# Patient Record
Sex: Female | Born: 1950 | Race: White | Hispanic: No | Marital: Married | Smoking: Former smoker
Health system: Southern US, Community
[De-identification: ages and names within clinical notes are randomized; demographics above are authoritative.]

## PROBLEM LIST (undated history)

## (undated) DIAGNOSIS — R7989 Other specified abnormal findings of blood chemistry: Secondary | ICD-10-CM

## (undated) DIAGNOSIS — K439 Ventral hernia without obstruction or gangrene: Secondary | ICD-10-CM

## (undated) DIAGNOSIS — D649 Anemia, unspecified: Secondary | ICD-10-CM

## (undated) DIAGNOSIS — N1330 Unspecified hydronephrosis: Secondary | ICD-10-CM

## (undated) DIAGNOSIS — C55 Malignant neoplasm of uterus, part unspecified: Secondary | ICD-10-CM

## (undated) DIAGNOSIS — L409 Psoriasis, unspecified: Secondary | ICD-10-CM

## (undated) DIAGNOSIS — M109 Gout, unspecified: Secondary | ICD-10-CM

## (undated) DIAGNOSIS — I639 Cerebral infarction, unspecified: Secondary | ICD-10-CM

## (undated) DIAGNOSIS — Q2730 Arteriovenous malformation, site unspecified: Secondary | ICD-10-CM

## (undated) DIAGNOSIS — G459 Transient cerebral ischemic attack, unspecified: Secondary | ICD-10-CM

## (undated) DIAGNOSIS — I509 Heart failure, unspecified: Secondary | ICD-10-CM

## (undated) DIAGNOSIS — H269 Unspecified cataract: Secondary | ICD-10-CM

## (undated) DIAGNOSIS — R911 Solitary pulmonary nodule: Secondary | ICD-10-CM

## (undated) DIAGNOSIS — K458 Other specified abdominal hernia without obstruction or gangrene: Secondary | ICD-10-CM

## (undated) DIAGNOSIS — J189 Pneumonia, unspecified organism: Secondary | ICD-10-CM

## (undated) DIAGNOSIS — IMO0002 Reserved for concepts with insufficient information to code with codable children: Secondary | ICD-10-CM

## (undated) DIAGNOSIS — J449 Chronic obstructive pulmonary disease, unspecified: Secondary | ICD-10-CM

## (undated) DIAGNOSIS — K219 Gastro-esophageal reflux disease without esophagitis: Secondary | ICD-10-CM

## (undated) DIAGNOSIS — G629 Polyneuropathy, unspecified: Secondary | ICD-10-CM

## (undated) DIAGNOSIS — I2510 Atherosclerotic heart disease of native coronary artery without angina pectoris: Secondary | ICD-10-CM

## (undated) DIAGNOSIS — E041 Nontoxic single thyroid nodule: Secondary | ICD-10-CM

## (undated) DIAGNOSIS — G4700 Insomnia, unspecified: Secondary | ICD-10-CM

## (undated) DIAGNOSIS — N3281 Overactive bladder: Secondary | ICD-10-CM

## (undated) DIAGNOSIS — M5136 Other intervertebral disc degeneration, lumbar region: Secondary | ICD-10-CM

## (undated) DIAGNOSIS — M51369 Other intervertebral disc degeneration, lumbar region without mention of lumbar back pain or lower extremity pain: Secondary | ICD-10-CM

## (undated) DIAGNOSIS — E785 Hyperlipidemia, unspecified: Secondary | ICD-10-CM

## (undated) DIAGNOSIS — F329 Major depressive disorder, single episode, unspecified: Secondary | ICD-10-CM

## (undated) DIAGNOSIS — C541 Malignant neoplasm of endometrium: Secondary | ICD-10-CM

## (undated) DIAGNOSIS — I82409 Acute embolism and thrombosis of unspecified deep veins of unspecified lower extremity: Secondary | ICD-10-CM

## (undated) DIAGNOSIS — I739 Peripheral vascular disease, unspecified: Secondary | ICD-10-CM

## (undated) DIAGNOSIS — IMO0001 Reserved for inherently not codable concepts without codable children: Secondary | ICD-10-CM

## (undated) DIAGNOSIS — M21379 Foot drop, unspecified foot: Secondary | ICD-10-CM

## (undated) DIAGNOSIS — F419 Anxiety disorder, unspecified: Secondary | ICD-10-CM

## (undated) DIAGNOSIS — D126 Benign neoplasm of colon, unspecified: Secondary | ICD-10-CM

## (undated) DIAGNOSIS — I6529 Occlusion and stenosis of unspecified carotid artery: Secondary | ICD-10-CM

## (undated) DIAGNOSIS — M1990 Unspecified osteoarthritis, unspecified site: Secondary | ICD-10-CM

## (undated) DIAGNOSIS — R05 Cough: Secondary | ICD-10-CM

## (undated) DIAGNOSIS — R059 Cough, unspecified: Secondary | ICD-10-CM

## (undated) DIAGNOSIS — R945 Abnormal results of liver function studies: Secondary | ICD-10-CM

## (undated) DIAGNOSIS — I10 Essential (primary) hypertension: Secondary | ICD-10-CM

## (undated) DIAGNOSIS — F32A Depression, unspecified: Secondary | ICD-10-CM

## (undated) HISTORY — DX: Cerebral infarction, unspecified: I63.9

## (undated) HISTORY — DX: Hyperlipidemia, unspecified: E78.5

## (undated) HISTORY — DX: Atherosclerotic heart disease of native coronary artery without angina pectoris: I25.10

## (undated) HISTORY — DX: Anxiety disorder, unspecified: F41.9

## (undated) HISTORY — DX: Unspecified osteoarthritis, unspecified site: M19.90

## (undated) HISTORY — DX: Gastro-esophageal reflux disease without esophagitis: K21.9

## (undated) HISTORY — DX: Benign neoplasm of colon, unspecified: D12.6

## (undated) HISTORY — DX: Overactive bladder: N32.81

## (undated) HISTORY — DX: Unspecified hydronephrosis: N13.30

## (undated) HISTORY — PX: BREAST BIOPSY: SHX20

## (undated) HISTORY — DX: Nontoxic single thyroid nodule: E04.1

## (undated) HISTORY — DX: Depression, unspecified: F32.A

## (undated) HISTORY — DX: Transient cerebral ischemic attack, unspecified: G45.9

## (undated) HISTORY — DX: Chronic obstructive pulmonary disease, unspecified: J44.9

## (undated) HISTORY — DX: Psoriasis, unspecified: L40.9

## (undated) HISTORY — PX: SPINE SURGERY: SHX786

## (undated) HISTORY — DX: Ventral hernia without obstruction or gangrene: K43.9

## (undated) HISTORY — DX: Anemia, unspecified: D64.9

## (undated) HISTORY — DX: Other specified abdominal hernia without obstruction or gangrene: K45.8

## (undated) HISTORY — PX: CHOLECYSTECTOMY: SHX55

## (undated) HISTORY — DX: Occlusion and stenosis of unspecified carotid artery: I65.29

## (undated) HISTORY — DX: Abnormal results of liver function studies: R94.5

## (undated) HISTORY — DX: Reserved for concepts with insufficient information to code with codable children: IMO0002

## (undated) HISTORY — DX: Other intervertebral disc degeneration, lumbar region without mention of lumbar back pain or lower extremity pain: M51.369

## (undated) HISTORY — DX: Gout, unspecified: M10.9

## (undated) HISTORY — PX: BACK SURGERY: SHX140

## (undated) HISTORY — DX: Arteriovenous malformation, site unspecified: Q27.30

## (undated) HISTORY — DX: Other specified abnormal findings of blood chemistry: R79.89

## (undated) HISTORY — DX: Unspecified cataract: H26.9

## (undated) HISTORY — DX: Polyneuropathy, unspecified: G62.9

## (undated) HISTORY — DX: Insomnia, unspecified: G47.00

## (undated) HISTORY — DX: Other intervertebral disc degeneration, lumbar region: M51.36

## (undated) HISTORY — DX: Malignant neoplasm of uterus, part unspecified: C55

## (undated) HISTORY — DX: Solitary pulmonary nodule: R91.1

## (undated) HISTORY — DX: Reserved for inherently not codable concepts without codable children: IMO0001

## (undated) HISTORY — DX: Major depressive disorder, single episode, unspecified: F32.9

## (undated) HISTORY — PX: OTHER SURGICAL HISTORY: SHX169

---

## 1999-05-03 ENCOUNTER — Other Ambulatory Visit: Admission: RE | Admit: 1999-05-03 | Discharge: 1999-05-03 | Payer: Self-pay | Admitting: Family Medicine

## 2000-03-24 ENCOUNTER — Encounter: Admission: RE | Admit: 2000-03-24 | Discharge: 2000-03-24 | Payer: Self-pay | Admitting: Family Medicine

## 2000-03-24 ENCOUNTER — Encounter: Payer: Self-pay | Admitting: Family Medicine

## 2000-08-11 ENCOUNTER — Other Ambulatory Visit: Admission: RE | Admit: 2000-08-11 | Discharge: 2000-08-11 | Payer: Self-pay | Admitting: Family Medicine

## 2000-11-03 ENCOUNTER — Encounter: Payer: Self-pay | Admitting: Family Medicine

## 2000-11-03 ENCOUNTER — Encounter: Admission: RE | Admit: 2000-11-03 | Discharge: 2000-11-03 | Payer: Self-pay | Admitting: Family Medicine

## 2002-01-27 ENCOUNTER — Other Ambulatory Visit: Admission: RE | Admit: 2002-01-27 | Discharge: 2002-01-27 | Payer: Self-pay | Admitting: Family Medicine

## 2006-03-16 ENCOUNTER — Encounter: Admission: RE | Admit: 2006-03-16 | Discharge: 2006-03-16 | Payer: Self-pay | Admitting: Family Medicine

## 2007-12-30 HISTORY — PX: ABDOMINAL HYSTERECTOMY: SHX81

## 2008-03-31 ENCOUNTER — Other Ambulatory Visit: Admission: RE | Admit: 2008-03-31 | Discharge: 2008-03-31 | Payer: Self-pay | Admitting: Gynecology

## 2008-04-18 ENCOUNTER — Ambulatory Visit: Admission: RE | Admit: 2008-04-18 | Discharge: 2008-04-18 | Payer: Self-pay | Admitting: Gynecologic Oncology

## 2008-05-02 ENCOUNTER — Encounter: Payer: Self-pay | Admitting: Gynecologic Oncology

## 2008-05-02 ENCOUNTER — Inpatient Hospital Stay (HOSPITAL_COMMUNITY): Admission: AD | Admit: 2008-05-02 | Discharge: 2008-05-07 | Payer: Self-pay | Admitting: Obstetrics & Gynecology

## 2008-05-02 ENCOUNTER — Encounter: Payer: Self-pay | Admitting: Obstetrics & Gynecology

## 2008-05-09 ENCOUNTER — Ambulatory Visit: Admission: RE | Admit: 2008-05-09 | Discharge: 2008-05-09 | Payer: Self-pay | Admitting: Gynecology

## 2008-07-19 ENCOUNTER — Ambulatory Visit (HOSPITAL_COMMUNITY): Admission: RE | Admit: 2008-07-19 | Discharge: 2008-07-19 | Payer: Self-pay | Admitting: Gynecology

## 2008-08-01 ENCOUNTER — Ambulatory Visit: Admission: RE | Admit: 2008-08-01 | Discharge: 2008-08-01 | Payer: Self-pay | Admitting: Gynecologic Oncology

## 2008-08-01 ENCOUNTER — Ambulatory Visit: Payer: Self-pay | Admitting: Oncology

## 2008-08-18 LAB — WHOLE BLOOD GLUCOSE: HRS PC: 3 Hours

## 2008-08-25 LAB — CBC WITH DIFFERENTIAL/PLATELET
BASO%: 0.7 % (ref 0.0–2.0)
EOS%: 2.6 % (ref 0.0–7.0)
LYMPH%: 17.7 % (ref 14.0–48.0)
MCH: 29 pg (ref 26.0–34.0)
MCHC: 33.8 g/dL (ref 32.0–36.0)
MONO#: 0.2 10*3/uL (ref 0.1–0.9)
RBC: 4.62 10*6/uL (ref 3.70–5.32)
WBC: 5.7 10*3/uL (ref 3.9–10.0)
lymph#: 1 10*3/uL (ref 0.9–3.3)

## 2008-09-01 LAB — CBC WITH DIFFERENTIAL/PLATELET
Basophils Absolute: 0.1 10*3/uL (ref 0.0–0.1)
Eosinophils Absolute: 0.2 10*3/uL (ref 0.0–0.5)
HCT: 38.3 % (ref 34.8–46.6)
LYMPH%: 26.1 % (ref 14.0–48.0)
MONO#: 0.9 10*3/uL (ref 0.1–0.9)
NEUT#: 2.5 10*3/uL (ref 1.5–6.5)
NEUT%: 51.1 % (ref 39.6–76.8)
Platelets: 417 10*3/uL — ABNORMAL HIGH (ref 145–400)
WBC: 4.9 10*3/uL (ref 3.9–10.0)

## 2008-09-07 LAB — CBC WITH DIFFERENTIAL/PLATELET
BASO%: 0.4 % (ref 0.0–2.0)
EOS%: 1.1 % (ref 0.0–7.0)
HCT: 37.6 % (ref 34.8–46.6)
LYMPH%: 13.1 % — ABNORMAL LOW (ref 14.0–48.0)
MCH: 29.1 pg (ref 26.0–34.0)
MCHC: 33.9 g/dL (ref 32.0–36.0)
MCV: 86 fL (ref 81.0–101.0)
MONO#: 0.8 10*3/uL (ref 0.1–0.9)
NEUT%: 76.9 % — ABNORMAL HIGH (ref 39.6–76.8)
Platelets: 413 10*3/uL — ABNORMAL HIGH (ref 145–400)

## 2008-09-07 LAB — COMPREHENSIVE METABOLIC PANEL
ALT: 21 U/L (ref 0–35)
CO2: 26 mEq/L (ref 19–32)
Creatinine, Ser: 0.79 mg/dL (ref 0.40–1.20)
Total Bilirubin: 0.2 mg/dL — ABNORMAL LOW (ref 0.3–1.2)

## 2008-09-08 LAB — WHOLE BLOOD GLUCOSE: Glucose: 329 mg/dL — ABNORMAL HIGH (ref 70–100)

## 2008-09-15 ENCOUNTER — Ambulatory Visit: Payer: Self-pay | Admitting: Oncology

## 2008-09-19 LAB — CBC WITH DIFFERENTIAL/PLATELET
Basophils Absolute: 0 10*3/uL (ref 0.0–0.1)
Eosinophils Absolute: 0.1 10*3/uL (ref 0.0–0.5)
HGB: 13 g/dL (ref 11.6–15.9)
MCV: 85.9 fL (ref 81.0–101.0)
MONO#: 0.8 10*3/uL (ref 0.1–0.9)
MONO%: 12.1 % (ref 0.0–13.0)
NEUT#: 4.3 10*3/uL (ref 1.5–6.5)
RDW: 15.9 % — ABNORMAL HIGH (ref 11.3–14.5)
lymph#: 1.2 10*3/uL (ref 0.9–3.3)

## 2008-09-19 LAB — COMPREHENSIVE METABOLIC PANEL
Albumin: 4.2 g/dL (ref 3.5–5.2)
BUN: 22 mg/dL (ref 6–23)
CO2: 26 mEq/L (ref 19–32)
Calcium: 9.3 mg/dL (ref 8.4–10.5)
Chloride: 101 mEq/L (ref 96–112)
Glucose, Bld: 235 mg/dL — ABNORMAL HIGH (ref 70–99)
Potassium: 3.9 mEq/L (ref 3.5–5.3)

## 2008-10-02 LAB — CBC WITH DIFFERENTIAL/PLATELET
Basophils Absolute: 0 10*3/uL (ref 0.0–0.1)
EOS%: 0.1 % (ref 0.0–7.0)
Eosinophils Absolute: 0 10*3/uL (ref 0.0–0.5)
LYMPH%: 8.3 % — ABNORMAL LOW (ref 14.0–48.0)
MCH: 28.6 pg (ref 26.0–34.0)
MCV: 84.6 fL (ref 81.0–101.0)
MONO%: 0.5 % (ref 0.0–13.0)
NEUT#: 10.2 10*3/uL — ABNORMAL HIGH (ref 1.5–6.5)
Platelets: 487 10*3/uL — ABNORMAL HIGH (ref 145–400)
RBC: 4.55 10*6/uL (ref 3.70–5.32)

## 2008-10-20 ENCOUNTER — Ambulatory Visit (HOSPITAL_COMMUNITY): Admission: RE | Admit: 2008-10-20 | Discharge: 2008-10-20 | Payer: Self-pay | Admitting: Oncology

## 2008-10-24 ENCOUNTER — Ambulatory Visit: Admission: RE | Admit: 2008-10-24 | Discharge: 2008-10-24 | Payer: Self-pay | Admitting: Gynecologic Oncology

## 2008-10-27 ENCOUNTER — Ambulatory Visit: Admission: RE | Admit: 2008-10-27 | Discharge: 2009-01-10 | Payer: Self-pay | Admitting: Radiation Oncology

## 2008-11-22 LAB — URINALYSIS, MICROSCOPIC - CHCC
Glucose: NEGATIVE g/dL
Nitrite: POSITIVE
Protein: NEGATIVE mg/dL
Specific Gravity, Urine: 1.015 (ref 1.003–1.035)

## 2009-01-15 ENCOUNTER — Ambulatory Visit: Payer: Self-pay | Admitting: Oncology

## 2009-01-17 ENCOUNTER — Ambulatory Visit (HOSPITAL_COMMUNITY): Admission: RE | Admit: 2009-01-17 | Discharge: 2009-01-17 | Payer: Self-pay | Admitting: Oncology

## 2009-01-26 LAB — CBC WITH DIFFERENTIAL/PLATELET
BASO%: 0.2 % (ref 0.0–2.0)
Eosinophils Absolute: 0 10*3/uL (ref 0.0–0.5)
LYMPH%: 5 % — ABNORMAL LOW (ref 14.0–48.0)
MCHC: 34.9 g/dL (ref 32.0–36.0)
MONO#: 0 10*3/uL — ABNORMAL LOW (ref 0.1–0.9)
NEUT#: 7.7 10*3/uL — ABNORMAL HIGH (ref 1.5–6.5)
RBC: 3.6 10*6/uL — ABNORMAL LOW (ref 3.70–5.32)
RDW: 15.3 % — ABNORMAL HIGH (ref 11.3–14.5)
WBC: 8.2 10*3/uL (ref 3.9–10.0)
lymph#: 0.4 10*3/uL — ABNORMAL LOW (ref 0.9–3.3)

## 2009-01-26 LAB — WHOLE BLOOD GLUCOSE
Glucose: 347 mg/dL — ABNORMAL HIGH (ref 70–100)
Glucose: 279 mg/dL — ABNORMAL HIGH (ref 70–100)
HRS PC: 10 Hours
HRS PC: 2 Hours

## 2009-02-05 LAB — CBC WITH DIFFERENTIAL/PLATELET
Eosinophils Absolute: 0.1 10*3/uL (ref 0.0–0.5)
LYMPH%: 9.9 % — ABNORMAL LOW (ref 14.0–48.0)
MONO#: 0.5 10*3/uL (ref 0.1–0.9)
NEUT#: 2.3 10*3/uL (ref 1.5–6.5)
Platelets: 353 10*3/uL (ref 145–400)
RBC: 3.46 10*6/uL — ABNORMAL LOW (ref 3.70–5.32)
RDW: 15.3 % — ABNORMAL HIGH (ref 11.3–14.5)
WBC: 3.2 10*3/uL — ABNORMAL LOW (ref 3.9–10.0)
lymph#: 0.3 10*3/uL — ABNORMAL LOW (ref 0.9–3.3)

## 2009-02-05 LAB — COMPREHENSIVE METABOLIC PANEL
ALT: 14 U/L (ref 0–35)
Albumin: 4.1 g/dL (ref 3.5–5.2)
CO2: 28 mEq/L (ref 19–32)
Calcium: 8.7 mg/dL (ref 8.4–10.5)
Chloride: 101 mEq/L (ref 96–112)
Glucose, Bld: 135 mg/dL — ABNORMAL HIGH (ref 70–99)
Potassium: 3.5 mEq/L (ref 3.5–5.3)
Sodium: 140 mEq/L (ref 135–145)
Total Protein: 6.4 g/dL (ref 6.0–8.3)

## 2009-02-06 ENCOUNTER — Ambulatory Visit (HOSPITAL_COMMUNITY): Admission: RE | Admit: 2009-02-06 | Discharge: 2009-02-06 | Payer: Self-pay | Admitting: Family Medicine

## 2009-02-12 ENCOUNTER — Ambulatory Visit: Admission: RE | Admit: 2009-02-12 | Discharge: 2009-02-12 | Payer: Self-pay | Admitting: Radiation Oncology

## 2009-02-12 LAB — URINALYSIS, MICROSCOPIC - CHCC
Nitrite: POSITIVE
Protein: NEGATIVE mg/dL
Specific Gravity, Urine: 1.01 (ref 1.003–1.035)
pH: 5 (ref 4.6–8.0)

## 2009-02-16 LAB — CBC WITH DIFFERENTIAL/PLATELET
BASO%: 0.1 % (ref 0.0–2.0)
Basophils Absolute: 0 10*3/uL (ref 0.0–0.1)
EOS%: 0.1 % (ref 0.0–7.0)
HCT: 34.1 % — ABNORMAL LOW (ref 34.8–46.6)
HGB: 11.8 g/dL (ref 11.6–15.9)
MCH: 32.7 pg (ref 25.1–34.0)
MONO#: 0 10*3/uL — ABNORMAL LOW (ref 0.1–0.9)
NEUT%: 95.7 % — ABNORMAL HIGH (ref 38.4–76.8)
RDW: 14.9 % — ABNORMAL HIGH (ref 11.2–14.5)
WBC: 10.1 10*3/uL (ref 3.9–10.3)
lymph#: 0.4 10*3/uL — ABNORMAL LOW (ref 0.9–3.3)

## 2009-02-16 LAB — WHOLE BLOOD GLUCOSE
Glucose: 405 mg/dL — ABNORMAL HIGH (ref 70–100)
HRS PC: 1 Hours

## 2009-02-23 LAB — CBC WITH DIFFERENTIAL/PLATELET
BASO%: 0.1 % (ref 0.0–2.0)
Eosinophils Absolute: 0 10*3/uL (ref 0.0–0.5)
LYMPH%: 4.3 % — ABNORMAL LOW (ref 14.0–49.7)
MCHC: 34.2 g/dL (ref 31.5–36.0)
MONO#: 0 10*3/uL — ABNORMAL LOW (ref 0.1–0.9)
NEUT#: 8.8 10*3/uL — ABNORMAL HIGH (ref 1.5–6.5)
Platelets: 280 10*3/uL (ref 145–400)
RBC: 3.71 10*6/uL (ref 3.70–5.45)
WBC: 9.2 10*3/uL (ref 3.9–10.3)
lymph#: 0.4 10*3/uL — ABNORMAL LOW (ref 0.9–3.3)
nRBC: 0 % (ref 0–0)

## 2009-02-23 LAB — WHOLE BLOOD GLUCOSE
Glucose: 344 mg/dL — ABNORMAL HIGH (ref 70–100)
HRS PC: 1 Hours
HRS PC: 0.5 Hours

## 2009-03-07 ENCOUNTER — Ambulatory Visit: Payer: Self-pay | Admitting: Oncology

## 2009-03-09 LAB — CBC WITH DIFFERENTIAL/PLATELET
Eosinophils Absolute: 0.1 10*3/uL (ref 0.0–0.5)
HCT: 31.6 % — ABNORMAL LOW (ref 34.8–46.6)
LYMPH%: 12 % — ABNORMAL LOW (ref 14.0–49.7)
MCHC: 33.5 g/dL (ref 31.5–36.0)
MCV: 92.9 fL (ref 79.5–101.0)
MONO#: 0.7 10*3/uL (ref 0.1–0.9)
MONO%: 31.1 % — ABNORMAL HIGH (ref 0.0–14.0)
NEUT#: 1.1 10*3/uL — ABNORMAL LOW (ref 1.5–6.5)
NEUT%: 49.8 % (ref 38.4–76.8)
Platelets: 320 10*3/uL (ref 145–400)
RBC: 3.4 10*6/uL — ABNORMAL LOW (ref 3.70–5.45)
WBC: 2.3 10*3/uL — ABNORMAL LOW (ref 3.9–10.3)

## 2009-03-09 LAB — COMPREHENSIVE METABOLIC PANEL
CO2: 27 mEq/L (ref 19–32)
Creatinine, Ser: 0.93 mg/dL (ref 0.40–1.20)
Glucose, Bld: 138 mg/dL — ABNORMAL HIGH (ref 70–99)
Total Bilirubin: 0.2 mg/dL — ABNORMAL LOW (ref 0.3–1.2)

## 2009-03-16 LAB — CBC WITH DIFFERENTIAL/PLATELET
Basophils Absolute: 0 10*3/uL (ref 0.0–0.1)
EOS%: 0 % (ref 0.0–7.0)
Eosinophils Absolute: 0 10*3/uL (ref 0.0–0.5)
HGB: 11.5 g/dL — ABNORMAL LOW (ref 11.6–15.9)
MCH: 31.2 pg (ref 25.1–34.0)
NEUT#: 9.6 10*3/uL — ABNORMAL HIGH (ref 1.5–6.5)
RDW: 14.2 % (ref 11.2–14.5)
lymph#: 0.4 10*3/uL — ABNORMAL LOW (ref 0.9–3.3)
nRBC: 0 % (ref 0–0)

## 2009-03-16 LAB — WHOLE BLOOD GLUCOSE
Glucose: 415 mg/dL — ABNORMAL HIGH (ref 70–100)
HRS PC: 45 Hours

## 2009-04-04 LAB — CBC WITH DIFFERENTIAL/PLATELET
BASO%: 0.1 % (ref 0.0–2.0)
EOS%: 1.4 % (ref 0.0–7.0)
HCT: 28.6 % — ABNORMAL LOW (ref 34.8–46.6)
LYMPH%: 5.9 % — ABNORMAL LOW (ref 14.0–49.7)
MCH: 33.4 pg (ref 25.1–34.0)
MCHC: 35 g/dL (ref 31.5–36.0)
MCV: 95.3 fL (ref 79.5–101.0)
MONO%: 12.2 % (ref 0.0–14.0)
NEUT%: 80.4 % — ABNORMAL HIGH (ref 38.4–76.8)
Platelets: 421 10*3/uL — ABNORMAL HIGH (ref 145–400)
RBC: 3.01 10*6/uL — ABNORMAL LOW (ref 3.70–5.45)
WBC: 5.4 10*3/uL (ref 3.9–10.3)

## 2009-04-04 LAB — COMPREHENSIVE METABOLIC PANEL
ALT: 13 U/L (ref 0–35)
Alkaline Phosphatase: 31 U/L — ABNORMAL LOW (ref 39–117)
CO2: 26 mEq/L (ref 19–32)
Creatinine, Ser: 0.93 mg/dL (ref 0.40–1.20)
Sodium: 140 mEq/L (ref 135–145)
Total Bilirubin: 0.2 mg/dL — ABNORMAL LOW (ref 0.3–1.2)
Total Protein: 6.1 g/dL (ref 6.0–8.3)

## 2009-04-13 ENCOUNTER — Ambulatory Visit (HOSPITAL_COMMUNITY): Admission: RE | Admit: 2009-04-13 | Discharge: 2009-04-13 | Payer: Self-pay | Admitting: Oncology

## 2009-04-25 ENCOUNTER — Encounter: Payer: Self-pay | Admitting: Gynecologic Oncology

## 2009-04-25 ENCOUNTER — Ambulatory Visit: Admission: RE | Admit: 2009-04-25 | Discharge: 2009-04-25 | Payer: Self-pay | Admitting: Gynecologic Oncology

## 2009-04-25 ENCOUNTER — Other Ambulatory Visit: Admission: RE | Admit: 2009-04-25 | Discharge: 2009-04-25 | Payer: Self-pay | Admitting: Gynecologic Oncology

## 2009-05-07 ENCOUNTER — Encounter: Admission: RE | Admit: 2009-05-07 | Discharge: 2009-05-07 | Payer: Self-pay | Admitting: Family Medicine

## 2009-05-08 ENCOUNTER — Encounter: Admission: RE | Admit: 2009-05-08 | Discharge: 2009-05-08 | Payer: Self-pay | Admitting: Family Medicine

## 2009-05-10 ENCOUNTER — Encounter: Admission: RE | Admit: 2009-05-10 | Discharge: 2009-05-10 | Payer: Self-pay | Admitting: Family Medicine

## 2009-06-01 ENCOUNTER — Encounter: Admission: RE | Admit: 2009-06-01 | Discharge: 2009-06-01 | Payer: Self-pay | Admitting: Family Medicine

## 2009-07-19 ENCOUNTER — Ambulatory Visit: Payer: Self-pay | Admitting: Oncology

## 2009-07-24 LAB — COMPREHENSIVE METABOLIC PANEL
ALT: 13 U/L (ref 0–35)
AST: 14 U/L (ref 0–37)
Alkaline Phosphatase: 32 U/L — ABNORMAL LOW (ref 39–117)
CO2: 25 mEq/L (ref 19–32)
Creatinine, Ser: 0.87 mg/dL (ref 0.40–1.20)
Sodium: 142 mEq/L (ref 135–145)
Total Bilirubin: 0.3 mg/dL (ref 0.3–1.2)
Total Protein: 6.1 g/dL (ref 6.0–8.3)

## 2009-07-24 LAB — CBC WITH DIFFERENTIAL/PLATELET
BASO%: 0.1 % (ref 0.0–2.0)
EOS%: 1.3 % (ref 0.0–7.0)
LYMPH%: 7.7 % — ABNORMAL LOW (ref 14.0–49.7)
MCH: 32.4 pg (ref 25.1–34.0)
MCHC: 34.5 g/dL (ref 31.5–36.0)
MONO#: 0.4 10*3/uL (ref 0.1–0.9)
Platelets: 359 10*3/uL (ref 145–400)
RBC: 3.89 10*6/uL (ref 3.70–5.45)
WBC: 5 10*3/uL (ref 3.9–10.3)

## 2009-08-27 ENCOUNTER — Ambulatory Visit: Admission: RE | Admit: 2009-08-27 | Discharge: 2009-08-27 | Payer: Self-pay | Admitting: Radiation Oncology

## 2009-08-27 ENCOUNTER — Other Ambulatory Visit: Admission: RE | Admit: 2009-08-27 | Discharge: 2009-08-27 | Payer: Self-pay | Admitting: Radiation Oncology

## 2009-08-27 ENCOUNTER — Encounter: Payer: Self-pay | Admitting: Radiation Oncology

## 2009-12-26 ENCOUNTER — Other Ambulatory Visit: Admission: RE | Admit: 2009-12-26 | Discharge: 2009-12-26 | Payer: Self-pay | Admitting: Gynecologic Oncology

## 2009-12-26 ENCOUNTER — Ambulatory Visit: Admission: RE | Admit: 2009-12-26 | Discharge: 2009-12-26 | Payer: Self-pay | Admitting: Gynecologic Oncology

## 2010-02-20 ENCOUNTER — Encounter: Admission: RE | Admit: 2010-02-20 | Discharge: 2010-02-20 | Payer: Self-pay | Admitting: Family Medicine

## 2010-02-25 ENCOUNTER — Other Ambulatory Visit: Admission: RE | Admit: 2010-02-25 | Discharge: 2010-02-25 | Payer: Self-pay | Admitting: Radiation Oncology

## 2010-02-25 ENCOUNTER — Ambulatory Visit: Admission: RE | Admit: 2010-02-25 | Discharge: 2010-02-25 | Payer: Self-pay | Admitting: Radiation Oncology

## 2010-04-03 ENCOUNTER — Ambulatory Visit (HOSPITAL_COMMUNITY): Admission: RE | Admit: 2010-04-03 | Discharge: 2010-04-03 | Payer: Self-pay | Admitting: Family Medicine

## 2010-05-06 ENCOUNTER — Encounter: Admission: RE | Admit: 2010-05-06 | Discharge: 2010-05-15 | Payer: Self-pay | Admitting: Family Medicine

## 2010-06-06 ENCOUNTER — Encounter: Admission: RE | Admit: 2010-06-06 | Discharge: 2010-06-06 | Payer: Self-pay | Admitting: Family Medicine

## 2010-06-07 ENCOUNTER — Encounter: Admission: RE | Admit: 2010-06-07 | Discharge: 2010-06-07 | Payer: Self-pay | Admitting: Family Medicine

## 2010-06-19 ENCOUNTER — Encounter: Admission: RE | Admit: 2010-06-19 | Discharge: 2010-06-19 | Payer: Self-pay | Admitting: Family Medicine

## 2010-06-27 ENCOUNTER — Ambulatory Visit: Admission: RE | Admit: 2010-06-27 | Discharge: 2010-06-27 | Payer: Self-pay | Admitting: Gynecologic Oncology

## 2010-08-26 ENCOUNTER — Ambulatory Visit: Admission: RE | Admit: 2010-08-26 | Discharge: 2010-08-26 | Payer: Self-pay | Admitting: Radiation Oncology

## 2010-08-26 ENCOUNTER — Other Ambulatory Visit: Admission: RE | Admit: 2010-08-26 | Discharge: 2010-08-26 | Payer: Self-pay | Admitting: Radiation Oncology

## 2010-09-27 ENCOUNTER — Encounter: Admission: RE | Admit: 2010-09-27 | Discharge: 2010-09-27 | Payer: Self-pay | Admitting: Family Medicine

## 2010-11-13 ENCOUNTER — Ambulatory Visit
Admission: RE | Admit: 2010-11-13 | Discharge: 2010-11-13 | Payer: Self-pay | Source: Home / Self Care | Admitting: Gynecologic Oncology

## 2010-12-02 ENCOUNTER — Encounter: Admission: RE | Admit: 2010-12-02 | Discharge: 2010-12-02 | Payer: Self-pay | Admitting: Family Medicine

## 2011-02-10 ENCOUNTER — Other Ambulatory Visit: Payer: Self-pay | Admitting: Family Medicine

## 2011-02-10 DIAGNOSIS — M5416 Radiculopathy, lumbar region: Secondary | ICD-10-CM

## 2011-02-13 ENCOUNTER — Ambulatory Visit
Admission: RE | Admit: 2011-02-13 | Discharge: 2011-02-13 | Disposition: A | Discharge status: Home / Self Care | Payer: Medicare Other | Source: Ambulatory Visit | Attending: Family Medicine | Admitting: Family Medicine

## 2011-02-13 DIAGNOSIS — M5416 Radiculopathy, lumbar region: Secondary | ICD-10-CM

## 2011-02-28 ENCOUNTER — Other Ambulatory Visit: Payer: Self-pay | Admitting: Family Medicine

## 2011-02-28 DIAGNOSIS — M545 Low back pain, unspecified: Secondary | ICD-10-CM

## 2011-03-03 ENCOUNTER — Other Ambulatory Visit: Payer: Self-pay | Admitting: Family Medicine

## 2011-03-03 DIAGNOSIS — M5416 Radiculopathy, lumbar region: Secondary | ICD-10-CM

## 2011-03-04 ENCOUNTER — Ambulatory Visit
Admission: RE | Admit: 2011-03-04 | Discharge: 2011-03-04 | Disposition: A | Discharge status: Home / Self Care | Payer: Medicare Other | Source: Ambulatory Visit | Attending: Family Medicine | Admitting: Family Medicine

## 2011-03-04 DIAGNOSIS — M545 Low back pain: Secondary | ICD-10-CM

## 2011-03-06 ENCOUNTER — Encounter: Payer: Self-pay | Admitting: Cardiovascular Disease

## 2011-03-11 ENCOUNTER — Other Ambulatory Visit: Payer: Self-pay | Admitting: Gynecologic Oncology

## 2011-03-11 ENCOUNTER — Ambulatory Visit: Payer: Medicare Other | Attending: Gynecologic Oncology | Admitting: Gynecologic Oncology

## 2011-03-11 ENCOUNTER — Encounter (HOSPITAL_BASED_OUTPATIENT_CLINIC_OR_DEPARTMENT_OTHER): Payer: Medicare Other | Admitting: Oncology

## 2011-03-11 ENCOUNTER — Other Ambulatory Visit: Payer: Self-pay | Admitting: Oncology

## 2011-03-11 DIAGNOSIS — Z79899 Other long term (current) drug therapy: Secondary | ICD-10-CM | POA: Insufficient documentation

## 2011-03-11 DIAGNOSIS — I358 Other nonrheumatic aortic valve disorders: Secondary | ICD-10-CM

## 2011-03-11 DIAGNOSIS — C549 Malignant neoplasm of corpus uteri, unspecified: Secondary | ICD-10-CM | POA: Insufficient documentation

## 2011-03-11 DIAGNOSIS — K432 Incisional hernia without obstruction or gangrene: Secondary | ICD-10-CM | POA: Insufficient documentation

## 2011-03-11 DIAGNOSIS — R52 Pain, unspecified: Secondary | ICD-10-CM

## 2011-03-11 DIAGNOSIS — C55 Malignant neoplasm of uterus, part unspecified: Secondary | ICD-10-CM

## 2011-03-11 DIAGNOSIS — L408 Other psoriasis: Secondary | ICD-10-CM | POA: Insufficient documentation

## 2011-03-11 LAB — BASIC METABOLIC PANEL
CO2: 25 mEq/L (ref 19–32)
Calcium: 9.5 mg/dL (ref 8.4–10.5)
Sodium: 138 mEq/L (ref 135–145)

## 2011-03-12 ENCOUNTER — Ambulatory Visit
Admission: RE | Admit: 2011-03-12 | Discharge: 2011-03-12 | Disposition: A | Discharge status: Home / Self Care | Payer: Medicare Other | Source: Ambulatory Visit | Attending: Family Medicine | Admitting: Family Medicine

## 2011-03-12 ENCOUNTER — Other Ambulatory Visit: Payer: Self-pay | Admitting: Gynecologic Oncology

## 2011-03-12 DIAGNOSIS — C541 Malignant neoplasm of endometrium: Secondary | ICD-10-CM

## 2011-03-12 DIAGNOSIS — M5416 Radiculopathy, lumbar region: Secondary | ICD-10-CM

## 2011-03-13 ENCOUNTER — Ambulatory Visit (HOSPITAL_COMMUNITY)
Admission: RE | Admit: 2011-03-13 | Discharge: 2011-03-13 | Disposition: A | Discharge status: Home / Self Care | Payer: Medicare Other | Source: Ambulatory Visit | Attending: Gynecologic Oncology | Admitting: Gynecologic Oncology

## 2011-03-13 ENCOUNTER — Encounter (HOSPITAL_COMMUNITY): Payer: Self-pay

## 2011-03-13 DIAGNOSIS — K7689 Other specified diseases of liver: Secondary | ICD-10-CM | POA: Insufficient documentation

## 2011-03-13 DIAGNOSIS — R1909 Other intra-abdominal and pelvic swelling, mass and lump: Secondary | ICD-10-CM | POA: Insufficient documentation

## 2011-03-13 DIAGNOSIS — K439 Ventral hernia without obstruction or gangrene: Secondary | ICD-10-CM | POA: Insufficient documentation

## 2011-03-13 DIAGNOSIS — C549 Malignant neoplasm of corpus uteri, unspecified: Secondary | ICD-10-CM | POA: Insufficient documentation

## 2011-03-13 DIAGNOSIS — C541 Malignant neoplasm of endometrium: Secondary | ICD-10-CM

## 2011-03-13 DIAGNOSIS — M549 Dorsalgia, unspecified: Secondary | ICD-10-CM | POA: Insufficient documentation

## 2011-03-13 HISTORY — DX: Essential (primary) hypertension: I10

## 2011-03-13 HISTORY — DX: Malignant neoplasm of endometrium: C54.1

## 2011-03-13 MED ORDER — IOHEXOL 300 MG/ML  SOLN
100.0000 mL | Freq: Once | INTRAMUSCULAR | Status: AC | PRN
  Administered 2011-03-13: 100 mL via INTRAVENOUS

## 2011-03-19 LAB — BLOOD GAS, ARTERIAL
FIO2: 0.21 %
pCO2 arterial: 37.9 mmHg (ref 35.0–45.0)
pH, Arterial: 7.413 — ABNORMAL HIGH (ref 7.350–7.400)
pO2, Arterial: 75.8 mmHg — ABNORMAL LOW (ref 80.0–100.0)

## 2011-03-21 NOTE — Consult Note (Signed)
Lauren [name], Lauren [name]             ACCOUNT NO.:  [id number]  MEDICAL RECORD NO.:  [id number]           PATIENT TYPE:  LOCATION:                                 FACILITY:  PHYSICIAN:  Johne Buckle A. Duard Brady, MD    DATE OF BIRTH:  1951-11-27  DATE OF CONSULTATION:  03/11/2011 DATE OF DISCHARGE:                                CONSULTATION   HISTORY:  Lauren [name] is a 60 year old with stage IIIC endometrioid adenocarcinoma who underwent exploratory laparotomy, TAH-BSO, and appropriate staging in May 2009.  Final pathology revealed a grade 1 lesion involving the outer half of the myometrium with LVSI.  She had negative washings, negative adnexa; however, 2 out of 7 lymph nodes are positive for metastatic cancer with extranodal extension in 2 of the lymph nodes.  She underwent chemotherapy and radiation under the "sandwich protocol" and had negative posttreatment imaging in October 2010.  She was supposed to be getting repeat CT scans on an annual basis, but has declined.  When she saw Dr. Nelly Rout in June 2011 and me in November 2011, she declined imaging at each of those time points. She most recently was seen by her primary physician, Dr. Duaine Dredge for evaluation of increased back pain.  Findings of the MRI of the spine performed on March 6th revealed a new 3.5 x 3 x 3.19 cm lesion in the left periaortic region extending from L3 to L4 disk space to the inferior aspect of L4 vertebra with mass effect on the aorta consistent with metastatic disease.  At L2-3, she has moderate DJD.  L3-4 is negative.  L4-5, she has moderate bilateral facet joint degeneration, which is greater on the left.  She has ligamentum flap on hypertrophy. L5-S1, she has got DJD.  She comes in today for followup of these. There is no other evidence of metastatic disease.  She has quit smoking, has not smoked in 16 days, she is on Chantix.  She is trying it again even though she continues to have bad dreams like she did  the last time she used it, she does fall out of bed, her husband has had to put a guard rail up.  She is due for an epidural injection tomorrow for her back pain.  She otherwise denies any complaints.  She denies any vaginal bleeding; any change in bowel or bladder habits; any nausea, vomiting, fever, chills, headaches, visual changes, unintentional weight loss or weight gain.  MEDICATION LIST:  Reviewed and include, 1. Losartan 100 mg daily. 2. Terazosin 10 mg daily. 3. Atenolol 25 twice daily. 4. Metformin 1000 twice daily. 5. Lantus insulin. 6. Potassium. 7. Chlorthalidone 25 daily. 8. Nexium 40 daily. 9. Paroxetine 20 daily. 10.Albuterol inhaler. 11.Chantix.  PHYSICAL EXAMINATION:  VITAL SIGNS:  Weight 228 pounds, height 5 feet 6 inches for a BMI of 36.  Blood pressure 120/68, pulse 68. GENERAL:  A well-nourished, well-developed female, in no acute distress. NECK:  Supple.  There is no lymphadenopathy, no thyromegaly. ABDOMEN:  A very large incisional hernia.  Abdomen is obese, soft, and nontender.  There are no masses, but exam is limited by her habitus  as well as a size of the hernia.  Groins are negative for adenopathy. EXTREMITIES:  There is no edema.  She has psoriasis.  Bimanual examination reveals no masses or nodularity.  ASSESSMENT: 16. A 60 year old with probable recurrent stage IIIC endometrial     carcinoma in a large periaortic lymph node.  We do not know at this     time if she has any other extranodal disease.  She needs imaging.     She does agree to a CT scan and will check a CT scan of the chest,     abdomen, and pelvis.  We will check a creatinine today.  I also     discussed with her that she will need to hold her metformin post CT     she states she remembers that as she had to do that before.  She     would like for [place] to call her with the results of the CT scan.  If     the CT reveals disease only in this area, the patient states she     does not  want to lose her hair again and I think we could just see     if this is above the previous radiation field and speak to Dr.     Roselind Messier about re-radiating or irradiating this area.  We may be able     to do some platinum or weekly Taxol chemosensitization, which would     not cause any alopecia.  If there is evidence of other disease     other than this area, unfortunately we will need to proceed with     chemotherapy. 2. I do not feel that surgery at this time will be particularly easy     for this patient, this is a fairly high nodal area and due to her     morbid obesity may be difficult to resect, but that is also an     option.  I will contact the patient once the results of the CT     available.     Tyrez Berrios A. Duard Brady, MD     PAG/MEDQ  D:  03/11/2011  T:  03/12/2011  Job:  [id number]  cc:   Lennis P. Darrold Span, M.D. Fax: [phone]  Billie Lade, Ph.D., M.D. Fax: [phone]  Telford Nab, R.N. [address] [place], [place] [address]  Quita Skye. Artis Flock, M.D. Fax: [phone]  Mosetta Putt, M.D. Fax: [phone]  Electronically Signed by Cleda Mccreedy MD on 03/17/2011 08:58:03 AM

## 2011-03-26 ENCOUNTER — Ambulatory Visit: Payer: Medicare Other | Attending: Radiation Oncology | Admitting: Radiation Oncology

## 2011-03-26 DIAGNOSIS — M545 Low back pain, unspecified: Secondary | ICD-10-CM | POA: Insufficient documentation

## 2011-03-26 DIAGNOSIS — Z510 Encounter for antineoplastic radiation therapy: Secondary | ICD-10-CM | POA: Insufficient documentation

## 2011-03-26 DIAGNOSIS — R110 Nausea: Secondary | ICD-10-CM | POA: Insufficient documentation

## 2011-03-26 DIAGNOSIS — R197 Diarrhea, unspecified: Secondary | ICD-10-CM | POA: Insufficient documentation

## 2011-03-26 DIAGNOSIS — C549 Malignant neoplasm of corpus uteri, unspecified: Secondary | ICD-10-CM | POA: Insufficient documentation

## 2011-03-26 DIAGNOSIS — Y842 Radiological procedure and radiotherapy as the cause of abnormal reaction of the patient, or of later complication, without mention of misadventure at the time of the procedure: Secondary | ICD-10-CM | POA: Insufficient documentation

## 2011-03-31 ENCOUNTER — Other Ambulatory Visit: Payer: Self-pay | Admitting: Radiation Oncology

## 2011-03-31 DIAGNOSIS — C541 Malignant neoplasm of endometrium: Secondary | ICD-10-CM

## 2011-04-09 ENCOUNTER — Other Ambulatory Visit: Payer: Self-pay | Admitting: Radiation Oncology

## 2011-04-09 ENCOUNTER — Other Ambulatory Visit: Payer: Self-pay | Admitting: Interventional Radiology

## 2011-04-09 ENCOUNTER — Ambulatory Visit (HOSPITAL_COMMUNITY): Payer: Medicare Other

## 2011-04-09 ENCOUNTER — Ambulatory Visit (HOSPITAL_COMMUNITY)
Admission: RE | Admit: 2011-04-09 | Discharge: 2011-04-09 | Disposition: A | Discharge status: Home / Self Care | Payer: Medicare Other | Source: Ambulatory Visit | Attending: Radiation Oncology | Admitting: Radiation Oncology

## 2011-04-09 DIAGNOSIS — Z79899 Other long term (current) drug therapy: Secondary | ICD-10-CM | POA: Insufficient documentation

## 2011-04-09 DIAGNOSIS — Z794 Long term (current) use of insulin: Secondary | ICD-10-CM | POA: Insufficient documentation

## 2011-04-09 DIAGNOSIS — R222 Localized swelling, mass and lump, trunk: Secondary | ICD-10-CM | POA: Insufficient documentation

## 2011-04-09 DIAGNOSIS — M7989 Other specified soft tissue disorders: Secondary | ICD-10-CM | POA: Insufficient documentation

## 2011-04-09 DIAGNOSIS — Z8542 Personal history of malignant neoplasm of other parts of uterus: Secondary | ICD-10-CM | POA: Insufficient documentation

## 2011-04-09 DIAGNOSIS — M47817 Spondylosis without myelopathy or radiculopathy, lumbosacral region: Secondary | ICD-10-CM | POA: Insufficient documentation

## 2011-04-09 LAB — CBC
Platelets: 406 10*3/uL — ABNORMAL HIGH (ref 150–400)
RBC: 4 MIL/uL (ref 3.87–5.11)
WBC: 9.8 10*3/uL (ref 4.0–10.5)

## 2011-04-09 LAB — PROTIME-INR: Prothrombin Time: 12.6 seconds (ref 11.6–15.2)

## 2011-04-09 LAB — APTT: aPTT: 27 seconds (ref 24–37)

## 2011-04-10 LAB — GLUCOSE, CAPILLARY: Glucose-Capillary: 154 mg/dL — ABNORMAL HIGH (ref 70–99)

## 2011-04-14 ENCOUNTER — Other Ambulatory Visit (HOSPITAL_COMMUNITY): Payer: Medicare Other

## 2011-04-14 ENCOUNTER — Inpatient Hospital Stay (HOSPITAL_COMMUNITY): Admission: RE | Admit: 2011-04-14 | Payer: Medicare Other | Source: Ambulatory Visit

## 2011-04-16 ENCOUNTER — Encounter (HOSPITAL_COMMUNITY): Payer: Self-pay

## 2011-04-16 ENCOUNTER — Encounter (HOSPITAL_COMMUNITY)
Admission: RE | Admit: 2011-04-16 | Discharge: 2011-04-16 | Disposition: A | Discharge status: Home / Self Care | Payer: Medicare Other | Source: Ambulatory Visit | Attending: Radiation Oncology | Admitting: Radiation Oncology

## 2011-04-16 ENCOUNTER — Other Ambulatory Visit: Payer: Self-pay | Admitting: Radiation Oncology

## 2011-04-16 DIAGNOSIS — H538 Other visual disturbances: Secondary | ICD-10-CM | POA: Insufficient documentation

## 2011-04-16 DIAGNOSIS — C549 Malignant neoplasm of corpus uteri, unspecified: Secondary | ICD-10-CM | POA: Insufficient documentation

## 2011-04-16 DIAGNOSIS — Z923 Personal history of irradiation: Secondary | ICD-10-CM | POA: Insufficient documentation

## 2011-04-16 DIAGNOSIS — C775 Secondary and unspecified malignant neoplasm of intrapelvic lymph nodes: Secondary | ICD-10-CM | POA: Insufficient documentation

## 2011-04-16 DIAGNOSIS — C541 Malignant neoplasm of endometrium: Secondary | ICD-10-CM

## 2011-04-16 DIAGNOSIS — K439 Ventral hernia without obstruction or gangrene: Secondary | ICD-10-CM | POA: Insufficient documentation

## 2011-04-16 DIAGNOSIS — Z90710 Acquired absence of both cervix and uterus: Secondary | ICD-10-CM | POA: Insufficient documentation

## 2011-04-16 LAB — GLUCOSE, CAPILLARY: Glucose-Capillary: 115 mg/dL — ABNORMAL HIGH (ref 70–99)

## 2011-04-16 MED ORDER — IOHEXOL 300 MG/ML  SOLN
100.0000 mL | Freq: Once | INTRAMUSCULAR | Status: AC | PRN
  Administered 2011-04-16: 100 mL via INTRAVENOUS

## 2011-04-16 MED ORDER — FLUDEOXYGLUCOSE F - 18 (FDG) INJECTION
16.4000 | Freq: Once | INTRAVENOUS | Status: AC | PRN
  Administered 2011-04-16: 16.4 via INTRAVENOUS

## 2011-04-21 ENCOUNTER — Other Ambulatory Visit (HOSPITAL_COMMUNITY): Payer: Medicare Other

## 2011-05-12 ENCOUNTER — Encounter (HOSPITAL_BASED_OUTPATIENT_CLINIC_OR_DEPARTMENT_OTHER): Payer: Medicare Other | Admitting: Oncology

## 2011-05-12 ENCOUNTER — Other Ambulatory Visit: Payer: Self-pay | Admitting: Oncology

## 2011-05-12 DIAGNOSIS — C549 Malignant neoplasm of corpus uteri, unspecified: Secondary | ICD-10-CM

## 2011-05-12 DIAGNOSIS — C55 Malignant neoplasm of uterus, part unspecified: Secondary | ICD-10-CM

## 2011-05-12 DIAGNOSIS — C772 Secondary and unspecified malignant neoplasm of intra-abdominal lymph nodes: Secondary | ICD-10-CM

## 2011-05-12 DIAGNOSIS — G609 Hereditary and idiopathic neuropathy, unspecified: Secondary | ICD-10-CM

## 2011-05-12 DIAGNOSIS — N289 Disorder of kidney and ureter, unspecified: Secondary | ICD-10-CM

## 2011-05-12 LAB — CBC WITH DIFFERENTIAL/PLATELET
BASO%: 0.2 % (ref 0.0–2.0)
Basophils Absolute: 0 10*3/uL (ref 0.0–0.1)
EOS%: 2.4 % (ref 0.0–7.0)
HCT: 33.7 % — ABNORMAL LOW (ref 34.8–46.6)
HGB: 11.4 g/dL — ABNORMAL LOW (ref 11.6–15.9)
LYMPH%: 8.2 % — ABNORMAL LOW (ref 14.0–49.7)
MCH: 29.5 pg (ref 25.1–34.0)
MCHC: 33.7 g/dL (ref 31.5–36.0)
MONO#: 0.6 10*3/uL (ref 0.1–0.9)
NEUT%: 81.2 % — ABNORMAL HIGH (ref 38.4–76.8)
Platelets: 417 10*3/uL — ABNORMAL HIGH (ref 145–400)

## 2011-05-12 LAB — COMPREHENSIVE METABOLIC PANEL
ALT: 12 U/L (ref 0–35)
BUN: 23 mg/dL (ref 6–23)
CO2: 25 mEq/L (ref 19–32)
Creatinine, Ser: 1.38 mg/dL — ABNORMAL HIGH (ref 0.40–1.20)
Total Bilirubin: 0.2 mg/dL — ABNORMAL LOW (ref 0.3–1.2)

## 2011-05-13 NOTE — Consult Note (Signed)
NAME:  Lauren [name], Lauren [name]             ACCOUNT NO.:  [id number]   MEDICAL RECORD NO.:  [id number]          PATIENT TYPE:  OUT   LOCATION:  ENDO                         FACILITY:  [facility]   PHYSICIAN:  Paola A. Duard Brady, MD    DATE OF BIRTH:  1951/01/11   DATE OF CONSULTATION:  04/25/2009  DATE OF DISCHARGE:                                 CONSULTATION   Lauren [name] is a very pleasant 60 year old with stage IIIC endometrial  cancer.  She has a history of stage III disease after having undergone  exploratory laparotomy, TAH-BSO and appropriate staging in May of 2009.  Final pathology was a grade 1 endometrial carcinoma involving the outer  half of the myometrium with lymphovascular space involvement.  The  adnexa were negative.  However, she had 2/7 lymph nodes in the pelvic  region positive for metastatic disease with extranodal extension in two  of these left pelvic lymph nodes with negative washings.  She underwent  a total of six cycles of paclitaxel and carboplatin-based chemotherapy  with pelvic radiation and vaginal cuff brachytherapy.  She completed her  most recent cycle of chemotherapy in March of 2010.  She handled her  treatment fairly well, having undergone chemotherapy under the care of  Dr. Darrold Span and her radiation under the care of Dr. Roselind Messier.  She had a  nuclear medicine whole-bone body scan in February, secondary to back  pain, that revealed no evidence for bone metastasis.  She had lumbar  degenerative disk disease.   She had a post-treatment CT scan of the chest, abdomen and pelvis April  16 that revealed no enlarged axillary or supraclavicular lymph nodes, no  mediastinal or hilar nodes.  The osseous structures were normal.  Within  the abdomen, the spleen was normal, the adrenal glands, pancreas, left  kidney, right kidney were all unremarkable.  The small bowel loops were  normal.  There were no peritoneal nodules or evidence for metastatic  disease.  She had mild  lumbar spondylosis.  Within the pelvis, there is  that laxity of the ventral abdominal wall fascia.  There is a large  pannus with ventral protuberance of nonobstructive loops of small and  large bowel.   She comes in today for follow-up.  She is overall feeling fairly weak,  but starting to feel like she is getting over all her treatment.  Her  hematocrit was 28 on 04/07/09.  The biggest issue was the left leg and  left hip pain.  Both are uncomfortable. She is unable to sit or stand  for any length of time.  Left is greater than right.  She does have some  neuropathy in her feet.  It was in the entire lower part of her foot;  now it is decreased and primarily the ball of her foot.  The neuropathy  in her hands has diminished since she completed her therapy. She is  recovering from extensive treatment and should be able to return to full  unrestricted work activities in 3 - 4 weeks time.   She is quitting smoking with her and her husband both having  the goal of  quitting this year.  She states that she is down to one and a half packs  per day.  She denies any bleeding or any change in bowel or bladder  habits.  She notes that the abdominal wall laxity is an issue for her.  There is no significant pain associated with it, but she notices it from  a cosmetic standpoint.   PHYSICAL EXAMINATION:  Weight 236 pounds, which is stable.  Well-  nourished, well-developed female in no acute distress.  NECK:  Supple.  There is no lymphadenopathy, no thyromegaly.  LUNGS:  Clear to auscultation with distant breath sounds.  There are a  few expiratory wheezes.  CARDIOVASCULAR:  Regular rate and rhythm.  ABDOMEN:  Morbidly obese.  There is a vertical midline incision.  She  does have a large lower quadrant/incisional hernia, appearing to be  primarily on the right side.  It is easily reducible.  It is nontender.  Groins are negative for adenopathy but somewhat limited by habitus.  EXTREMITIES:   There is no edema.  PELVIC:  External genitalia is within normal limits, though atrophic.  The vagina is markedly atrophic.  The vaginal cuff is visualized.  There  are no visible lesions.  ThinPrep Pap was submitted without difficulty.  Bimanual examination reveals no masses or nodularity.  Rectal confirms.  Exam is somewhat limited by habitus.   ASSESSMENT:  A 60 year old with stage IIIC endometrial carcinoma, who  clinically has no evidence of recurrent disease.   PLAN:  1. I will follow up results of her Pap smear from today. She continues      to recover from her treatments.  2. She has a follow-up appointment with Dr. Darrold Span in July 2010.  3. She will follow up with Dr. Roselind Messier in August and we will see her in      December.  She knows to contact us if she has any bleeding or any      other symptoms.  She plans on following up with Dr. Artis Flock for her      back pain and leg pain.  4. We congratulated and continued to encourage her on her smoking      cessation.  5. We discussed the hernia.  At some point she may want to have this      repaired, though I would recommend that she proceed with smoking      cessation prior to that time.  It may be that it could be done      laparoscopically and we would refer her to the general surgery      group here.      Paola A. Duard Brady, MD  Electronically Signed     PAG/MEDQ  D:  04/25/2009  T:  04/25/2009  Job:  [id number]   cc:   Quita Skye. Artis Flock, M.D.  Fax: [phone]   Billie Lade, M.D.  Fax: [phone]   Telford Nab, R.N.  [address]  [place], [place] [address]   Lennis P. Darrold Span, M.D.  Fax: [phone]

## 2011-05-13 NOTE — Op Note (Signed)
NAME:  Lauren [name], Lauren [name]             ACCOUNT NO.:  [id number]   MEDICAL RECORD NO.:  [id number]          PATIENT TYPE:  AMB   LOCATION:  DAY                          FACILITY:  [facility]   PHYSICIAN:  Paola A. Duard Brady, MD    DATE OF BIRTH:  05/23/1951   DATE OF PROCEDURE:  05/02/2008  DATE OF DISCHARGE:                               OPERATIVE REPORT   PREOPERATIVE DIAGNOSIS:  Grade 1 endometrial carcinoma, morbid obesity.   POSTOPERATIVE DIAGNOSIS:  Stage IIIC endometrial carcinoma.   PROCEDURE:  Total abdominal hysterectomy, bilateral salpingo-  oophorectomy, bilateral pelvic lymph node sampling.   SURGEONS:  Paola A. Duard Brady, MD and Roseanna Rainbow, M.D.   ASSISTANT:  Telford Nab, R.N.   ANESTHESIA:  General.   ANESTHESIOLOGIST:  Jill Side, M.D.   BLOOD LOSS:  75 mL.   IV FLUIDS:  1400 mL.   URINE OUTPUT:  150 mL.   SPECIMENS:  Included washings, cervix, uterus, bilateral tubes and  ovaries, bilateral pelvic lymph nodes to pathology.   OPERATIVE FINDINGS:  Included a 12 weeks size globular uterus.  On  frozen section extensive endometrioid adenocarcinoma with endocervical  glandular involvement.  There was a 3 cm left external iliac node that  was enlarged and grossly suspicious, frozen section was positive for  metastatic disease.  There was no other palpably enlarged  lymphadenopathy.  The patient had an enlarged liver consistent with  fatty liver.   The patient was taken to the operating room after being identified as  self.  Informed consent was signed on the chart.  She is placed in  supine position where general anesthesia was induced.  She was then  placed in the lithotomy position with all appropriate precautions.  At  the time of anesthesia at this point her peak inspiratory pressures were  36.  The patient was wheezy.  Decision was made not to proceed with  laparoscopy due to the patient's central morbid obesity, her already  elevated peak  inspiratory pressures, her COPD and likely intolerance of  Trendelenburg.  The abdomen was prepped in the usual fashion.  The  perineum and vagina were prepped in usual fashion.  Foley catheter was  inserted into the bladder under sterile conditions.  A vertical incision  was made with the knife and carried down to the underlying fascia using  Bovie cautery.  The fascia was identified, scored the midline.  The  fascial incision was extended superiorly and inferiorly.  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  The posterior sheath was  identified, tented and entered.  The anterior and posterior leaves of  the peritoneum were then opened in the superior fashion.  The Bookwalter  self-retaining retractor was placed on the bed.  The patient was placed  in minimal Trendelenburg.  With packing her peak inspiratory pressures  increased.  She was placed on pressure central ventilation.  The small  bowel and colon were then packed out in the usual fashion with the  __________  as above were noted.   Our attention was first drawn to the right pelvic sidewall.  The round  ligament on the patient's right side was transected with monopolar  cautery.  The anterior and posterior leaves of the broad ligament were  opened.  The ureter was identified and window was made between the IP  and the ureter.  The IP was clamped x2, transected and suture ligated  with 0 Vicryl.  We then proceeded anteriorly to the bladder flap.  The  uterine vessels were skeletonized on her right side, clamped with curved  mattresses, transected, suture ligated with 0 Vicryl.  A similar  procedure was performed on the patient's left side.  We then continued  down the side of the cervix incorporating the cardinal ligament with  curved Masterson clamps to the level of the cervicovaginal junction.  Each pedicle was created with the mattress and transected, suture  ligated with 0 Vicryl.  We then came across the  cervicovaginal angle.  The cervix was amputated from the vagina.  It was sent for frozen  section.  The vaginal cuff angles were closed with a figure-of-eight  suture of 0 Vicryl and held with a clamp.  The remainder of the vaginal  cuff was closed using figure-of-eight sutures of 0 Vicryl.  The cuff was  noted to be hemostatic.   Our attention was then drawn to the left pelvic sidewall.  This required  repacking the colon.  The along the white line of Toldt to retract  colon.  The ureter was identified and the pararectal space was opened.  The paravesical space was then opened taking the superior vesicle artery  medially.  A curved Deaver was placed in the paravesical space.  The  enlarged lymph node as mentioned above was noted.  External iliac nodal  dissection first proceeded from the circumflex iliac on the patient's  left side to near the bifurcation of the external and internal iliac  vessels.  This lymph node was removed.  There was a small area of  bleeding near the circumflex iliac and two hemoclips were used to  control the bleeding.  The genitofemoral nerve was identified and was  spared.  This large node was sent for frozen section and was positive.  The remainder of the internal and external iliac lymph node sampling was  performed in the usual fashion.  The ureter was kept well medially.  We  did not proceed with obturator dissection secondary to limited  visibility, the patient's morbid obesity.   After assuring hemostasis, our attention was drawn to the patient's  right side.  Similarly the bowel needed to be repacked and manipulated  for adequate exposure.  The superior vesicle was identified.  The  paravesical space was opened.  Similarly the pararectal space was opened  between the ureter and the internal iliac artery.  The nodal bundle  extending over the external iliac artery from the bifurcation to the  circumflex iliac was removed using monopolar cautery.  The  genitofemoral  nerve was spared.  There was a small internal iliac lymph node that was  removed.  A complete dissection was not performed secondary to limited  visibility.  The pedicles and areas of dissection were noted to be  hemostatic.  The abdomen and pelvis were copiously irrigated with warm  saline and all pedicles were inspected and noted be hemostatic.  The  retractor blades were then removed.  All laparotomy sponges removed from  the patient's abdomen.  Count was performed.  The omentum was laid over  the small bowel.  The fascia  was grasped with Kocher clamps and closed  in a mass closure with #1 PDS.  The subcu tissues were irrigated and  made hemostatic with pinpoint cautery.  The skin was closed using skin  clips.   The patient tolerated procedure well and was taken to the recovery room  in stable condition.  All instrument, needle and laparotomy counts were  correct x2.      Paola A. Duard Brady, MD  Electronically Signed     PAG/MEDQ  D:  05/02/2008  T:  05/02/2008  Job:  [id number]   cc:   Gaetano Hawthorne. Lily Peer, M.D.  Fax: [phone]   Telford Nab, R.N.  [address]  [place], [place] [address]   Roseanna Rainbow, M.D.  Fax: [phone]

## 2011-05-13 NOTE — Consult Note (Signed)
Lauren [name], Lauren [name]             ACCOUNT NO.:  [id number]   MEDICAL RECORD NO.:  [id number]          PATIENT TYPE:  OUT   LOCATION:  GYN                          FACILITY:  [facility]   PHYSICIAN:  Paola A. Duard Brady, MD    DATE OF BIRTH:  06-16-1951   DATE OF CONSULTATION:  10/24/2008  DATE OF DISCHARGE:  10/24/2008                                 CONSULTATION   The patient is a very pleasant 60 year old who comes accompanied by her  son-in-law today.  She has a history of stage IIIC endometrioid  adenocarcinoma of the uterus.  She underwent exploratory laparotomy and  TAH-BSO and appropriate staging May 02, 2008.  Final pathology revealed  grade I endometrioid adenocarcinoma involving the outer half of the  myometrium with lymphovascular space involvement.  The adnexa were  negative.  However, she had 2/7 lymph nodes positive for metastatic  cancer with extranodal extension in 2 of these left pelvic lymph nodes  with negative washings.  She has undergone 3 cycles of paclitaxel and  carboplatin chemotherapy and underwent a CT scan of the chest, abdomen  and pelvis October 20, 2008.  She in the past in July of 2009 had small  3-4 mm upper lobe pulmonary nodules.  They were no longer evident.  They  were consistent with either resolution of infectious or inflammatory  process or potentially positive chemotherapy response though the  radiologist favored infectious/inflammatory etiology.  The remainder of  the chest, abdomen and pelvis revealed no evidence of metastatic  disease.  The abdomen did reveal laxity of the anterior abdominal wall  musculature with no discussion regarding the possibility of a hernia.  She states she has overall done very well with her chemotherapy and is  enjoying good quality of life.  She denies any chest pain, shortness of  breath, nausea, vomiting, fevers, chills, headaches, visual changes.  She feels like she has an abdominal wall hernia and has quite a  protuberance there.  She denies any vaginal bleeding, any change in her  bowel or bladder habits.   MEDICATION LIST:  Is reviewed and is unchanged.   PHYSICAL EXAMINATION:  VITAL SIGNS:  Weight 243 pounds, blood pressure  130/70, pulse 80.  GENERAL:  A well-nourished, well-developed female in no acute distress.  NECK:  Neck is supple.  There is no lymphadenopathy, no thyromegaly.  LUNGS:  Clear to auscultation bilaterally.  CARDIOVASCULAR:  Regular rate and rhythm.  ABDOMEN:  Abdomen is difficult to ascertain if she does have a diastasis  or very lax abdominal wall or if she may have an abdominal wall hernia.  If she has an abdominal wall hernia it feels that the defect may be  somewhat in the order of approximately 6-8 cm.  Exam is somewhat limited  by the patient's morbid obesity.  Groins are negative for adenopathy.  EXTREMITIES:  There is no edema.  PELVIC:  External genitalia is within normal limits though atrophic.  The vagina is atrophic.  The vaginal cuff is visualized.  There are no  visible lesions.  Bimanual examination reveals no masses or nodularity.  RECTAL:  Rectal confirms.   ASSESSMENT:  A 60 year old with stage IIIC endometrial carcinoma who is  doing very well with her sandwich based adjuvant therapy.   PLAN:  I believe at this point that we could proceed with her pelvic  radiation with consideration of vaginal cuff HDR.  She will then  complete her final 3 cycles of adjuvant paclitaxel and carboplatin based  chemotherapy with a followup CT scan after completion of all therapy and  then we would follow her expectantly.  Her questions regarding this were  elicited and answered to her satisfaction.  She is very pleased with the  result of her CT scan.      Paola A. Duard Brady, MD  Electronically Signed     PAG/MEDQ  D:  10/26/2008  T:  10/27/2008  Job:  [id number]   cc:   Quita Skye. Artis Flock, M.D.  Fax: [phone]   Billie Lade, M.D.  Fax: [phone]   Telford Nab, R.N.  [address]  [place], [place] [address]   Lennis P. Darrold Span, M.D.  Fax: [phone]

## 2011-05-13 NOTE — Consult Note (Signed)
NAME:  Lauren [name], Lauren [name]             ACCOUNT NO.:  [id number]   MEDICAL RECORD NO.:  [id number]          PATIENT TYPE:  OUT   LOCATION:  GYN                          FACILITY:  [facility]   PHYSICIAN:  Paola A. Duard Brady, MD    DATE OF BIRTH:  09-27-1951   DATE OF CONSULTATION:  08/01/2008  DATE OF DISCHARGE:                                 CONSULTATION   The patient is very pleasant 60 year old with stage IIIC endometrioid  adenocarcinoma.  She underwent exploratory laparotomy, TAH/BSO, and  appropriate staging May 02, 2008.  Final pathology was a grade 1  endometrioid adenocarcinoma invading to the outer half of the myometrium  with angiolymphatic invasion.  Adnexa were negative.  She had a 2/7  removed lymph nodes involved with metastatic cancer with extranodal  extension in 2 of the left pelvic lymph nodes.  Washings were negative.  She was seen by Dr. Stanford Breed on May 09, 2008, for superficial  wound drainage.  At that time, the wound was closed, and there was only  a serous drainage consistent with a separation of a seroma and drainage  of the seroma.  She subsequently had a complete superficial wound  disruption and has been undergoing wet-to-dry care at home.  During this  time, she was not able to come in for further discussion or initiation  of therapy.  When she was here and saw Dr. Loree Fee in May, 2009,  he did discuss her pathology report with her with consideration of  postoperative adjuvant radiation and with or without chemotherapy.  The  patient did have a CT scan of the chest, abdomen, and pelvis on July 19, 2008.  It revealed multiple, ill-defined, 3 to 4 mm nodules  predominantly within the upper lobes.  There are lesions in the left  apex in the right upper lobe.  There are some nodules along the superior  right oblique fissure and in the periphery of the left upper lobe, yet  there is no clear evidence by impression of thoracic metastases.  These  ill-defined  lesions could reflect infectious or inflammatory etiologies.  However, they cannot completely exclude metastases.  There is a 7 mm  hypodensity in the right hepatic lobe.  There is no evidence of any  abdominal or pelvic metastatic disease.  These results were discussed  with the patient as well as her husband.  She states she is doing very  well now that the wound is completely healed.  She is back at work and  is feeling quite well.  Her energy level is improving.   PHYSICAL EXAMINATION:  VITAL SIGNS:  Weight 252 pounds, blood pressure  124/65.  GENERAL:  Well-nourished, well-developed female in no acute distress.  NECK:  Supple.  There is no lymphadenopathy, no thyromegaly.  ABDOMEN:  Shows a well-healed vertical midline incision.  It is unclear  whether there may be a small incisional/ventral hernia or just a laxity  in the abdominal wall.  The incision is healed with a dry eschar.  Abdomen is soft and nontender.  Groins are negative for adenopathy.  PELVIC:  External genitalia  within normal limits though somewhat  atrophic.  The vagina is atrophic.  The vaginal cuff is visualized.  It  is completely healed.  There are no visible lesions.  Bimanual  examination reveals no masses or nodularity.   ASSESSMENT:  A 60 year old stage IIIC endometrioid adenocarcinoma.  Her  disease is based on 2 positive left pelvic lymph nodes with extranodal  extension.  I reviewed the data based on the GOG studies comparing whole  abdominal radiation to chemotherapy in patients with advanced stage  disease, and chemotherapy appeared to be superior to whole abdominal  radiation alone.  I also discussed with her retrospective study showing  a sequence in chemotherapy with radiation may be beneficial to other  sequences of radiation and chemotherapy for women with advanced stage  endometrial cancer.  After our discussion, particularly in the setting  of these questionable pulmonary nodules noted on chest  CT, she wishes to  proceed with adjuvant therapy.  I would recommend proceeding with 3  cycles of Taxol and carboplatin and then repeating the CT scan to  evaluate her pulmonary status in 3 months.  If all appears to be well,  we may then interrupt chemotherapy for consideration of pelvic radiation  followed by an additional 3 cycles of chemotherapy in a sandwich  fashion.  The patient had this discussed with her, and she is  willing to proceed.  Dr. Jama Flavors has been kind enough to work the  patient in for August 10, 2008, at 10:30 a.m.  We will forward a copy of  this note to Dr. Darrold Span and speak with her regarding the patient.  The  patient's questions were answered and elicited to her satisfaction.      Paola A. Duard Brady, MD  Electronically Signed     PAG/MEDQ  D:  08/01/2008  T:  08/01/2008  Job:  [id number]   cc:   Gaetano Hawthorne. Lily Peer, M.D.  Fax: [phone]   Telford Nab, R.N.  [address]  [place], [place] [address]   Lennis P. Darrold Span, M.D.  Fax: [phone]   Quita Skye. Artis Flock, M.D.  Fax: [phone]

## 2011-05-13 NOTE — Consult Note (Signed)
NAME:  Lauren [name], Lauren [name]             ACCOUNT NO.:  [id number]   MEDICAL RECORD NO.:  [id number]         PATIENT TYPE:  LINP   LOCATION:                               FACILITY:  [facility]   PHYSICIAN:  De Blanch, M.D.DATE OF BIRTH:  1951/06/06   DATE OF CONSULTATION:  05/09/2008  DATE OF DISCHARGE:                                 CONSULTATION   CHIEF COMPLAINT:  Draining from her abdominal wound.   The patient underwent a TAH-BSO pelvic lymphadenectomy May 02, 2008 for a  grade 1 endometrial carcinoma.  Yesterday, she developed a significant  amount of drainage from her abdominal wound.  She saw her primary care  physician and then Dr. Lily Peer, who has now referred the patient to  [place].  She reports that over the last 12 hours she has not had any  drainage.  She has been on Levaquin for erythema around her wound since  hospital discharge.  She denies any GI or GU symptoms.  Her appetite is  good, and she is having normal bowel function.  She denies any fevers.   PAST MEDICAL HISTORY/FAMILY HISTORY/SURGICAL HISTORY/SOCIAL HISTORY:  Are reviewed and unchanged from the preoperative workup.   REVIEW OF SYSTEMS:  Negative, except as noted above.   PHYSICAL EXAMINATION:  GENERAL:  Reveals an obese white female, in no  acute distress.  ABDOMEN:  Soft, obese, nontender.  Midline incision is healing, with  staples in place.  Palpation of this wound reveals no additional  drainage.  I had the patient sit up and was again unable to elicit any  drainage.  She does have approximately 1 cm erythema along the entire  length of the wound which I am told is better than it was previously.   IMPRESSION:  Drainage from the wound, which is resolved.  I am uncertain  as to whether this represents a seroma which is released and drained.  I  do not believe it represents dehiscence, given her good bowel function  and benign exam.  At this juncture, we will leave for staples in place  and observe  her.  If her drainage increases, she will return, and we  will remove her staples and likely need to open and packed the wound.   With regard to her pathology, we reviewed the pathology, including the  findings of deep myometrial invasion and 2 of 3 left pelvic lymph nodes  positive for metastatic disease.  I would recommend that the patient  undergo whole pelvis radiation therapy, but before initiating that  therapy she should have a CT scan of the chest, abdomen, and pelvis to  complete her staging, as she is at higher risk to have aortic lymph node  involvement which was not dissected at the time of her surgery because  of her obesity.  We will arrange her to see Dr. Roselind Messier.   She will return to see [place] for a 6-week postoperative visit.   Potential for chemotherapy was also discussed, but at this juncture we  will have her return to see Dr. Duard Brady for further details of that  discussion.  De Blanch, M.D.  Electronically Signed     DC/MEDQ  D:  05/09/2008  T:  05/09/2008  Job:  [id number]   cc:   Telford Nab, R.N.  [address]  [place], [place] [address]   Gaetano Hawthorne. Lily Peer, M.D.  Fax: [phone]

## 2011-05-13 NOTE — Consult Note (Signed)
[name], [name]             ACCOUNT NO.:  [id number]   MEDICAL RECORD NO.:  [id number]          PATIENT TYPE:  OUT   LOCATION:  GYN                          FACILITY:  [facility]   PHYSICIAN:  John T. Kyla Balzarine, M.D.    DATE OF BIRTH:  1951/08/30   DATE OF CONSULTATION:  DATE OF DISCHARGE:                                 CONSULTATION   CHIEF COMPLAINT:  This 60 year old woman is seen at the request of Dr.  Lily Peer for recommendations and management regarding grade 1  endometrioid adenocarcinoma.   HISTORY OF PRESENT ILLNESS:  The patient relates a late menopause  approximately 18 months ago.  She was on no hormonal therapy and since  November has had daily spotting requiring a pad.  She was seen by Dr.  Lily Peer, who did an endometrial biopsy after examination and  ultrasound was significant for a 6-mm endometrial stripe.  The uterus  had a posterior fibroid with normal adnexa and endometrial biopsy was  FIGO grade 1 endometrioid adenocarcinoma.   PAST MEDICAL HISTORY:  Significant for type 2 adult-onset diabetes,  asthma, hypertension, GERD, hypercholesterolemia and overactive  bladder.  She underwent open cholecystectomy several years ago.   MEDICATIONS:  Include Avandamet 03/999 mg twice daily, metoprolol 100 mg  2 times daily, albuterol inhaler 4 mg, furosemide 40 mg, Januvia 100 mg,  Actos 45 mg, Detrol 2 mg, Nexium 40 mg, paroxetine 20 mg, bupropion 300  mg and terazosin 10 mg.  She is on several vitamins including fish oil,  Centrum Silver, vitamin D, menopausal complex, fiber laxative and baby  aspirin.   ALLERGIES:  None known.   FAMILY MEDICAL HISTORY:  No immediate family member has known cancers.   PERSONAL AND SOCIAL HISTORY:  The patient is married.  Admits 1-1/2-pack-  per-day tobacco use and denies ethanol.   REVIEW OF SYSTEMS:  The patient has longstanding IBS symptoms of  alternating constipation and diarrhea.  She states that she uses her  inhaler  approximately 2-3 times weekly depending on pollen and uses a  home nebulizer once or twice monthly.  She has never been intubated or  in an emergency room with her asthma.  She has been treated for  psoriasis with topical medication over the past year by Dr. Doristine Section.  Other than above, negative encompassing 10-point review.   EXAM:  Weight 253 pounds, blood pressure 137/69, pulse 68, temperature  afebrile.  The patient is anxious, alert and oriented x3.  In no acute distress.  HEENT:  Benign.  NECK:  Supple neck and no thyromegaly.  No bruits.  LUNGS:  Upper airway sounds throughout but currently no lower airway  rales or rhonchi and no wheezing.  HEART:  Regular rate and rhythm without murmur or JVD.  ABDOMEN:  Obese, soft and benign with no hernia, ascites, mass or  organomegaly.  No abdominal tenderness.  EXTREMITIES:  Full strength and range of motion without edema, cords or  clubbing.  BACK:  No back or CVA tenderness.  SKIN:  Psoriatic plaques, multiple, but no suspicious lesions.  NEUROLOGIC SCREEN:  Intact.  PELVIC:  External genitalia and BUS normal to inspection and palpation.  Bladder and urethra are normal with no cystocele.  The cervix is mobile  without lesions.  On bimanual and rectovaginal examinations, exact  uterine size is difficult to tell.  It appears that there is no  enlargement laterally but there is upward fullness posteriorly,  compatible with a fibroid seen on ultrasound.   ASSESSMENT:  Grade 1 endometrial adenocarcinoma.   PLAN AND RECOMMENDATIONS:  I believe it would be very reasonable to  attempt a laparoscopic hysterectomy with BSO.  Given her low histologic  grade and habitus, I would likely perform a pelvic lymphadenectomy and  do this during frozen section evaluation of the endometrial primary  lesion, as she would be an extremely difficult minimally invasive aortic  lymphadenectomy.  There may be issues placing her in steep  Trendelenburg,  however, I believe that it would be very reasonable to  attempt minimally invasive surgery because of her habitus and diabetes  as comorbidities.      John T. Kyla Balzarine, M.D.  Electronically Signed     JTS/MEDQ  D:  04/18/2008  T:  04/18/2008  Job:  [id number]   cc:   Gaetano Hawthorne. Lily Peer, M.D.  Fax: [phone]   Telford Nab, R.N.  [address]  [place], [place] [address]

## 2011-05-16 NOTE — Discharge Summary (Signed)
NAMEJAIYLA, Lauren [name]             ACCOUNT NO.:  [id number]   MEDICAL RECORD NO.:  [id number]          PATIENT TYPE:  INP   LOCATION:  1533                         FACILITY:  [facility]   PHYSICIAN:  Roseanna Rainbow, M.D.DATE OF BIRTH:  August 31, 1951   DATE OF ADMISSION:  05/02/2008  DATE OF DISCHARGE:  05/07/2008                               DISCHARGE SUMMARY   CHIEF COMPLAINT:  The patient was a 60 year old with a newly diagnosed  grade I endometrioid adenocarcinoma who presents for operative  management.  Please see the dictated history and physical.   HOSPITAL COURSE:  The patient was admitted and underwent a TAH-BSO and  bilateral pelvic lymph node samplings.  Please see the dictated  operative summary.  On postoperative day #1, she was felt to have a mild  ileus and her status was continued as n.p.o.  She was complaining of  wheezing and respiratory therapy was ordered.  She was euglycemic.  On  postoperative day #3, her labs were normal, and as per the diabetes  treatment program, Lantus was added at bedtime for better glycemic  control.  On postoperative day #4, she was complaining of nausea and an  abdominal x-ray failed to demonstrate an obstructive pattern.  She  developed urinary retention that responded to replacing the Foley  catheter for 24-48 hours.  On postoperative day #5, she was felt to have  an early wound cellulitis, and a urine culture and sensitivity grew out  Klebsiella pneumoniae.  At this point, the patient was discharged to  home.  Please note that the patient received daily subcutaneous Lovenox  injections for DVT prophylaxis.   DISCHARGE DIAGNOSIS:  1. Endometrioid adenocarcinoma, grade I, stage IIA.  2. Urinary tract infection.   PROCEDURES:  Total abdominal hysterectomy, bilateral salpingo-  oophorectomy, bilateral pelvic lymph node sampling.   CONDITION:  Stable.   DISCHARGE INSTRUCTIONS:  1. Diet:  ADA diet.  2. She was also given a  prescription for Levaquin and Lovenox for DVT      prophylaxis.  Marland Kitchen   DISCHARGE MEDICATIONS:  Resume preoperative medications.  Prescription  was given for Percocet.  She was also given a prescription for Levaquin  and Lovenox for DVT prophylaxis.  She was also given a prescription for Levaquin and Lovenox for DVT  prophylaxis.   DISPOSITION:  The patient was to follow up with Dr. Hulan Saas on May  12 at 11:00 a.m. and she was to follow up in the GYN Oncology office on  May 11 and May 14 at 10:00 a.m.      Roseanna Rainbow, M.D.  Electronically Signed     LAJ/MEDQ  D:  05/30/2008  T:  05/30/2008  Job:  [id number]

## 2011-06-11 ENCOUNTER — Other Ambulatory Visit: Payer: Self-pay | Admitting: Oncology

## 2011-06-11 ENCOUNTER — Encounter (HOSPITAL_BASED_OUTPATIENT_CLINIC_OR_DEPARTMENT_OTHER): Payer: Medicare Other | Admitting: Oncology

## 2011-06-11 DIAGNOSIS — C772 Secondary and unspecified malignant neoplasm of intra-abdominal lymph nodes: Secondary | ICD-10-CM

## 2011-06-11 DIAGNOSIS — C549 Malignant neoplasm of corpus uteri, unspecified: Secondary | ICD-10-CM

## 2011-06-11 LAB — CBC WITH DIFFERENTIAL/PLATELET
Basophils Absolute: 0 10*3/uL (ref 0.0–0.1)
Eosinophils Absolute: 0.2 10*3/uL (ref 0.0–0.5)
HCT: 35.9 % (ref 34.8–46.6)
HGB: 12 g/dL (ref 11.6–15.9)
LYMPH%: 3.7 % — ABNORMAL LOW (ref 14.0–49.7)
MCHC: 33.4 g/dL (ref 31.5–36.0)
MONO#: 0.6 10*3/uL (ref 0.1–0.9)
NEUT%: 85 % — ABNORMAL HIGH (ref 38.4–76.8)
Platelets: 368 10*3/uL (ref 145–400)
WBC: 7 10*3/uL (ref 3.9–10.3)
lymph#: 0.3 10*3/uL — ABNORMAL LOW (ref 0.9–3.3)

## 2011-06-11 LAB — COMPREHENSIVE METABOLIC PANEL
AST: 17 U/L (ref 0–37)
Albumin: 4.2 g/dL (ref 3.5–5.2)
Alkaline Phosphatase: 41 U/L (ref 39–117)
BUN: 17 mg/dL (ref 6–23)
Glucose, Bld: 81 mg/dL (ref 70–99)
Potassium: 4.2 mEq/L (ref 3.5–5.3)
Total Bilirubin: 0.3 mg/dL (ref 0.3–1.2)

## 2011-07-28 ENCOUNTER — Ambulatory Visit
Admission: RE | Admit: 2011-07-28 | Discharge: 2011-07-28 | Disposition: A | Discharge status: Home / Self Care | Payer: Medicare Other | Source: Ambulatory Visit | Attending: Radiation Oncology | Admitting: Radiation Oncology

## 2011-07-31 ENCOUNTER — Encounter: Payer: Self-pay | Admitting: Cardiovascular Disease

## 2011-08-08 ENCOUNTER — Encounter: Payer: Medicare Other | Admitting: Vascular Surgery

## 2011-08-13 ENCOUNTER — Encounter: Payer: Self-pay | Admitting: Vascular Surgery

## 2011-08-29 ENCOUNTER — Encounter: Payer: Self-pay | Admitting: Vascular Surgery

## 2011-09-02 ENCOUNTER — Encounter: Payer: Self-pay | Admitting: Vascular Surgery

## 2011-09-02 ENCOUNTER — Ambulatory Visit (INDEPENDENT_AMBULATORY_CARE_PROVIDER_SITE_OTHER): Payer: Medicare Other | Admitting: Vascular Surgery

## 2011-09-02 ENCOUNTER — Encounter: Payer: Medicare Other | Admitting: Vascular Surgery

## 2011-09-02 VITALS — BP 132/64 | HR 52 | Resp 24 | Ht 67.0 in | Wt 223.1 lb

## 2011-09-02 DIAGNOSIS — I6529 Occlusion and stenosis of unspecified carotid artery: Secondary | ICD-10-CM

## 2011-09-02 NOTE — Progress Notes (Signed)
Addended by: Phillips Odor on: 09/02/2011 04:29 PM   Modules accepted: Orders

## 2011-09-02 NOTE — Progress Notes (Signed)
Subjective:     Patient ID: Lauren [name], female   DOB: 03-09-1951, 60 y.o.   MRN: [id number]  HPI this patient is a 60 year old female referred for carotid occlusive disease. She was found to have a right carotid bruit by Dr. Duaine Dredge and a carotid duplex exam was performed at [facility] heart and vascular on 07/31/2011. I have reviewed this report. Interpretation was 70-99% stenosis right internal carotid artery. Peak systolic velocity was 280 and diastolic velocity of 88 on the right. Patient denies any history of stroke, TIAs, amaurosis fugax, diplopia, blurred vision, or syncope.  Past Medical History  Diagnosis Date  . Diabetes mellitus   . Hypertension   . COPD (chronic obstructive pulmonary disease)   . Depression   . Osteoarthritis   . Ventral hernia     postoperative since 2009  . Peripheral neuropathy     following chemotherapy  . Carotid artery occlusion   . Endometrial ca     endometrial ca dx 4/11  . 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 Smoking status: Current Everyday Smoker -- 2.0 packs/day for 40 years    Types: Cigarettes  . Smokeless tobacco: Not on file   Comment: has decreased to 4 cigarettes/day  . Alcohol Use: No    Family History  Problem Relation Age of Onset  . Stroke Mother   . Heart attack Father     Allergies  Allergen Reactions  . Sulfa Antibiotics     Current outpatient prescriptions:albuterol (PROVENTIL,VENTOLIN) 90 MCG/ACT inhaler, Inhale 2 puffs into the lungs every 4 (four) hours as needed.  , Disp: , Rfl: ;  ALPRAZolam (XANAX) 0.5 MG tablet, Take 0.5 mg by mouth at bedtime as needed.  , Disp: , Rfl: ;  aspirin 81 MG tablet, Take 81 mg by mouth daily.  , Disp: , Rfl: ;  chlorthalidone (HYGROTON) 25 MG tablet, Take 25 mg by mouth every morning.  , Disp: , Rfl:  Cholecalciferol (VITAMIN D3) 1000 UNITS CAPS, Take 1 Units by mouth daily.  , Disp: , Rfl: ;  clobetasol  (TEMOVATE) 0.05 % cream, Apply topically 2 (two) times daily as needed.  , Disp: , Rfl: ;  fish oil-omega-3 fatty acids 1000 MG capsule, Take 1 g by mouth daily.  , Disp: , Rfl: ;  fluocinonide (LIDEX) 0.05 % cream, Apply topically 2 (two) times daily as needed.  , Disp: , Rfl:  gabapentin (NEURONTIN) 600 MG tablet, Take 600 mg by mouth 2 (two) times daily.  , Disp: , Rfl: ;  glucose blood (ACCU-CHEK AVIVA PLUS) test strip, 1 each by Other route as needed. Use as instructed , Disp: , Rfl: ;  ibuprofen (ADVIL,MOTRIN) 200 MG tablet, Take 200 mg by mouth 2 (two) times daily as needed.  , Disp: , Rfl: ;  insulin glargine (LANTUS) 100 UNIT/ML injection, Inject 90 Units into the skin daily at 12 noon. , Disp: , Rfl:  losartan (COZAAR) 100 MG tablet, Take 100 mg by mouth daily.  , Disp: , Rfl: ;  metFORMIN (GLUMETZA) 1000 MG (MOD) 24 hr tablet, Take 1,000 mg by mouth 2 (two) times daily with a meal.  , Disp: , Rfl: ;  metoprolol (TOPROL-XL) 100 MG 24 hr tablet, Take 100 mg by mouth daily after breakfast.  , Disp: , Rfl: ;  ondansetron (ZOFRAN) 4 MG tablet, Take 4 mg by mouth 2 (two) times daily as  needed.  , Disp: , Rfl:  oxyCODONE-acetaminophen (PERCOCET) 7.5-325 MG per tablet, Take 1 tablet by mouth 4 (four) times daily as needed.  , Disp: , Rfl: ;  PARoxetine (PAXIL) 20 MG tablet, Take 20 mg by mouth every morning.  , Disp: , Rfl: ;  potassium chloride (KLOR-CON) 10 MEQ CR tablet, Take 10 mEq by mouth daily.  , Disp: , Rfl: ;  vitamin B-12 (CYANOCOBALAMIN) 500 MCG tablet, Take 500 mcg by mouth 3 (three) times daily.  , Disp: , Rfl:  Varenicline Tartrate (CHANTIX PO), Take 1 mg by mouth.  , Disp: , Rfl:   BP 132/64  Pulse 52  Resp 24  Ht 5\' 7"  (1.702 m)  Wt 223 lb 1.6 oz (101.197 kg)  BMI 34.94 kg/m2  Body mass index is 34.94 kg/(m^2).        Review of Systems patient admits to pain in legs with walking and numbness of feet. Also positive for asthma, wheezing, dyspnea on exertion, orthopnea, reflux  esophagitis, all other systems are negative and a complete review of systems    Objective:   Physical Exam.BP 132/64  Pulse 52  Resp 24  Ht 5\' 7"  (1.702 m)  Wt 223 lb 1.6 oz (101.197 kg)  BMI 34.94 kg/m2  Generally she is a well-developed well-nourished female in no apparent distress HEENT exam normal for age Chest clear to auscultation no rhonchi or wheezing Cardiovascular regular rhythm no murmurs carotid pulses are 3+ with a high pitched bruit over the right carotid bifurcation Abdomen is obese no palpable masses there is a large ventral hernia in the right lower quadrant from previous surgery Lower extremity exam reveals 3+ femoral 1-2+ popliteal pulses bilaterally Neurologic exam normal with the exception of some decreased sensation in the feet Skin free of rashes    Assessment:     Moderately severe right internal carotid stenosis approximating 70-75%-asymptomatic    Plan:     Do not feel patient needs right carotid endarterectomy at the present time. If she develops progressive disease or symptoms she should have surgery. Will return in 6 months with a carotid duplex exam done in our office if symptoms acutely occur in the interim she will be in touch with [place]. Continue aspirin 81 mg per day.

## 2011-09-29 ENCOUNTER — Other Ambulatory Visit: Payer: Self-pay | Admitting: Radiation Oncology

## 2011-09-29 ENCOUNTER — Ambulatory Visit
Admission: RE | Admit: 2011-09-29 | Discharge: 2011-09-29 | Disposition: A | Discharge status: Home / Self Care | Payer: Medicare Other | Source: Ambulatory Visit | Attending: Radiation Oncology | Admitting: Radiation Oncology

## 2011-09-29 DIAGNOSIS — C549 Malignant neoplasm of corpus uteri, unspecified: Secondary | ICD-10-CM | POA: Insufficient documentation

## 2011-09-29 DIAGNOSIS — C541 Malignant neoplasm of endometrium: Secondary | ICD-10-CM

## 2011-09-29 LAB — COMPREHENSIVE METABOLIC PANEL
ALT: 20 U/L (ref 0–35)
Alkaline Phosphatase: 62 U/L (ref 39–117)
Creatinine, Ser: 1.99 mg/dL — ABNORMAL HIGH (ref 0.50–1.10)
Sodium: 142 mEq/L (ref 135–145)
Total Bilirubin: 0.2 mg/dL — ABNORMAL LOW (ref 0.3–1.2)
Total Protein: 7 g/dL (ref 6.0–8.3)

## 2011-09-29 LAB — CBC WITH DIFFERENTIAL/PLATELET
BASO%: 0.5 % (ref 0.0–2.0)
HCT: 35.3 % (ref 34.8–46.6)
HGB: 12 g/dL (ref 11.6–15.9)
MCHC: 33.9 g/dL (ref 31.5–36.0)
MONO#: 0.8 10*3/uL (ref 0.1–0.9)
NEUT%: 85.2 % — ABNORMAL HIGH (ref 38.4–76.8)
RDW: 14.8 % — ABNORMAL HIGH (ref 11.2–14.5)
WBC: 12.1 10*3/uL — ABNORMAL HIGH (ref 3.9–10.3)
lymph#: 0.6 10*3/uL — ABNORMAL LOW (ref 0.9–3.3)

## 2011-10-01 ENCOUNTER — Encounter: Payer: Self-pay | Admitting: Cardiovascular Disease

## 2011-10-01 LAB — PULMONARY FUNCTION TEST

## 2011-10-03 ENCOUNTER — Ambulatory Visit (HOSPITAL_COMMUNITY)
Admission: RE | Admit: 2011-10-03 | Discharge: 2011-10-03 | Disposition: A | Discharge status: Home / Self Care | Payer: Medicare Other | Source: Ambulatory Visit | Attending: Radiation Oncology | Admitting: Radiation Oncology

## 2011-10-03 ENCOUNTER — Other Ambulatory Visit: Payer: Self-pay | Admitting: Radiation Oncology

## 2011-10-03 DIAGNOSIS — R599 Enlarged lymph nodes, unspecified: Secondary | ICD-10-CM | POA: Insufficient documentation

## 2011-10-03 DIAGNOSIS — I2510 Atherosclerotic heart disease of native coronary artery without angina pectoris: Secondary | ICD-10-CM | POA: Insufficient documentation

## 2011-10-03 DIAGNOSIS — N200 Calculus of kidney: Secondary | ICD-10-CM | POA: Insufficient documentation

## 2011-10-03 DIAGNOSIS — N289 Disorder of kidney and ureter, unspecified: Secondary | ICD-10-CM | POA: Insufficient documentation

## 2011-10-03 DIAGNOSIS — C541 Malignant neoplasm of endometrium: Secondary | ICD-10-CM

## 2011-10-03 DIAGNOSIS — E119 Type 2 diabetes mellitus without complications: Secondary | ICD-10-CM | POA: Insufficient documentation

## 2011-10-03 DIAGNOSIS — C549 Malignant neoplasm of corpus uteri, unspecified: Secondary | ICD-10-CM | POA: Insufficient documentation

## 2011-10-03 DIAGNOSIS — E041 Nontoxic single thyroid nodule: Secondary | ICD-10-CM | POA: Insufficient documentation

## 2011-10-16 ENCOUNTER — Other Ambulatory Visit: Payer: Self-pay | Admitting: Family Medicine

## 2011-10-16 DIAGNOSIS — Z1231 Encounter for screening mammogram for malignant neoplasm of breast: Secondary | ICD-10-CM

## 2011-10-20 ENCOUNTER — Ambulatory Visit
Admission: RE | Admit: 2011-10-20 | Discharge: 2011-10-20 | Disposition: A | Discharge status: Home / Self Care | Payer: Medicare Other | Source: Ambulatory Visit | Attending: Family Medicine | Admitting: Family Medicine

## 2011-10-20 ENCOUNTER — Other Ambulatory Visit: Payer: Self-pay | Admitting: Family Medicine

## 2011-10-20 DIAGNOSIS — R0600 Dyspnea, unspecified: Secondary | ICD-10-CM

## 2011-10-20 DIAGNOSIS — J449 Chronic obstructive pulmonary disease, unspecified: Secondary | ICD-10-CM

## 2011-10-20 DIAGNOSIS — M25512 Pain in left shoulder: Secondary | ICD-10-CM

## 2011-11-10 ENCOUNTER — Ambulatory Visit
Admission: RE | Admit: 2011-11-10 | Discharge: 2011-11-10 | Disposition: A | Discharge status: Home / Self Care | Payer: Medicare Other | Source: Ambulatory Visit | Attending: Family Medicine | Admitting: Family Medicine

## 2011-11-10 DIAGNOSIS — Z1231 Encounter for screening mammogram for malignant neoplasm of breast: Secondary | ICD-10-CM

## 2011-11-17 ENCOUNTER — Encounter: Payer: Self-pay | Admitting: Cardiovascular Disease

## 2011-11-17 ENCOUNTER — Ambulatory Visit (INDEPENDENT_AMBULATORY_CARE_PROVIDER_SITE_OTHER): Payer: Medicare Other | Admitting: Cardiovascular Disease

## 2011-11-17 VITALS — BP 126/72 | HR 66 | Ht 64.0 in | Wt 220.0 lb

## 2011-11-17 DIAGNOSIS — G458 Other transient cerebral ischemic attacks and related syndromes: Secondary | ICD-10-CM

## 2011-11-17 DIAGNOSIS — I10 Essential (primary) hypertension: Secondary | ICD-10-CM

## 2011-11-17 DIAGNOSIS — R9431 Abnormal electrocardiogram [ECG] [EKG]: Secondary | ICD-10-CM

## 2011-11-17 DIAGNOSIS — E785 Hyperlipidemia, unspecified: Secondary | ICD-10-CM

## 2011-11-17 NOTE — Patient Instructions (Signed)
Your physician recommends that you continue on your current medications as directed. Please refer to the Current Medication list given to you today.  Your physician has requested that you have a lexiscan myoview. For further information please visit [email or web address]. Please follow instruction sheet, as given.  Your physician recommends that you schedule a follow-up appointment as needed  

## 2011-11-17 NOTE — Progress Notes (Signed)
HPI:  This is a 60 year-old woman with carotid disease, multiple cardiac risk factors, referred for initial evaluation of an abnormal EKG. I have personally reviewed her EKG from 10/01/2011 and it demonstrates normal sinus rhythm with ST segment depression concerning for inferior ischemia. The patient is a longstanding smoker who also has hypertension, hyperlipidemia, and insulin-requiring diabetes. She complains of chronic dyspnea with exertion. She specifically denies chest pain or pressure with activity. She denies orthopnea, PND, palpitations, lightheadedness, or syncope.   She was recently diagnosed with carotid stenosis and was evaluated by Dr Hart Rochester. His consultation note was reviewed and he plans of carotid duplex surveillance at 6 month intervals for moderate asymptomatic carotid disease.  Outpatient Encounter Prescriptions as of 11/17/2011  Medication Sig Dispense Refill  . ALPRAZolam (XANAX) 0.5 MG tablet Take 0.5 mg by mouth at bedtime as needed.        Marland Kitchen aspirin 81 MG tablet Take 81 mg by mouth daily.        . chlorthalidone (HYGROTON) 25 MG tablet Take 25 mg by mouth every morning.        . Cholecalciferol (VITAMIN D3) 1000 UNITS CAPS Take 1 Units by mouth daily.        . clobetasol (TEMOVATE) 0.05 % cream Apply topically 2 (two) times daily as needed.        . fish oil-omega-3 fatty acids 1000 MG capsule Take 1 g by mouth daily.        . Fluticasone-Salmeterol (ADVAIR) 250-50 MCG/DOSE AEPB Inhale 1 puff into the lungs every 12 (twelve) hours.        . gabapentin (NEURONTIN) 600 MG tablet Take 600 mg by mouth 2 (two) times daily.        Marland Kitchen glucose blood (ACCU-CHEK AVIVA PLUS) test strip 1 each by Other route as needed. Use as instructed       . ibuprofen (ADVIL,MOTRIN) 200 MG tablet Take 200 mg by mouth 2 (two) times daily as needed.        . insulin glargine (LANTUS) 100 UNIT/ML injection Inject 90 Units into the skin daily at 12 noon.       Marland Kitchen losartan (COZAAR) 100 MG tablet Take 100 mg  by mouth daily.        . metFORMIN (GLUMETZA) 1000 MG (MOD) 24 hr tablet Take 1,000 mg by mouth 2 (two) times daily with a meal.        . metoprolol (TOPROL-XL) 100 MG 24 hr tablet Take 100 mg by mouth daily after breakfast.        . Multiple Vitamin (MULTIVITAMIN) tablet Take 1 tablet by mouth daily.        Marland Kitchen NEXIUM 40 MG capsule Take 1 capsule by mouth daily.      . Nutritional Supplements (ESTROVEN MAXIMUM STRENGTH PO) Take by mouth daily.        Marland Kitchen oxyCODONE-acetaminophen (PERCOCET) 7.5-325 MG per tablet Take 1 tablet by mouth 4 (four) times daily as needed.        Marland Kitchen PARoxetine (PAXIL) 20 MG tablet Take 20 mg by mouth every morning.        . potassium chloride (KLOR-CON) 10 MEQ CR tablet Take 10 mEq by mouth daily.        . psyllium (REGULOID) 0.52 G capsule Take 0.52 g by mouth daily.        Marland Kitchen terazosin (HYTRIN) 10 MG capsule Take 1 tablet by mouth daily.      . VENTOLIN HFA 108 (90  BASE) MCG/ACT inhaler Inhale 2 puffs into the lungs 4 times daily.      . vitamin B-12 (CYANOCOBALAMIN) 500 MCG tablet Take 500 mcg by mouth 3 (three) times daily.        Marland Kitchen DISCONTD: albuterol (PROVENTIL,VENTOLIN) 90 MCG/ACT inhaler Inhale 2 puffs into the lungs every 4 (four) hours as needed.        Marland Kitchen DISCONTD: KLOR-CON M10 10 MEQ tablet Take 1 tablet by mouth daily.      Marland Kitchen DISCONTD: fluocinonide (LIDEX) 0.05 % cream Apply topically 2 (two) times daily as needed.        Marland Kitchen DISCONTD: ondansetron (ZOFRAN) 4 MG tablet Take 4 mg by mouth 2 (two) times daily as needed.        Marland Kitchen DISCONTD: Varenicline Tartrate (CHANTIX PO) Take 1 mg by mouth.          Sulfa antibiotics  Past Medical History  Diagnosis Date  . Diabetes mellitus   . Hypertension   . COPD (chronic obstructive pulmonary disease)   . Depression   . Osteoarthritis   . Ventral hernia     postoperative since 2009  . Peripheral neuropathy     following chemotherapy  . Carotid artery occlusion   . Endometrial ca     endometrial ca dx 4/11  . 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 Abdominal hysterectomy   . Cholecystectomy     biliteral    History   Social History  . Marital Status: Married    Spouse Name: N/A    Number of Children: N/A  . Years of Education: N/A   Occupational History  . Not on file.   Social History Main Topics  . Smoking status: Current Everyday Smoker -- 2.0 packs/day for 40 years    Types: Cigarettes  . Smokeless tobacco: Not on file   Comment: has decreased to 4 cigarettes/day  . Alcohol Use: No  . Drug Use: No  . Sexually Active: Not on file   Other Topics Concern  . Not on file   Social History Narrative  . No narrative on file    Family History  Problem Relation Age of Onset  . Stroke Mother   . Heart attack Father     ROS: General: no fevers/chills/night sweats. Positive for weight loss. Eyes: no blurry vision, diplopia, or amaurosis ENT: no sore throat or hearing loss Resp: no cough, wheezing, or hemoptysis. Positive for shortness of breath. CV: no edema or palpitations GI: no abdominal pain, nausea, vomiting, diarrhea, or constipation. Positive for GERD symptoms/indigestion. GU: no dysuria, frequency, or hematuria Skin: no rash Neuro: no headache, tingling, or weakness of extremities. Positive for paresthesias of the hands and feet. Musculoskeletal: no joint pain or swelling Heme: no bleeding, DVT, or easy bruising Endo: no polydipsia or polyuria  BP 126/72  Pulse 66  Ht 5\' 4"  (1.626 m)  Wt 99.791 kg (220 lb)  BMI 37.76 kg/m2  PHYSICAL EXAM: Pt is alert and oriented, overweight woman in no distress. HEENT: normal Neck: JVP normal. Carotid upstrokes normal with bilateral bruits. No thyromegaly. Lungs: equal expansion, clear bilaterally CV: Apex is discrete and nondisplaced, RRR without murmur or gallop Abd: soft, NT, +BS, no bruit, no hepatosplenomegaly. Ventral hernia noted. Back: no CVA  tenderness Ext: no C/C/E        Femoral pulses 2+= without bruits  DP/PT pulses intact and = Skin: warm and dry without rash Neuro: CNII-XII intact             Strength intact = bilaterally  EKG:  NSR 66 bpm, within normal limits.  ASSESSMENT AND PLAN:

## 2011-11-19 ENCOUNTER — Other Ambulatory Visit: Payer: Self-pay | Admitting: Family Medicine

## 2011-11-19 DIAGNOSIS — R928 Other abnormal and inconclusive findings on diagnostic imaging of breast: Secondary | ICD-10-CM

## 2011-11-25 ENCOUNTER — Ambulatory Visit (HOSPITAL_COMMUNITY): Payer: Medicare Other | Attending: Cardiology | Admitting: Radiology

## 2011-11-25 VITALS — Ht 64.0 in | Wt 214.0 lb

## 2011-11-25 DIAGNOSIS — R42 Dizziness and giddiness: Secondary | ICD-10-CM | POA: Insufficient documentation

## 2011-11-25 DIAGNOSIS — R002 Palpitations: Secondary | ICD-10-CM | POA: Insufficient documentation

## 2011-11-25 DIAGNOSIS — R062 Wheezing: Secondary | ICD-10-CM

## 2011-11-25 DIAGNOSIS — R9431 Abnormal electrocardiogram [ECG] [EKG]: Secondary | ICD-10-CM | POA: Insufficient documentation

## 2011-11-25 DIAGNOSIS — R55 Syncope and collapse: Secondary | ICD-10-CM | POA: Insufficient documentation

## 2011-11-25 DIAGNOSIS — E119 Type 2 diabetes mellitus without complications: Secondary | ICD-10-CM | POA: Insufficient documentation

## 2011-11-25 DIAGNOSIS — R0602 Shortness of breath: Secondary | ICD-10-CM | POA: Insufficient documentation

## 2011-11-25 DIAGNOSIS — I779 Disorder of arteries and arterioles, unspecified: Secondary | ICD-10-CM | POA: Insufficient documentation

## 2011-11-25 DIAGNOSIS — Z794 Long term (current) use of insulin: Secondary | ICD-10-CM | POA: Insufficient documentation

## 2011-11-25 DIAGNOSIS — J449 Chronic obstructive pulmonary disease, unspecified: Secondary | ICD-10-CM | POA: Insufficient documentation

## 2011-11-25 DIAGNOSIS — I4949 Other premature depolarization: Secondary | ICD-10-CM

## 2011-11-25 DIAGNOSIS — F17200 Nicotine dependence, unspecified, uncomplicated: Secondary | ICD-10-CM | POA: Insufficient documentation

## 2011-11-25 DIAGNOSIS — R0989 Other specified symptoms and signs involving the circulatory and respiratory systems: Secondary | ICD-10-CM | POA: Insufficient documentation

## 2011-11-25 DIAGNOSIS — I10 Essential (primary) hypertension: Secondary | ICD-10-CM | POA: Insufficient documentation

## 2011-11-25 DIAGNOSIS — J4489 Other specified chronic obstructive pulmonary disease: Secondary | ICD-10-CM | POA: Insufficient documentation

## 2011-11-25 DIAGNOSIS — R0609 Other forms of dyspnea: Secondary | ICD-10-CM | POA: Insufficient documentation

## 2011-11-25 MED ORDER — REGADENOSON 0.4 MG/5ML IV SOLN
0.4000 mg | Freq: Once | INTRAVENOUS | Status: AC
  Administered 2011-11-25: 0.4 mg via INTRAVENOUS

## 2011-11-25 MED ORDER — TECHNETIUM TC 99M TETROFOSMIN IV KIT
11.0000 | PACK | Freq: Once | INTRAVENOUS | Status: AC | PRN
  Administered 2011-11-25: 11 via INTRAVENOUS

## 2011-11-25 MED ORDER — TECHNETIUM TC 99M TETROFOSMIN IV KIT
33.0000 | PACK | Freq: Once | INTRAVENOUS | Status: AC | PRN
  Administered 2011-11-25: 33 via INTRAVENOUS

## 2011-11-25 MED ORDER — ALBUTEROL SULFATE HFA 108 (90 BASE) MCG/ACT IN AERS
2.0000 | INHALATION_SPRAY | Freq: Once | RESPIRATORY_TRACT | Status: AC
  Administered 2011-11-25: 2 via RESPIRATORY_TRACT

## 2011-11-25 NOTE — Progress Notes (Signed)
[facility] SITE 3 NUCLEAR MED [address] [place] [address] [phone]  Cardiology Nuclear Med Study  Lauren [name] is a 60 y.o. female [id number] Mar 09, 1951   Nuclear Med Background Indication for Stress Test:  Evaluation for Ischemia and Abnormal EKG on 11/17/11 History:  COPD and History of Chemo Cardiac Risk Factors: Carotid Disease, Hypertension, IDDM Type 2 and Smoker  Symptoms:  Dizziness, DOE, Near Syncope, Palpitations and SOB   Nuclear Pre-Procedure Caffeine/Decaff Intake:  None NPO After: 9:00pm   Lungs:  Inspiratory wheezing noted. Albuterol Inhaler x 2 puffs given. Decreased wheezing after treatment given. IV 0.9% NS with Angio Cath:  22g  IV Site: R Antecubital 2nd attempt  IV Started by:  Stanton Kidney, EMT-P  Chest Size (in):  44 Cup Size: C  Height: 5\' 4"  (1.626 m)  Weight:  214 lb (97.07 kg)  BMI:  Body mass index is 36.73 kg/(m^2). Tech Comments:  Metoprolol held, per patient.    Nuclear Med Study 1 or 2 day study: 1 day  Stress Test Type:  Eugenie Birks  Reading MD: Olga Millers, MD  Order Authorizing Provider:  Micheline Chapman  Resting Radionuclide: Technetium 65m Tetrofosmin  Resting Radionuclide Dose: 11.0 mCi   Stress Radionuclide:  Technetium 3m Tetrofosmin  Stress Radionuclide Dose: 33.0 mCi           Stress Protocol Rest HR: 51 Stress HR: 75  Rest BP: 103/53 Stress BP: 87/50  Exercise Time (min): n/a METS: n/a   Predicted Max HR: 159 bpm % Max HR: 54.09 bpm Rate Pressure Product: 9976   Dose of Adenosine (mg):  n/a Dose of Lexiscan: 0.4 mg  Dose of Atropine (mg): n/a Dose of Dobutamine: n/a mcg/kg/min (at max HR)  Stress Test Technologist: Cathlyn Parsons, RN  Nuclear Technologist:  Doyne Keel, CNMT     Rest Procedure:  Myocardial perfusion imaging was performed at rest 45 minutes following the intravenous administration of Technetium 62m Tetrofosmin. Rest ECG: Sinus Bradycardia  Stress Procedure:  The  patient received IV Lexiscan 0.4 mg over 15-seconds.  Technetium 81m Tetrofosmin injected at 30-seconds.  There were no significant changes with Lexiscan. Patient had occasional PAC's. Patient became nauseated in recovery with small amount clear vomitus. Quantitative spect images were obtained after a 45 minute delay. Stress ECG: No significant ST segment change suggestive of ischemia.  QPS Raw Data Images:  Acquisition technically good; mild LVE. Stress Images:  There is decreased uptake in the basal anterior wall. Rest Images:  There is decreased uptake in the basal anterior wall. Subtraction (SDS):  No evidence of ischemia. Transient Ischemic Dilatation (Normal <1.22):  1.08 Lung/Heart Ratio (Normal <0.45):  0.28  Quantitative Gated Spect Images QGS EDV:  132 ml QGS ESV:  59 ml QGS cine images:  NL LV Function; NL Wall Motion QGS EF: 55%  Impression Exercise Capacity:  Lexiscan with no exercise. BP Response:  Normal blood pressure response. Clinical Symptoms:  No chest pain. ECG Impression:  No significant ST segment change suggestive of ischemia. Comparison with Prior Nuclear Study: No images to compare  Overall Impression:  Probably normal stress nuclear study with a small fixed defect in the basal anterior wall (possibly related to soft tissue attenuation) but no significant ischemia.  Olga Millers

## 2011-11-27 ENCOUNTER — Ambulatory Visit
Admission: RE | Admit: 2011-11-27 | Discharge: 2011-11-27 | Disposition: A | Discharge status: Home / Self Care | Payer: Medicare Other | Source: Ambulatory Visit | Attending: Family Medicine | Admitting: Family Medicine

## 2011-11-27 DIAGNOSIS — R928 Other abnormal and inconclusive findings on diagnostic imaging of breast: Secondary | ICD-10-CM

## 2011-11-28 ENCOUNTER — Telehealth: Payer: Self-pay | Admitting: Cardiovascular Disease

## 2011-11-28 NOTE — Telephone Encounter (Signed)
Pt aware of myoview results 

## 2011-11-28 NOTE — Telephone Encounter (Signed)
Fu call °Pt returning your call  °

## 2011-12-07 DIAGNOSIS — I10 Essential (primary) hypertension: Secondary | ICD-10-CM | POA: Insufficient documentation

## 2011-12-07 DIAGNOSIS — E782 Mixed hyperlipidemia: Secondary | ICD-10-CM | POA: Insufficient documentation

## 2011-12-07 DIAGNOSIS — R9431 Abnormal electrocardiogram [ECG] [EKG]: Secondary | ICD-10-CM | POA: Insufficient documentation

## 2011-12-07 NOTE — Assessment & Plan Note (Signed)
The patient's EKG today is within normal limits, but she had clear ST depression on her previous tracing. In the setting of known vascular disease (carotid stenosis), I have recommended a Lexiscan Myoview pharmacologic stress imaging study to further risk-stratify this patient. She is on all appropriate medical therapy under the care of Dr Duaine Dredge with ASA, an ARB, and beta-blocker. Notes indicate she is going to be rechallenged with a statin drug as she has a history of statin-intolerance. Will follow-up with her pending the results of her stress test. If this is normal, she should continue with ongoing risk-reduction measures. Tobacco cessation was reviewed with the patient.

## 2011-12-07 NOTE — Assessment & Plan Note (Signed)
Blood pressure well-controlled on current meds. Pt on appropriate disease-specific antihypertensive drugs (ARB in setting diabetes and vascular disease).

## 2011-12-07 NOTE — Assessment & Plan Note (Signed)
Followed by Dr Hart Rochester. Plans noted for repeat carotid duplex in the Spring.

## 2012-01-05 ENCOUNTER — Ambulatory Visit: Payer: Medicare Other | Admitting: Radiation Oncology

## 2012-01-19 ENCOUNTER — Telehealth: Payer: Self-pay | Admitting: *Deleted

## 2012-01-19 ENCOUNTER — Ambulatory Visit
Admission: RE | Admit: 2012-01-19 | Discharge: 2012-01-19 | Disposition: A | Discharge status: Home / Self Care | Payer: Medicare Other | Source: Ambulatory Visit | Attending: Radiation Oncology | Admitting: Radiation Oncology

## 2012-01-19 ENCOUNTER — Ambulatory Visit: Payer: Medicare Other

## 2012-01-19 DIAGNOSIS — C549 Malignant neoplasm of corpus uteri, unspecified: Secondary | ICD-10-CM

## 2012-01-19 NOTE — Progress Notes (Signed)
Here for follow up post radiation  Pelvis in June 2012. Doing fine today. Just had some weakness  Post radiation. Mammogram benign except calcification in Nov. 2012. Not using dilator.

## 2012-01-19 NOTE — Progress Notes (Signed)
CC:   Paola A. Duard Brady, MD Reece Packer, M.D. Mosetta Putt, M.D. Juan H. Lily Peer, M.D.  DIAGNOSIS:  Recurrent endometrial cancer.  INTERVAL SINCE RADIATION THERAPY:  7 months.  NARRATIVE:  Ms. Safi comes in today for routine followup.  She clinically seems to be doing reasonably well.  The patient continues to have some back pain, but overall is much improved since her radiation therapy.  The patient takes approximately 4 oxycodone per day to control her pain.  The patient denies any numbness or weakness in her lower extremities.  The patient denies any vaginal bleeding or pelvic pain. The patient has any problems with nausea or diarrhea.  PHYSICAL EXAMINATION:  Vital Signs:  The patient's temperature is 98.0, pulse is 47, blood pressure is 106/61, weight is 216.9 pounds.  Lymph: Examination the neck and supraclavicular region reveals no evidence of adenopathy.  Lungs:  Clear to auscultation.  Heart:  Has a regular rhythm with a slightly decreased rate.  Abdomen:  Soft and nontender with normal bowel sounds.  Spine:  Palpation along the lumbar spine area reveals mild tenderness.  Neurological Examination:  Motor strength is 5/5 in the proximal and distal muscle groups of the lower extremities.  IMPRESSION AND PLAN:  Recurrent endometrial cancer, status post radiation therapy for isolated recurrence in the periaortic area.  The patient did undergo CT scans in October, which showed significant reduction in the retroperitoneal adenopathy.  There were no new problems noted.  The patient will proceed with repeat CT scan of the chest, abdomen, and pelvis later this week or early next week.  Given the patient's renal insufficiency, this will be without IV contrast.  The patient will return for routine followup in 3 months.  I have encouraged the patient to follow up with GYN Oncology in addition.    ______________________________ Billie Lade, Ph.D., M.D. JDK/MEDQ  D:   01/19/2012  T:  01/19/2012  Job:  [id number]

## 2012-02-19 ENCOUNTER — Encounter: Payer: Self-pay | Admitting: Gynecologic Oncology

## 2012-02-19 ENCOUNTER — Other Ambulatory Visit (HOSPITAL_COMMUNITY)
Admission: RE | Admit: 2012-02-19 | Discharge: 2012-02-19 | Disposition: A | Discharge status: Home / Self Care | Payer: Medicare Other | Source: Ambulatory Visit | Attending: Gynecologic Oncology | Admitting: Gynecologic Oncology

## 2012-02-19 ENCOUNTER — Ambulatory Visit: Payer: Medicare Other | Attending: Gynecologic Oncology | Admitting: Gynecologic Oncology

## 2012-02-19 VITALS — BP 132/58 | HR 68 | Temp 98.2°F | Resp 20 | Ht 65.0 in | Wt 212.5 lb

## 2012-02-19 DIAGNOSIS — C541 Malignant neoplasm of endometrium: Secondary | ICD-10-CM

## 2012-02-19 DIAGNOSIS — Z90710 Acquired absence of both cervix and uterus: Secondary | ICD-10-CM | POA: Insufficient documentation

## 2012-02-19 DIAGNOSIS — F17200 Nicotine dependence, unspecified, uncomplicated: Secondary | ICD-10-CM | POA: Insufficient documentation

## 2012-02-19 DIAGNOSIS — G609 Hereditary and idiopathic neuropathy, unspecified: Secondary | ICD-10-CM | POA: Insufficient documentation

## 2012-02-19 DIAGNOSIS — J4489 Other specified chronic obstructive pulmonary disease: Secondary | ICD-10-CM | POA: Insufficient documentation

## 2012-02-19 DIAGNOSIS — Z923 Personal history of irradiation: Secondary | ICD-10-CM | POA: Insufficient documentation

## 2012-02-19 DIAGNOSIS — M1990 Unspecified osteoarthritis, unspecified site: Secondary | ICD-10-CM | POA: Insufficient documentation

## 2012-02-19 DIAGNOSIS — E119 Type 2 diabetes mellitus without complications: Secondary | ICD-10-CM | POA: Insufficient documentation

## 2012-02-19 DIAGNOSIS — C50919 Malignant neoplasm of unspecified site of unspecified female breast: Secondary | ICD-10-CM | POA: Insufficient documentation

## 2012-02-19 DIAGNOSIS — C779 Secondary and unspecified malignant neoplasm of lymph node, unspecified: Secondary | ICD-10-CM | POA: Insufficient documentation

## 2012-02-19 DIAGNOSIS — Z882 Allergy status to sulfonamides status: Secondary | ICD-10-CM | POA: Insufficient documentation

## 2012-02-19 DIAGNOSIS — C549 Malignant neoplasm of corpus uteri, unspecified: Secondary | ICD-10-CM | POA: Insufficient documentation

## 2012-02-19 DIAGNOSIS — I10 Essential (primary) hypertension: Secondary | ICD-10-CM | POA: Insufficient documentation

## 2012-02-19 DIAGNOSIS — Z124 Encounter for screening for malignant neoplasm of cervix: Secondary | ICD-10-CM | POA: Insufficient documentation

## 2012-02-19 DIAGNOSIS — J449 Chronic obstructive pulmonary disease, unspecified: Secondary | ICD-10-CM | POA: Insufficient documentation

## 2012-02-19 DIAGNOSIS — F3289 Other specified depressive episodes: Secondary | ICD-10-CM | POA: Insufficient documentation

## 2012-02-19 DIAGNOSIS — F329 Major depressive disorder, single episode, unspecified: Secondary | ICD-10-CM | POA: Insufficient documentation

## 2012-02-19 NOTE — Progress Notes (Signed)
Consult Note: Gyn-Onc  Lauren [name] 61 y.o. female  CC:  Chief Complaint  Patient presents with  . Endometrial Cancer    Follow up    HPI: Patient is a 61 year old with a history of stage IIIc endometrioid carcinoma who underwent exploratory laparotomy TAH/BSO and appropriate staging in May of 2009. Final pathology revealed a grade 1 lesion involving the outer half of the myometrium with lymphovascular space involvement. 2/7 lymph nodes were positive for metastatic cancer with extranodal extension and these 2 lymph nodes. She underwent chemotherapy and radiation under a "sandwich" protocol had a negative post-treatment CT scan in October 2010. She was seen by Dr. Duaine Dredge for increased back pain in November of 2011. An MRI March 04, 2011 revealed a new 3.5 x 3 x 3.19 cm lesion in the left periorbital region extending from L3-L4 disc space. There was mass effect on aorta consistent with metastatic disease. She underwent radiation therapy under the care of Dr. Roselind Messier. We have not seen the patient since March of 2012. She was recently seen by Dr. Roselind Messier in January. Exam at that time was unremarkable. She was encouraged to followup with [place] at that time. She did have imaging October 2012 that showed a nice response per his note. She comes in today for followup  Interval History:  She denies any chest pain or shortness of breath. She continues to have regular back pain which is not any worse. She takes Percocet for this with good relief. She denies vaginal bleeding change in bowel or bladder habits. She does smoke a pack per day and has made no attempts to quit smoking. She states her sugars fluctuate they are typically in the 120s. Her last A1c was 6.1. She has no vasomotor symptoms and she is asking if she can stop her estrogen therapy. She has increasing psoriasis and has an appointment with dermatology in March of this year. In addition, her wheezing has gotten much worse and she knows she needs to  quit smoking. She had a mammogram a few months ago she had repeat imaging in the right breast secondary to some calcifications but ultimately they were felt to be benign. She's never had a colonoscopy and refuses. Review of Systems: 10 point review of systems is otherwise negative. She does believe her abdominal hernia is increasing in size.  Current Meds:  Outpatient Encounter Prescriptions as of 02/19/2012  Medication Sig Dispense Refill  . ALPRAZolam (XANAX) 0.5 MG tablet Take 0.5 mg by mouth at bedtime as needed.        Marland Kitchen aspirin 81 MG tablet Take 81 mg by mouth daily.        Marland Kitchen atorvastatin (LIPITOR) 10 MG tablet Take 10 mg by mouth daily.      . cephALEXin (KEFLEX) 500 MG capsule Take 500 mg by mouth 3 (three) times daily. For one week      . chlorthalidone (HYGROTON) 25 MG tablet Take 25 mg by mouth every morning.        . Cholecalciferol (VITAMIN D3) 1000 UNITS CAPS Take 1 Units by mouth daily.        . clobetasol (TEMOVATE) 0.05 % cream Apply topically 2 (two) times daily as needed.        . fish oil-omega-3 fatty acids 1000 MG capsule Take 1 g by mouth daily.        . Fluticasone-Salmeterol (ADVAIR) 250-50 MCG/DOSE AEPB Inhale 1 puff into the lungs every 12 (twelve) hours.        Marland Kitchen  gabapentin (NEURONTIN) 600 MG tablet Take 600 mg by mouth 2 (two) times daily.        Marland Kitchen glucose blood (ACCU-CHEK AVIVA PLUS) test strip 1 each by Other route as needed. Use as instructed       . ibuprofen (ADVIL,MOTRIN) 200 MG tablet Take 200 mg by mouth 2 (two) times daily as needed.        . Ibuprofen-Diphenhydramine Cit (ADVIL PM PO) Take 2 tablets by mouth daily.       . insulin glargine (LANTUS) 100 UNIT/ML injection Inject 80 Units into the skin daily at 12 noon.       Marland Kitchen losartan (COZAAR) 100 MG tablet Take 100 mg by mouth daily.        . metFORMIN (GLUMETZA) 1000 MG (MOD) 24 hr tablet Take 1,000 mg by mouth 2 (two) times daily with a meal.        . metoprolol (TOPROL-XL) 100 MG 24 hr tablet Take 100 mg  by mouth daily after breakfast.        . Multiple Vitamin (MULTIVITAMIN) tablet Take 1 tablet by mouth daily.        . Nutritional Supplements (ESTROVEN MAXIMUM STRENGTH PO) Take by mouth daily.        Marland Kitchen omeprazole (PRILOSEC) 20 MG capsule Take 20 mg by mouth daily.      Marland Kitchen oxyCODONE-acetaminophen (PERCOCET) 7.5-325 MG per tablet Take 1 tablet by mouth 4 (four) times daily as needed.        Marland Kitchen PARoxetine (PAXIL) 20 MG tablet Take 20 mg by mouth every morning.        . potassium chloride (KLOR-CON) 10 MEQ CR tablet Take 10 mEq by mouth daily.        . psyllium (REGULOID) 0.52 G capsule Take 0.52 g by mouth daily.        Marland Kitchen terazosin (HYTRIN) 10 MG capsule Take 1 tablet by mouth daily.      . VENTOLIN HFA 108 (90 BASE) MCG/ACT inhaler Inhale 2 puffs into the lungs 4 times daily.      . vitamin B-12 (CYANOCOBALAMIN) 500 MCG tablet Take 500 mcg by mouth 3 (three) times daily.        Marland Kitchen NEXIUM 40 MG capsule Take 20 mg by mouth daily.         Allergy:  Allergies  Allergen Reactions  . Sulfa Antibiotics     Unsure of reaction    Social Hx:   History   Social History  . Marital Status: Married    Spouse Name: N/A    Number of Children: N/A  . Years of Education: N/A   Occupational History  . Not on file.   Social History Main Topics  . Smoking status: Current Everyday Smoker -- 1.0 packs/day for 40 years    Types: Cigarettes  . Smokeless tobacco: Not on file   Comment: Pt refused brochures/material  . Alcohol Use: No  . Drug Use: No  . Sexually Active: Not on file   Other Topics Concern  . Not on file   Social History Narrative  . No narrative on file    Past Surgical Hx:  Past Surgical History  Procedure Date  . Abdominal hysterectomy   . Cholecystectomy     biliteral    Past Medical Hx:  Past Medical History  Diagnosis Date  . Diabetes mellitus   . Hypertension   . COPD (chronic obstructive pulmonary disease)   . Depression   . Osteoarthritis   .  Ventral hernia      postoperative since 2009  . Peripheral neuropathy     following chemotherapy  . Carotid artery occlusion   . Endometrial ca     endometrial ca dx 4/11  . 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 Stroke Mother   . Heart attack Father     Vitals:  Blood pressure 132/58, pulse 68, temperature 98.2 F (36.8 C), resp. rate 20, height 5\' 5"  (1.651 m), weight 212 lb 8 oz (96.389 kg).  Physical Exam: Well-nourished well-developed female with a productive sounding cough.  Neck: Supple no lymphadenopathy no thyromegaly.  Lungs: Clear to auscultation with scattered expiratory and inspiratory wheezes.  Cardiovascular: Regular rate and rhythm.  Abdomen: Large hernia measuring about a 15 x 15 cm to the right side of the incision. Abdomen is soft and nontender. There is no obvious masses but exam is limited by her habitus as well as the large hernia.  Groins: No lymphadenopathy.  Extremities: No edema scattered extensive psoriasis.  Pelvic: Normal external female genitalia. The vagina is markedly atrophic. The vaginal cuff is no visible lesions. The prep Pap smear was admitted without difficulty. Bimanual examination reveals no masses or nodularity. Rectal confirms.  Assessment/Plan:  61 year old with history of stage IIIc endometrial carcinoma diagnosed in 2009 the periaortic recurrence treated with radiation therapy.  Plan: 1) We will followup on the results of your Pap smear from today. We'll notify her of the results.  2) We will followup on the results of her CT scan that Dr. Roselind Messier is scheduling. She was asked to call the office when she has the CT scans so that we can lookout for the reports. 3) She will followup with her primary physician as scheduled as well as dermatology. 4) We encouraged smoking cessation.  Josha Weekley A., MD 02/19/2012, 10:47 AM

## 2012-02-19 NOTE — Patient Instructions (Signed)
Follow up with her other physicians as scheduled. Please consider quitting smoking.

## 2012-02-20 NOTE — Telephone Encounter (Signed)
xxx

## 2012-02-25 ENCOUNTER — Telehealth: Payer: Self-pay | Admitting: Gynecologic Oncology

## 2012-02-25 NOTE — Telephone Encounter (Signed)
Pt informed of pap smear results: negative.  No concerns or questions voiced.

## 2012-03-01 ENCOUNTER — Encounter: Payer: Self-pay | Admitting: Vascular Surgery

## 2012-03-02 ENCOUNTER — Encounter: Payer: Self-pay | Admitting: Vascular Surgery

## 2012-03-02 ENCOUNTER — Other Ambulatory Visit (INDEPENDENT_AMBULATORY_CARE_PROVIDER_SITE_OTHER): Payer: Medicare Other | Admitting: *Deleted

## 2012-03-02 ENCOUNTER — Ambulatory Visit (INDEPENDENT_AMBULATORY_CARE_PROVIDER_SITE_OTHER): Payer: Medicare Other | Admitting: Vascular Surgery

## 2012-03-02 VITALS — BP 133/59 | HR 51 | Resp 16 | Ht 65.0 in | Wt 214.0 lb

## 2012-03-02 DIAGNOSIS — I6529 Occlusion and stenosis of unspecified carotid artery: Secondary | ICD-10-CM

## 2012-03-02 NOTE — Progress Notes (Signed)
Subjective:     Patient ID: Lauren [name], female   DOB: [birth year], 61 y.o.   MRN: [id number]  HPI this 60 year old female returns for continued followup regarding her carotid occlusive disease. I evaluated her in September of 2012. Carotid duplex scan done at [facility] revealed proximate 70-80% stenosis in the right internal carotid artery. She denies any neurologic symptoms such as amaurosis fugax, diplopia, blurred vision, syncope, lateralizing weakness, or previous stroke.  Past Medical History  Diagnosis Date  . Diabetes mellitus   . Hypertension   . COPD (chronic obstructive pulmonary disease)   . Depression   . Osteoarthritis   . Ventral hernia     postoperative since 2009  . Peripheral neuropathy     following chemotherapy  . Carotid artery occlusion   . Endometrial ca     endometrial ca dx 4/11  . 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 Smoking status: Current Everyday Smoker -- 1.0 packs/day for 40 years    Types: Cigarettes  . Smokeless tobacco: Not on file   Comment: Pt refused brochures/material  . Alcohol Use: No    Family History  Problem Relation Age of Onset  . Stroke Mother   . Heart attack Father     Allergies  Allergen Reactions  . Sulfa Antibiotics     Unsure of reaction    Current outpatient prescriptions:ALPRAZolam (XANAX) 0.5 MG tablet, Take 0.5 mg by mouth at bedtime as needed.  , Disp: , Rfl: ;  aspirin 81 MG tablet, Take 81 mg by mouth daily.  , Disp: , Rfl: ;  atorvastatin (LIPITOR) 10 MG tablet, Take 10 mg by mouth daily., Disp: , Rfl: ;  chlorthalidone (HYGROTON) 25 MG tablet, Take 25 mg by mouth every morning.  , Disp: , Rfl:  Cholecalciferol (VITAMIN D3) 1000 UNITS CAPS, Take 1 Units by mouth daily.  , Disp: , Rfl: ;  clobetasol (TEMOVATE) 0.05 % cream, Apply topically 2 (two) times daily as needed.  , Disp: , Rfl: ;  fish oil-omega-3 fatty acids 1000 MG  capsule, Take 1 g by mouth daily.  , Disp: , Rfl: ;  Fluticasone-Salmeterol (ADVAIR) 250-50 MCG/DOSE AEPB, Inhale 1 puff into the lungs every 12 (twelve) hours.  , Disp: , Rfl:  gabapentin (NEURONTIN) 600 MG tablet, Take 600 mg by mouth 2 (two) times daily.  , Disp: , Rfl: ;  glucose blood (ACCU-CHEK AVIVA PLUS) test strip, 1 each by Other route as needed. Use as instructed , Disp: , Rfl: ;  ibuprofen (ADVIL,MOTRIN) 200 MG tablet, Take 200 mg by mouth 2 (two) times daily as needed.  , Disp: , Rfl: ;  Ibuprofen-Diphenhydramine Cit (ADVIL PM PO), Take 2 tablets by mouth daily. , Disp: , Rfl:  insulin glargine (LANTUS) 100 UNIT/ML injection, Inject 80 Units into the skin daily at 12 noon. , Disp: , Rfl: ;  losartan (COZAAR) 100 MG tablet, Take 100 mg by mouth daily.  , Disp: , Rfl: ;  metFORMIN (GLUMETZA) 1000 MG (MOD) 24 hr tablet, Take 1,000 mg by mouth 2 (two) times daily with a meal.  , Disp: , Rfl: ;  metoprolol (TOPROL-XL) 100 MG 24 hr tablet, Take 100 mg by mouth daily after breakfast.  , Disp: , Rfl:  Multiple Vitamin (MULTIVITAMIN) tablet, Take 1 tablet by mouth daily.  , Disp: , Rfl: ;  omeprazole (PRILOSEC) 20 MG capsule,  Take 20 mg by mouth daily., Disp: , Rfl: ;  oxyCODONE-acetaminophen (PERCOCET) 7.5-325 MG per tablet, Take 1 tablet by mouth 4 (four) times daily as needed.  , Disp: , Rfl: ;  PARoxetine (PAXIL) 20 MG tablet, Take 20 mg by mouth every morning.  , Disp: , Rfl:  potassium chloride (KLOR-CON) 10 MEQ CR tablet, Take 10 mEq by mouth daily.  , Disp: , Rfl: ;  psyllium (REGULOID) 0.52 G capsule, Take 0.52 g by mouth daily.  , Disp: , Rfl: ;  terazosin (HYTRIN) 10 MG capsule, Take 1 tablet by mouth daily., Disp: , Rfl: ;  VENTOLIN HFA 108 (90 BASE) MCG/ACT inhaler, Inhale 2 puffs into the lungs 4 times daily., Disp: , Rfl:  vitamin B-12 (CYANOCOBALAMIN) 500 MCG tablet, Take 500 mcg by mouth 3 (three) times daily.  , Disp: , Rfl: ;  cephALEXin (KEFLEX) 500 MG capsule, Take 500 mg by mouth 3  (three) times daily. For one week, Disp: , Rfl: ;  NEXIUM 40 MG capsule, Take 20 mg by mouth daily. , Disp: , Rfl: ;  Nutritional Supplements (ESTROVEN MAXIMUM STRENGTH PO), Take by mouth daily.  , Disp: , Rfl:   BP 133/59  Pulse 51  Resp 16  Ht 5\' 5"  (1.651 m)  Wt 214 lb (97.07 kg)  BMI 35.61 kg/m2  SpO2 98%  Body mass index is 35.61 kg/(m^2).            Review of Systems denies chest pain, dyspnea on exertion, PND, orthopnea. Does have significant back pain and peripheral neuropathy following chemotherapy. Has history of endometrial cancer and radiation for spinal tumor     Objective:   Physical Exam blood pressure 133/59 heart rate 81 respirations 16  carotid pulses 3+ no bruits audible Neurologic normal Lungs no rhonchi or wheezing Cardiovascular regular rhythm no murmurs Lower extremity 3+ femoral pulses bilaterally   Today I ordered a carotid duplex exam which I reviewed and interpreted. Right internal carotid stenosis appears to be about 60-70%. The left internal carotid is widely patent. Peak systolic velocities are study is to 25 cm/s compared to 80 cm/s and [facility].    Assessment:     Moderate right internal carotid stenosis-asymptomatic    Plan:     Return in one year for carotid duplex exam unless patient develops specific neurologic symptoms in the interim

## 2012-03-02 NOTE — Progress Notes (Signed)
Addended by: Sharee Pimple on: 03/02/2012 03:17 PM   Modules accepted: Orders

## 2012-03-04 ENCOUNTER — Telehealth: Payer: Self-pay | Admitting: Gynecologic Oncology

## 2012-03-04 NOTE — Telephone Encounter (Signed)
Pt notified about pap results: negative.  No questions or concerns voiced. 

## 2012-03-10 ENCOUNTER — Other Ambulatory Visit: Payer: Self-pay | Admitting: *Deleted

## 2012-03-10 DIAGNOSIS — I6529 Occlusion and stenosis of unspecified carotid artery: Secondary | ICD-10-CM

## 2012-03-10 NOTE — Procedures (Unsigned)
CAROTID DUPLEX EXAM  INDICATION:  Carotid stenosis.  HISTORY: Diabetes:  yes Cardiac:  no Hypertension:  yes Smoking:  yes Previous Surgery:  no CV History:  Currently asymptomatic Amaurosis Fugax No, Paresthesias No, Hemiparesis  No                                      RIGHT             LEFT Brachial systolic pressure:         165               160 Brachial Doppler waveforms:         Normal            Normal Vertebral direction of flow:        Antegrade         Antegrade DUPLEX VELOCITIES (cm/sec) CCA peak systolic                   57                68 ECA peak systolic                   300               85 ICA peak systolic                   225               68 ICA end diastolic                   79                21 PLAQUE MORPHOLOGY:                  Mixed             Mixed PLAQUE AMOUNT:                      Moderate          Minimal PLAQUE LOCATION:                    ICA               ECA, left bifurcation  IMPRESSION:  Right ICA velocity suggests 60% to 79% stenosis.  Right ECA stenosis.  Left ICA velocity suggests 1% to 39% stenosis.  Antegrade vertebral arteries bilaterally.  ___________________________________________ Quita Skye Hart [place], M.D.  EM/MEDQ  D:  03/03/2012  T:  03/03/2012  Job:  [id number]

## 2012-04-15 ENCOUNTER — Ambulatory Visit (HOSPITAL_COMMUNITY)
Admission: RE | Admit: 2012-04-15 | Discharge: 2012-04-15 | Disposition: A | Discharge status: Home / Self Care | Payer: Medicare Other | Source: Ambulatory Visit | Attending: Radiation Oncology | Admitting: Radiation Oncology

## 2012-04-15 ENCOUNTER — Telehealth: Payer: Self-pay | Admitting: Radiation Oncology

## 2012-04-15 DIAGNOSIS — N200 Calculus of kidney: Secondary | ICD-10-CM | POA: Insufficient documentation

## 2012-04-15 DIAGNOSIS — J449 Chronic obstructive pulmonary disease, unspecified: Secondary | ICD-10-CM | POA: Insufficient documentation

## 2012-04-15 DIAGNOSIS — J4489 Other specified chronic obstructive pulmonary disease: Secondary | ICD-10-CM | POA: Insufficient documentation

## 2012-04-15 DIAGNOSIS — C549 Malignant neoplasm of corpus uteri, unspecified: Secondary | ICD-10-CM | POA: Insufficient documentation

## 2012-04-15 DIAGNOSIS — I10 Essential (primary) hypertension: Secondary | ICD-10-CM | POA: Insufficient documentation

## 2012-04-15 DIAGNOSIS — K7689 Other specified diseases of liver: Secondary | ICD-10-CM | POA: Insufficient documentation

## 2012-04-15 DIAGNOSIS — R160 Hepatomegaly, not elsewhere classified: Secondary | ICD-10-CM | POA: Insufficient documentation

## 2012-04-15 DIAGNOSIS — R609 Edema, unspecified: Secondary | ICD-10-CM | POA: Insufficient documentation

## 2012-04-15 DIAGNOSIS — E041 Nontoxic single thyroid nodule: Secondary | ICD-10-CM | POA: Insufficient documentation

## 2012-04-15 DIAGNOSIS — I2510 Atherosclerotic heart disease of native coronary artery without angina pectoris: Secondary | ICD-10-CM | POA: Insufficient documentation

## 2012-04-15 DIAGNOSIS — Z9079 Acquired absence of other genital organ(s): Secondary | ICD-10-CM | POA: Insufficient documentation

## 2012-04-15 DIAGNOSIS — Z90710 Acquired absence of both cervix and uterus: Secondary | ICD-10-CM | POA: Insufficient documentation

## 2012-04-15 NOTE — Telephone Encounter (Signed)
Pt informed of CT results

## 2012-04-15 NOTE — Telephone Encounter (Deleted)
Pt informed of CT scan results.

## 2012-04-19 ENCOUNTER — Ambulatory Visit: Payer: Medicare Other | Admitting: Radiation Oncology

## 2012-05-31 ENCOUNTER — Encounter: Payer: Self-pay | Admitting: Radiation Oncology

## 2012-05-31 ENCOUNTER — Ambulatory Visit
Admission: RE | Admit: 2012-05-31 | Discharge: 2012-05-31 | Disposition: A | Discharge status: Home / Self Care | Payer: Medicare Other | Source: Ambulatory Visit | Attending: Radiation Oncology | Admitting: Radiation Oncology

## 2012-05-31 VITALS — BP 114/64 | HR 50 | Resp 18 | Wt 215.4 lb

## 2012-05-31 DIAGNOSIS — C549 Malignant neoplasm of corpus uteri, unspecified: Secondary | ICD-10-CM

## 2012-05-31 NOTE — Progress Notes (Signed)
Radiation Oncology         [phone]) [phone] ________________________________  Name: Lauren [name] MRN: [id number]  Date: 05/31/2012  DOB: 1950/12/31  Follow-Up Visit Note  CC: Carolyne Fiscal, MD, MD  Thompson Caul., MD  Diagnosis:   Recurrent endometrial cancer  Interval Since Last Radiation:  12 months,  patient received 5600 cGy directed at the periaortic area as recurrence.  Narrative:  The patient returns today for routine follow-up.  She seems to be doing reasonably well. She does have some chronic low back pain. She has some mild weakness in her right lower extremity which is a chronic problem. CT scan that April 18 of this year did show shrinkage of the soft tissue thickening posterior and to the left of the aorta. There was no new problems noted.                              ALLERGIES:  is allergic to sulfa antibiotics.  Meds: Current Outpatient Prescriptions  Medication Sig Dispense Refill  . ALPRAZolam (XANAX) 0.5 MG tablet Take 0.5 mg by mouth at bedtime as needed.        Marland Kitchen aspirin 81 MG tablet Take 81 mg by mouth daily.        Marland Kitchen atorvastatin (LIPITOR) 10 MG tablet Take 10 mg by mouth daily.      . cephALEXin (KEFLEX) 500 MG capsule Take 500 mg by mouth 3 (three) times daily. For one week      . chlorthalidone (HYGROTON) 25 MG tablet Take 25 mg by mouth every morning.        . Cholecalciferol (VITAMIN D3) 1000 UNITS CAPS Take 1 Units by mouth daily.        . clobetasol (TEMOVATE) 0.05 % cream Apply topically 2 (two) times daily as needed.        . fish oil-omega-3 fatty acids 1000 MG capsule Take 1 g by mouth daily.        . Fluticasone-Salmeterol (ADVAIR) 250-50 MCG/DOSE AEPB Inhale 1 puff into the lungs every 12 (twelve) hours.        . gabapentin (NEURONTIN) 600 MG tablet Take 600 mg by mouth 2 (two) times daily.        Marland Kitchen glucose blood (ACCU-CHEK AVIVA PLUS) test strip 1 each by Other route as needed. Use as instructed       . ibuprofen (ADVIL,MOTRIN) 200 MG tablet  Take 200 mg by mouth 2 (two) times daily as needed.        . Ibuprofen-Diphenhydramine Cit (ADVIL PM PO) Take 2 tablets by mouth daily.       . insulin glargine (LANTUS) 100 UNIT/ML injection Inject 80 Units into the skin daily at 12 noon.       Marland Kitchen losartan (COZAAR) 100 MG tablet Take 100 mg by mouth daily.        . metFORMIN (GLUMETZA) 1000 MG (MOD) 24 hr tablet Take 1,000 mg by mouth 2 (two) times daily with a meal.        . metoprolol (TOPROL-XL) 100 MG 24 hr tablet Take 100 mg by mouth daily after breakfast.        . Multiple Vitamin (MULTIVITAMIN) tablet Take 1 tablet by mouth daily.        Marland Kitchen NEXIUM 40 MG capsule Take 20 mg by mouth daily.       . Nutritional Supplements (ESTROVEN MAXIMUM STRENGTH PO) Take by mouth daily.        Marland Kitchen  omeprazole (PRILOSEC) 20 MG capsule Take 20 mg by mouth daily.      Marland Kitchen oxyCODONE-acetaminophen (PERCOCET) 7.5-325 MG per tablet Take 1 tablet by mouth 4 (four) times daily as needed.        Marland Kitchen PARoxetine (PAXIL) 20 MG tablet Take 20 mg by mouth every morning.        . potassium chloride (KLOR-CON) 10 MEQ CR tablet Take 10 mEq by mouth daily.        . psyllium (REGULOID) 0.52 G capsule Take 0.52 g by mouth daily.        Marland Kitchen terazosin (HYTRIN) 10 MG capsule Take 1 tablet by mouth daily.      . VENTOLIN HFA 108 (90 BASE) MCG/ACT inhaler Inhale 2 puffs into the lungs 4 times daily.      . vitamin B-12 (CYANOCOBALAMIN) 500 MCG tablet Take 500 mcg by mouth 3 (three) times daily.          Physical Findings: The patient is in no acute distress. Patient is alert and oriented.  weight is 215 lb 6.4 oz (97.705 kg). Her blood pressure is 114/64 and her pulse is 50. Her respiration is 18. Marland Kitchen  No palpable cervical or supraclavicular adenopathy. The lungs are clear to auscultation. The heart has regular rhythm and rate. The abdomen is soft and nontender with normal bowel sounds. Patient did not wish to have a pelvic exam and Pap smear in light of her recent exam with Dr.  Duard Brady.  Lab Findings: Lab Results  Component Value Date   WBC 12.1* 09/29/2011   HGB 12.0 09/29/2011   HCT 35.3 09/29/2011   MCV 88.0 09/29/2011   PLT 492* 09/29/2011    @LASTCHEM @  Radiographic Findings: No results found.  Impression:  Recurrent endometrial cancer with no active disease at this time.  Plan:  Followup in 6 months at which time I will likely repeat the patient's scans.  _____________________________________     Billie Lade, PhD, MD

## 2012-05-31 NOTE — Progress Notes (Signed)
HERE TODAY FOR FU OF ENDOMETRIAL CANCER.  HAD PAP WITH DR. Duard Brady .  HAVING LOWER BACK PAIN CONTROLLED BY PAIN MED, WITHOUT PAIN MED RATES 8/10.  NO BLEEDING OR VAGINAL PROBLEMS.  SHE C/O BEING VERY LETHARGIC.  APPETITE DECREASED.  HAS BEEN HAVING N/V LATELY.  DOES HAVE PAIN FROM HERNIA BUT THIS ISN'T NEW

## 2012-06-22 ENCOUNTER — Other Ambulatory Visit: Payer: Self-pay | Admitting: Family Medicine

## 2012-06-22 ENCOUNTER — Ambulatory Visit
Admission: RE | Admit: 2012-06-22 | Discharge: 2012-06-22 | Disposition: A | Discharge status: Home / Self Care | Payer: Medicare Other | Source: Ambulatory Visit | Attending: Family Medicine | Admitting: Family Medicine

## 2012-06-22 DIAGNOSIS — M25519 Pain in unspecified shoulder: Secondary | ICD-10-CM

## 2012-06-30 ENCOUNTER — Other Ambulatory Visit: Payer: Self-pay | Admitting: Family Medicine

## 2012-06-30 DIAGNOSIS — K2970 Gastritis, unspecified, without bleeding: Secondary | ICD-10-CM

## 2012-07-05 ENCOUNTER — Ambulatory Visit
Admission: RE | Admit: 2012-07-05 | Discharge: 2012-07-05 | Disposition: A | Discharge status: Home / Self Care | Payer: Medicare Other | Source: Ambulatory Visit | Attending: Family Medicine | Admitting: Family Medicine

## 2012-07-05 DIAGNOSIS — K2970 Gastritis, unspecified, without bleeding: Secondary | ICD-10-CM

## 2012-07-29 HISTORY — PX: EYE SURGERY: SHX253

## 2012-08-18 ENCOUNTER — Encounter: Payer: Self-pay | Admitting: Gynecologic Oncology

## 2012-08-18 ENCOUNTER — Ambulatory Visit: Payer: Medicare Other | Attending: Gynecologic Oncology | Admitting: Gynecologic Oncology

## 2012-08-18 ENCOUNTER — Other Ambulatory Visit (HOSPITAL_COMMUNITY)
Admission: RE | Admit: 2012-08-18 | Discharge: 2012-08-18 | Disposition: A | Discharge status: Home / Self Care | Payer: Medicare Other | Source: Ambulatory Visit | Attending: Gynecologic Oncology | Admitting: Gynecologic Oncology

## 2012-08-18 VITALS — BP 110/62 | HR 68 | Temp 98.4°F | Resp 20 | Ht 65.32 in | Wt 220.0 lb

## 2012-08-18 DIAGNOSIS — Z90710 Acquired absence of both cervix and uterus: Secondary | ICD-10-CM | POA: Insufficient documentation

## 2012-08-18 DIAGNOSIS — Z923 Personal history of irradiation: Secondary | ICD-10-CM | POA: Insufficient documentation

## 2012-08-18 DIAGNOSIS — E119 Type 2 diabetes mellitus without complications: Secondary | ICD-10-CM | POA: Insufficient documentation

## 2012-08-18 DIAGNOSIS — Z79899 Other long term (current) drug therapy: Secondary | ICD-10-CM | POA: Insufficient documentation

## 2012-08-18 DIAGNOSIS — Z9221 Personal history of antineoplastic chemotherapy: Secondary | ICD-10-CM | POA: Insufficient documentation

## 2012-08-18 DIAGNOSIS — J4489 Other specified chronic obstructive pulmonary disease: Secondary | ICD-10-CM | POA: Insufficient documentation

## 2012-08-18 DIAGNOSIS — C541 Malignant neoplasm of endometrium: Secondary | ICD-10-CM

## 2012-08-18 DIAGNOSIS — C549 Malignant neoplasm of corpus uteri, unspecified: Secondary | ICD-10-CM | POA: Insufficient documentation

## 2012-08-18 DIAGNOSIS — I10 Essential (primary) hypertension: Secondary | ICD-10-CM | POA: Insufficient documentation

## 2012-08-18 DIAGNOSIS — F17200 Nicotine dependence, unspecified, uncomplicated: Secondary | ICD-10-CM | POA: Insufficient documentation

## 2012-08-18 DIAGNOSIS — Z124 Encounter for screening for malignant neoplasm of cervix: Secondary | ICD-10-CM | POA: Insufficient documentation

## 2012-08-18 DIAGNOSIS — J449 Chronic obstructive pulmonary disease, unspecified: Secondary | ICD-10-CM | POA: Insufficient documentation

## 2012-08-18 DIAGNOSIS — Z9079 Acquired absence of other genital organ(s): Secondary | ICD-10-CM | POA: Insufficient documentation

## 2012-08-18 NOTE — Patient Instructions (Signed)
RTC 6 months

## 2012-08-18 NOTE — Progress Notes (Signed)
Consult Note: Gyn-Onc  Fredirick Maudlin 61 y.o. female  CC:  Chief Complaint  Patient presents with  . Endometrial Cancer    Follow up    HPI: Patient is a 61 year old with a history of stage IIIc endometrioid carcinoma who underwent exploratory laparotomy TAH/BSO and appropriate staging in May of 2009. Final pathology revealed a grade 1 lesion involving the outer half of the myometrium with lymphovascular space involvement. 2/7 lymph nodes were positive for metastatic cancer with extranodal extension and these 2 lymph nodes. She underwent chemotherapy and radiation under a "sandwich" protocol had a negative post-treatment CT scan in October 2010. She was seen by Dr. Duaine Dredge for increased back pain in November of 2011. An MRI March 04, 2011 revealed a new 3.5 x 3 x 3.19 cm lesion in the left periorbital region extending from L3-L4 disc space. There was mass effect on aorta consistent with metastatic disease. She underwent radiation therapy under the care of Dr. Roselind Messier. We saw the patient in 01/2012. She had a CT scan in April 2013. The lungs revealed some changes consistent with emphysema. There is a 1.1 cm thyroid nodule on the right side that was unchanged. She the best of coronary artery atherosclerosis. Within the abdomen and pelvis: She had hepatomegaly. The spleen, stomach, pancreas were normal. She had soft tissue thickening posterior and to the left of the aortic bifurcation measuring 9 mm versus 1.1 cm on her prior CT scan from October of 2012. There is no evidence of any new disease. She was recently seen by Dr. Roselind Messier in 6/13.   Interval History:  She denies any chest pain or shortness of breath. She continues to have regular back pain which is not any worse. She takes Percocet for this with good relief. She denies vaginal bleeding change in bowel or bladder habits. She has not smoked for about a month. She is looking forward to being able to get her hernia fixed with a Engineer, petroleum in  [place].  She states her sugars fluctuate they are typically in the 120s. She has no vasomotor symptoms and she is asking if she can stop her estrogen therapy. She has increasing psoriasis and has been followed by dermatology. She is UTD on her mammogram. She's never had a colonoscopy and refuses but she does do guaiac cards.  Review of Systems: 10 point review of systems is otherwise negative. She does believe her abdominal hernia is increasing in size. Her psoriasis is getting worse. About a month ago she felt a ""pop" in her right leg and has had some tingling since that time. She states that really like not feel like a pinched nerve. She will followup on this with one of her other physicians. She does sometimes feel depressed secondary to the psoriasis in her legs as she can't wear shorts and can't go to the beach. She denies any abdominal pain. She did have a few episodes of abdominal pain when she stopped her Nexium. She went back on her Nexium and her pain has resolved.  Current Meds:  Outpatient Encounter Prescriptions as of 02/19/2012  Medication Sig Dispense Refill  . ALPRAZolam (XANAX) 0.5 MG tablet Take 0.5 mg by mouth at bedtime as needed.        Marland Kitchen aspirin 81 MG tablet Take 81 mg by mouth daily.        Marland Kitchen atorvastatin (LIPITOR) 10 MG tablet Take 10 mg by mouth daily.      . cephALEXin (KEFLEX) 500 MG capsule Take  500 mg by mouth 3 (three) times daily. For one week      . chlorthalidone (HYGROTON) 25 MG tablet Take 25 mg by mouth every morning.        . Cholecalciferol (VITAMIN D3) 1000 UNITS CAPS Take 1 Units by mouth daily.        . clobetasol (TEMOVATE) 0.05 % cream Apply topically 2 (two) times daily as needed.        . fish oil-omega-3 fatty acids 1000 MG capsule Take 1 g by mouth daily.        . Fluticasone-Salmeterol (ADVAIR) 250-50 MCG/DOSE AEPB Inhale 1 puff into the lungs every 12 (twelve) hours.        . gabapentin (NEURONTIN) 600 MG tablet Take 600 mg by mouth 2 (two) times  daily.        Marland Kitchen glucose blood (ACCU-CHEK AVIVA PLUS) test strip 1 each by Other route as needed. Use as instructed       . ibuprofen (ADVIL,MOTRIN) 200 MG tablet Take 200 mg by mouth 2 (two) times daily as needed.        . Ibuprofen-Diphenhydramine Cit (ADVIL PM PO) Take 2 tablets by mouth daily.       . insulin glargine (LANTUS) 100 UNIT/ML injection Inject 80 Units into the skin daily at 12 noon.       Marland Kitchen losartan (COZAAR) 100 MG tablet Take 100 mg by mouth daily.        . metFORMIN (GLUMETZA) 1000 MG (MOD) 24 hr tablet Take 1,000 mg by mouth 2 (two) times daily with a meal.        . metoprolol (TOPROL-XL) 100 MG 24 hr tablet Take 100 mg by mouth daily after breakfast.        . Multiple Vitamin (MULTIVITAMIN) tablet Take 1 tablet by mouth daily.        . Nutritional Supplements (ESTROVEN MAXIMUM STRENGTH PO) Take by mouth daily.        Marland Kitchen omeprazole (PRILOSEC) 20 MG capsule Take 20 mg by mouth daily.      Marland Kitchen oxyCODONE-acetaminophen (PERCOCET) 7.5-325 MG per tablet Take 1 tablet by mouth 4 (four) times daily as needed.        Marland Kitchen PARoxetine (PAXIL) 20 MG tablet Take 20 mg by mouth every morning.        . potassium chloride (KLOR-CON) 10 MEQ CR tablet Take 10 mEq by mouth daily.        . psyllium (REGULOID) 0.52 G capsule Take 0.52 g by mouth daily.        Marland Kitchen terazosin (HYTRIN) 10 MG capsule Take 1 tablet by mouth daily.      . VENTOLIN HFA 108 (90 BASE) MCG/ACT inhaler Inhale 2 puffs into the lungs 4 times daily.      . vitamin B-12 (CYANOCOBALAMIN) 500 MCG tablet Take 500 mcg by mouth 3 (three) times daily.        Marland Kitchen NEXIUM 40 MG capsule Take 20 mg by mouth daily.         Allergy:  Allergies  Allergen Reactions  . Sulfa Antibiotics     Unsure of reaction    Social Hx:   History   Social History  . Marital Status: Married    Spouse Name: N/A    Number of Children: N/A  . Years of Education: N/A   Occupational History  . Not on file.   Social History Main Topics  . Smoking status:  Current Everyday Smoker -- 1.0  packs/day for 40 years    Types: Cigarettes  . Smokeless tobacco: Not on file   Comment: Pt refused brochures/material  . Alcohol Use: No  . Drug Use: No  . Sexually Active: Not on file   Other Topics Concern  . Not on file   Social History Narrative  . No narrative on file    Past Surgical Hx:  Past Surgical History  Procedure Date  . Abdominal hysterectomy   . Cholecystectomy     biliteral    Past Medical Hx:  Past Medical History  Diagnosis Date  . Diabetes mellitus   . Hypertension   . COPD (chronic obstructive pulmonary disease)   . Depression   . Osteoarthritis   . Ventral hernia     postoperative since 2009  . Peripheral neuropathy     following chemotherapy  . Carotid artery occlusion   . Endometrial ca     endometrial ca dx 4/11  . 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 Stroke Mother   . Heart attack Father     Vitals:  220 # up 8 #  Physical Exam: Well-nourished well-developed female with a productive sounding cough.  Neck: Supple no lymphadenopathy no thyromegaly.  Lungs: Clear to auscultation with scattered expiratory and inspiratory wheezes.  Cardiovascular: Regular rate and rhythm.  Abdomen: Large hernia measuring about a 25 x 25 cm to the right side of the incision. Abdomen is soft and nontender. There is no obvious masses but exam is limited by her habitus as well as the large hernia. Few scattered areas of psoriasis.  Groins: No lymphadenopathy.  Extremities: No edema scattered extensive psoriasis.  Pelvic: Normal external female genitalia. The vagina is markedly atrophic. The vaginal cuff is no visible lesions. Thin prep Pap smear was admitted without difficulty. Bimanual examination reveals no masses or nodularity. Rectal confirms.  Assessment/Plan:  61 year old with history of stage IIIc endometrial carcinoma  diagnosed in 2009 the periaortic recurrence in 2011 treated with radiation therapy.  Plan: 1) We will followup on the results of your Pap smear from today. We'll notify her of the results.  2) She will followup with Dr. Roselind Messier in 3 months. She will return to see [place] in 6 months. We have scheduled a CAT scan in October. 3) She will followup with her primary physician as scheduled as well as dermatology. 4) We congratulated her on her smoking cessation.  Valora Norell A., MD 02/19/2012, 10:47 AM

## 2012-08-24 ENCOUNTER — Telehealth: Payer: Self-pay | Admitting: Gynecologic Oncology

## 2012-08-24 NOTE — Telephone Encounter (Signed)
Pt asleep.  Pt's husband notified about pap results: negative.  No questions or concerns voiced.

## 2012-08-29 HISTORY — PX: EYE SURGERY: SHX253

## 2012-09-07 ENCOUNTER — Other Ambulatory Visit: Payer: Self-pay | Admitting: Family Medicine

## 2012-09-07 DIAGNOSIS — M545 Low back pain, unspecified: Secondary | ICD-10-CM

## 2012-09-07 DIAGNOSIS — R29898 Other symptoms and signs involving the musculoskeletal system: Secondary | ICD-10-CM

## 2012-09-07 DIAGNOSIS — M5410 Radiculopathy, site unspecified: Secondary | ICD-10-CM

## 2012-09-10 ENCOUNTER — Ambulatory Visit
Admission: RE | Admit: 2012-09-10 | Discharge: 2012-09-10 | Disposition: A | Discharge status: Home / Self Care | Payer: Medicare Other | Source: Ambulatory Visit | Attending: Family Medicine | Admitting: Family Medicine

## 2012-09-10 DIAGNOSIS — R29898 Other symptoms and signs involving the musculoskeletal system: Secondary | ICD-10-CM

## 2012-09-10 DIAGNOSIS — M545 Low back pain: Secondary | ICD-10-CM

## 2012-09-10 DIAGNOSIS — M5410 Radiculopathy, site unspecified: Secondary | ICD-10-CM

## 2012-09-16 ENCOUNTER — Other Ambulatory Visit: Payer: Self-pay | Admitting: Family Medicine

## 2012-09-16 DIAGNOSIS — M5416 Radiculopathy, lumbar region: Secondary | ICD-10-CM

## 2012-09-22 ENCOUNTER — Ambulatory Visit
Admission: RE | Admit: 2012-09-22 | Discharge: 2012-09-22 | Disposition: A | Discharge status: Home / Self Care | Payer: Medicare Other | Source: Ambulatory Visit | Attending: Family Medicine | Admitting: Family Medicine

## 2012-09-22 DIAGNOSIS — M5416 Radiculopathy, lumbar region: Secondary | ICD-10-CM

## 2012-09-22 MED ORDER — METHYLPREDNISOLONE ACETATE 40 MG/ML INJ SUSP (RADIOLOG
120.0000 mg | Freq: Once | INTRAMUSCULAR | Status: AC
  Administered 2012-09-22: 120 mg via EPIDURAL

## 2012-09-22 MED ORDER — IOHEXOL 180 MG/ML  SOLN
1.0000 mL | Freq: Once | INTRAMUSCULAR | Status: AC | PRN
  Administered 2012-09-22: 1 mL via EPIDURAL

## 2012-09-24 ENCOUNTER — Other Ambulatory Visit: Payer: Medicare Other | Admitting: Lab

## 2012-09-24 DIAGNOSIS — C541 Malignant neoplasm of endometrium: Secondary | ICD-10-CM

## 2012-09-24 LAB — BUN AND CREATININE (CC13): Creatinine: 1.2 mg/dL — ABNORMAL HIGH (ref 0.6–1.1)

## 2012-10-07 ENCOUNTER — Telehealth: Payer: Self-pay | Admitting: Gynecologic Oncology

## 2012-10-07 NOTE — Telephone Encounter (Signed)
Called to speak with the patient about scheduling an upcoming CT scan.  Pt sleeping at this time so scan arranged with husband.  CT scheduled for October 13, 2012 at 1:00pm at [facility].  Instructed to have patient NPO 4 hours prior to exam and to NOT take her metformin the morning of the exam and for 48 hours after the exam.  Husband to come by and pick up oral contrast along with instructions sheet at GYN Onc clinic before the exam.  No questions or concerns voiced.  Instructed to call for any issues or concerns.

## 2012-10-13 ENCOUNTER — Ambulatory Visit (HOSPITAL_COMMUNITY)
Admission: RE | Admit: 2012-10-13 | Discharge: 2012-10-13 | Disposition: A | Discharge status: Home / Self Care | Payer: Medicare Other | Source: Ambulatory Visit | Attending: Gynecologic Oncology | Admitting: Gynecologic Oncology

## 2012-10-13 DIAGNOSIS — Z9221 Personal history of antineoplastic chemotherapy: Secondary | ICD-10-CM | POA: Insufficient documentation

## 2012-10-13 DIAGNOSIS — R109 Unspecified abdominal pain: Secondary | ICD-10-CM | POA: Insufficient documentation

## 2012-10-13 DIAGNOSIS — N200 Calculus of kidney: Secondary | ICD-10-CM | POA: Insufficient documentation

## 2012-10-13 DIAGNOSIS — C549 Malignant neoplasm of corpus uteri, unspecified: Secondary | ICD-10-CM | POA: Insufficient documentation

## 2012-10-13 DIAGNOSIS — Z923 Personal history of irradiation: Secondary | ICD-10-CM | POA: Insufficient documentation

## 2012-10-13 DIAGNOSIS — C541 Malignant neoplasm of endometrium: Secondary | ICD-10-CM

## 2012-10-13 MED ORDER — IOHEXOL 300 MG/ML  SOLN
100.0000 mL | Freq: Once | INTRAMUSCULAR | Status: AC | PRN
  Administered 2012-10-13: 100 mL via INTRAVENOUS

## 2012-10-14 ENCOUNTER — Telehealth: Payer: Self-pay | Admitting: Gynecologic Oncology

## 2012-10-14 NOTE — Telephone Encounter (Signed)
Pt resting so husband informed of CT scan results:  No evidence of metastatic disease in the chest, abdomen, or pelvis.  No concerns or questions voiced.  Instructed to call for any needs.

## 2012-11-24 ENCOUNTER — Encounter: Payer: Self-pay | Admitting: Radiation Oncology

## 2012-11-29 ENCOUNTER — Encounter: Payer: Self-pay | Admitting: Radiation Oncology

## 2012-11-29 ENCOUNTER — Ambulatory Visit: Admission: RE | Admit: 2012-11-29 | Payer: Medicare Other | Source: Ambulatory Visit | Admitting: Radiation Oncology

## 2012-11-29 ENCOUNTER — Ambulatory Visit
Admission: RE | Admit: 2012-11-29 | Discharge: 2012-11-29 | Disposition: A | Discharge status: Home / Self Care | Payer: Medicare Other | Source: Ambulatory Visit | Attending: Radiation Oncology | Admitting: Radiation Oncology

## 2012-11-29 VITALS — BP 147/67 | HR 55 | Temp 98.2°F | Wt 239.7 lb

## 2012-11-29 DIAGNOSIS — C549 Malignant neoplasm of corpus uteri, unspecified: Secondary | ICD-10-CM

## 2012-11-29 NOTE — Progress Notes (Signed)
Patient here for follow up completion of radiation recurrent endometrial cancer June 2012.Main concern today is on-going back pain consistent with worsening disc disease seen on mri L/S 09/10/2012.Patient also has significant flare up of psoriasis.

## 2012-11-29 NOTE — Progress Notes (Signed)
Radiation Oncology         [id number]) [phone] ________________________________  Name: Lauren [name] MRN: [id number]  Date: 11/29/2012  DOB: Nov 06, 1951  Follow-Up Visit Note  CC: Carolyne Fiscal, MD  Thompson Caul., MD  Diagnosis:   Recurrent endometrial cancer  Interval Since Last Radiation:  18 months, the patient received 5600 cGy directed at the peri aortic area for recurrence  Narrative:  The patient returns today for routine follow-up.  She is having a lot of low back pain but on recent MRI was noted to have progressive facet arthropathy and new facet effusions, diffuse disc bulge.  There was no signs of cancer on the patient's MRI of the lumbar spine.  in addition she underwent a CT scan of abdomen and pelvis recently which showed no evidence of recurrence.  She denies any vaginal bleeding. She continues to have pelvic exams and Pap smears performed through gynecologic oncology.    She is also bothered by psoriasis. This seems to be more progressive and she may need to be on systemic therapy for this issue.                          ALLERGIES:  is allergic to sulfa antibiotics.  Meds: Current Outpatient Prescriptions  Medication Sig Dispense Refill  . ALPRAZolam (XANAX) 0.5 MG tablet Take 0.5 mg by mouth at bedtime as needed.        Marland Kitchen aspirin 81 MG tablet Take 81 mg by mouth daily.        Marland Kitchen atorvastatin (LIPITOR) 10 MG tablet Take 10 mg by mouth daily.      . chlorthalidone (HYGROTON) 25 MG tablet Take 25 mg by mouth every morning.        . Cholecalciferol (VITAMIN D3) 1000 UNITS CAPS Take 1 Units by mouth daily.        . clobetasol (TEMOVATE) 0.05 % cream Apply topically 2 (two) times daily as needed.        . fish oil-omega-3 fatty acids 1000 MG capsule Take 1 g by mouth daily.        . Fluticasone-Salmeterol (ADVAIR) 250-50 MCG/DOSE AEPB Inhale 1 puff into the lungs every 12 (twelve) hours.        . gabapentin (NEURONTIN) 600 MG tablet Take 600 mg by mouth 2 (two) times daily.         Marland Kitchen glucose blood (ACCU-CHEK AVIVA PLUS) test strip 1 each by Other route as needed. Use as instructed       . ibuprofen (ADVIL,MOTRIN) 200 MG tablet Take 200 mg by mouth 2 (two) times daily as needed.        . Ibuprofen-Diphenhydramine Cit (ADVIL PM PO) Take 2 tablets by mouth daily.       . insulin glargine (LANTUS) 100 UNIT/ML injection Inject 85 Units into the skin daily at 12 noon.       Marland Kitchen losartan (COZAAR) 100 MG tablet Take 100 mg by mouth daily.        . metFORMIN (GLUMETZA) 1000 MG (MOD) 24 hr tablet Take 1,000 mg by mouth 2 (two) times daily with a meal.        . metoprolol (TOPROL-XL) 100 MG 24 hr tablet Take 100 mg by mouth daily after breakfast.        . Multiple Vitamin (MULTIVITAMIN) tablet Take 1 tablet by mouth daily.        Marland Kitchen NEXIUM 40 MG capsule Take 20 mg by  mouth daily.       . nortriptyline (PAMELOR) 10 MG capsule Take 10 mg by mouth at bedtime.      Marland Kitchen omeprazole (PRILOSEC) 20 MG capsule Take 20 mg by mouth daily.      Marland Kitchen oxyCODONE-acetaminophen (PERCOCET) 7.5-325 MG per tablet Take 1 tablet by mouth 4 (four) times daily as needed.        Marland Kitchen PARoxetine (PAXIL) 20 MG tablet Take 20 mg by mouth every morning.        . potassium chloride (KLOR-CON) 10 MEQ CR tablet Take 10 mEq by mouth daily.        . psyllium (REGULOID) 0.52 G capsule Take 0.52 g by mouth daily.        Marland Kitchen terazosin (HYTRIN) 10 MG capsule Take 1 tablet by mouth daily.      . Varenicline Tartrate (CHANTIX PO) Take 1 tablet by mouth 2 (two) times daily.      . VENTOLIN HFA 108 (90 BASE) MCG/ACT inhaler Inhale 2 puffs into the lungs 4 times daily.        Physical Findings: The patient is in no acute distress. Patient is alert and oriented.  weight is 239 lb 11.2 oz (108.727 kg). Her temperature is 98.2 F (36.8 C). Her blood pressure is 147/67 and her pulse is 55. .  No no palpable cervical supraclavicular or axillary adenopathy the lungs are clear to auscultation. The heart has regular rhythm and rate  abdomen is soft and nontender with normal bowel sounds.  Significant psoriasis involving the hands and other parts of the body.  Lab Findings: Lab Results  Component Value Date   WBC 12.1* 09/29/2011   HGB 12.0 09/29/2011   HCT 35.3 09/29/2011   MCV 88.0 09/29/2011   PLT 492* 09/29/2011    @LASTCHEM @  Radiographic Findings: No results found.  Impression:  The patient is recovering from the effects of radiation.  No evidence of recurrence on recent imaging.  Plan:  Routine followup in one year at the patient's request.  _____________________________________  -----------------------------------  Billie Lade, PhD, MD

## 2012-12-03 ENCOUNTER — Telehealth: Payer: Self-pay | Admitting: *Deleted

## 2012-12-03 NOTE — Telephone Encounter (Signed)
Spoke with RN at [facility], Dr Leonie Man. Dr Darrold Span "has not seen patient since 2012, but OK from standpoint of that remote chemo for MTX. There would be no contraindications from  gyn oncology care for MTX either. But I expect derm expect derm needs to clear MTX with her PCP." Faxed Dr Darrold Span progress notes to Dr Leonie Man.

## 2012-12-07 ENCOUNTER — Other Ambulatory Visit: Payer: Self-pay | Admitting: Family Medicine

## 2012-12-07 DIAGNOSIS — M5416 Radiculopathy, lumbar region: Secondary | ICD-10-CM

## 2012-12-09 ENCOUNTER — Ambulatory Visit
Admission: RE | Admit: 2012-12-09 | Discharge: 2012-12-09 | Disposition: A | Discharge status: Home / Self Care | Payer: Medicare Other | Source: Ambulatory Visit | Attending: Family Medicine | Admitting: Family Medicine

## 2012-12-09 DIAGNOSIS — M5416 Radiculopathy, lumbar region: Secondary | ICD-10-CM

## 2012-12-09 MED ORDER — METHYLPREDNISOLONE ACETATE 40 MG/ML INJ SUSP (RADIOLOG
120.0000 mg | Freq: Once | INTRAMUSCULAR | Status: AC
  Administered 2012-12-09: 120 mg via EPIDURAL

## 2012-12-09 MED ORDER — IOHEXOL 180 MG/ML  SOLN
1.0000 mL | Freq: Once | INTRAMUSCULAR | Status: AC | PRN
  Administered 2012-12-09: 1 mL via EPIDURAL

## 2013-01-17 ENCOUNTER — Other Ambulatory Visit: Payer: Self-pay | Admitting: Family Medicine

## 2013-01-17 DIAGNOSIS — M5416 Radiculopathy, lumbar region: Secondary | ICD-10-CM

## 2013-01-20 ENCOUNTER — Ambulatory Visit
Admission: RE | Admit: 2013-01-20 | Discharge: 2013-01-20 | Disposition: A | Discharge status: Home / Self Care | Payer: Medicare Other | Source: Ambulatory Visit | Attending: Family Medicine | Admitting: Family Medicine

## 2013-01-20 DIAGNOSIS — M5416 Radiculopathy, lumbar region: Secondary | ICD-10-CM

## 2013-01-20 MED ORDER — METHYLPREDNISOLONE ACETATE 40 MG/ML INJ SUSP (RADIOLOG
120.0000 mg | Freq: Once | INTRAMUSCULAR | Status: AC
  Administered 2013-01-20: 120 mg via EPIDURAL

## 2013-01-20 MED ORDER — IOHEXOL 180 MG/ML  SOLN
1.0000 mL | Freq: Once | INTRAMUSCULAR | Status: AC | PRN
  Administered 2013-01-20: 1 mL via EPIDURAL

## 2013-01-25 ENCOUNTER — Other Ambulatory Visit: Payer: Self-pay | Admitting: Family Medicine

## 2013-01-25 DIAGNOSIS — R921 Mammographic calcification found on diagnostic imaging of breast: Secondary | ICD-10-CM

## 2013-02-07 ENCOUNTER — Ambulatory Visit
Admission: RE | Admit: 2013-02-07 | Discharge: 2013-02-07 | Disposition: A | Discharge status: Home / Self Care | Payer: Medicare Other | Source: Ambulatory Visit | Attending: Family Medicine | Admitting: Family Medicine

## 2013-02-07 DIAGNOSIS — R921 Mammographic calcification found on diagnostic imaging of breast: Secondary | ICD-10-CM

## 2013-02-16 ENCOUNTER — Ambulatory Visit: Payer: Medicare Other | Admitting: Gynecologic Oncology

## 2013-02-22 ENCOUNTER — Other Ambulatory Visit (HOSPITAL_COMMUNITY)
Admission: RE | Admit: 2013-02-22 | Discharge: 2013-02-22 | Disposition: A | Discharge status: Home / Self Care | Payer: Medicare Other | Source: Ambulatory Visit | Attending: Gynecologic Oncology | Admitting: Gynecologic Oncology

## 2013-02-22 ENCOUNTER — Encounter: Payer: Self-pay | Admitting: Gynecologic Oncology

## 2013-02-22 ENCOUNTER — Ambulatory Visit: Payer: Medicare Other | Attending: Gynecologic Oncology | Admitting: Gynecologic Oncology

## 2013-02-22 VITALS — Temp 98.2°F | Ht 65.32 in | Wt 248.8 lb

## 2013-02-22 DIAGNOSIS — L408 Other psoriasis: Secondary | ICD-10-CM | POA: Insufficient documentation

## 2013-02-22 DIAGNOSIS — C775 Secondary and unspecified malignant neoplasm of intrapelvic lymph nodes: Secondary | ICD-10-CM | POA: Insufficient documentation

## 2013-02-22 DIAGNOSIS — Z9079 Acquired absence of other genital organ(s): Secondary | ICD-10-CM | POA: Insufficient documentation

## 2013-02-22 DIAGNOSIS — Z09 Encounter for follow-up examination after completed treatment for conditions other than malignant neoplasm: Secondary | ICD-10-CM | POA: Insufficient documentation

## 2013-02-22 DIAGNOSIS — Z7982 Long term (current) use of aspirin: Secondary | ICD-10-CM | POA: Insufficient documentation

## 2013-02-22 DIAGNOSIS — Z79899 Other long term (current) drug therapy: Secondary | ICD-10-CM | POA: Insufficient documentation

## 2013-02-22 DIAGNOSIS — J4489 Other specified chronic obstructive pulmonary disease: Secondary | ICD-10-CM | POA: Insufficient documentation

## 2013-02-22 DIAGNOSIS — Z9221 Personal history of antineoplastic chemotherapy: Secondary | ICD-10-CM | POA: Insufficient documentation

## 2013-02-22 DIAGNOSIS — Z90710 Acquired absence of both cervix and uterus: Secondary | ICD-10-CM | POA: Insufficient documentation

## 2013-02-22 DIAGNOSIS — J449 Chronic obstructive pulmonary disease, unspecified: Secondary | ICD-10-CM | POA: Insufficient documentation

## 2013-02-22 DIAGNOSIS — C549 Malignant neoplasm of corpus uteri, unspecified: Secondary | ICD-10-CM | POA: Insufficient documentation

## 2013-02-22 DIAGNOSIS — C779 Secondary and unspecified malignant neoplasm of lymph node, unspecified: Secondary | ICD-10-CM | POA: Insufficient documentation

## 2013-02-22 DIAGNOSIS — C541 Malignant neoplasm of endometrium: Secondary | ICD-10-CM

## 2013-02-22 DIAGNOSIS — E119 Type 2 diabetes mellitus without complications: Secondary | ICD-10-CM | POA: Insufficient documentation

## 2013-02-22 DIAGNOSIS — K458 Other specified abdominal hernia without obstruction or gangrene: Secondary | ICD-10-CM | POA: Insufficient documentation

## 2013-02-22 DIAGNOSIS — Z124 Encounter for screening for malignant neoplasm of cervix: Secondary | ICD-10-CM | POA: Insufficient documentation

## 2013-02-22 DIAGNOSIS — Z87891 Personal history of nicotine dependence: Secondary | ICD-10-CM | POA: Insufficient documentation

## 2013-02-22 DIAGNOSIS — I10 Essential (primary) hypertension: Secondary | ICD-10-CM | POA: Insufficient documentation

## 2013-02-22 DIAGNOSIS — Z923 Personal history of irradiation: Secondary | ICD-10-CM | POA: Insufficient documentation

## 2013-02-22 DIAGNOSIS — C50919 Malignant neoplasm of unspecified site of unspecified female breast: Secondary | ICD-10-CM | POA: Insufficient documentation

## 2013-02-22 NOTE — Progress Notes (Signed)
Consult Note: Gyn-Onc  Lauren [name] 62 y.o. female  CC:  Chief Complaint  Patient presents with  . Endometrial cancer    Follow up    HPI: Patient is a 62 year old with a history of stage IIIc endometrioid carcinoma who underwent exploratory laparotomy TAH/BSO and appropriate staging in May of 2009. Final pathology revealed a grade 1 lesion involving the outer half of the myometrium with lymphovascular space involvement. 2/7 lymph nodes were positive for metastatic cancer with extranodal extension and these 2 lymph nodes. She underwent chemotherapy and radiation under a "sandwich" protocol had a negative post-treatment CT scan in October 2010. She was seen by Dr. Duaine Dredge for increased back pain in November of 2011. An MRI March 04, 2011 revealed a new 3.5 x 3 x 3.19 cm lesion in the left periorbital region extending from L3-L4 disc space. There was mass effect on aorta consistent with metastatic disease. She underwent radiation therapy under the care of Dr. Roselind Messier. We saw the patient in 01/2012. She had a CT scan in April 2013. The lungs revealed some changes consistent with emphysema. There is a 1.1 cm thyroid nodule on the right side that was unchanged. She the best of coronary artery atherosclerosis. Within the abdomen and pelvis: She had hepatomegaly. The spleen, stomach, pancreas were normal. She had soft tissue thickening posterior and to the left of the aortic bifurcation measuring 9 mm versus 1.1 cm on her prior CT scan from October of 2012. There is no evidence of any new disease. She was recently seen by Dr. Roselind Messier in 12/13 and I last saw her in August of 2013 at which time her exam was negative. She had a CT scan in October 2013 that revealed no evidence of recurrent or metastatic carcinoma or other acute findings. She had stable right-sided nephrolithiasis with no evidence of hydronephrosis.  Interval History:  She denies any chest pain or shortness of breath. She continues to have  regular back pain which is not any worse. She takes Percocet for this with good relief. She denies vaginal bleeding change in bowel or bladder habits. She has not smoked for >6 months. She is looking forward to being able to get her hernia fixed with a Engineer, petroleum in [place]. She states her sugars fluctuate they are typically in the 120s.  She has increasing psoriasis and has been followed by dermatology. She is UTD on her mammogram they saw some calcifications on the right breast which are stable and she has follow up in one year. She's never had a colonoscopy and refuses but she does do guaiac cards.   Review of Systems: 10 point review of systems is otherwise negative. She does believe her abdominal hernia is increasing in size. Her psoriasis is getting worse.  She denies any abdominal pain. She did have a few episodes of abdominal pain when she stopped her Nexium. She went back on her Nexium and her pain has resolved. She has gained about 20 pounds since we last saw her. She states that she only had a taste for sweet and has not been eating very well. Sherrill able to exercise due to her other medical issues. She states that she really needs to make a concerted effort to lose weight. She denies any nausea, vomiting, fever, chills. She has a chest pain or shortness of breath. She denies any early satiety she knows that we'll do one more CT scan in April of this year which we 2 years from the time of  diagnosis of recurrence and that is fine with this and knows that we will then get them on a when necessary basis. I discussed with her that the CT scan can be used by her plastic surgeon for planning of a hernia repair. She was pleased to hear this.    Current Meds:  Outpatient Encounter Prescriptions as of 02/22/2013  Medication Sig Dispense Refill  . ALPRAZolam (XANAX) 0.5 MG tablet Take 0.5 mg by mouth at bedtime as needed.        Marland Kitchen aspirin 81 MG tablet Take 81 mg by mouth daily.        Marland Kitchen  atorvastatin (LIPITOR) 10 MG tablet Take 10 mg by mouth daily.      . betamethasone dipropionate (DIPROLENE) 0.05 % ointment Apply topically 2 (two) times daily.      . chlorthalidone (HYGROTON) 25 MG tablet Take 25 mg by mouth every morning.        . Cholecalciferol (VITAMIN D3) 1000 UNITS CAPS Take 1 Units by mouth daily.        . clobetasol (TEMOVATE) 0.05 % cream Apply topically 2 (two) times daily as needed.        . Cyanocobalamin (VITAMIN B-12 CR) 1500 MCG TBCR Take by mouth daily.      Marland Kitchen esomeprazole (NEXIUM) 40 MG capsule Take 40 mg by mouth daily before breakfast.      . fish oil-omega-3 fatty acids 1000 MG capsule Take 1 g by mouth daily.        . fluocinonide cream (LIDEX) 0.05 % Apply topically 2 (two) times daily.      . Fluticasone-Salmeterol (ADVAIR) 250-50 MCG/DOSE AEPB Inhale 1 puff into the lungs every 12 (twelve) hours.        . gabapentin (NEURONTIN) 600 MG tablet Take 600 mg by mouth 2 (two) times daily.        Marland Kitchen glucose blood (ACCU-CHEK AVIVA PLUS) test strip 1 each by Other route as needed. Use as instructed       . ibuprofen (ADVIL,MOTRIN) 200 MG tablet Take 200 mg by mouth 2 (two) times daily as needed.        . insulin glargine (LANTUS) 100 UNIT/ML injection Inject 85 Units into the skin daily at 12 noon.       Marland Kitchen losartan (COZAAR) 100 MG tablet Take 100 mg by mouth daily.        . metFORMIN (GLUMETZA) 1000 MG (MOD) 24 hr tablet Take 1,000 mg by mouth 2 (two) times daily with a meal.        . methotrexate (RHEUMATREX) 2.5 MG tablet Take 10 mg by mouth once a week. Caution:Chemotherapy. Protect from light.      . metoprolol (TOPROL-XL) 100 MG 24 hr tablet Take 100 mg by mouth daily after breakfast.        . Multiple Vitamin (MULTIVITAMIN) tablet Take 1 tablet by mouth daily.        Marland Kitchen NEXIUM 40 MG capsule Take 20 mg by mouth daily.       . nortriptyline (PAMELOR) 10 MG capsule Take 10 mg by mouth at bedtime.      Marland Kitchen oxyCODONE-acetaminophen (PERCOCET) 7.5-325 MG per tablet  Take 1 tablet by mouth 4 (four) times daily as needed.        Marland Kitchen PARoxetine (PAXIL) 20 MG tablet Take 20 mg by mouth every morning.        . potassium chloride (KLOR-CON) 10 MEQ CR tablet Take 10 mEq by mouth daily.        Marland Kitchen  psyllium (REGULOID) 0.52 G capsule Take 0.52 g by mouth daily.        . Varenicline Tartrate (CHANTIX PO) Take 1 tablet by mouth 2 (two) times daily.      . VENTOLIN HFA 108 (90 BASE) MCG/ACT inhaler Inhale 2 puffs into the lungs 4 times daily.      . Ibuprofen-Diphenhydramine Cit (ADVIL PM PO) Take 2 tablets by mouth daily.       Marland Kitchen terazosin (HYTRIN) 10 MG capsule Take 1 tablet by mouth daily.      . [DISCONTINUED] omeprazole (PRILOSEC) 20 MG capsule Take 20 mg by mouth daily.       No facility-administered encounter medications on file as of 02/22/2013.    Allergy:  Allergies  Allergen Reactions  . Sulfa Antibiotics     Unsure of reaction    Social Hx:   History   Social History  . Marital Status: Married    Spouse Name: N/A    Number of Children: N/A  . Years of Education: N/A   Occupational History  . Not on file.   Social History Main Topics  . Smoking status: Former Smoker -- 1.00 packs/day for 40 years    Types: Cigarettes    Quit date: 07/26/2012  . Smokeless tobacco: Never Used  . Alcohol Use: No  . Drug Use: No  . Sexually Active: No   Other Topics Concern  . Not on file   Social History Narrative  . No narrative on file    Past Surgical Hx:  Past Surgical History  Procedure Laterality Date  . Abdominal hysterectomy    . Cholecystectomy      biliteral  . Cataract extraction      2013    Past Medical Hx:  Past Medical History  Diagnosis Date  . Diabetes mellitus   . Hypertension   . COPD (chronic obstructive pulmonary disease)   . Depression   . Osteoarthritis   . Ventral hernia     postoperative since 2009  . Peripheral neuropathy     following chemotherapy  . Carotid artery occlusion   . Endometrial ca      endometrial ca dx 4/11  . 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 Radiation 05/01/2011-06/11/11    5600 cGy 28 fxs/periaortic    Family Hx:  Family History  Problem Relation Age of Onset  . Stroke Mother   . Heart attack Father     Vitals:  Temperature 98.2 F (36.8 C), height 5' 5.32" (1.659 m), weight 248 lb 12.8 oz (112.855 kg).  Physical Exam:  Well-nourished well-developed female  Neck: Supple no lymphadenopathy no thyromegaly.   Lungs: Clear to auscultation.  Cardiovascular: Regular rate and rhythm.   Abdomen: Large hernia measuring about a 25 x 25 cm to the right side of the incision. Abdomen is soft and nontender. There is no obvious masses but exam is limited by her habitus as well as the large hernia. Few scattered areas of psoriasis.   Groins: No lymphadenopathy.   Extremities: No edema scattered extensive psoriasis.   Pelvic: Normal external female genitalia. The vagina is markedly atrophic. The vaginal cuff is no visible lesions. Thin prep Pap smear was admitted without difficulty. Bimanual examination reveals no masses or nodularity. Rectal confirms.   Assessment/Plan:  62 year old with history of stage IIIc endometrial carcinoma diagnosed in 2009 the periaortic recurrence in 2011 treated with radiation therapy.  Plan:  1) We will  followup on the results of your Pap smear from today. We'll notify her of the results.  2) She will followup with Dr. Roselind Messier in 3 months. She will return to see [place] in 6 months. We have scheduled a CT scan in 4/14. 3) She will followup with her primary physician as scheduled as well as dermatology.  4) We congratulated her on her smoking cessation and encouraged her to focus on weight loss.   Assessment/Plan:   Thompson Caul., MD 02/22/2013, 3:25 PM

## 2013-02-22 NOTE — Patient Instructions (Signed)
Return to clinic in 6 months. Follow up with other MDs as scheduled. CT scan in 4/14.

## 2013-03-02 ENCOUNTER — Encounter: Payer: Self-pay | Admitting: Neurosurgery

## 2013-03-02 ENCOUNTER — Telehealth: Payer: Self-pay | Admitting: *Deleted

## 2013-03-02 NOTE — Telephone Encounter (Signed)
Patient informed of PAP results  

## 2013-03-03 ENCOUNTER — Other Ambulatory Visit (INDEPENDENT_AMBULATORY_CARE_PROVIDER_SITE_OTHER): Payer: Medicare Other | Admitting: *Deleted

## 2013-03-03 ENCOUNTER — Encounter: Payer: Self-pay | Admitting: Neurosurgery

## 2013-03-03 ENCOUNTER — Ambulatory Visit (INDEPENDENT_AMBULATORY_CARE_PROVIDER_SITE_OTHER): Payer: Medicare Other | Admitting: Neurosurgery

## 2013-03-03 DIAGNOSIS — I6529 Occlusion and stenosis of unspecified carotid artery: Secondary | ICD-10-CM

## 2013-03-03 NOTE — Progress Notes (Signed)
[facility] Carotid Office Note  CC: Carotid surveillance Referring Physician: Hart Rochester  History of Present Illness: 62 year old female patient of Dr. Hart Rochester followed for known carotid stenosis. Patient denies any signs or symptoms of CVA, TIA, amaurosis fugax. The patient denies any new medical diagnoses or recent surgery.  Past Medical History  Diagnosis Date  . Diabetes mellitus   . Hypertension   . COPD (chronic obstructive pulmonary disease)   . Depression   . Osteoarthritis   . Ventral hernia     postoperative since 2009  . Peripheral neuropathy     following chemotherapy  . Carotid artery occlusion   . Endometrial ca     endometrial ca dx 4/11  . 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 Radiation 05/01/2011-06/11/11    5600 cGy 28 fxs/periaortic    ROS: [x]  Positive   [ ]  Denies    General: [ ]  Weight loss, [ ]  Fever, [ ]  chills Neurologic: [x ] Dizziness, [ ]  Blackouts, [ ]  Seizure [ ]  Stroke, [ ]  "Mini stroke", [ ]  Slurred speech, [ ]  Temporary blindness; [ ]  weakness in arms or legs, [ ]  Hoarseness Cardiac: [ ]  Chest pain/pressure, [ ]  Shortness of breath at rest [ ]  Shortness of breath with exertion, [ ]  Atrial fibrillation or irregular heartbeat Vascular: [x ] Pain in legs with walking, [ x] Pain in legs at rest, [ ]  Pain in legs at night,  [ ]  Non-healing ulcer, [ ]  Blood clot in vein/DVT,   Pulmonary: [ ]  Home oxygen, [ ]  Productive cough, [ ]  Coughing up blood, [x ] Asthma,  [ ]  Wheezing Musculoskeletal:  [ ]  Arthritis, [ ]  Low back pain, [ ]  Joint pain Hematologic: [ ]  Easy Bruising, [ ]  Anemia; [ ]  Hepatitis Gastrointestinal: [ ]  Blood in stool, [ ]  Gastroesophageal Reflux/heartburn, [ ]  Trouble swallowing Urinary: [ ]  chronic Kidney disease, [ ]  on HD - [ ]  MWF or [ ]  TTHS, [ ]  Burning with urination, [ ]  Difficulty urinating Skin: [ ]  Rashes, [ ]  Wounds Psychological: [ ]  Anxiety, [ ]   Depression   Social History History  Substance Use Topics  . Smoking status: Former Smoker -- 1.00 packs/day for 40 years    Types: Cigarettes    Quit date: 07/26/2012  . Smokeless tobacco: Never Used  . Alcohol Use: No    Family History Family History  Problem Relation Age of Onset  . Stroke Mother   . Heart attack Father   . Heart disease Father     Heart Disease before age 40  . Hyperlipidemia Father   . Hypertension Father     Allergies  Allergen Reactions  . Sulfa Antibiotics     Unsure of reaction    Current Outpatient Prescriptions  Medication Sig Dispense Refill  . ALPRAZolam (XANAX) 0.5 MG tablet Take 0.5 mg by mouth at bedtime as needed.        Marland Kitchen aspirin 81 MG tablet Take 81 mg by mouth daily.        Marland Kitchen atorvastatin (LIPITOR) 10 MG tablet Take 10 mg by mouth daily.      . betamethasone dipropionate (DIPROLENE) 0.05 % ointment Apply topically 2 (two) times daily.      . chlorthalidone (HYGROTON) 25 MG tablet Take 25 mg by mouth every morning.        . Cholecalciferol (VITAMIN D3) 1000 UNITS CAPS Take 1 Units by  mouth daily.        . clobetasol (TEMOVATE) 0.05 % cream Apply topically 2 (two) times daily as needed.        Marland Kitchen esomeprazole (NEXIUM) 40 MG capsule Take 40 mg by mouth 2 (two) times daily.       . fish oil-omega-3 fatty acids 1000 MG capsule Take 1 g by mouth daily.        . fluocinonide cream (LIDEX) 0.05 % Apply topically 2 (two) times daily.      . Fluticasone-Salmeterol (ADVAIR) 250-50 MCG/DOSE AEPB Inhale 1 puff into the lungs every 12 (twelve) hours.        . gabapentin (NEURONTIN) 600 MG tablet Take 600 mg by mouth 2 (two) times daily.        Marland Kitchen glucose blood (ACCU-CHEK AVIVA PLUS) test strip 1 each by Other route as needed. Use as instructed       . ibuprofen (ADVIL,MOTRIN) 200 MG tablet Take 200 mg by mouth 2 (two) times daily as needed.        . Ibuprofen-Diphenhydramine Cit (ADVIL PM PO) Take 2 tablets by mouth daily.       . insulin glargine  (LANTUS) 100 UNIT/ML injection Inject 85 Units into the skin daily at 12 noon.       Marland Kitchen losartan (COZAAR) 100 MG tablet Take 100 mg by mouth daily.        . metFORMIN (GLUMETZA) 1000 MG (MOD) 24 hr tablet Take 1,000 mg by mouth 2 (two) times daily with a meal.        . methotrexate (RHEUMATREX) 2.5 MG tablet Take 10 mg by mouth once a week. Caution:Chemotherapy. Protect from light.      . metoprolol (TOPROL-XL) 100 MG 24 hr tablet Take 100 mg by mouth daily after breakfast.        . Multiple Vitamin (MULTIVITAMIN) tablet Take 1 tablet by mouth daily.        Marland Kitchen NEXIUM 40 MG capsule Take 20 mg by mouth daily.       . nortriptyline (PAMELOR) 10 MG capsule Take 10 mg by mouth at bedtime.      Marland Kitchen oxyCODONE-acetaminophen (PERCOCET) 7.5-325 MG per tablet Take 1 tablet by mouth 4 (four) times daily as needed.        Marland Kitchen PARoxetine (PAXIL) 20 MG tablet Take 20 mg by mouth every morning.        . potassium chloride (KLOR-CON) 10 MEQ CR tablet Take 10 mEq by mouth daily.        . psyllium (REGULOID) 0.52 G capsule Take 0.52 g by mouth daily.        . Varenicline Tartrate (CHANTIX PO) Take 1 tablet by mouth 2 (two) times daily.      . VENTOLIN HFA 108 (90 BASE) MCG/ACT inhaler Inhale 2 puffs into the lungs 4 times daily.      . Cyanocobalamin (VITAMIN B-12 CR) 1500 MCG TBCR Take by mouth daily.      Marland Kitchen terazosin (HYTRIN) 10 MG capsule Take 1 tablet by mouth daily.       No current facility-administered medications for this visit.    Physical Examination  Filed Vitals:   03/03/13 1149  BP: 140/73  Pulse: 72  Resp:     Body mass index is 38.43 kg/(m^2).  General:  WDWN in NAD Gait: Normal HEENT: WNL Eyes: Pupils equal Pulmonary: normal non-labored breathing , without Rales, rhonchi,  wheezing Cardiac: RRR, without  Murmurs, rubs or gallops; Abdomen:  soft, NT, no masses Skin: no rashes, ulcers noted  Vascular Exam Pulses: 3+ radial pulses bilaterally Carotid bruits: Carotid pulse heard on the  left with a mild bruit on the right Extremities without ischemic changes, no Gangrene , no cellulitis; no open wounds;  Musculoskeletal: no muscle wasting or atrophy   Neurologic: A&O X 3; Appropriate Affect ; SENSATION: normal; MOTOR FUNCTION:  moving all extremities equally. Speech is fluent/normal  Non-Invasive Vascular Imaging CAROTID DUPLEX 03/03/2013  Right ICA 60 - 79 % stenosis Left ICA 20 - 39 % stenosis   ASSESSMENT/PLAN: Asymptomatic patient with unchanged carotid duplex from 6 months ago. The patient will followup in 6 months with repeat carotid duplex. The patient's questions were encouraged and answered, she is in agreement with this plan. The patient knows the signs and symptoms of CVA and knows to call 911 should this occur.  Lauree Chandler ANP   Clinic MD: Darrick Penna

## 2013-03-03 NOTE — Addendum Note (Signed)
Addended by: Dannielle Karvonen on: 03/03/2013 03:33 PM   Modules accepted: Orders

## 2013-03-17 ENCOUNTER — Other Ambulatory Visit: Payer: Self-pay | Admitting: Family Medicine

## 2013-03-17 DIAGNOSIS — M545 Low back pain: Secondary | ICD-10-CM

## 2013-03-17 DIAGNOSIS — R29898 Other symptoms and signs involving the musculoskeletal system: Secondary | ICD-10-CM

## 2013-03-20 ENCOUNTER — Ambulatory Visit
Admission: RE | Admit: 2013-03-20 | Discharge: 2013-03-20 | Disposition: A | Discharge status: Home / Self Care | Payer: Medicare Other | Source: Ambulatory Visit | Attending: Family Medicine | Admitting: Family Medicine

## 2013-03-20 DIAGNOSIS — R29898 Other symptoms and signs involving the musculoskeletal system: Secondary | ICD-10-CM

## 2013-03-20 DIAGNOSIS — M545 Low back pain: Secondary | ICD-10-CM

## 2013-04-25 ENCOUNTER — Encounter (HOSPITAL_COMMUNITY): Payer: Self-pay

## 2013-04-25 ENCOUNTER — Ambulatory Visit (HOSPITAL_COMMUNITY)
Admission: RE | Admit: 2013-04-25 | Discharge: 2013-04-25 | Disposition: A | Discharge status: Home / Self Care | Payer: Medicare Other | Source: Ambulatory Visit | Attending: Gynecologic Oncology | Admitting: Gynecologic Oncology

## 2013-04-25 DIAGNOSIS — C549 Malignant neoplasm of corpus uteri, unspecified: Secondary | ICD-10-CM | POA: Insufficient documentation

## 2013-04-25 DIAGNOSIS — C541 Malignant neoplasm of endometrium: Secondary | ICD-10-CM

## 2013-04-25 DIAGNOSIS — N200 Calculus of kidney: Secondary | ICD-10-CM | POA: Insufficient documentation

## 2013-04-25 DIAGNOSIS — Z9221 Personal history of antineoplastic chemotherapy: Secondary | ICD-10-CM | POA: Insufficient documentation

## 2013-04-25 DIAGNOSIS — K439 Ventral hernia without obstruction or gangrene: Secondary | ICD-10-CM | POA: Insufficient documentation

## 2013-04-25 MED ORDER — IOHEXOL 300 MG/ML  SOLN
100.0000 mL | Freq: Once | INTRAMUSCULAR | Status: AC | PRN
  Administered 2013-04-25: 100 mL via INTRAVENOUS

## 2013-04-28 ENCOUNTER — Telehealth: Payer: Self-pay | Admitting: Gynecologic Oncology

## 2013-04-28 NOTE — Telephone Encounter (Signed)
Patient notified of CT scan results.  No concerns voiced.  CT results routed to Dr. Duaine Dredge per patient request via EPIC.  Pt instructed to call for any questions or concerns.

## 2013-06-14 ENCOUNTER — Encounter: Payer: Self-pay | Admitting: Cardiovascular Disease

## 2013-06-15 ENCOUNTER — Encounter: Payer: Self-pay | Admitting: Cardiovascular Disease

## 2013-06-15 ENCOUNTER — Ambulatory Visit (INDEPENDENT_AMBULATORY_CARE_PROVIDER_SITE_OTHER): Payer: Medicare Other | Admitting: Cardiovascular Disease

## 2013-06-15 VITALS — BP 134/76 | HR 68 | Ht 66.0 in | Wt 231.4 lb

## 2013-06-15 DIAGNOSIS — M47817 Spondylosis without myelopathy or radiculopathy, lumbosacral region: Secondary | ICD-10-CM

## 2013-06-15 DIAGNOSIS — F329 Major depressive disorder, single episode, unspecified: Secondary | ICD-10-CM

## 2013-06-15 DIAGNOSIS — E785 Hyperlipidemia, unspecified: Secondary | ICD-10-CM

## 2013-06-15 DIAGNOSIS — E119 Type 2 diabetes mellitus without complications: Secondary | ICD-10-CM

## 2013-06-15 DIAGNOSIS — I209 Angina pectoris, unspecified: Secondary | ICD-10-CM

## 2013-06-15 DIAGNOSIS — Z01818 Encounter for other preprocedural examination: Secondary | ICD-10-CM

## 2013-06-15 DIAGNOSIS — R9431 Abnormal electrocardiogram [ECG] [EKG]: Secondary | ICD-10-CM

## 2013-06-15 DIAGNOSIS — I2089 Other forms of angina pectoris: Secondary | ICD-10-CM | POA: Insufficient documentation

## 2013-06-15 DIAGNOSIS — M47816 Spondylosis without myelopathy or radiculopathy, lumbar region: Secondary | ICD-10-CM | POA: Insufficient documentation

## 2013-06-15 DIAGNOSIS — J449 Chronic obstructive pulmonary disease, unspecified: Secondary | ICD-10-CM

## 2013-06-15 DIAGNOSIS — I10 Essential (primary) hypertension: Secondary | ICD-10-CM

## 2013-06-15 DIAGNOSIS — I208 Other forms of angina pectoris: Secondary | ICD-10-CM

## 2013-06-15 DIAGNOSIS — I779 Disorder of arteries and arterioles, unspecified: Secondary | ICD-10-CM

## 2013-06-15 DIAGNOSIS — K439 Ventral hernia without obstruction or gangrene: Secondary | ICD-10-CM | POA: Insufficient documentation

## 2013-06-15 MED ORDER — NITROGLYCERIN 0.4 MG SL SUBL: 0.4000 mg | SUBLINGUAL_TABLET | SUBLINGUAL | Status: DC | PRN

## 2013-06-15 NOTE — Assessment & Plan Note (Signed)
Whilst the EKG abnormalities in January were fairly mild, but there is clear improvement on today's EKG. This supports the suspicion for coronary disease.

## 2013-06-15 NOTE — Assessment & Plan Note (Signed)
My impression is that Lauren [name] is trying to minimize her symptoms. It actually sounds like she has exertional angina fairly limited activity. She has poor functional status related to her lumbar spine disease and COPD and could easily have extensive coronary insufficiency with out prominent symptoms. She clearly has established atherosclerosis with significant carotid stenoses. She has almost every coronary risk factor there is until recently many of these such as her smoking and diabetes has not been well controlled. The likelihood of underlying coronary disease is high. She has an abnormal electrocardiogram in January, but it has now normalized.  On the other hand the worse complaints that she currently has are related to her back. Lumbar spine disease is not a particularly high-risk surgical procedure for people with coronary disease. She has not had symptoms to suggest an unstable coronary syndrome or congestive heart failure. Consequently, I do not think that we have to rush to coronary angiography and revascularization. Indeed it is likely that we will find CAD at the time of angiography. If we commit her to a percutaneous revascularization procedure this would probably involve a drug-eluting stent. In turn this would delay her lumbar spine surgery for up to 12 months.  I suggested that we start with a Lexiscan Myoview study. If she has extensive reversible defects, especially if these involve the anterior wall, she should undergo coronary angiography and lumbar spine surgery should be delayed. If on the other hand there are no major reversible perfusion defects I would recommend that she proceed with her lumbar spine surgery while we attempt to achieve optimal medical management of coronary disease. Either way it is very important that she continue taking beta blocker therapy. It is critical that this not be interrupted at the time of lumbar spine surgery.  I have spent some time educating the  patient and her husband about the difference between exertional angina and acute coronary events. Have advised that they should seek emergency medical for any chest discomfort her last more than 20 minutes especially if it begins at rest.

## 2013-06-15 NOTE — Patient Instructions (Addendum)
Your physician has requested that you have a lexiscan myoview. For further information please visit [email or web address] Please follow instruction sheet, as given.  Appointment within 2 weeks for test results with a physicians assistant.  Prescription given for NTG.  This is to be used for chest discomfort.  Place one under your tongue and  Allow to dissolve.  If after 5 minutes pain is not relieved placed another under your tongue.  If after another 5 minutes  Place another NTG under your tongue.  If the 3rd NTG does not relieve chest discomfort call 911.

## 2013-06-16 ENCOUNTER — Telehealth: Payer: Self-pay | Admitting: *Deleted

## 2013-06-16 ENCOUNTER — Ambulatory Visit (HOSPITAL_COMMUNITY)
Admission: RE | Admit: 2013-06-16 | Discharge: 2013-06-16 | Disposition: A | Discharge status: Home / Self Care | Payer: Medicare Other | Source: Ambulatory Visit | Attending: Cardiovascular Disease | Admitting: Cardiovascular Disease

## 2013-06-16 DIAGNOSIS — I208 Other forms of angina pectoris: Secondary | ICD-10-CM

## 2013-06-16 DIAGNOSIS — R42 Dizziness and giddiness: Secondary | ICD-10-CM | POA: Insufficient documentation

## 2013-06-16 DIAGNOSIS — R079 Chest pain, unspecified: Secondary | ICD-10-CM | POA: Insufficient documentation

## 2013-06-16 DIAGNOSIS — R002 Palpitations: Secondary | ICD-10-CM | POA: Insufficient documentation

## 2013-06-16 DIAGNOSIS — R9431 Abnormal electrocardiogram [ECG] [EKG]: Secondary | ICD-10-CM

## 2013-06-16 DIAGNOSIS — I209 Angina pectoris, unspecified: Secondary | ICD-10-CM

## 2013-06-16 MED ORDER — TECHNETIUM TC 99M SESTAMIBI GENERIC - CARDIOLITE
29.1000 | Freq: Once | INTRAVENOUS | Status: AC | PRN
  Administered 2013-06-16: 29.1 via INTRAVENOUS

## 2013-06-16 MED ORDER — REGADENOSON 0.4 MG/5ML IV SOLN
0.4000 mg | Freq: Once | INTRAVENOUS | Status: AC
  Administered 2013-06-16: 0.4 mg via INTRAVENOUS

## 2013-06-16 MED ORDER — TECHNETIUM TC 99M SESTAMIBI GENERIC - CARDIOLITE
9.9000 | Freq: Once | INTRAVENOUS | Status: AC | PRN
  Administered 2013-06-16: 10 via INTRAVENOUS

## 2013-06-16 NOTE — Assessment & Plan Note (Signed)
Since her lumbar spine disease limits functional status, perioperative risk is indeterminate. Reevaluate once cardiac nuclear perfusion data is available

## 2013-06-16 NOTE — Telephone Encounter (Signed)
Results of nuc study called to patient and notified she has been cleared cardiac-wise for the back surgery.  Clearance faxed to Dr. Noel Gerold and at the patients request a copy to DR. Blomgren.

## 2013-06-16 NOTE — Assessment & Plan Note (Signed)
The most recent lipid profile from January 2014 shows triglycerides 209, LDL cholesterol 77, HDL cholesterol 41, total cholesterol 147 with an unfavorable LDL density pattern B; low levels of large HDL 2 and low levels of ApoA1 as well. She is on appropriate lipid lowering therapy. Further improvement in diabetes control may help

## 2013-06-16 NOTE — Procedures (Addendum)
[facility] [address] [place] [address] [phone]  Cardiology Nuclear Med Study  Lauren [name] is a 62 y.o. female     MRN : [id number]     DOB: 19-Mar-1951  Procedure Date: 06/16/2013  Nuclear Med Background Indication for Stress Test:  Evaluation for Ischemia and Surgical Clearance History:  COPD Cardiac Risk Factors: Carotid Disease, Family History - CAD, History of Smoking, Hypertension, IDDM Type 2, Lipids, Obesity and PVD  Symptoms:  Chest Pain, Dizziness, DOE, Fatigue, Palpitations and SOB   Nuclear Pre-Procedure Caffeine/Decaff Intake:  8:00pm NPO After: 6:00am   IV Site: R Antecubital  IV 0.9% NS with Angio Cath:  22g  Chest Size (in):  n/a IV Started by: Emmit Pomfret, RN  Height: 5\' 6"  (1.676 m)  Cup Size: c  BMI:  Body mass index is 37.3 kg/(m^2). Weight:  231 lb (104.781 kg)   Tech Comments:  n/a    Nuclear Med Study 1 or 2 day study: 1 day  Stress Test Type:  Lexiscan  Order Authorizing Provider:  Thurmon Fair, MD   Resting Radionuclide: Technetium 52m Sestamibi  Resting Radionuclide Dose: 9.9 mCi   Stress Radionuclide:  Technetium 76m Sestamibi  Stress Radionuclide Dose: 29.1 mCi           Stress Protocol Rest HR: 60 Stress HR: 70  Rest BP: 141/65 Stress BP: 127/50  Exercise Time (min): n/a METS: n/a   Predicted Max HR: 160 bpm % Max HR: 43.75 bpm Rate Pressure Product: 9870  Dose of Adenosine (mg):  n/a Dose of Lexiscan: 0.4 mg  Dose of Atropine (mg): n/a Dose of Dobutamine: n/a mcg/kg/min (at max HR)  Stress Test Technologist: Esperanza Sheets, CCT Nuclear Technologist: Gonzella Lex, CNMT   Rest Procedure:  Myocardial perfusion imaging was performed at rest 45 minutes following the intravenous administration of Technetium 30m Sestamibi. Stress Procedure:  The patient received IV Lexiscan 0.4 mg over 15-seconds.  Technetium 39m Sestamibi injected at 30-seconds.  There were no  significant changes with Lexiscan.  Quantitative spect images were obtained after a 45 minute delay.  Transient Ischemic Dilatation (Normal <1.22):  1.10 Lung/Heart Ratio (Normal <0.45):  0.23 QGS EDV:  120 ml QGS ESV:  53 ml LV Ejection Fraction: 56%     Rest ECG: NSR - Normal EKG  Stress ECG: No significant change from baseline ECG  QPS Raw Data Images:  There is a breast shadow that accounts for the anterior attenuation. Stress Images:  mildly reduced anteroapical uptake Rest Images:  Comparison with the stress images reveals no significant change. Subtraction (SDS):  No evidence of ischemia.  Impression Exercise Capacity:  Lexiscan with no exercise. BP Response:  Normal blood pressure response. Clinical Symptoms:  No significant symptoms noted. ECG Impression:  No significant ECG changes with Lexiscan. Comparison with Prior Nuclear Study: No previous nuclear study performed  Overall Impression:  Normal stress nuclear study. Low risk for perioperative complications with non-cardiac surgery.  LV Wall Motion:  NL LV Function; NL Wall Motion   Abdulah Iqbal, MD  06/16/2013 4:33 PM

## 2013-06-16 NOTE — Assessment & Plan Note (Addendum)
Control is fair. She requires multiple agents for blood pressure control. Beta blockers and RAAS inhibitors are important part of her regimen. Target 130/72 the presence of diabetes mellitus possible mild renal insufficiency

## 2013-06-16 NOTE — Assessment & Plan Note (Signed)
She has severe L4-L5 spondylosis with narrowing of both the central canal and right foraminal narrowing with marked deterioration in functional status and quality of life. She clearly needs lumbar spine surgical correction. This should not be delayed unless her coronary findings are truly impressive.

## 2013-06-16 NOTE — Progress Notes (Signed)
Patient ID: Lauren [name], female   DOB: August 01, 1951, 62 y.o.   MRN: [id number]     Reason for office visit Consultation from Dr.Blomgrem Exertional angina, abnormal electrocardiogram, preoperative risk evaluation.   Lauren [name] is a 62 year old obese woman with severely symptomatic lumbar spinal stenosis planning to undergo surgical correction by Dr. Noel Gerold in the near future.  She has recently developed some chest symptoms. It takes a little bit of cajoling to obtain the full picture from Lauren [name]. She appears to try to minimize Lauren [name] complaints since she feels she really needs Lauren [name] back surgery and has not wanted to delay. Lauren [name] husband has for some time prompt Lauren [name] to tell us the whole story.  It appears that over the last 6 months she has been developing a throbbing discomfort in Lauren [name] chest. This is usually brought on by walking but it doesn't take a lot of activity. Simply walking to the bathroom and back to Lauren [name] bed at night often bring the symptoms on. She has to sit down and relax and take several deep breaths to allow the discomforts to dissipate. It usually lasts for several minutes. She describes as a throbbing tightness in the upper retrosternal area. Associated with dyspnea. It has never occurred at rest..  Lauren [name] activity level is extremely sedentary duty to lumbar spine related leg pain. She has diabetes mellitus, hypertension, hyperlipidemia (mixed) and only recently quit smoking. Lauren [name] father had premature death from heart disease.  She has known atherosclerosis with moderate stenosis of the right internal carotid artery, followed by Dr. Darrick Penna. She has never had a stroke. There is no evidence of lower extremity arterial stenoses by brachial index measurement. She has never had a stroke, myocardial infarction or amputation.  She has a very large ventral hernia that is not incarcerated or strangulated. She tells me that surgery for the hernia has been not been performed standard lung  problems    Allergies  Allergen Reactions  . Sulfa Antibiotics     Unsure of reaction    Current Outpatient Prescriptions  Medication Sig Dispense Refill  . ALPRAZolam (XANAX) 0.5 MG tablet Take 0.5 mg by mouth at bedtime as needed.        Marland Kitchen aspirin 81 MG tablet Take 81 mg by mouth daily.        Marland Kitchen atorvastatin (LIPITOR) 10 MG tablet Take 10 mg by mouth daily.      . betamethasone dipropionate (DIPROLENE) 0.05 % ointment Apply topically 2 (two) times daily.      . chlorthalidone (HYGROTON) 25 MG tablet Take 25 mg by mouth every morning.        . Cholecalciferol (VITAMIN D3) 1000 UNITS CAPS Take 1 Units by mouth daily.        . clobetasol (TEMOVATE) 0.05 % cream Apply topically 2 (two) times daily as needed.        . Cyanocobalamin (VITAMIN B-12 CR) 1500 MCG TBCR Take by mouth daily.      Marland Kitchen escitalopram (LEXAPRO) 20 MG tablet Take 20 mg by mouth daily.      Marland Kitchen esomeprazole (NEXIUM) 40 MG capsule Take 40 mg by mouth 2 (two) times daily.       . fish oil-omega-3 fatty acids 1000 MG capsule Take 1 g by mouth daily.        . fluocinonide cream (LIDEX) 0.05 % Apply topically 2 (two) times daily.      . Fluticasone-Salmeterol (ADVAIR) 250-50 MCG/DOSE AEPB Inhale 1 puff into the lungs every 12 (twelve)  hours.        . gabapentin (NEURONTIN) 600 MG tablet Take 600 mg by mouth 2 (two) times daily.        Marland Kitchen glucose blood (ACCU-CHEK AVIVA PLUS) test strip 1 each by Other route as needed. Use as instructed       . ibuprofen (ADVIL,MOTRIN) 200 MG tablet Take 200 mg by mouth 2 (two) times daily as needed.        . Ibuprofen-Diphenhydramine Cit (ADVIL PM PO) Take 2 tablets by mouth as needed.       . insulin glargine (LANTUS) 100 UNIT/ML injection Inject 100 Units into the skin daily.       Marland Kitchen losartan (COZAAR) 100 MG tablet Take 100 mg by mouth daily.        . metFORMIN (GLUMETZA) 1000 MG (MOD) 24 hr tablet Take 1,000 mg by mouth 2 (two) times daily with a meal.        . methotrexate (RHEUMATREX) 2.5  MG tablet Take 15 mg by mouth once a week. Caution:Chemotherapy. Protect from light.      . metoprolol (TOPROL-XL) 100 MG 24 hr tablet Take 100 mg by mouth daily after breakfast.        . Multiple Vitamin (MULTIVITAMIN) tablet Take 1 tablet by mouth daily.        . nortriptyline (PAMELOR) 10 MG capsule Take 10 mg by mouth at bedtime.      Marland Kitchen oxyCODONE-acetaminophen (PERCOCET) 7.5-325 MG per tablet Take 1 tablet by mouth 4 (four) times daily as needed.        . potassium chloride (KLOR-CON) 10 MEQ CR tablet Take 10 mEq by mouth daily.        . psyllium (REGULOID) 0.52 G capsule Take 0.52 g by mouth daily.        . sitaGLIPtin (JANUVIA) 100 MG tablet Take 100 mg by mouth daily.      . Varenicline Tartrate (CHANTIX PO) Take 1 tablet by mouth 2 (two) times daily.      . VENTOLIN HFA 108 (90 BASE) MCG/ACT inhaler Inhale 2 puffs into the lungs 4 times daily.      . nitroGLYCERIN (NITROSTAT) 0.4 MG SL tablet Place 1 tablet (0.4 mg total) under the tongue every 5 (five) minutes as needed for chest pain.  25 tablet  3   No current facility-administered medications for this visit.    Past Medical History  Diagnosis Date  . Diabetes mellitus   . Hypertension   . COPD (chronic obstructive pulmonary disease)   . Depression   . Osteoarthritis   . Ventral hernia     postoperative since 2009  . Peripheral neuropathy     following chemotherapy  . Carotid artery occlusion   . Endometrial ca     endometrial ca dx 4/11  . 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 Radiation 05/01/2011-06/11/11    5600 cGy 28 fxs/periaortic    Past Surgical History  Procedure Laterality Date  . Abdominal hysterectomy    . Cholecystectomy      biliteral  . Cataract extraction      2013  . Eye surgery Right Aug. 2013    Cataract  . Eye surgery Left Sept. 2013    Cataract    Family History  Problem Relation Age of Onset  . Stroke Mother   . Heart attack Father   . Heart disease Father      Heart Disease before  age 85  . Hyperlipidemia Father   . Hypertension Father     History   Social History  . Marital Status: Married    Spouse Name: N/A    Number of Children: N/A  . Years of Education: N/A   Occupational History  . Not on file.   Social History Main Topics  . Smoking status: Former Smoker -- 1.00 packs/day for 40 years    Types: Cigarettes    Quit date: 03/15/2013  . Smokeless tobacco: Never Used  . Alcohol Use: No  . Drug Use: No  . Sexually Active: No   Other Topics Concern  . Not on file   Social History Narrative  . No narrative on file    Review of systems: Review of Systems  Constitutional: Positive for malaise/fatigue. Negative for fever, chills and weight loss.  HENT: Positive for hearing loss and neck pain. Negative for nosebleeds and sore throat.   Eyes: Negative for blurred vision and double vision.  Respiratory: Positive for cough, shortness of breath and wheezing. Negative for hemoptysis and sputum production.   Cardiovascular: Positive for chest pain. Negative for palpitations, orthopnea, leg swelling and PND.  Gastrointestinal: Negative for heartburn, nausea, vomiting, abdominal pain, diarrhea, constipation and blood in stool.  Genitourinary: Negative for urgency, frequency and hematuria.  Musculoskeletal: Positive for back pain and joint pain.  Skin: Negative for rash.  Neurological: Positive for weakness. Negative for speech change, focal weakness, seizures and loss of consciousness.  Endo/Heme/Allergies: Negative for polydipsia. Does not bruise/bleed easily.  Psychiatric/Behavioral: Positive for depression and memory loss. Negative for suicidal ideas. The patient is nervous/anxious and has insomnia.      PHYSICAL EXAM BP 134/76  Pulse 68  Ht 5\' 6"  (1.676 m)  Wt 104.962 kg (231 lb 6.4 oz)  BMI 37.37 kg/m2 General appearance: alert, cooperative, no distress and moderately obese Neck: no adenopathy, no JVD, supple,  symmetrical, trachea midline, thyroid not enlarged, symmetric, no tenderness/mass/nodules and I. lateral carotid bruit Lungs: clear to auscultation bilaterally Heart: regular rate and rhythm, S1, S2 normal, no murmur, click, rub or gallop Abdomen: normal findings: bowel sounds normal, no bruits heard, no masses palpable and Very large right lower quadrant hernia and Liver and spleen nonpalpable Extremities: extremities normal, atraumatic, no cyanosis or edema and no ulcers, gangrene or trophic changes Pulses: 2+ and symmetric Skin: Skin color, texture, turgor normal. No rashes or lesions Neurologic: Grossly normal   EKG: Electric cardiogram from January shows sinus rhythm with sagging ST segments in leads 66F V5 and V6 complete the normalized on electrocardiogram seen today. The QT interval is normal.    BMET    Component Value Date/Time   NA 142 09/29/2011 1016   K 4.0 09/29/2011 1016   CL 103 09/29/2011 1016   CO2 25 09/29/2011 1016   GLUCOSE 92 09/29/2011 1016   BUN 30.0* 09/24/2012 1025   BUN 29* 09/29/2011 1016   CREATININE 1.2* 09/24/2012 1025   CREATININE 1.99* 09/29/2011 1016   CALCIUM 9.6 09/29/2011 1016     ASSESSMENT AND PLAN Exertional angina My impression is that Lauren [name] is trying to minimize Lauren [name] symptoms. It actually sounds like she has exertional angina fairly limited activity. She has poor functional status related to Lauren [name] lumbar spine disease and COPD and could easily have extensive coronary insufficiency with out prominent symptoms. She clearly has established atherosclerosis with significant carotid stenoses. She has almost every coronary risk factor there is until recently many of these such as  Lauren [name] smoking and diabetes has not been well controlled. The likelihood of underlying coronary disease is high. She has an abnormal electrocardiogram in January, but it has now normalized.  On the other hand the worse complaints that she currently has are related to Lauren [name] back.  Lumbar spine disease is not a particularly high-risk surgical procedure for people with coronary disease. She has not had symptoms to suggest an unstable coronary syndrome or congestive heart failure. Consequently, I do not think that we have to rush to coronary angiography and revascularization. Indeed it is likely that we will find CAD at the time of angiography. If we commit Lauren [name] to a percutaneous revascularization procedure this would probably involve a drug-eluting stent. In turn this would delay Lauren [name] lumbar spine surgery for up to 12 months.  I suggested that we start with a Lexiscan Myoview study. If she has extensive reversible defects, especially if these involve the anterior wall, she should undergo coronary angiography and lumbar spine surgery should be delayed. If on the other hand there are no major reversible perfusion defects I would recommend that she proceed with Lauren [name] lumbar spine surgery while we attempt to achieve optimal medical management of coronary disease. Either way it is very important that she continue taking beta blocker therapy. It is critical that this not be interrupted at the time of lumbar spine surgery.  I have spent some time educating the patient and Lauren [name] husband about the difference between exertional angina and acute coronary events. Have advised that they should seek emergency medical for any chest discomfort Lauren [name] last more than 20 minutes especially if it begins at rest.  Abnormal EKG Whilst the EKG abnormalities in January were fairly mild, but there is clear improvement on today's EKG. This supports the suspicion for coronary disease.  Essential hypertension, benign Control is fair. She requires multiple agents for blood pressure control. Beta blockers and RAAS inhibitors are important part of Lauren [name] regimen. Target 130/72 the presence of diabetes mellitus possible mild renal insufficiency  Hyperlipidemia The most recent lipid profile from January 2014 shows  triglycerides 209, LDL cholesterol 77, HDL cholesterol 41, total cholesterol 147 with an unfavorable LDL density pattern B; low levels of large HDL 2 and low levels of ApoA1 as well. She is on appropriate lipid lowering therapy. Further improvement in diabetes control may help  Carotid artery disease without cerebral infarction 60-79% stenosis of the right proximal internal carotid artery and less than 40% stenosis of the left proximal internal carotid artery (study from March 2014 described as stable when compared with March 2013  DM2 (diabetes mellitus, type 2) Recently control has been improving your hemoglobin A1c was 7.8% early this year but more recently has come down to 7.2%  DJD (degenerative joint disease) of lumbar spine She has severe L4-L5 spondylosis with narrowing of both the central canal and right foraminal narrowing with marked deterioration in functional status and quality of life. She clearly needs lumbar spine surgical correction. This should not be delayed unless Lauren [name] coronary findings are truly impressive.  Preoperative evaluation Since Lauren [name] lumbar spine disease limits functional status, perioperative risk is indeterminate. Reevaluate once cardiac nuclear perfusion data is available  Orders Placed This Encounter  Procedures  . Myocardial Perfusion Imaging  . EKG 12-Lead   Meds ordered this encounter  Medications  . escitalopram (LEXAPRO) 20 MG tablet    Sig: Take 20 mg by mouth daily.  . sitaGLIPtin (JANUVIA) 100 MG tablet    Sig: Take 100 mg by  mouth daily.  . nitroGLYCERIN (NITROSTAT) 0.4 MG SL tablet    Sig: Place 1 tablet (0.4 mg total) under the tongue every 5 (five) minutes as needed for chest pain.    Dispense:  25 tablet    Refill:  3    Rollen Selders  Thurmon Fair, MD, [facility] [phone] office [phone] pager

## 2013-06-16 NOTE — Assessment & Plan Note (Signed)
60-79% stenosis of the right proximal internal carotid artery and less than 40% stenosis of the left proximal internal carotid artery (study from March 2014 described as stable when compared with March 2013

## 2013-06-16 NOTE — Assessment & Plan Note (Signed)
Recently control has been improving your hemoglobin A1c was 7.8% early this year but more recently has come down to 7.2%

## 2013-08-05 ENCOUNTER — Ambulatory Visit
Admission: RE | Admit: 2013-08-05 | Discharge: 2013-08-05 | Disposition: A | Discharge status: Home / Self Care | Payer: Medicare Other | Source: Ambulatory Visit | Attending: Orthopaedic Surgery | Admitting: Orthopaedic Surgery

## 2013-08-05 ENCOUNTER — Other Ambulatory Visit: Payer: Self-pay | Admitting: Orthopaedic Surgery

## 2013-08-05 DIAGNOSIS — M549 Dorsalgia, unspecified: Secondary | ICD-10-CM

## 2013-08-05 MED ORDER — GADOBENATE DIMEGLUMINE 529 MG/ML IV SOLN
20.0000 mL | Freq: Once | INTRAVENOUS | Status: AC | PRN
  Administered 2013-08-05: 20 mL via INTRAVENOUS

## 2013-08-10 ENCOUNTER — Telehealth: Payer: Self-pay | Admitting: *Deleted

## 2013-08-10 NOTE — Telephone Encounter (Signed)
Returned a call to Mr. Potier. A voice mail was left on office VM by Mr. Allyne Gee stating that Mrs. Kolle was a patient at the [facility] due to a back surgery and need to cancel her future appt on 8/14 with Dr. Duard Brady. The appointment was rescheduled until 9/25  @ 12:00 with Dr. Duard Brady. Mr Losasso agreed with this appointment.

## 2013-08-11 ENCOUNTER — Ambulatory Visit: Payer: Medicare Other | Admitting: Gynecologic Oncology

## 2013-09-06 ENCOUNTER — Other Ambulatory Visit: Payer: Medicare Other

## 2013-09-06 ENCOUNTER — Ambulatory Visit: Payer: Medicare Other | Admitting: Family

## 2013-09-22 ENCOUNTER — Ambulatory Visit: Payer: Medicare Other | Admitting: Gynecologic Oncology

## 2013-10-14 ENCOUNTER — Other Ambulatory Visit (HOSPITAL_COMMUNITY): Payer: Self-pay | Admitting: Vascular Surgery

## 2013-10-14 ENCOUNTER — Other Ambulatory Visit: Payer: Self-pay | Admitting: Vascular Surgery

## 2013-11-01 ENCOUNTER — Other Ambulatory Visit (HOSPITAL_COMMUNITY): Payer: Medicare Other

## 2013-11-01 ENCOUNTER — Ambulatory Visit: Payer: Medicare Other | Admitting: Family

## 2013-11-03 ENCOUNTER — Encounter (INDEPENDENT_AMBULATORY_CARE_PROVIDER_SITE_OTHER): Payer: Self-pay

## 2013-11-03 ENCOUNTER — Other Ambulatory Visit (HOSPITAL_COMMUNITY)
Admission: RE | Admit: 2013-11-03 | Discharge: 2013-11-03 | Disposition: A | Discharge status: Home / Self Care | Payer: Medicare Other | Source: Ambulatory Visit | Attending: Gynecologic Oncology | Admitting: Gynecologic Oncology

## 2013-11-03 ENCOUNTER — Ambulatory Visit: Payer: Medicare Other | Attending: Gynecologic Oncology | Admitting: Gynecologic Oncology

## 2013-11-03 ENCOUNTER — Encounter: Payer: Self-pay | Admitting: Gynecologic Oncology

## 2013-11-03 VITALS — BP 112/64 | HR 80 | Temp 98.3°F | Resp 16 | Ht 65.32 in | Wt 208.8 lb

## 2013-11-03 DIAGNOSIS — J4489 Other specified chronic obstructive pulmonary disease: Secondary | ICD-10-CM | POA: Insufficient documentation

## 2013-11-03 DIAGNOSIS — K439 Ventral hernia without obstruction or gangrene: Secondary | ICD-10-CM | POA: Insufficient documentation

## 2013-11-03 DIAGNOSIS — Z124 Encounter for screening for malignant neoplasm of cervix: Secondary | ICD-10-CM | POA: Insufficient documentation

## 2013-11-03 DIAGNOSIS — Z923 Personal history of irradiation: Secondary | ICD-10-CM | POA: Insufficient documentation

## 2013-11-03 DIAGNOSIS — Z90710 Acquired absence of both cervix and uterus: Secondary | ICD-10-CM | POA: Insufficient documentation

## 2013-11-03 DIAGNOSIS — C549 Malignant neoplasm of corpus uteri, unspecified: Secondary | ICD-10-CM | POA: Insufficient documentation

## 2013-11-03 DIAGNOSIS — I7090 Unspecified atherosclerosis: Secondary | ICD-10-CM | POA: Insufficient documentation

## 2013-11-03 DIAGNOSIS — L408 Other psoriasis: Secondary | ICD-10-CM | POA: Insufficient documentation

## 2013-11-03 DIAGNOSIS — J449 Chronic obstructive pulmonary disease, unspecified: Secondary | ICD-10-CM | POA: Insufficient documentation

## 2013-11-03 DIAGNOSIS — Z79899 Other long term (current) drug therapy: Secondary | ICD-10-CM | POA: Insufficient documentation

## 2013-11-03 DIAGNOSIS — Z9079 Acquired absence of other genital organ(s): Secondary | ICD-10-CM | POA: Insufficient documentation

## 2013-11-03 DIAGNOSIS — E041 Nontoxic single thyroid nodule: Secondary | ICD-10-CM | POA: Insufficient documentation

## 2013-11-03 DIAGNOSIS — I10 Essential (primary) hypertension: Secondary | ICD-10-CM | POA: Insufficient documentation

## 2013-11-03 DIAGNOSIS — E119 Type 2 diabetes mellitus without complications: Secondary | ICD-10-CM | POA: Insufficient documentation

## 2013-11-03 DIAGNOSIS — C541 Malignant neoplasm of endometrium: Secondary | ICD-10-CM

## 2013-11-03 DIAGNOSIS — M549 Dorsalgia, unspecified: Secondary | ICD-10-CM | POA: Insufficient documentation

## 2013-11-03 DIAGNOSIS — Z9221 Personal history of antineoplastic chemotherapy: Secondary | ICD-10-CM | POA: Insufficient documentation

## 2013-11-03 NOTE — Patient Instructions (Signed)
Followup with Dr. Roselind Messier in April 2015 after her CT scan for restaging. I will see her in August of 2015.

## 2013-11-03 NOTE — Progress Notes (Signed)
Consult Note: Gyn-Onc  Lauren [name] 62 y.o. female  CC:  Chief Complaint  Patient presents with  . Endometrial Cancer    Follow up    HPI: Patient is a 62 year old with a history of stage IIIc endometrioid carcinoma who underwent exploratory laparotomy TAH/BSO and appropriate staging in May of 2009. Final pathology revealed a grade 1 lesion involving the outer half of the myometrium with lymphovascular space involvement. 2/7 lymph nodes were positive for metastatic cancer with extranodal extension and these 2 lymph nodes. She underwent chemotherapy and radiation under a "sandwich" protocol had a negative post-treatment CT scan in October 2010. She was seen by Dr. Duaine Dredge for increased back pain in November of 2011. An MRI March 04, 2011 revealed a new 3.5 x 3 x 3.19 cm lesion in the left periorbital region extending from L3-L4 disc space. There was mass effect on aorta consistent with metastatic disease. She underwent radiation therapy under the care of Dr. Roselind Messier. We saw the patient in 01/2012. She had a CT scan in April 2013. The lungs revealed some changes consistent with emphysema. There is a 1.1 cm thyroid nodule on the right side that was unchanged. She the best of coronary artery atherosclerosis. Within the abdomen and pelvis: She had hepatomegaly. The spleen, stomach, pancreas were normal. She had soft tissue thickening posterior and to the left of the aortic bifurcation measuring 9 mm versus 1.1 cm on her prior CT scan from October of 2012. There is no evidence of any new disease. She was recently seen by Dr. Roselind Messier in 12/13 and I last saw her in August of 2013 at which time her exam was negative. She had a CT scan in October 2013 that revealed no evidence of recurrent or metastatic carcinoma or other acute findings. She had stable right-sided nephrolithiasis with no evidence of hydronephrosis.   Interval History:  She denies any chest pain or shortness of breath. She continues to have  regular back pain which is not any worse. She takes Percocet for this with good relief. She denies vaginal bleeding change in bowel or bladder habits. She has not smoked for >6 months. She is looking forward to being able to get her hernia fixed with a Engineer, petroleum in [place]. She states her sugars fluctuate they are typically in the 120s. She has increasing psoriasis and has been followed by dermatology. She is UTD on her mammogram they saw some calcifications on the right breast which are stable and she has follow up in one year. She's never had a colonoscopy and refuses but she does do guaiac cards.   Interval History:  CT 4/14 Comparison: 10/13/2012.  Findings: The lung bases are clear. No pleural effusion or pulmonary nodule. The heart is normal in size. No pericardial effusion. The liver is unremarkable. No focal hepatic lesions or intrahepatic biliary dilatation. The gallbladder is surgically absent. Mild associated common bile duct dilatation. The pancreas is normal except for atrophy. The spleen is normal in size. No focal lesions. The adrenal glands and kidneys are unremarkable. A lower pole right renal calculus is noted and there are renal artery calcifications.   The stomach is not well distended with contrast but no gross abnormalities are seen. The duodenum, small bowel and colon are unremarkable. No inflammatory changes or mass lesions. There is a moderate amount of stool throughout the colon. Advanced atherosclerotic calcifications involving the aorta and iliac vessels but no focal aneurysm or dissection. The major branch vessels are patent with scattered  atherosclerotic calcifications.  There is a large anterior abdominal wall hernia containing small bowel and colon but no obstructive findings. Surgical changes from the previous hysterectomy and bilateral salpingo-oophorectomy. Minimal left adnexal scarring changes are noted. No pelvic mass, adenopathy or free pelvic fluid collections.  No mesenteric or retroperitoneal mass or adenopathy. There is a large amount of air in the bladder. This may be from a recent procedure/instrumentation. Recommend clinical correlation. No obvious colovesicle fistula. The bony structures are intact. No destructive bone lesions or spinal canal compromise.  IMPRESSION:  1. No CT findings to suggest recurrent or metastatic abdominal/pelvic disease.  2. Large amount of air in the bladder likely from recent instrumentation/catheterization. Recommend clinical correlation.  3. Large anterior abdominal wall hernia is stable. No obstructive  findings.  4. Advanced atherosclerotic calcifications for age.  Interval History: Since we last saw her she had 2 back surgeries in September 2014 in October 2014. She's taking a fairly significant amount of pain medications. The patient's report it sounds like the placed hardware in her back in September it became infected. Should a PICC line with antibiotics and was 3 months is currently on antibiotics for the next 6 weeks. She has followup with the surgeon in 2 weeks to see if she will require any additional surgery over she can begin physical therapy. She is using a walker and a cane. She has significant numbness left greater than right. She's not experienced any falls. Her psoriasis is better. She denies any vaginal bleeding. She has started smoking again about a month ago and is smoking about a pack per day. She has lost approximately 40 pounds since we saw her in February. She reports that this is secondary to not feeling well and the multiple surgeries rather than any active attempts to lose weight.  Review of Systems  Constitutional: Feels tired with no energy during the day. Denies fever. Skin: No rash, sores, jaundice, itching, or dryness.  Cardiovascular: No chest pain, shortness of breath, or edema  Pulmonary: No cough or wheeze.  Gastro Intestinal:Hernia not changed. No nausea, vomiting, constipation, or  diarrhea reported. No bright red blood per rectum or change in bowel movement.  Genitourinary: No frequency, urgency, or dysuria.  Denies vaginal bleeding and discharge.  Musculoskeletal: As above Neurologic: No weakness, numbness, or change in gait.  Psychology: Frustrated by her issues with her back.  Current Meds:  Outpatient Encounter Prescriptions as of 11/03/2013  Medication Sig  . ALPRAZolam (XANAX) 0.5 MG tablet Take 0.5 mg by mouth at bedtime as needed.    Marland Kitchen aspirin 81 MG tablet Take 81 mg by mouth daily.    Marland Kitchen atorvastatin (LIPITOR) 10 MG tablet Take 10 mg by mouth daily.  . betamethasone dipropionate (DIPROLENE) 0.05 % ointment Apply topically 2 (two) times daily.  . chlorthalidone (HYGROTON) 25 MG tablet Take 25 mg by mouth every morning.    . Cholecalciferol (VITAMIN D3) 1000 UNITS CAPS Take 1 Units by mouth daily.    . Cyanocobalamin (VITAMIN B-12 CR) 1500 MCG TBCR Take by mouth daily.  Marland Kitchen escitalopram (LEXAPRO) 20 MG tablet Take 20 mg by mouth daily.  Marland Kitchen esomeprazole (NEXIUM) 40 MG capsule Take 40 mg by mouth 2 (two) times daily.   . fish oil-omega-3 fatty acids 1000 MG capsule Take 1 g by mouth daily.    . Fluticasone-Salmeterol (ADVAIR) 250-50 MCG/DOSE AEPB Inhale 1 puff into the lungs every 12 (twelve) hours.    . gabapentin (NEURONTIN) 600 MG tablet Take 600 mg  by mouth 2 (two) times daily.    Marland Kitchen glucose blood (ACCU-CHEK AVIVA PLUS) test strip 1 each by Other route as needed. Use as instructed   . Ibuprofen-Diphenhydramine Cit (ADVIL PM PO) Take 2 tablets by mouth as needed.   . insulin glargine (LANTUS) 100 UNIT/ML injection Inject 100 Units into the skin daily.   Marland Kitchen losartan (COZAAR) 100 MG tablet Take 100 mg by mouth daily.    . metFORMIN (GLUMETZA) 1000 MG (MOD) 24 hr tablet Take 1,000 mg by mouth 2 (two) times daily with a meal.    . methotrexate (RHEUMATREX) 2.5 MG tablet Take 15 mg by mouth once a week. Caution:Chemotherapy. Protect from light.  . metoprolol (TOPROL-XL)  100 MG 24 hr tablet Take 100 mg by mouth daily after breakfast.    . Multiple Vitamin (MULTIVITAMIN) tablet Take 1 tablet by mouth daily.    . nitroGLYCERIN (NITROSTAT) 0.4 MG SL tablet Place 1 tablet (0.4 mg total) under the tongue every 5 (five) minutes as needed for chest pain.  . nortriptyline (PAMELOR) 10 MG capsule Take 10 mg by mouth at bedtime.  . potassium chloride (KLOR-CON) 10 MEQ CR tablet Take 10 mEq by mouth daily.    . psyllium (REGULOID) 0.52 G capsule Take 0.52 g by mouth daily.    . sitaGLIPtin (JANUVIA) 100 MG tablet Take 100 mg by mouth daily.  . VENTOLIN HFA 108 (90 BASE) MCG/ACT inhaler Inhale 2 puffs into the lungs 4 times daily.  . clobetasol (TEMOVATE) 0.05 % cream Apply topically 2 (two) times daily as needed.    . fluocinonide cream (LIDEX) 0.05 % Apply topically 2 (two) times daily.  Marland Kitchen ibuprofen (ADVIL,MOTRIN) 200 MG tablet Take 200 mg by mouth 2 (two) times daily as needed.    Marland Kitchen oxyCODONE-acetaminophen (PERCOCET) 7.5-325 MG per tablet Take 1 tablet by mouth 4 (four) times daily as needed.    . Varenicline Tartrate (CHANTIX PO) Take 1 tablet by mouth 2 (two) times daily.    Allergy:  Allergies  Allergen Reactions  . Sulfa Antibiotics     Unsure of reaction    Social Hx:   History   Social History  . Marital Status: Married    Spouse Name: N/A    Number of Children: N/A  . Years of Education: N/A   Occupational History  . Not on file.   Social History Main Topics  . Smoking status: Former Smoker -- 1.00 packs/day for 40 years    Types: Cigarettes    Quit date: 03/15/2013  . Smokeless tobacco: Never Used  . Alcohol Use: No  . Drug Use: No  . Sexual Activity: No   Other Topics Concern  . Not on file   Social History Narrative  . No narrative on file    Past Surgical Hx:  Past Surgical History  Procedure Laterality Date  . Abdominal hysterectomy    . Cholecystectomy      biliteral  . Cataract extraction      2013  . Eye surgery Right  Aug. 2013    Cataract  . Eye surgery Left Sept. 2013    Cataract  . Back surgery  Aug/sept 2014    2 back surgeries  (Aug 2014 & Sept 2014)    Past Medical Hx:  Past Medical History  Diagnosis Date  . Diabetes mellitus   . Hypertension   . COPD (chronic obstructive pulmonary disease)   . Depression   . Osteoarthritis   . Ventral hernia  postoperative since 2009  . Peripheral neuropathy     following chemotherapy  . Carotid artery occlusion   . Endometrial ca     endometrial ca dx 4/11  . 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 Radiation 05/01/2011-06/11/11    5600 cGy 28 fxs/periaortic    Oncology Hx:    Malignant neoplasm of corpus uteri, except isthmus   05/18/2008 Initial Diagnosis Malignant neoplasm of corpus uteri, except isthmus, TAH/BSO IIIC2    - 10/03/2009 Chemotherapy Completed sandwich chemotherapy and radiation   03/04/2011 Relapse/Recurrence + PA node recurrence    Radiation Therapy radiation to nodal recurrence    Family Hx:  Family History  Problem Relation Age of Onset  . Stroke Mother   . Heart attack Father   . Heart disease Father     Heart Disease before age 24  . Hyperlipidemia Father   . Hypertension Father     Vitals:  Blood pressure 112/64, pulse 80, temperature 98.3 F (36.8 C), temperature source Oral, resp. rate 16, height 5' 5.32" (1.659 m), weight 208 lb 12.8 oz (94.711 kg).  Physical Exam: Well-nourished well-developed female   Neck: Supple no lymphadenopathy no thyromegaly.   Lungs: Clear to auscultation.   Cardiovascular: Regular rate and rhythm.   Back: Well-healed surgical incision in the lower back. No surrounding erythema or fluctuance.  Abdomen: Large hernia measuring about a 25 x 25 cm to the right side of the incision. Abdomen is soft and nontender. There is no obvious masses but exam is limited by her habitus as well as the large hernia.   Groins: No lymphadenopathy.   Extremities: No  edema scattered psoriasis which is markedly improved.  Pelvic: Normal external female genitalia. The vagina is markedly atrophic. The vaginal cuff is no visible lesions. Thin prep Pap smear was admitted without difficulty. Bimanual examination reveals no masses or nodularity. Rectal confirms. Exam is limited by habitus and the large hernia.  Assessment/Plan:  62 year old with history of stage IIIc endometrial carcinoma diagnosed in 2009 the periaortic recurrence in 2011 treated with radiation therapy.  Plan:  1) We will followup on the results of your Pap smear from today. We'll notify her of the results.  2) She will followup with Dr. Roselind Messier in 4 months. She will return to see [place] in 8 months. We have scheduled a CT scan in 4/15.        Raylee Strehl A., MD 11/03/2013, 2:57 PM

## 2013-11-07 ENCOUNTER — Telehealth: Payer: Self-pay | Admitting: Gynecologic Oncology

## 2013-11-07 NOTE — Telephone Encounter (Signed)
Message left for patient with pap smear results: negative.  Instructed to call for any questions or concerns.  

## 2013-11-17 ENCOUNTER — Other Ambulatory Visit: Payer: Self-pay | Admitting: Orthopaedic Surgery

## 2013-11-17 DIAGNOSIS — T8140XA Infection following a procedure, unspecified, initial encounter: Secondary | ICD-10-CM

## 2013-11-28 ENCOUNTER — Ambulatory Visit: Payer: Medicare Other | Admitting: Radiation Oncology

## 2013-12-29 DIAGNOSIS — I639 Cerebral infarction, unspecified: Secondary | ICD-10-CM

## 2013-12-29 DIAGNOSIS — M109 Gout, unspecified: Secondary | ICD-10-CM

## 2013-12-29 DIAGNOSIS — G459 Transient cerebral ischemic attack, unspecified: Secondary | ICD-10-CM

## 2013-12-29 HISTORY — DX: Transient cerebral ischemic attack, unspecified: G45.9

## 2013-12-29 HISTORY — DX: Cerebral infarction, unspecified: I63.9

## 2013-12-29 HISTORY — DX: Gout, unspecified: M10.9

## 2014-01-05 HISTORY — PX: OTHER SURGICAL HISTORY: SHX169

## 2014-03-14 ENCOUNTER — Other Ambulatory Visit: Payer: Self-pay | Admitting: Family Medicine

## 2014-03-14 ENCOUNTER — Ambulatory Visit
Admission: RE | Admit: 2014-03-14 | Discharge: 2014-03-14 | Disposition: A | Discharge status: Home / Self Care | Payer: Medicare Other | Source: Ambulatory Visit | Attending: Family Medicine | Admitting: Family Medicine

## 2014-03-14 DIAGNOSIS — M25562 Pain in left knee: Secondary | ICD-10-CM

## 2014-03-27 LAB — IFOBT (OCCULT BLOOD): IMMUNOLOGICAL FECAL OCCULT BLOOD TEST: NEGATIVE

## 2014-03-28 ENCOUNTER — Telehealth: Payer: Self-pay | Admitting: *Deleted

## 2014-03-28 NOTE — Telephone Encounter (Signed)
Fax received from [facility] in [place]- pt needs BUN and Cr. Prior to CT on 4/6. Called pt lmovm to call our offie to schedule lab appt.

## 2014-03-29 ENCOUNTER — Telehealth: Payer: Self-pay | Admitting: *Deleted

## 2014-03-29 ENCOUNTER — Other Ambulatory Visit: Payer: Self-pay | Admitting: *Deleted

## 2014-03-29 DIAGNOSIS — C549 Malignant neoplasm of corpus uteri, unspecified: Secondary | ICD-10-CM

## 2014-03-29 NOTE — Telephone Encounter (Signed)
Called pt regarding her CT scan and lab appt. Pt states "I'm not going to be able to have the CT scan, my back is in too much pain, I wont be able to last. I dont want to take anything that will knock me out just to get the exam either. Pt requested CT Scan be cancelled. Pt then transferred to Dr. Lois Huxley Nurse per pt request. Pt to f/u with Dr. Alycia Rossetti in July, instructed pt to call back to make appt & r/s her CT scanin may. Pt feels this will be enough time to get back pain under control. No further concerns.

## 2014-03-30 ENCOUNTER — Telehealth: Payer: Self-pay | Admitting: *Deleted

## 2014-03-30 ENCOUNTER — Ambulatory Visit: Payer: Medicare Other | Admitting: Radiation Oncology

## 2014-03-31 ENCOUNTER — Other Ambulatory Visit: Payer: Medicare Other

## 2014-04-03 ENCOUNTER — Ambulatory Visit (HOSPITAL_COMMUNITY)
Admission: RE | Admit: 2014-04-03 | Discharge: 2014-04-03 | Disposition: A | Discharge status: Home / Self Care | Payer: Medicare Other | Source: Ambulatory Visit | Attending: Gynecologic Oncology | Admitting: Gynecologic Oncology

## 2014-04-03 NOTE — Telephone Encounter (Signed)
ERROR

## 2014-04-06 ENCOUNTER — Ambulatory Visit: Payer: Medicare Other | Admitting: Radiation Oncology

## 2014-06-12 ENCOUNTER — Other Ambulatory Visit: Payer: Self-pay

## 2014-06-12 DIAGNOSIS — Z1231 Encounter for screening mammogram for malignant neoplasm of breast: Secondary | ICD-10-CM

## 2014-06-20 ENCOUNTER — Telehealth: Payer: Self-pay | Admitting: *Deleted

## 2014-06-20 DIAGNOSIS — C541 Malignant neoplasm of endometrium: Secondary | ICD-10-CM

## 2014-06-20 NOTE — Telephone Encounter (Signed)
Pt called regarding f/u appt and scan. CT r/s for7/28 at 915.  Called, no answer. Lmovm instructed pt to come by office to pick up contrast

## 2014-06-27 ENCOUNTER — Ambulatory Visit: Payer: Medicare Other

## 2014-07-14 ENCOUNTER — Ambulatory Visit
Admission: RE | Admit: 2014-07-14 | Discharge: 2014-07-14 | Disposition: A | Discharge status: Home / Self Care | Payer: Medicare Other | Source: Ambulatory Visit | Attending: Family Medicine | Admitting: Family Medicine

## 2014-07-14 ENCOUNTER — Other Ambulatory Visit: Payer: Self-pay | Admitting: Family Medicine

## 2014-07-14 DIAGNOSIS — R609 Edema, unspecified: Secondary | ICD-10-CM

## 2014-07-14 DIAGNOSIS — R52 Pain, unspecified: Secondary | ICD-10-CM

## 2014-07-18 ENCOUNTER — Other Ambulatory Visit: Payer: Medicare Other

## 2014-07-18 DIAGNOSIS — C549 Malignant neoplasm of corpus uteri, unspecified: Secondary | ICD-10-CM

## 2014-07-18 LAB — BUN AND CREATININE (CC13)
BUN: 14.7 mg/dL (ref 7.0–26.0)
Creatinine: 1 mg/dL (ref 0.6–1.1)

## 2014-07-25 ENCOUNTER — Encounter (HOSPITAL_COMMUNITY): Payer: Self-pay

## 2014-07-25 ENCOUNTER — Ambulatory Visit (HOSPITAL_COMMUNITY)
Admission: RE | Admit: 2014-07-25 | Discharge: 2014-07-25 | Disposition: A | Discharge status: Home / Self Care | Payer: Medicare Other | Source: Ambulatory Visit | Attending: Gynecologic Oncology | Admitting: Gynecologic Oncology

## 2014-07-25 DIAGNOSIS — R911 Solitary pulmonary nodule: Secondary | ICD-10-CM | POA: Diagnosis not present

## 2014-07-25 DIAGNOSIS — C549 Malignant neoplasm of corpus uteri, unspecified: Secondary | ICD-10-CM | POA: Insufficient documentation

## 2014-07-25 DIAGNOSIS — M47814 Spondylosis without myelopathy or radiculopathy, thoracic region: Secondary | ICD-10-CM | POA: Diagnosis not present

## 2014-07-25 DIAGNOSIS — I2510 Atherosclerotic heart disease of native coronary artery without angina pectoris: Secondary | ICD-10-CM | POA: Diagnosis not present

## 2014-07-25 DIAGNOSIS — J984 Other disorders of lung: Secondary | ICD-10-CM | POA: Diagnosis not present

## 2014-07-25 DIAGNOSIS — I700 Atherosclerosis of aorta: Secondary | ICD-10-CM | POA: Diagnosis not present

## 2014-07-25 DIAGNOSIS — Z9089 Acquired absence of other organs: Secondary | ICD-10-CM | POA: Diagnosis not present

## 2014-07-25 DIAGNOSIS — J438 Other emphysema: Secondary | ICD-10-CM | POA: Insufficient documentation

## 2014-07-25 DIAGNOSIS — M47817 Spondylosis without myelopathy or radiculopathy, lumbosacral region: Secondary | ICD-10-CM | POA: Insufficient documentation

## 2014-07-25 DIAGNOSIS — Z90710 Acquired absence of both cervix and uterus: Secondary | ICD-10-CM | POA: Diagnosis not present

## 2014-07-25 DIAGNOSIS — C541 Malignant neoplasm of endometrium: Secondary | ICD-10-CM

## 2014-07-25 MED ORDER — IOHEXOL 300 MG/ML  SOLN
100.0000 mL | Freq: Once | INTRAMUSCULAR | Status: AC | PRN
  Administered 2014-07-25: 100 mL via INTRAVENOUS

## 2014-07-27 ENCOUNTER — Encounter: Payer: Self-pay | Admitting: Family

## 2014-07-28 ENCOUNTER — Ambulatory Visit (HOSPITAL_COMMUNITY)
Admission: RE | Admit: 2014-07-28 | Discharge: 2014-07-28 | Disposition: A | Discharge status: Home / Self Care | Payer: Medicare Other | Source: Ambulatory Visit | Attending: Family | Admitting: Family

## 2014-07-28 ENCOUNTER — Encounter: Payer: Self-pay | Admitting: Family

## 2014-07-28 ENCOUNTER — Ambulatory Visit (INDEPENDENT_AMBULATORY_CARE_PROVIDER_SITE_OTHER): Payer: Medicare Other | Admitting: Family

## 2014-07-28 VITALS — BP 127/69 | HR 59 | Resp 16 | Ht 66.0 in | Wt 181.0 lb

## 2014-07-28 DIAGNOSIS — I6529 Occlusion and stenosis of unspecified carotid artery: Secondary | ICD-10-CM

## 2014-07-28 NOTE — Progress Notes (Signed)
Established Carotid Patient   History of Present Illness  Lauren [name] is a 63 y.o. female patient of Dr. Kellie Simmering who returns today for follow up of known carotid stenosis.  Patient has not had previous carotid artery intervention.  Patient has Negative history of TIA or stroke symptom.  The patient denies amaurosis fugax or monocular blindness.  The patient  denies facial drooping.  Pt. denies hemiplegia.  The patient denies receptive or expressive aphasia.    She denies non healing wounds in feet/legs.   reports New Medical or Surgical History: needs abdominal hernia repair and spine surgery but states neither will be done unless she stops smoking She has had 3 lumbar spine surgeries. She has lost about 70 pounds with effort in 1-2 years. She knows she is wheezing now.  Pt Diabetic: Yes, states her last A1C was 6.? Pt smoker: smoker  (1.5 ppd, started smoking at age 23 yrs)  Pt meds include: Statin : Yes ASA: Yes Other anticoagulants/antiplatelets: no   Past Medical History  Diagnosis Date  . Diabetes mellitus   . Hypertension   . COPD (chronic obstructive pulmonary disease)   . Depression   . Osteoarthritis   . Ventral hernia     postoperative since 2009  . Peripheral neuropathy     following chemotherapy  . Carotid artery occlusion   . Endometrial ca     endometrial ca dx 4/11  . 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 Radiation 05/01/2011-06/11/11    5600 cGy 28 fxs/periaortic  . Anemia     Social History History  Substance Use Topics  . Smoking status: Former Smoker -- 1.00 packs/day for 40 years    Types: Cigarettes    Quit date: 03/15/2013  . Smokeless tobacco: Never Used  . Alcohol Use: No    Family History Family History  Problem Relation Age of Onset  . Stroke Mother   . Heart attack Father   . Heart disease Father     Heart Disease before age 48  . Hyperlipidemia Father   . Hypertension Father   . Diabetes  Daughter     Surgical History Past Surgical History  Procedure Laterality Date  . Cholecystectomy      biliteral  . Cataract extraction      2013  . Eye surgery Right Aug. 2013    Cataract  . Eye surgery Left Sept. 2013    Cataract  . Back surgery  Aug/sept 2014    2 back surgeries  (Aug 2014 & Sept 2014)  . Spine surgery  Jan. 8, 2015    Back  . Abdominal hysterectomy  2009    Allergies  Allergen Reactions  . Sulfa Antibiotics Other (See Comments)    Unsure of reaction    Current Outpatient Prescriptions  Medication Sig Dispense Refill  . ALPRAZolam (XANAX) 0.5 MG tablet Take 0.5 mg by mouth at bedtime as needed.        Marland Kitchen aspirin 81 MG tablet Take 81 mg by mouth daily.        Marland Kitchen atorvastatin (LIPITOR) 10 MG tablet Take 10 mg by mouth daily.      . betamethasone dipropionate (DIPROLENE) 0.05 % ointment Apply topically 2 (two) times daily.      . chlorthalidone (HYGROTON) 25 MG tablet Take 25 mg by mouth every morning.        . Cholecalciferol (VITAMIN D3) 1000 UNITS CAPS Take 1 Units by  mouth daily.        . Cyanocobalamin (VITAMIN B-12 CR) 1500 MCG TBCR Take by mouth daily.      Marland Kitchen escitalopram (LEXAPRO) 20 MG tablet Take 20 mg by mouth daily.      Marland Kitchen esomeprazole (NEXIUM) 40 MG capsule Take 40 mg by mouth 2 (two) times daily.       . fish oil-omega-3 fatty acids 1000 MG capsule Take 1 g by mouth daily.        . Fluticasone-Salmeterol (ADVAIR) 250-50 MCG/DOSE AEPB Inhale 1 puff into the lungs every 12 (twelve) hours.        . folic acid (FOLVITE) 1 MG tablet Take 1 mg by mouth daily.      Marland Kitchen gabapentin (NEURONTIN) 600 MG tablet Take 600 mg by mouth 2 (two) times daily. Pt may take up to 6 Tabs if needed per day.      Marland Kitchen glucose blood (ACCU-CHEK AVIVA PLUS) test strip 1 each by Other route as needed. Use as instructed       . insulin glargine (LANTUS) 100 UNIT/ML injection Inject 35 Units into the skin daily.       Marland Kitchen losartan (COZAAR) 100 MG tablet Take 100 mg by mouth daily.         . metFORMIN (GLUMETZA) 1000 MG (MOD) 24 hr tablet Take 1,000 mg by mouth 2 (two) times daily with a meal.        . methocarbamol (ROBAXIN) 750 MG tablet Take 750 mg by mouth daily.      . methotrexate (RHEUMATREX) 2.5 MG tablet Take 15 mg by mouth once a week. Caution:Chemotherapy. Protect from light.      . metoprolol (TOPROL-XL) 100 MG 24 hr tablet Take 100 mg by mouth daily after breakfast.        . Multiple Vitamin (MULTIVITAMIN) tablet Take 1 tablet by mouth daily.        . nitroGLYCERIN (NITROSTAT) 0.4 MG SL tablet Place 1 tablet (0.4 mg total) under the tongue every 5 (five) minutes as needed for chest pain.  25 tablet  3  . nortriptyline (PAMELOR) 10 MG capsule Take 10 mg by mouth at bedtime.      . OxyCODONE (OXYCONTIN) 40 mg T12A 12 hr tablet Take 40 mg by mouth every 12 (twelve) hours.      Marland Kitchen oxyCODONE-acetaminophen (PERCOCET) 7.5-325 MG per tablet Take 1 tablet by mouth 4 (four) times daily as needed. 10-325 mg      . potassium chloride (KLOR-CON) 10 MEQ CR tablet Take 10 mEq by mouth daily.        . psyllium (REGULOID) 0.52 G capsule Take 0.52 g by mouth daily.        Marland Kitchen saccharomyces boulardii (FLORASTOR) 250 MG capsule Take 250 mg by mouth daily. PROBIOTIC      . VENTOLIN HFA 108 (90 BASE) MCG/ACT inhaler Inhale 2 puffs into the lungs 4 times daily.      . ciprofloxacin (CIPRO) 500 MG tablet Take 500 mg by mouth 2 (two) times daily.      . clobetasol (TEMOVATE) 0.05 % cream Apply topically 2 (two) times daily as needed.        . doxycycline (VIBRAMYCIN) 100 MG capsule Take 100 mg by mouth 2 (two) times daily.      . fluocinonide cream (LIDEX) 0.05 % Apply topically 2 (two) times daily.      Marland Kitchen ibuprofen (ADVIL,MOTRIN) 200 MG tablet Take 200 mg by mouth 2 (two) times  daily as needed.        . Ibuprofen-Diphenhydramine Cit (ADVIL PM PO) Take 2 tablets by mouth as needed.       . sitaGLIPtin (JANUVIA) 100 MG tablet Take 100 mg by mouth daily.      . Varenicline Tartrate (CHANTIX  PO) Take 1 tablet by mouth 2 (two) times daily.       No current facility-administered medications for this visit.    Review of Systems : See HPI for pertinent positives and negatives.  Physical Examination   Filed Vitals:   07/28/14 1212 07/28/14 1215  BP: 129/71 127/69  Pulse: 66 59  Resp:  16  Height:  5\' 6"  (1.676 m)  Weight:  181 lb (82.101 kg)  SpO2:  92%   Body mass index is 29.23 kg/(m^2).   General: WDWN obese female in NAD GAIT: normal Eyes: PERRLA Pulmonary:  Non-labored, CTAB, Negative  Rales, Negative rhonchi, & Positive wheezing.  Cardiac: regular Rhythm ,  Negative detected murmur.  VASCULAR EXAM Carotid Bruits Right Left   Negative Negative    Radial pulses are 2+ palpable and equal.                                                                                                                       Gastrointestinal: soft, nontender, BS WNL, no r/g, lower abdomen hernia.  Musculoskeletal: Negative muscle atrophy/wasting. M/S 5/5 in upper extremities, 4/5 in lower extremities, Extremities without ischemic changes.  Neurologic: A&O X 3; Appropriate Affect ; SENSATION ;normal;  Speech is normal CN 2-12 intact, Pain and light touch intact in extremities, Motor exam as listed above.   Non-Invasive Vascular Imaging CAROTID DUPLEX 07/28/2014   CEREBROVASCULAR DUPLEX EVALUATION    INDICATION: Carotid stenosis    PREVIOUS INTERVENTION(S): N/A    DUPLEX EXAM:     RIGHT  LEFT  Peak Systolic Velocities (cm/s) End Diastolic Velocities (cm/s) Plaque LOCATION Peak Systolic Velocities (cm/s) End Diastolic Velocities (cm/s) Plaque  77 12  CCA PROXIMAL 100 26   61 15  CCA MID 92 27   66 18 HT CCA DISTAL 87 26 HT  310 30 CP ECA 108 17 HT  299 108 CP ICA PROXIMAL 107 29 HT  237 57  ICA MID 104 40   95 39  ICA DISTAL 130 39     4.90 ICA / CCA Ratio (PSV) 1.16  Antegrade Vertebral Flow Antegrade  233 Brachial Systolic Pressure (mmHg) 007  Triphasic  Brachial Artery Waveforms Triphasic    Plaque Morphology:  HM = Homogeneous, HT = Heterogeneous, CP = Calcific Plaque, SP = Smooth Plaque, IP = Irregular Plaque     ADDITIONAL FINDINGS:     IMPRESSION: Right internal carotid artery stenosis present in the 60%-79%, which may be underestimated due to calcific plaque making Doppler interrogation difficult. End diastolic flow and ratio is significantly more elevated. Left internal carotid artery stenosis present of less than 40%.    Compared to the previous exam:  Increase in end diastole and ratio of right internal carotid artery and stable on the left since previous study on 03/03/2013.      Assessment: Lauren [name] is a 63 y.o. female who presents with asymptomatic right internal carotid artery stenosis present in the 60%-79%, which may be underestimated due to calcific plaque making Doppler interrogation difficult. End diastolic flow and ratio is significantly more elevated. Left internal carotid artery stenosis present of less than 40%.  She just had radiation exposure from a CT of her spine this week. Follow up with Dr. Kellie Simmering this Tuesday, August 4, to discuss her options re addressing the right ICA stenosis.   Plan:  Based on today's exam and Duplex results, and after discussing with Dr. Bridgett Larsson, since she just had radiation exposure from a CT of her spine this week, will not schedule CTA of neck at this time, follow up with Dr. Kellie Simmering next week, August 4.  She was counseled re smoking cessation. I discussed in depth with the patient the nature of atherosclerosis, and emphasized the importance of maximal medical management including strict control of blood pressure, blood glucose, and lipid levels, obtaining regular exercise, and cessation of smoking.  The patient is aware that without maximal medical management the underlying atherosclerotic disease process will progress, limiting the benefit of any interventions. The patient was  given information about stroke prevention and what symptoms should prompt the patient to seek immediate medical care. Thank you for allowing [place] to participate in this patient's care.  Clemon Chambers, RN, MSN, FNP-C [facility] Office: [phone]  Clinic Physician: Bridgett Larsson  07/28/2014 12:15 PM

## 2014-07-28 NOTE — Patient Instructions (Addendum)
Stroke Prevention Some medical conditions and behaviors are associated with an increased chance of having a stroke. You may prevent a stroke by making healthy choices and managing medical conditions. HOW CAN I REDUCE MY RISK OF HAVING A STROKE?   Stay physically active. Get at least 30 minutes of activity on most or all days.  Do not smoke. It may also be helpful to avoid exposure to secondhand smoke.  Limit alcohol use. Moderate alcohol use is considered to be:  No more than 2 drinks per day for men.  No more than 1 drink per day for nonpregnant women.  Eat healthy foods. This involves:  Eating 5 or more servings of fruits and vegetables a day.  Making dietary changes that address high blood pressure (hypertension), high cholesterol, diabetes, or obesity.  Manage your cholesterol levels.  Making food choices that are high in fiber and low in saturated fat, trans fat, and cholesterol may control cholesterol levels.  Take any prescribed medicines to control cholesterol as directed by your health care provider.  Manage your diabetes.  Controlling your carbohydrate and sugar intake is recommended to manage diabetes.  Take any prescribed medicines to control diabetes as directed by your health care provider.  Control your hypertension.  Making food choices that are low in salt (sodium), saturated fat, trans fat, and cholesterol is recommended to manage hypertension.  Take any prescribed medicines to control hypertension as directed by your health care provider.  Maintain a healthy weight.  Reducing calorie intake and making food choices that are low in sodium, saturated fat, trans fat, and cholesterol are recommended to manage weight.  Stop drug abuse.  Avoid taking birth control pills.  Talk to your health care provider about the risks of taking birth control pills if you are over 35 years old, smoke, get migraines, or have ever had a blood clot.  Get evaluated for sleep  disorders (sleep apnea).  Talk to your health care provider about getting a sleep evaluation if you snore a lot or have excessive sleepiness.  Take medicines only as directed by your health care provider.  For some people, aspirin or blood thinners (anticoagulants) are helpful in reducing the risk of forming abnormal blood clots that can lead to stroke. If you have the irregular heart rhythm of atrial fibrillation, you should be on a blood thinner unless there is a good reason you cannot take them.  Understand all your medicine instructions.  Make sure that other conditions (such as anemia or atherosclerosis) are addressed. SEEK IMMEDIATE MEDICAL CARE IF:   You have sudden weakness or numbness of the face, arm, or leg, especially on one side of the body.  Your face or eyelid droops to one side.  You have sudden confusion.  You have trouble speaking (aphasia) or understanding.  You have sudden trouble seeing in one or both eyes.  You have sudden trouble walking.  You have dizziness.  You have a loss of balance or coordination.  You have a sudden, severe headache with no known cause.  You have new chest pain or an irregular heartbeat. Any of these symptoms may represent a serious problem that is an emergency. Do not wait to see if the symptoms will go away. Get medical help at once. Call your local emergency services (911 in [place] Do not drive yourself to the hospital. Document Released: 01/22/2005 Document Revised: 05/01/2014 Document Reviewed: 06/17/2013 Exit[employer] Patient Information 2015 [employer]. This information is not intended to replace advice given   to you by your health care provider. Make sure you discuss any questions you have with your health care provider.   Smoking Cessation Quitting smoking is important to your health and has many advantages. However, it is not always easy to quit since nicotine is a very addictive drug. Oftentimes, people try 3 times or more  before being able to quit. This document explains the best ways for you to prepare to quit smoking. Quitting takes hard work and a lot of effort, but you can do it. ADVANTAGES OF QUITTING SMOKING  You will live longer, feel better, and live better.  Your body will feel the impact of quitting smoking almost immediately.  Within 20 minutes, blood pressure decreases. Your pulse returns to its normal level.  After 8 hours, carbon monoxide levels in the blood return to normal. Your oxygen level increases.  After 24 hours, the chance of having a heart attack starts to decrease. Your breath, hair, and body stop smelling like smoke.  After 48 hours, damaged nerve endings begin to recover. Your sense of taste and smell improve.  After 72 hours, the body is virtually free of nicotine. Your bronchial tubes relax and breathing becomes easier.  After 2 to 12 weeks, lungs can hold more air. Exercise becomes easier and circulation improves.  The risk of having a heart attack, stroke, cancer, or lung disease is greatly reduced.  After 1 year, the risk of coronary heart disease is cut in half.  After 5 years, the risk of stroke falls to the same as a nonsmoker.  After 10 years, the risk of lung cancer is cut in half and the risk of other cancers decreases significantly.  After 15 years, the risk of coronary heart disease drops, usually to the level of a nonsmoker.  If you are pregnant, quitting smoking will improve your chances of having a healthy baby.  The people you live with, especially any children, will be healthier.  You will have extra money to spend on things other than cigarettes. QUESTIONS TO THINK ABOUT BEFORE ATTEMPTING TO QUIT You may want to talk about your answers with your health care provider.  Why do you want to quit?  If you tried to quit in the past, what helped and what did not?  What will be the most difficult situations for you after you quit? How will you plan to  handle them?  Who can help you through the tough times? Your family? Friends? A health care provider?  What pleasures do you get from smoking? What ways can you still get pleasure if you quit? Here are some questions to ask your health care provider:  How can you help me to be successful at quitting?  What medicine do you think would be best for me and how should I take it?  What should I do if I need more help?  What is smoking withdrawal like? How can I get information on withdrawal? GET READY  Set a quit date.  Change your environment by getting rid of all cigarettes, ashtrays, matches, and lighters in your home, car, or work. Do not let people smoke in your home.  Review your past attempts to quit. Think about what worked and what did not. GET SUPPORT AND ENCOURAGEMENT You have a better chance of being successful if you have help. You can get support in many ways.  Tell your family, friends, and coworkers that you are going to quit and need their support. Ask them not   to smoke around you.  Get individual, group, or telephone counseling and support. Programs are available at local hospitals and health centers. Call your local health department for information about programs in your area.  Spiritual beliefs and practices may help some smokers quit.  Download a "quit meter" on your computer to keep track of quit statistics, such as how long you have gone without smoking, cigarettes not smoked, and money saved.  Get a self-help book about quitting smoking and staying off tobacco. LEARN NEW SKILLS AND BEHAVIORS  Distract yourself from urges to smoke. Talk to someone, go for a walk, or occupy your time with a task.  Change your normal routine. Take a different route to work. Drink tea instead of coffee. Eat breakfast in a different place.  Reduce your stress. Take a hot bath, exercise, or read a book.  Plan something enjoyable to do every day. Reward yourself for not  smoking.  Explore interactive web-based programs that specialize in helping you quit. GET MEDICINE AND USE IT CORRECTLY Medicines can help you stop smoking and decrease the urge to smoke. Combining medicine with the above behavioral methods and support can greatly increase your chances of successfully quitting smoking.  Nicotine replacement therapy helps deliver nicotine to your body without the negative effects and risks of smoking. Nicotine replacement therapy includes nicotine gum, lozenges, inhalers, nasal sprays, and skin patches. Some may be available over-the-counter and others require a prescription.  Antidepressant medicine helps people abstain from smoking, but how this works is unknown. This medicine is available by prescription.  Nicotinic receptor partial agonist medicine simulates the effect of nicotine in your brain. This medicine is available by prescription. Ask your health care provider for advice about which medicines to use and how to use them based on your health history. Your health care provider will tell you what side effects to look out for if you choose to be on a medicine or therapy. Carefully read the information on the package. Do not use any other product containing nicotine while using a nicotine replacement product.  RELAPSE OR DIFFICULT SITUATIONS Most relapses occur within the first 3 months after quitting. Do not be discouraged if you start smoking again. Remember, most people try several times before finally quitting. You may have symptoms of withdrawal because your body is used to nicotine. You may crave cigarettes, be irritable, feel very hungry, cough often, get headaches, or have difficulty concentrating. The withdrawal symptoms are only temporary. They are strongest when you first quit, but they will go away within 10-14 days. To reduce the chances of relapse, try to:  Avoid drinking alcohol. Drinking lowers your chances of successfully quitting.  Reduce the  amount of caffeine you consume. Once you quit smoking, the amount of caffeine in your body increases and can give you symptoms, such as a rapid heartbeat, sweating, and anxiety.  Avoid smokers because they can make you want to smoke.  Do not let weight gain distract you. Many smokers will gain weight when they quit, usually less than 10 pounds. Eat a healthy diet and stay active. You can always lose the weight gained after you quit.  Find ways to improve your mood other than smoking. FOR MORE INFORMATION  www.smokefree.gov  Document Released: 12/09/2001 Document Revised: 05/01/2014 Document Reviewed: 03/25/2012 [employer] Patient Information 2015 [employer]. This information is not intended to replace advice given to you by your health care provider. Make sure you discuss any questions you have with your health care   provider.  

## 2014-07-31 ENCOUNTER — Encounter: Payer: Self-pay | Admitting: Vascular Surgery

## 2014-08-01 ENCOUNTER — Ambulatory Visit (INDEPENDENT_AMBULATORY_CARE_PROVIDER_SITE_OTHER): Payer: Medicare Other | Admitting: Vascular Surgery

## 2014-08-01 ENCOUNTER — Encounter: Payer: Self-pay | Admitting: Vascular Surgery

## 2014-08-01 VITALS — BP 158/72 | HR 79 | Ht 66.0 in | Wt 182.4 lb

## 2014-08-01 DIAGNOSIS — I6529 Occlusion and stenosis of unspecified carotid artery: Secondary | ICD-10-CM

## 2014-08-01 NOTE — Progress Notes (Signed)
Subjective:     Patient ID: Lauren [name], female   DOB: Jun 12, 1951, 63 y.o.   MRN: [id number]  HPI this 63 year old female returns today to further discuss her carotid occlusive disease. She was seen in our office by Jetty Duhamel last week. Patient has an approximate 75-80% right ICA stenosis which is asymptomatic. She has a history of uterine cancer and has had chemotherapy therapy and radiation in the past as well as more recent radiation to her spine for recurrent disease. She had a CT scan last week and she does not know the results of that at the present time. She also has potential back surgery pending for Dr. MEQ:ASTMH. Patient denies any neurologic symptoms including lateralizing weakness, aphasia, amaurosis fugax, diplopia, blurred vision, or syncope.   Review of Systems     Objective:   Physical Exam BP 158/72  Pulse 79  Ht 5\' 6"  (1.676 m)  Wt 182 lb 6.4 oz (82.736 kg)  BMI 29.45 kg/m2  SpO2 97%  Gen. chronically ill-appearing female in no apparent distress alert and oriented x3 ambulates with a walker Right neck has harsh carotid bruit Neurologic exam unremarkable      Assessment:     I reviewed the previous carotid duplex exam done last week in our office. Velocities are approximately 300/108. This is a progression of disease from the previous study in March of 2014.    Plan:     I discussed the situation with the patient and her husband. I have recommended waiting 6 months to repeat a carotid duplex exam. If she requires a back surgery prior to that time we should probably proceed with right carotid endarterectomy. Her lesion is in the 75-80% range and is asymptomatic. I think she will need right carotid endarterectomy in the near future. If she should decide she would like to proceed with surgery now she will be back in touch with [place].

## 2014-08-01 NOTE — Addendum Note (Signed)
Addended by: Mena Goes on: 08/01/2014 01:50 PM   Modules accepted: Orders

## 2014-08-03 ENCOUNTER — Telehealth: Payer: Self-pay | Admitting: Gynecologic Oncology

## 2014-08-03 NOTE — Telephone Encounter (Signed)
LM for patient to call me at [facility] for CT scan results. PG

## 2014-08-04 ENCOUNTER — Telehealth: Payer: Self-pay | Admitting: *Deleted

## 2014-08-04 ENCOUNTER — Telehealth: Payer: Self-pay | Admitting: Gynecologic Oncology

## 2014-08-04 NOTE — Telephone Encounter (Signed)
Per MD's note called pt notified pt's husband Micheal no findings for recurrent or metastatic disease in the chest abdomen or pelvis. Husband verbalized understanding. No further concerns.

## 2014-08-04 NOTE — Telephone Encounter (Signed)
LM X2 on home and cell phone. Patient may call clinic for CT results. PG

## 2014-08-23 ENCOUNTER — Encounter: Payer: Self-pay | Admitting: Gynecologic Oncology

## 2014-08-23 ENCOUNTER — Ambulatory Visit: Payer: Medicare Other | Attending: Gynecologic Oncology | Admitting: Gynecologic Oncology

## 2014-08-23 ENCOUNTER — Other Ambulatory Visit (HOSPITAL_COMMUNITY)
Admission: RE | Admit: 2014-08-23 | Discharge: 2014-08-23 | Disposition: A | Discharge status: Home / Self Care | Payer: Medicare Other | Source: Ambulatory Visit | Attending: Gynecologic Oncology | Admitting: Gynecologic Oncology

## 2014-08-23 VITALS — BP 166/70 | HR 84 | Temp 98.7°F | Resp 21 | Ht 66.0 in | Wt 182.1 lb

## 2014-08-23 DIAGNOSIS — F3289 Other specified depressive episodes: Secondary | ICD-10-CM | POA: Insufficient documentation

## 2014-08-23 DIAGNOSIS — F329 Major depressive disorder, single episode, unspecified: Secondary | ICD-10-CM | POA: Insufficient documentation

## 2014-08-23 DIAGNOSIS — Z7982 Long term (current) use of aspirin: Secondary | ICD-10-CM | POA: Insufficient documentation

## 2014-08-23 DIAGNOSIS — C541 Malignant neoplasm of endometrium: Secondary | ICD-10-CM

## 2014-08-23 DIAGNOSIS — I700 Atherosclerosis of aorta: Secondary | ICD-10-CM | POA: Diagnosis not present

## 2014-08-23 DIAGNOSIS — J4489 Other specified chronic obstructive pulmonary disease: Secondary | ICD-10-CM | POA: Insufficient documentation

## 2014-08-23 DIAGNOSIS — Z882 Allergy status to sulfonamides status: Secondary | ICD-10-CM | POA: Diagnosis not present

## 2014-08-23 DIAGNOSIS — F17200 Nicotine dependence, unspecified, uncomplicated: Secondary | ICD-10-CM | POA: Diagnosis not present

## 2014-08-23 DIAGNOSIS — Z8542 Personal history of malignant neoplasm of other parts of uterus: Secondary | ICD-10-CM

## 2014-08-23 DIAGNOSIS — I10 Essential (primary) hypertension: Secondary | ICD-10-CM | POA: Diagnosis not present

## 2014-08-23 DIAGNOSIS — Z9221 Personal history of antineoplastic chemotherapy: Secondary | ICD-10-CM | POA: Diagnosis not present

## 2014-08-23 DIAGNOSIS — J449 Chronic obstructive pulmonary disease, unspecified: Secondary | ICD-10-CM | POA: Insufficient documentation

## 2014-08-23 DIAGNOSIS — Z79899 Other long term (current) drug therapy: Secondary | ICD-10-CM | POA: Insufficient documentation

## 2014-08-23 DIAGNOSIS — Z124 Encounter for screening for malignant neoplasm of cervix: Secondary | ICD-10-CM | POA: Diagnosis present

## 2014-08-23 DIAGNOSIS — Z923 Personal history of irradiation: Secondary | ICD-10-CM | POA: Insufficient documentation

## 2014-08-23 DIAGNOSIS — E119 Type 2 diabetes mellitus without complications: Secondary | ICD-10-CM | POA: Diagnosis not present

## 2014-08-23 DIAGNOSIS — D649 Anemia, unspecified: Secondary | ICD-10-CM | POA: Diagnosis not present

## 2014-08-23 DIAGNOSIS — M1990 Unspecified osteoarthritis, unspecified site: Secondary | ICD-10-CM | POA: Insufficient documentation

## 2014-08-23 NOTE — Progress Notes (Signed)
Consult Note: Gyn-Onc  Lauren [name] 63 y.o. female  CC:  Chief Complaint  Patient presents with  . Endometrial Cancer    HPI: Patient is a 63 year old with a history of stage IIIc endometrioid carcinoma who underwent exploratory laparotomy TAH/BSO and appropriate staging in May of 2009. Final pathology revealed a grade 1 lesion involving the outer half of the myometrium with lymphovascular space involvement. 2/7 lymph nodes were positive for metastatic cancer with extranodal extension and these 2 lymph nodes. She underwent chemotherapy and radiation under a "sandwich" protocol had a negative post-treatment CT scan in October 2010. She was seen by Dr. Sandi Mariscal for increased back pain in November of 2011. An MRI March 04, 2011 revealed a new 3.5 x 3 x 3.19 cm lesion in the left periorbital region extending from L3-L4 disc space. There was mass effect on aorta consistent with metastatic disease. She underwent radiation therapy under the care of Dr. Sondra Come. We saw the patient in 01/2012. She had a CT scan in April 2013. The lungs revealed some changes consistent with emphysema. There is a 1.1 cm thyroid nodule on the right side that was unchanged. She the best of coronary artery atherosclerosis. Within the abdomen and pelvis: She had hepatomegaly. The spleen, stomach, pancreas were normal. She had soft tissue thickening posterior and to the left of the aortic bifurcation measuring 9 mm versus 1.1 cm on her prior CT scan from October of 2012. There is no evidence of any new disease. She was recently seen by Dr. Sondra Come in 12/13 and I last saw her in August of 2013 at which time her exam was negative. She had a CT scan in October 2013 that revealed no evidence of recurrent or metastatic carcinoma or other acute findings. She had stable right-sided nephrolithiasis with no evidence of hydronephrosis.   I last saw her in November 2014. CT scan 7/15:  CT CHEST FINDINGS  4 mm subpleural nodule in the posterior  left lower lobe (series 4/image 26), new since 2013. This is considered unlikely to reflect a solitary pulmonary metastasis but is technically indeterminate. Scattered faint ground-glass opacities in the right upper lobe (series 4/image 18), left upper lobe (series 4/ image 32), and left lower lobe (series 4/ image 44), similar to 6789, of uncertain etiology but not suspicious for metastatic disease. Mild biapical pleural parenchymal scarring. Mild emphysematous changes. No pleural effusion or pneumothorax. Visualized thyroid is unremarkable. The heart is normal in size. No pericardial effusion. Coronary atherosclerosis. Atherosclerotic calcifications of the aortic arch. Small mediastinal lymph nodes measuring up to 7 mm (series 2/ image 22), within normal limits. No suspicious hilar or axillary lymphadenopathy.  Degenerative changes of the thoracic spine.  CT ABDOMEN AND PELVIS FINDINGS Liver is notable for a right else lobe configuration. No suspicious hepatic lesions. Spleen, pancreas, and adrenal glands are within normal limits. Status post cholecystectomy. Mild central intrahepatic prominence. Common duct measures 10 mm, likely postsurgical, and smoothly tapers at the ampulla. Kidneys are within normal limits. No hydronephrosis. No evidence of bowel obstruction. Large anterior abdominal wall defect containing multiple loops of small and large bowel. Normal appendix (series 2/image 89). Eccentric wall thickening involving in the cecum/ascending colon near the terminal ileum (series 2/images  81-84), indeterminate. Atherosclerotic calcifications of the abdominal aorta and branch vessels.  No abdominopelvic ascites.  No suspicious abdominopelvic lymphadenopathy.  Status post hysterectomy. No adnexal masses  Bladder is within normal limits.  Degenerative changes of the lumbar spine with postsurgical changes  at  L4-5 and L5-S1.  IMPRESSION:  Status post hysterectomy. No findings specific for recurrent or  metastatic disease in the chest, abdomen, or pelvis. 4 mm left lower lobe nodule, new from 2013 and technically  indeterminate, but considered unlikely to reflect a solitary pulmonary metastasis. Attention on follow-up is suggested. Additional scattered ground-glass opacities in the lungs bilaterally, unchanged from 4098, of uncertain etiology but considered unlikely to reflect metastatic disease. Follow-up CT chest is suggested in 1 year. Eccentric wall thickening involving the cecum/ascending colon, worrisome for possible underlying mass. Colonoscopy is suggested. Please note that this procedure may be technically complicated by a large abdominal wall hernia containing loops of small and large  bowel.   Interval History: Since we last saw T. states that there discussing the possibility of repeat back surgery in January. She also has an 85% occluded chin in her right carotid in to need surgery for that. She quit smoking in August of last year but then started again in is smoking again 1.5 packs per day. She has lost about 26 pounds since we last saw her. She stay she's not really trying to lose weight. She stay she primarily eats peach cobbler. The only thing that she has done is eat less meat. I discussed her CT findings with particular attention drawn to the changes around the cecum. I discussed with her that she needs to proceed with a colonoscopy. She states her primary physicians also been discussing this with her. I believe in the setting of the CT findings as well as her anemia when she was hospitalized it will be important. She now agrees to doing this.  Her biggest complaint continues to be her back pain. She's taking her pain medication with relief. She states it makes her not to very sharp mentally. She has any vaginal bleeding. She has a change about bladder habits. She denies any change in her stool caliber, melanotic appearance of bright red blood per rectum. She does have shortness of breath  with activity but occasionally at rest. She denies any fevers or chills.  Review of Systems  Constitutional: Feels tired with no energy during the day. Denies fever. Skin: No rash, sores, jaundice, itching, or dryness.  Cardiovascular: No chest pain, + shortness of breath, or edema  Pulmonary: No cough, +wheeze.  Gastro Intestinal:Hernia not changed. No nausea, vomiting, constipation, or diarrhea reported. No bright red blood per rectum or change in bowel movement.  Genitourinary: No frequency, urgency, or dysuria.  Denies vaginal bleeding and discharge.  Musculoskeletal: As above with + back pain Neurologic: No weakness, numbness, or change in gait. Using a walker. Psychology: Frustrated by her issues with her back.  Current Meds:  Outpatient Encounter Prescriptions as of 08/23/2014  Medication Sig  . ALPRAZolam (XANAX) 0.5 MG tablet Take 0.5 mg by mouth at bedtime as needed.    Marland Kitchen aspirin 81 MG tablet Take 81 mg by mouth daily.    Marland Kitchen atorvastatin (LIPITOR) 10 MG tablet Take 10 mg by mouth daily.  . betamethasone dipropionate (DIPROLENE) 0.05 % ointment Apply topically 2 (two) times daily.  . chlorthalidone (HYGROTON) 25 MG tablet Take 25 mg by mouth every morning.    . Cholecalciferol (VITAMIN D3) 1000 UNITS CAPS Take 1 Units by mouth daily.    . ciprofloxacin (CIPRO) 500 MG tablet Take 500 mg by mouth 2 (two) times daily.  . clobetasol (TEMOVATE) 0.05 % cream Apply topically 2 (two) times daily as needed.    . Cyanocobalamin (VITAMIN B-12 CR)  1500 MCG TBCR Take by mouth daily.  Marland Kitchen doxycycline (VIBRAMYCIN) 100 MG capsule Take 100 mg by mouth 2 (two) times daily.  Marland Kitchen escitalopram (LEXAPRO) 20 MG tablet Take 20 mg by mouth daily.  Marland Kitchen esomeprazole (NEXIUM) 40 MG capsule Take 40 mg by mouth 2 (two) times daily.   . fish oil-omega-3 fatty acids 1000 MG capsule Take 1 g by mouth daily.    . fluocinonide cream (LIDEX) 0.05 % Apply topically 2 (two) times daily.  . Fluticasone-Salmeterol (ADVAIR)  250-50 MCG/DOSE AEPB Inhale 1 puff into the lungs every 12 (twelve) hours.    . folic acid (FOLVITE) 1 MG tablet Take 1 mg by mouth daily.  Marland Kitchen gabapentin (NEURONTIN) 600 MG tablet Take 600 mg by mouth 2 (two) times daily. Pt may take up to 6 Tabs if needed per day.  Marland Kitchen glucose blood (ACCU-CHEK AVIVA PLUS) test strip 1 each by Other route as needed. Use as instructed   . ibuprofen (ADVIL,MOTRIN) 200 MG tablet Take 200 mg by mouth 2 (two) times daily as needed.    . Ibuprofen-Diphenhydramine Cit (ADVIL PM PO) Take 2 tablets by mouth as needed.   . insulin glargine (LANTUS) 100 UNIT/ML injection Inject 35 Units into the skin daily.   Marland Kitchen losartan (COZAAR) 100 MG tablet Take 100 mg by mouth daily.    . metFORMIN (GLUMETZA) 1000 MG (MOD) 24 hr tablet Take 1,000 mg by mouth 2 (two) times daily with a meal.    . methocarbamol (ROBAXIN) 750 MG tablet Take 750 mg by mouth daily.  . methotrexate (RHEUMATREX) 2.5 MG tablet Take 15 mg by mouth once a week. Caution:Chemotherapy. Protect from light.  . metoprolol (TOPROL-XL) 100 MG 24 hr tablet Take 100 mg by mouth daily after breakfast.    . Multiple Vitamin (MULTIVITAMIN) tablet Take 1 tablet by mouth daily.    . nitroGLYCERIN (NITROSTAT) 0.4 MG SL tablet Place 1 tablet (0.4 mg total) under the tongue every 5 (five) minutes as needed for chest pain.  . nortriptyline (PAMELOR) 10 MG capsule Take 10 mg by mouth at bedtime.  . OxyCODONE (OXYCONTIN) 40 mg T12A 12 hr tablet Take 40 mg by mouth every 12 (twelve) hours.  Marland Kitchen oxyCODONE-acetaminophen (PERCOCET) 7.5-325 MG per tablet Take 1 tablet by mouth 4 (four) times daily as needed. 10-325 mg  . potassium chloride (KLOR-CON) 10 MEQ CR tablet Take 10 mEq by mouth daily.    . psyllium (REGULOID) 0.52 G capsule Take 0.52 g by mouth daily.    Marland Kitchen saccharomyces boulardii (FLORASTOR) 250 MG capsule Take 250 mg by mouth daily. PROBIOTIC  . sitaGLIPtin (JANUVIA) 100 MG tablet Take 100 mg by mouth daily.  . Varenicline Tartrate  (CHANTIX PO) Take 1 tablet by mouth 2 (two) times daily.  . VENTOLIN HFA 108 (90 BASE) MCG/ACT inhaler Inhale 2 puffs into the lungs 4 times daily.    Allergy:  Allergies  Allergen Reactions  . Sulfa Antibiotics Other (See Comments)    Unsure of reaction    Social Hx:   History   Social History  . Marital Status: Married    Spouse Name: N/A    Number of Children: N/A  . Years of Education: N/A   Occupational History  . Not on file.   Social History Main Topics  . Smoking status: Former Smoker -- 1.00 packs/day for 40 years    Types: Cigarettes    Quit date: 03/15/2013  . Smokeless tobacco: Never Used  . Alcohol Use: No  .  Drug Use: No  . Sexual Activity: No   Other Topics Concern  . Not on file   Social History Narrative  . No narrative on file    Past Surgical Hx:  Past Surgical History  Procedure Laterality Date  . Cholecystectomy      biliteral  . Cataract extraction      2013  . Eye surgery Right Aug. 2013    Cataract  . Eye surgery Left Sept. 2013    Cataract  . Back surgery  Aug/sept 2014    2 back surgeries  (Aug 2014 & Sept 2014)  . Spine surgery  Jan. 8, 2015    Back  . Abdominal hysterectomy  2009    Past Medical Hx:  Past Medical History  Diagnosis Date  . Diabetes mellitus   . Hypertension   . COPD (chronic obstructive pulmonary disease)   . Depression   . Osteoarthritis   . Ventral hernia     postoperative since 2009  . Peripheral neuropathy     following chemotherapy  . Carotid artery occlusion   . Endometrial ca     endometrial ca dx 4/11  . 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 Radiation 05/01/2011-06/11/11    5600 cGy 28 fxs/periaortic  . Anemia     Oncology Hx:    Malignant neoplasm of corpus uteri, except isthmus   05/18/2008 Initial Diagnosis Malignant neoplasm of corpus uteri, except isthmus, TAH/BSO IIIC2    - 10/03/2009 Chemotherapy Completed sandwich chemotherapy and radiation    03/04/2011 Relapse/Recurrence + PA node recurrence    Radiation Therapy radiation to nodal recurrence    Family Hx:  Family History  Problem Relation Age of Onset  . Stroke Mother   . Heart attack Father   . Heart disease Father     Heart Disease before age 19  . Hyperlipidemia Father   . Hypertension Father   . Diabetes Daughter     Vitals:  Blood pressure 166/70, pulse 84, temperature 98.7 F (37.1 C), temperature source Oral, resp. rate 21, height 5\' 6"  (1.676 m), weight 182 lb 1.6 oz (82.6 kg).  Physical Exam: Well-nourished well-developed female   Neck: Supple no lymphadenopathy no thyromegaly.   Lungs: Clear to auscultation both inspiratory and expiratory wheezes throughout bilateral lung fields.  Cardiovascular: Regular rate and rhythm.    Abdomen: Large hernia measuring about a 25 x 25 cm to the right side of the incision. Abdomen is soft and nontender. There is no obvious masses but exam is limited by her habitus as well as the large hernia.   Groins: No lymphadenopathy. Positive candida in the intertriginous zones.  Extremities: No edema scattered psoriasis which is markedly improved.  Pelvic: Normal external female genitalia. The vagina is markedly atrophic. The vaginal cuff is no visible lesions. Thin prep Pap smear was admitted without difficulty. Bimanual examination reveals no masses or nodularity. Rectal confirms. Exam is limited by habitus and the large hernia.  Assessment/Plan:  63 year old with history of stage IIIc endometrial carcinoma diagnosed in 2009 the periaortic recurrence in 2011 treated with radiation therapy.  Plan:  1) We will followup on the results of your Pap smear from today. We'll notify her of the results.  2) She is going 6 years out from her diagnosis. If her Pap smear was unremarkable and she can followup with [place] when necessary. She will like to return to her gynecologist for routine GYN care. We again  discussed colonoscopy. In the  setting of a thickening noted on CT scan I strongly encouraged her to do this. She stay she take the responsibility of getting up scheduled. She will go to the gastroenterologist perform her husband colonoscopy as he had a good experience 3) She knows that we will be happy to see her in the future should the need arise. We again discussed smoking cessation particular in the setting of her needing to have both back surgery as well as surgery for carotid stenosis.       Vannessa Godown A., MD 08/23/2014, 1:36 PM

## 2014-08-23 NOTE — Patient Instructions (Signed)
We will notify you of the results of your Pap smear. As it has been greater than 6 years since her diagnosis she can followup with your gynecologist for followup. As we discussed I would strongly encourage you to consider getting a colonoscopy. Please let us know we can assist with been arranged for you.

## 2014-08-28 ENCOUNTER — Telehealth: Payer: Self-pay | Admitting: *Deleted

## 2014-08-28 DIAGNOSIS — R339 Retention of urine, unspecified: Secondary | ICD-10-CM | POA: Insufficient documentation

## 2014-08-28 DIAGNOSIS — Z9889 Other specified postprocedural states: Secondary | ICD-10-CM | POA: Insufficient documentation

## 2014-08-28 DIAGNOSIS — Z792 Long term (current) use of antibiotics: Secondary | ICD-10-CM | POA: Insufficient documentation

## 2014-08-28 DIAGNOSIS — R300 Dysuria: Secondary | ICD-10-CM | POA: Insufficient documentation

## 2014-08-28 DIAGNOSIS — D72829 Elevated white blood cell count, unspecified: Secondary | ICD-10-CM | POA: Insufficient documentation

## 2014-08-28 DIAGNOSIS — R112 Nausea with vomiting, unspecified: Secondary | ICD-10-CM | POA: Insufficient documentation

## 2014-08-28 DIAGNOSIS — N390 Urinary tract infection, site not specified: Secondary | ICD-10-CM | POA: Insufficient documentation

## 2014-08-28 DIAGNOSIS — M4620 Osteomyelitis of vertebra, site unspecified: Secondary | ICD-10-CM | POA: Insufficient documentation

## 2014-08-28 LAB — CYTOLOGY - PAP

## 2014-08-28 NOTE — Telephone Encounter (Signed)
Called pt unable to reach, lmovm for pt pap smear was normal.

## 2014-08-28 NOTE — Telephone Encounter (Signed)
Message copied by Lucile Crater on Mon Aug 28, 2014  4:44 PM ------      Message from: CROSS, MELISSA D      Created: Mon Aug 28, 2014  2:04 PM       Please let her know that her pap smear is normal.             ----- Message -----         From: [facility]         Sent: 08/28/2014  11:29 AM           To: Dorothyann Gibbs, NP                   ------

## 2014-09-05 ENCOUNTER — Encounter: Payer: Self-pay | Admitting: Internal Medicine

## 2014-10-24 ENCOUNTER — Other Ambulatory Visit: Payer: Self-pay | Admitting: Orthopaedic Surgery

## 2014-10-24 DIAGNOSIS — T8140XA Infection following a procedure, unspecified, initial encounter: Secondary | ICD-10-CM

## 2014-11-06 ENCOUNTER — Encounter: Payer: Self-pay | Admitting: Internal Medicine

## 2014-11-06 ENCOUNTER — Ambulatory Visit (INDEPENDENT_AMBULATORY_CARE_PROVIDER_SITE_OTHER): Payer: Medicare Other | Admitting: Internal Medicine

## 2014-11-06 VITALS — BP 130/60 | HR 64 | Ht 65.0 in | Wt 175.2 lb

## 2014-11-06 DIAGNOSIS — K5903 Drug induced constipation: Secondary | ICD-10-CM

## 2014-11-06 DIAGNOSIS — I6529 Occlusion and stenosis of unspecified carotid artery: Secondary | ICD-10-CM

## 2014-11-06 DIAGNOSIS — K5909 Other constipation: Secondary | ICD-10-CM

## 2014-11-06 DIAGNOSIS — R933 Abnormal findings on diagnostic imaging of other parts of digestive tract: Secondary | ICD-10-CM

## 2014-11-06 DIAGNOSIS — R1310 Dysphagia, unspecified: Secondary | ICD-10-CM

## 2014-11-06 DIAGNOSIS — T402X5A Adverse effect of other opioids, initial encounter: Secondary | ICD-10-CM

## 2014-11-06 DIAGNOSIS — D62 Acute posthemorrhagic anemia: Secondary | ICD-10-CM

## 2014-11-06 MED ORDER — MOVIPREP 100 G PO SOLR: ORAL | Status: DC

## 2014-11-06 NOTE — Patient Instructions (Signed)
You have been scheduled for a colonoscopy. Please follow written instructions given to you at your visit today.  Please pick up your prep kit at the pharmacy within the next 1-3 days. If you use inhalers (even only as needed), please bring them with you on the day of your procedure. Your physician has requested that you go to www.[email or web address].com and enter the access code given to you at your visit today. This web site gives a general overview about your procedure. However, you should still follow specific instructions given to you by our office regarding your preparation for the procedure.   I appreciate the opportunity to care for you.

## 2014-11-06 NOTE — Progress Notes (Signed)
Referred by Dr. Rogelio Seen and Dr. Sandi Mariscal Subjective:    Patient ID: Lauren [name], female    DOB: 1951-12-22, 63 y.o.   MRN: [id number]  HPI This lady is here in setting of a history of endometrial cancer resected in 2009,  With metastatic disease to spine and back found 2012 and treated with XRT. Recent  CT scanning has shown thickening of cecum and terminal ileum and colonoscopy has been suggested to further evaluate. She also has a large abdominal wall hernia containing loops of bowel.  She has chronic constipation on narcotics. Just started dulcolax. Some fecal seepage She also has a chronic mild dysphagia x yers - soilds "don't go down right". No heartburn on Nexium.  Has a large ventral hernia with bulge since hysterectomy. This causes some pain at times.  Hemoccults negative 03/27/14.  Has carotid stenosis and will have surgery next month most likely - asymptomatic  Allergies  Allergen Reactions  . Sulfa Antibiotics Other (See Comments)    Unsure of reaction  . Ambien [Zolpidem Tartrate]   . Amitriptyline Hcl     Gastritis  . Fluoxetine     Anxiety, irritability   Outpatient Prescriptions Prior to Visit  Medication Sig Dispense Refill  . ALPRAZolam (XANAX) 0.5 MG tablet Take 0.5 mg by mouth at bedtime as needed.      Marland Kitchen aspirin 81 MG tablet Take 81 mg by mouth daily.      Marland Kitchen atorvastatin (LIPITOR) 10 MG tablet Take 10 mg by mouth daily.    . betamethasone dipropionate (DIPROLENE) 0.05 % ointment Apply topically 2 (two) times daily.    . chlorthalidone (HYGROTON) 25 MG tablet Take 25 mg by mouth every morning.      . Cholecalciferol (VITAMIN D3) 1000 UNITS CAPS Take 1 Units by mouth daily.      . Cyanocobalamin (VITAMIN B-12 CR) 1500 MCG TBCR Take by mouth daily.    Marland Kitchen escitalopram (LEXAPRO) 20 MG tablet Take 20 mg by mouth daily.    Marland Kitchen esomeprazole (NEXIUM) 40 MG capsule Take 40 mg by mouth daily.     . fish oil-omega-3 fatty acids 1000 MG capsule Take 1 g by mouth daily.       . Fluticasone-Salmeterol (ADVAIR) 250-50 MCG/DOSE AEPB Inhale 1 puff into the lungs every 12 (twelve) hours.      . folic acid (FOLVITE) 1 MG tablet Take 1 mg by mouth daily.    Marland Kitchen gabapentin (NEURONTIN) 600 MG tablet Take 600 mg by mouth 2 (two) times daily. Pt may take up to 6 Tabs if needed per day.    Marland Kitchen glucose blood (ACCU-CHEK AVIVA PLUS) test strip 1 each by Other route as needed. Use as instructed     . insulin glargine (LANTUS) 100 UNIT/ML injection Inject 35 Units into the skin daily.     Marland Kitchen losartan (COZAAR) 100 MG tablet Take 100 mg by mouth daily.      . metFORMIN (GLUMETZA) 1000 MG (MOD) 24 hr tablet Take 1,000 mg by mouth 2 (two) times daily with a meal.      . methocarbamol (ROBAXIN) 750 MG tablet Take 750 mg by mouth daily.    . methotrexate (RHEUMATREX) 2.5 MG tablet Take 15 mg by mouth once a week. Caution:Chemotherapy. Protect from light.    . metoprolol (TOPROL-XL) 100 MG 24 hr tablet Take 100 mg by mouth daily after breakfast.      . Multiple Vitamin (MULTIVITAMIN) tablet Take 1 tablet by mouth daily.      Marland Kitchen  nortriptyline (PAMELOR) 10 MG capsule Take 10 mg by mouth at bedtime.    . OxyCODONE (OXYCONTIN) 40 mg T12A 12 hr tablet Take 40 mg by mouth every 12 (twelve) hours.    Marland Kitchen oxyCODONE-acetaminophen (PERCOCET) 7.5-325 MG per tablet Take 1 tablet by mouth 4 (four) times daily as needed. 10-325 mg    . potassium chloride (KLOR-CON) 10 MEQ CR tablet Take 10 mEq by mouth daily.      . psyllium (REGULOID) 0.52 G capsule Take 0.52 g by mouth daily.      Marland Kitchen saccharomyces boulardii (FLORASTOR) 250 MG capsule Take 250 mg by mouth daily. PROBIOTIC    . VENTOLIN HFA 108 (90 BASE) MCG/ACT inhaler Inhale 2 puffs into the lungs 4 times daily.    . nitroGLYCERIN (NITROSTAT) 0.4 MG SL tablet Place 1 tablet (0.4 mg total) under the tongue every 5 (five) minutes as needed for chest pain. 25 tablet 3  . ciprofloxacin (CIPRO) 500 MG tablet Take 500 mg by mouth 2 (two) times daily.    .  clobetasol (TEMOVATE) 0.05 % cream Apply topically 2 (two) times daily as needed.      . doxycycline (VIBRAMYCIN) 100 MG capsule Take 100 mg by mouth 2 (two) times daily.    . fluocinonide cream (LIDEX) 0.05 % Apply topically 2 (two) times daily.    Marland Kitchen ibuprofen (ADVIL,MOTRIN) 200 MG tablet Take 200 mg by mouth 2 (two) times daily as needed.      . Ibuprofen-Diphenhydramine Cit (ADVIL PM PO) Take 2 tablets by mouth as needed.     . sitaGLIPtin (JANUVIA) 100 MG tablet Take 100 mg by mouth daily.    . Varenicline Tartrate (CHANTIX PO) Take 1 tablet by mouth 2 (two) times daily.     No facility-administered medications prior to visit.   Past Medical History  Diagnosis Date  . Diabetes mellitus   . Hypertension   . COPD (chronic obstructive pulmonary disease)   . Depression   . Osteoarthritis   . Ventral hernia     postoperative since 2009  . Peripheral neuropathy     following chemotherapy  . Carotid artery occlusion   . Endometrial ca     endometrial ca dx 4/11  . 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 Radiation 05/01/2011-06/11/11    5600 cGy 28 fxs/periaortic  . Anemia   . GERD (gastroesophageal reflux disease)   . Insomnia   . Hyperlipidemia   . OAB (overactive bladder)   . Psoriasis   . DDD (degenerative disc disease), lumbar   . Gout 2015  . TIA (transient ischemic attack) 2015  . Uterine cancer   . Hernia of flank    Past Surgical History  Procedure Laterality Date  . Cholecystectomy      biliteral  . Eye surgery Right Aug. 2013    Cataract, lens implant  . Eye surgery Left Sept. 2013    Cataract, lens implant  . Back surgery  Aug/sept 2014    3 back surgeries  (Aug 2014 & Sept 2014)  . I & d of lumbar fusion /removal of hardware for chronic infection  Jan. 8, 2015  . Abdominal hysterectomy  2009    for ca   History   Social History  . Marital Status: Married    Spouse Name: N/A    Number of Children: 1  . Years of Education: N/A    Occupational History  . disabled  Social History Main Topics  . Smoking status: Current Every Day Smoker -- 1.00 packs/day for 40 years    Types: Cigarettes  . Smokeless tobacco: Never Used  . Alcohol Use: No  . Drug Use: No  . Sexual Activity: No          Social History Narrative  Married, 1 daughter disabled Family History  Problem Relation Age of Onset  . Stroke Mother   . Heart attack Father 79    died  . Heart disease Father     Heart Disease before age 65  . Hyperlipidemia Father   . Hypertension Father   . Diabetes Daughter   . Hypertension Daughter   . Irritable bowel syndrome Mother   . Irritable bowel syndrome Daughter     Review of Systems Chronic back pain, fatigue, ambulates with a walker All other ROS negative or as per HPI    Objective:   Physical Exam General:   chronically ill ww using a walker Eyes:  anicteric. ENT:   Mouth and posterior pharynx free of lesions.  Neck:   supple w/o thyromegaly or mass.  Lungs: Fair air movement, scattered wheezes to auscultation bilaterally. Heart:  S1S2, no rubs, murmurs, gallops. Abdomen:  soft, non-tender, no hepatosplenomegaly,  large ventral hernia, or mass and BS+.  Rectal:  deferred Lymph:  no cervical or supraclavicular adenopathy. Extremities:   no edema Skin   no rash. Neuro:  A&O x 3.  Psych:  appropriate mood and  Affect.   Data Reviewed: Gyn onc notes, Abd/pelvicCT scan and viewed images from 2015 Vascular surgery notes Labs in EMR PCP notes/labs - scanned      Assessment & Plan:  Abnormal CT scan, colon in setting of metastatic endometrial cancer - Plan: Ambulatory referral to Gastroenterology  Postoperative anemia due to acute blood loss  Constipation due to opioid therapy  Dysphagia  1) colonoscopy 2) The risks and benefits as well as alternatives of endoscopic procedure(s) have been discussed and reviewed. All questions answered. The patient agrees to proceed. 3)  abdominal binder for colonoscopy given the hernia 4) monitor chronic mild dysphagia 5) adjust laxatives pending colonoscopy, ? Amitiza for narcotic-induced constipation 6) Await f/u spine CT  I appreciate the opportunity to care for this patient. EH:[id number],GEZMO F, MD and Norberta Keens., MD

## 2014-11-08 ENCOUNTER — Ambulatory Visit
Admission: RE | Admit: 2014-11-08 | Discharge: 2014-11-08 | Disposition: A | Discharge status: Home / Self Care | Payer: Medicare Other | Source: Ambulatory Visit | Attending: Orthopaedic Surgery | Admitting: Orthopaedic Surgery

## 2014-11-08 DIAGNOSIS — T8140XA Infection following a procedure, unspecified, initial encounter: Secondary | ICD-10-CM

## 2014-11-08 MED ORDER — DIAZEPAM 5 MG PO TABS
5.0000 mg | ORAL_TABLET | Freq: Once | ORAL | Status: AC
  Administered 2014-11-08: 5 mg via ORAL

## 2014-11-08 MED ORDER — IOHEXOL 180 MG/ML  SOLN
15.0000 mL | Freq: Once | INTRAMUSCULAR | Status: AC | PRN
  Administered 2014-11-08: 15 mL via INTRATHECAL

## 2014-11-08 NOTE — Discharge Instructions (Signed)
Myelogram Discharge Instructions  1. Go home and rest quietly for the next 24 hours.  It is important to lie flat for the next 24 hours.  Get up only to go to the restroom.  You may lie in the bed or on a couch on your back, your stomach, your left side or your right side.  You may have one pillow under your head.  You may have pillows between your knees while you are on your side or under your knees while you are on your back.  2. DO NOT drive today.  Recline the seat as far back as it will go, while still wearing your seat belt, on the way home.  3. You may get up to go to the bathroom as needed.  You may sit up for 10 minutes to eat.  You may resume your normal diet and medications unless otherwise indicated.  Drink lots of extra fluids today and tomorrow.  4. The incidence of headache, nausea, or vomiting is about 5% (one in 20 patients).  If you develop a headache, lie flat and drink plenty of fluids until the headache goes away.  Caffeinated beverages may be helpful.  If you develop severe nausea and vomiting or a headache that does not go away with flat bed rest, call [phone].  5. You may resume normal activities after your 24 hours of bed rest is over; however, do not exert yourself strongly or do any heavy lifting tomorrow. If when you get up you have a headache when standing, go back to bed and force fluids for another 24 hours.  6. Call your physician for a follow-up appointment.  The results of your myelogram will be sent directly to your physician by the following day.  7. If you have any questions or if complications develop after you arrive home, please call [phone].  Discharge instructions have been explained to the patient.  The patient, or the person responsible for the patient, fully understands these instructions.      May resume  Escitalopram and Nortriptyline on Nov. 11, 2015, after 1:00 pm.

## 2014-11-08 NOTE — Progress Notes (Signed)
Patient's husband states she has been off Lexapro/Escitalopram and Pamelor/Nortriptyline for the past two days.

## 2014-11-09 ENCOUNTER — Other Ambulatory Visit: Payer: Medicare Other

## 2014-11-13 ENCOUNTER — Ambulatory Visit (AMBULATORY_SURGERY_CENTER): Payer: Medicare Other | Admitting: Internal Medicine

## 2014-11-13 ENCOUNTER — Encounter: Payer: Self-pay | Admitting: Internal Medicine

## 2014-11-13 VITALS — BP 146/73 | HR 58 | Temp 97.9°F | Resp 30 | Ht 65.0 in | Wt 175.0 lb

## 2014-11-13 DIAGNOSIS — D122 Benign neoplasm of ascending colon: Secondary | ICD-10-CM

## 2014-11-13 DIAGNOSIS — R933 Abnormal findings on diagnostic imaging of other parts of digestive tract: Secondary | ICD-10-CM

## 2014-11-13 DIAGNOSIS — K5520 Angiodysplasia of colon without hemorrhage: Secondary | ICD-10-CM | POA: Insufficient documentation

## 2014-11-13 DIAGNOSIS — R198 Other specified symptoms and signs involving the digestive system and abdomen: Secondary | ICD-10-CM

## 2014-11-13 HISTORY — PX: COLONOSCOPY: SHX174

## 2014-11-13 LAB — GLUCOSE, CAPILLARY
GLUCOSE-CAPILLARY: 102 mg/dL — AB (ref 70–99)
Glucose-Capillary: 86 mg/dL (ref 70–99)

## 2014-11-13 MED ORDER — SODIUM CHLORIDE 0.9 % IV SOLN: 500.0000 mL | INTRAVENOUS | Status: DC

## 2014-11-13 NOTE — Progress Notes (Signed)
Procedure ends, to recovery, report given and VSS. 

## 2014-11-13 NOTE — Op Note (Signed)
[facility]  [address] [place], [address]   COLONOSCOPY PROCEDURE REPORT  PATIENT: Lauren, [name]  MR#: [id number] BIRTHDATE: January 22, 1951 , 63  yrs. old GENDER: female ENDOSCOPIST: Gatha Mayer, MD, [facility] PROCEDURE DATE:  11/13/2014 PROCEDURE:   Colonoscopy with snare polypectomy First Screening Colonoscopy - Avg.  risk and is 50 yrs.  old or older - No.  Prior Negative Screening - Now for repeat screening. N/A  History of Adenoma - Now for follow-up colonoscopy & has been > or = to 3 yrs.  N/A  Polyps Removed Today? Yes. ASA CLASS:   Class III INDICATIONS:abnormal right colon/ileum on CT in setting of metastatic endometrial cancer. MEDICATIONS: Propofol 180 mg IV and Monitored anesthesia care  DESCRIPTION OF PROCEDURE:   After the risks benefits and alternatives of the procedure were thoroughly explained, informed consent was obtained.  The digital rectal exam revealed decreased sphincter tone and revealed no rectal mass.   The LB [id number]-[id number]  endoscope was introduced through the anus and advanced to the terminal ileum which was intubated for a short distance. No adverse events experienced.   The quality of the prep was good, using MoviPrep  The instrument was then slowly withdrawn as the colon was fully examined.      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  Retroflexed views revealed no abnormalities. The time to cecum=3 minutes 45 seconds. Withdrawal time=9 minutes 29 seconds.  The scope was withdrawn and the procedure completed. COMPLICATIONS: There were no immediate complications.  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

## 2014-11-13 NOTE — Patient Instructions (Addendum)
I found and removed one tiny polyp. You also have a small AVM - blood vessels on surface of colon - not usually a problem. No signs of cancer.  I will let you know polyp results.  I appreciate the opportunity to care for you. Gatha Mayer, MD, FACG  YOU HAD AN ENDOSCOPIC PROCEDURE TODAY AT [facility]: Refer to the procedure report that was given to you for any specific questions about what was found during the examination.  If the procedure report does not answer your questions, please call your gastroenterologist to clarify.  If you requested that your care partner not be given the details of your procedure findings, then the procedure report has been included in a sealed envelope for you to review at your convenience later.  YOU SHOULD EXPECT: Some feelings of bloating in the abdomen. Passage of more gas than usual.  Walking can help get rid of the air that was put into your GI tract during the procedure and reduce the bloating. If you had a lower endoscopy (such as a colonoscopy or flexible sigmoidoscopy) you may notice spotting of blood in your stool or on the toilet paper. If you underwent a bowel prep for your procedure, then you may not have a normal bowel movement for a few days.  DIET: Your first meal following the procedure should be a light meal and then it is ok to progress to your normal diet.  A half-sandwich or bowl of soup is an example of a good first meal.  Heavy or fried foods are harder to digest and may make you feel nauseous or bloated.  Likewise meals heavy in dairy and vegetables can cause extra gas to form and this can also increase the bloating.  Drink plenty of fluids but you should avoid alcoholic beverages for 24 hours.  ACTIVITY: Your care partner should take you home directly after the procedure.  You should plan to take it easy, moving slowly for the rest of the day.  You can resume normal activity the day after the procedure however you  should NOT DRIVE or use heavy machinery for 24 hours (because of the sedation medicines used during the test).    SYMPTOMS TO REPORT IMMEDIATELY: A gastroenterologist can be reached at any hour.  During normal business hours, 8:30 AM to 5:00 PM Monday through Friday, call [phone].  After hours and on weekends, please call the GI answering service at [phone] who will take a message and have the physician on call contact you.   Following lower endoscopy (colonoscopy or flexible sigmoidoscopy):  Excessive amounts of blood in the stool  Significant tenderness or worsening of abdominal pains  Swelling of the abdomen that is new, acute  Fever of 100F or higher   FOLLOW UP: If any biopsies were taken you will be contacted by phone or by letter within the next 1-3 weeks.  Call your gastroenterologist if you have not heard about the biopsies in 3 weeks.  Our staff will call the home number listed on your records the next business day following your procedure to check on you and address any questions or concerns that you may have at that time regarding the information given to you following your procedure. This is a courtesy call and so if there is no answer at the home number and we have not heard from you through the emergency physician on call, we will assume that you have returned to your regular  daily activities without incident.  SIGNATURES/CONFIDENTIALITY: You and/or your care partner have signed paperwork which will be entered into your electronic medical record.  These signatures attest to the fact that that the information above on your After Visit Summary has been reviewed and is understood.  Full responsibility of the confidentiality of this discharge information lies with you and/or your care-partner.  Polyp information given.

## 2014-11-13 NOTE — Progress Notes (Signed)
Called to room to assist during endoscopic procedure.  Patient ID and intended procedure confirmed with present staff. Received instructions for my participation in the procedure from the performing physician.  

## 2014-11-13 NOTE — Progress Notes (Signed)
Pt's blood sugar was 86 reported by Randye Lobo, CMA to me.  Pt said "this is low for me, it usually runs 130's".  Did not report any sx of low blood sugar though.  Per Dr. Carlean Purl no orders given.  Also pt reported her 2nd bottle of movi prep did not move her bowel at all.  The first one did.  Pt reports last results was a beige, cloudy color liquid.  Per Dr. Carlean Purl no enema needed. maw

## 2014-11-14 ENCOUNTER — Telehealth: Payer: Self-pay | Admitting: *Deleted

## 2014-11-14 NOTE — Telephone Encounter (Signed)
No answer, left message to call if questions or concerns. 

## 2014-11-16 ENCOUNTER — Encounter: Payer: Self-pay | Admitting: Internal Medicine

## 2014-11-16 NOTE — Progress Notes (Signed)
Quick Note:  Diminutive adenoma Do not recommend a repeat routine colonoscopy given co-morbidities ______

## 2015-01-29 ENCOUNTER — Encounter: Payer: Self-pay | Admitting: Family

## 2015-01-30 ENCOUNTER — Encounter: Payer: Self-pay | Admitting: Family

## 2015-01-30 ENCOUNTER — Ambulatory Visit (HOSPITAL_COMMUNITY)
Admission: RE | Admit: 2015-01-30 | Discharge: 2015-01-30 | Disposition: A | Discharge status: Home / Self Care | Payer: Medicare Other | Source: Ambulatory Visit | Attending: Family | Admitting: Family

## 2015-01-30 ENCOUNTER — Ambulatory Visit (INDEPENDENT_AMBULATORY_CARE_PROVIDER_SITE_OTHER): Payer: Medicare Other | Admitting: Family

## 2015-01-30 VITALS — BP 113/65 | HR 47 | Resp 16 | Ht 66.5 in | Wt 173.0 lb

## 2015-01-30 DIAGNOSIS — Z720 Tobacco use: Secondary | ICD-10-CM | POA: Insufficient documentation

## 2015-01-30 DIAGNOSIS — F17200 Nicotine dependence, unspecified, uncomplicated: Secondary | ICD-10-CM

## 2015-01-30 DIAGNOSIS — I6523 Occlusion and stenosis of bilateral carotid arteries: Secondary | ICD-10-CM

## 2015-01-30 DIAGNOSIS — I6529 Occlusion and stenosis of unspecified carotid artery: Secondary | ICD-10-CM

## 2015-01-30 NOTE — Patient Instructions (Signed)
Stroke Prevention Some medical conditions and behaviors are associated with an increased chance of having a stroke. You may prevent a stroke by making healthy choices and managing medical conditions. HOW CAN I REDUCE MY RISK OF HAVING A STROKE?   Stay physically active. Get at least 30 minutes of activity on most or all days.  Do not smoke. It may also be helpful to avoid exposure to secondhand smoke.  Limit alcohol use. Moderate alcohol use is considered to be:  No more than 2 drinks per day for men.  No more than 1 drink per day for nonpregnant women.  Eat healthy foods. This involves:  Eating 5 or more servings of fruits and vegetables a day.  Making dietary changes that address high blood pressure (hypertension), high cholesterol, diabetes, or obesity.  Manage your cholesterol levels.  Making food choices that are high in fiber and low in saturated fat, trans fat, and cholesterol may control cholesterol levels.  Take any prescribed medicines to control cholesterol as directed by your health care provider.  Manage your diabetes.  Controlling your carbohydrate and sugar intake is recommended to manage diabetes.  Take any prescribed medicines to control diabetes as directed by your health care provider.  Control your hypertension.  Making food choices that are low in salt (sodium), saturated fat, trans fat, and cholesterol is recommended to manage hypertension.  Take any prescribed medicines to control hypertension as directed by your health care provider.  Maintain a healthy weight.  Reducing calorie intake and making food choices that are low in sodium, saturated fat, trans fat, and cholesterol are recommended to manage weight.  Stop drug abuse.  Avoid taking birth control pills.  Talk to your health care provider about the risks of taking birth control pills if you are over 35 years old, smoke, get migraines, or have ever had a blood clot.  Get evaluated for sleep  disorders (sleep apnea).  Talk to your health care provider about getting a sleep evaluation if you snore a lot or have excessive sleepiness.  Take medicines only as directed by your health care provider.  For some people, aspirin or blood thinners (anticoagulants) are helpful in reducing the risk of forming abnormal blood clots that can lead to stroke. If you have the irregular heart rhythm of atrial fibrillation, you should be on a blood thinner unless there is a good reason you cannot take them.  Understand all your medicine instructions.  Make sure that other conditions (such as anemia or atherosclerosis) are addressed. SEEK IMMEDIATE MEDICAL CARE IF:   You have sudden weakness or numbness of the face, arm, or leg, especially on one side of the body.  Your face or eyelid droops to one side.  You have sudden confusion.  You have trouble speaking (aphasia) or understanding.  You have sudden trouble seeing in one or both eyes.  You have sudden trouble walking.  You have dizziness.  You have a loss of balance or coordination.  You have a sudden, severe headache with no known cause.  You have new chest pain or an irregular heartbeat. Any of these symptoms may represent a serious problem that is an emergency. Do not wait to see if the symptoms will go away. Get medical help at once. Call your local emergency services (911 in [place] Do not drive yourself to the hospital. Document Released: 01/22/2005 Document Revised: 05/01/2014 Document Reviewed: 06/17/2013 [employer] Patient Information 2015 [employer]. This information is not intended to replace advice given   to you by your health care provider. Make sure you discuss any questions you have with your health care provider.    Smoking Cessation Quitting smoking is important to your health and has many advantages. However, it is not always easy to quit since nicotine is a very addictive drug. Oftentimes, people try 3 times or  more before being able to quit. This document explains the best ways for you to prepare to quit smoking. Quitting takes hard work and a lot of effort, but you can do it. ADVANTAGES OF QUITTING SMOKING  You will live longer, feel better, and live better.  Your body will feel the impact of quitting smoking almost immediately.  Within 20 minutes, blood pressure decreases. Your pulse returns to its normal level.  After 8 hours, carbon monoxide levels in the blood return to normal. Your oxygen level increases.  After 24 hours, the chance of having a heart attack starts to decrease. Your breath, hair, and body stop smelling like smoke.  After 48 hours, damaged nerve endings begin to recover. Your sense of taste and smell improve.  After 72 hours, the body is virtually free of nicotine. Your bronchial tubes relax and breathing becomes easier.  After 2 to 12 weeks, lungs can hold more air. Exercise becomes easier and circulation improves.  The risk of having a heart attack, stroke, cancer, or lung disease is greatly reduced.  After 1 year, the risk of coronary heart disease is cut in half.  After 5 years, the risk of stroke falls to the same as a nonsmoker.  After 10 years, the risk of lung cancer is cut in half and the risk of other cancers decreases significantly.  After 15 years, the risk of coronary heart disease drops, usually to the level of a nonsmoker.  If you are pregnant, quitting smoking will improve your chances of having a healthy baby.  The people you live with, especially any children, will be healthier.  You will have extra money to spend on things other than cigarettes. QUESTIONS TO THINK ABOUT BEFORE ATTEMPTING TO QUIT You may want to talk about your answers with your health care provider.  Why do you want to quit?  If you tried to quit in the past, what helped and what did not?  What will be the most difficult situations for you after you quit? How will you plan to  handle them?  Who can help you through the tough times? Your family? Friends? A health care provider?  What pleasures do you get from smoking? What ways can you still get pleasure if you quit? Here are some questions to ask your health care provider:  How can you help me to be successful at quitting?  What medicine do you think would be best for me and how should I take it?  What should I do if I need more help?  What is smoking withdrawal like? How can I get information on withdrawal? GET READY  Set a quit date.  Change your environment by getting rid of all cigarettes, ashtrays, matches, and lighters in your home, car, or work. Do not let people smoke in your home.  Review your past attempts to quit. Think about what worked and what did not. GET SUPPORT AND ENCOURAGEMENT You have a better chance of being successful if you have help. You can get support in many ways.  Tell your family, friends, and coworkers that you are going to quit and need their support. Ask them   not to smoke around you.  Get individual, group, or telephone counseling and support. Programs are available at local hospitals and health centers. Call your local health department for information about programs in your area.  Spiritual beliefs and practices may help some smokers quit.  Download a "quit meter" on your computer to keep track of quit statistics, such as how long you have gone without smoking, cigarettes not smoked, and money saved.  Get a self-help book about quitting smoking and staying off tobacco. LEARN NEW SKILLS AND BEHAVIORS  Distract yourself from urges to smoke. Talk to someone, go for a walk, or occupy your time with a task.  Change your normal routine. Take a different route to work. Drink tea instead of coffee. Eat breakfast in a different place.  Reduce your stress. Take a hot bath, exercise, or read a book.  Plan something enjoyable to do every day. Reward yourself for not  smoking.  Explore interactive web-based programs that specialize in helping you quit. GET MEDICINE AND USE IT CORRECTLY Medicines can help you stop smoking and decrease the urge to smoke. Combining medicine with the above behavioral methods and support can greatly increase your chances of successfully quitting smoking.  Nicotine replacement therapy helps deliver nicotine to your body without the negative effects and risks of smoking. Nicotine replacement therapy includes nicotine gum, lozenges, inhalers, nasal sprays, and skin patches. Some may be available over-the-counter and others require a prescription.  Antidepressant medicine helps people abstain from smoking, but how this works is unknown. This medicine is available by prescription.  Nicotinic receptor partial agonist medicine simulates the effect of nicotine in your brain. This medicine is available by prescription. Ask your health care provider for advice about which medicines to use and how to use them based on your health history. Your health care provider will tell you what side effects to look out for if you choose to be on a medicine or therapy. Carefully read the information on the package. Do not use any other product containing nicotine while using a nicotine replacement product.  RELAPSE OR DIFFICULT SITUATIONS Most relapses occur within the first 3 months after quitting. Do not be discouraged if you start smoking again. Remember, most people try several times before finally quitting. You may have symptoms of withdrawal because your body is used to nicotine. You may crave cigarettes, be irritable, feel very hungry, cough often, get headaches, or have difficulty concentrating. The withdrawal symptoms are only temporary. They are strongest when you first quit, but they will go away within 10-14 days. To reduce the chances of relapse, try to:  Avoid drinking alcohol. Drinking lowers your chances of successfully quitting.  Reduce the  amount of caffeine you consume. Once you quit smoking, the amount of caffeine in your body increases and can give you symptoms, such as a rapid heartbeat, sweating, and anxiety.  Avoid smokers because they can make you want to smoke.  Do not let weight gain distract you. Many smokers will gain weight when they quit, usually less than 10 pounds. Eat a healthy diet and stay active. You can always lose the weight gained after you quit.  Find ways to improve your mood other than smoking. FOR MORE INFORMATION  www.smokefree.gov  Document Released: 12/09/2001 Document Revised: 05/01/2014 Document Reviewed: 03/25/2012 [employer] Patient Information 2015 [employer]. This information is not intended to replace advice given to you by your health care provider. Make sure you discuss any questions you have with your health   care provider.    Smoking Cessation, Tips for Success If you are ready to quit smoking, congratulations! You have chosen to help yourself be healthier. Cigarettes bring nicotine, tar, carbon monoxide, and other irritants into your body. Your lungs, heart, and blood vessels will be able to work better without these poisons. There are many different ways to quit smoking. Nicotine gum, nicotine patches, a nicotine inhaler, or nicotine nasal spray can help with physical craving. Hypnosis, support groups, and medicines help break the habit of smoking. WHAT THINGS CAN I DO TO MAKE QUITTING EASIER?  Here are some tips to help you quit for good:  Pick a date when you will quit smoking completely. Tell all of your friends and family about your plan to quit on that date.  Do not try to slowly cut down on the number of cigarettes you are smoking. Pick a quit date and quit smoking completely starting on that day.  Throw away all cigarettes.   Clean and remove all ashtrays from your home, work, and car.  On a card, write down your reasons for quitting. Carry the card with you and read it  when you get the urge to smoke.  Cleanse your body of nicotine. Drink enough water and fluids to keep your urine clear or pale yellow. Do this after quitting to flush the nicotine from your body.  Learn to predict your moods. Do not let a bad situation be your excuse to have a cigarette. Some situations in your life might tempt you into wanting a cigarette.  Never have "just one" cigarette. It leads to wanting another and another. Remind yourself of your decision to quit.  Change habits associated with smoking. If you smoked while driving or when feeling stressed, try other activities to replace smoking. Stand up when drinking your coffee. Brush your teeth after eating. Sit in a different chair when you read the paper. Avoid alcohol while trying to quit, and try to drink fewer caffeinated beverages. Alcohol and caffeine may urge you to smoke.  Avoid foods and drinks that can trigger a desire to smoke, such as sugary or spicy foods and alcohol.  Ask people who smoke not to smoke around you.  Have something planned to do right after eating or having a cup of coffee. For example, plan to take a walk or exercise.  Try a relaxation exercise to calm you down and decrease your stress. Remember, you may be tense and nervous for the first 2 weeks after you quit, but this will pass.  Find new activities to keep your hands busy. Play with a pen, coin, or rubber band. Doodle or draw things on paper.  Brush your teeth right after eating. This will help cut down on the craving for the taste of tobacco after meals. You can also try mouthwash.   Use oral substitutes in place of cigarettes. Try using lemon drops, carrots, cinnamon sticks, or chewing gum. Keep them handy so they are available when you have the urge to smoke.  When you have the urge to smoke, try deep breathing.  Designate your home as a nonsmoking area.  If you are a heavy smoker, ask your health care provider about a prescription for  nicotine chewing gum. It can ease your withdrawal from nicotine.  Reward yourself. Set aside the cigarette money you save and buy yourself something nice.  Look for support from others. Join a support group or smoking cessation program. Ask someone at home or at work   to help you with your plan to quit smoking.  Always ask yourself, "Do I need this cigarette or is this just a reflex?" Tell yourself, "Today, I choose not to smoke," or "I do not want to smoke." You are reminding yourself of your decision to quit.  Do not replace cigarette smoking with electronic cigarettes (commonly called e-cigarettes). The safety of e-cigarettes is unknown, and some may contain harmful chemicals.  If you relapse, do not give up! Plan ahead and think about what you will do the next time you get the urge to smoke. HOW WILL I FEEL WHEN I QUIT SMOKING? You may have symptoms of withdrawal because your body is used to nicotine (the addictive substance in cigarettes). You may crave cigarettes, be irritable, feel very hungry, cough often, get headaches, or have difficulty concentrating. The withdrawal symptoms are only temporary. They are strongest when you first quit but will go away within 10-14 days. When withdrawal symptoms occur, stay in control. Think about your reasons for quitting. Remind yourself that these are signs that your body is healing and getting used to being without cigarettes. Remember that withdrawal symptoms are easier to treat than the major diseases that smoking can cause.  Even after the withdrawal is over, expect periodic urges to smoke. However, these cravings are generally short lived and will go away whether you smoke or not. Do not smoke! WHAT RESOURCES ARE AVAILABLE TO HELP ME QUIT SMOKING? Your health care provider can direct you to community resources or hospitals for support, which may include:  Group support.  Education.  Hypnosis.  Therapy. Document Released: 09/12/2004 Document  Revised: 05/01/2014 Document Reviewed: 06/02/2013 [employer] Patient Information 2015 [employer]. This information is not intended to replace advice given to you by your health care provider. Make sure you discuss any questions you have with your health care provider.  

## 2015-01-30 NOTE — Progress Notes (Signed)
Established Carotid Patient   History of Present Illness  Lauren [name] is a 64 y.o. female patient that Dr. Kellie Simmering saw in August 2015. At that time Dr. Kellie Simmering recommended waiting 6 months to repeat a carotid duplex exam. If she requires a back surgery prior to that time we should probably proceed with right carotid endarterectomy. Her lesion was in the 75-80% range and is asymptomatic. Dr. Kellie Simmering indicated she will need right carotid endarterectomy in the near future. If she should decide she would like to proceed with surgery now she will be back in touch with [place].  Patient has not had previous carotid artery intervention.  Patient has Negative history of TIA or stroke symptom. The patient denies amaurosis fugax or monocular blindness. The patient denies facial drooping. Pt. denies hemiplegia. The patient denies receptive or expressive aphasia.   She denies non healing wounds in feet/legs.  Pt reports New Medical or Surgical History: needs abdominal hernia repair and spine surgery but states neither will be done unless she stops smoking She has had 3 lumbar spine surgeries. She has lost about 70 pounds with effort in 1-2 years.  Pt Diabetic: Yes, states her last A1C was 6.? Pt smoker: smoker (1.5 ppd down to 1/2 ppd, started smoking at age 42 yrs), is taking Chantix now  Pt meds include: Statin : Yes ASA: Yes Other anticoagulants/antiplatelets: no  Past Medical History  Diagnosis Date  . Diabetes mellitus   . Hypertension   . COPD (chronic obstructive pulmonary disease)   . Depression   . Osteoarthritis   . Ventral hernia     postoperative since 2009  . Peripheral neuropathy     following chemotherapy  . Carotid artery occlusion   . Radiation 05/01/2011-06/11/11    5600 cGy 28 fxs/periaortic  . Anemia   . GERD (gastroesophageal reflux disease)   . Insomnia   . Hyperlipidemia   . OAB (overactive bladder)   . Psoriasis   . DDD (degenerative disc disease), lumbar    . Gout 2015  . TIA (transient ischemic attack) 2015  . Hernia of flank   . Endometrial ca     endometrial ca dx 4/11  . 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 Uterine cancer   . Anxiety   . Cataract     bil cateracts removed  . Stroke     tia    Social History History  Substance Use Topics  . Smoking status: Current Every Day Smoker -- 1.00 packs/day for 40 years    Types: Cigarettes  . Smokeless tobacco: Never Used  . Alcohol Use: No    Family History Family History  Problem Relation Age of Onset  . Stroke Mother   . Irritable bowel syndrome Mother   . Heart attack Father 74    died  . Heart disease Father     Heart Disease before age 103  . Hyperlipidemia Father   . Hypertension Father   . Diabetes Daughter   . Hypertension Daughter   . Irritable bowel syndrome Daughter   . Colon cancer Neg Hx   . Esophageal cancer Neg Hx   . Pancreatic cancer Neg Hx   . Rectal cancer Neg Hx   . Stomach cancer Neg Hx     Surgical History Past Surgical History  Procedure Laterality Date  . Cholecystectomy      biliteral  . Eye surgery Right Aug. 2013    Cataract, lens  implant  . Eye surgery Left Sept. 2013    Cataract, lens implant  . Back surgery  Aug/sept 2014    3 back surgeries  (Aug 2014 & Sept 2014)  . I & d of lumbar fusion /removal of hardware for chronic infection  Jan. 8, 2015  . Abdominal hysterectomy  2009    for ca  . Spine surgery      Allergies  Allergen Reactions  . Sulfa Antibiotics Other (See Comments)    Unsure of reaction  . Ambien [Zolpidem Tartrate]     Per the pt, "it makes me go crazy"  . Amitriptyline Hcl     Gastritis  . Prozac [Fluoxetine Hcl] Anxiety and Other (See Comments)    irritability     Current Outpatient Prescriptions  Medication Sig Dispense Refill  . ALPRAZolam (XANAX) 0.5 MG tablet Take 0.5 mg by mouth at bedtime as needed.      Marland Kitchen aspirin 81 MG tablet Take 81 mg by mouth daily.      Marland Kitchen  atorvastatin (LIPITOR) 10 MG tablet Take 10 mg by mouth daily.    . betamethasone dipropionate (DIPROLENE) 0.05 % ointment Apply topically 2 (two) times daily.    . chlorthalidone (HYGROTON) 25 MG tablet Take 25 mg by mouth every morning.      . Cholecalciferol (VITAMIN D3) 1000 UNITS CAPS Take 1 Units by mouth daily.      . Cyanocobalamin (VITAMIN B-12 CR) 1500 MCG TBCR Take by mouth daily.    Marland Kitchen escitalopram (LEXAPRO) 20 MG tablet Take 20 mg by mouth daily.    Marland Kitchen esomeprazole (NEXIUM) 40 MG capsule Take 40 mg by mouth daily.     . fish oil-omega-3 fatty acids 1000 MG capsule Take 1 g by mouth daily.      . Fluticasone-Salmeterol (ADVAIR) 250-50 MCG/DOSE AEPB Inhale 1 puff into the lungs every 12 (twelve) hours.      . folic acid (FOLVITE) 1 MG tablet Take 1 mg by mouth daily.    Marland Kitchen gabapentin (NEURONTIN) 600 MG tablet Take 600 mg by mouth 2 (two) times daily. Pt may take up to 6 Tabs if needed per day.    Marland Kitchen glucose blood (ACCU-CHEK AVIVA PLUS) test strip 1 each by Other route as needed. Use as instructed     . insulin glargine (LANTUS) 100 UNIT/ML injection Inject 35 Units into the skin daily.     Marland Kitchen losartan (COZAAR) 100 MG tablet Take 100 mg by mouth daily.      . magnesium oxide (MAG-OX) 400 MG tablet Take 400 mg by mouth daily.    . metFORMIN (GLUMETZA) 1000 MG (MOD) 24 hr tablet Take 1,000 mg by mouth 2 (two) times daily with a meal.      . methocarbamol (ROBAXIN) 750 MG tablet Take 750 mg by mouth daily.    . methotrexate (RHEUMATREX) 2.5 MG tablet Take 15 mg by mouth once a week. Caution:Chemotherapy. Protect from light.    . metoprolol (TOPROL-XL) 100 MG 24 hr tablet Take 100 mg by mouth daily after breakfast.      . Multiple Vitamin (MULTIVITAMIN) tablet Take 1 tablet by mouth daily.      . nitroGLYCERIN (NITROSTAT) 0.4 MG SL tablet Place 1 tablet (0.4 mg total) under the tongue every 5 (five) minutes as needed for chest pain. 25 tablet 3  . nortriptyline (PAMELOR) 10 MG capsule Take 10  mg by mouth at bedtime.    . OxyCODONE (OXYCONTIN) 40 mg T12A  12 hr tablet Take 40 mg by mouth every 12 (twelve) hours.    Marland Kitchen oxyCODONE-acetaminophen (PERCOCET) 7.5-325 MG per tablet Take 1 tablet by mouth 4 (four) times daily as needed. 10-325 mg    . potassium chloride (KLOR-CON) 10 MEQ CR tablet Take 10 mEq by mouth daily.      . psyllium (REGULOID) 0.52 G capsule Take 0.52 g by mouth daily.      Marland Kitchen saccharomyces boulardii (FLORASTOR) 250 MG capsule Take 250 mg by mouth daily. PROBIOTIC    . varenicline (CHANTIX) 0.5 MG tablet Take 0.5 mg by mouth 2 (two) times daily.    . VENTOLIN HFA 108 (90 BASE) MCG/ACT inhaler Inhale 2 puffs into the lungs 4 times daily.    . fluconazole (DIFLUCAN) 100 MG tablet Take 100 mg by mouth.    . nitrofurantoin, macrocrystal-monohydrate, (MACROBID) 100 MG capsule Take as directed.    . ondansetron (ZOFRAN) 4 MG tablet Take 4 mg by mouth.     No current facility-administered medications for this visit.    Review of Systems : See HPI for pertinent positives and negatives.  Physical Examination  Filed Vitals:   01/30/15 1429 01/30/15 1434  BP: 125/64 113/65  Pulse: 46 47  Resp:  16  Height:  5' 6.5" (1.689 m)  Weight:  173 lb (78.472 kg)  SpO2:  94%   Body mass index is 27.51 kg/(m^2).  General: WDWN obese female in NAD GAIT: normal Eyes: PERRLA Pulmonary: Non-labored, CTAB, Negative Rales, Negative rhonchi, & Positive wheezing.  Cardiac: regular Rhythm , Negative detected murmur. Pedal pulses are palpable.  VASCULAR EXAM Carotid Bruits Right Left   Negative Negative   Radial pulses are 2+ palpable and equal.     Gastrointestinal: soft, nontender, BS WNL, no r/g, lower abdomen hernia.  Musculoskeletal: Negative muscle atrophy/wasting. M/S 4/5 in upper extremities, 3/5 in right LE 2/5 in left LE,  Extremities without ischemic changes.  Neurologic: A&O X 3; Appropriate Affect ; SENSATION ;normal;  Speech is normal, CN 2-12 intact, Pain and light touch intact in extremities, Motor exam as listed above.   Non-Invasive Vascular Imaging CAROTID DUPLEX 01/30/2015   CEREBROVASCULAR DUPLEX EVALUATION    INDICATION: Carotid disease    PREVIOUS INTERVENTION(S):     DUPLEX EXAM:     RIGHT  LEFT  Peak Systolic Velocities (cm/s) End Diastolic Velocities (cm/s) Plaque LOCATION Peak Systolic Velocities (cm/s) End Diastolic Velocities (cm/s) Plaque  70 17 HT CCA PROXIMAL     63 16  CCA MID     52 13 HT CCA DISTAL     301 49 CP ECA     300 79 CP ICA PROXIMAL     113 22  ICA MID     52 14  ICA DISTAL       5.7 ICA / CCA Ratio (PSV)   Antegrade Vertebral Flow    Brachial Systolic Pressure (mmHg)   Multiphasic (subclavian artery) Brachial Artery Waveforms     Plaque Morphology:  HM = Homogeneous, HT = Heterogeneous, CP = Calcific Plaque, SP = Smooth Plaque, IP = Irregular Plaque     ADDITIONAL FINDINGS: . No significant stenosis of the right common carotid arteries. . Right external carotid artery stenosis noted. . Velocities of the right proximal internal carotid artery may be underestimated due to calcific shadowing.    IMPRESSION: Doppler velocities suggest a high-end 60-79% stenosis of the right proximal internal carotid artery, based on decreased visualization, as described above.  Compared to the previous exam:  No significant change in the right internal carotid artery noted when compared to the previous exam on 07/28/14.       Assessment: Lauren [name] is a 64 y.o. female who presents with asymptomatic high-end 60-79% stenosis of the right proximal internal carotid artery, based on decreased visualization, as described above. No significant change in the right internal carotid artery noted when compared to the previous exam on 07/28/14.  Plan: Follow-up in 6 months  with Carotid Duplex scan. The patient was counseled re smoking cessation and given several free resources re smoking cessation.    I discussed in depth with the patient the nature of atherosclerosis, and emphasized the importance of maximal medical management including strict control of blood pressure, blood glucose, and lipid levels, obtaining regular exercise, and cessation of smoking.  The patient is aware that without maximal medical management the underlying atherosclerotic disease process will progress, limiting the benefit of any interventions. The patient was given information about stroke prevention and what symptoms should prompt the patient to seek immediate medical care. Thank you for allowing [place] to participate in this patient's care.  Clemon Chambers, RN, MSN, FNP-C [facility] Office: [phone]  Clinic Physician: Kellie Simmering  01/30/2015  2:41 PM

## 2015-01-31 ENCOUNTER — Other Ambulatory Visit: Payer: Self-pay | Admitting: *Deleted

## 2015-01-31 DIAGNOSIS — I6523 Occlusion and stenosis of bilateral carotid arteries: Secondary | ICD-10-CM

## 2015-02-15 ENCOUNTER — Other Ambulatory Visit: Payer: Self-pay | Admitting: Family Medicine

## 2015-02-15 DIAGNOSIS — N63 Unspecified lump in unspecified breast: Secondary | ICD-10-CM

## 2015-02-16 ENCOUNTER — Other Ambulatory Visit: Payer: Self-pay | Admitting: Family Medicine

## 2015-02-16 DIAGNOSIS — R911 Solitary pulmonary nodule: Secondary | ICD-10-CM

## 2015-02-23 ENCOUNTER — Other Ambulatory Visit: Payer: Self-pay | Admitting: Family Medicine

## 2015-02-23 ENCOUNTER — Ambulatory Visit
Admission: RE | Admit: 2015-02-23 | Discharge: 2015-02-23 | Disposition: A | Discharge status: Home / Self Care | Payer: Medicare Other | Source: Ambulatory Visit | Attending: Family Medicine | Admitting: Family Medicine

## 2015-02-23 DIAGNOSIS — N6320 Unspecified lump in the left breast, unspecified quadrant: Secondary | ICD-10-CM

## 2015-02-23 DIAGNOSIS — R911 Solitary pulmonary nodule: Secondary | ICD-10-CM

## 2015-02-23 DIAGNOSIS — N63 Unspecified lump in unspecified breast: Secondary | ICD-10-CM

## 2015-02-28 ENCOUNTER — Ambulatory Visit
Admission: RE | Admit: 2015-02-28 | Discharge: 2015-02-28 | Disposition: A | Discharge status: Home / Self Care | Payer: Medicare Other | Source: Ambulatory Visit | Attending: Family Medicine | Admitting: Family Medicine

## 2015-02-28 DIAGNOSIS — N6320 Unspecified lump in the left breast, unspecified quadrant: Secondary | ICD-10-CM

## 2015-04-04 ENCOUNTER — Other Ambulatory Visit (HOSPITAL_COMMUNITY): Payer: Self-pay | Admitting: Family Medicine

## 2015-04-04 ENCOUNTER — Ambulatory Visit (HOSPITAL_COMMUNITY)
Admission: RE | Admit: 2015-04-04 | Discharge: 2015-04-04 | Disposition: A | Discharge status: Home / Self Care | Payer: Medicare Other | Source: Ambulatory Visit | Attending: Vascular Surgery | Admitting: Vascular Surgery

## 2015-04-04 DIAGNOSIS — R609 Edema, unspecified: Secondary | ICD-10-CM

## 2015-04-11 ENCOUNTER — Other Ambulatory Visit: Payer: Self-pay | Admitting: Family Medicine

## 2015-04-11 DIAGNOSIS — M5416 Radiculopathy, lumbar region: Secondary | ICD-10-CM

## 2015-04-25 ENCOUNTER — Ambulatory Visit
Admission: RE | Admit: 2015-04-25 | Discharge: 2015-04-25 | Disposition: A | Discharge status: Home / Self Care | Payer: Medicare Other | Source: Ambulatory Visit | Attending: Family Medicine | Admitting: Family Medicine

## 2015-04-25 DIAGNOSIS — M5416 Radiculopathy, lumbar region: Secondary | ICD-10-CM

## 2015-04-25 MED ORDER — IOHEXOL 180 MG/ML  SOLN
1.0000 mL | Freq: Once | INTRAMUSCULAR | Status: AC | PRN
  Administered 2015-04-25: 1 mL via EPIDURAL

## 2015-04-25 MED ORDER — METHYLPREDNISOLONE ACETATE 40 MG/ML INJ SUSP (RADIOLOG
120.0000 mg | Freq: Once | INTRAMUSCULAR | Status: AC
  Administered 2015-04-25: 120 mg via EPIDURAL

## 2015-04-25 NOTE — Discharge Instructions (Signed)

## 2015-07-26 ENCOUNTER — Other Ambulatory Visit: Payer: Self-pay | Admitting: Orthopaedic Surgery

## 2015-07-26 DIAGNOSIS — M5136 Other intervertebral disc degeneration, lumbar region: Secondary | ICD-10-CM

## 2015-08-06 ENCOUNTER — Other Ambulatory Visit: Payer: Self-pay | Admitting: Orthopaedic Surgery

## 2015-08-06 ENCOUNTER — Encounter: Payer: Self-pay | Admitting: Family

## 2015-08-06 ENCOUNTER — Ambulatory Visit
Admission: RE | Admit: 2015-08-06 | Discharge: 2015-08-06 | Disposition: A | Discharge status: Home / Self Care | Payer: Medicare Other | Source: Ambulatory Visit | Attending: Orthopaedic Surgery | Admitting: Orthopaedic Surgery

## 2015-08-06 DIAGNOSIS — M5136 Other intervertebral disc degeneration, lumbar region: Secondary | ICD-10-CM

## 2015-08-07 ENCOUNTER — Ambulatory Visit (INDEPENDENT_AMBULATORY_CARE_PROVIDER_SITE_OTHER): Payer: Medicare Other | Admitting: Family

## 2015-08-07 ENCOUNTER — Encounter: Payer: Self-pay | Admitting: Family

## 2015-08-07 ENCOUNTER — Other Ambulatory Visit: Payer: Self-pay

## 2015-08-07 ENCOUNTER — Ambulatory Visit (HOSPITAL_COMMUNITY)
Admission: RE | Admit: 2015-08-07 | Discharge: 2015-08-07 | Disposition: A | Discharge status: Home / Self Care | Payer: Medicare Other | Source: Ambulatory Visit | Attending: Family | Admitting: Family

## 2015-08-07 ENCOUNTER — Telehealth: Payer: Self-pay

## 2015-08-07 VITALS — BP 123/53 | HR 68 | Temp 99.5°F | Resp 16 | Ht 66.0 in | Wt 175.0 lb

## 2015-08-07 DIAGNOSIS — I6523 Occlusion and stenosis of bilateral carotid arteries: Secondary | ICD-10-CM | POA: Diagnosis not present

## 2015-08-07 DIAGNOSIS — I6529 Occlusion and stenosis of unspecified carotid artery: Secondary | ICD-10-CM | POA: Diagnosis not present

## 2015-08-07 DIAGNOSIS — G453 Amaurosis fugax: Secondary | ICD-10-CM

## 2015-08-07 DIAGNOSIS — F17200 Nicotine dependence, unspecified, uncomplicated: Secondary | ICD-10-CM

## 2015-08-07 DIAGNOSIS — Z720 Tobacco use: Secondary | ICD-10-CM | POA: Diagnosis not present

## 2015-08-07 NOTE — Progress Notes (Signed)
Established Carotid Lauren [name]   History of Present Illness  Lauren [name] is a 64 y.o. female Lauren [name] that Dr. Kellie Simmering saw in August 2015. At that time Dr. Kellie Simmering recommended waiting 6 months to repeat a carotid duplex exam. If Lauren [name] requires a back surgery prior to that time we should probably proceed with right carotid endarterectomy. Lauren [name] lesion was in the 75-80% range and is asymptomatic. Dr. Kellie Simmering indicated Lauren [name] will need right carotid endarterectomy in the near future. If Lauren [name] should decide Lauren [name] would like to proceed with surgery now Lauren [name] will be back in touch with [place].  Lauren [name] has not had previous carotid artery intervention.  Lauren [name] reports transient intermittent loss of vision in Lauren [name] right eye for the last couple of months, is not worsening. The Lauren [name] denies facial drooping. Lauren [name]. denies hemiplegia. The Lauren [name] denies receptive or expressive aphasia.   Lauren [name] denies non healing wounds in feet/legs.  Lauren [name] reports New Medical or Surgical History: needs abdominal hernia repair and spine surgery but states neither will be done unless Lauren [name] stops smoking Lauren [name] has had 3 lumbar spine surgeries. Lauren [name] has lost about 70 pounds with effort in 1-2 years.  Venous Duplex performed in our non invasive vascular lab on 04/04/15 as requested by Dr. Sandi Mariscal, right leg edema was indication, no DVT found at that time.  Lauren [name] Diabetic: Yes, states Lauren [name] last A1C was 6.? Lauren [name] smoker: smoker (4 cigarettes/day, decreased from 1.5 ppd, started smoking at age 89 yrs), is taking Chantix now  Lauren [name] meds include: Statin : Yes ASA: Yes Other anticoagulants/antiplatelets: no    Past Medical History  Diagnosis Date  . Diabetes mellitus   . Hypertension   . COPD (chronic obstructive pulmonary disease)   . Depression   . Osteoarthritis   . Ventral hernia     postoperative since 2009  . Peripheral neuropathy     following chemotherapy  . Carotid artery occlusion   . Radiation 05/01/2011-06/11/11    5600 cGy 28 fxs/periaortic   . Anemia   . GERD (gastroesophageal reflux disease)   . Insomnia   . Hyperlipidemia   . OAB (overactive bladder)   . Psoriasis   . DDD (degenerative disc disease), lumbar   . Gout 2015  . TIA (transient ischemic attack) 2015  . Hernia of flank   . Endometrial ca     endometrial ca dx 4/11  . 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 Uterine cancer   . Anxiety   . Cataract     bil cateracts removed  . Stroke     tia    Social History History  Substance Use Topics  . Smoking status: Light Tobacco Smoker -- 1.00 packs/day for 40 years    Types: Cigarettes  . Smokeless tobacco: Never Used  . Alcohol Use: No    Family History Family History  Problem Relation Age of Onset  . Stroke Mother   . Irritable bowel syndrome Mother   . Heart attack Father 78    died  . Heart disease Father     Heart Disease before age 8  . Hyperlipidemia Father   . Hypertension Father   . Diabetes Daughter   . Hypertension Daughter   . Irritable bowel syndrome Daughter   . Colon cancer Neg Hx   . Esophageal cancer Neg Hx   . Pancreatic cancer Neg Hx   . Rectal cancer Neg Hx   . Stomach cancer Neg Hx  Surgical History Past Surgical History  Procedure Laterality Date  . Cholecystectomy      biliteral  . Eye surgery Right Aug. 2013    Cataract, lens implant  . Eye surgery Left Sept. 2013    Cataract, lens implant  . Back surgery  Aug/sept 2014    3 back surgeries  (Aug 2014 & Sept 2014)  . I & d of lumbar fusion /removal of hardware for chronic infection  Jan. 8, 2015  . Abdominal hysterectomy  2009    for ca  . Spine surgery      Allergies  Allergen Reactions  . Sulfa Antibiotics Other (See Comments)    Unsure of reaction  . Ambien [Zolpidem Tartrate]     Per the Lauren [name], "it makes me go crazy"  . Amitriptyline Hcl     Gastritis  . Prozac [Fluoxetine Hcl] Anxiety and Other (See Comments)    irritability     Current Outpatient Prescriptions   Medication Sig Dispense Refill  . atorvastatin (LIPITOR) 10 MG tablet Take 10 mg by mouth daily.    Marland Kitchen losartan (COZAAR) 100 MG tablet Take 100 mg by mouth daily.      . metFORMIN (GLUCOPHAGE) 1000 MG tablet 2 (two) times daily.    . metoprolol (TOPROL-XL) 100 MG 24 hr tablet Take 100 mg by mouth daily after breakfast.      . ALPRAZolam (XANAX) 0.5 MG tablet Take 0.5 mg by mouth at bedtime as needed.      Marland Kitchen aspirin 81 MG tablet Take 81 mg by mouth daily.      . betamethasone dipropionate (DIPROLENE) 0.05 % ointment Apply topically 2 (two) times daily.    . chlorthalidone (HYGROTON) 25 MG tablet Take 25 mg by mouth every morning.      . Cholecalciferol (VITAMIN D3) 1000 UNITS CAPS Take 1 Units by mouth daily.      . Cyanocobalamin (VITAMIN B-12 CR) 1500 MCG TBCR Take by mouth daily.    Marland Kitchen escitalopram (LEXAPRO) 20 MG tablet Take 20 mg by mouth daily.    Marland Kitchen esomeprazole (NEXIUM) 40 MG capsule Take 40 mg by mouth daily.     . fish oil-omega-3 fatty acids 1000 MG capsule Take 1 g by mouth daily.      . fluconazole (DIFLUCAN) 100 MG tablet Take 100 mg by mouth.    . Fluticasone-Salmeterol (ADVAIR) 250-50 MCG/DOSE AEPB Inhale 1 puff into the lungs every 12 (twelve) hours.      . folic acid (FOLVITE) 1 MG tablet Take 1 mg by mouth daily.    Marland Kitchen gabapentin (NEURONTIN) 600 MG tablet Take 600 mg by mouth 2 (two) times daily. Lauren [name] may take up to 6 Tabs if needed per day.    Marland Kitchen glucose blood (ACCU-CHEK AVIVA PLUS) test strip 1 each by Other route as needed. Use as instructed     . insulin glargine (LANTUS) 100 UNIT/ML injection Inject 35 Units into the skin daily.     . magnesium oxide (MAG-OX) 400 MG tablet Take 400 mg by mouth daily.    . metFORMIN (GLUMETZA) 1000 MG (MOD) 24 hr tablet Take 1,000 mg by mouth 2 (two) times daily with a meal.      . methocarbamol (ROBAXIN) 750 MG tablet Take 750 mg by mouth daily.    . methotrexate (RHEUMATREX) 2.5 MG tablet Take 15 mg by mouth once a week.  Caution:Chemotherapy. Protect from light.    . Multiple Vitamin (MULTIVITAMIN) tablet Take 1 tablet by mouth  daily.      . nitrofurantoin, macrocrystal-monohydrate, (MACROBID) 100 MG capsule Take as directed.    . nitroGLYCERIN (NITROSTAT) 0.4 MG SL tablet Place 1 tablet (0.4 mg total) under the tongue every 5 (five) minutes as needed for chest pain. 25 tablet 3  . nortriptyline (PAMELOR) 10 MG capsule Take 10 mg by mouth at bedtime.    . ondansetron (ZOFRAN) 4 MG tablet Take 4 mg by mouth.    . OxyCODONE (OXYCONTIN) 40 mg T12A 12 hr tablet Take 40 mg by mouth every 12 (twelve) hours.    Marland Kitchen oxyCODONE-acetaminophen (PERCOCET) 7.5-325 MG per tablet Take 1 tablet by mouth 4 (four) times daily as needed. 10-325 mg    . potassium chloride (KLOR-CON) 10 MEQ CR tablet Take 10 mEq by mouth daily.      . psyllium (REGULOID) 0.52 G capsule Take 0.52 g by mouth daily.      Marland Kitchen saccharomyces boulardii (FLORASTOR) 250 MG capsule Take 250 mg by mouth daily. PROBIOTIC    . varenicline (CHANTIX) 0.5 MG tablet Take 0.5 mg by mouth 2 (two) times daily.    . VENTOLIN HFA 108 (90 BASE) MCG/ACT inhaler Inhale 2 puffs into the lungs 4 times daily.     No current facility-administered medications for this visit.    Review of Systems : See HPI for pertinent positives and negatives.  Physical Examination  Filed Vitals:   08/07/15 1506 08/07/15 1509  BP: 131/58 123/53  Pulse: 69 68  Temp:  99.5 F (37.5 C)  TempSrc:  Oral  Resp:  16  Height:  5\' 6"  (1.676 m)  Weight:  175 lb (79.379 kg)  SpO2:  92%   Body mass index is 28.26 kg/(m^2).  General: WDWN obese female in NAD GAIT: normal Eyes: PERRLA Pulmonary: Non-labored, limited air movement in all fields, scattered rhonchi, no wheezing, occasional moist cough Cardiac: regular Rhythm, no detected murmur. Pedal pulses are faintly palpable.  VASCULAR EXAM Carotid Bruits Right Left   Negative Negative   Radial pulses are 2+ palpable and  equal.     Gastrointestinal: soft, moderate diffuse tenderness to palpation, BS WNL, no r/g, large right upper and lower quadrants abdominal hernia.  Musculoskeletal: Negative muscle atrophy/wasting. M/S 4/5 in upper extremities, 3/5 in right LE 2/5 in left LE, Extremities without ischemic changes.  Neurologic: A&O X 3; Appropriate Affect, Speech is normal, CN 2-12 intact, Pain and light touch intact in extremities, Motor exam as listed above.         Non-Invasive Vascular Imaging CAROTID DUPLEX 08/07/2015   CEREBROVASCULAR DUPLEX EVALUATION    INDICATION: Carotid artery disease    PREVIOUS INTERVENTION(S): None    DUPLEX EXAM: Carotid duplex    RIGHT  LEFT  Peak Systolic Velocities (cm/s) End Diastolic Velocities (cm/s) Plaque LOCATION Peak Systolic Velocities (cm/s) End Diastolic Velocities (cm/s) Plaque  79 22 - CCA PROXIMAL 119 30 -  68 18 - CCA MID 86 25 -  63 15 HT CCA DISTAL 79 19 HT  331 33 HT ECA 134 14 -  391 119 CP ICA PROXIMAL 86 26 HT  143 39 - ICA MID 77 20 -  66 15 - ICA DISTAL 87 27 -    5.7 ICA / CCA Ratio (PSV) 1.0  Antegrade Vertebral Flow Antegrade  413 Brachial Systolic Pressure (mmHg) 244  Triphasic Brachial Artery Waveforms Triphasic    Plaque Morphology:  HM = Homogeneous, HT = Heterogeneous, CP = Calcific Plaque, SP = Smooth Plaque, IP =  Irregular Plaque  ADDITIONAL FINDINGS:     IMPRESSION: 1. 80 - 99% right internal carotid artery stenosis. 2. Less than 40% left carotid artery stenosis. 3. Right external carotid artery stenosis. 4. Technically difficult exam due patients inability to cooperate due to pain    Compared to the previous exam:  Increase in velocity on the right since prior exam      Assessment: KARLETTA MILLAY is a 64 y.o. female who presents with symptomatic 80-99% stenosis of the right internal  carotid artery, ess than 40% left internal carotid artery stenosis. Transient intermittent right eye amaurosis fugax for the last couple of months, is not worsening; no other hemispheric neurological changes. CN 2-12 intact.   Increase in velocity on the right internal, carotid artery since prior exam on 01/30/15.  Dr. Kellie Simmering spoke with Lauren [name] and husband and examined Lauren [name]. Lauren [name] has seen Dr. Orene Desanctis in 2014 for cardiac risk stratification prior to contemplated back surgery; will try to get Lauren [name] in to see him within this week, then plan urgent right CEA next week by Dr. Kellie Simmering, possibly 08/16/15.  Lauren [name] DM remains in reported good control. However, Lauren [name] continues to smok, but has decreased usage and is attempting to quit.  Face to face time with Lauren [name] was 25 minutes. Over 50% of this time was spent on counseling and coordination of care.   Plan:  The Lauren [name] was counseled re smoking cessation and given several free resources re smoking cessation.   I discussed in depth with the Lauren [name] the nature of atherosclerosis, and emphasized the importance of maximal medical management including strict control of blood pressure, blood glucose, and lipid levels, obtaining regular exercise, and cessation of smoking.  The Lauren [name] is aware that without maximal medical management the underlying atherosclerotic disease process will progress, limiting the benefit of any interventions. The Lauren [name] was given information about stroke prevention and what symptoms should prompt the Lauren [name] to seek immediate medical care. Thank you for allowing [place] to participate in this Lauren [name]'s care.  Clemon Chambers, RN, MSN, FNP-C [facility] Office: [phone]  Clinic Physician: Kellie Simmering  08/07/2015 3:19 PM

## 2015-08-07 NOTE — Telephone Encounter (Signed)
Error in opening

## 2015-08-07 NOTE — Patient Instructions (Addendum)
Stroke Prevention Some medical conditions and behaviors are associated with an increased chance of having a stroke. You may prevent a stroke by making healthy choices and managing medical conditions. HOW CAN I REDUCE MY RISK OF HAVING A STROKE?   Stay physically active. Get at least 30 minutes of activity on most or all days.  Do not smoke. It may also be helpful to avoid exposure to secondhand smoke.  Limit alcohol use. Moderate alcohol use is considered to be:  No more than 2 drinks per day for men.  No more than 1 drink per day for nonpregnant women.  Eat healthy foods. This involves:  Eating 5 or more servings of fruits and vegetables a day.  Making dietary changes that address high blood pressure (hypertension), high cholesterol, diabetes, or obesity.  Manage your cholesterol levels.  Making food choices that are high in fiber and low in saturated fat, trans fat, and cholesterol may control cholesterol levels.  Take any prescribed medicines to control cholesterol as directed by your health care provider.  Manage your diabetes.  Controlling your carbohydrate and sugar intake is recommended to manage diabetes.  Take any prescribed medicines to control diabetes as directed by your health care provider.  Control your hypertension.  Making food choices that are low in salt (sodium), saturated fat, trans fat, and cholesterol is recommended to manage hypertension.  Take any prescribed medicines to control hypertension as directed by your health care provider.  Maintain a healthy weight.  Reducing calorie intake and making food choices that are low in sodium, saturated fat, trans fat, and cholesterol are recommended to manage weight.  Stop drug abuse.  Avoid taking birth control pills.  Talk to your health care provider about the risks of taking birth control pills if you are over 35 years old, smoke, get migraines, or have ever had a blood clot.  Get evaluated for sleep  disorders (sleep apnea).  Talk to your health care provider about getting a sleep evaluation if you snore a lot or have excessive sleepiness.  Take medicines only as directed by your health care provider.  For some people, aspirin or blood thinners (anticoagulants) are helpful in reducing the risk of forming abnormal blood clots that can lead to stroke. If you have the irregular heart rhythm of atrial fibrillation, you should be on a blood thinner unless there is a good reason you cannot take them.  Understand all your medicine instructions.  Make sure that other conditions (such as anemia or atherosclerosis) are addressed. SEEK IMMEDIATE MEDICAL CARE IF:   You have sudden weakness or numbness of the face, arm, or leg, especially on one side of the body.  Your face or eyelid droops to one side.  You have sudden confusion.  You have trouble speaking (aphasia) or understanding.  You have sudden trouble seeing in one or both eyes.  You have sudden trouble walking.  You have dizziness.  You have a loss of balance or coordination.  You have a sudden, severe headache with no known cause.  You have new chest pain or an irregular heartbeat. Any of these symptoms may represent a serious problem that is an emergency. Do not wait to see if the symptoms will go away. Get medical help at once. Call your local emergency services (911 in [place] Do not drive yourself to the hospital. Document Released: 01/22/2005 Document Revised: 05/01/2014 Document Reviewed: 06/17/2013 [employer] Patient Information 2015 [employer]. This information is not intended to replace advice given   to you by your health care provider. Make sure you discuss any questions you have with your health care provider.    Smoking Cessation Quitting smoking is important to your health and has many advantages. However, it is not always easy to quit since nicotine is a very addictive drug. Oftentimes, people try 3 times or  more before being able to quit. This document explains the best ways for you to prepare to quit smoking. Quitting takes hard work and a lot of effort, but you can do it. ADVANTAGES OF QUITTING SMOKING  You will live longer, feel better, and live better.  Your body will feel the impact of quitting smoking almost immediately.  Within 20 minutes, blood pressure decreases. Your pulse returns to its normal level.  After 8 hours, carbon monoxide levels in the blood return to normal. Your oxygen level increases.  After 24 hours, the chance of having a heart attack starts to decrease. Your breath, hair, and body stop smelling like smoke.  After 48 hours, damaged nerve endings begin to recover. Your sense of taste and smell improve.  After 72 hours, the body is virtually free of nicotine. Your bronchial tubes relax and breathing becomes easier.  After 2 to 12 weeks, lungs can hold more air. Exercise becomes easier and circulation improves.  The risk of having a heart attack, stroke, cancer, or lung disease is greatly reduced.  After 1 year, the risk of coronary heart disease is cut in half.  After 5 years, the risk of stroke falls to the same as a nonsmoker.  After 10 years, the risk of lung cancer is cut in half and the risk of other cancers decreases significantly.  After 15 years, the risk of coronary heart disease drops, usually to the level of a nonsmoker.  If you are pregnant, quitting smoking will improve your chances of having a healthy baby.  The people you live with, especially any children, will be healthier.  You will have extra money to spend on things other than cigarettes. QUESTIONS TO THINK ABOUT BEFORE ATTEMPTING TO QUIT You may want to talk about your answers with your health care provider.  Why do you want to quit?  If you tried to quit in the past, what helped and what did not?  What will be the most difficult situations for you after you quit? How will you plan to  handle them?  Who can help you through the tough times? Your family? Friends? A health care provider?  What pleasures do you get from smoking? What ways can you still get pleasure if you quit? Here are some questions to ask your health care provider:  How can you help me to be successful at quitting?  What medicine do you think would be best for me and how should I take it?  What should I do if I need more help?  What is smoking withdrawal like? How can I get information on withdrawal? GET READY  Set a quit date.  Change your environment by getting rid of all cigarettes, ashtrays, matches, and lighters in your home, car, or work. Do not let people smoke in your home.  Review your past attempts to quit. Think about what worked and what did not. GET SUPPORT AND ENCOURAGEMENT You have a better chance of being successful if you have help. You can get support in many ways.  Tell your family, friends, and coworkers that you are going to quit and need their support. Ask them   not to smoke around you.  Get individual, group, or telephone counseling and support. Programs are available at local hospitals and health centers. Call your local health department for information about programs in your area.  Spiritual beliefs and practices may help some smokers quit.  Download a "quit meter" on your computer to keep track of quit statistics, such as how long you have gone without smoking, cigarettes not smoked, and money saved.  Get a self-help book about quitting smoking and staying off tobacco. LEARN NEW SKILLS AND BEHAVIORS  Distract yourself from urges to smoke. Talk to someone, go for a walk, or occupy your time with a task.  Change your normal routine. Take a different route to work. Drink tea instead of coffee. Eat breakfast in a different place.  Reduce your stress. Take a hot bath, exercise, or read a book.  Plan something enjoyable to do every day. Reward yourself for not  smoking.  Explore interactive web-based programs that specialize in helping you quit. GET MEDICINE AND USE IT CORRECTLY Medicines can help you stop smoking and decrease the urge to smoke. Combining medicine with the above behavioral methods and support can greatly increase your chances of successfully quitting smoking.  Nicotine replacement therapy helps deliver nicotine to your body without the negative effects and risks of smoking. Nicotine replacement therapy includes nicotine gum, lozenges, inhalers, nasal sprays, and skin patches. Some may be available over-the-counter and others require a prescription.  Antidepressant medicine helps people abstain from smoking, but how this works is unknown. This medicine is available by prescription.  Nicotinic receptor partial agonist medicine simulates the effect of nicotine in your brain. This medicine is available by prescription. Ask your health care provider for advice about which medicines to use and how to use them based on your health history. Your health care provider will tell you what side effects to look out for if you choose to be on a medicine or therapy. Carefully read the information on the package. Do not use any other product containing nicotine while using a nicotine replacement product.  RELAPSE OR DIFFICULT SITUATIONS Most relapses occur within the first 3 months after quitting. Do not be discouraged if you start smoking again. Remember, most people try several times before finally quitting. You may have symptoms of withdrawal because your body is used to nicotine. You may crave cigarettes, be irritable, feel very hungry, cough often, get headaches, or have difficulty concentrating. The withdrawal symptoms are only temporary. They are strongest when you first quit, but they will go away within 10-14 days. To reduce the chances of relapse, try to:  Avoid drinking alcohol. Drinking lowers your chances of successfully quitting.  Reduce the  amount of caffeine you consume. Once you quit smoking, the amount of caffeine in your body increases and can give you symptoms, such as a rapid heartbeat, sweating, and anxiety.  Avoid smokers because they can make you want to smoke.  Do not let weight gain distract you. Many smokers will gain weight when they quit, usually less than 10 pounds. Eat a healthy diet and stay active. You can always lose the weight gained after you quit.  Find ways to improve your mood other than smoking. FOR MORE INFORMATION  www.smokefree.gov  Document Released: 12/09/2001 Document Revised: 05/01/2014 Document Reviewed: 03/25/2012 [employer] Patient Information 2015 [employer]. This information is not intended to replace advice given to you by your health care provider. Make sure you discuss any questions you have with your health   care provider.    Smoking Cessation, Tips for Success If you are ready to quit smoking, congratulations! You have chosen to help yourself be healthier. Cigarettes bring nicotine, tar, carbon monoxide, and other irritants into your body. Your lungs, heart, and blood vessels will be able to work better without these poisons. There are many different ways to quit smoking. Nicotine gum, nicotine patches, a nicotine inhaler, or nicotine nasal spray can help with physical craving. Hypnosis, support groups, and medicines help break the habit of smoking. WHAT THINGS CAN I DO TO MAKE QUITTING EASIER?  Here are some tips to help you quit for good:  Pick a date when you will quit smoking completely. Tell all of your friends and family about your plan to quit on that date.  Do not try to slowly cut down on the number of cigarettes you are smoking. Pick a quit date and quit smoking completely starting on that day.  Throw away all cigarettes.   Clean and remove all ashtrays from your home, work, and car.  On a card, write down your reasons for quitting. Carry the card with you and read it  when you get the urge to smoke.  Cleanse your body of nicotine. Drink enough water and fluids to keep your urine clear or pale yellow. Do this after quitting to flush the nicotine from your body.  Learn to predict your moods. Do not let a bad situation be your excuse to have a cigarette. Some situations in your life might tempt you into wanting a cigarette.  Never have "just one" cigarette. It leads to wanting another and another. Remind yourself of your decision to quit.  Change habits associated with smoking. If you smoked while driving or when feeling stressed, try other activities to replace smoking. Stand up when drinking your coffee. Brush your teeth after eating. Sit in a different chair when you read the paper. Avoid alcohol while trying to quit, and try to drink fewer caffeinated beverages. Alcohol and caffeine may urge you to smoke.  Avoid foods and drinks that can trigger a desire to smoke, such as sugary or spicy foods and alcohol.  Ask people who smoke not to smoke around you.  Have something planned to do right after eating or having a cup of coffee. For example, plan to take a walk or exercise.  Try a relaxation exercise to calm you down and decrease your stress. Remember, you may be tense and nervous for the first 2 weeks after you quit, but this will pass.  Find new activities to keep your hands busy. Play with a pen, coin, or rubber band. Doodle or draw things on paper.  Brush your teeth right after eating. This will help cut down on the craving for the taste of tobacco after meals. You can also try mouthwash.   Use oral substitutes in place of cigarettes. Try using lemon drops, carrots, cinnamon sticks, or chewing gum. Keep them handy so they are available when you have the urge to smoke.  When you have the urge to smoke, try deep breathing.  Designate your home as a nonsmoking area.  If you are a heavy smoker, ask your health care provider about a prescription for  nicotine chewing gum. It can ease your withdrawal from nicotine.  Reward yourself. Set aside the cigarette money you save and buy yourself something nice.  Look for support from others. Join a support group or smoking cessation program. Ask someone at home or at work   to help you with your plan to quit smoking.  Always ask yourself, "Do I need this cigarette or is this just a reflex?" Tell yourself, "Today, I choose not to smoke," or "I do not want to smoke." You are reminding yourself of your decision to quit.  Do not replace cigarette smoking with electronic cigarettes (commonly called e-cigarettes). The safety of e-cigarettes is unknown, and some may contain harmful chemicals.  If you relapse, do not give up! Plan ahead and think about what you will do the next time you get the urge to smoke. HOW WILL I FEEL WHEN I QUIT SMOKING? You may have symptoms of withdrawal because your body is used to nicotine (the addictive substance in cigarettes). You may crave cigarettes, be irritable, feel very hungry, cough often, get headaches, or have difficulty concentrating. The withdrawal symptoms are only temporary. They are strongest when you first quit but will go away within 10-14 days. When withdrawal symptoms occur, stay in control. Think about your reasons for quitting. Remind yourself that these are signs that your body is healing and getting used to being without cigarettes. Remember that withdrawal symptoms are easier to treat than the major diseases that smoking can cause.  Even after the withdrawal is over, expect periodic urges to smoke. However, these cravings are generally short lived and will go away whether you smoke or not. Do not smoke! WHAT RESOURCES ARE AVAILABLE TO HELP ME QUIT SMOKING? Your health care provider can direct you to community resources or hospitals for support, which may include:  Group support.  Education.  Hypnosis.  Therapy. Document Released: 09/12/2004 Document  Revised: 05/01/2014 Document Reviewed: 06/02/2013 [employer] Patient Information 2015 [employer]. This information is not intended to replace advice given to you by your health care provider. Make sure you discuss any questions you have with your health care provider.  

## 2015-08-08 ENCOUNTER — Telehealth: Payer: Self-pay | Admitting: Family

## 2015-08-08 NOTE — Telephone Encounter (Signed)
Gave patient appt info Lexiscan 08/10/15 7:30 am, then see Dr. Orene Desanctis  At 11am on the same day. Pt verbalized understanding. Instructions for lexiscan - NPO 4 hrs, no coffee/decaf x 12 hrs, no cologne/perfume day of scan - kf

## 2015-08-09 ENCOUNTER — Other Ambulatory Visit: Payer: Self-pay | Admitting: Vascular Surgery

## 2015-08-09 DIAGNOSIS — I6523 Occlusion and stenosis of bilateral carotid arteries: Secondary | ICD-10-CM

## 2015-08-09 DIAGNOSIS — G453 Amaurosis fugax: Secondary | ICD-10-CM

## 2015-08-10 ENCOUNTER — Encounter: Payer: Self-pay | Admitting: Cardiovascular Disease

## 2015-08-10 ENCOUNTER — Ambulatory Visit (HOSPITAL_COMMUNITY)
Admission: RE | Admit: 2015-08-10 | Discharge: 2015-08-10 | Disposition: A | Discharge status: Home / Self Care | Payer: Medicare Other | Source: Ambulatory Visit | Attending: Cardiovascular Disease | Admitting: Cardiovascular Disease

## 2015-08-10 ENCOUNTER — Ambulatory Visit (INDEPENDENT_AMBULATORY_CARE_PROVIDER_SITE_OTHER): Payer: Medicare Other | Admitting: Cardiovascular Disease

## 2015-08-10 VITALS — BP 138/68 | HR 68 | Resp 16 | Ht 66.0 in | Wt 171.0 lb

## 2015-08-10 DIAGNOSIS — I779 Disorder of arteries and arterioles, unspecified: Secondary | ICD-10-CM

## 2015-08-10 DIAGNOSIS — E119 Type 2 diabetes mellitus without complications: Secondary | ICD-10-CM | POA: Diagnosis not present

## 2015-08-10 DIAGNOSIS — Z01810 Encounter for preprocedural cardiovascular examination: Secondary | ICD-10-CM

## 2015-08-10 DIAGNOSIS — J449 Chronic obstructive pulmonary disease, unspecified: Secondary | ICD-10-CM | POA: Diagnosis not present

## 2015-08-10 DIAGNOSIS — I10 Essential (primary) hypertension: Secondary | ICD-10-CM

## 2015-08-10 DIAGNOSIS — I739 Peripheral vascular disease, unspecified: Secondary | ICD-10-CM | POA: Diagnosis not present

## 2015-08-10 DIAGNOSIS — Z8249 Family history of ischemic heart disease and other diseases of the circulatory system: Secondary | ICD-10-CM | POA: Diagnosis not present

## 2015-08-10 DIAGNOSIS — Z720 Tobacco use: Secondary | ICD-10-CM

## 2015-08-10 DIAGNOSIS — I6529 Occlusion and stenosis of unspecified carotid artery: Secondary | ICD-10-CM

## 2015-08-10 DIAGNOSIS — I6523 Occlusion and stenosis of bilateral carotid arteries: Secondary | ICD-10-CM | POA: Diagnosis present

## 2015-08-10 DIAGNOSIS — G453 Amaurosis fugax: Secondary | ICD-10-CM | POA: Insufficient documentation

## 2015-08-10 LAB — MYOCARDIAL PERFUSION IMAGING
CHL CUP NUCLEAR SRS: 3
CSEPPHR: 101 {beats}/min
LV dias vol: 109 mL
LV sys vol: 56 mL
Rest HR: 66 {beats}/min
SDS: 3
SSS: 6
TID: 1.16

## 2015-08-10 MED ORDER — TECHNETIUM TC 99M SESTAMIBI GENERIC - CARDIOLITE
29.8000 | Freq: Once | INTRAVENOUS | Status: AC | PRN
  Administered 2015-08-10: 29.8 via INTRAVENOUS

## 2015-08-10 MED ORDER — REGADENOSON 0.4 MG/5ML IV SOLN
0.4000 mg | Freq: Once | INTRAVENOUS | Status: AC
  Administered 2015-08-10: 0.4 mg via INTRAVENOUS

## 2015-08-10 MED ORDER — TECHNETIUM TC 99M SESTAMIBI GENERIC - CARDIOLITE
10.2000 | Freq: Once | INTRAVENOUS | Status: AC | PRN
  Administered 2015-08-10: 10 via INTRAVENOUS

## 2015-08-10 MED ORDER — AMINOPHYLLINE 25 MG/ML IV SOLN
75.0000 mg | Freq: Once | INTRAVENOUS | Status: AC
  Administered 2015-08-10: 75 mg via INTRAVENOUS

## 2015-08-10 NOTE — Patient Instructions (Signed)
A letter of surgical clearance will be sent to your surgeon.  Dr. Sallyanne Kuster recommends that you schedule a follow-up appointment in: One Year.

## 2015-08-10 NOTE — Progress Notes (Signed)
Patient ID: Lauren [name], female   DOB: 10-28-1951, 64 y.o.   MRN: [id number]     Cardiology Office Note   Date:  08/10/2015   ID:  Lauren [name], DOB 1951-09-08, MRN [id number]  PCP:  Marylene Land, MD  Cardiologist:   Sanda Klein, MD   Chief Complaint  Patient presents with  . SURGICAL CLEARANCE    Patient has light headed, dizzy, SOB, and swelling.      History of Present Illness: Lauren [name] is a 64 y.o. female who presents for  Preoperative cardiovascular evaluation prior to right carotid endarterectomy by Dr. Kellie Simmering.  She has 80-99% stenosis of the right internal carotid artery and the plan is to perform this surgery before she would undergo lumbar spine surgery.  She has numerous risk factors for atherosclerotic vascular disease but does not have any known coronary issues. She has a long-standing history of smoking and just quit smoking 2 days ago. Chest previously successfully quit for a year at a time. She has diabetes mellitus and requires insulin therapy. She has treated hypertension and treated hyperlipidemia.  She does not have angina pectoris. She has chronic dyspnea , daily cough infrequently wheezing, consistent with COPD , although I'm not sure that this diagnosis has been formally established.  Lexiscan Myoview performed in 2014 showed breast attenuation artifact normal left ventricular systolic function, no reversible ischemia.  She reports intermittent loss of vision in the right eye over the last couple of months , unclear if this represents amaurosis fugax , no other associated neurological complaints.  She is very sedentary due to chronic back pain , but denies any exertional angina. She has chronic dyspnea with light activity. She denies hemoptysis, abdominal pain, change in bowel pattern, gastrointestinal bleeding, current urinary complaints, lower extremity edema , recent new focal neurological deficits. 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   today she also underwent a repeat [place]. This again shows a very mild and restricted area of perfusion defect in the basal anterior and basal anterolateral segments with normal regional wall motion, most likely breast attenuation artifact. The only abnormalities attack that left ventricular ejection fraction has decreased from 56% to 49%, without a regional pattern.  She has not had echocardiography.  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 In 2012 she underwent radiation therapy for paraspinal tumor recurrence.   Past Medical History  Diagnosis Date  . Diabetes mellitus   . Hypertension   . COPD (chronic obstructive pulmonary disease)   . Depression   . Osteoarthritis   . Ventral hernia     postoperative since 2009  . Peripheral neuropathy     following chemotherapy  . Carotid artery occlusion   . Radiation 05/01/2011-06/11/11    5600 cGy 28 fxs/periaortic  . Anemia   . GERD (gastroesophageal reflux disease)   . Insomnia   . Hyperlipidemia   . OAB (overactive bladder)   . Psoriasis   . DDD (degenerative disc disease), lumbar   . Gout 2015  . TIA (transient ischemic attack) 2015  . Hernia of flank   . Endometrial ca     endometrial ca dx 4/11  . 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 Uterine cancer   . Anxiety   . Cataract     bil cateracts removed  . Stroke     tia    Past Surgical  History  Procedure Laterality Date  . Cholecystectomy      biliteral  . Eye surgery Right Aug. 2013    Cataract, lens implant  . Eye surgery Left Sept. 2013    Cataract, lens implant  . Back surgery  Aug/sept 2014    3 back surgeries  (Aug 2014 & Sept 2014)  . I & d of lumbar fusion /removal of hardware for chronic infection  Jan. 8, 2015  . Abdominal hysterectomy  2009    for ca  . Spine surgery        Current Outpatient Prescriptions  Medication Sig Dispense Refill  . albuterol (PROVENTIL HFA;VENTOLIN HFA) 108 (90 BASE) MCG/ACT inhaler Inhale 2 puffs into the lungs every 6 (six) hours as needed for wheezing or shortness of breath.    . ALPRAZolam (XANAX) 0.5 MG tablet Take 0.5 mg by mouth at bedtime as needed.      Marland Kitchen aspirin 81 MG tablet Take 81 mg by mouth daily.      Marland Kitchen atorvastatin (LIPITOR) 10 MG tablet Take 10 mg by mouth daily.    . betamethasone dipropionate (DIPROLENE) 0.05 % ointment Apply topically 2 (two) times daily.    . chlorthalidone (HYGROTON) 25 MG tablet Take 25 mg by mouth every morning.      . Cholecalciferol (VITAMIN D3) 1000 UNITS CAPS Take 1 Units by mouth daily.      . Cyanocobalamin (VITAMIN B-12 CR) 1500 MCG TBCR Take by mouth daily.    Marland Kitchen escitalopram (LEXAPRO) 20 MG tablet Take 20 mg by mouth daily.    Marland Kitchen esomeprazole (NEXIUM) 40 MG capsule Take 40 mg by mouth daily.     . Fluticasone-Salmeterol (ADVAIR) 250-50 MCG/DOSE AEPB Inhale 1 puff into the lungs every 12 (twelve) hours.      . folic acid (FOLVITE) 1 MG tablet Take 1 mg by mouth daily.    Marland Kitchen gabapentin (NEURONTIN) 600 MG tablet Take 600 mg by mouth 2 (two) times daily. Pt may take up to 6 Tabs if needed per day.    Marland Kitchen glucose blood (ACCU-CHEK AVIVA PLUS) test strip 1 each by Other route as needed. Use as instructed     . insulin glargine (LANTUS) 100 UNIT/ML injection Inject 35 Units into the skin daily.     Marland Kitchen losartan (COZAAR) 100 MG tablet Take 100 mg by mouth daily.      . magnesium oxide (MAG-OX) 400 MG tablet Take 400 mg by mouth daily.    . metFORMIN (GLUMETZA) 1000 MG (MOD) 24 hr tablet Take 1,000 mg by mouth 2 (two) times daily with a meal.      . methocarbamol (ROBAXIN) 750 MG tablet Take 750 mg by mouth daily.    . methotrexate (RHEUMATREX) 2.5 MG tablet Take 15 mg by mouth once a week. Caution:Chemotherapy. Protect from light.    . metoprolol (TOPROL-XL) 100 MG 24 hr tablet Take 50 mg by mouth  daily after breakfast.     . Multiple Vitamin (MULTIVITAMIN) tablet Take 1 tablet by mouth daily.      . nitroGLYCERIN (NITROSTAT) 0.4 MG SL tablet Place 1 tablet (0.4 mg total) under the tongue every 5 (five) minutes as needed for chest pain. 25 tablet 3  . nortriptyline (PAMELOR) 10 MG capsule Take 20-30 mg by mouth at bedtime.     . ondansetron (ZOFRAN) 4 MG tablet Take 4 mg by mouth every 8 (eight) hours as needed for nausea or vomiting.     . OxyCODONE (OXYCONTIN)  40 mg T12A 12 hr tablet Take 60 mg by mouth every 12 (twelve) hours.     Marland Kitchen oxyCODONE-acetaminophen (PERCOCET) 10-325 MG per tablet Take 1 tablet by mouth every 4 (four) hours as needed for pain.    . potassium chloride (KLOR-CON) 10 MEQ CR tablet Take 10 mEq by mouth daily.      Marland Kitchen saccharomyces boulardii (FLORASTOR) 250 MG capsule Take 250 mg by mouth daily. PROBIOTIC    . tamsulosin (FLOMAX) 0.4 MG CAPS capsule Take 0.4 mg by mouth every evening.     No current facility-administered medications for this visit.    Allergies:   Sulfa antibiotics; Ambien; Amitriptyline hcl; and Prozac    Social History:  The patient  reports that she has been smoking Cigarettes.  She has a 40 pack-year smoking history. She has never used smokeless tobacco. She reports that she does not drink alcohol or use illicit drugs.   Family History:  The patient's family history includes Diabetes in her daughter; Heart attack (age of onset: 27) in her father; Heart disease in her father; Hyperlipidemia in her father; Hypertension in her daughter and father; Irritable bowel syndrome in her daughter and mother; Stroke in her mother. There is no history of Colon cancer, Esophageal cancer, Pancreatic cancer, Rectal cancer, or Stomach cancer.    ROS:  Please see the history of present illness.    Otherwise, review of systems positive for none.   All other systems are reviewed and negative.    PHYSICAL EXAM: VS:  BP 138/68 mmHg  Pulse 68  Resp 16  Ht 5\' 6"   (1.676 m)  Wt 171 lb (77.565 kg)  BMI 27.61 kg/m2 , BMI Body mass index is 27.61 kg/(m^2).  General: Alert, oriented x3, no distress Head: no evidence of trauma, PERRL, EOMI, no exophtalmos or lid lag, no myxedema, no xanthelasma; normal ears, nose and oropharynx Neck: normal jugular venous pulsations and no hepatojugular reflux; brisk carotid pulses without delay and  Soft right carotid bruit Chest: clear to auscultation, no signs of consolidation by percussion or palpation, normal fremitus, symmetrical and full respiratory excursions Cardiovascular: normal position and quality of the apical impulse, regular rhythm, normal first and second heart sounds, no  murmurs, rubs or gallops Abdomen: no tenderness or distention, no masses by palpation, no abnormal pulsatility or arterial bruits, normal bowel sounds, no hepatosplenomegaly Extremities: no clubbing, cyanosis or edema; 2+ radial, ulnar and brachial pulses bilaterally; 2+ right femoral, posterior tibial and dorsalis pedis pulses; 2+ left femoral, posterior tibial and dorsalis pedis pulses; no subclavian or femoral bruits Neurological: grossly nonfocal Psych: euthymic mood, full affect   EKG:  EKG is not ordered today. The ekg  During her stress test demonstrates  Sinus rhythm with slightly delayed R-wave progression in the precordial leads and nonspecific T-wave changes   Recent Labs: 2015 HEMOGLOBIN 11, CREATININE 1.0, GLUCOSE 138,  MAGNESIUM 1.2,OTHER ROUTINE CHEMISTRIES NORMAL Hgb A1c and lipid profile not available Lipid Panel No results found for: CHOL, TRIG, HDL, CHOLHDL, VLDL, LDLCALC, LDLDIRECT    Wt Readings from Last 3 Encounters:  08/10/15 171 lb (77.565 kg)  08/10/15 175 lb (79.379 kg)  08/07/15 175 lb (79.379 kg)   .   ASSESSMENT AND PLAN:  Abnormal EKG  she has a history of abnormal repolarization changes, currently not obvious  Essential hypertension, benign Control is fair. She requires multiple agents for  blood pressure control. Beta blockers and RAAS inhibitors are important part of her regimen. Target 130/70s the  presence of diabetes mellitus possible mild renal insufficiency  Hyperlipidemia The most recent lipid profile from January 2014 shows triglycerides 209, LDL cholesterol 77, HDL cholesterol 41, total cholesterol 147 with an unfavorable LDL density pattern B; low levels of large HDL 2 and low levels of ApoA1 as well. She is on appropriate lipid lowering therapy. Do not have a recent repeat of the lipid profile.  Carotid artery disease without cerebral infarction SEVERE RIGHT INTERNAL CAROTID ARTERY STENOSIS, POSSIBLE RECENT AMAUROSIS FUGAX, PLAN FOR SURGERY NEAR FUTURE  DM2 (diabetes mellitus, type 2) Recently, control has been better by her report  DJD (degenerative joint disease) of lumbar spine She has severe L4-L5 spondylosis with narrowing of both the central canal and right foraminal narrowing with marked deterioration in functional status and quality of life. She needs lumbar spine surgical correction.   Preoperative evaluation Since her lumbar spine disease limits functional status,  Clinical assessment of perioperative risk is indeterminate. Her nuclear perfusion data  Is reassuring , low risk, although not completely normal. Since she does not have angina pectoris or coronary reversible ischemia I don't think further invasive evaluation is justified prior to her carotid or back surgery. If we do identify lesions that require revascularization, she will then need antiplatelets therapy that would further delay her surgical procedures.  Mild left ventricular dysfunction  LVEF 49% by current study, 56% on the nuclear study from 2014. No clinical heart failure noted. She is already on treatment with beta blockers and an angiotensin receptor blocker. Would like to confirm the presence of left ventricular dysfunction by echocardiography but this is not urgent. There will be no plan for  immediate change in treatment.  LOW RISK FOR MAJOR CARDIOVASCULAR COMPLICATIONS WITH PLANNED CAROTID SURGERY. METOPROLOL SHOULD BE  CONTINUED WITHOUT INTERRUPTION THROUGHOUT THE PERIOPERATIVE PERIOD. IDEALLY, SHOULD ALSO REMAIN ON ASPIRIN.   Current medicines are reviewed at length with the patient today.  The patient does not have concerns regarding medicines.  The following changes have been made:  no change  Labs/ tests ordered today include:  Orders Placed This Encounter  Procedures  . EKG 12-Lead    Patient Instructions  A letter of surgical clearance will be sent to your surgeon.  Dr. Sallyanne Kuster recommends that you schedule a follow-up appointment in: One Year.       Mikael Spray, MD  08/10/2015 1:04 PM    Sanda Klein, MD, [facility] [phone] office [phone] pager

## 2015-08-14 ENCOUNTER — Other Ambulatory Visit: Payer: Self-pay

## 2015-08-14 ENCOUNTER — Encounter (HOSPITAL_COMMUNITY)
Admission: RE | Admit: 2015-08-14 | Discharge: 2015-08-14 | Disposition: A | Discharge status: Home / Self Care | Payer: Medicare Other | Source: Ambulatory Visit | Attending: Vascular Surgery | Admitting: Vascular Surgery

## 2015-08-14 ENCOUNTER — Encounter (HOSPITAL_COMMUNITY): Payer: Self-pay

## 2015-08-14 HISTORY — DX: Cough: R05

## 2015-08-14 HISTORY — DX: Cough, unspecified: R05.9

## 2015-08-14 LAB — URINALYSIS, ROUTINE W REFLEX MICROSCOPIC
BILIRUBIN URINE: NEGATIVE
GLUCOSE, UA: NEGATIVE mg/dL
HGB URINE DIPSTICK: NEGATIVE
Ketones, ur: NEGATIVE mg/dL
Nitrite: NEGATIVE
PH: 5.5 (ref 5.0–8.0)
Protein, ur: NEGATIVE mg/dL
SPECIFIC GRAVITY, URINE: 1.019 (ref 1.005–1.030)
Urobilinogen, UA: 0.2 mg/dL (ref 0.0–1.0)

## 2015-08-14 LAB — CBC
HCT: 32.6 % — ABNORMAL LOW (ref 36.0–46.0)
Hemoglobin: 10 g/dL — ABNORMAL LOW (ref 12.0–15.0)
MCH: 25.6 pg — ABNORMAL LOW (ref 26.0–34.0)
MCHC: 30.7 g/dL (ref 30.0–36.0)
MCV: 83.6 fL (ref 78.0–100.0)
PLATELETS: 548 10*3/uL — AB (ref 150–400)
RBC: 3.9 MIL/uL (ref 3.87–5.11)
RDW: 19.2 % — AB (ref 11.5–15.5)
WBC: 15.7 10*3/uL — AB (ref 4.0–10.5)

## 2015-08-14 LAB — PROTIME-INR
INR: 1.15 (ref 0.00–1.49)
PROTHROMBIN TIME: 14.9 s (ref 11.6–15.2)

## 2015-08-14 LAB — COMPREHENSIVE METABOLIC PANEL
ALT: 19 U/L (ref 14–54)
AST: 23 U/L (ref 15–41)
Albumin: 2.9 g/dL — ABNORMAL LOW (ref 3.5–5.0)
Alkaline Phosphatase: 58 U/L (ref 38–126)
Anion gap: 11 (ref 5–15)
BUN: 21 mg/dL — AB (ref 6–20)
CHLORIDE: 99 mmol/L — AB (ref 101–111)
CO2: 29 mmol/L (ref 22–32)
Calcium: 9.3 mg/dL (ref 8.9–10.3)
Creatinine, Ser: 1.04 mg/dL — ABNORMAL HIGH (ref 0.44–1.00)
GFR calc Af Amer: >60 mL/min (ref >60)
GFR, EST NON AFRICAN AMERICAN: 56 mL/min — AB (ref >60)
Glucose, Bld: 101 mg/dL — ABNORMAL HIGH (ref 65–99)
POTASSIUM: 4.3 mmol/L (ref 3.5–5.1)
Sodium: 139 mmol/L (ref 135–145)
Total Bilirubin: 0.1 mg/dL — ABNORMAL LOW (ref 0.3–1.2)
Total Protein: 6.3 g/dL — ABNORMAL LOW (ref 6.5–8.1)

## 2015-08-14 LAB — GLUCOSE, CAPILLARY: GLUCOSE-CAPILLARY: 107 mg/dL — AB (ref 65–99)

## 2015-08-14 LAB — APTT: APTT: 31 s (ref 24–37)

## 2015-08-14 LAB — ABO/RH: ABO/RH(D): B POS

## 2015-08-14 LAB — SURGICAL PCR SCREEN
MRSA, PCR: POSITIVE — AB
Staphylococcus aureus: POSITIVE — AB

## 2015-08-14 LAB — PREPARE RBC (CROSSMATCH)

## 2015-08-14 LAB — URINE MICROSCOPIC-ADD ON

## 2015-08-14 MED ORDER — CHLORHEXIDINE GLUCONATE 4 % EX LIQD: 60.0000 mL | Freq: Once | CUTANEOUS | Status: DC

## 2015-08-14 NOTE — Pre-Procedure Instructions (Signed)
Lauren [name]  08/14/2015      [id number] [address], [place] [place] [address] Phone: [phone] Fax: [phone]    Your procedure is scheduled on 08/17/15.  Report to [facility] Admitting at 630 A.M.  Call this number if you have problems the morning of surgery:  [phone]   Remember:  Do not eat food or drink liquids after midnight.  Take these medicines the morning of surgery with A SIP OF WATER --all inhalers,hygroton,lexapro,nexium,,toprol,oxycontin   Do not wear jewelry, make-up or nail polish.  Do not wear lotions, powders, or perfumes.  You may wear deodorant.  Do not shave 48 hours prior to surgery.  Men may shave face and neck.  Do not bring valuables to the hospital.  [facility] is not responsible for any belongings or valuables.  Contacts, dentures or bridgework may not be worn into surgery.  Leave your suitcase in the car.  After surgery it may be brought to your room.  For patients admitted to the hospital, discharge time will be determined by your treatment team.  Patients discharged the day of surgery will not be allowed to drive home.   Name and phone number of your driver:   How to Manage Your Diabetes Before Surgery   Why is it important to control my blood sugar before and after surgery?   Improving blood sugar levels before and after surgery helps healing and can limit problems.  A way of improving blood sugar control is eating a healthy diet by:  - Eating less sugar and carbohydrates  - Increasing activity/exercise  - Talk with your doctor about reaching your blood sugar goals  High blood sugars (greater than 180 mg/dL) can raise your risk of infections and slow down your recovery so you will need to focus on controlling your diabetes during the weeks before surgery.  Make sure that the doctor who takes care of your diabetes knows about your planned surgery including the date and  location.  How do I manage my blood sugars before surgery?   Check your blood sugar at least 4 times a day, 2 days before surgery to make sure that they are not too high or low.   Check your blood sugar the morning of your surgery when you wake up and every 2               hours until you get to the Short-Stay unit.  If your blood sugar is less than 70 mg/dL, you will need to treat for low blood sugar by:  Treat a low blood sugar (less than 70 mg/dL) with 1/2 cup of clear juice (cranberry or apple), 4 glucose tablets, OR glucose gel.  Recheck blood sugar in 15 minutes after treatment (to make sure it is greater than 70 mg/dL).  If blood sugar is not greater than 70 mg/dL on re-check, call [phone] for further instructions.   Report your blood sugar to the Short-Stay nurse when you get to Short-Stay.  References:  [facility], 2007 "How to Manage your Diabetes Before and After Surgery".  What do I do about my diabetes medications?   Do not take oral diabetes medicines (pills) the morning of surgery.  THE NIGHT BEFORE SURGERY, take 28 units of lantus Insulin.    THE MORNING OF SURGERY, take 17 units of lantus  Insulin.    Do not take other diabetes injectables the day  of surgery including Byetta, Victoza, Bydureon, and Trulicity.    If your CBG is greater than 220 mg/dL, you may take 1/2 of your sliding scale (correction) dose of insulin.   For patients with "Insulin Pumps":  Contact your diabetes doctor for specific instructions before surgery.   Decrease basal insulin rates by 20% at midnight the night before surgery.  Note that if your surgery is planned to be longer than 2 hours, your insulin pump will be removed and intravenous (IV) insulin will be started and managed by the nurses and anesthesiologist.  You will be able to restart your insulin pump once you are awake and able to manage it.  Make sure to bring insulin pump supplies to  the hospital with you in case your site needs to be changed.       Special instructions:  No oral diabetic meds  Morning of surgery  Please read over the following fact sheets that you were given.

## 2015-08-14 NOTE — Progress Notes (Signed)
[facility] [place] [place], [place].

## 2015-08-15 LAB — HEMOGLOBIN A1C
Hgb A1c MFr Bld: 7 % — ABNORMAL HIGH (ref 4.8–5.6)
Mean Plasma Glucose: 154 mg/dL

## 2015-08-15 NOTE — Progress Notes (Addendum)
Anesthesia Chart Review: Patient is a 64 year old female scheduled for right CEA on 08/22/15 (moved from 08/17/15) by Dr. Kellie Simmering.  History includes DM2, smoking, COPD, HTN, depression, GERD, HLD, psoriasis, TIA '15, endometrial cancer s/p TAH/BSO '09 s/p chemoradiation with recurrent 2011 s/p radiation to periaortic region and L3-4 disc space, chemo-induced neuropathy, anxiety, OAB, back surgery. PCP is listed as Dr. [name].   She was seen by cardiologist Dr.Croitoru for preoperative clearance. Following a recent stress test, he writes:  "Since her lumbar spine disease limits functional status, Clinical assessment of perioperative risk is indeterminate. Her nuclear perfusion data Is reassuring , low risk, although not completely normal. Since she does not have angina pectoris or coronary reversible ischemia I don't think further invasive evaluation is justified prior to her carotid or back surgery. If we do identify lesions that require revascularization, she will then need antiplatelets therapy that would further delay her surgical procedures....LOW RISK FOR MAJOR CARDIOVASCULAR COMPLICATIONS WITH PLANNED CAROTID SURGERY. METOPROLOL SHOULD BE CONTINUED WITHOUT INTERRUPTION THROUGHOUT THE PERIOPERATIVE PERIOD. IDEALLY, SHOULD ALSO REMAIN ON ASPIRIN." He would also like to confirm presence of LV dysfunction by echo, but felt it was not urgent and would not change immediate treatment recommendations.   08/14/15 EKG: NSR.   08/10/15 Nuclear Stress test:  The left ventricular ejection fraction is mildly decreased (45-54%).  Nuclear stress EF: 49%.  There was no ST segment deviation noted during stress.  Defect 1: There is a small fixed defect of mild severity present in the basal anterior and basal anteroseptal location.  This is a low risk study. Low risk stress nuclear study with mild breast attenuation artifact and mildly depressed global systolic function. A prior study was conducted on  06/16/2013.Compared to the prior study, there are changes.  08/07/15 Carotid duplex: 95-18% RICAS, < 84% LICAS, RECAS.   Preoperative labs noted. WBC 15.7, H/H 10.0/32.6, PLT 548K. UA hazy with moderate leukocytes, negative nitrites. Cr 1.04. A1C 7.0. CBC and UA results routed to [facility] RN Arbie Cookey and Colletta Maryland to review with Dr. Kellie Simmering. Defer additional recommendations, if any, to surgeon.  Further evaluation on the day of surgery. She was recently cleared for surgery by cardiology, so unless acute changes or obvious s/s of infection (has mild leukocytosis) then I would anticipate that she could proceed from an anesthesia standpoint.  George Hugh [facility] Short Stay Center/Anesthesiology Phone [phone] 08/15/2015 9:37 AM

## 2015-08-16 MED ORDER — DEXTROSE 5 % IV SOLN
1.5000 g | INTRAVENOUS | Status: AC
  Administered 2015-08-17: 1.5 g via INTRAVENOUS
  Filled 2015-08-16: qty 1.5

## 2015-08-16 MED ORDER — SODIUM CHLORIDE 0.9 % IV SOLN
INTRAVENOUS | Status: DC
  Administered 2015-08-17 (×2): via INTRAVENOUS

## 2015-08-16 NOTE — Progress Notes (Signed)
Spoke with husband @ 8:13 AM and informed him of new arrival time of 24.  He understands and verifies new arrival time.  DA

## 2015-08-17 ENCOUNTER — Inpatient Hospital Stay (HOSPITAL_COMMUNITY): Payer: Medicare Other | Admitting: Certified Registered"

## 2015-08-17 ENCOUNTER — Inpatient Hospital Stay (HOSPITAL_COMMUNITY): Payer: Medicare Other | Admitting: Vascular Surgery

## 2015-08-17 ENCOUNTER — Encounter (HOSPITAL_COMMUNITY)
Admission: RE | Disposition: A | Discharge status: Home / Self Care | Payer: Self-pay | Source: Ambulatory Visit | Attending: Vascular Surgery

## 2015-08-17 ENCOUNTER — Encounter (HOSPITAL_COMMUNITY): Payer: Self-pay | Admitting: General Practice

## 2015-08-17 ENCOUNTER — Inpatient Hospital Stay (HOSPITAL_COMMUNITY)
Admission: RE | Admit: 2015-08-17 | Discharge: 2015-08-18 | DRG: 038 | Disposition: A | Discharge status: Home / Self Care | Payer: Medicare Other | Source: Ambulatory Visit | Attending: Vascular Surgery | Admitting: Vascular Surgery

## 2015-08-17 DIAGNOSIS — Z8673 Personal history of transient ischemic attack (TIA), and cerebral infarction without residual deficits: Secondary | ICD-10-CM | POA: Diagnosis not present

## 2015-08-17 DIAGNOSIS — G453 Amaurosis fugax: Secondary | ICD-10-CM | POA: Diagnosis present

## 2015-08-17 DIAGNOSIS — D649 Anemia, unspecified: Secondary | ICD-10-CM | POA: Diagnosis present

## 2015-08-17 DIAGNOSIS — F17210 Nicotine dependence, cigarettes, uncomplicated: Secondary | ICD-10-CM | POA: Diagnosis present

## 2015-08-17 DIAGNOSIS — T451X5S Adverse effect of antineoplastic and immunosuppressive drugs, sequela: Secondary | ICD-10-CM | POA: Diagnosis not present

## 2015-08-17 DIAGNOSIS — J449 Chronic obstructive pulmonary disease, unspecified: Secondary | ICD-10-CM | POA: Diagnosis present

## 2015-08-17 DIAGNOSIS — Z01812 Encounter for preprocedural laboratory examination: Secondary | ICD-10-CM | POA: Diagnosis not present

## 2015-08-17 DIAGNOSIS — I10 Essential (primary) hypertension: Secondary | ICD-10-CM | POA: Diagnosis present

## 2015-08-17 DIAGNOSIS — Z8542 Personal history of malignant neoplasm of other parts of uterus: Secondary | ICD-10-CM

## 2015-08-17 DIAGNOSIS — Z7951 Long term (current) use of inhaled steroids: Secondary | ICD-10-CM

## 2015-08-17 DIAGNOSIS — Z7982 Long term (current) use of aspirin: Secondary | ICD-10-CM | POA: Diagnosis not present

## 2015-08-17 DIAGNOSIS — Z79899 Other long term (current) drug therapy: Secondary | ICD-10-CM

## 2015-08-17 DIAGNOSIS — Z794 Long term (current) use of insulin: Secondary | ICD-10-CM

## 2015-08-17 DIAGNOSIS — E119 Type 2 diabetes mellitus without complications: Secondary | ICD-10-CM | POA: Diagnosis present

## 2015-08-17 DIAGNOSIS — E785 Hyperlipidemia, unspecified: Secondary | ICD-10-CM | POA: Diagnosis present

## 2015-08-17 DIAGNOSIS — Z923 Personal history of irradiation: Secondary | ICD-10-CM

## 2015-08-17 DIAGNOSIS — F419 Anxiety disorder, unspecified: Secondary | ICD-10-CM | POA: Diagnosis present

## 2015-08-17 DIAGNOSIS — G620 Drug-induced polyneuropathy: Secondary | ICD-10-CM | POA: Diagnosis present

## 2015-08-17 DIAGNOSIS — K219 Gastro-esophageal reflux disease without esophagitis: Secondary | ICD-10-CM | POA: Diagnosis present

## 2015-08-17 DIAGNOSIS — F329 Major depressive disorder, single episode, unspecified: Secondary | ICD-10-CM | POA: Diagnosis present

## 2015-08-17 DIAGNOSIS — Z01810 Encounter for preprocedural cardiovascular examination: Secondary | ICD-10-CM | POA: Diagnosis not present

## 2015-08-17 DIAGNOSIS — I6529 Occlusion and stenosis of unspecified carotid artery: Secondary | ICD-10-CM | POA: Diagnosis present

## 2015-08-17 DIAGNOSIS — I6521 Occlusion and stenosis of right carotid artery: Principal | ICD-10-CM | POA: Diagnosis present

## 2015-08-17 HISTORY — PX: ENDARTERECTOMY: SHX5162

## 2015-08-17 LAB — CREATININE, SERUM
Creatinine, Ser: 0.83 mg/dL (ref 0.44–1.00)
GFR calc Af Amer: >60 mL/min (ref >60)
GFR calc non Af Amer: >60 mL/min (ref >60)

## 2015-08-17 LAB — GLUCOSE, CAPILLARY
GLUCOSE-CAPILLARY: 94 mg/dL (ref 65–99)
GLUCOSE-CAPILLARY: 84 mg/dL (ref 65–99)
Glucose-Capillary: 77 mg/dL (ref 65–99)
Glucose-Capillary: 76 mg/dL (ref 65–99)
Glucose-Capillary: 93 mg/dL (ref 65–99)
Glucose-Capillary: 93 mg/dL (ref 65–99)

## 2015-08-17 LAB — CBC
HCT: 27.9 % — ABNORMAL LOW (ref 36.0–46.0)
Hemoglobin: 8.6 g/dL — ABNORMAL LOW (ref 12.0–15.0)
MCH: 25.3 pg — ABNORMAL LOW (ref 26.0–34.0)
MCHC: 30.8 g/dL (ref 30.0–36.0)
MCV: 82.1 fL (ref 78.0–100.0)
Platelets: 501 K/uL — ABNORMAL HIGH (ref 150–400)
RBC: 3.4 MIL/uL — ABNORMAL LOW (ref 3.87–5.11)
RDW: 18.8 % — ABNORMAL HIGH (ref 11.5–15.5)
WBC: 9.2 K/uL (ref 4.0–10.5)

## 2015-08-17 SURGERY — ENDARTERECTOMY, CAROTID
Anesthesia: General | Site: Neck | Laterality: Right

## 2015-08-17 MED ORDER — METFORMIN HCL ER 500 MG PO TB24: 1000.0000 mg | ORAL_TABLET | Freq: Two times a day (BID) | ORAL | Status: DC

## 2015-08-17 MED ORDER — PHENYLEPHRINE 40 MCG/ML (10ML) SYRINGE FOR IV PUSH (FOR BLOOD PRESSURE SUPPORT)
PREFILLED_SYRINGE | INTRAVENOUS | Status: AC
  Filled 2015-08-17: qty 10

## 2015-08-17 MED ORDER — OXYCODONE HCL ER 20 MG PO T12A: 60.0000 mg | EXTENDED_RELEASE_TABLET | Freq: Two times a day (BID) | ORAL | Status: DC

## 2015-08-17 MED ORDER — MAGNESIUM SULFATE 2 GM/50ML IV SOLN: 2.0000 g | Freq: Every day | INTRAVENOUS | Status: DC | PRN

## 2015-08-17 MED ORDER — MIDAZOLAM HCL 2 MG/2ML IJ SOLN
1.0000 mg | Freq: Once | INTRAMUSCULAR | Status: AC
  Administered 2015-08-17: 1 mg via INTRAVENOUS

## 2015-08-17 MED ORDER — EPHEDRINE SULFATE 50 MG/ML IJ SOLN
INTRAMUSCULAR | Status: DC | PRN
  Administered 2015-08-17 (×3): 5 mg via INTRAVENOUS

## 2015-08-17 MED ORDER — DEXMEDETOMIDINE HCL IN NACL 200 MCG/50ML IV SOLN
INTRAVENOUS | Status: AC
  Filled 2015-08-17: qty 50

## 2015-08-17 MED ORDER — MORPHINE SULFATE (PF) 2 MG/ML IV SOLN
2.0000 mg | INTRAVENOUS | Status: DC | PRN
  Administered 2015-08-18: 2 mg via INTRAVENOUS
  Filled 2015-08-17: qty 1

## 2015-08-17 MED ORDER — OXYCODONE HCL 5 MG/5ML PO SOLN: 5.0000 mg | Freq: Once | ORAL | Status: DC | PRN

## 2015-08-17 MED ORDER — DEXMEDETOMIDINE HCL IN NACL 200 MCG/50ML IV SOLN
INTRAVENOUS | Status: DC | PRN
  Administered 2015-08-17: .5 ug/kg/h via INTRAVENOUS

## 2015-08-17 MED ORDER — ESCITALOPRAM OXALATE 20 MG PO TABS
20.0000 mg | ORAL_TABLET | Freq: Every day | ORAL | Status: DC
  Filled 2015-08-17: qty 1

## 2015-08-17 MED ORDER — SODIUM CHLORIDE 0.9 % IV SOLN: 500.0000 mL | Freq: Once | INTRAVENOUS | Status: DC | PRN

## 2015-08-17 MED ORDER — OXYCODONE HCL 5 MG PO TABS: 5.0000 mg | ORAL_TABLET | Freq: Once | ORAL | Status: DC | PRN

## 2015-08-17 MED ORDER — HEPARIN SODIUM (PORCINE) 1000 UNIT/ML IJ SOLN
INTRAMUSCULAR | Status: DC | PRN
  Administered 2015-08-17: 6000 [IU] via INTRAVENOUS

## 2015-08-17 MED ORDER — LABETALOL HCL 5 MG/ML IV SOLN: 10.0000 mg | INTRAVENOUS | Status: DC | PRN

## 2015-08-17 MED ORDER — METOPROLOL TARTRATE 1 MG/ML IV SOLN
2.0000 mg | INTRAVENOUS | Status: DC | PRN
Start: 2015-08-17 — End: 2015-08-18

## 2015-08-17 MED ORDER — PROPOFOL 10 MG/ML IV BOLUS
INTRAVENOUS | Status: AC
  Filled 2015-08-17: qty 20

## 2015-08-17 MED ORDER — BOOST / RESOURCE BREEZE PO LIQD
1.0000 | Freq: Three times a day (TID) | ORAL | Status: DC
  Administered 2015-08-17: 1 via ORAL

## 2015-08-17 MED ORDER — 0.9 % SODIUM CHLORIDE (POUR BTL) OPTIME
TOPICAL | Status: DC | PRN
  Administered 2015-08-17: 1000 mL

## 2015-08-17 MED ORDER — GABAPENTIN 600 MG PO TABS
600.0000 mg | ORAL_TABLET | Freq: Two times a day (BID) | ORAL | Status: DC
  Administered 2015-08-17 (×2): 600 mg via ORAL
  Filled 2015-08-17 (×4): qty 1

## 2015-08-17 MED ORDER — ACETAMINOPHEN 325 MG PO TABS: 325.0000 mg | ORAL_TABLET | ORAL | Status: DC | PRN

## 2015-08-17 MED ORDER — DOCUSATE SODIUM 100 MG PO CAPS: 100.0000 mg | ORAL_CAPSULE | Freq: Every day | ORAL | Status: DC

## 2015-08-17 MED ORDER — FENTANYL CITRATE (PF) 100 MCG/2ML IJ SOLN
INTRAMUSCULAR | Status: DC | PRN
  Administered 2015-08-17 (×2): 50 ug via INTRAVENOUS
  Administered 2015-08-17: 150 ug via INTRAVENOUS

## 2015-08-17 MED ORDER — LOSARTAN POTASSIUM 50 MG PO TABS: 100.0000 mg | ORAL_TABLET | Freq: Every day | ORAL | Status: DC

## 2015-08-17 MED ORDER — FENTANYL CITRATE (PF) 100 MCG/2ML IJ SOLN
25.0000 ug | INTRAMUSCULAR | Status: DC | PRN
  Administered 2015-08-17 (×2): 50 ug via INTRAVENOUS

## 2015-08-17 MED ORDER — GUAIFENESIN-DM 100-10 MG/5ML PO SYRP: 15.0000 mL | ORAL_SOLUTION | ORAL | Status: DC | PRN

## 2015-08-17 MED ORDER — POTASSIUM CHLORIDE CRYS ER 20 MEQ PO TBCR: 20.0000 meq | EXTENDED_RELEASE_TABLET | Freq: Every day | ORAL | Status: DC | PRN

## 2015-08-17 MED ORDER — SENNOSIDES-DOCUSATE SODIUM 8.6-50 MG PO TABS
1.0000 | ORAL_TABLET | Freq: Every evening | ORAL | Status: DC | PRN
  Filled 2015-08-17: qty 1

## 2015-08-17 MED ORDER — ONDANSETRON HCL 4 MG/2ML IJ SOLN: 4.0000 mg | Freq: Four times a day (QID) | INTRAMUSCULAR | Status: DC | PRN

## 2015-08-17 MED ORDER — SODIUM CHLORIDE 0.9 % IJ SOLN
INTRAMUSCULAR | Status: AC
  Filled 2015-08-17: qty 20

## 2015-08-17 MED ORDER — MIDAZOLAM HCL 2 MG/2ML IJ SOLN
INTRAMUSCULAR | Status: AC
  Administered 2015-08-17: 1 mg via INTRAVENOUS
  Filled 2015-08-17: qty 2

## 2015-08-17 MED ORDER — LOSARTAN POTASSIUM 50 MG PO TABS
100.0000 mg | ORAL_TABLET | Freq: Every day | ORAL | Status: DC
  Filled 2015-08-17 (×2): qty 2

## 2015-08-17 MED ORDER — PHENYLEPHRINE HCL 10 MG/ML IJ SOLN
INTRAMUSCULAR | Status: DC | PRN
  Administered 2015-08-17 (×3): 80 ug via INTRAVENOUS

## 2015-08-17 MED ORDER — NEOSTIGMINE METHYLSULFATE 10 MG/10ML IV SOLN
INTRAVENOUS | Status: DC | PRN
  Administered 2015-08-17: 4 mg via INTRAVENOUS

## 2015-08-17 MED ORDER — METHOTREXATE 2.5 MG PO TABS: 15.0000 mg | ORAL_TABLET | ORAL | Status: DC

## 2015-08-17 MED ORDER — FENTANYL CITRATE (PF) 100 MCG/2ML IJ SOLN
INTRAMUSCULAR | Status: AC
Start: 2015-08-17 — End: 2015-08-17
  Administered 2015-08-17: 50 ug via INTRAVENOUS
  Filled 2015-08-17: qty 4

## 2015-08-17 MED ORDER — ARTIFICIAL TEARS OP OINT
TOPICAL_OINTMENT | OPHTHALMIC | Status: AC
  Filled 2015-08-17: qty 3.5

## 2015-08-17 MED ORDER — INSULIN ASPART 100 UNIT/ML ~~LOC~~ SOLN
0.0000 [IU] | Freq: Three times a day (TID) | SUBCUTANEOUS | Status: DC
Start: 2015-08-17 — End: 2015-08-18

## 2015-08-17 MED ORDER — GLYCOPYRROLATE 0.2 MG/ML IJ SOLN
INTRAMUSCULAR | Status: AC
  Filled 2015-08-17: qty 1

## 2015-08-17 MED ORDER — LIDOCAINE HCL (CARDIAC) 20 MG/ML IV SOLN
INTRAVENOUS | Status: AC
  Filled 2015-08-17: qty 10

## 2015-08-17 MED ORDER — MIDAZOLAM HCL 2 MG/2ML IJ SOLN
INTRAMUSCULAR | Status: AC
  Filled 2015-08-17: qty 4

## 2015-08-17 MED ORDER — LIDOCAINE HCL (CARDIAC) 20 MG/ML IV SOLN
INTRAVENOUS | Status: DC | PRN
  Administered 2015-08-17: 30 mg via INTRAVENOUS

## 2015-08-17 MED ORDER — ROCURONIUM BROMIDE 50 MG/5ML IV SOLN
INTRAVENOUS | Status: AC
  Filled 2015-08-17: qty 1

## 2015-08-17 MED ORDER — HEPARIN SODIUM (PORCINE) 1000 UNIT/ML IJ SOLN
INTRAMUSCULAR | Status: AC
  Filled 2015-08-17: qty 1

## 2015-08-17 MED ORDER — OXYCODONE-ACETAMINOPHEN 5-325 MG PO TABS
ORAL_TABLET | ORAL | Status: AC
  Filled 2015-08-17: qty 2

## 2015-08-17 MED ORDER — ALBUTEROL SULFATE (2.5 MG/3ML) 0.083% IN NEBU: 2.5000 mg | INHALATION_SOLUTION | Freq: Four times a day (QID) | RESPIRATORY_TRACT | Status: DC | PRN

## 2015-08-17 MED ORDER — ARTIFICIAL TEARS OP OINT
TOPICAL_OINTMENT | OPHTHALMIC | Status: DC | PRN
  Administered 2015-08-17: 1 via OPHTHALMIC

## 2015-08-17 MED ORDER — SACCHAROMYCES BOULARDII 250 MG PO CAPS
250.0000 mg | ORAL_CAPSULE | Freq: Every day | ORAL | Status: DC
  Administered 2015-08-17: 250 mg via ORAL
  Filled 2015-08-17 (×2): qty 1

## 2015-08-17 MED ORDER — INSULIN GLARGINE 100 UNIT/ML ~~LOC~~ SOLN
35.0000 [IU] | SUBCUTANEOUS | Status: DC
  Administered 2015-08-17: 35 [IU] via SUBCUTANEOUS
  Filled 2015-08-17 (×2): qty 0.35

## 2015-08-17 MED ORDER — GLYCOPYRROLATE 0.2 MG/ML IJ SOLN
INTRAMUSCULAR | Status: DC | PRN
  Administered 2015-08-17: 0.2 mg via INTRAVENOUS
  Administered 2015-08-17: 0.6 mg via INTRAVENOUS

## 2015-08-17 MED ORDER — ACETAMINOPHEN 650 MG RE SUPP: 325.0000 mg | RECTAL | Status: DC | PRN

## 2015-08-17 MED ORDER — POTASSIUM CHLORIDE CRYS ER 10 MEQ PO TBCR
10.0000 meq | EXTENDED_RELEASE_TABLET | Freq: Every day | ORAL | Status: DC
  Administered 2015-08-17: 10 meq via ORAL
  Filled 2015-08-17 (×2): qty 1

## 2015-08-17 MED ORDER — PHENYLEPHRINE HCL 10 MG/ML IJ SOLN
10.0000 mg | INTRAVENOUS | Status: DC | PRN
  Administered 2015-08-17: 15 ug/min via INTRAVENOUS

## 2015-08-17 MED ORDER — PHENOL 1.4 % MT LIQD: 1.0000 | OROMUCOSAL | Status: DC | PRN

## 2015-08-17 MED ORDER — STERILE WATER FOR INJECTION IJ SOLN
INTRAMUSCULAR | Status: AC
  Filled 2015-08-17: qty 10

## 2015-08-17 MED ORDER — DEXTROSE 5 % IV SOLN
1.5000 g | Freq: Two times a day (BID) | INTRAVENOUS | Status: DC
  Administered 2015-08-17: 1.5 g via INTRAVENOUS
  Filled 2015-08-17 (×2): qty 1.5

## 2015-08-17 MED ORDER — ASPIRIN EC 81 MG PO TBEC
81.0000 mg | DELAYED_RELEASE_TABLET | Freq: Every day | ORAL | Status: DC
  Administered 2015-08-17: 81 mg via ORAL
  Filled 2015-08-17 (×2): qty 1

## 2015-08-17 MED ORDER — ALBUTEROL SULFATE HFA 108 (90 BASE) MCG/ACT IN AERS: 2.0000 | INHALATION_SPRAY | Freq: Four times a day (QID) | RESPIRATORY_TRACT | Status: DC | PRN

## 2015-08-17 MED ORDER — METHOCARBAMOL 750 MG PO TABS
750.0000 mg | ORAL_TABLET | Freq: Every day | ORAL | Status: DC
  Administered 2015-08-17: 750 mg via ORAL
  Filled 2015-08-17 (×2): qty 1

## 2015-08-17 MED ORDER — MIDAZOLAM HCL 5 MG/5ML IJ SOLN
INTRAMUSCULAR | Status: DC | PRN
  Administered 2015-08-17: 2 mg via INTRAVENOUS

## 2015-08-17 MED ORDER — OXYCODONE-ACETAMINOPHEN 5-325 MG PO TABS
1.0000 | ORAL_TABLET | ORAL | Status: DC | PRN
  Administered 2015-08-17 – 2015-08-18 (×4): 2 via ORAL
  Administered 2015-08-18: 1 via ORAL
  Administered 2015-08-18: 2 via ORAL
  Filled 2015-08-17 (×5): qty 2

## 2015-08-17 MED ORDER — OXYCODONE-ACETAMINOPHEN 10-325 MG PO TABS: 1.0000 | ORAL_TABLET | ORAL | Status: DC | PRN

## 2015-08-17 MED ORDER — LIDOCAINE HCL 4 % MT SOLN
OROMUCOSAL | Status: DC | PRN
  Administered 2015-08-17: 4 mL via TOPICAL

## 2015-08-17 MED ORDER — TAMSULOSIN HCL 0.4 MG PO CAPS
0.4000 mg | ORAL_CAPSULE | Freq: Every evening | ORAL | Status: DC
  Administered 2015-08-17: 0.4 mg via ORAL
  Filled 2015-08-17 (×2): qty 1

## 2015-08-17 MED ORDER — FOLIC ACID 1 MG PO TABS
1.0000 mg | ORAL_TABLET | Freq: Every day | ORAL | Status: DC
  Administered 2015-08-17: 1 mg via ORAL
  Filled 2015-08-17 (×2): qty 1

## 2015-08-17 MED ORDER — DEXMEDETOMIDINE BOLUS VIA INFUSION
INTRAVENOUS | Status: DC | PRN
  Administered 2015-08-17: 24 ug via INTRAVENOUS

## 2015-08-17 MED ORDER — ALPRAZOLAM 0.5 MG PO TABS
0.5000 mg | ORAL_TABLET | Freq: Every evening | ORAL | Status: DC | PRN
  Administered 2015-08-17: 0.5 mg via ORAL
  Filled 2015-08-17: qty 1

## 2015-08-17 MED ORDER — ONDANSETRON HCL 4 MG/2ML IJ SOLN
INTRAMUSCULAR | Status: AC
  Filled 2015-08-17: qty 2

## 2015-08-17 MED ORDER — METFORMIN HCL ER 500 MG PO TB24
1000.0000 mg | ORAL_TABLET | Freq: Two times a day (BID) | ORAL | Status: DC
  Administered 2015-08-17: 1000 mg via ORAL
  Filled 2015-08-17 (×4): qty 2

## 2015-08-17 MED ORDER — NITROGLYCERIN 0.2 MG/ML ON CALL CATH LAB
INTRAVENOUS | Status: DC | PRN
  Administered 2015-08-17 (×3): 80 ug via INTRAVENOUS
  Administered 2015-08-17: 40 ug via INTRAVENOUS

## 2015-08-17 MED ORDER — ONDANSETRON HCL 4 MG PO TABS: 4.0000 mg | ORAL_TABLET | Freq: Three times a day (TID) | ORAL | Status: DC | PRN

## 2015-08-17 MED ORDER — FENTANYL CITRATE (PF) 250 MCG/5ML IJ SOLN
INTRAMUSCULAR | Status: AC
  Filled 2015-08-17: qty 5

## 2015-08-17 MED ORDER — EPHEDRINE SULFATE 50 MG/ML IJ SOLN
INTRAMUSCULAR | Status: AC
  Filled 2015-08-17: qty 1

## 2015-08-17 MED ORDER — PANTOPRAZOLE SODIUM 40 MG PO TBEC
40.0000 mg | DELAYED_RELEASE_TABLET | Freq: Every day | ORAL | Status: DC
  Administered 2015-08-17: 40 mg via ORAL
  Filled 2015-08-17: qty 1

## 2015-08-17 MED ORDER — PROPOFOL 10 MG/ML IV BOLUS
INTRAVENOUS | Status: DC | PRN
  Administered 2015-08-17: 140 mg via INTRAVENOUS

## 2015-08-17 MED ORDER — LIDOCAINE HCL (PF) 1 % IJ SOLN
INTRAMUSCULAR | Status: AC
  Filled 2015-08-17: qty 30

## 2015-08-17 MED ORDER — SODIUM CHLORIDE 0.9 % IR SOLN
Status: DC | PRN
  Administered 2015-08-17: 500 mL

## 2015-08-17 MED ORDER — ALUM & MAG HYDROXIDE-SIMETH 200-200-20 MG/5ML PO SUSP: 15.0000 mL | ORAL | Status: DC | PRN

## 2015-08-17 MED ORDER — MAGNESIUM OXIDE 400 (241.3 MG) MG PO TABS
400.0000 mg | ORAL_TABLET | Freq: Every day | ORAL | Status: DC
  Administered 2015-08-17: 400 mg via ORAL
  Filled 2015-08-17 (×2): qty 1

## 2015-08-17 MED ORDER — ASPIRIN 81 MG PO TABS: 81.0000 mg | ORAL_TABLET | Freq: Every day | ORAL | Status: DC

## 2015-08-17 MED ORDER — NITROGLYCERIN 0.4 MG SL SUBL: 0.4000 mg | SUBLINGUAL_TABLET | SUBLINGUAL | Status: DC | PRN

## 2015-08-17 MED ORDER — ENOXAPARIN SODIUM 40 MG/0.4ML ~~LOC~~ SOLN
40.0000 mg | SUBCUTANEOUS | Status: DC
  Filled 2015-08-17 (×2): qty 0.4

## 2015-08-17 MED ORDER — ROCURONIUM BROMIDE 100 MG/10ML IV SOLN
INTRAVENOUS | Status: DC | PRN
  Administered 2015-08-17: 40 mg via INTRAVENOUS

## 2015-08-17 MED ORDER — LABETALOL HCL 5 MG/ML IV SOLN
INTRAVENOUS | Status: DC | PRN
  Administered 2015-08-17 (×3): 5 mg via INTRAVENOUS

## 2015-08-17 MED ORDER — NORTRIPTYLINE HCL 10 MG PO CAPS
20.0000 mg | ORAL_CAPSULE | Freq: Every day | ORAL | Status: DC
  Administered 2015-08-17: 20 mg via ORAL
  Filled 2015-08-17 (×2): qty 2

## 2015-08-17 MED ORDER — CHLORTHALIDONE 25 MG PO TABS
25.0000 mg | ORAL_TABLET | ORAL | Status: DC
  Filled 2015-08-17 (×2): qty 1

## 2015-08-17 MED ORDER — METOPROLOL SUCCINATE ER 50 MG PO TB24
50.0000 mg | ORAL_TABLET | Freq: Every day | ORAL | Status: DC
  Filled 2015-08-17 (×2): qty 1

## 2015-08-17 MED ORDER — PROTAMINE SULFATE 10 MG/ML IV SOLN
INTRAVENOUS | Status: DC | PRN
  Administered 2015-08-17: 50 mg via INTRAVENOUS

## 2015-08-17 MED ORDER — SODIUM CHLORIDE 0.9 % IV SOLN
INTRAVENOUS | Status: DC
  Administered 2015-08-17 (×2): via INTRAVENOUS

## 2015-08-17 MED ORDER — OXYCODONE HCL ER 40 MG PO T12A
60.0000 mg | EXTENDED_RELEASE_TABLET | Freq: Two times a day (BID) | ORAL | Status: DC
  Administered 2015-08-17 (×2): 60 mg via ORAL
  Filled 2015-08-17 (×4): qty 1

## 2015-08-17 MED ORDER — MAGNESIUM OXIDE 400 MG PO TABS: 400.0000 mg | ORAL_TABLET | Freq: Every day | ORAL | Status: DC

## 2015-08-17 MED ORDER — ATORVASTATIN CALCIUM 10 MG PO TABS
10.0000 mg | ORAL_TABLET | Freq: Every day | ORAL | Status: DC
  Administered 2015-08-17: 10 mg via ORAL
  Filled 2015-08-17 (×2): qty 1

## 2015-08-17 MED ORDER — HYDRALAZINE HCL 20 MG/ML IJ SOLN: 5.0000 mg | INTRAMUSCULAR | Status: DC | PRN

## 2015-08-17 MED ORDER — BISACODYL 10 MG RE SUPP: 10.0000 mg | Freq: Every day | RECTAL | Status: DC | PRN

## 2015-08-17 MED ORDER — ONDANSETRON HCL 4 MG/2ML IJ SOLN: 4.0000 mg | Freq: Once | INTRAMUSCULAR | Status: DC | PRN

## 2015-08-17 SURGICAL SUPPLY — 45 items
CANISTER SUCTION 2500CC (MISCELLANEOUS) ×3 IMPLANT
CATH ROBINSON RED A/P 18FR (CATHETERS) ×3 IMPLANT
CATH SUCT 10FR WHISTLE TIP (CATHETERS) ×3 IMPLANT
CLIP TI MEDIUM 24 (CLIP) ×3 IMPLANT
CLIP TI WIDE RED SMALL 24 (CLIP) ×3 IMPLANT
CRADLE DONUT ADULT HEAD (MISCELLANEOUS) ×3 IMPLANT
DECANTER SPIKE VIAL GLASS SM (MISCELLANEOUS) IMPLANT
DRAIN HEMOVAC 1/8 X 5 (WOUND CARE) IMPLANT
DRSG COVADERM 4X6 (GAUZE/BANDAGES/DRESSINGS) ×2 IMPLANT
DRSG COVADERM 4X8 (GAUZE/BANDAGES/DRESSINGS) IMPLANT
ELECT REM PT RETURN 9FT ADLT (ELECTROSURGICAL) ×3
ELECTRODE REM PT RTRN 9FT ADLT (ELECTROSURGICAL) ×1 IMPLANT
EVACUATOR SILICONE 100CC (DRAIN) IMPLANT
GAUZE SPONGE 4X4 12PLY STRL (GAUZE/BANDAGES/DRESSINGS) ×3 IMPLANT
GLOVE BIO SURGEON STRL SZ 6.5 (GLOVE) ×2 IMPLANT
GLOVE BIO SURGEONS STRL SZ 6.5 (GLOVE) ×2
GLOVE BIOGEL PI IND STRL 7.5 (GLOVE) IMPLANT
GLOVE BIOGEL PI INDICATOR 7.5 (GLOVE) ×2
GLOVE ECLIPSE 7.0 STRL STRAW (GLOVE) ×2 IMPLANT
GLOVE SKINSENSE NS SZ7.0 (GLOVE) ×2
GLOVE SKINSENSE STRL SZ7.0 (GLOVE) IMPLANT
GLOVE SS BIOGEL STRL SZ 7 (GLOVE) ×1 IMPLANT
GLOVE SUPERSENSE BIOGEL SZ 7 (GLOVE) ×2
GOWN STRL REUS W/ TWL LRG LVL3 (GOWN DISPOSABLE) ×3 IMPLANT
GOWN STRL REUS W/TWL LRG LVL3 (GOWN DISPOSABLE) ×9
INSERT FOGARTY SM (MISCELLANEOUS) ×3 IMPLANT
KIT BASIN OR (CUSTOM PROCEDURE TRAY) ×3 IMPLANT
KIT ROOM TURNOVER OR (KITS) ×3 IMPLANT
LIQUID BAND (GAUZE/BANDAGES/DRESSINGS) ×2 IMPLANT
NEEDLE 22X1 1/2 (OR ONLY) (NEEDLE) IMPLANT
NS IRRIG 1000ML POUR BTL (IV SOLUTION) ×6 IMPLANT
PACK CAROTID (CUSTOM PROCEDURE TRAY) ×3 IMPLANT
PAD ARMBOARD 7.5X6 YLW CONV (MISCELLANEOUS) ×6 IMPLANT
PATCH HEMASHIELD 8X75 (Vascular Products) ×2 IMPLANT
SHUNT CAROTID BYPASS 12FRX15.5 (VASCULAR PRODUCTS) IMPLANT
SPONGE INTESTINAL PEANUT (DISPOSABLE) ×3 IMPLANT
SUT PROLENE 6 0 CC (SUTURE) ×3 IMPLANT
SUT SILK 2 0 FS (SUTURE) ×3 IMPLANT
SUT SILK 3 0 TIES 17X18 (SUTURE)
SUT SILK 3-0 18XBRD TIE BLK (SUTURE) IMPLANT
SUT VIC AB 2-0 CT1 27 (SUTURE) ×3
SUT VIC AB 2-0 CT1 TAPERPNT 27 (SUTURE) ×1 IMPLANT
SUT VIC AB 3-0 X1 27 (SUTURE) ×3 IMPLANT
SYR CONTROL 10ML LL (SYRINGE) IMPLANT
WATER STERILE IRR 1000ML POUR (IV SOLUTION) ×3 IMPLANT

## 2015-08-17 NOTE — Anesthesia Procedure Notes (Signed)
Procedure Name: Intubation Date/Time: 08/17/2015 7:44 AM Performed by: Gaylene Brooks Pre-anesthesia Checklist: Patient identified, Timeout performed, Emergency Drugs available, Patient being monitored and Suction available Patient Re-evaluated:Patient Re-evaluated prior to inductionOxygen Delivery Method: Circle system utilized Preoxygenation: Pre-oxygenation with 100% oxygen Intubation Type: IV induction Ventilation: Mask ventilation without difficulty and Oral airway inserted - appropriate to patient size Laryngoscope Size: Sabra Heck and 2 Grade View: Grade I Tube type: Oral Tube size: 7.5 mm Number of attempts: 1 Airway Equipment and Method: Stylet Placement Confirmation: ETT inserted through vocal cords under direct vision,  breath sounds checked- equal and bilateral,  positive ETCO2 and CO2 detector Secured at: 21 cm Tube secured with: Tape Dental Injury: Teeth and Oropharynx as per pre-operative assessment

## 2015-08-17 NOTE — Interval H&P Note (Signed)
History and Physical Interval Note:  08/17/2015 7:25 AM  Lauren [name]  has presented today for surgery, with the diagnosis of Severe Right Internal Carotid Artery Stenosis I65.21    The various methods of treatment have been discussed with the patient and family. After consideration of risks, benefits and other options for treatment, the patient has consented to  Procedure(s): Right Carotid Endarterectomy (Right) as a surgical intervention .  The patient's history has been reviewed, patient examined, no change in status, stable for surgery.  I have reviewed the patient's chart and labs.  Questions were answered to the patient's satisfaction.     Tinnie Gens

## 2015-08-17 NOTE — Progress Notes (Signed)
UR COMPLETED  

## 2015-08-17 NOTE — Anesthesia Preprocedure Evaluation (Addendum)
Anesthesia Evaluation  Patient identified by MRN, date of birth, ID band Patient awake    Reviewed: Allergy & Precautions, NPO status , Patient's Chart, lab work & pertinent test results  Airway Mallampati: II  TM Distance: >3 FB Neck ROM: Full    Dental  (+) Teeth Intact, Dental Advisory Given   Pulmonary Current Smoker,  breath sounds clear to auscultation        Cardiovascular hypertension, Rhythm:Regular Rate:Normal     Neuro/Psych    GI/Hepatic   Endo/Other  diabetes  Renal/GU      Musculoskeletal   Abdominal   Peds  Hematology   Anesthesia Other Findings   Reproductive/Obstetrics                          Anesthesia Physical Anesthesia Plan  ASA: III  Anesthesia Plan: General   Post-op Pain Management:    Induction: Intravenous  Airway Management Planned: Oral ETT  Additional Equipment: Arterial line  Intra-op Plan:   Post-operative Plan: Extubation in OR  Informed Consent: I have reviewed the patients History and Physical, chart, labs and discussed the procedure including the risks, benefits and alternatives for the proposed anesthesia with the patient or authorized representative who has indicated his/her understanding and acceptance.   Dental advisory given  Plan Discussed with: CRNA and Anesthesiologist  Anesthesia Plan Comments: (R. Carotid stenosis Low risk nuclear stress test Smoker/COPD Type 2 DM glucose 77 took lantus 28 U last night Chronic low back pain with narcotic habituation Htn GERD  Plan GA with art line  Roberts Gaudy)       Anesthesia Quick Evaluation

## 2015-08-17 NOTE — Op Note (Signed)
OPERATIVE REPORT  Date of Surgery: 08/17/2015  Surgeon: Tinnie Gens, MD  Assistant: Re: and Theda Sers PA  Pre-op Diagnosis: Severe Right Internal Carotid Artery Stenosis I65.21 with amaurosis fugax right eye   Post-op Diagnosis: severe right carotid internal carotid artery stenosis with amaurosis fugax right eye  Procedure: Procedure(s): Right Carotid Endarterectomy with Dacron patch angioplasty  Anesthesia: General  EBL: 450 cc  Complications: None  Procedure Details: The patient was taken to the operating room and placed in the supine position. Following induction of satisfactory general endotracheal anesthesia the right neck was prepped and draped in a routine sterile manner. Incision was made on the anterior border of the sternocleidomastoid muscle and carried down through the subcutaneous tissue and platysma using the Bovie. Care was taken not to injure the hypoglossal nerve.. The common internal and external carotid arteries were dissected free. There was a calcified atherosclerotic plaque at the carotid bifurcation extending up the internal carotid artery. A #10 shunt was then prepared and the patient was heparinized. The carotid vessels were occluded with vascular clamps. A longitudinal opening was made in the common carotid with a 15 blade extended up the internal carotid with the Potts scissors to a point distal to the disease. The plaque was approximately 90 % stenotic in severity. The distal vessel appeared normal. Shunt was inserted without difficulty reestablishing flow in about 2 minutes. A standard endarterectomy was performed with an eversion endarterectomy of the external carotid. The plaque feathered off  the distal internal carotid artery nicely not requiring any tacking sutures. The lumen was thoroughly irrigated with heparinized saline and loose debris all carefully removed. The arterotomy was then closed with a patch using continuous 6-0 Prolene. Prior to completion of the   Closure the  shunt was removed after approximately 30 minutes of shunt time. Flow was then reestablished up the external branch initially followed by the internal branch. Protamine was given to her reverse the heparin.Following adequate hemostasis the wound was irrigated with saline and closed in layers with Vicryl ain a subcuticular fashion. Sterile dressing was applied and the patient taken to the recovery room in stable condition.  Tinnie Gens, MD 08/17/2015 9:02 AM

## 2015-08-17 NOTE — Progress Notes (Signed)
Notified by CCM that patient appeared to be running a junctional rhythm.  Reviewed EKG and patient appears to be running a junctional rhythm.  Paged Physician.  Updated Dr. Scot Dock on patients status received order for stat EKG.  Will continue to monitor.

## 2015-08-17 NOTE — Progress Notes (Signed)
     Sitting up on side of bed due to back problems No tongue deviation, smile symmetric Incision soft without hematoma  S/P right CEA Stable disposition Plan D/C in am

## 2015-08-17 NOTE — Transfer of Care (Signed)
Immediate Anesthesia Transfer of Care Note  Patient: Lauren [name]  Procedure(s) Performed: Procedure(s): Right Carotid Endartarectomy with patch Angioplasty  (Right)  Patient Location: PACU  Anesthesia Type:General  Level of Consciousness: awake, alert  and oriented  Airway & Oxygen Therapy: Patient Spontanous Breathing and Patient connected to face mask oxygen  Post-op Assessment: Report given to RN, Post -op Vital signs reviewed and stable and Patient moving all extremities X 4  Post vital signs: Reviewed and stable  Last Vitals:  Filed Vitals:   08/17/15 0915  BP:   Pulse:   Temp: 37.3 C  Resp:     Complications: No apparent anesthesia complications

## 2015-08-17 NOTE — Progress Notes (Signed)
Initial Nutrition Assessment  DOCUMENTATION CODES:   Not applicable  INTERVENTION:    Boost Breeze po TID, each supplement provides 250 kcal and 9 grams of protein  NUTRITION DIAGNOSIS:   Inadequate oral intake related to poor appetite as evidenced by per patient/family report.  GOAL:   Patient will meet greater than or equal to 90% of their needs  MONITOR:   PO intake, Supplement acceptance, Diet advancement, Labs, Weight trends  REASON FOR ASSESSMENT:   Malnutrition Screening Tool    ASSESSMENT:   Patient admitted with severe right internal carotid artery stenosis. S/P CEA.  Patient and her daughter report that patient was 220-230 lbs one year ago, this is not consistent with information documented in EMR. One year ago in August 2015, she weighed 182 lbs. 4% weight loss in one year is not significant. Two years ago she weighed ~231 lbs. She reports no appetite at home. Her husband usually prepares breakfast for her and she feeds it to the dog. She could go all day and not eat. She does not like Ensure, Glucerna, or Boost supplements, but has not tried the clear liquid supplements yet. Willing to try Boost Breeze between meals. Nutrition-Focused physical exam completed. Findings are no fat depletion, mild-moderate muscle depletion, and no edema. She reports that she has a 30 lb hernia, that needs to be removed, but she has to have another back surgery first.   Diet Order:  Diet clear liquid Room service appropriate?: Yes; Fluid consistency:: Thin  Skin:  Reviewed, no issues  Last BM:  PTA  Height:   Ht Readings from Last 1 Encounters:  08/17/15 5\' 6"  (1.676 m)    Weight:   Wt Readings from Last 1 Encounters:  08/17/15 174 lb 13.2 oz (79.3 kg)    Ideal Body Weight:  59.1 kg  BMI:  Body mass index is 28.23 kg/(m^2).  Estimated Nutritional Needs:   Kcal:  1700-1900  Protein:  85-100 gm  Fluid:  1.7-1.9 L  EDUCATION NEEDS:   Education needs  addressed  Molli Barrows, [place], [place], [place] Pager [phone] After Hours Pager [phone]

## 2015-08-17 NOTE — Anesthesia Postprocedure Evaluation (Signed)
  Anesthesia Post-op Note  Patient: Lauren [name]  Procedure(s) Performed: Procedure(s): Right Carotid Endartarectomy with patch Angioplasty  (Right)  Patient Location: PACU  Anesthesia Type:General  Level of Consciousness: awake, alert  and oriented  Airway and Oxygen Therapy: Patient Spontanous Breathing and Patient connected to nasal cannula oxygen  Post-op Pain: mild  Post-op Assessment: Post-op Vital signs reviewed, Patient's Cardiovascular Status Stable, Respiratory Function Stable, Patent Airway and Pain level controlled LLE Motor Response: Purposeful movement, Responds to commands LLE Sensation: No numbness RLE Motor Response: Purposeful movement, Responds to commands RLE Sensation: No numbness      Post-op Vital Signs: stable  Last Vitals:  Filed Vitals:   08/17/15 1209  BP:   Pulse: 53  Temp:   Resp: 12    Complications: No apparent anesthesia complications

## 2015-08-17 NOTE — H&P (View-Only) (Signed)
Established Carotid Patient   History of Present Illness  Lauren [name] is a 64 y.o. female patient that Dr. Kellie Simmering saw in August 2015. At that time Dr. Kellie Simmering recommended waiting 6 months to repeat a carotid duplex exam. If she requires a back surgery prior to that time we should probably proceed with right carotid endarterectomy. Her lesion was in the 75-80% range and is asymptomatic. Dr. Kellie Simmering indicated she will need right carotid endarterectomy in the near future. If she should decide she would like to proceed with surgery now she will be back in touch with [place].  Patient has not had previous carotid artery intervention.  Pt reports transient intermittent loss of vision in her right eye for the last couple of months, is not worsening. The patient denies facial drooping. Pt. denies hemiplegia. The patient denies receptive or expressive aphasia.   She denies non healing wounds in feet/legs.  Pt reports New Medical or Surgical History: needs abdominal hernia repair and spine surgery but states neither will be done unless she stops smoking She has had 3 lumbar spine surgeries. She has lost about 70 pounds with effort in 1-2 years.  Venous Duplex performed in our non invasive vascular lab on 04/04/15 as requested by Dr. Sandi Mariscal, right leg edema was indication, no DVT found at that time.  Pt Diabetic: Yes, states her last A1C was 6.? Pt smoker: smoker (4 cigarettes/day, decreased from 1.5 ppd, started smoking at age 61 yrs), is taking Chantix now  Pt meds include: Statin : Yes ASA: Yes Other anticoagulants/antiplatelets: no    Past Medical History  Diagnosis Date  . Diabetes mellitus   . Hypertension   . COPD (chronic obstructive pulmonary disease)   . Depression   . Osteoarthritis   . Ventral hernia     postoperative since 2009  . Peripheral neuropathy     following chemotherapy  . Carotid artery occlusion   . Radiation 05/01/2011-06/11/11    5600 cGy 28 fxs/periaortic   . Anemia   . GERD (gastroesophageal reflux disease)   . Insomnia   . Hyperlipidemia   . OAB (overactive bladder)   . Psoriasis   . DDD (degenerative disc disease), lumbar   . Gout 2015  . TIA (transient ischemic attack) 2015  . Hernia of flank   . Endometrial ca     endometrial ca dx 4/11  . 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 Uterine cancer   . Anxiety   . Cataract     bil cateracts removed  . Stroke     tia    Social History History  Substance Use Topics  . Smoking status: Light Tobacco Smoker -- 1.00 packs/day for 40 years    Types: Cigarettes  . Smokeless tobacco: Never Used  . Alcohol Use: No    Family History Family History  Problem Relation Age of Onset  . Stroke Mother   . Irritable bowel syndrome Mother   . Heart attack Father 24    died  . Heart disease Father     Heart Disease before age 18  . Hyperlipidemia Father   . Hypertension Father   . Diabetes Daughter   . Hypertension Daughter   . Irritable bowel syndrome Daughter   . Colon cancer Neg Hx   . Esophageal cancer Neg Hx   . Pancreatic cancer Neg Hx   . Rectal cancer Neg Hx   . Stomach cancer Neg Hx  Surgical History Past Surgical History  Procedure Laterality Date  . Cholecystectomy      biliteral  . Eye surgery Right Aug. 2013    Cataract, lens implant  . Eye surgery Left Sept. 2013    Cataract, lens implant  . Back surgery  Aug/sept 2014    3 back surgeries  (Aug 2014 & Sept 2014)  . I & d of lumbar fusion /removal of hardware for chronic infection  Jan. 8, 2015  . Abdominal hysterectomy  2009    for ca  . Spine surgery      Allergies  Allergen Reactions  . Sulfa Antibiotics Other (See Comments)    Unsure of reaction  . Ambien [Zolpidem Tartrate]     Per the pt, "it makes me go crazy"  . Amitriptyline Hcl     Gastritis  . Prozac [Fluoxetine Hcl] Anxiety and Other (See Comments)    irritability     Current Outpatient Prescriptions   Medication Sig Dispense Refill  . atorvastatin (LIPITOR) 10 MG tablet Take 10 mg by mouth daily.    Marland Kitchen losartan (COZAAR) 100 MG tablet Take 100 mg by mouth daily.      . metFORMIN (GLUCOPHAGE) 1000 MG tablet 2 (two) times daily.    . metoprolol (TOPROL-XL) 100 MG 24 hr tablet Take 100 mg by mouth daily after breakfast.      . ALPRAZolam (XANAX) 0.5 MG tablet Take 0.5 mg by mouth at bedtime as needed.      Marland Kitchen aspirin 81 MG tablet Take 81 mg by mouth daily.      . betamethasone dipropionate (DIPROLENE) 0.05 % ointment Apply topically 2 (two) times daily.    . chlorthalidone (HYGROTON) 25 MG tablet Take 25 mg by mouth every morning.      . Cholecalciferol (VITAMIN D3) 1000 UNITS CAPS Take 1 Units by mouth daily.      . Cyanocobalamin (VITAMIN B-12 CR) 1500 MCG TBCR Take by mouth daily.    Marland Kitchen escitalopram (LEXAPRO) 20 MG tablet Take 20 mg by mouth daily.    Marland Kitchen esomeprazole (NEXIUM) 40 MG capsule Take 40 mg by mouth daily.     . fish oil-omega-3 fatty acids 1000 MG capsule Take 1 g by mouth daily.      . fluconazole (DIFLUCAN) 100 MG tablet Take 100 mg by mouth.    . Fluticasone-Salmeterol (ADVAIR) 250-50 MCG/DOSE AEPB Inhale 1 puff into the lungs every 12 (twelve) hours.      . folic acid (FOLVITE) 1 MG tablet Take 1 mg by mouth daily.    Marland Kitchen gabapentin (NEURONTIN) 600 MG tablet Take 600 mg by mouth 2 (two) times daily. Pt may take up to 6 Tabs if needed per day.    Marland Kitchen glucose blood (ACCU-CHEK AVIVA PLUS) test strip 1 each by Other route as needed. Use as instructed     . insulin glargine (LANTUS) 100 UNIT/ML injection Inject 35 Units into the skin daily.     . magnesium oxide (MAG-OX) 400 MG tablet Take 400 mg by mouth daily.    . metFORMIN (GLUMETZA) 1000 MG (MOD) 24 hr tablet Take 1,000 mg by mouth 2 (two) times daily with a meal.      . methocarbamol (ROBAXIN) 750 MG tablet Take 750 mg by mouth daily.    . methotrexate (RHEUMATREX) 2.5 MG tablet Take 15 mg by mouth once a week.  Caution:Chemotherapy. Protect from light.    . Multiple Vitamin (MULTIVITAMIN) tablet Take 1 tablet by mouth  daily.      . nitrofurantoin, macrocrystal-monohydrate, (MACROBID) 100 MG capsule Take as directed.    . nitroGLYCERIN (NITROSTAT) 0.4 MG SL tablet Place 1 tablet (0.4 mg total) under the tongue every 5 (five) minutes as needed for chest pain. 25 tablet 3  . nortriptyline (PAMELOR) 10 MG capsule Take 10 mg by mouth at bedtime.    . ondansetron (ZOFRAN) 4 MG tablet Take 4 mg by mouth.    . OxyCODONE (OXYCONTIN) 40 mg T12A 12 hr tablet Take 40 mg by mouth every 12 (twelve) hours.    Marland Kitchen oxyCODONE-acetaminophen (PERCOCET) 7.5-325 MG per tablet Take 1 tablet by mouth 4 (four) times daily as needed. 10-325 mg    . potassium chloride (KLOR-CON) 10 MEQ CR tablet Take 10 mEq by mouth daily.      . psyllium (REGULOID) 0.52 G capsule Take 0.52 g by mouth daily.      Marland Kitchen saccharomyces boulardii (FLORASTOR) 250 MG capsule Take 250 mg by mouth daily. PROBIOTIC    . varenicline (CHANTIX) 0.5 MG tablet Take 0.5 mg by mouth 2 (two) times daily.    . VENTOLIN HFA 108 (90 BASE) MCG/ACT inhaler Inhale 2 puffs into the lungs 4 times daily.     No current facility-administered medications for this visit.    Review of Systems : See HPI for pertinent positives and negatives.  Physical Examination  Filed Vitals:   08/07/15 1506 08/07/15 1509  BP: 131/58 123/53  Pulse: 69 68  Temp:  99.5 F (37.5 C)  TempSrc:  Oral  Resp:  16  Height:  5\' 6"  (1.676 m)  Weight:  175 lb (79.379 kg)  SpO2:  92%   Body mass index is 28.26 kg/(m^2).  General: WDWN obese female in NAD GAIT: normal Eyes: PERRLA Pulmonary: Non-labored, limited air movement in all fields, scattered rhonchi, no wheezing, occasional moist cough Cardiac: regular Rhythm, no detected murmur. Pedal pulses are faintly palpable.  VASCULAR EXAM Carotid Bruits Right Left   Negative Negative   Radial pulses are 2+ palpable and  equal.     Gastrointestinal: soft, moderate diffuse tenderness to palpation, BS WNL, no r/g, large right upper and lower quadrants abdominal hernia.  Musculoskeletal: Negative muscle atrophy/wasting. M/S 4/5 in upper extremities, 3/5 in right LE 2/5 in left LE, Extremities without ischemic changes.  Neurologic: A&O X 3; Appropriate Affect, Speech is normal, CN 2-12 intact, Pain and light touch intact in extremities, Motor exam as listed above.         Non-Invasive Vascular Imaging CAROTID DUPLEX 08/07/2015   CEREBROVASCULAR DUPLEX EVALUATION    INDICATION: Carotid artery disease    PREVIOUS INTERVENTION(S): None    DUPLEX EXAM: Carotid duplex    RIGHT  LEFT  Peak Systolic Velocities (cm/s) End Diastolic Velocities (cm/s) Plaque LOCATION Peak Systolic Velocities (cm/s) End Diastolic Velocities (cm/s) Plaque  79 22 - CCA PROXIMAL 119 30 -  68 18 - CCA MID 86 25 -  63 15 HT CCA DISTAL 79 19 HT  331 33 HT ECA 134 14 -  391 119 CP ICA PROXIMAL 86 26 HT  143 39 - ICA MID 77 20 -  66 15 - ICA DISTAL 87 27 -    5.7 ICA / CCA Ratio (PSV) 1.0  Antegrade Vertebral Flow Antegrade  767 Brachial Systolic Pressure (mmHg) 341  Triphasic Brachial Artery Waveforms Triphasic    Plaque Morphology:  HM = Homogeneous, HT = Heterogeneous, CP = Calcific Plaque, SP = Smooth Plaque, IP =  Irregular Plaque  ADDITIONAL FINDINGS:     IMPRESSION: 1. 80 - 99% right internal carotid artery stenosis. 2. Less than 40% left carotid artery stenosis. 3. Right external carotid artery stenosis. 4. Technically difficult exam due patients inability to cooperate due to pain    Compared to the previous exam:  Increase in velocity on the right since prior exam      Assessment: Lauren [name] is a 64 y.o. female who presents with symptomatic 80-99% stenosis of the right internal  carotid artery, ess than 40% left internal carotid artery stenosis. Transient intermittent right eye amaurosis fugax for the last couple of months, is not worsening; no other hemispheric neurological changes. CN 2-12 intact.   Increase in velocity on the right internal, carotid artery since prior exam on 01/30/15.  Dr. Kellie Simmering spoke with pt and husband and examined pt. She has seen Dr. Orene Desanctis in 2014 for cardiac risk stratification prior to contemplated back surgery; will try to get pt in to see him within this week, then plan urgent right CEA next week by Dr. Kellie Simmering, possibly 08/16/15.  Her DM remains in reported good control. However, she continues to smok, but has decreased usage and is attempting to quit.  Face to face time with patient was 25 minutes. Over 50% of this time was spent on counseling and coordination of care.   Plan:  The patient was counseled re smoking cessation and given several free resources re smoking cessation.   I discussed in depth with the patient the nature of atherosclerosis, and emphasized the importance of maximal medical management including strict control of blood pressure, blood glucose, and lipid levels, obtaining regular exercise, and cessation of smoking.  The patient is aware that without maximal medical management the underlying atherosclerotic disease process will progress, limiting the benefit of any interventions. The patient was given information about stroke prevention and what symptoms should prompt the patient to seek immediate medical care. Thank you for allowing [place] to participate in this patient's care.  Clemon Chambers, RN, MSN, FNP-C [facility] Office: [phone]  Clinic Physician: Kellie Simmering  08/07/2015 3:19 PM

## 2015-08-18 LAB — GLUCOSE, CAPILLARY: GLUCOSE-CAPILLARY: 73 mg/dL (ref 65–99)

## 2015-08-18 LAB — CBC
HEMATOCRIT: 30.1 % — AB (ref 36.0–46.0)
HEMOGLOBIN: 9.2 g/dL — AB (ref 12.0–15.0)
MCH: 25.3 pg — ABNORMAL LOW (ref 26.0–34.0)
MCHC: 30.6 g/dL (ref 30.0–36.0)
MCV: 82.9 fL (ref 78.0–100.0)
Platelets: 577 10*3/uL — ABNORMAL HIGH (ref 150–400)
RBC: 3.63 MIL/uL — ABNORMAL LOW (ref 3.87–5.11)
RDW: 19.1 % — AB (ref 11.5–15.5)
WBC: 13.4 10*3/uL — AB (ref 4.0–10.5)

## 2015-08-18 LAB — BASIC METABOLIC PANEL
ANION GAP: 9 (ref 5–15)
BUN: 14 mg/dL (ref 6–20)
CALCIUM: 8.2 mg/dL — AB (ref 8.9–10.3)
CHLORIDE: 101 mmol/L (ref 101–111)
CO2: 27 mmol/L (ref 22–32)
Creatinine, Ser: 0.93 mg/dL (ref 0.44–1.00)
GFR calc non Af Amer: >60 mL/min (ref >60)
GLUCOSE: 69 mg/dL (ref 65–99)
POTASSIUM: 4 mmol/L (ref 3.5–5.1)
Sodium: 137 mmol/L (ref 135–145)

## 2015-08-18 LAB — HEMOGLOBIN A1C
Hgb A1c MFr Bld: 6.9 % — ABNORMAL HIGH (ref 4.8–5.6)
MEAN PLASMA GLUCOSE: 151 mg/dL

## 2015-08-18 NOTE — Progress Notes (Signed)
Discharge instructions given to patient and patients husband all questions answered at this time.  Pt. VSS with no s/s of distress noted.  Patient stable at discharge.

## 2015-08-18 NOTE — Progress Notes (Addendum)
[facility]  Subjective  - Doing well, she has ambulated, voided, and taking PO's well.   Objective 139/63 85 98.5 F (36.9 C) (Oral) 14 98%  Intake/Output Summary (Last 24 hours) at 08/18/15 0653 Last data filed at 08/17/15 2200  Gross per 24 hour  Intake   1835 ml  Output      0 ml  Net   1835 ml    No tonuge deviation Smile is symmetric Incision soft without hematoma Heart RRR, BP stable] Lungs non labored breathing  Assessment/Planning: POD # 1 Right CEA  Dsischarge home in stable condition No pain medication given at discharge she is on chronic narcotics for lumbar pain.  Laurence Slate [facility] 08/18/2015 6:53 AM --  Laboratory Lab Results:  Recent Labs  08/17/15 1001 08/18/15 0530  WBC 9.2 13.4*  HGB 8.6* 9.2*  HCT 27.9* 30.1*  PLT 501* 577*   BMET  Recent Labs  08/17/15 1001 08/18/15 0530  NA  --  137  K  --  4.0  CL  --  101  CO2  --  27  GLUCOSE  --  69  BUN  --  14  CREATININE 0.83 0.93  CALCIUM  --  8.2*    COAG Lab Results  Component Value Date   INR 1.15 08/14/2015   INR 0.92 04/09/2011   No results found for: PTT

## 2015-08-19 ENCOUNTER — Encounter (HOSPITAL_COMMUNITY): Payer: Self-pay | Admitting: Vascular Surgery

## 2015-08-20 ENCOUNTER — Telehealth: Payer: Self-pay

## 2015-08-20 LAB — TYPE AND SCREEN
ABO/RH(D): B POS
Antibody Screen: NEGATIVE
UNIT DIVISION: 0
Unit division: 0

## 2015-08-20 NOTE — Telephone Encounter (Signed)
Phone call from pt. Reported increased swelling of right side of neck from earlobe to chin.  Stated she noticed the swelling change yesterday.  Reported some difficulty swallowing; stated she is drinking "to stay hydrated", but not eating much.  Stated "I just don't feel like eating."  Denied any difficulty breathing.  Denied fever/ chills.  Stated there is pain in the right neck.  Related hx. of taking Oxycodone for her arthritis in her back, and doesn't feel that it is helping with the pain in her neck incision.  Husband described that the swelling on the medial side of the right neck incision is "approx. the width of 2 fingers put together."  Advised to continue to monitor for any increase in current swelling of right neck incisional area.  Advised if there is an increase in size of neck tonight, to go to the [facility] ER.    Pt. denied any any loss of vision, unilateral weakness, difficulty with speech, or any other neurological change.  Stated "my husband is here, and watches me closely."  Advised nurse will contact pt. in AM to check on symptoms, and discuss with Dr. Kellie Simmering at that time.  Agrees with plan.

## 2015-08-21 ENCOUNTER — Ambulatory Visit (INDEPENDENT_AMBULATORY_CARE_PROVIDER_SITE_OTHER): Payer: Self-pay | Admitting: Vascular Surgery

## 2015-08-21 ENCOUNTER — Encounter: Payer: Self-pay | Admitting: Vascular Surgery

## 2015-08-21 VITALS — BP 164/78 | HR 71 | Temp 98.4°F | Resp 16 | Ht 66.5 in | Wt 167.0 lb

## 2015-08-21 DIAGNOSIS — I6521 Occlusion and stenosis of right carotid artery: Secondary | ICD-10-CM

## 2015-08-21 NOTE — Progress Notes (Signed)
Filed Vitals:   08/21/15 1133 08/21/15 1134 08/21/15 1135  BP: 162/77 165/80 164/78  Pulse: 76 74 71  Temp: 98.4 F (36.9 C)    Resp: 16    Height: 5' 6.5" (1.689 m)    Weight: 167 lb (75.751 kg)    SpO2: 98%

## 2015-08-21 NOTE — Telephone Encounter (Signed)
Called pt. to check on her symptoms this AM.  Spoke with husband.  Reported that the swelling in the right neck is "a little worse than yesterday."  Requested that pt. be evaluated today by Dr. Kellie Simmering.  Discussed with Dr. Kellie Simmering.  Advised to schedule pt. for appt. this AM, to eval. neck.  Notified husband.  Agreed with plan, and stated will bring pt. to office, at this time.

## 2015-08-21 NOTE — Progress Notes (Signed)
Subjective:     Patient ID: Lauren [name], female   DOB: Feb 28, 1951, 64 y.o.   MRN: [id number]  HPI This 64 year old female returns for follow-up regarding a right carotid endarterectomy performed on 08/17/2015 for a severe stenosis which was asymptomatic. Her husband was concerned about some swelling in the anterior aspect of the right neck. She denies any neurologic symptoms such as lateralizing weakness, aphasia, amaurosis fugax, diplopia, blurred vision, or single B. She is taking one aspirin per day.  She states that the pain is not severe.   Review of Systems     Objective:   Physical Exam BP 164/78 mmHg  Pulse 71  Temp(Src) 98.4 F (36.9 C)  Resp 16  Ht 5' 6.5" (1.689 m)  Wt 167 lb (75.751 kg)  BMI 26.55 kg/m2  SpO2 98%   Gen. Well-developed well-nourished female in no apparent distress alert and oriented 3 Right neck has some mild swelling anterior to the incision. Tenderness is appropriate for surgery performed 4 days ago.  Neurologic exam normal     Assessment:      mild swelling right neck post right carotid endarterectomy 4-5 days ago with neurologic exam normal     Plan:      #1 we'll see patient back in follow-up in 3 weeks with carotid duplex exam If this enlarges family will be in touch with [place] Continue daily aspirin

## 2015-08-21 NOTE — Addendum Note (Signed)
Addended by: Dorthula Rue L on: 08/21/2015 01:47 PM   Modules accepted: Orders

## 2015-08-24 NOTE — Discharge Summary (Signed)
[facility] Discharge Summary   Patient ID:  Lauren [name] MRN: [id number] DOB/AGE: [birth year] 64 y.o.  Admit date: 08/17/2015 Discharge date: 08/18/2015 Date of Surgery: 08/17/2015 Surgeon: Surgeon(s): Mal Misty, MD  Admission Diagnosis: Severe Right Internal Carotid Artery Stenosis I65.21    Discharge Diagnoses:  Severe Right Internal Carotid Artery Stenosis I65.21    Secondary Diagnoses: Past Medical History  Diagnosis Date  . Diabetes mellitus   . Hypertension   . COPD (chronic obstructive pulmonary disease)   . Depression   . Osteoarthritis   . Ventral hernia     postoperative since 2009  . Peripheral neuropathy     following chemotherapy  . Carotid artery occlusion   . Radiation 05/01/2011-06/11/11    5600 cGy 28 fxs/periaortic  . Anemia   . GERD (gastroesophageal reflux disease)   . Insomnia   . Hyperlipidemia   . OAB (overactive bladder)   . Psoriasis   . DDD (degenerative disc disease), lumbar   . Gout 2015  . TIA (transient ischemic attack) 2015  . Hernia of flank   . Endometrial ca     endometrial ca dx 4/11  . 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 Uterine cancer   . Anxiety   . Cataract     bil cateracts removed  . Stroke     tia  . Cough       HPI: Lauren [name] is a 64 y.o. female patient that Dr. Kellie Simmering saw in August 2015. At that time Dr. Kellie Simmering recommended waiting 6 months to repeat a carotid duplex exam. If she requires a back surgery prior to that time we should probably proceed with right carotid endarterectomy. Her lesion was in the 75-80% range and is asymptomatic. Dr. Kellie Simmering indicated she will need right carotid endarterectomy in the near future. If she should decide she would like to proceed with surgery now she will be back in touch with US  IMPRESSION: 1. 80 - 99% right internal carotid artery stenosis. 2. Less than 40% left carotid artery stenosis. 3. Right external carotid  artery stenosis. 4. Technically difficult exam due patients inability to cooperate due to pain    Compared to the previous exam:  Increase in velocity on the right since prior exam      Dr. Kellie Simmering spoke with pt and husband and examined pt. She has seen Dr. Orene Desanctis in 2014 for cardiac risk stratification prior to contemplated back surgery; will try to get pt in to see him within this week, then plan urgent right CEA next week by Dr. Daiva Eves Course:  Lauren [name] is a 64 y.o. female is S/P  Procedure(s): Right Carotid Endartarectomy with patch Angioplasty  POD # 1 Right CEA  Dsischarge home in stable condition No pain medication given at discharge she is on chronic narcotics for lumbar pain.    Significant Diagnostic Studies: CBC Lab Results  Component Value Date   WBC 13.4* 08/18/2015   HGB 9.2* 08/18/2015   HCT 30.1* 08/18/2015   MCV 82.9 08/18/2015   PLT 577* 08/18/2015    BMET    Component Value Date/Time   NA 137 08/18/2015 0530   K 4.0 08/18/2015 0530   CL 101 08/18/2015 0530   CO2 27 08/18/2015 0530   GLUCOSE 69 08/18/2015 0530   BUN 14 08/18/2015 0530   BUN 14.7 07/18/2014 1142   CREATININE 0.93 08/18/2015 0530  CREATININE 1.0 07/18/2014 1142   CALCIUM 8.2* 08/18/2015 0530   GFRNONAA >60 08/18/2015 0530   GFRAA >60 08/18/2015 0530   COAG Lab Results  Component Value Date   INR 1.15 08/14/2015   INR 0.92 04/09/2011     Disposition:  Discharge to :Home Discharge Instructions    Call MD for:  redness, tenderness, or signs of infection (pain, swelling, bleeding, redness, odor or green/yellow discharge around incision site)    Complete by:  As directed      Call MD for:  severe or increased pain, loss or decreased feeling  in affected limb(s)    Complete by:  As directed      Call MD for:  temperature >100.5    Complete by:  As directed      Discharge instructions    Complete by:  As directed   You may shower in 24 hours      Discharge patient    Complete by:  As directed   Discharge pt to home     Driving Restrictions    Complete by:  As directed   No driving for 2 weeks     Lifting restrictions    Complete by:  As directed   No lifting for 6 weeks     Resume previous diet    Complete by:  As directed             Medication List    TAKE these medications        ACCU-CHEK AVIVA PLUS test strip  Generic drug:  glucose blood  1 each by Other route as needed. Use as instructed     albuterol 108 (90 BASE) MCG/ACT inhaler  Commonly known as:  PROVENTIL HFA;VENTOLIN HFA  Inhale 2 puffs into the lungs every 6 (six) hours as needed for wheezing or shortness of breath.     ALPRAZolam 0.5 MG tablet  Commonly known as:  XANAX  Take 0.5 mg by mouth at bedtime as needed.     aspirin 81 MG tablet  Take 81 mg by mouth daily.     atorvastatin 10 MG tablet  Commonly known as:  LIPITOR  Take 10 mg by mouth daily.     betamethasone dipropionate 0.05 % ointment  Commonly known as:  DIPROLENE  Apply topically 2 (two) times daily.     chlorthalidone 25 MG tablet  Commonly known as:  HYGROTON  Take 25 mg by mouth every morning.     escitalopram 20 MG tablet  Commonly known as:  LEXAPRO  Take 20 mg by mouth daily.     esomeprazole 40 MG capsule  Commonly known as:  NEXIUM  Take 40 mg by mouth daily.     Fluticasone-Salmeterol 250-50 MCG/DOSE Aepb  Commonly known as:  ADVAIR  Inhale 1 puff into the lungs every 12 (twelve) hours.     folic acid 1 MG tablet  Commonly known as:  FOLVITE  Take 1 mg by mouth daily.     gabapentin 600 MG tablet  Commonly known as:  NEURONTIN  Take 600 mg by mouth 2 (two) times daily. Pt may take up to 6 Tabs if needed per day.     insulin glargine 100 UNIT/ML injection  Commonly known as:  LANTUS  Inject 35 Units into the skin daily.     losartan 100 MG tablet  Commonly known as:  COZAAR  Take 100 mg by mouth daily.     magnesium oxide 400 MG tablet  Commonly  known as:  MAG-OX  Take 400 mg by mouth daily.     metFORMIN 1000 MG (MOD) 24 hr tablet  Commonly known as:  GLUMETZA  Take 1,000 mg by mouth 2 (two) times daily with a meal.     methocarbamol 750 MG tablet  Commonly known as:  ROBAXIN  Take 750 mg by mouth daily.     methotrexate 2.5 MG tablet  Commonly known as:  RHEUMATREX  Take 15 mg by mouth once a week. Caution:Chemotherapy. Protect from light.     metoprolol succinate 100 MG 24 hr tablet  Commonly known as:  TOPROL-XL  Take 50 mg by mouth daily after breakfast.     multivitamin tablet  Take 1 tablet by mouth daily.     nitroGLYCERIN 0.4 MG SL tablet  Commonly known as:  NITROSTAT  Place 1 tablet (0.4 mg total) under the tongue every 5 (five) minutes as needed for chest pain.     nortriptyline 10 MG capsule  Commonly known as:  PAMELOR  Take 20-30 mg by mouth at bedtime.     ondansetron 4 MG tablet  Commonly known as:  ZOFRAN  Take 4 mg by mouth every 8 (eight) hours as needed for nausea or vomiting.     oxyCODONE-acetaminophen 10-325 MG per tablet  Commonly known as:  PERCOCET  Take 1 tablet by mouth every 4 (four) hours as needed for pain.     OXYCONTIN 40 mg T12a 12 hr tablet  Generic drug:  OxyCODONE  Take 60 mg by mouth every 12 (twelve) hours.     potassium chloride 10 MEQ CR tablet  Commonly known as:  KLOR-CON  Take 10 mEq by mouth daily.     saccharomyces boulardii 250 MG capsule  Commonly known as:  FLORASTOR  Take 250 mg by mouth daily. PROBIOTIC     tamsulosin 0.4 MG Caps capsule  Commonly known as:  FLOMAX  Take 0.4 mg by mouth every evening.     Vitamin B-12 CR 1500 MCG Tbcr  Take by mouth daily.     Vitamin D3 1000 UNITS Caps  Take 1 Units by mouth daily.       Verbal and written Discharge instructions given to the patient. Wound care per Discharge AVS     Follow-up Information    Follow up with Tinnie Gens, MD In 2 weeks.   Specialties:  Vascular Surgery, Interventional  Cardiology, Cardiology   Why:  sent message to office   Contact information:   [address] [place] [address] [phone]       Signed: Roxy Horseman 08/24/2015, 2:00 PM  --- For [facility] use --- Instructions: Press F2 to tab through selections.  Delete question if not applicable.   Modified Rankin score at D/C (0-6): Rankin Score=0  IV medication needed for:  1. Hypertension: No 2. Hypotension: No  Post-op Complications: No  1. Post-op CVA or TIA: No  If yes: Event classification (right eye, left eye, right cortical, left cortical, verterobasilar, other):   If yes: Timing of event (intra-op, <6 hrs post-op, >=6 hrs post-op, unknown):   2. CN injury: No  If yes: CN  injuried   3. Myocardial infarction: No  If yes: Dx by (EKG or clinical, Troponin):   4.  CHF: No  5.  Dysrhythmia (new): No  6. Wound infection: No  7. Reperfusion symptoms: No  8. Return to OR: No  If yes: return to OR for (bleeding, neurologic,  other CEA incision, other):   Discharge medications: Statin use:  Yes ASA use:  Yes Beta blocker use:  Yes ACE-Inhibitor use:  No  for medical reason   P2Y12 Antagonist use: [x ] None, [ ]  Plavix, [ ]  Plasugrel, [ ]  Ticlopinine, [ ]  Ticagrelor, [ ]  Other, [ ]  No for medical reason, [ ]  Non-compliant, [ ]  Not-indicated Anti-coagulant use:  [ x] None, [ ]  Warfarin, [ ]  Rivaroxaban, [ ]  Dabigatran, [ ]  Other, [ ]  No for medical reason, [ ]  Non-compliant, [ ]  Not-indicated

## 2015-09-17 ENCOUNTER — Encounter: Payer: Self-pay | Admitting: Vascular Surgery

## 2015-09-17 ENCOUNTER — Ambulatory Visit (HOSPITAL_COMMUNITY)
Admission: RE | Admit: 2015-09-17 | Discharge: 2015-09-17 | Disposition: A | Discharge status: Home / Self Care | Payer: Medicare Other | Source: Ambulatory Visit | Attending: Vascular Surgery | Admitting: Vascular Surgery

## 2015-09-17 DIAGNOSIS — I6522 Occlusion and stenosis of left carotid artery: Secondary | ICD-10-CM | POA: Diagnosis not present

## 2015-09-17 DIAGNOSIS — E119 Type 2 diabetes mellitus without complications: Secondary | ICD-10-CM | POA: Diagnosis not present

## 2015-09-17 DIAGNOSIS — I6521 Occlusion and stenosis of right carotid artery: Secondary | ICD-10-CM | POA: Diagnosis present

## 2015-09-17 DIAGNOSIS — I10 Essential (primary) hypertension: Secondary | ICD-10-CM | POA: Diagnosis not present

## 2015-09-17 DIAGNOSIS — E785 Hyperlipidemia, unspecified: Secondary | ICD-10-CM | POA: Diagnosis not present

## 2015-09-18 ENCOUNTER — Encounter: Payer: Self-pay | Admitting: Vascular Surgery

## 2015-09-18 ENCOUNTER — Ambulatory Visit (INDEPENDENT_AMBULATORY_CARE_PROVIDER_SITE_OTHER): Payer: Medicare Other | Admitting: Vascular Surgery

## 2015-09-18 VITALS — BP 152/67 | HR 63 | Temp 98.9°F | Resp 16 | Ht 66.5 in | Wt 166.0 lb

## 2015-09-18 DIAGNOSIS — I6521 Occlusion and stenosis of right carotid artery: Secondary | ICD-10-CM

## 2015-09-18 NOTE — Progress Notes (Signed)
Filed Vitals:   09/18/15 1517 09/18/15 1520 09/18/15 1521  BP: 150/69 157/69 152/67  Pulse: 69 65 63  Temp: 98.9 F (37.2 C)    Resp: 16    Height: 5' 6.5" (1.689 m)    Weight: 166 lb (75.297 kg)    SpO2: 95%

## 2015-09-18 NOTE — Progress Notes (Signed)
Subjective:     Patient ID: Lauren [name], female   DOB: December 29, 1951, 64 y.o.   MRN: [id number]  HPI  This 64 year old female returns for expose right carotid endarterectomy.Marland Kitchen She was concerned about some swelling in the anterior neck and I saw her a few days following discharge. This has now resolved and the patient has no complaints. She denies lateralizing weakness, aphasia, amaurosis fugax, diplopia, blurred vision, or syncope. She is swallowing well and has no hoarseness. She taking 1 aspirin 81 mg per day.  Review of Systems     Objective:   Physical Exam BP 152/67 mmHg  Pulse 63  Temp(Src) 98.9 F (37.2 C)  Resp 16  Ht 5' 6.5" (1.689 m)  Wt 166 lb (75.297 kg)  BMI 26.39 kg/m2  SpO2 95%   Gen. Well-developed well-nourished female in no apparent distress alert and oriented 3 Right neck incision has healed nicely. 3+ carotid pulse with no audible bruit.  Neurologic exam normal     Assessment:      4 weeks post right carotid endarterectomy for asymptomatic severe right ICA stenosis-doing well     Plan:      return in 6 months with follow-up carotid duplex exam and see me  Continue daily aspirin 81 mg We'll be in touch with [place] if she develops any new neurologic symptoms

## 2015-09-19 NOTE — Addendum Note (Signed)
Addended by: Dorthula Rue L on: 09/19/2015 11:23 AM   Modules accepted: Orders

## 2015-10-10 DIAGNOSIS — Z22322 Carrier or suspected carrier of Methicillin resistant Staphylococcus aureus: Secondary | ICD-10-CM | POA: Insufficient documentation

## 2015-10-11 ENCOUNTER — Other Ambulatory Visit: Payer: Self-pay | Admitting: Infectious Diseases

## 2015-10-11 DIAGNOSIS — N390 Urinary tract infection, site not specified: Secondary | ICD-10-CM

## 2015-10-15 ENCOUNTER — Ambulatory Visit
Admission: RE | Admit: 2015-10-15 | Discharge: 2015-10-15 | Disposition: A | Discharge status: Home / Self Care | Payer: Medicare Other | Source: Ambulatory Visit | Attending: Infectious Diseases | Admitting: Infectious Diseases

## 2015-10-15 DIAGNOSIS — N390 Urinary tract infection, site not specified: Secondary | ICD-10-CM

## 2016-01-25 ENCOUNTER — Other Ambulatory Visit: Payer: Self-pay | Admitting: Family Medicine

## 2016-01-25 ENCOUNTER — Ambulatory Visit
Admission: RE | Admit: 2016-01-25 | Discharge: 2016-01-25 | Disposition: A | Discharge status: Home / Self Care | Payer: Medicare Other | Source: Ambulatory Visit | Attending: Family Medicine | Admitting: Family Medicine

## 2016-01-25 DIAGNOSIS — T1490XA Injury, unspecified, initial encounter: Secondary | ICD-10-CM

## 2016-01-25 DIAGNOSIS — W19XXXA Unspecified fall, initial encounter: Secondary | ICD-10-CM

## 2016-02-14 ENCOUNTER — Other Ambulatory Visit: Payer: Self-pay

## 2016-02-14 DIAGNOSIS — Z1231 Encounter for screening mammogram for malignant neoplasm of breast: Secondary | ICD-10-CM

## 2016-02-29 ENCOUNTER — Ambulatory Visit
Admission: RE | Admit: 2016-02-29 | Discharge: 2016-02-29 | Disposition: A | Discharge status: Home / Self Care | Payer: Medicare Other | Source: Ambulatory Visit

## 2016-02-29 DIAGNOSIS — Z1231 Encounter for screening mammogram for malignant neoplasm of breast: Secondary | ICD-10-CM

## 2016-03-06 ENCOUNTER — Ambulatory Visit: Payer: Self-pay | Admitting: Gynecology

## 2016-03-12 ENCOUNTER — Encounter: Payer: Self-pay | Admitting: Vascular Surgery

## 2016-03-18 ENCOUNTER — Ambulatory Visit: Payer: Medicare Other | Admitting: Vascular Surgery

## 2016-03-18 ENCOUNTER — Encounter (HOSPITAL_COMMUNITY): Payer: Medicare Other

## 2016-03-25 ENCOUNTER — Ambulatory Visit: Payer: Self-pay | Admitting: Gynecology

## 2016-04-04 ENCOUNTER — Other Ambulatory Visit: Payer: Self-pay | Admitting: Orthopaedic Surgery

## 2016-04-04 DIAGNOSIS — M960 Pseudarthrosis after fusion or arthrodesis: Secondary | ICD-10-CM

## 2016-04-04 DIAGNOSIS — G894 Chronic pain syndrome: Secondary | ICD-10-CM

## 2016-04-04 DIAGNOSIS — Q762 Congenital spondylolisthesis: Secondary | ICD-10-CM

## 2016-04-15 ENCOUNTER — Ambulatory Visit
Admission: RE | Admit: 2016-04-15 | Discharge: 2016-04-15 | Disposition: A | Discharge status: Home / Self Care | Payer: Medicare Other | Source: Ambulatory Visit | Attending: Orthopaedic Surgery | Admitting: Orthopaedic Surgery

## 2016-04-15 DIAGNOSIS — G894 Chronic pain syndrome: Secondary | ICD-10-CM

## 2016-04-15 DIAGNOSIS — M960 Pseudarthrosis after fusion or arthrodesis: Secondary | ICD-10-CM

## 2016-04-15 DIAGNOSIS — Q762 Congenital spondylolisthesis: Secondary | ICD-10-CM

## 2016-04-15 MED ORDER — DIAZEPAM 5 MG PO TABS
5.0000 mg | ORAL_TABLET | Freq: Once | ORAL | Status: AC
  Administered 2016-04-15: 5 mg via ORAL

## 2016-04-15 MED ORDER — MEPERIDINE HCL 100 MG/ML IJ SOLN
75.0000 mg | Freq: Once | INTRAMUSCULAR | Status: AC
  Administered 2016-04-15: 75 mg via INTRAMUSCULAR

## 2016-04-15 MED ORDER — IOPAMIDOL (ISOVUE-M 200) INJECTION 41%
20.0000 mL | Freq: Once | INTRAMUSCULAR | Status: AC
  Administered 2016-04-15: 20 mL via INTRATHECAL

## 2016-04-15 MED ORDER — ONDANSETRON HCL 4 MG/2ML IJ SOLN
4.0000 mg | Freq: Once | INTRAMUSCULAR | Status: AC
  Administered 2016-04-15: 4 mg via INTRAMUSCULAR

## 2016-04-15 NOTE — Progress Notes (Signed)
Pt states she has been off Lexapro and Nortriptyline for the past 2 days.

## 2016-04-15 NOTE — Discharge Instructions (Signed)
Myelogram Discharge Instructions  1. Go home and rest quietly for the next 24 hours.  It is important to lie flat for the next 24 hours.  Get up only to go to the restroom.  You may lie in the bed or on a couch on your back, your stomach, your left side or your right side.  You may have one pillow under your head.  You may have pillows between your knees while you are on your side or under your knees while you are on your back.  2. DO NOT drive today.  Recline the seat as far back as it will go, while still wearing your seat belt, on the way home.  3. You may get up to go to the bathroom as needed.  You may sit up for 10 minutes to eat.  You may resume your normal diet and medications unless otherwise indicated.  Drink lots of extra fluids today and tomorrow.  4. The incidence of headache, nausea, or vomiting is about 5% (one in 20 patients).  If you develop a headache, lie flat and drink plenty of fluids until the headache goes away.  Caffeinated beverages may be helpful.  If you develop severe nausea and vomiting or a headache that does not go away with flat bed rest, call [phone].  5. You may resume normal activities after your 24 hours of bed rest is over; however, do not exert yourself strongly or do any heavy lifting tomorrow. If when you get up you have a headache when standing, go back to bed and force fluids for another 24 hours.  6. Call your physician for a follow-up appointment.  The results of your myelogram will be sent directly to your physician by the following day.  7. If you have any questions or if complications develop after you arrive home, please call [phone].  Discharge instructions have been explained to the patient.  The patient, or the person responsible for the patient, fully understands these instructions.      May resume Nortriptyline and Lexapro on April 16, 2016, after 1:00 pm.

## 2016-05-27 ENCOUNTER — Encounter: Payer: Self-pay | Admitting: Vascular Surgery

## 2016-06-02 ENCOUNTER — Ambulatory Visit (HOSPITAL_COMMUNITY)
Admission: RE | Admit: 2016-06-02 | Discharge: 2016-06-02 | Disposition: A | Discharge status: Home / Self Care | Payer: Medicare Other | Source: Ambulatory Visit | Attending: Vascular Surgery | Admitting: Vascular Surgery

## 2016-06-02 ENCOUNTER — Ambulatory Visit (INDEPENDENT_AMBULATORY_CARE_PROVIDER_SITE_OTHER): Payer: Medicare Other | Admitting: Vascular Surgery

## 2016-06-02 ENCOUNTER — Encounter: Payer: Self-pay | Admitting: Vascular Surgery

## 2016-06-02 VITALS — BP 176/79 | HR 62 | Ht 66.5 in | Wt 163.0 lb

## 2016-06-02 DIAGNOSIS — E785 Hyperlipidemia, unspecified: Secondary | ICD-10-CM | POA: Insufficient documentation

## 2016-06-02 DIAGNOSIS — I6521 Occlusion and stenosis of right carotid artery: Secondary | ICD-10-CM | POA: Diagnosis not present

## 2016-06-02 DIAGNOSIS — I6522 Occlusion and stenosis of left carotid artery: Secondary | ICD-10-CM | POA: Diagnosis not present

## 2016-06-02 DIAGNOSIS — I10 Essential (primary) hypertension: Secondary | ICD-10-CM | POA: Diagnosis not present

## 2016-06-02 DIAGNOSIS — F329 Major depressive disorder, single episode, unspecified: Secondary | ICD-10-CM | POA: Diagnosis not present

## 2016-06-02 DIAGNOSIS — D419 Neoplasm of uncertain behavior of unspecified urinary organ: Secondary | ICD-10-CM | POA: Diagnosis not present

## 2016-06-02 DIAGNOSIS — E1142 Type 2 diabetes mellitus with diabetic polyneuropathy: Secondary | ICD-10-CM | POA: Diagnosis not present

## 2016-06-02 DIAGNOSIS — K219 Gastro-esophageal reflux disease without esophagitis: Secondary | ICD-10-CM | POA: Diagnosis not present

## 2016-06-02 LAB — VAS US CAROTID
LCCADDIAS: 17 cm/s
LCCADSYS: 66 cm/s
LCCAPDIAS: 22 cm/s
LCCAPSYS: 86 cm/s
LEFT ECA DIAS: -20 cm/s
LEFT VERTEBRAL DIAS: 28 cm/s
LICADDIAS: -28 cm/s
LICADSYS: -93 cm/s
Left ICA prox dias: 24 cm/s
Left ICA prox sys: 97 cm/s
RCCADSYS: -96 cm/s
RCCAPSYS: 72 cm/s
RIGHT CCA MID DIAS: 20 cm/s
RIGHT ECA DIAS: -9 cm/s
RIGHT VERTEBRAL DIAS: 24 cm/s
Right CCA prox dias: 16 cm/s

## 2016-06-02 NOTE — Progress Notes (Signed)
Subjective:     Patient ID: Lauren [name], female   DOB: [birth year], 65 y.o.   MRN: [id number]  HPI this 65 year old female returns for continued follow-up regarding her right carotid endarterectomy performed 08/17/2015 for severe right ICA stenosis with episode of amaurosis fugax. She is taking one aspirin 81 mg per day. She has had no recurrent neurologic symptoms including lateralizing weakness, aphasia, amaurosis fugax, diplopia, blurred vision, or syncope. She does continue to have ongoing tobacco abuse smoking about one pack per day. She needs to have lumbar spine surgery by Dr.Cohen but will only have this if she is able to discontinue smoking.  Past Medical History  Diagnosis Date  . Diabetes mellitus   . Hypertension   . COPD (chronic obstructive pulmonary disease) ([place])   . Depression   . Osteoarthritis   . Ventral hernia     postoperative since 2009  . Peripheral neuropathy ([place])     following chemotherapy  . Carotid artery occlusion   . Radiation 05/01/2011-06/11/11    5600 cGy 28 fxs/periaortic  . Anemia   . GERD (gastroesophageal reflux disease)   . Insomnia   . Hyperlipidemia   . OAB (overactive bladder)   . Psoriasis   . DDD (degenerative disc disease), lumbar   . Gout 2015  . TIA (transient ischemic attack) 2015  . Hernia of flank   . Endometrial ca [facility])     endometrial ca dx 4/11  . 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 Uterine cancer ([place])   . Anxiety   . Cataract     bil cateracts removed  . Stroke ([place])     tia  . Cough     Social History  Substance Use Topics  . Smoking status: Light Tobacco Smoker -- 1.00 packs/day for 40 years    Types: Cigarettes  . Smokeless tobacco: Never Used  . Alcohol Use: No    Family History  Problem Relation Age of Onset  . Stroke Mother   . Irritable bowel syndrome Mother   . Heart attack Father 57    died  . Heart disease Father     Heart Disease before age 93  . Hyperlipidemia  Father   . Hypertension Father   . Diabetes Daughter   . Hypertension Daughter   . Irritable bowel syndrome Daughter   . Colon cancer Neg Hx   . Esophageal cancer Neg Hx   . Pancreatic cancer Neg Hx   . Rectal cancer Neg Hx   . Stomach cancer Neg Hx     Allergies  Allergen Reactions  . Ambien [Zolpidem Tartrate] Other (See Comments)    Per the pt, "it makes me go crazy"  . Sulfa Antibiotics Rash  . Elavil [Amitriptyline Hcl] Other (See Comments)    Gastritis  . Prozac [Fluoxetine Hcl] Anxiety and Other (See Comments)    irritability      Current outpatient prescriptions:  .  albuterol (PROVENTIL HFA;VENTOLIN HFA) 108 (90 BASE) MCG/ACT inhaler, Inhale 2 puffs into the lungs every 6 (six) hours as needed for wheezing or shortness of breath., Disp: , Rfl:  .  ALPRAZolam (XANAX) 0.5 MG tablet, Take 0.5 mg by mouth at bedtime as needed.  , Disp: , Rfl:  .  aspirin 81 MG tablet, Take 81 mg by mouth daily.  , Disp: , Rfl:  .  atorvastatin (LIPITOR) 10 MG tablet, Take 10 mg by mouth daily., Disp: , Rfl:  .  betamethasone dipropionate (DIPROLENE) 0.05 % ointment, Apply topically 2 (two) times daily., Disp: , Rfl:  .  chlorthalidone (HYGROTON) 25 MG tablet, Take 25 mg by mouth every morning.  , Disp: , Rfl:  .  Cholecalciferol (VITAMIN D3) 1000 UNITS CAPS, Take 1 Units by mouth daily.  , Disp: , Rfl:  .  Cyanocobalamin (VITAMIN B-12 CR) 1500 MCG TBCR, Take by mouth daily., Disp: , Rfl:  .  escitalopram (LEXAPRO) 20 MG tablet, Take 20 mg by mouth daily., Disp: , Rfl:  .  esomeprazole (NEXIUM) 40 MG capsule, Take 40 mg by mouth daily. , Disp: , Rfl:  .  Fluticasone-Salmeterol (ADVAIR) 250-50 MCG/DOSE AEPB, Inhale 1 puff into the lungs every 12 (twelve) hours.  , Disp: , Rfl:  .  folic acid (FOLVITE) 1 MG tablet, Take 1 mg by mouth daily., Disp: , Rfl:  .  gabapentin (NEURONTIN) 600 MG tablet, Take 600 mg by mouth 2 (two) times daily. Pt may take up to 6 Tabs if needed per day., Disp: , Rfl:   .  glucose blood (ACCU-CHEK AVIVA PLUS) test strip, 1 each by Other route as needed. Use as instructed , Disp: , Rfl:  .  insulin glargine (LANTUS) 100 UNIT/ML injection, Inject 35 Units into the skin daily. , Disp: , Rfl:  .  losartan (COZAAR) 100 MG tablet, Take 100 mg by mouth daily.  , Disp: , Rfl:  .  magnesium oxide (MAG-OX) 400 MG tablet, Take 400 mg by mouth daily., Disp: , Rfl:  .  metFORMIN (GLUMETZA) 1000 MG (MOD) 24 hr tablet, Take 1,000 mg by mouth 2 (two) times daily with a meal.  , Disp: , Rfl:  .  methocarbamol (ROBAXIN) 750 MG tablet, Take 750 mg by mouth daily., Disp: , Rfl:  .  methotrexate (RHEUMATREX) 2.5 MG tablet, Take 15 mg by mouth once a week. Caution:Chemotherapy. Protect from light., Disp: , Rfl:  .  metoprolol (TOPROL-XL) 100 MG 24 hr tablet, Take 50 mg by mouth daily after breakfast. , Disp: , Rfl:  .  Multiple Vitamin (MULTIVITAMIN) tablet, Take 1 tablet by mouth daily.  , Disp: , Rfl:  .  nitroGLYCERIN (NITROSTAT) 0.4 MG SL tablet, Place 1 tablet (0.4 mg total) under the tongue every 5 (five) minutes as needed for chest pain., Disp: 25 tablet, Rfl: 3 .  nortriptyline (PAMELOR) 10 MG capsule, Take 20-30 mg by mouth at bedtime. , Disp: , Rfl:  .  ondansetron (ZOFRAN) 4 MG tablet, Take 4 mg by mouth every 8 (eight) hours as needed for nausea or vomiting. , Disp: , Rfl:  .  OxyCODONE (OXYCONTIN) 40 mg T12A 12 hr tablet, Take 60 mg by mouth every 12 (twelve) hours. , Disp: , Rfl:  .  oxyCODONE-acetaminophen (PERCOCET) 10-325 MG per tablet, Take 1 tablet by mouth every 4 (four) hours as needed for pain., Disp: 30 tablet, Rfl: 0 .  potassium chloride (KLOR-CON) 10 MEQ CR tablet, Take 10 mEq by mouth daily.  , Disp: , Rfl:  .  saccharomyces boulardii (FLORASTOR) 250 MG capsule, Take 250 mg by mouth daily. PROBIOTIC, Disp: , Rfl:  .  trimethoprim (TRIMPEX) 100 MG tablet, , Disp: , Rfl:  .  tamsulosin (FLOMAX) 0.4 MG CAPS capsule, Take 0.4 mg by mouth every evening. Reported  on 06/02/2016, Disp: , Rfl:   Filed Vitals:   06/02/16 1451 06/02/16 1453  BP: 176/76 176/79  Pulse: 62   Height: 5' 6.5" (1.689 m)   Weight: 163 lb (  73.936 kg)   SpO2: 96%     Body mass index is 25.92 kg/(m^2).           Review of Systems denies chest pain but does have dyspnea on exertion and occasional orthopnea with wheezing. Denies chronic bronchitis. Does have ongoing tobacco abuse as noted above. Has history of diabetes mellitus, hypertension, hyperlipidemia, COPD, GERD, endometrial cancer. Other systems negative and complete review of systems     Objective:   Physical Exam BP 176/79 mmHg  Pulse 62  Ht 5' 6.5" (1.689 m)  Wt 163 lb (73.936 kg)  BMI 25.92 kg/m2  SpO2 96%    Gen.-alert and oriented x3 in no apparent distress HEENT normal for age Lungs no rhonchi-expiratory wheezing noted bilaterally Cardiovascular regular rhythm no murmurs carotid pulses 3+ palpable no bruits audible Abdomen soft nontender no palpable masses-obese Musculoskeletal free of  major deformities Skin clear -no rashes Neurologic normal Lower extremities 3+ femoral and dorsalis pedis pulses palpable bilaterally with no edema  Today I ordered a carotid duplex exam which I reviewed and interpreted. The right internal carotid artery at the site of the endarterectomy and patch is widely patent with no restenosis Left internal carotid has mild stenosis less than 40%.       Assessment:     Status post right carotid endarterectomy for severe stenosis with amaurosis fugax-resolved Ongoing tobacco abuse Severe degenerative disc disease lumbar spine needs surgery COPD Hypertension Hyperlipidemia GERD History of endometrial cancer    Plan:     Patient strongly encouraged to discontinue smoking completely which she states she is going today She will return in 6 months to see Dr. Servando Snare follow-up carotid duplex exam unless she develops any neurologic symptoms in the  interim Continue daily aspirin 81 mg

## 2016-08-07 ENCOUNTER — Other Ambulatory Visit: Payer: Self-pay | Admitting: Orthopaedic Surgery

## 2016-08-07 DIAGNOSIS — M8580 Other specified disorders of bone density and structure, unspecified site: Secondary | ICD-10-CM

## 2016-08-14 ENCOUNTER — Ambulatory Visit: Payer: Medicare Other | Admitting: Gynecology

## 2016-08-20 ENCOUNTER — Ambulatory Visit: Payer: Medicare Other | Admitting: Gynecology

## 2016-08-22 ENCOUNTER — Other Ambulatory Visit: Payer: Self-pay | Admitting: Family Medicine

## 2016-08-22 DIAGNOSIS — R911 Solitary pulmonary nodule: Secondary | ICD-10-CM

## 2016-08-25 ENCOUNTER — Telehealth: Payer: Self-pay | Admitting: Cardiovascular Disease

## 2016-08-25 NOTE — Telephone Encounter (Signed)
Spoke to [facility] regarding surgical clearance-Reports procedure is by MD Patrice Paradise, unsure specific procedure.  OV already scheduled 10/2 with MD Croitoru per [facility] request regarding clearance.   Requesting to be seen sooner if possible or put on wait list d/t pt increase in pain.  Will send to scheduler.

## 2016-08-25 NOTE — Telephone Encounter (Signed)
New message     Request for surgical clearance:  What type of surgery is being performed? Spine surgery  When is this surgery scheduled? No  Are there any medications that need to be held prior to surgery and how long? Not sure  Name of physician performing surgery? Unsure  1. What is your office phone and fax number? [phone],

## 2016-08-27 ENCOUNTER — Ambulatory Visit
Admission: RE | Admit: 2016-08-27 | Discharge: 2016-08-27 | Disposition: A | Discharge status: Home / Self Care | Payer: Medicare Other | Source: Ambulatory Visit | Attending: Orthopaedic Surgery | Admitting: Orthopaedic Surgery

## 2016-08-27 DIAGNOSIS — M8580 Other specified disorders of bone density and structure, unspecified site: Secondary | ICD-10-CM

## 2016-09-02 ENCOUNTER — Other Ambulatory Visit: Payer: Medicare Other

## 2016-09-03 ENCOUNTER — Encounter: Payer: Medicare Other | Admitting: Neurology

## 2016-09-04 ENCOUNTER — Ambulatory Visit
Admission: RE | Admit: 2016-09-04 | Discharge: 2016-09-04 | Disposition: A | Discharge status: Home / Self Care | Payer: Medicare Other | Source: Ambulatory Visit | Attending: Family Medicine | Admitting: Family Medicine

## 2016-09-04 DIAGNOSIS — R911 Solitary pulmonary nodule: Secondary | ICD-10-CM

## 2016-09-16 ENCOUNTER — Other Ambulatory Visit: Payer: Self-pay | Admitting: Family Medicine

## 2016-09-16 DIAGNOSIS — E041 Nontoxic single thyroid nodule: Secondary | ICD-10-CM

## 2016-09-17 ENCOUNTER — Telehealth: Payer: Self-pay | Admitting: Internal Medicine

## 2016-09-17 NOTE — Telephone Encounter (Signed)
I spoke with Judeen Hammans at Dr. Deboraha Sprang office they want patient seen soon, but declined an appt with Dr. Carlean Purl on 09/26/16 or an APP.  Judeen Hammans reports that Dr. Sandi Mariscal wants to dictate a letter before patient is seen and doesn't have time to get it together before 09/26/16.  She also refused appt with APP for 1st week in October.  Judeen Hammans doesn't want the patient to have to wait until December but declines all appt offers. She will call back after she speaks to Dr. Sandi Mariscal

## 2016-09-24 ENCOUNTER — Ambulatory Visit
Admission: RE | Admit: 2016-09-24 | Discharge: 2016-09-24 | Disposition: A | Discharge status: Home / Self Care | Payer: Medicare Other | Source: Ambulatory Visit | Attending: Family Medicine | Admitting: Family Medicine

## 2016-09-24 DIAGNOSIS — E041 Nontoxic single thyroid nodule: Secondary | ICD-10-CM

## 2016-09-26 ENCOUNTER — Ambulatory Visit (INDEPENDENT_AMBULATORY_CARE_PROVIDER_SITE_OTHER): Payer: Medicare Other | Admitting: Internal Medicine

## 2016-09-26 ENCOUNTER — Other Ambulatory Visit: Payer: Self-pay | Admitting: Family Medicine

## 2016-09-26 ENCOUNTER — Encounter: Payer: Self-pay | Admitting: Internal Medicine

## 2016-09-26 VITALS — BP 154/64 | HR 64 | Ht 65.0 in | Wt 180.0 lb

## 2016-09-26 DIAGNOSIS — I6522 Occlusion and stenosis of left carotid artery: Secondary | ICD-10-CM

## 2016-09-26 DIAGNOSIS — D509 Iron deficiency anemia, unspecified: Secondary | ICD-10-CM | POA: Diagnosis not present

## 2016-09-26 DIAGNOSIS — K5520 Angiodysplasia of colon without hemorrhage: Secondary | ICD-10-CM

## 2016-09-26 DIAGNOSIS — E041 Nontoxic single thyroid nodule: Secondary | ICD-10-CM

## 2016-09-26 NOTE — Patient Instructions (Signed)
  You have been scheduled for an endoscopy. Please follow written instructions given to you at your visit today. If you use inhalers (even only as needed), please bring them with you on the day of your procedure.   I appreciate the opportunity to care for you. Carl Gessner, MD, FACG 

## 2016-09-26 NOTE — Progress Notes (Signed)
Lauren [name] 65 y.o. July 14, 1951 [id number]  Assessment & Plan:   1. Anemia, iron deficiency   2. Angiodysplasia of colon    Workup with an EGD, colonoscopy 2015 demonstrated angiodysplasia, she could very well have multiple areas of this causing leakage of blood and iron deficiency anemia though I suspect her anemia is probably multifactorial in part chronic disease in part iron deficiency. If her EGD is unremarkable, we would need to consider seeing how she does on iron therapy alone versus attempting ablation of the AVM.  The risks and benefits as well as alternatives of endoscopic procedure(s) have been discussed and reviewed. All questions answered. The patient agrees to proceed.  I appreciate the opportunity to care for this patient. CC: Marylene Land, MD     Subjective:   Chief Complaint: Here for iron deficiency anemia.  HPI  Patient is here with her husband, she was seeing Dr. Sandi Mariscal recently and he was following up on anemia and discovered she was iron deficient. Hemoglobin 9.9 on September 14 with a ferritin 18 and iron saturation 15% TIBC 383. Anemia began after several back surgeries, she's also had endometrial carcinoma. I had performed a colonoscopy in 2015 that demonstrated a polyp and also an AVM in the right colon. That was not treated. She denies any obvious melena or bleeding and thus husband reports that her Hemoccult testing at Dr. Deboraha Sprang office was negative. She is waiting back surgery with Dr. Patrice Paradise to try to help her with her chronic back pain, she has had multiple back surgeries already. She tells me she would never want have a colonoscopy again if she can avoid it. She will, but she does not like the prep.  Allergies  Allergen Reactions  . Ambien [Zolpidem Tartrate] Other (See Comments)    Per the pt, "it makes me go crazy"  . Sulfa Antibiotics Rash  . Elavil [Amitriptyline Hcl] Other (See Comments)    Gastritis  . Prozac [Fluoxetine  Hcl] Anxiety and Other (See Comments)    irritability    Outpatient Medications Prior to Visit  Medication Sig Dispense Refill  . albuterol (PROVENTIL HFA;VENTOLIN HFA) 108 (90 BASE) MCG/ACT inhaler Inhale 2 puffs into the lungs every 6 (six) hours as needed for wheezing or shortness of breath.    . ALPRAZolam (XANAX) 0.5 MG tablet Take 0.5 mg by mouth at bedtime as needed.      Marland Kitchen aspirin 81 MG tablet Take 81 mg by mouth daily.      . betamethasone dipropionate (DIPROLENE) 0.05 % ointment Apply topically 2 (two) times daily.    . chlorthalidone (HYGROTON) 25 MG tablet Take 25 mg by mouth every morning.      . Cholecalciferol (VITAMIN D3) 1000 UNITS CAPS Take 1 Units by mouth daily.      . Cyanocobalamin (VITAMIN B-12 CR) 1500 MCG TBCR Take by mouth daily.    Marland Kitchen escitalopram (LEXAPRO) 20 MG tablet Take 20 mg by mouth daily.    Marland Kitchen esomeprazole (NEXIUM) 40 MG capsule Take 40 mg by mouth daily.     . Fluticasone-Salmeterol (ADVAIR) 250-50 MCG/DOSE AEPB Inhale 1 puff into the lungs every 12 (twelve) hours.      . folic acid (FOLVITE) 1 MG tablet Take 1 mg by mouth daily.    Marland Kitchen gabapentin (NEURONTIN) 600 MG tablet Take 600 mg by mouth 2 (two) times daily. Pt may take up to 6 Tabs if needed per day.    Marland Kitchen glucose blood (ACCU-CHEK AVIVA PLUS)  test strip 1 each by Other route as needed. Use as instructed     . insulin glargine (LANTUS) 100 UNIT/ML injection Inject 25 Units into the skin daily.     Marland Kitchen losartan (COZAAR) 100 MG tablet Take 100 mg by mouth daily.      . magnesium oxide (MAG-OX) 400 MG tablet Take 400 mg by mouth daily.    . metFORMIN (GLUMETZA) 1000 MG (MOD) 24 hr tablet Take 1,000 mg by mouth 2 (two) times daily with a meal.      . methocarbamol (ROBAXIN) 750 MG tablet Take 750 mg by mouth daily.    . methotrexate (RHEUMATREX) 2.5 MG tablet Take 15 mg by mouth once a week. Caution:Chemotherapy. Protect from light.    . metoprolol (TOPROL-XL) 100 MG 24 hr tablet Take 50 mg by mouth daily  after breakfast.     . Multiple Vitamin (MULTIVITAMIN) tablet Take 1 tablet by mouth daily.      . nortriptyline (PAMELOR) 10 MG capsule Take 20-30 mg by mouth at bedtime.     . ondansetron (ZOFRAN) 4 MG tablet Take 4 mg by mouth every 8 (eight) hours as needed for nausea or vomiting.     . OxyCODONE (OXYCONTIN) 40 mg T12A 12 hr tablet Take 60 mg by mouth every 12 (twelve) hours.     Marland Kitchen oxyCODONE-acetaminophen (PERCOCET) 10-325 MG per tablet Take 1 tablet by mouth every 4 (four) hours as needed for pain. 30 tablet 0  . potassium chloride (KLOR-CON) 10 MEQ CR tablet Take 10 mEq by mouth daily.      Marland Kitchen saccharomyces boulardii (FLORASTOR) 250 MG capsule Take 250 mg by mouth daily. PROBIOTIC    . atorvastatin (LIPITOR) 10 MG tablet Take 10 mg by mouth daily.    . nitroGLYCERIN (NITROSTAT) 0.4 MG SL tablet Place 1 tablet (0.4 mg total) under the tongue every 5 (five) minutes as needed for chest pain. (Patient not taking: Reported on 09/26/2016) 25 tablet 3  . tamsulosin (FLOMAX) 0.4 MG CAPS capsule Take 0.4 mg by mouth every evening. Reported on 06/02/2016    . trimethoprim (TRIMPEX) 100 MG tablet      No facility-administered medications prior to visit.    Past Medical History:  Diagnosis Date  . Anemia   . Anxiety   . CAD (coronary artery disease)   . Carotid artery occlusion   . Cataract    bil cateracts removed  . COPD (chronic obstructive pulmonary disease) ([place])   . Cough   . DDD (degenerative disc disease), lumbar   . Depression   . Diabetes mellitus   . Endometrial ca [facility])    endometrial ca dx 4/11  . GERD (gastroesophageal reflux disease)   . Gout 2015  . Hernia of flank   . Hyperlipidemia   . Hypertension   . Insomnia   . OAB (overactive bladder)   . Osteoarthritis   . Peripheral neuropathy ([place])    following chemotherapy  . Psoriasis   . Pulmonary nodule    since 2016  . Radiation 05/01/2011-06/11/11   5600 cGy 28 fxs/periaortic  . Stroke ([place])    tia  . TIA (transient  ischemic attack) 2015  . 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 Uterine cancer ([place])   . Ventral hernia    postoperative since 2009   Past Surgical History:  Procedure Laterality Date  . ABDOMINAL HYSTERECTOMY  2009   for ca  . BACK SURGERY  Aug/sept 2014   3 back surgeries  (Aug 2014 & Sept 2014)  . CHOLECYSTECTOMY     biliteral  . COLONOSCOPY  11/13/2014   negative except for a small cecal AVM and 1 polyp  . ENDARTERECTOMY Right 08/17/2015   Procedure: Right Carotid Endartarectomy with patch Angioplasty ;  Surgeon: Mal Misty, MD;  Location: [facility];  Service: Vascular;  Laterality: Right;  . EYE SURGERY Right Aug. 2013   Cataract, lens implant  . EYE SURGERY Left Sept. 2013   Cataract, lens implant  . I & D of lumbar fusion /removal of hardware for chronic infection  Jan. 8, 2015  . right carotid endarterectomy  2016  . SPINE SURGERY     Social History   Social History  . Marital status: Married    Spouse name: N/A  . Number of children: 1  . Years of education: N/A   Occupational History  . disabled    Social History Main Topics  . Smoking status: Light Tobacco Smoker    Packs/day: 1.00    Years: 40.00    Types: Cigarettes  . Smokeless tobacco: Never Used  . Alcohol use No  . Drug use: No  . Sexual activity: No   Other Topics Concern  . None   Social History Narrative   Married, 1 daughter   disabled   Family History  Problem Relation Age of Onset  . Stroke Mother   . Irritable bowel syndrome Mother   . Heart attack Father 96    died  . Heart disease Father     Heart Disease before age 70, rheumatic heart disease  . Hyperlipidemia Father   . Hypertension Father   . Diabetes Daughter   . Hypertension Daughter   . Irritable bowel syndrome Daughter   . Colon cancer Neg Hx   . Esophageal cancer Neg Hx   . Pancreatic cancer Neg Hx   . Rectal cancer Neg Hx   . Stomach cancer Neg Hx        Review of Systems Some  fatigue and weakness, chronic back pain as above.  Objective:   Physical Exam @BP  (!) 154/64 (BP Location: Left Arm, Patient Position: Sitting, Cuff Size: Normal)   Pulse 64   Ht 5\' 5"  (1.651 m)   Wt 180 lb (81.6 kg)   BMI 29.95 kg/m @  General:  NAD Eyes:   anicteric Lungs:  Sl wheezy Heart::  S1S2 no rubs, murmurs or gallops Abdomen:  soft and nontender, BS+ large RLQ hernia Ext:   no edema, cyanosis or clubbing    Data Reviewed:  As per history of present illness

## 2016-09-29 ENCOUNTER — Ambulatory Visit: Payer: Medicare Other | Admitting: Cardiovascular Disease

## 2016-10-03 DIAGNOSIS — M519 Unspecified thoracic, thoracolumbar and lumbosacral intervertebral disc disorder: Secondary | ICD-10-CM | POA: Insufficient documentation

## 2016-10-03 DIAGNOSIS — R531 Weakness: Secondary | ICD-10-CM | POA: Insufficient documentation

## 2016-10-03 DIAGNOSIS — M4310 Spondylolisthesis, site unspecified: Secondary | ICD-10-CM | POA: Insufficient documentation

## 2016-10-03 DIAGNOSIS — M545 Low back pain, unspecified: Secondary | ICD-10-CM | POA: Insufficient documentation

## 2016-10-03 DIAGNOSIS — G959 Disease of spinal cord, unspecified: Secondary | ICD-10-CM | POA: Insufficient documentation

## 2016-10-03 DIAGNOSIS — M5127 Other intervertebral disc displacement, lumbosacral region: Secondary | ICD-10-CM | POA: Insufficient documentation

## 2016-10-03 DIAGNOSIS — R5383 Other fatigue: Secondary | ICD-10-CM | POA: Insufficient documentation

## 2016-10-03 DIAGNOSIS — M5136 Other intervertebral disc degeneration, lumbar region: Secondary | ICD-10-CM | POA: Insufficient documentation

## 2016-10-06 ENCOUNTER — Ambulatory Visit (INDEPENDENT_AMBULATORY_CARE_PROVIDER_SITE_OTHER): Payer: Medicare Other | Admitting: Cardiovascular Disease

## 2016-10-06 ENCOUNTER — Encounter: Payer: Self-pay | Admitting: Cardiovascular Disease

## 2016-10-06 VITALS — BP 159/71 | HR 59 | Ht 65.0 in | Wt 178.0 lb

## 2016-10-06 DIAGNOSIS — I2510 Atherosclerotic heart disease of native coronary artery without angina pectoris: Secondary | ICD-10-CM | POA: Diagnosis not present

## 2016-10-06 DIAGNOSIS — E1159 Type 2 diabetes mellitus with other circulatory complications: Secondary | ICD-10-CM | POA: Diagnosis not present

## 2016-10-06 DIAGNOSIS — Z794 Long term (current) use of insulin: Secondary | ICD-10-CM

## 2016-10-06 DIAGNOSIS — Z720 Tobacco use: Secondary | ICD-10-CM

## 2016-10-06 DIAGNOSIS — I10 Essential (primary) hypertension: Secondary | ICD-10-CM | POA: Diagnosis not present

## 2016-10-06 DIAGNOSIS — Z01810 Encounter for preprocedural cardiovascular examination: Secondary | ICD-10-CM

## 2016-10-06 DIAGNOSIS — I739 Peripheral vascular disease, unspecified: Secondary | ICD-10-CM

## 2016-10-06 DIAGNOSIS — E786 Lipoprotein deficiency: Secondary | ICD-10-CM

## 2016-10-06 DIAGNOSIS — I779 Disorder of arteries and arterioles, unspecified: Secondary | ICD-10-CM

## 2016-10-06 DIAGNOSIS — E785 Hyperlipidemia, unspecified: Secondary | ICD-10-CM

## 2016-10-06 DIAGNOSIS — D500 Iron deficiency anemia secondary to blood loss (chronic): Secondary | ICD-10-CM

## 2016-10-06 DIAGNOSIS — I2584 Coronary atherosclerosis due to calcified coronary lesion: Secondary | ICD-10-CM

## 2016-10-06 NOTE — Progress Notes (Signed)
Cardiology Consultation Note    Date:  10/07/2016   ID:  Lauren [name] [name], DOB Jan 04, 1951, MRN [id number]  PCP:  Marylene Land, MD  Cardiologist:   Sanda Klein, MD   Chief Complaint  Patient presents with  . Follow-up    pt states she is having back surgery in 1-2 months , and having a biopsy on her thoid  next week needs clearance     History of Present Illness:  Lauren [name] [name] is a 65 y.o. female referred by Dr. Sandi Mariscal prior to her planned upcoming lumbar spine surgery.  Lauren [name] [name] has coronary artery calcification on CT scan and has numerous coronary risk factors (hypertension, hyperlipidemia, diabetes, positive family history, ongoing cigarette smoking) and already has well-established peripheral arterial disease (R carotid endarterectomy 2016, L carotid 40%). However, she does not have angina pectoris and had a low risk nuclear stress test in 2014 and again in 2016. The latter study showed slight reduction in LVEF but this may have been a problem with the gated study. She has undergone back surgery in the recent past without cardiac complications.  Unfortunately she continues to have severe lumbar radiculopathy even after losing a lot of weight. She has especially bad left sciatica pain. She is scheduled to have back surgery again with Dr. Bennie Pierini:. She is trying to quit smoking completely. Her husband is also trying to quit smoking.   Activity level is primarily limited by her back symptoms, but she seems to have overall good functional status. She does not have angina or dyspnea with activities of daily living.  Her blood pressure is fine the clinic today and was also high when she saw Dr. Elder Cyphers on September 29, but she had normal blood pressure when she saw Vinnie Level Nickel in the vascular clinic in August. Pain clearly is causing increases in her blood pressure.  Another recent problem has been anemia and she does have intermittent epigastric discomfort and possibly  melena. In 2015 she had EGD and colonoscopy and was found to have angiodysplasia. She is scheduled for upper endoscopy with Dr. Carlean Purl. She has significant arthritis and also has severe psoriasis, recently treated with methotrexate and topical steroids, without the use of nonsteroidal anti-inflammatory drugs.   Past Medical History:  Diagnosis Date  . Anemia   . Anxiety   . CAD (coronary artery disease)   . Carotid artery occlusion   . Cataract    bil cateracts removed  . COPD (chronic obstructive pulmonary disease) ([place])   . Cough   . DDD (degenerative disc disease), lumbar   . Depression   . Diabetes mellitus   . Endometrial ca [facility])    endometrial ca dx 4/11  . GERD (gastroesophageal reflux disease)   . Gout 2015  . Hernia of flank   . Hyperlipidemia   . Hypertension   . Insomnia   . OAB (overactive bladder)   . Osteoarthritis   . Peripheral neuropathy ([place])    following chemotherapy  . Psoriasis   . Pulmonary nodule    since 2016  . Radiation 05/01/2011-06/11/11   5600 cGy 28 fxs/periaortic  . Stroke ([place])    tia  . TIA (transient ischemic attack) 2015  . 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 Uterine cancer ([place])   . Ventral hernia    postoperative since 2009    Past Surgical History:  Procedure Laterality Date  . ABDOMINAL HYSTERECTOMY  2009   for ca  .  BACK SURGERY  Aug/sept 2014   3 back surgeries  (Aug 2014 & Sept 2014)  . CHOLECYSTECTOMY     biliteral  . COLONOSCOPY  11/13/2014   negative except for a small cecal AVM and 1 polyp  . ENDARTERECTOMY Right 08/17/2015   Procedure: Right Carotid Endartarectomy with patch Angioplasty ;  Surgeon: Mal Misty, MD;  Location: [facility];  Service: Vascular;  Laterality: Right;  . EYE SURGERY Right Aug. 2013   Cataract, lens implant  . EYE SURGERY Left Sept. 2013   Cataract, lens implant  . I & D of lumbar fusion /removal of hardware for chronic infection  Jan. 8, 2015  . right carotid  endarterectomy  2016  . SPINE SURGERY      Current Medications: Outpatient Medications Prior to Visit  Medication Sig Dispense Refill  . albuterol (PROVENTIL HFA;VENTOLIN HFA) 108 (90 BASE) MCG/ACT inhaler Inhale 2 puffs into the lungs every 6 (six) hours as needed for wheezing or shortness of breath.    . ALPRAZolam (XANAX) 0.5 MG tablet Take 0.5 mg by mouth at bedtime as needed.      Marland Kitchen aspirin 81 MG tablet Take 81 mg by mouth daily.      Marland Kitchen atorvastatin (LIPITOR) 10 MG tablet Take 10 mg by mouth daily.    . betamethasone dipropionate (DIPROLENE) 0.05 % ointment Apply topically 2 (two) times daily.    . chlorthalidone (HYGROTON) 25 MG tablet Take 25 mg by mouth every morning.      . Cholecalciferol (VITAMIN D3) 1000 UNITS CAPS Take 1 Units by mouth daily.      . Cyanocobalamin (VITAMIN B-12 CR) 1500 MCG TBCR Take by mouth daily.    Marland Kitchen escitalopram (LEXAPRO) 20 MG tablet Take 20 mg by mouth daily.    Marland Kitchen esomeprazole (NEXIUM) 40 MG capsule Take 40 mg by mouth daily.     . Fluticasone-Salmeterol (ADVAIR) 250-50 MCG/DOSE AEPB Inhale 1 puff into the lungs every 12 (twelve) hours.      . folic acid (FOLVITE) 1 MG tablet Take 1 mg by mouth daily.    Marland Kitchen gabapentin (NEURONTIN) 600 MG tablet Take 600 mg by mouth 2 (two) times daily. Pt may take up to 6 Tabs if needed per day.    Marland Kitchen glucose blood (ACCU-CHEK AVIVA PLUS) test strip 1 each by Other route as needed. Use as instructed     . insulin glargine (LANTUS) 100 UNIT/ML injection Inject 35 Units into the skin daily.     Marland Kitchen losartan (COZAAR) 100 MG tablet Take 100 mg by mouth daily.      . magnesium oxide (MAG-OX) 400 MG tablet Take 400 mg by mouth daily.    . metFORMIN (GLUMETZA) 1000 MG (MOD) 24 hr tablet Take 1,000 mg by mouth 2 (two) times daily with a meal.      . methocarbamol (ROBAXIN) 750 MG tablet Take 750 mg by mouth daily.    . methotrexate (RHEUMATREX) 2.5 MG tablet Take 15 mg by mouth once a week. Caution:Chemotherapy. Protect from light.      . metoprolol (TOPROL-XL) 100 MG 24 hr tablet Take 50 mg by mouth daily after breakfast.     . Multiple Vitamin (MULTIVITAMIN) tablet Take 1 tablet by mouth daily.      . nitrofurantoin (MACRODANTIN) 100 MG capsule Take 100 mg by mouth daily.    . nitroGLYCERIN (NITROSTAT) 0.4 MG SL tablet Place 1 tablet (0.4 mg total) under the tongue every 5 (five) minutes as needed  for chest pain. 25 tablet 3  . nortriptyline (PAMELOR) 10 MG capsule Take 20-30 mg by mouth at bedtime.     . ondansetron (ZOFRAN) 4 MG tablet Take 4 mg by mouth every 8 (eight) hours as needed for nausea or vomiting.     . ondansetron (ZOFRAN-ODT) 4 MG disintegrating tablet     . OxyCODONE (OXYCONTIN) 40 mg T12A 12 hr tablet Take 60 mg by mouth every 12 (twelve) hours.     Marland Kitchen oxyCODONE-acetaminophen (PERCOCET) 10-325 MG per tablet Take 1 tablet by mouth every 4 (four) hours as needed for pain. 30 tablet 0  . potassium chloride (KLOR-CON) 10 MEQ CR tablet Take 10 mEq by mouth daily.      Marland Kitchen saccharomyces boulardii (FLORASTOR) 250 MG capsule Take 250 mg by mouth daily. PROBIOTIC    . tamsulosin (FLOMAX) 0.4 MG CAPS capsule Take 0.4 mg by mouth.    . varenicline (CHANTIX) 1 MG tablet Take 1 mg by mouth 2 (two) times daily.     No facility-administered medications prior to visit.      Allergies:   Ambien [zolpidem tartrate]; Sulfa antibiotics; Elavil [amitriptyline hcl]; and Prozac [fluoxetine hcl]   Social History   Social History  . Marital status: Married    Spouse name: N/A  . Number of children: 1  . Years of education: N/A   Occupational History  . disabled    Social History Main Topics  . Smoking status: Light Tobacco Smoker    Packs/day: 1.00    Years: 40.00    Types: Cigarettes  . Smokeless tobacco: Never Used  . Alcohol use No  . Drug use: No  . Sexual activity: No   Other Topics Concern  . None   Social History Narrative   Married, 1 daughter   disabled     Family History:  The patient's family  history includes Diabetes in her daughter; Heart attack (age of onset: 71) in her father; Heart disease in her father; Hyperlipidemia in her father; Hypertension in her daughter and father; Irritable bowel syndrome in her daughter and mother; Stroke in her mother.   ROS:   Please see the history of present illness.    ROS All other systems reviewed and are negative.   PHYSICAL EXAM:   VS:  BP (!) 159/71 (BP Location: Left Arm, Patient Position: Sitting, Cuff Size: Normal)   Pulse (!) 59   Ht 5\' 5"  (1.651 m)   Wt 178 lb (80.7 kg)   SpO2 96%   BMI 29.62 kg/m    GEN: Well nourished, well developed, in no acute distress  HEENT: normal  Neck: no JVD, Right carotid endarterectomy scar, left carotid bruit, no goiter or masses Cardiac: RRR; no murmurs, rubs, or gallops,no edema, prominent varicose veins  Respiratory:  clear to auscultation bilaterally, normal work of breathing GI: soft, nontender, nondistended, + BS; large right lower quadrant hernia without evidence of incarceration/strangulation MS: no deformity or atrophy  Skin: warm and dry, psoriatic rash on the legs, varicose veins without edema Neuro:  Alert and Oriented x 3, Strength and sensation are intact Psych: euthymic mood, full affect  Wt Readings from Last 3 Encounters:  10/06/16 178 lb (80.7 kg)  09/26/16 180 lb (81.6 kg)  06/02/16 163 lb (73.9 kg)      Studies/Labs Reviewed:   EKG:  EKG is ordered today.  The ekg ordered today demonstrates Normal sinus rhythm, normal tracing. Mild counterclockwise rotation, no repolarization abnormalities  Recent Labs: 09/02/2016  Total cholesterol 150, LDL 86, LDL particle #1065, HDL 38, triglyceride 129 Hemoglobin A1c 5.8% Creatinine 1.15, hemoglobin 9.9 with ascitic hypochromic indices, normal TSH September 14 ferritin 18, iron saturation 15%  Additional studies/ records that were reviewed today include:  Detailed notes provided by Dr. Sandi Mariscal  ASSESSMENT:    1. Coronary  artery calcification of native artery   2. Preoperative cardiovascular examination   3. Essential hypertension   4. Type 2 diabetes mellitus with other circulatory complication, with long-term current use of insulin ([place])   5. Hyperlipidemia with low HDL   6. Carotid artery disease without cerebral infarction ([place])   7. Tobacco abuse   8. Iron deficiency anemia due to chronic blood loss      PLAN:  In order of problems listed above:  1. Coronary calcification: It is very likely that Mrs. Thelen has some coronary artery disease and stenoses. However, in the absence of depressed left ventricular systolic function or angina pectoris there is really no indication for revascularization. I would continue to focus on risk factor medication. I'm very pleased with her weight loss. She is no longer obese. Diabetes control is excellent and her lipid profile is for the most part acceptable, with the exception of low HDL. It is probably worthwhile to reevaluate her left ventricular ejection fraction by echo, since the last nuclear study suggested that they was borderline depressed. Will order echocardiogram. Unless left ventricular systolic function is clearly depressed, this would have little impact on her perioperative risk evaluation.  2. Preop cardiac exam: She has reasonably good functional status and does not have angina pectoris or serious ECG abnormalities. I don't think further cardiology evaluation needs to be performed before the planned lumbar spine surgery. Her risk of major cardiovascular complications is low. 3. HTN: Blood pressure control has been generally good, but occasionally with elevated blood pressure, including today. There is some correlation with her pain level. I did not make any changes to her medications today, but if necessary, consider switching from losartan to a more potent angiotensin receptor blocker such as irbesartan or olmesartan. 4. DM: Excellent control, may be able to  discontinue insulin. Congratulated on weight loss 5. HLP: As above, the major remaining problem is low HDL cholesterol, which is expected to improve with further weight loss and more physical activity after her back problems improve. 6. Carotid stenosis: Patent right endarterectomy site and stable mild left-sided obstruction. 7. Tobacco abuse: It sounds like she is really trying hard to quit smoking and the fact that her husband has decided to do the same thing is definitely a positive development. Strongly encouraged in her efforts. 8. Iron deficiency anemia: Workup in progress with Dr. Carlean Purl. Known to have angiodysplasia of the right colon. If aspirin needs to be interrupted temporarily, I don't think this would be a higher risk from a cardiac point of view. However, in the long run she really needs aspirin therapy due to her carotid disease and potential coronary problems.    Medication Adjustments/Labs and Tests Ordered: Current medicines are reviewed at length with the patient today.  Concerns regarding medicines are outlined above.  Medication changes, Labs and Tests ordered today are listed in the Patient Instructions below. Patient Instructions  Dr Sallyanne Kuster recommends that you follow-up with him as needed.    Signed, Sanda Klein, MD  10/07/2016 7:42 PM    [address], [place], [place]  [address] Phone: [phone]; Fax: [phone]

## 2016-10-06 NOTE — Patient Instructions (Signed)
Dr Croitoru recommends that you follow-up with him as needed. 

## 2016-10-07 ENCOUNTER — Encounter: Payer: Self-pay | Admitting: Cardiovascular Disease

## 2016-10-07 DIAGNOSIS — I25118 Atherosclerotic heart disease of native coronary artery with other forms of angina pectoris: Secondary | ICD-10-CM | POA: Insufficient documentation

## 2016-10-07 DIAGNOSIS — D509 Iron deficiency anemia, unspecified: Secondary | ICD-10-CM | POA: Insufficient documentation

## 2016-10-09 ENCOUNTER — Encounter: Payer: Medicare Other | Admitting: Neurology

## 2016-10-10 ENCOUNTER — Telehealth: Payer: Self-pay

## 2016-10-10 DIAGNOSIS — I2510 Atherosclerotic heart disease of native coronary artery without angina pectoris: Secondary | ICD-10-CM

## 2016-10-10 NOTE — Telephone Encounter (Signed)
-----   Message from Sanda Klein, MD sent at 10/07/2016  7:35 PM EDT ----- Please tell her that I was reviewing her records and noticed some discrepancy in our assessment of heart pumping function. I don't think she needs another stress test but I would like her to have an echocardiogram. We'll try to get it before she has her back surgery

## 2016-10-10 NOTE — Telephone Encounter (Signed)
lmtcb

## 2016-10-14 ENCOUNTER — Other Ambulatory Visit (HOSPITAL_COMMUNITY)
Admission: RE | Admit: 2016-10-14 | Discharge: 2016-10-14 | Disposition: A | Discharge status: Home / Self Care | Payer: Medicare Other | Source: Ambulatory Visit | Attending: Physician Assistant | Admitting: Physician Assistant

## 2016-10-14 ENCOUNTER — Ambulatory Visit
Admission: RE | Admit: 2016-10-14 | Discharge: 2016-10-14 | Disposition: A | Discharge status: Home / Self Care | Payer: Medicare Other | Source: Ambulatory Visit | Attending: Family Medicine | Admitting: Family Medicine

## 2016-10-14 DIAGNOSIS — E041 Nontoxic single thyroid nodule: Secondary | ICD-10-CM | POA: Diagnosis present

## 2016-10-14 NOTE — Procedures (Signed)
Using direct ultrasound guidance, 4 passes were made using needles into the nodule within the right lobe of the thyroid.   Ultrasound was used to confirm needle placements on all occasions.   Specimens were sent to Pathology for analysis.   Harlea Goetzinger S Samanthajo Payano PA-C 10/14/2016 3:27 PM

## 2016-10-15 ENCOUNTER — Encounter: Payer: Self-pay | Admitting: Neurology

## 2016-10-20 ENCOUNTER — Encounter: Payer: Self-pay | Admitting: Internal Medicine

## 2016-10-20 ENCOUNTER — Ambulatory Visit (AMBULATORY_SURGERY_CENTER): Payer: Medicare Other | Admitting: Internal Medicine

## 2016-10-20 VITALS — BP 119/61 | HR 57 | Temp 98.0°F | Resp 13 | Ht 65.0 in | Wt 180.0 lb

## 2016-10-20 DIAGNOSIS — D509 Iron deficiency anemia, unspecified: Secondary | ICD-10-CM

## 2016-10-20 LAB — GLUCOSE, CAPILLARY
GLUCOSE-CAPILLARY: 93 mg/dL (ref 65–99)
Glucose-Capillary: 115 mg/dL — ABNORMAL HIGH (ref 65–99)

## 2016-10-20 MED ORDER — SODIUM CHLORIDE 0.9 % IV SOLN: 500.0000 mL | INTRAVENOUS | Status: DC

## 2016-10-20 NOTE — Progress Notes (Signed)
A/ox3 pleased with MAC, report to Jettie Pagan; RN

## 2016-10-20 NOTE — Op Note (Signed)
[place] Patient Name: Lauren [name] Procedure Date: 10/20/2016 4:01 PM MRN: [id number] Endoscopist: Gatha Mayer , MD Age: 66 Referring MD:  Date of Birth: [birth year] Gender: Female Account #: [id number] Procedure:                Upper GI endoscopy Indications:              Iron deficiency anemia Medicines:                Propofol per Anesthesia, Monitored Anesthesia Care Procedure:                Pre-Anesthesia Assessment:                           - Prior to the procedure, a History and Physical                            was performed, and patient medications and                            allergies were reviewed. The patient's tolerance of                            previous anesthesia was also reviewed. The risks                            and benefits of the procedure and the sedation                            options and risks were discussed with the patient.                            All questions were answered, and informed consent                            was obtained. Prior Anticoagulants: The patient                            last took aspirin on the day of the procedure.                            After reviewing the risks and benefits, the patient                            was deemed in satisfactory condition to undergo the                            procedure.                           After obtaining informed consent, the endoscope was                            passed under direct vision. Throughout the  procedure, the patient's blood pressure, pulse, and                            oxygen saturations were monitored continuously. The                            Model GIF-[id number]) scope was introduced                            through the mouth, and advanced to the second part                            of duodenum. The upper GI endoscopy was                            accomplished without difficulty. The  patient                            tolerated the procedure well. Scope In: Scope Out: Findings:                 The esophagus was normal.                           Diffuse mildly erythematous mucosa without bleeding                            was found in the gastric antrum.                           The exam of the stomach was otherwise normal.                           The examined duodenum was normal. Complications:            No immediate complications. Estimated Blood Loss:     Estimated blood loss: none. Impression:               - Normal esophagus.                           - Erythematous mucosa in the antrum.                           - Normal examined duodenum.                           - No specimens collected. Recommendation:           - Patient has a contact number available for                            emergencies. The signs and symptoms of potential                            delayed complications were discussed with the  patient. Return to normal activities tomorrow.                            Written discharge instructions were provided to the                            patient.                           - Resume previous diet.                           - Continue present medications.                           -                           FOLLOW-UP WITH DR. BLOMGREN                           TAKE IRON SUPPLEMENTS - ORAL VS PARENTERAL                           IF FAILS TO HOLD HGB NL THEN CONSIDER COLONOSCOPY                            AND ABLATION OF AVM SEEN IN PAST Gatha Mayer, MD 10/20/2016 4:19:24 PM This report has been signed electronically.

## 2016-10-20 NOTE — Progress Notes (Signed)
Patient stated she fell 2 days ago and did not see a doctor. Hurt left arm and face. Pain scale of 8 to left arm . No bruising at sight. Presented with a sling to left arm. Able to move fingers and squeezed Nurse hand. Dr. Carlean Purl notified and stated will do procedure.

## 2016-10-20 NOTE — Patient Instructions (Addendum)
No problems here.  Please go back to Dr. Sandi Mariscal about the anemia - take iron supplements.  If that fails to work then may need to repeat a colonoscopy.  I appreciate the opportunity to care for you. Gatha Mayer, MD, FACG  YOU HAD AN ENDOSCOPIC PROCEDURE TODAY AT [facility]:   Refer to the procedure report that was given to you for any specific questions about what was found during the examination.  If the procedure report does not answer your questions, please call your gastroenterologist to clarify.  If you requested that your care partner not be given the details of your procedure findings, then the procedure report has been included in a sealed envelope for you to review at your convenience later.  YOU SHOULD EXPECT: Some feelings of bloating in the abdomen. Passage of more gas than usual.  Walking can help get rid of the air that was put into your GI tract during the procedure and reduce the bloating. If you had a lower endoscopy (such as a colonoscopy or flexible sigmoidoscopy) you may notice spotting of blood in your stool or on the toilet paper. If you underwent a bowel prep for your procedure, you may not have a normal bowel movement for a few days.  Please Note:  You might notice some irritation and congestion in your nose or some drainage.  This is from the oxygen used during your procedure.  There is no need for concern and it should clear up in a day or so.  SYMPTOMS TO REPORT IMMEDIATELY:    Following upper endoscopy (EGD)  Vomiting of blood or coffee ground material  New chest pain or pain under the shoulder blades  Painful or persistently difficult swallowing  New shortness of breath  Fever of 100F or higher  Black, tarry-looking stools  For urgent or emergent issues, a gastroenterologist can be reached at any hour by calling [phone].   DIET:  We do recommend a small meal at first, but then you may proceed to your regular diet.   Drink plenty of fluids but you should avoid alcoholic beverages for 24 hours.  ACTIVITY:  You should plan to take it easy for the rest of today and you should NOT DRIVE or use heavy machinery until tomorrow (because of the sedation medicines used during the test).    FOLLOW UP: Our staff will call the number listed on your records the next business day following your procedure to check on you and address any questions or concerns that you may have regarding the information given to you following your procedure. If we do not reach you, we will leave a message.  However, if you are feeling well and you are not experiencing any problems, there is no need to return our call.  We will assume that you have returned to your regular daily activities without incident.  If any biopsies were taken you will be contacted by phone or by letter within the next 1-3 weeks.  Please call us at [phone] if you have not heard about the biopsies in 3 weeks.    SIGNATURES/CONFIDENTIALITY: You and/or your care partner have signed paperwork which will be entered into your electronic medical record.  These signatures attest to the fact that that the information above on your After Visit Summary has been reviewed and is understood.  Full responsibility of the confidentiality of this discharge information lies with you and/or your care-partner.  Follow-up with Dr Sandi Mariscal  Take iron supplements orally.  If iron fails to hold hgb then consider colonoscopy and ablation of AVM as seen in past.

## 2016-10-21 ENCOUNTER — Telehealth: Payer: Self-pay | Admitting: *Deleted

## 2016-10-21 NOTE — Telephone Encounter (Signed)
  Follow up Call-  Call back number 10/20/2016 11/13/2014  Post procedure Call Back phone  # [phone]  Permission to leave phone message Yes Yes  Some recent data might be hidden     Patient questions:  Do you have a fever, pain , or abdominal swelling? No. Pain Score  0 *  Have you tolerated food without any problems? Yes.    Have you been able to return to your normal activities? Yes.    Do you have any questions about your discharge instructions: Diet   No. Medications  No. Follow up visit  No.  Do you have questions or concerns about your Care? No.  Actions: * If pain score is 4 or above: No action needed, pain <4.

## 2016-10-27 ENCOUNTER — Encounter (INDEPENDENT_AMBULATORY_CARE_PROVIDER_SITE_OTHER): Payer: Self-pay | Admitting: Orthopedic Surgery

## 2016-10-27 ENCOUNTER — Ambulatory Visit (INDEPENDENT_AMBULATORY_CARE_PROVIDER_SITE_OTHER): Payer: Medicare Other

## 2016-10-27 ENCOUNTER — Ambulatory Visit (INDEPENDENT_AMBULATORY_CARE_PROVIDER_SITE_OTHER): Payer: Medicare Other | Admitting: Orthopedic Surgery

## 2016-10-27 VITALS — Ht 66.0 in | Wt 173.0 lb

## 2016-10-27 DIAGNOSIS — M25512 Pain in left shoulder: Secondary | ICD-10-CM

## 2016-10-27 DIAGNOSIS — M7542 Impingement syndrome of left shoulder: Secondary | ICD-10-CM

## 2016-10-27 MED ORDER — METHYLPREDNISOLONE ACETATE 40 MG/ML IJ SUSP
40.0000 mg | INTRAMUSCULAR | Status: AC | PRN
  Administered 2016-10-27: 40 mg via INTRA_ARTICULAR

## 2016-10-27 MED ORDER — LIDOCAINE HCL 1 % IJ SOLN
5.0000 mL | INTRAMUSCULAR | Status: AC | PRN
  Administered 2016-10-27: 5 mL

## 2016-10-27 NOTE — Progress Notes (Addendum)
Office Visit Note   Patient: Lauren [name]           Date of Birth: [birth year]           MRN: [id number] Visit Date: 10/27/2016              Requested by: [name], MD [address] [place], [place] [address] PCP: Marylene Land, MD   Assessment & Plan: Visit Diagnoses:  1. Acute pain of left shoulder   2. Impingement syndrome of left shoulder     Plan: Will follow up in office as needed. If no better in 4 weeks will return.   Follow-Up Instructions: Return if symptoms worsen or fail to improve.   Orders:  Orders Placed This Encounter  Procedures  . Large Joint Injection/Arthrocentesis  . XR Shoulder Left   Meds ordered this encounter  Medications  . lidocaine (XYLOCAINE) 1 % (with pres) injection 5 mL  . methylPREDNISolone acetate (DEPO-MEDROL) injection 40 mg      Procedures: Large Joint Inj Date/Time: 10/27/2016 11:08 AM Performed by: Suzan Slick Authorized by: Dondra Prader R   Consent Given by:  Patient Site marked: the procedure site was marked   Timeout: prior to procedure the correct patient, procedure, and site was verified   Indications:  Pain and diagnostic evaluation Location:  Shoulder Site:  L subacromial bursa Prep: patient was prepped and draped in usual sterile fashion   Needle Size:  22 G Needle Length:  1.5 inches Ultrasound Guidance: No   Fluoroscopic Guidance: No   Arthrogram: No Medications:  5 mL lidocaine 1 %; 40 mg methylPREDNISolone acetate 40 MG/ML Aspiration Attempted: No   Patient tolerance:  Patient tolerated the procedure well with no immediate complications     Clinical Data: No additional findings.   Subjective: Chief Complaint  Patient presents with  . Left Shoulder - Pain, Injury    Patient is status post fall approximately one week ago. Patient has pain left shoulder radiating down to her elbow. She has since had decreased ROM, she is unable to raise arm above head. She wears shoulder  immobilizer on occasion. She called Dr. Sandi Mariscal her primary care and referred her here.     Review of Systems  Constitutional: Negative for chills and fever.  All other systems reviewed and are negative.    Objective: Vital Signs: Ht 5\' 6"  (1.676 m)   Wt 173 lb (78.5 kg)   BMI 27.92 kg/m   Physical Exam  Left Shoulder Exam   Range of Motion  The patient has normal left shoulder ROM.  Tests  Impingement: positive  Other  Erythema: absent Sensation: normal Pulse: present   Comments:  Shoulder is globally tender, unable to reach behind back due to pain        Specialty Comments:  No specialty comments available.  Imaging: Xr Shoulder Left  Result Date: 10/27/2016 Two views of the left shoulder are negative for fracture.    PMFS History: Patient Active Problem List   Diagnosis Date Noted  . Coronary artery calcification of native artery 10/07/2016  . Iron (Fe) deficiency anemia 10/07/2016  . DDD (degenerative disc disease), lumbar 10/03/2016  . Fatigue 10/03/2016  . Intervertebral disc disease 10/03/2016  . Low back pain 10/03/2016  . Myelopathy ([place]) 10/03/2016  . Prolapsed lumbosacral intervertebral disc 10/03/2016  . Spondylisthesis 10/03/2016  . MRSA (methicillin resistant staph aureus) culture positive 10/10/2015  . Carotid stenosis 08/17/2015  . Preoperative cardiovascular examination 08/10/2015  .  Tobacco abuse 08/10/2015  . 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 Acute osteomyelitis of spine ([place]) 08/28/2014  . Difficult or painful urination 08/28/2014  . H/O Spinal surgery 08/28/2014  . Elevated WBC count 08/28/2014  . Encounter for aftercare for long-term (current) use of antibiotics 08/28/2014  . N&V (nausea and vomiting) 08/28/2014  . Bladder retention 08/28/2014  . Infection of urinary tract 08/28/2014  . DM2 (diabetes mellitus, type 2) ([place]) 06/15/2013  . Ventral hernia 06/15/2013  . COPD (chronic obstructive pulmonary disease) ([place])  06/15/2013  . DJD (degenerative joint disease) of lumbar spine 06/15/2013  . Depression 06/15/2013  . Exertional angina ([place]) 06/15/2013  . Preoperative evaluation 06/15/2013  . HTN (hypertension) 06/15/2013  . Occlusion and stenosis of carotid artery without mention of cerebral infarction 03/02/2012  . Malignant neoplasm of corpus uteri, except isthmus ([place]) 01/19/2012  . Abnormal EKG 12/07/2011  . Essential hypertension, benign 12/07/2011  . Hyperlipidemia with low HDL 12/07/2011  . Carotid artery disease without cerebral infarction ([place]) 12/07/2011   Past Medical History:  Diagnosis Date  . Anemia   . Anxiety   . CAD (coronary artery disease)   . Carotid artery occlusion   . Cataract    bil cateracts removed  . COPD (chronic obstructive pulmonary disease) ([place])   . Cough   . DDD (degenerative disc disease), lumbar   . Depression   . Diabetes mellitus   . Endometrial ca [facility])    endometrial ca dx 4/11  . GERD (gastroesophageal reflux disease)   . Gout 2015  . Hernia of flank   . Hyperlipidemia   . Hypertension   . Insomnia   . OAB (overactive bladder)   . Osteoarthritis   . Peripheral neuropathy ([place])    following chemotherapy  . Psoriasis   . Pulmonary nodule    since 2016  . Radiation 05/01/2011-06/11/11   5600 cGy 28 fxs/periaortic  . Stroke ([place])    tia  . TIA (transient ischemic attack) 2015  . 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 Uterine cancer ([place])   . Ventral hernia    postoperative since 2009    Family History  Problem Relation Age of Onset  . Stroke Mother   . Irritable bowel syndrome Mother   . Heart attack Father 20    died  . Heart disease Father     Heart Disease before age 30, rheumatic heart disease  . Hyperlipidemia Father   . Hypertension Father   . Diabetes Daughter   . Hypertension Daughter   . Irritable bowel syndrome Daughter   . Colon cancer Neg Hx   . Esophageal cancer Neg Hx   . Pancreatic cancer  Neg Hx   . Rectal cancer Neg Hx   . Stomach cancer Neg Hx     Past Surgical History:  Procedure Laterality Date  . ABDOMINAL HYSTERECTOMY  2009   for ca  . BACK SURGERY  Aug/sept 2014   3 back surgeries  (Aug 2014 & Sept 2014)  . CHOLECYSTECTOMY     biliteral  . COLONOSCOPY  11/13/2014   negative except for a small cecal AVM and 1 polyp  . ENDARTERECTOMY Right 08/17/2015   Procedure: Right Carotid Endartarectomy with patch Angioplasty ;  Surgeon: Mal Misty, MD;  Location: [facility];  Service: Vascular;  Laterality: Right;  . EYE SURGERY Right Aug. 2013   Cataract, lens implant  . EYE SURGERY Left Sept. 2013   Cataract, lens  implant  . I & D of lumbar fusion /removal of hardware for chronic infection  Jan. 8, 2015  . right carotid endarterectomy  2016  . SPINE SURGERY     Social History   Occupational History  . disabled    Social History Main Topics  . Smoking status: Light Tobacco Smoker    Packs/day: 1.00    Years: 40.00    Types: Cigarettes  . Smokeless tobacco: Never Used  . Alcohol use No  . Drug use: No  . Sexual activity: No          Office Visit Note   Patient: Lauren [name]           Date of Birth: 06-08-1951           MRN: [id number] Visit Date:               Requested by: [name], MD [address] [place], [place] [address] PCP: Marylene Land, MD   Assessment & Plan: Visit Diagnoses:  1. Acute pain of left shoulder   2. Impingement syndrome of left shoulder     Plan:  Follow-Up Instructions: Return if symptoms worsen or fail to improve.   Orders:  Orders Placed This Encounter  Procedures  . Large Joint Injection/Arthrocentesis  . XR Shoulder Left   Meds ordered this encounter  Medications  . lidocaine (XYLOCAINE) 1 % (with pres) injection 5 mL  . methylPREDNISolone acetate (DEPO-MEDROL) injection 40 mg      Procedures: No procedures performed   Clinical Data: No additional findings.   Subjective: Chief  Complaint  Patient presents with  . Left Shoulder - Pain, Injury    HPI  Review of Systems   Objective: Vital Signs: Ht 5\' 6"  (1.676 m)   Wt 173 lb (78.5 kg)   BMI 27.92 kg/m   Physical Exam  Ortho Exam  Specialty Comments:  No specialty comments available.  Imaging: Xr Shoulder Left  Result Date: 10/27/2016 Two views of the left shoulder are negative for fracture.    PMFS History: Patient Active Problem List   Diagnosis Date Noted  . Coronary artery calcification of native artery 10/07/2016  . Iron (Fe) deficiency anemia 10/07/2016  . DDD (degenerative disc disease), lumbar 10/03/2016  . Fatigue 10/03/2016  . Intervertebral disc disease 10/03/2016  . Low back pain 10/03/2016  . Myelopathy ([place]) 10/03/2016  . Prolapsed lumbosacral intervertebral disc 10/03/2016  . Spondylisthesis 10/03/2016  . MRSA (methicillin resistant staph aureus) culture positive 10/10/2015  . Carotid stenosis 08/17/2015  . Preoperative cardiovascular examination 08/10/2015  . Tobacco abuse 08/10/2015  . 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 Acute osteomyelitis of spine ([place]) 08/28/2014  . Difficult or painful urination 08/28/2014  . H/O Spinal surgery 08/28/2014  . Elevated WBC count 08/28/2014  . Encounter for aftercare for long-term (current) use of antibiotics 08/28/2014  . N&V (nausea and vomiting) 08/28/2014  . Bladder retention 08/28/2014  . Infection of urinary tract 08/28/2014  . DM2 (diabetes mellitus, type 2) ([place]) 06/15/2013  . Ventral hernia 06/15/2013  . COPD (chronic obstructive pulmonary disease) ([place]) 06/15/2013  . DJD (degenerative joint disease) of lumbar spine 06/15/2013  . Depression 06/15/2013  . Exertional angina ([place]) 06/15/2013  . Preoperative evaluation 06/15/2013  . HTN (hypertension) 06/15/2013  . Occlusion and stenosis of carotid artery without mention of cerebral infarction 03/02/2012  . Malignant neoplasm of corpus uteri, except isthmus ([place]) 01/19/2012   . Abnormal EKG 12/07/2011  . Essential hypertension, benign 12/07/2011  .  Hyperlipidemia with low HDL 12/07/2011  . Carotid artery disease without cerebral infarction ([place]) 12/07/2011   Past Medical History:  Diagnosis Date  . Anemia   . Anxiety   . CAD (coronary artery disease)   . Carotid artery occlusion   . Cataract    bil cateracts removed  . COPD (chronic obstructive pulmonary disease) ([place])   . Cough   . DDD (degenerative disc disease), lumbar   . Depression   . Diabetes mellitus   . Endometrial ca [facility])    endometrial ca dx 4/11  . GERD (gastroesophageal reflux disease)   . Gout 2015  . Hernia of flank   . Hyperlipidemia   . Hypertension   . Insomnia   . OAB (overactive bladder)   . Osteoarthritis   . Peripheral neuropathy ([place])    following chemotherapy  . Psoriasis   . Pulmonary nodule    since 2016  . Radiation 05/01/2011-06/11/11   5600 cGy 28 fxs/periaortic  . Stroke ([place])    tia  . TIA (transient ischemic attack) 2015  . 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 Uterine cancer ([place])   . Ventral hernia    postoperative since 2009    Family History  Problem Relation Age of Onset  . Stroke Mother   . Irritable bowel syndrome Mother   . Heart attack Father 82    died  . Heart disease Father     Heart Disease before age 76, rheumatic heart disease  . Hyperlipidemia Father   . Hypertension Father   . Diabetes Daughter   . Hypertension Daughter   . Irritable bowel syndrome Daughter   . Colon cancer Neg Hx   . Esophageal cancer Neg Hx   . Pancreatic cancer Neg Hx   . Rectal cancer Neg Hx   . Stomach cancer Neg Hx     Past Surgical History:  Procedure Laterality Date  . ABDOMINAL HYSTERECTOMY  2009   for ca  . BACK SURGERY  Aug/sept 2014   3 back surgeries  (Aug 2014 & Sept 2014)  . CHOLECYSTECTOMY     biliteral  . COLONOSCOPY  11/13/2014   negative except for a small cecal AVM and 1 polyp  . ENDARTERECTOMY Right  08/17/2015   Procedure: Right Carotid Endartarectomy with patch Angioplasty ;  Surgeon: Mal Misty, MD;  Location: [facility];  Service: Vascular;  Laterality: Right;  . EYE SURGERY Right Aug. 2013   Cataract, lens implant  . EYE SURGERY Left Sept. 2013   Cataract, lens implant  . I & D of lumbar fusion /removal of hardware for chronic infection  Jan. 8, 2015  . right carotid endarterectomy  2016  . SPINE SURGERY     Social History   Occupational History  . disabled    Social History Main Topics  . Smoking status: Light Tobacco Smoker    Packs/day: 1.00    Years: 40.00    Types: Cigarettes  . Smokeless tobacco: Never Used  . Alcohol use No  . Drug use: No  . Sexual activity: No

## 2016-11-26 ENCOUNTER — Encounter (INDEPENDENT_AMBULATORY_CARE_PROVIDER_SITE_OTHER): Payer: Self-pay | Admitting: Neurology

## 2016-11-26 ENCOUNTER — Ambulatory Visit (INDEPENDENT_AMBULATORY_CARE_PROVIDER_SITE_OTHER): Payer: Medicare Other | Admitting: Neurology

## 2016-11-26 DIAGNOSIS — E1142 Type 2 diabetes mellitus with diabetic polyneuropathy: Secondary | ICD-10-CM

## 2016-11-26 DIAGNOSIS — Z0289 Encounter for other administrative examinations: Secondary | ICD-10-CM

## 2016-11-26 NOTE — Procedures (Signed)
HISTORY:  Lauren [name] is a 65 year old patient with a history of diabetes and a history of prior lumbosacral spine surgery. The patient has significant low back pain and some pain down both legs. She reports significant numbness and tingling of both feet, with bilateral foot drops. She has a gait disturbance. The patient is being evaluated for a possible peripheral neuropathy or a lumbosacral radiculopathy.  NERVE CONDUCTION STUDIES:  Nerve conduction studies were done on the left upper extremity. The distal motor latency for the left median nerve was prolonged with a normal motor amplitude. The F wave latency and nerve conduction velocity for this nerve was normal. The left median sensory latency was prolonged.  Nerve conduction studies were performed on both lower extremities. The studies of the peroneal nerves were unobtainable bilaterally. The distal motor latencies for the posterior tibial nerves were prolonged bilaterally with low motor amplitudes seen for these nerves bilaterally. The nerve conduction velocities for the posterior tibial nerves were normal bilaterally. The H reflex latencies were unobtainable bilaterally. The sural and peroneal sensory latencies were unobtainable bilaterally.  EMG STUDIES:  EMG study was performed on the left lower extremity:  The tibialis anterior muscle reveals no voluntary motor units with no recruitment. 3+ fibrillations and positive waves were seen. The peroneus tertius muscle reveals no voluntary motor units with no recruitment. 3+ fibrillations and positive waves were seen. The medial gastrocnemius muscle reveals 1 to 3K motor units with markedly decreased recruitment. 3+ fibrillations and positive waves were seen. The vastus lateralis muscle reveals 2 to 4K motor units with slightly decreased recruitment. No fibrillations or positive waves were seen. The iliopsoas muscle reveals 2 to 4K motor units with full recruitment. No fibrillations  or positive waves were seen. The biceps femoris muscle (long head) reveals 2 to 3K motor units with slightly decreased recruitment. 1+ fibrillations and positive waves were seen. The lumbosacral paraspinal muscles were tested at 3 levels, and revealed no abnormalities of insertional activity at all 3 levels tested. There was good relaxation.  EMG study was performed on the right lower extremity:  The tibialis anterior muscle reveals 1 to 2K motor units with markedly decreased recruitment. 3+ fibrillations and positive waves were seen. The peroneus tertius muscle reveals 1 to 2K motor units with markedly decreased recruitment. 3+ fibrillations and positive waves were seen. The medial gastrocnemius muscle reveals 1 to 3K motor units with decreased recruitment. 2+ fibrillations and positive waves were seen. The vastus lateralis muscle reveals 2 to 5K motor units with decreased recruitment. No fibrillations or positive waves were seen. The iliopsoas muscle reveals 2 to 4K motor units with full recruitment. No fibrillations or positive waves were seen. The biceps femoris muscle (long head) reveals 2 to 3K motor units with slightly decreased recruitment. No fibrillations or positive waves were seen. The lumbosacral paraspinal muscles were not tested, the patient was unable to roll on her left side.   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 There is an overlying mild left carpal tunnel syndrome. EMG evaluation of the left lower extremity shows distal acute and chronic signs of denervation consistent with the diagnosis of peripheral neuropathy, there may be an overlying mild acute L5 radiculopathy. EMG evaluation of the right lower extremity shows distal acute and chronic signs of denervation consistent with a severe peripheral neuropathy. An overlying lumbosacral  radiculopathy on this side cannot  be confirmed.  Jill Alexanders MD 11/26/2016 4:59 PM  [facility] [address] [place], [place] [address]  Phone [phone] Fax [phone]

## 2016-12-01 ENCOUNTER — Encounter: Payer: Self-pay | Admitting: Vascular Surgery

## 2016-12-01 ENCOUNTER — Other Ambulatory Visit: Payer: Self-pay

## 2016-12-01 DIAGNOSIS — I6529 Occlusion and stenosis of unspecified carotid artery: Secondary | ICD-10-CM

## 2016-12-04 ENCOUNTER — Ambulatory Visit (HOSPITAL_COMMUNITY): Payer: Medicare Other | Attending: Cardiology

## 2016-12-04 ENCOUNTER — Other Ambulatory Visit: Payer: Self-pay

## 2016-12-04 DIAGNOSIS — I2510 Atherosclerotic heart disease of native coronary artery without angina pectoris: Secondary | ICD-10-CM | POA: Diagnosis not present

## 2016-12-05 ENCOUNTER — Encounter (HOSPITAL_COMMUNITY): Payer: Medicare Other

## 2016-12-05 ENCOUNTER — Ambulatory Visit: Payer: Medicare Other | Admitting: Vascular Surgery

## 2016-12-15 ENCOUNTER — Telehealth (INDEPENDENT_AMBULATORY_CARE_PROVIDER_SITE_OTHER): Payer: Self-pay | Admitting: Orthopedic Surgery

## 2016-12-15 NOTE — Telephone Encounter (Signed)
Patient's spouse calling to schedule appt for injection in the L shoulder.  He states the last injection was given  Oct 30th, 2017 by duda's assistant,  but does not know what type of injection and is unsure how often she was told she could have them.  Please advise if injection is not considered. Appt has been made this Wednesday Dec 20th@ 2:45

## 2016-12-16 NOTE — Telephone Encounter (Signed)
This is fine will see patient tomorrow for appointment.

## 2016-12-17 ENCOUNTER — Ambulatory Visit (INDEPENDENT_AMBULATORY_CARE_PROVIDER_SITE_OTHER): Payer: Medicare Other | Admitting: Orthopedic Surgery

## 2016-12-17 VITALS — Ht 66.0 in | Wt 173.0 lb

## 2016-12-17 DIAGNOSIS — M7542 Impingement syndrome of left shoulder: Secondary | ICD-10-CM | POA: Insufficient documentation

## 2016-12-17 MED ORDER — LIDOCAINE HCL 1 % IJ SOLN
5.0000 mL | INTRAMUSCULAR | Status: AC | PRN
  Administered 2016-12-17: 5 mL

## 2016-12-17 MED ORDER — METHYLPREDNISOLONE ACETATE 40 MG/ML IJ SUSP
40.0000 mg | INTRAMUSCULAR | Status: AC | PRN
  Administered 2016-12-17: 40 mg via INTRA_ARTICULAR

## 2016-12-17 NOTE — Progress Notes (Signed)
Office Visit Note   Patient: Lauren [name]           Date of Birth: 1951/05/13           MRN: [id number] Visit Date: 12/17/2016              Requested by: [name], MD [address] [place], [place] [address] PCP: Marylene Land, MD   Assessment & Plan: Visit Diagnoses:  1. Impingement syndrome of left shoulder     Plan: Follow-up as needed. Patient states that she is trying to reschedule surgery for her lumbar spine she states that this may be her fifth surgical procedure she states she previously had infected hardware. Discussed that this shoulder injection does not help that we would need to get an MRI scan to see if she has any full thickness rotator cuff tear and could possibly benefit from arthroscopic intervention. We will hold on surgery on her shoulder until she has stabilized from her back.  Follow-Up Instructions: Return if symptoms worsen or fail to improve.   Orders:  No orders of the defined types were placed in this encounter.  No orders of the defined types were placed in this encounter.     Procedures: Large Joint Inj Date/Time: 12/17/2016 3:55 PM Performed by: Marjory Meints V Authorized by: Newt Minion   Consent Given by:  Patient Site marked: the procedure site was marked   Timeout: prior to procedure the correct patient, procedure, and site was verified   Indications:  Pain and diagnostic evaluation Location:  Shoulder Site:  L subacromial bursa Prep: patient was prepped and draped in usual sterile fashion   Needle Size:  22 G Needle Length:  1.5 inches Approach:  Posterior Ultrasound Guidance: No   Fluoroscopic Guidance: No   Arthrogram: No   Medications:  5 mL lidocaine 1 %; 40 mg methylPREDNISolone acetate 40 MG/ML Aspiration Attempted: No   Patient tolerance:  Patient tolerated the procedure well with no immediate complications     Clinical Data: No additional findings.   Subjective: Chief Complaint  Patient  presents with  . Left Shoulder - Pain    Pt is having left shoulder pain and had an injection in October and this was helpful and would like to have another one today.    Review of Systems   Objective: Vital Signs: Ht 5\' 6"  (1.676 m)   Wt 173 lb (78.5 kg)   BMI 27.92 kg/m   Physical Exam examination patient is alert oriented no adenopathy well-dressed normal affect normal respiratory effort she has antalgic gait and uses a rolling walker she states she has multiple falls and this is symptoms from a fall on her left shoulder October 30. Initial injection provided 30% relief for a week. Examination she has abduction and flexion to 90 external rotation 45 internal rotation of 45. She has pain with Neer and Hawkins impingement test pain with drop arm test.  Ortho Exam  Specialty Comments:  No specialty comments available.  Imaging: No results found.   PMFS History: Patient Active Problem List   Diagnosis Date Noted  . Impingement syndrome of left shoulder 12/17/2016  . Coronary artery calcification of native artery 10/07/2016  . Iron (Fe) deficiency anemia 10/07/2016  . DDD (degenerative disc disease), lumbar 10/03/2016  . Fatigue 10/03/2016  . Intervertebral disc disease 10/03/2016  . Low back pain 10/03/2016  . Myelopathy ([place]) 10/03/2016  . Prolapsed lumbosacral intervertebral disc 10/03/2016  . Spondylisthesis 10/03/2016  .  MRSA (methicillin resistant staph aureus) culture positive 10/10/2015  . Carotid stenosis 08/17/2015  . Preoperative cardiovascular examination 08/10/2015  . Tobacco abuse 08/10/2015  . 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 Acute osteomyelitis of spine ([place]) 08/28/2014  . Difficult or painful urination 08/28/2014  . H/O Spinal surgery 08/28/2014  . Elevated WBC count 08/28/2014  . Encounter for aftercare for long-term (current) use of antibiotics 08/28/2014  . N&V (nausea and vomiting) 08/28/2014  . Bladder retention 08/28/2014  . Infection of  urinary tract 08/28/2014  . DM2 (diabetes mellitus, type 2) ([place]) 06/15/2013  . Ventral hernia 06/15/2013  . COPD (chronic obstructive pulmonary disease) ([place]) 06/15/2013  . DJD (degenerative joint disease) of lumbar spine 06/15/2013  . Depression 06/15/2013  . Exertional angina ([place]) 06/15/2013  . Preoperative evaluation 06/15/2013  . HTN (hypertension) 06/15/2013  . Occlusion and stenosis of carotid artery without mention of cerebral infarction 03/02/2012  . Malignant neoplasm of corpus uteri, except isthmus ([place]) 01/19/2012  . Abnormal EKG 12/07/2011  . Essential hypertension, benign 12/07/2011  . Hyperlipidemia with low HDL 12/07/2011  . Carotid artery disease without cerebral infarction ([place]) 12/07/2011   Past Medical History:  Diagnosis Date  . Anemia   . Anxiety   . CAD (coronary artery disease)   . Carotid artery occlusion   . Cataract    bil cateracts removed  . COPD (chronic obstructive pulmonary disease) ([place])   . Cough   . DDD (degenerative disc disease), lumbar   . Depression   . Diabetes mellitus   . Endometrial ca [facility])    endometrial ca dx 4/11  . GERD (gastroesophageal reflux disease)   . Gout 2015  . Hernia of flank   . Hyperlipidemia   . Hypertension   . Insomnia   . OAB (overactive bladder)   . Osteoarthritis   . Peripheral neuropathy ([place])    following chemotherapy  . Psoriasis   . Pulmonary nodule    since 2016  . Radiation 05/01/2011-06/11/11   5600 cGy 28 fxs/periaortic  . Stroke ([place])    tia  . TIA (transient ischemic attack) 2015  . 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 Uterine cancer ([place])   . Ventral hernia    postoperative since 2009    Family History  Problem Relation Age of Onset  . Stroke Mother   . Irritable bowel syndrome Mother   . Heart attack Father 75    died  . Heart disease Father     Heart Disease before age 34, rheumatic heart disease  . Hyperlipidemia Father   . Hypertension Father   .  Diabetes Daughter   . Hypertension Daughter   . Irritable bowel syndrome Daughter   . Colon cancer Neg Hx   . Esophageal cancer Neg Hx   . Pancreatic cancer Neg Hx   . Rectal cancer Neg Hx   . Stomach cancer Neg Hx     Past Surgical History:  Procedure Laterality Date  . ABDOMINAL HYSTERECTOMY  2009   for ca  . BACK SURGERY  Aug/sept 2014   3 back surgeries  (Aug 2014 & Sept 2014)  . CHOLECYSTECTOMY     biliteral  . COLONOSCOPY  11/13/2014   negative except for a small cecal AVM and 1 polyp  . ENDARTERECTOMY Right 08/17/2015   Procedure: Right Carotid Endartarectomy with patch Angioplasty ;  Surgeon: Mal Misty, MD;  Location: [place];  Service: Vascular;  Laterality: Right;  .  EYE SURGERY Right Aug. 2013   Cataract, lens implant  . EYE SURGERY Left Sept. 2013   Cataract, lens implant  . I & D of lumbar fusion /removal of hardware for chronic infection  Jan. 8, 2015  . right carotid endarterectomy  2016  . SPINE SURGERY     Social History   Occupational History  . disabled    Social History Main Topics  . Smoking status: Light Tobacco Smoker    Packs/day: 1.00    Years: 40.00    Types: Cigarettes  . Smokeless tobacco: Never Used  . Alcohol use No  . Drug use: No  . Sexual activity: No

## 2017-01-12 ENCOUNTER — Encounter: Payer: Self-pay | Admitting: Internal Medicine

## 2017-01-26 ENCOUNTER — Encounter: Payer: Self-pay | Admitting: Vascular Surgery

## 2017-01-30 ENCOUNTER — Ambulatory Visit (INDEPENDENT_AMBULATORY_CARE_PROVIDER_SITE_OTHER): Payer: Medicare Other | Admitting: Vascular Surgery

## 2017-01-30 ENCOUNTER — Encounter: Payer: Self-pay | Admitting: Vascular Surgery

## 2017-01-30 ENCOUNTER — Ambulatory Visit (HOSPITAL_COMMUNITY)
Admission: RE | Admit: 2017-01-30 | Discharge: 2017-01-30 | Disposition: A | Discharge status: Home / Self Care | Payer: Medicare Other | Source: Ambulatory Visit | Attending: Family | Admitting: Family

## 2017-01-30 VITALS — BP 111/67 | HR 77 | Temp 98.7°F | Resp 20 | Ht 65.5 in | Wt 167.5 lb

## 2017-01-30 DIAGNOSIS — I6523 Occlusion and stenosis of bilateral carotid arteries: Secondary | ICD-10-CM

## 2017-01-30 DIAGNOSIS — I6521 Occlusion and stenosis of right carotid artery: Secondary | ICD-10-CM | POA: Diagnosis not present

## 2017-01-30 DIAGNOSIS — I6529 Occlusion and stenosis of unspecified carotid artery: Secondary | ICD-10-CM | POA: Diagnosis present

## 2017-01-30 DIAGNOSIS — Z48812 Encounter for surgical aftercare following surgery on the circulatory system: Secondary | ICD-10-CM

## 2017-01-30 LAB — VAS US CAROTID
LCCAPDIAS: 17 cm/s
LEFT ECA DIAS: -18 cm/s
LICADDIAS: -29 cm/s
LICADSYS: -90 cm/s
LICAPDIAS: -21 cm/s
LICAPSYS: -72 cm/s
Left CCA dist dias: 21 cm/s
Left CCA dist sys: 89 cm/s
Left CCA prox sys: 104 cm/s
RIGHT CCA MID DIAS: 18 cm/s
RIGHT ECA DIAS: -9 cm/s
Right CCA prox dias: 18 cm/s
Right CCA prox sys: 79 cm/s
Right cca dist sys: -105 cm/s

## 2017-01-30 NOTE — Addendum Note (Signed)
Addended by: Lianne Cure A on: 01/30/2017 02:22 PM   Modules accepted: Orders

## 2017-01-30 NOTE — Progress Notes (Signed)
Vitals:   01/30/17 1200 01/30/17 1205 01/30/17 1213  BP: 139/72 132/68 111/67  Pulse: 77    Resp: 20    Temp: 98.7 F (37.1 C)    TempSrc: Oral    SpO2: 94%    Weight: 167 lb 8 oz (76 kg)    Height: 5' 5.5" (1.664 m)

## 2017-01-30 NOTE — Progress Notes (Signed)
Patient ID: Lauren [name], female   DOB: 26-Nov-1951, 66 y.o.   MRN: [id number]  Reason for Consult: Carotid (6 mo f/u; hx right CEA 07/2015)   Referred by [name], MD  Subjective:     HPI:  Lauren [name] is a 66 y.o. female with previous right carotid endarterectomy performed on August 2016 for symptomatic high-grade stenosis. She has not had recurrent neurologic symptoms since this and last saw Dr. Kellie Simmering in June of last year. She denies any recent TIA stroke or amaurosis. Duplex performed today. She actually quit smoking 3 weeks ago and is now prepared to have her back surgery by Dr. Yancey Flemings. She is diabetic with hypertension. She does take aspirin and statin daily.  Past Medical History:  Diagnosis Date  . Anemia   . Anxiety   . CAD (coronary artery disease)   . Carotid artery occlusion   . Cataract    bil cateracts removed  . COPD (chronic obstructive pulmonary disease) ([place])   . Cough   . DDD (degenerative disc disease), lumbar   . Depression   . Diabetes mellitus   . Endometrial ca [facility])    endometrial ca dx 4/11  . GERD (gastroesophageal reflux disease)   . Gout 2015  . Hernia of flank   . Hyperlipidemia   . Hypertension   . Insomnia   . OAB (overactive bladder)   . Osteoarthritis   . Peripheral neuropathy ([place])    following chemotherapy  . Psoriasis   . Pulmonary nodule    since 2016  . Radiation 05/01/2011-06/11/11   5600 cGy 28 fxs/periaortic  . Stroke ([place])    tia  . TIA (transient ischemic attack) 2015  . 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 Uterine cancer ([place])   . Ventral hernia    postoperative since 2009   Family History  Problem Relation Age of Onset  . Stroke Mother   . Irritable bowel syndrome Mother   . Heart attack Father 64    died  . Heart disease Father     Heart Disease before age 46, rheumatic heart disease  . Hyperlipidemia Father   . Hypertension Father   . Diabetes Daughter   .  Hypertension Daughter   . Irritable bowel syndrome Daughter   . Colon cancer Neg Hx   . Esophageal cancer Neg Hx   . Pancreatic cancer Neg Hx   . Rectal cancer Neg Hx   . Stomach cancer Neg Hx    Past Surgical History:  Procedure Laterality Date  . ABDOMINAL HYSTERECTOMY  2009   for ca  . BACK SURGERY  Aug/sept 2014   3 back surgeries  (Aug 2014 & Sept 2014)  . CHOLECYSTECTOMY     biliteral  . COLONOSCOPY  11/13/2014   negative except for a small cecal AVM and 1 polyp  . ENDARTERECTOMY Right 08/17/2015   Procedure: Right Carotid Endartarectomy with patch Angioplasty ;  Surgeon: Mal Misty, MD;  Location: [place];  Service: Vascular;  Laterality: Right;  . EYE SURGERY Right Aug. 2013   Cataract, lens implant  . EYE SURGERY Left Sept. 2013   Cataract, lens implant  . I & D of lumbar fusion /removal of hardware for chronic infection  Jan. 8, 2015  . right carotid endarterectomy  2016  . SPINE SURGERY      Short Social History:  Social History  Substance Use Topics  . Smoking status: Light  Tobacco Smoker    Packs/day: 1.00    Years: 40.00    Types: Cigarettes  . Smokeless tobacco: Never Used  . Alcohol use No    Allergies  Allergen Reactions  . Ambien [Zolpidem Tartrate] Other (See Comments)    Per the pt, "it makes me go crazy"  . Sulfa Antibiotics Rash  . Elavil [Amitriptyline Hcl] Other (See Comments)    Gastritis  . Prozac [Fluoxetine Hcl] Anxiety and Other (See Comments)    irritability     Current Outpatient Prescriptions  Medication Sig Dispense Refill  . albuterol (PROVENTIL HFA;VENTOLIN HFA) 108 (90 BASE) MCG/ACT inhaler Inhale 2 puffs into the lungs every 6 (six) hours as needed for wheezing or shortness of breath.    . ALPRAZolam (XANAX) 0.5 MG tablet Take 0.5 mg by mouth at bedtime as needed.      Marland Kitchen aspirin 81 MG tablet Take 81 mg by mouth daily.      Marland Kitchen atorvastatin (LIPITOR) 10 MG tablet Take 10 mg by mouth daily.    . betamethasone dipropionate  (DIPROLENE) 0.05 % ointment Apply topically 2 (two) times daily.    . chlorthalidone (HYGROTON) 25 MG tablet Take 25 mg by mouth every morning.      . Cholecalciferol (VITAMIN D3) 1000 UNITS CAPS Take 1 Units by mouth daily.      . Cyanocobalamin (VITAMIN B-12 CR) 1500 MCG TBCR Take by mouth daily.    Marland Kitchen escitalopram (LEXAPRO) 20 MG tablet Take 20 mg by mouth daily.    Marland Kitchen esomeprazole (NEXIUM) 40 MG capsule Take 40 mg by mouth daily.     . Fluticasone-Salmeterol (ADVAIR) 250-50 MCG/DOSE AEPB Inhale 1 puff into the lungs every 12 (twelve) hours.      . folic acid (FOLVITE) 1 MG tablet Take 1 mg by mouth daily.    Marland Kitchen gabapentin (NEURONTIN) 600 MG tablet Take 600 mg by mouth 2 (two) times daily. Pt may take up to 6 Tabs if needed per day.    Marland Kitchen glucose blood (ACCU-CHEK AVIVA PLUS) test strip 1 each by Other route as needed. Use as instructed     . insulin glargine (LANTUS) 100 UNIT/ML injection Inject 35 Units into the skin daily.     Marland Kitchen losartan (COZAAR) 100 MG tablet Take 100 mg by mouth daily.      . magnesium oxide (MAG-OX) 400 MG tablet Take 400 mg by mouth daily.    . metFORMIN (GLUMETZA) 1000 MG (MOD) 24 hr tablet Take 1,000 mg by mouth 2 (two) times daily with a meal.      . methocarbamol (ROBAXIN) 750 MG tablet Take 750 mg by mouth daily.    . methotrexate (RHEUMATREX) 2.5 MG tablet Take 15 mg by mouth once a week. Caution:Chemotherapy. Protect from light.    . metoprolol (TOPROL-XL) 100 MG 24 hr tablet Take 50 mg by mouth daily after breakfast.     . Multiple Vitamin (MULTIVITAMIN) tablet Take 1 tablet by mouth daily.      . nitrofurantoin (MACRODANTIN) 100 MG capsule Take 100 mg by mouth daily.    . nitroGLYCERIN (NITROSTAT) 0.4 MG SL tablet Place 1 tablet (0.4 mg total) under the tongue every 5 (five) minutes as needed for chest pain. 25 tablet 3  . nortriptyline (PAMELOR) 10 MG capsule Take 20-30 mg by mouth at bedtime.     . ondansetron (ZOFRAN) 4 MG tablet Take 4 mg by mouth every 8  (eight) hours as needed for nausea or vomiting.     Marland Kitchen  ondansetron (ZOFRAN-ODT) 4 MG disintegrating tablet     . OxyCODONE (OXYCONTIN) 40 mg T12A 12 hr tablet Take 60 mg by mouth every 12 (twelve) hours.     Marland Kitchen oxyCODONE-acetaminophen (PERCOCET) 10-325 MG per tablet Take 1 tablet by mouth every 4 (four) hours as needed for pain. (Patient not taking: Reported on 12/17/2016) 30 tablet 0  . potassium chloride (KLOR-CON) 10 MEQ CR tablet Take 10 mEq by mouth daily.      Marland Kitchen PROAIR RESPICLICK 123XX123 (90 Base) MCG/ACT AEPB     . saccharomyces boulardii (FLORASTOR) 250 MG capsule Take 250 mg by mouth daily. PROBIOTIC    . tamsulosin (FLOMAX) 0.4 MG CAPS capsule Take 0.4 mg by mouth.    . varenicline (CHANTIX) 1 MG tablet Take 1 mg by mouth 2 (two) times daily.     Current Facility-Administered Medications  Medication Dose Route Frequency Provider Last Rate Last Dose  . 0.9 %  sodium chloride infusion  500 mL Intravenous Continuous Gatha Mayer, MD        Review of Systems  Constitutional:  Constitutional negative. HENT: HENT negative.  Eyes: Eyes negative.  Respiratory: Respiratory negative.  Cardiovascular: Cardiovascular negative.  GI: Gastrointestinal negative.  Musculoskeletal: Musculoskeletal negative.  Skin: Skin negative.  Neurological: Neurological negative. Hematologic: Hematologic/lymphatic negative.  Psychiatric: Psychiatric negative.        Objective:  Objective   There were no vitals filed for this visit. There is no height or weight on file to calculate BMI.  Physical Exam  Constitutional: She is oriented to person, place, and time. She appears well-developed.  HENT:  Head: Normocephalic.  Eyes: Pupils are equal, round, and reactive to light.  Neck: Neck supple.  Cardiovascular: Normal rate.   Pulmonary/Chest: Effort normal.  Abdominal: Soft. She exhibits no mass.  Musculoskeletal: Normal range of motion. She exhibits no edema.  Lymphadenopathy:    She has no cervical  adenopathy.  Neurological: She is alert and oriented to person, place, and time.  Skin: Skin is warm and dry.  Psychiatric: She has a normal mood and affect. Her behavior is normal. Judgment and thought content normal.    Data: Patent right carotid endarterectomy site with no right ICA stenosis. Velocities suggest 1-39% stenosis of her left proximal ICA.     Assessment/Plan:    66 year old white female history of right carotid arterectomy for symptomatic  high-grade stenosis. She now returns one and one half years out from that procedure. Duplex today demonstrates no significant stenosis and she is without symptoms. She will continue aspirin and statin follow-up in 1 year or sooner should she have symptoms.     [name] MD [facility]

## 2017-02-02 ENCOUNTER — Inpatient Hospital Stay (HOSPITAL_COMMUNITY)
Admission: EM | Admit: 2017-02-02 | Discharge: 2017-02-05 | DRG: 871 | Disposition: A | Discharge status: Home Health | Payer: Medicare Other | Attending: Family Medicine | Admitting: Family Medicine

## 2017-02-02 ENCOUNTER — Encounter (HOSPITAL_COMMUNITY): Payer: Self-pay | Admitting: *Deleted

## 2017-02-02 ENCOUNTER — Emergency Department (HOSPITAL_COMMUNITY): Payer: Medicare Other

## 2017-02-02 DIAGNOSIS — R509 Fever, unspecified: Secondary | ICD-10-CM

## 2017-02-02 DIAGNOSIS — K219 Gastro-esophageal reflux disease without esophagitis: Secondary | ICD-10-CM | POA: Diagnosis present

## 2017-02-02 DIAGNOSIS — G8929 Other chronic pain: Secondary | ICD-10-CM | POA: Diagnosis present

## 2017-02-02 DIAGNOSIS — S0990XA Unspecified injury of head, initial encounter: Secondary | ICD-10-CM

## 2017-02-02 DIAGNOSIS — L89622 Pressure ulcer of left heel, stage 2: Secondary | ICD-10-CM | POA: Diagnosis present

## 2017-02-02 DIAGNOSIS — M6281 Muscle weakness (generalized): Secondary | ICD-10-CM

## 2017-02-02 DIAGNOSIS — Z8673 Personal history of transient ischemic attack (TIA), and cerebral infarction without residual deficits: Secondary | ICD-10-CM

## 2017-02-02 DIAGNOSIS — M4620 Osteomyelitis of vertebra, site unspecified: Secondary | ICD-10-CM | POA: Diagnosis present

## 2017-02-02 DIAGNOSIS — F17210 Nicotine dependence, cigarettes, uncomplicated: Secondary | ICD-10-CM | POA: Diagnosis present

## 2017-02-02 DIAGNOSIS — Z981 Arthrodesis status: Secondary | ICD-10-CM

## 2017-02-02 DIAGNOSIS — A419 Sepsis, unspecified organism: Secondary | ICD-10-CM | POA: Diagnosis not present

## 2017-02-02 DIAGNOSIS — Z961 Presence of intraocular lens: Secondary | ICD-10-CM | POA: Diagnosis present

## 2017-02-02 DIAGNOSIS — Z79899 Other long term (current) drug therapy: Secondary | ICD-10-CM

## 2017-02-02 DIAGNOSIS — R159 Full incontinence of feces: Secondary | ICD-10-CM | POA: Diagnosis present

## 2017-02-02 DIAGNOSIS — Z85830 Personal history of malignant neoplasm of bone: Secondary | ICD-10-CM

## 2017-02-02 DIAGNOSIS — Z923 Personal history of irradiation: Secondary | ICD-10-CM

## 2017-02-02 DIAGNOSIS — Z9841 Cataract extraction status, right eye: Secondary | ICD-10-CM

## 2017-02-02 DIAGNOSIS — D649 Anemia, unspecified: Secondary | ICD-10-CM | POA: Diagnosis present

## 2017-02-02 DIAGNOSIS — E1169 Type 2 diabetes mellitus with other specified complication: Secondary | ICD-10-CM | POA: Diagnosis present

## 2017-02-02 DIAGNOSIS — F419 Anxiety disorder, unspecified: Secondary | ICD-10-CM | POA: Diagnosis present

## 2017-02-02 DIAGNOSIS — I10 Essential (primary) hypertension: Secondary | ICD-10-CM | POA: Diagnosis present

## 2017-02-02 DIAGNOSIS — Z8542 Personal history of malignant neoplasm of other parts of uterus: Secondary | ICD-10-CM

## 2017-02-02 DIAGNOSIS — G9340 Encephalopathy, unspecified: Secondary | ICD-10-CM | POA: Diagnosis present

## 2017-02-02 DIAGNOSIS — Z9842 Cataract extraction status, left eye: Secondary | ICD-10-CM

## 2017-02-02 DIAGNOSIS — R4182 Altered mental status, unspecified: Secondary | ICD-10-CM | POA: Diagnosis present

## 2017-02-02 DIAGNOSIS — F329 Major depressive disorder, single episode, unspecified: Secondary | ICD-10-CM | POA: Diagnosis present

## 2017-02-02 DIAGNOSIS — W19XXXA Unspecified fall, initial encounter: Secondary | ICD-10-CM

## 2017-02-02 DIAGNOSIS — M109 Gout, unspecified: Secondary | ICD-10-CM | POA: Diagnosis present

## 2017-02-02 DIAGNOSIS — E11649 Type 2 diabetes mellitus with hypoglycemia without coma: Secondary | ICD-10-CM | POA: Diagnosis present

## 2017-02-02 DIAGNOSIS — G4700 Insomnia, unspecified: Secondary | ICD-10-CM | POA: Diagnosis present

## 2017-02-02 DIAGNOSIS — J440 Chronic obstructive pulmonary disease with acute lower respiratory infection: Secondary | ICD-10-CM | POA: Diagnosis present

## 2017-02-02 DIAGNOSIS — Z794 Long term (current) use of insulin: Secondary | ICD-10-CM

## 2017-02-02 DIAGNOSIS — J189 Pneumonia, unspecified organism: Secondary | ICD-10-CM | POA: Diagnosis present

## 2017-02-02 DIAGNOSIS — I2510 Atherosclerotic heart disease of native coronary artery without angina pectoris: Secondary | ICD-10-CM | POA: Diagnosis present

## 2017-02-02 DIAGNOSIS — Z79891 Long term (current) use of opiate analgesic: Secondary | ICD-10-CM

## 2017-02-02 DIAGNOSIS — E876 Hypokalemia: Secondary | ICD-10-CM | POA: Diagnosis present

## 2017-02-02 DIAGNOSIS — K644 Residual hemorrhoidal skin tags: Secondary | ICD-10-CM | POA: Diagnosis present

## 2017-02-02 DIAGNOSIS — R32 Unspecified urinary incontinence: Secondary | ICD-10-CM

## 2017-02-02 DIAGNOSIS — Z7982 Long term (current) use of aspirin: Secondary | ICD-10-CM

## 2017-02-02 DIAGNOSIS — E785 Hyperlipidemia, unspecified: Secondary | ICD-10-CM | POA: Diagnosis present

## 2017-02-02 LAB — URINALYSIS, ROUTINE W REFLEX MICROSCOPIC
Bilirubin Urine: NEGATIVE
GLUCOSE, UA: NEGATIVE mg/dL
Ketones, ur: 5 mg/dL — AB
NITRITE: NEGATIVE
PH: 5 (ref 5.0–8.0)
Protein, ur: 30 mg/dL — AB
SPECIFIC GRAVITY, URINE: 1.016 (ref 1.005–1.030)
Squamous Epithelial / LPF: NONE SEEN

## 2017-02-02 LAB — COMPREHENSIVE METABOLIC PANEL
ALT: 19 U/L (ref 14–54)
ANION GAP: 14 (ref 5–15)
AST: 23 U/L (ref 15–41)
Albumin: 2.7 g/dL — ABNORMAL LOW (ref 3.5–5.0)
Alkaline Phosphatase: 68 U/L (ref 38–126)
BILIRUBIN TOTAL: 0.7 mg/dL (ref 0.3–1.2)
BUN: 23 mg/dL — ABNORMAL HIGH (ref 6–20)
CHLORIDE: 95 mmol/L — AB (ref 101–111)
CO2: 24 mmol/L (ref 22–32)
Calcium: 9.1 mg/dL (ref 8.9–10.3)
Creatinine, Ser: 1.07 mg/dL — ABNORMAL HIGH (ref 0.44–1.00)
GFR calc non Af Amer: 53 mL/min — ABNORMAL LOW (ref >60)
Glucose, Bld: 82 mg/dL (ref 65–99)
POTASSIUM: 3.3 mmol/L — AB (ref 3.5–5.1)
Sodium: 133 mmol/L — ABNORMAL LOW (ref 135–145)
TOTAL PROTEIN: 6.3 g/dL — AB (ref 6.5–8.1)

## 2017-02-02 LAB — CBC
HCT: 29.4 % — ABNORMAL LOW (ref 36.0–46.0)
Hemoglobin: 9.7 g/dL — ABNORMAL LOW (ref 12.0–15.0)
MCH: 28.3 pg (ref 26.0–34.0)
MCHC: 33 g/dL (ref 30.0–36.0)
MCV: 85.7 fL (ref 78.0–100.0)
PLATELETS: 430 10*3/uL — AB (ref 150–400)
RBC: 3.43 MIL/uL — ABNORMAL LOW (ref 3.87–5.11)
RDW: 18.7 % — AB (ref 11.5–15.5)
WBC: 20.2 10*3/uL — AB (ref 4.0–10.5)

## 2017-02-02 LAB — I-STAT CG4 LACTIC ACID, ED
Lactic Acid, Venous: 0.9 mmol/L (ref 0.5–1.9)
Lactic Acid, Venous: 0.56 mmol/L (ref 0.5–1.9)

## 2017-02-02 LAB — RAPID URINE DRUG SCREEN, HOSP PERFORMED
AMPHETAMINES: NOT DETECTED
BARBITURATES: NOT DETECTED
Benzodiazepines: NOT DETECTED
Cocaine: NOT DETECTED
Opiates: POSITIVE — AB
TETRAHYDROCANNABINOL: NOT DETECTED

## 2017-02-02 LAB — ETHANOL: Alcohol, Ethyl (B): <5 mg/dL (ref <5)

## 2017-02-02 MED ORDER — VANCOMYCIN HCL IN DEXTROSE 750-5 MG/150ML-% IV SOLN
750.0000 mg | Freq: Two times a day (BID) | INTRAVENOUS | Status: DC
  Administered 2017-02-03 – 2017-02-04 (×3): 750 mg via INTRAVENOUS
  Filled 2017-02-02 (×5): qty 150

## 2017-02-02 MED ORDER — OXYCODONE HCL ER 10 MG PO T12A
30.0000 mg | EXTENDED_RELEASE_TABLET | Freq: Two times a day (BID) | ORAL | Status: DC
  Administered 2017-02-03 – 2017-02-05 (×4): 30 mg via ORAL
  Filled 2017-02-02 (×5): qty 3

## 2017-02-02 MED ORDER — ESCITALOPRAM OXALATE 10 MG PO TABS
20.0000 mg | ORAL_TABLET | Freq: Every day | ORAL | Status: DC
  Administered 2017-02-03 – 2017-02-05 (×3): 20 mg via ORAL
  Filled 2017-02-02 (×4): qty 2

## 2017-02-02 MED ORDER — ACETAMINOPHEN 650 MG RE SUPP: 650.0000 mg | Freq: Four times a day (QID) | RECTAL | Status: DC | PRN

## 2017-02-02 MED ORDER — MELATONIN 3 MG PO TABS
9.0000 mg | ORAL_TABLET | Freq: Every day | ORAL | Status: DC
  Administered 2017-02-03 – 2017-02-04 (×3): 9 mg via ORAL
  Filled 2017-02-02 (×4): qty 3

## 2017-02-02 MED ORDER — ASPIRIN EC 81 MG PO TBEC
81.0000 mg | DELAYED_RELEASE_TABLET | Freq: Every day | ORAL | Status: DC
  Administered 2017-02-03 – 2017-02-05 (×3): 81 mg via ORAL
  Filled 2017-02-02 (×3): qty 1

## 2017-02-02 MED ORDER — IPRATROPIUM-ALBUTEROL 0.5-2.5 (3) MG/3ML IN SOLN
3.0000 mL | Freq: Four times a day (QID) | RESPIRATORY_TRACT | Status: DC
  Administered 2017-02-03 (×4): 3 mL via RESPIRATORY_TRACT
  Filled 2017-02-02 (×4): qty 3

## 2017-02-02 MED ORDER — NITROGLYCERIN 0.4 MG SL SUBL: 0.4000 mg | SUBLINGUAL_TABLET | SUBLINGUAL | Status: DC | PRN

## 2017-02-02 MED ORDER — ACETAMINOPHEN 325 MG PO TABS
650.0000 mg | ORAL_TABLET | Freq: Four times a day (QID) | ORAL | Status: DC | PRN
  Administered 2017-02-03 – 2017-02-05 (×2): 650 mg via ORAL
  Filled 2017-02-02 (×2): qty 2

## 2017-02-02 MED ORDER — INSULIN GLARGINE 100 UNIT/ML ~~LOC~~ SOLN
15.0000 [IU] | Freq: Every day | SUBCUTANEOUS | Status: DC
  Administered 2017-02-03: 15 [IU] via SUBCUTANEOUS
  Filled 2017-02-02: qty 0.15

## 2017-02-02 MED ORDER — ACETAMINOPHEN 500 MG PO TABS
1000.0000 mg | ORAL_TABLET | Freq: Once | ORAL | Status: AC
  Administered 2017-02-02: 1000 mg via ORAL
  Filled 2017-02-02: qty 2

## 2017-02-02 MED ORDER — ALBUTEROL SULFATE (2.5 MG/3ML) 0.083% IN NEBU: 2.5000 mg | INHALATION_SOLUTION | RESPIRATORY_TRACT | Status: DC | PRN

## 2017-02-02 MED ORDER — FERROUS SULFATE 325 (65 FE) MG PO TABS
325.0000 mg | ORAL_TABLET | Freq: Two times a day (BID) | ORAL | Status: DC
Start: 2017-02-03 — End: 2017-02-05
  Administered 2017-02-03 – 2017-02-05 (×4): 325 mg via ORAL
  Filled 2017-02-02 (×6): qty 1

## 2017-02-02 MED ORDER — SODIUM CHLORIDE 0.9% FLUSH
3.0000 mL | Freq: Two times a day (BID) | INTRAVENOUS | Status: DC
  Administered 2017-02-03 – 2017-02-05 (×6): 3 mL via INTRAVENOUS

## 2017-02-02 MED ORDER — ENOXAPARIN SODIUM 40 MG/0.4ML ~~LOC~~ SOLN
40.0000 mg | Freq: Every day | SUBCUTANEOUS | Status: DC
  Administered 2017-02-03 – 2017-02-05 (×3): 40 mg via SUBCUTANEOUS
  Filled 2017-02-02 (×3): qty 0.4

## 2017-02-02 MED ORDER — SODIUM CHLORIDE 0.9 % IV BOLUS (SEPSIS)
1000.0000 mL | Freq: Once | INTRAVENOUS | Status: AC
  Administered 2017-02-02: 1000 mL via INTRAVENOUS

## 2017-02-02 MED ORDER — POTASSIUM CHLORIDE CRYS ER 20 MEQ PO TBCR
40.0000 meq | EXTENDED_RELEASE_TABLET | Freq: Once | ORAL | Status: AC
  Administered 2017-02-02: 40 meq via ORAL
  Filled 2017-02-02: qty 2

## 2017-02-02 MED ORDER — ONDANSETRON 4 MG PO TBDP
4.0000 mg | ORAL_TABLET | Freq: Three times a day (TID) | ORAL | Status: DC | PRN
  Administered 2017-02-03: 4 mg via ORAL
  Filled 2017-02-02 (×2): qty 1

## 2017-02-02 MED ORDER — SODIUM CHLORIDE 0.9 % IV BOLUS (SEPSIS)
500.0000 mL | Freq: Once | INTRAVENOUS | Status: AC
  Administered 2017-02-02: 500 mL via INTRAVENOUS

## 2017-02-02 MED ORDER — MOMETASONE FURO-FORMOTEROL FUM 200-5 MCG/ACT IN AERO
2.0000 | INHALATION_SPRAY | Freq: Two times a day (BID) | RESPIRATORY_TRACT | Status: DC
  Administered 2017-02-03 – 2017-02-04 (×5): 2 via RESPIRATORY_TRACT
  Filled 2017-02-02: qty 8.8

## 2017-02-02 MED ORDER — POLYETHYLENE GLYCOL 3350 17 G PO PACK: 17.0000 g | PACK | Freq: Every day | ORAL | Status: DC | PRN

## 2017-02-02 MED ORDER — METAXALONE 800 MG PO TABS
800.0000 mg | ORAL_TABLET | Freq: Three times a day (TID) | ORAL | Status: DC
  Administered 2017-02-03 – 2017-02-05 (×7): 800 mg via ORAL
  Filled 2017-02-02 (×9): qty 1

## 2017-02-02 MED ORDER — VARENICLINE TARTRATE 1 MG PO TABS
1.0000 mg | ORAL_TABLET | Freq: Every day | ORAL | Status: DC
  Administered 2017-02-03 – 2017-02-05 (×3): 1 mg via ORAL
  Filled 2017-02-02 (×3): qty 1

## 2017-02-02 MED ORDER — MAGNESIUM OXIDE 400 (241.3 MG) MG PO TABS
400.0000 mg | ORAL_TABLET | Freq: Every day | ORAL | Status: DC
  Filled 2017-02-02: qty 1

## 2017-02-02 MED ORDER — PANTOPRAZOLE SODIUM 40 MG PO TBEC
40.0000 mg | DELAYED_RELEASE_TABLET | Freq: Every day | ORAL | Status: DC
  Administered 2017-02-03 – 2017-02-05 (×3): 40 mg via ORAL
  Filled 2017-02-02 (×3): qty 1

## 2017-02-02 MED ORDER — GABAPENTIN 300 MG PO CAPS
600.0000 mg | ORAL_CAPSULE | Freq: Every day | ORAL | Status: DC
  Administered 2017-02-03 – 2017-02-05 (×9): 600 mg via ORAL
  Filled 2017-02-02 (×10): qty 2

## 2017-02-02 MED ORDER — INSULIN ASPART 100 UNIT/ML ~~LOC~~ SOLN
0.0000 [IU] | Freq: Three times a day (TID) | SUBCUTANEOUS | Status: DC
  Administered 2017-02-03 – 2017-02-04 (×2): 1 [IU] via SUBCUTANEOUS
  Administered 2017-02-04: 2 [IU] via SUBCUTANEOUS
  Administered 2017-02-04: 1 [IU] via SUBCUTANEOUS
  Filled 2017-02-02: qty 1

## 2017-02-02 MED ORDER — POTASSIUM CHLORIDE CRYS ER 10 MEQ PO TBCR
10.0000 meq | EXTENDED_RELEASE_TABLET | Freq: Every day | ORAL | Status: DC
  Administered 2017-02-03: 10 meq via ORAL
  Filled 2017-02-02: qty 1

## 2017-02-02 MED ORDER — ROSUVASTATIN CALCIUM 10 MG PO TABS
5.0000 mg | ORAL_TABLET | Freq: Every day | ORAL | Status: DC
  Administered 2017-02-03 – 2017-02-04 (×3): 5 mg via ORAL
  Filled 2017-02-02 (×4): qty 1

## 2017-02-02 MED ORDER — VANCOMYCIN HCL IN DEXTROSE 1-5 GM/200ML-% IV SOLN: 1000.0000 mg | Freq: Once | INTRAVENOUS | Status: DC

## 2017-02-02 MED ORDER — PIPERACILLIN-TAZOBACTAM 3.375 G IVPB 30 MIN
3.3750 g | Freq: Once | INTRAVENOUS | Status: AC
  Administered 2017-02-02: 3.375 g via INTRAVENOUS
  Filled 2017-02-02: qty 50

## 2017-02-02 MED ORDER — PIPERACILLIN-TAZOBACTAM 3.375 G IVPB
3.3750 g | Freq: Three times a day (TID) | INTRAVENOUS | Status: DC
  Administered 2017-02-02 – 2017-02-04 (×5): 3.375 g via INTRAVENOUS
  Filled 2017-02-02 (×6): qty 50

## 2017-02-02 MED ORDER — SODIUM CHLORIDE 0.9 % IV SOLN: INTRAVENOUS | Status: DC

## 2017-02-02 MED ORDER — SODIUM CHLORIDE 0.9 % IV SOLN
INTRAVENOUS | Status: AC
  Administered 2017-02-03: 01:00:00 via INTRAVENOUS

## 2017-02-02 MED ORDER — NORTRIPTYLINE HCL 10 MG PO CAPS
20.0000 mg | ORAL_CAPSULE | Freq: Every day | ORAL | Status: DC
  Administered 2017-02-03: 20 mg via ORAL
  Filled 2017-02-02: qty 2

## 2017-02-02 MED ORDER — TAPENTADOL HCL 50 MG PO TABS
50.0000 mg | ORAL_TABLET | Freq: Four times a day (QID) | ORAL | Status: DC
  Administered 2017-02-04 – 2017-02-05 (×6): 50 mg via ORAL
  Filled 2017-02-02 (×6): qty 1

## 2017-02-02 MED ORDER — SODIUM CHLORIDE 0.9 % IV SOLN
1500.0000 mg | Freq: Once | INTRAVENOUS | Status: AC
  Administered 2017-02-02: 1500 mg via INTRAVENOUS
  Filled 2017-02-02: qty 1500

## 2017-02-02 MED ORDER — ALBUTEROL SULFATE (2.5 MG/3ML) 0.083% IN NEBU: 2.5000 mg | INHALATION_SOLUTION | Freq: Four times a day (QID) | RESPIRATORY_TRACT | Status: DC | PRN

## 2017-02-02 NOTE — ED Notes (Signed)
Placed patient on 2L O2

## 2017-02-02 NOTE — ED Notes (Signed)
In and out cath done by Estill Batten - RN and Lavella Lemons - EMT assisted.

## 2017-02-02 NOTE — Progress Notes (Signed)
Pharmacy Antibiotic Note  Lauren [name] is a 66 y.o. female admitted on 02/02/2017 with sepsis.  Pt reports recent falls and has elevated WBC. Pharmacy has been consulted for Vancomycin/Zosyn dosing.   Plan: -Zosyn 3.375g IV x1 over 30 min -Zosyn 3.375g IV q8h over 4 hr -Vancomycin 1500mg  IV x1 -Vancomycin 750mg  IV q12h -F/U LOT, cultures, VT as indicated  Height: 5\' 5"  (165.1 cm) Weight: 167 lb (75.8 kg) IBW/kg (Calculated) : 57  Temp (24hrs), Avg:101.6 F (38.7 C), Min:101.6 F (38.7 C), Max:101.6 F (38.7 C)   Recent Labs Lab 02/02/17 1540 02/02/17 1556  WBC 20.2*  --   LATICACIDVEN  --  0.90    CrCl cannot be calculated (Patient's most recent lab result is older than the maximum 21 days allowed.).    Allergies  Allergen Reactions  . Ambien [Zolpidem Tartrate] Other (See Comments)    Per the pt, "it makes me go crazy"  . Sulfa Antibiotics Rash  . Elavil [Amitriptyline Hcl] Other (See Comments)    Gastritis  . Prozac [Fluoxetine Hcl] Anxiety and Other (See Comments)    irritability     Antimicrobials this admission: 2/5 Zosyn >> 2/5 Vancomycin >>  Dose adjustments this admission: none  Microbiology results: 2/5 BCx: IP 2/5 UCx: IP   Thank you for allowing pharmacy to be a part of this patient's care.  Arrie Senate, PharmD PGY-1 Pharmacy Resident Pager: [phone] 02/02/2017

## 2017-02-02 NOTE — ED Triage Notes (Signed)
Patient comes in with recent falls. From home. Awaiting back surgery and being weaned off pain meds. Patient has slurred speech and drowsiness. Today was last fall at home. Denies LOC. A/ox2. Patient lost her balance and fell. Hx current smoker. Last dose of pain meds was midnight per husband. Husband at bedside explaining pain med frequency, stating she missed her morning dose. This RN explained not to take any home meds, including pain meds, until we know what is going on.

## 2017-02-02 NOTE — H&P (Signed)
[facility] Admission History and Physical Service Pager: [phone]  Patient name: Lauren [name] Medical record number: [id number] Date of birth: 28-Oct-1951 Age: 66 y.o. Gender: female  Primary Care Provider: Marylene Land, MD Consultants: none Code Status: full  Chief Complaint: AMS  Assessment and Plan: Lauren [name] is a 66 y.o. female presenting with AMS and sepsis. PMH is significant for COPD, CAD, Hyperlipidemia, HTN, T2DM, spinal cord tumor, DJD, depression, tobacco abuse, anemia  Sepsis 2/2 to CAP- CXR with RLL and RML pneumonia. SIRS criteria with leukocytosis (WBC 20.2), fever (101.6). HR in 90's. RR normal. BP down to 97/61, most recently normotensive at 127/56. Receiving fluid bolus in ED, total of 3.5L per sepsis protocol. Lactic acid 0.9 > 0.56. Nasal cannula in place but not hooked up to any oxygen. Think aspiration pneumonia is less likely, as Pt only had one small episode of vomiting while sitting up. Patient denies vomiting in her sleep.  -admit to step down unit, attending Dr. Ree Kida -vitals per floor -oxygen supplementation as needed to keep O2 > 92% -up with assistance -continue vanc and zosyn per pharmacy consult, can de-escalate to CTX and azithro tomorrow -blood cultures pending  -pain management as below -zofran as needed for nausea  AMS- Per husband, Pt has had confusion and slurred speech for the last few months. Fell and hit head last night. Head CT without evidence of bleed. UDS positive only for opiates, alcohol level negative. Suspect this is secondary to sepsis versus sequelae of high doses of pain medications. Neuro exam somewhat limited secondary to pain but without focal deficits. Patient alert and oriented x 3 by our exam. -treat pneumonia as above -monitor for improvement  Hypokalemia: K 3.3 on admission. - Replace with K-dur 71mEq - Recheck in AM  Chronic back pain- patient managed by pain clinic who has  recently been trying to titrate down pain medications. Pain is secondary to multiple previous back surgeries. 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 She underwent L4-L5 and L5-S1 spinal fusion, laminectomy, and decompression of the thecal sac in 06/2013. In 07/2013, she was re-admitted and underwent I&D of a lumbar seroma and revision laminectomy with decompression. There was concern for osteomyelitis and she was treated with 6 weeks of IV vanc and cefepime. While she was on antibiotics, she had an MRI that showed loosening of the hardware and two fluid collections. She underwent removal of the hardware in 12/2013, irrigation and debridement, and revision of the lumbar laminectomy. She then completed a 6 week course of IV Daptomycin. Currently using 170 morphine equivalents daily. Pain clinic trying to wean her. -continue home regimen- oxycontin 30mg  q12hrs, Tapentadol 50mg  q6hrs, Gabapentin 600mg  six times daily, metaxalone 800mg  tid, Nortriptyline 20mg  qhs  COPD- patient uses albuterol inhaler as needed as home, also uses advair daily. Pt has mild diffuse expiratory wheezing on exam, but she is moving good air, so do not think this is a COPD exacerbation. -duonebs q6 hours scheduled and albuterol q2hrs prn -dulera BID -consider adding steroids if patient does not improve with treatment of pneumonia -oxygen as above  HTN- BP soft on admission. Takes 25 mg chlorthalidone, losartan 100mg , and toprol-xl 50mg  daily -holding home BP medications in setting of soft BP's and sepsis -restart home meds as able  T2DM- takes 35 units lantus/daily, metformin 1000 BID -hold home metformin -lantus 15 units every night -sensitive sliding scale  CAD- daily aspirin and  SL nitroglycerin as needed -continue aspirin  HLD- on crestor 5mg  -continue home statin  Depression- taking lexapro 20mg  daily, nortriptyline 20 mg qhs -continue home psych meds  Tobacco  abuse- currently on Chantix daily. Smokes about 1 cigarette a day. -continue chantix  Anemia- takes daily iron, hemoglobin 9.7 on admission, baseline appears to be 10-12 per past chart review.  -continue iron -monitor CBC -transfusion threshold < 8  FEN/GI: heart healthy carb modified diet, NS @ 75/hr Prophylaxis: Lovenox  Disposition: SDU  History of Present Illness:  Lauren [name] is a 66 y.o. female presenting with fall, cough, fever, and AMS. Patient has had cough, fever, and chills for the last 3 days. Cough is productive of yellow sputum. She has not had any shortness of breath. Husband has been giving her Mucinex. Per husband, she has had slurred speech and confusion for months and they have been trying to wean her pain medications. She endorses pain with taking a deep breath. She had a small amount of vomiting once yesterday. She endorses nausea, decreased appetite, and generalized weakness. Per husband, she was trying to walk today using her cane when she fell and hit her head on the wall. This is why he brought her to the ED. She did not have any loss of consciousness.   In the ED, she was meeting SIRS criteria with fever to 101.6, HR 98, WBC 20.2. She had a soft blood pressure to 97/61, but was otherwise normotensive. Lactic acid 0.90 > 0.56. UA unremarkable. CXR with RML and RLL pneumonia. She was given 57ml/kg fluids per sepsis protocol and was started on Vanc/Zosyn.  Review Of Systems: Per HPI with the following additions:   Review of Systems  Constitutional: Positive for chills and fever.  Eyes: Positive for blurred vision and double vision.  Respiratory: Positive for cough. Negative for shortness of breath.   Cardiovascular: Positive for chest pain. Negative for leg swelling.  Gastrointestinal: Positive for diarrhea, nausea and vomiting. Negative for abdominal pain.  Genitourinary: Negative for dysuria and urgency.  Musculoskeletal: Positive for back pain, falls, joint  pain and myalgias.  Neurological: Positive for dizziness and headaches. Negative for seizures.    Patient Active Problem List   Diagnosis Date Noted  . Bilateral carotid artery stenosis 01/30/2017  . Aftercare following surgery of the circulatory system 01/30/2017  . Impingement syndrome of left shoulder 12/17/2016  . Coronary artery calcification of native artery 10/07/2016  . Iron (Fe) deficiency anemia 10/07/2016  . DDD (degenerative disc disease), lumbar 10/03/2016  . Fatigue 10/03/2016  . Intervertebral disc disease 10/03/2016  . Low back pain 10/03/2016  . Myelopathy ([place]) 10/03/2016  . Prolapsed lumbosacral intervertebral disc 10/03/2016  . Spondylisthesis 10/03/2016  . MRSA (methicillin resistant staph aureus) culture positive 10/10/2015  . Carotid stenosis 08/17/2015  . Preoperative cardiovascular examination 08/10/2015  . Tobacco abuse 08/10/2015  . 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 Acute osteomyelitis of spine ([place]) 08/28/2014  . Difficult or painful urination 08/28/2014  . H/O Spinal surgery 08/28/2014  . Elevated WBC count 08/28/2014  . Encounter for aftercare for long-term (current) use of antibiotics 08/28/2014  . N&V (nausea and vomiting) 08/28/2014  . Bladder retention 08/28/2014  . Infection of urinary tract 08/28/2014  . DM2 (diabetes mellitus, type 2) ([employer]) 06/15/2013  . Ventral hernia 06/15/2013  . COPD (chronic obstructive pulmonary disease) ([place]) 06/15/2013  . DJD (degenerative joint disease) of lumbar spine 06/15/2013  . Depression 06/15/2013  . Exertional angina ([place]) 06/15/2013  . Preoperative  evaluation 06/15/2013  . HTN (hypertension) 06/15/2013  . Occlusion and stenosis of carotid artery without mention of cerebral infarction 03/02/2012  . Malignant neoplasm of corpus uteri, except isthmus ([place]) 01/19/2012  . Abnormal EKG 12/07/2011  . Essential hypertension, benign 12/07/2011  . Hyperlipidemia with low HDL 12/07/2011  . Carotid artery disease  without cerebral infarction ([place]) 12/07/2011    Past Medical History: Past Medical History:  Diagnosis Date  . Anemia   . Anxiety   . CAD (coronary artery disease)   . Carotid artery occlusion   . Cataract    bil cateracts removed  . COPD (chronic obstructive pulmonary disease) ([place])   . Cough   . DDD (degenerative disc disease), lumbar   . Depression   . Diabetes mellitus   . Endometrial ca [facility])    endometrial ca dx 4/11  . GERD (gastroesophageal reflux disease)   . Gout 2015  . Hernia of flank   . Hyperlipidemia   . Hypertension   . Insomnia   . OAB (overactive bladder)   . Osteoarthritis   . Peripheral neuropathy ([place])    following chemotherapy  . Psoriasis   . Pulmonary nodule    since 2016  . Radiation 05/01/2011-06/11/11   5600 cGy 28 fxs/periaortic  . Stroke ([place])    tia  . TIA (transient ischemic attack) 2015  . 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 Uterine cancer ([place])   . Ventral hernia    postoperative since 2009    Past Surgical History: Past Surgical History:  Procedure Laterality Date  . ABDOMINAL HYSTERECTOMY  2009   for ca  . BACK SURGERY  Aug/sept 2014   3 back surgeries  (Aug 2014 & Sept 2014)  . CHOLECYSTECTOMY     biliteral  . COLONOSCOPY  11/13/2014   negative except for a small cecal AVM and 1 polyp  . ENDARTERECTOMY Right 08/17/2015   Procedure: Right Carotid Endartarectomy with patch Angioplasty ;  Surgeon: Mal Misty, MD;  Location: [facility];  Service: Vascular;  Laterality: Right;  . EYE SURGERY Right Aug. 2013   Cataract, lens implant  . EYE SURGERY Left Sept. 2013   Cataract, lens implant  . I & D of lumbar fusion /removal of hardware for chronic infection  Jan. 8, 2015  . right carotid endarterectomy  2016  . SPINE SURGERY      Social History: Social History  Substance Use Topics  . Smoking status: Light Tobacco Smoker    Packs/day: 0.10    Years: 40.00    Types: Cigarettes  . Smokeless  tobacco: Never Used     Comment: stated smoking 1 cigarette/ day; trying to quit  . Alcohol use No   Additional social history: Lives at home with her husband. Does not work. Used to smoke a pack per day. Please also refer to relevant sections of EMR.  Family History: Family History  Problem Relation Age of Onset  . Stroke Mother   . Irritable bowel syndrome Mother   . Heart attack Father 60    died  . Heart disease Father     Heart Disease before age 44, rheumatic heart disease  . Hyperlipidemia Father   . Hypertension Father   . Diabetes Daughter   . Hypertension Daughter   . Irritable bowel syndrome Daughter   . Colon cancer Neg Hx   . Esophageal cancer Neg Hx   . Pancreatic cancer Neg Hx   .  Rectal cancer Neg Hx   . Stomach cancer Neg Hx     Allergies and Medications: Allergies  Allergen Reactions  . Ambien [Zolpidem Tartrate] Other (See Comments)    Per the pt, "it makes me go crazy"  . Sulfa Antibiotics Rash  . Elavil [Amitriptyline Hcl] Other (See Comments)    Gastritis  . Prozac [Fluoxetine Hcl] Anxiety and Other (See Comments)    irritability    Current Facility-Administered Medications on File Prior to Encounter  Medication Dose Route Frequency Provider Last Rate Last Dose  . 0.9 %  sodium chloride infusion  500 mL Intravenous Continuous Gatha Mayer, MD       Current Outpatient Prescriptions on File Prior to Encounter  Medication Sig Dispense Refill  . chlorthalidone (HYGROTON) 25 MG tablet Take 25 mg by mouth daily.     Marland Kitchen escitalopram (LEXAPRO) 20 MG tablet Take 20 mg by mouth daily.    Marland Kitchen esomeprazole (NEXIUM) 40 MG capsule Take 40 mg by mouth daily.     . insulin glargine (LANTUS) 100 UNIT/ML injection Inject 35 Units into the skin daily after supper.     . losartan (COZAAR) 100 MG tablet Take 100 mg by mouth daily.      . nitrofurantoin (MACRODANTIN) 100 MG capsule Take 100 mg by mouth at bedtime.     . potassium chloride (KLOR-CON) 10 MEQ CR tablet  Take 10 mEq by mouth daily.      Marland Kitchen albuterol (PROVENTIL HFA;VENTOLIN HFA) 108 (90 BASE) MCG/ACT inhaler Inhale 2 puffs into the lungs every 6 (six) hours as needed for wheezing or shortness of breath.    . ALPRAZolam (XANAX) 0.5 MG tablet Take 0.5 mg by mouth at bedtime as needed.      Marland Kitchen aspirin 81 MG tablet Take 81 mg by mouth daily.      Marland Kitchen atorvastatin (LIPITOR) 10 MG tablet Take 10 mg by mouth daily.    . betamethasone dipropionate (DIPROLENE) 0.05 % ointment Apply topically 2 (two) times daily.    . Cholecalciferol (VITAMIN D3) 1000 UNITS CAPS Take 1 Units by mouth daily.      . Cyanocobalamin (VITAMIN B-12 CR) 1500 MCG TBCR Take by mouth daily.    . Fluticasone-Salmeterol (ADVAIR) 250-50 MCG/DOSE AEPB Inhale 1 puff into the lungs every 12 (twelve) hours.      . folic acid (FOLVITE) 1 MG tablet Take 1 mg by mouth daily.    Marland Kitchen gabapentin (NEURONTIN) 600 MG tablet Take 600 mg by mouth 2 (two) times daily. Pt may take up to 6 Tabs if needed per day.    Marland Kitchen glucose blood (ACCU-CHEK AVIVA PLUS) test strip 1 each by Other route as needed. Use as instructed     . magnesium oxide (MAG-OX) 400 MG tablet Take 400 mg by mouth daily.    . metaxalone (SKELAXIN) 800 MG tablet Take 800 mg by mouth every 8 (eight) hours.    . methocarbamol (ROBAXIN) 750 MG tablet Take 750 mg by mouth daily.    . methotrexate (RHEUMATREX) 2.5 MG tablet Take 15 mg by mouth once a week. Caution:Chemotherapy. Protect from light.    . metoprolol (TOPROL-XL) 100 MG 24 hr tablet Take 50 mg by mouth daily after breakfast.     . Multiple Vitamin (MULTIVITAMIN) tablet Take 1 tablet by mouth daily.      . nitroGLYCERIN (NITROSTAT) 0.4 MG SL tablet Place 1 tablet (0.4 mg total) under the tongue every 5 (five) minutes as needed for chest  pain. 25 tablet 3  . nortriptyline (PAMELOR) 10 MG capsule Take 20-30 mg by mouth at bedtime.     . ondansetron (ZOFRAN) 4 MG tablet Take 4 mg by mouth every 8 (eight) hours as needed for nausea or  vomiting.     . ondansetron (ZOFRAN-ODT) 4 MG disintegrating tablet     . OxyCODONE (OXYCONTIN) 40 mg T12A 12 hr tablet Take 30 mg by mouth every 12 (twelve) hours.     Marland Kitchen oxyCODONE-acetaminophen (PERCOCET) 10-325 MG per tablet Take 1 tablet by mouth every 4 (four) hours as needed for pain. (Patient not taking: Reported on 12/17/2016) 30 tablet 0  . PROAIR RESPICLICK 123XX123 (90 Base) MCG/ACT AEPB     . saccharomyces boulardii (FLORASTOR) 250 MG capsule Take 250 mg by mouth daily. PROBIOTIC    . tamsulosin (FLOMAX) 0.4 MG CAPS capsule Take 0.4 mg by mouth.    . tapentadol (NUCYNTA) 50 MG tablet Take 50 mg by mouth every 6 (six) hours.    . varenicline (CHANTIX) 1 MG tablet Take 1 mg by mouth 2 (two) times daily.      Objective: BP 97/61   Pulse 96   Temp 101.6 F (38.7 C) (Rectal)   Resp 17   Ht 5\' 5"  (1.651 m)   Wt 167 lb (75.8 kg)   SpO2 93%   BMI 27.79 kg/m  Exam: General: laying in bed with [place] in place (but no O2 flowing), non-toxic appearance, NAD Eyes: EOMI, PERRL ENTM: dry mucous membranes Neck: supple, non-tender, no lymphadenopathy Cardiovascular: RRR no MRG. +2 dorsalis pedis pulses Respiratory: diffuse expiratory wheezes auscultated bilaterally, coarse breath sounds. No increased work of breathing Gastrointestinal: large ventral hernia present that is soft and non-tender, non-distended, good bowel sounds.  MSK: warm, well perfused, no edema or cyanosis Derm: skin is dry. No rashes or lesions noted Neuro: no focal deficits, strength and sensation intact throughout Psych: appropriate mood and affect  Labs and Imaging: CBC BMET   Recent Labs Lab 02/02/17 1540  WBC 20.2*  HGB 9.7*  HCT 29.4*  PLT 430*    Recent Labs Lab 02/02/17 1540  NA 133*  K 3.3*  CL 95*  CO2 24  BUN 23*  CREATININE 1.07*  GLUCOSE 82  CALCIUM 9.1     Dg Chest 2 View  Result Date: 02/02/2017 CLINICAL DATA:  66 year old with multiple recent falls, including a fall at home earlier today.  Current smoker. Patient currently awaiting back surgery and is being weaned from pain medicines. EXAM: CHEST  2 VIEW COMPARISON:  CT chest 09/04/2016, 02/23/2015 and earlier. FINDINGS: Cardiac silhouette normal in size, unchanged. Thoracic aorta atherosclerotic, unchanged. Hilar and mediastinal contours otherwise unremarkable. Airspace consolidation involving the right middle lobe and right lower lobe. Lungs otherwise clear. Small right pleural effusion. No left pleural effusion. Pulmonary vascularity normal. Visualized bony thorax intact. IMPRESSION: 1. Acute pneumonia involving the right middle lobe and right lower lobe. 2. Thoracic aortic atherosclerosis. Electronically Signed   By: Evangeline Dakin M.D.   On: 02/02/2017 17:41   Ct Head Wo Contrast  Result Date: 02/02/2017 CLINICAL DATA:  Fall, hitting back of head. Headache. Altered mental status. Initial encounter. EXAM: CT HEAD WITHOUT CONTRAST TECHNIQUE: Contiguous axial images were obtained from the base of the skull through the vertex without intravenous contrast. COMPARISON:  04/16/2011 FINDINGS: Brain: There is no evidence of acute cortical infarct, intracranial hemorrhage, mass, midline shift, or extra-axial fluid collection. Mild generalized cerebral atrophy is within normal limits for age.  Deep cerebral white matter hypodensities have mildly progressed and are nonspecific but compatible with mild-to-moderate chronic small vessel ischemic disease. Vascular: Calcified atherosclerosis at the skullbase. No hyperdense vessel. Skull: No fracture or focal osseous lesion. Sinuses/Orbits: Mild bilateral maxillary sinus mucosal thickening, incompletely imaged. Clear mastoid air cells. Prior bilateral cataract extraction. Other: None. IMPRESSION: 1. No evidence of acute intracranial abnormality. 2. Mild-to-moderate chronic small vessel ischemic disease, progressed from 2012. Electronically Signed   By: Logan Bores M.D.   On: 02/02/2017 16:16     [name], DO 02/02/2017, 5:34 PM PGY-1, [place] [facility] pager: [phone], text pages welcome  FPTS Upper-Level Resident Addendum  I have independently interviewed and examined the patient. I have discussed the above with the original author and agree with their documentation. My edits for correction/addition/clarification are in blue. Please see also any attending notes.   Hyman Bible, MD PGY-2, [facility] pager: [phone] (text pages welcome through [place])

## 2017-02-02 NOTE — ED Provider Notes (Signed)
[facility] DEPT [facility] Provider Note   CSN: [id number] Arrival date & time: 02/02/17  1319     History   Chief Complaint Chief Complaint  Patient presents with  . Fall  . Altered Mental Status    HPI Lauren [name] is a 66 y.o. female.  HPI  A LEVEL 5 CAVEAT PERTAINS DUE TO ALTERED MENTAL STATUS. Pt presenting with concern for confusion which has been worsening over the past several weeks.  As well as weakness and difficulty walking.  Husband states that approx 1 month ago her pain clinic began weaning her narcotic pain meds.  Over the past 2 weeks she has been increasingly confused and weak.  Over the past 3 days husband has noted fever.  She has also been coughing up a lot of mucous.  He tried mucinex without much relief.  Last night she got up and fell backwards, hitting the back of her head.  No neck or back pain.    Past Medical History:  Diagnosis Date  . Anemia   . Anxiety   . CAD (coronary artery disease)   . Carotid artery occlusion   . Cataract    bil cateracts removed  . COPD (chronic obstructive pulmonary disease) ([place])   . Cough   . DDD (degenerative disc disease), lumbar   . Depression   . Diabetes mellitus   . Endometrial ca [facility])    endometrial ca dx 4/11  . GERD (gastroesophageal reflux disease)   . Gout 2015  . Hernia of flank   . Hyperlipidemia   . Hypertension   . Insomnia   . OAB (overactive bladder)   . Osteoarthritis   . Peripheral neuropathy ([place])    following chemotherapy  . Psoriasis   . Pulmonary nodule    since 2016  . Radiation 05/01/2011-06/11/11   5600 cGy 28 fxs/periaortic  . Stroke ([place])    tia  . TIA (transient ischemic attack) 2015  . 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 Uterine cancer ([place])   . Ventral hernia    postoperative since 2009    Patient Active Problem List   Diagnosis Date Noted  . Sepsis ([place]) 02/02/2017  . Bilateral carotid artery stenosis 01/30/2017  . Aftercare following  surgery of the circulatory system 01/30/2017  . Impingement syndrome of left shoulder 12/17/2016  . Coronary artery calcification of native artery 10/07/2016  . Iron (Fe) deficiency anemia 10/07/2016  . DDD (degenerative disc disease), lumbar 10/03/2016  . Fatigue 10/03/2016  . Intervertebral disc disease 10/03/2016  . Low back pain 10/03/2016  . Myelopathy ([place]) 10/03/2016  . Prolapsed lumbosacral intervertebral disc 10/03/2016  . Spondylisthesis 10/03/2016  . MRSA (methicillin resistant staph aureus) culture positive 10/10/2015  . Carotid stenosis 08/17/2015  . Preoperative cardiovascular examination 08/10/2015  . Tobacco abuse 08/10/2015  . 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 Acute osteomyelitis of spine ([place]) 08/28/2014  . Difficult or painful urination 08/28/2014  . H/O Spinal surgery 08/28/2014  . Elevated WBC count 08/28/2014  . Encounter for aftercare for long-term (current) use of antibiotics 08/28/2014  . N&V (nausea and vomiting) 08/28/2014  . Bladder retention 08/28/2014  . Infection of urinary tract 08/28/2014  . DM2 (diabetes mellitus, type 2) ([place]) 06/15/2013  . Ventral hernia 06/15/2013  . COPD (chronic obstructive pulmonary disease) ([place]) 06/15/2013  . DJD (degenerative joint disease) of lumbar spine 06/15/2013  . Depression 06/15/2013  . Exertional angina ([place]) 06/15/2013  . Preoperative evaluation 06/15/2013  .  HTN (hypertension) 06/15/2013  . Occlusion and stenosis of carotid artery without mention of cerebral infarction 03/02/2012  . Malignant neoplasm of corpus uteri, except isthmus ([place]) 01/19/2012  . Abnormal EKG 12/07/2011  . Essential hypertension, benign 12/07/2011  . Hyperlipidemia with low HDL 12/07/2011  . Carotid artery disease without cerebral infarction ([place]) 12/07/2011    Past Surgical History:  Procedure Laterality Date  . ABDOMINAL HYSTERECTOMY  2009   for ca  . BACK SURGERY  Aug/sept 2014   3 back surgeries  (Aug 2014 & Sept 2014)  .  CHOLECYSTECTOMY     biliteral  . COLONOSCOPY  11/13/2014   negative except for a small cecal AVM and 1 polyp  . ENDARTERECTOMY Right 08/17/2015   Procedure: Right Carotid Endartarectomy with patch Angioplasty ;  Surgeon: Mal Misty, MD;  Location: [facility];  Service: Vascular;  Laterality: Right;  . EYE SURGERY Right Aug. 2013   Cataract, lens implant  . EYE SURGERY Left Sept. 2013   Cataract, lens implant  . I & D of lumbar fusion /removal of hardware for chronic infection  Jan. 8, 2015  . right carotid endarterectomy  2016  . SPINE SURGERY      OB History    No data available       Home Medications    Prior to Admission medications   Medication Sig Start Date End Date Taking? Authorizing Provider  Albuterol Sulfate (PROAIR RESPICLICK) [id number] (90 Base) MCG/ACT AEPB Inhale 2 puffs into the lungs 4 (four) times daily as needed (congestion, cough, wheezing).   Yes Historical Provider, MD  aspirin EC 81 MG tablet Take 81 mg by mouth daily.   Yes Historical Provider, MD  betamethasone dipropionate (DIPROLENE) 0.05 % ointment Apply 1 application topically 2 (two) times daily as needed (psoriasis).    Yes Historical Provider, MD  chlorthalidone (HYGROTON) 25 MG tablet Take 25 mg by mouth daily.    Yes Historical Provider, MD  Cholecalciferol (VITAMIN D3) 1000 UNITS CAPS Take 1,000 Units by mouth daily.    Yes Historical Provider, MD  Cyanocobalamin (VITAMIN B-12 CR) 1500 MCG TBCR Take 1,500 mcg by mouth daily.    Yes Historical Provider, MD  escitalopram (LEXAPRO) 20 MG tablet Take 20 mg by mouth daily.   Yes Historical Provider, MD  esomeprazole (NEXIUM) 40 MG capsule Take 40 mg by mouth daily.    Yes Historical Provider, MD  ferrous sulfate 325 (65 FE) MG tablet Take 325 mg by mouth 2 (two) times daily with a meal.   Yes Historical Provider, MD  Fluticasone-Salmeterol (ADVAIR) 250-50 MCG/DOSE AEPB Inhale 1 puff into the lungs every 12 (twelve) hours.     Yes Historical Provider, MD  folic  acid (FOLVITE) 1 MG tablet Take 1 mg by mouth daily.   Yes Historical Provider, MD  gabapentin (NEURONTIN) 600 MG tablet Take 600 mg by mouth 6 (six) times daily. 6am, 10am, 4pm, 6pm, 10pm, midnight   Yes Historical Provider, MD  insulin glargine (LANTUS) 100 UNIT/ML injection Inject 35 Units into the skin daily after supper.    Yes Historical Provider, MD  losartan (COZAAR) 100 MG tablet Take 100 mg by mouth daily.     Yes Historical Provider, MD  magnesium oxide (MAG-OX) 400 MG tablet Take 400 mg by mouth daily.   Yes Historical Provider, MD  Melatonin 5 MG TABS Take 10 mg by mouth at bedtime.   Yes Historical Provider, MD  metaxalone (SKELAXIN) 800 MG tablet Take 800 mg  by mouth 3 (three) times daily. 6am, noon, 6pm   Yes Historical Provider, MD  metFORMIN (GLUCOPHAGE) 1000 MG tablet Take 1,000 mg by mouth 2 (two) times daily. 11/28/16  Yes Historical Provider, MD  methotrexate (RHEUMATREX) 2.5 MG tablet Take 7.5 mg by mouth See admin instructions. Take 3 tablets (7.5 mg) every Tuesday morning and night (6 tablets total)Caution:Chemotherapy. Protect from light.   Yes Historical Provider, MD  metoprolol succinate (TOPROL-XL) 50 MG 24 hr tablet Take 50 mg by mouth daily. 11/21/16  Yes Historical Provider, MD  Multiple Vitamin (MULTIVITAMIN WITH MINERALS) TABS tablet Take 1 tablet by mouth daily.   Yes Historical Provider, MD  nitrofurantoin (MACRODANTIN) 100 MG capsule Take 100 mg by mouth at bedtime.    Yes Historical Provider, MD  nitroGLYCERIN (NITROSTAT) 0.4 MG SL tablet Place 1 tablet (0.4 mg total) under the tongue every 5 (five) minutes as needed for chest pain. 06/15/13  Yes Mihai Croitoru, MD  nortriptyline (PAMELOR) 10 MG capsule Take 20 mg by mouth at bedtime.    Yes Historical Provider, MD  ondansetron (ZOFRAN-ODT) 4 MG disintegrating tablet Take 4 mg by mouth every 8 (eight) hours as needed for nausea or vomiting.  09/16/16  Yes Historical Provider, MD  OXYCONTIN 30 MG 12 hr tablet Take 30  mg by mouth every 12 (twelve) hours. 01/30/17  Yes Historical Provider, MD  potassium chloride (KLOR-CON) 10 MEQ CR tablet Take 10 mEq by mouth daily.     Yes Historical Provider, MD  Probiotic Product (PROBIOTIC PO) Take 1 tablet by mouth at bedtime.   Yes Historical Provider, MD  rosuvastatin (CRESTOR) 5 MG tablet Take 5 mg by mouth at bedtime. 01/13/17  Yes Historical Provider, MD  tapentadol (NUCYNTA) 50 MG tablet Take 50 mg by mouth every 6 (six) hours.   Yes Historical Provider, MD  varenicline (CHANTIX) 1 MG tablet Take 1 mg by mouth daily.    Yes Historical Provider, MD  glucose blood (ACCU-CHEK AVIVA PLUS) test strip 1 each by Other route as needed. Use as instructed     Historical Provider, MD  oxyCODONE-acetaminophen (PERCOCET) 10-325 MG per tablet Take 1 tablet by mouth every 4 (four) hours as needed for pain. Patient not taking: Reported on 12/17/2016 08/17/15   [name], PA-C    Family History Family History  Problem Relation Age of Onset  . Stroke Mother   . Irritable bowel syndrome Mother   . Heart attack Father 24    died  . Heart disease Father     Heart Disease before age 53, rheumatic heart disease  . Hyperlipidemia Father   . Hypertension Father   . Diabetes Daughter   . Hypertension Daughter   . Irritable bowel syndrome Daughter   . Colon cancer Neg Hx   . Esophageal cancer Neg Hx   . Pancreatic cancer Neg Hx   . Rectal cancer Neg Hx   . Stomach cancer Neg Hx     Social History Social History  Substance Use Topics  . Smoking status: Light Tobacco Smoker    Packs/day: 0.10    Years: 40.00    Types: Cigarettes  . Smokeless tobacco: Never Used     Comment: stated smoking 1 cigarette/ day; trying to quit  . Alcohol use No     Allergies   Ambien [zolpidem tartrate]; Sulfa antibiotics; Elavil [amitriptyline hcl]; and Prozac [fluoxetine hcl]   Review of Systems Review of Systems  UNABLE TO OBTAIN ROS DUE TO LEVEL 5  CAVEAT   Physical Exam Updated  Vital Signs BP 168/72   Pulse 92   Temp 98.3 F (36.8 C) (Oral)   Resp 20   Ht 5\' 5"  (1.651 m)   Wt 167 lb (75.8 kg)   SpO2 95%   BMI 27.79 kg/m  Vitals reviewed Physical Exam Physical Examination: General appearance - alert, well appearing, and in no distress Mental status - alert, oriented to person, not to place or time Eyes - pupils equal and reactive, extraocular eye movements intact Mouth - mucous membranes moist, pharynx normal without lesions Chest - clear to auscultation, no wheezes, rales or rhonchi, symmetric air entry Heart - normal rate, regular rhythm, normal S1, S2, no murmurs, rubs, clicks or gallops Abdomen - soft, nontender, nondistended, no masses or organomegaly Neurological - alert but tired appearing, oriented x 1, talking pleasantly, moving all extremities, strength 5/5 in extremities x 4, no cranial nerve defect Extremities - peripheral pulses normal, no pedal edema, no clubbing or cyanosis Skin - normal coloration and turgor, no rashes  ED Treatments / Results  Labs (all labs ordered are listed, but only abnormal results are displayed) Labs Reviewed  CBC - Abnormal; Notable for the following:       Result Value   WBC 20.2 (*)    RBC 3.43 (*)    Hemoglobin 9.7 (*)    HCT 29.4 (*)    RDW 18.7 (*)    Platelets 430 (*)    All other components within normal limits  COMPREHENSIVE METABOLIC PANEL - Abnormal; Notable for the following:    Sodium 133 (*)    Potassium 3.3 (*)    Chloride 95 (*)    BUN 23 (*)    Creatinine, Ser 1.07 (*)    Total Protein 6.3 (*)    Albumin 2.7 (*)    GFR calc non Af Amer 53 (*)    All other components within normal limits  URINALYSIS, ROUTINE W REFLEX MICROSCOPIC - Abnormal; Notable for the following:    APPearance HAZY (*)    Hgb urine dipstick SMALL (*)    Ketones, ur 5 (*)    Protein, ur 30 (*)    Leukocytes, UA TRACE (*)    Bacteria, UA RARE (*)    All other components within normal limits  RAPID URINE DRUG  SCREEN, HOSP PERFORMED - Abnormal; Notable for the following:    Opiates POSITIVE (*)    All other components within normal limits  BASIC METABOLIC PANEL - Abnormal; Notable for the following:    Potassium 3.0 (*)    Glucose, Bld 58 (*)    Calcium 8.5 (*)    All other components within normal limits  CBC - Abnormal; Notable for the following:    WBC 18.4 (*)    RBC 3.28 (*)    Hemoglobin 9.0 (*)    HCT 28.1 (*)    RDW 18.8 (*)    Platelets 403 (*)    All other components within normal limits  CBG MONITORING, ED - Abnormal; Notable for the following:    Glucose-Capillary 47 (*)    All other components within normal limits  CBG MONITORING, ED - Abnormal; Notable for the following:    Glucose-Capillary 51 (*)    All other components within normal limits  CBG MONITORING, ED - Abnormal; Notable for the following:    Glucose-Capillary 157 (*)    All other components within normal limits  CBG MONITORING, ED - Abnormal; Notable for the following:  Glucose-Capillary 121 (*)    All other components within normal limits  CULTURE, BLOOD (ROUTINE X 2)  CULTURE, BLOOD (ROUTINE X 2)  URINE CULTURE  ETHANOL  INFLUENZA PANEL BY PCR (TYPE A & B)  I-STAT CG4 LACTIC ACID, ED  I-STAT CG4 LACTIC ACID, ED    EKG  EKG Interpretation  Date/Time:  Monday February 02 2017 13:50:23 EST Ventricular Rate:  100 PR Interval:    QRS Duration: 95 QT Interval:  338 QTC Calculation: 436 R Axis:   74 Text Interpretation:  Sinus tachycardia Nonspecific repol abnormality, diffuse leads repol abnormality is more prominent compared to prior ekg Confirmed by [facility]  MD, Olyn Landstrom [phone]) on 02/02/2017 2:31:30 PM       Radiology Dg Chest 2 View  Result Date: 02/02/2017 CLINICAL DATA:  66 year old with multiple recent falls, including a fall at home earlier today. Current smoker. Patient currently awaiting back surgery and is being weaned from pain medicines. EXAM: CHEST  2 VIEW COMPARISON:  CT chest  09/04/2016, 02/23/2015 and earlier. FINDINGS: Cardiac silhouette normal in size, unchanged. Thoracic aorta atherosclerotic, unchanged. Hilar and mediastinal contours otherwise unremarkable. Airspace consolidation involving the right middle lobe and right lower lobe. Lungs otherwise clear. Small right pleural effusion. No left pleural effusion. Pulmonary vascularity normal. Visualized bony thorax intact. IMPRESSION: 1. Acute pneumonia involving the right middle lobe and right lower lobe. 2. Thoracic aortic atherosclerosis. Electronically Signed   By: Evangeline Dakin M.D.   On: 02/02/2017 17:41   Ct Head Wo Contrast  Result Date: 02/02/2017 CLINICAL DATA:  Fall, hitting back of head. Headache. Altered mental status. Initial encounter. EXAM: CT HEAD WITHOUT CONTRAST TECHNIQUE: Contiguous axial images were obtained from the base of the skull through the vertex without intravenous contrast. COMPARISON:  04/16/2011 FINDINGS: Brain: There is no evidence of acute cortical infarct, intracranial hemorrhage, mass, midline shift, or extra-axial fluid collection. Mild generalized cerebral atrophy is within normal limits for age. Deep cerebral white matter hypodensities have mildly progressed and are nonspecific but compatible with mild-to-moderate chronic small vessel ischemic disease. Vascular: Calcified atherosclerosis at the skullbase. No hyperdense vessel. Skull: No fracture or focal osseous lesion. Sinuses/Orbits: Mild bilateral maxillary sinus mucosal thickening, incompletely imaged. Clear mastoid air cells. Prior bilateral cataract extraction. Other: None. IMPRESSION: 1. No evidence of acute intracranial abnormality. 2. Mild-to-moderate chronic small vessel ischemic disease, progressed from 2012. Electronically Signed   By: [place] Bores M.D.   On: 02/02/2017 16:16    Procedures Procedures (including critical care time)  Medications Ordered in ED Medications  piperacillin-tazobactam (ZOSYN) IVPB 3.375 g (3.375  g Intravenous New Bag/Given 02/02/17 2330)  vancomycin (VANCOCIN) IVPB 750 mg/150 ml premix (750 mg Intravenous Given 02/03/17 0552)  0.9 %  sodium chloride infusion ( Intravenous New Bag/Given 02/03/17 0100)  albuterol (PROVENTIL) (2.5 MG/3ML) 0.083% nebulizer solution 2.5 mg (not administered)  aspirin EC tablet 81 mg (not administered)  ferrous sulfate tablet 325 mg (not administered)  Melatonin TABS 9 mg (9 mg Oral Given 02/03/17 0106)  rosuvastatin (CRESTOR) tablet 5 mg (5 mg Oral Given 02/03/17 0106)  potassium chloride (K-DUR,KLOR-CON) CR tablet 10 mEq (not administered)  gabapentin (NEURONTIN) capsule 600 mg (600 mg Oral Given 02/03/17 0712)  mometasone-formoterol (DULERA) 200-5 MCG/ACT inhaler 2 puff (2 puffs Inhalation Given 02/03/17 0110)  nortriptyline (PAMELOR) capsule 20 mg (20 mg Oral Given 02/03/17 0106)  pantoprazole (PROTONIX) EC tablet 40 mg (not administered)  nitroGLYCERIN (NITROSTAT) SL tablet 0.4 mg (not administered)  escitalopram (LEXAPRO) tablet 20 mg (  not administered)  magnesium oxide (MAG-OX) tablet 400 mg (not administered)  varenicline (CHANTIX) tablet 1 mg (not administered)  ondansetron (ZOFRAN-ODT) disintegrating tablet 4 mg (not administered)  metaxalone (SKELAXIN) tablet 800 mg (800 mg Oral Given 02/03/17 0106)  tapentadol (NUCYNTA) tablet 50 mg (not administered)  oxyCODONE (OXYCONTIN) 12 hr tablet 30 mg (0 mg Oral Hold 02/03/17 0100)  enoxaparin (LOVENOX) injection 40 mg (not administered)  sodium chloride flush (NS) 0.9 % injection 3 mL (3 mLs Intravenous Given 02/03/17 0100)  0.9 %  sodium chloride infusion ( Intravenous Not Given 02/03/17 0100)  acetaminophen (TYLENOL) tablet 650 mg (not administered)    Or  acetaminophen (TYLENOL) suppository 650 mg (not administered)  polyethylene glycol (MIRALAX / GLYCOLAX) packet 17 g (not administered)  insulin aspart (novoLOG) injection 0-9 Units (not administered)  ipratropium-albuterol (DUONEB) 0.5-2.5 (3) MG/3ML nebulizer  solution 3 mL (3 mLs Nebulization Given 02/03/17 0119)  albuterol (PROVENTIL) (2.5 MG/3ML) 0.083% nebulizer solution 2.5 mg (not administered)  acetaminophen (TYLENOL) tablet 1,000 mg (1,000 mg Oral Given 02/02/17 1641)  sodium chloride 0.9 % bolus 1,000 mL (0 mLs Intravenous Stopped 02/02/17 1731)  sodium chloride 0.9 % bolus 1,000 mL (0 mLs Intravenous Stopped 02/02/17 1701)    And  sodium chloride 0.9 % bolus 1,000 mL (0 mLs Intravenous Stopped 02/02/17 1737)    And  sodium chloride 0.9 % bolus 500 mL (0 mLs Intravenous Stopped 02/02/17 1807)  piperacillin-tazobactam (ZOSYN) IVPB 3.375 g (0 g Intravenous Stopped 02/02/17 1706)  vancomycin (VANCOCIN) 1,500 mg in sodium chloride 0.9 % 500 mL IVPB (0 mg Intravenous Stopped 02/02/17 1858)  potassium chloride SA (K-DUR,KLOR-CON) CR tablet 40 mEq (40 mEq Oral Given 02/02/17 2342)  dextrose 50 % solution 50 mL (50 mLs Intravenous Given 02/03/17 0405)   CRITICAL CARE Performed by: Threasa Beards Total critical care time: 50 minutes Critical care time was exclusive of separately billable procedures and treating other patients. Critical care was necessary to treat or prevent imminent or life-threatening deterioration. Critical care was time spent personally by me on the following activities: development of treatment plan with patient and/or surrogate as well as nursing, discussions with consultants, evaluation of patient's response to treatment, examination of patient, obtaining history from patient or surrogate, ordering and performing treatments and interventions, ordering and review of laboratory studies, ordering and review of radiographic studies, pulse oximetry and re-evaluation of patient's condition.  Initial Impression / Assessment and Plan / ED Course  I have reviewed the triage vital signs and the nursing notes.  Pertinent labs & imaging results that were available during my care of the patient were reviewed by me and considered in my medical decision  making (see chart for details).   code sepsis initiated once fever obtained.  Pt with fever, HR > 90- therefore code sepsis started.  Pt treated with fluid bolus, IV abx.  Her lactate is reassuring, no hypotension so not in septic shock.  Will continue to monitor closely.  WBC resulted at 20K, awaiting CXR.  Pt is on prophylactic abx for chronic UTIs- no definite acute UTI on UA today- urine culture ordered as well.    5:36 PM d/w family practice resident, pt to be admitted to stepdown unit.  Dr. Ree Kida, attending.      Final Clinical Impressions(s) / ED Diagnoses   Final diagnoses:  Sepsis, due to unspecified organism ([place])  Altered mental status, unspecified altered mental status type  Febrile illness  Community acquired pneumonia, unspecified laterality  Minor head injury, initial  encounter  Fall, initial encounter    New Prescriptions New Prescriptions   No medications on file     Alfonzo Beers, MD 02/03/17 [phone]

## 2017-02-03 ENCOUNTER — Observation Stay (HOSPITAL_COMMUNITY): Payer: Medicare Other

## 2017-02-03 DIAGNOSIS — D649 Anemia, unspecified: Secondary | ICD-10-CM | POA: Diagnosis present

## 2017-02-03 DIAGNOSIS — J189 Pneumonia, unspecified organism: Secondary | ICD-10-CM | POA: Diagnosis present

## 2017-02-03 DIAGNOSIS — W19XXXA Unspecified fall, initial encounter: Secondary | ICD-10-CM | POA: Diagnosis not present

## 2017-02-03 DIAGNOSIS — E876 Hypokalemia: Secondary | ICD-10-CM | POA: Diagnosis present

## 2017-02-03 DIAGNOSIS — K644 Residual hemorrhoidal skin tags: Secondary | ICD-10-CM | POA: Diagnosis present

## 2017-02-03 DIAGNOSIS — Z981 Arthrodesis status: Secondary | ICD-10-CM | POA: Diagnosis not present

## 2017-02-03 DIAGNOSIS — G9340 Encephalopathy, unspecified: Secondary | ICD-10-CM | POA: Diagnosis present

## 2017-02-03 DIAGNOSIS — J440 Chronic obstructive pulmonary disease with acute lower respiratory infection: Secondary | ICD-10-CM | POA: Diagnosis present

## 2017-02-03 DIAGNOSIS — G8929 Other chronic pain: Secondary | ICD-10-CM

## 2017-02-03 DIAGNOSIS — J181 Lobar pneumonia, unspecified organism: Secondary | ICD-10-CM

## 2017-02-03 DIAGNOSIS — A419 Sepsis, unspecified organism: Secondary | ICD-10-CM | POA: Diagnosis present

## 2017-02-03 DIAGNOSIS — R159 Full incontinence of feces: Secondary | ICD-10-CM | POA: Diagnosis present

## 2017-02-03 DIAGNOSIS — Z8542 Personal history of malignant neoplasm of other parts of uterus: Secondary | ICD-10-CM | POA: Diagnosis not present

## 2017-02-03 DIAGNOSIS — E11649 Type 2 diabetes mellitus with hypoglycemia without coma: Secondary | ICD-10-CM | POA: Diagnosis present

## 2017-02-03 DIAGNOSIS — R29898 Other symptoms and signs involving the musculoskeletal system: Secondary | ICD-10-CM | POA: Diagnosis not present

## 2017-02-03 DIAGNOSIS — Z9842 Cataract extraction status, left eye: Secondary | ICD-10-CM | POA: Diagnosis not present

## 2017-02-03 DIAGNOSIS — I10 Essential (primary) hypertension: Secondary | ICD-10-CM | POA: Diagnosis present

## 2017-02-03 DIAGNOSIS — R4182 Altered mental status, unspecified: Secondary | ICD-10-CM | POA: Diagnosis present

## 2017-02-03 DIAGNOSIS — E785 Hyperlipidemia, unspecified: Secondary | ICD-10-CM | POA: Diagnosis present

## 2017-02-03 DIAGNOSIS — F329 Major depressive disorder, single episode, unspecified: Secondary | ICD-10-CM | POA: Diagnosis present

## 2017-02-03 DIAGNOSIS — L89622 Pressure ulcer of left heel, stage 2: Secondary | ICD-10-CM | POA: Diagnosis present

## 2017-02-03 DIAGNOSIS — Z79891 Long term (current) use of opiate analgesic: Secondary | ICD-10-CM | POA: Diagnosis not present

## 2017-02-03 DIAGNOSIS — M4620 Osteomyelitis of vertebra, site unspecified: Secondary | ICD-10-CM | POA: Diagnosis present

## 2017-02-03 DIAGNOSIS — F1120 Opioid dependence, uncomplicated: Secondary | ICD-10-CM | POA: Diagnosis not present

## 2017-02-03 DIAGNOSIS — F419 Anxiety disorder, unspecified: Secondary | ICD-10-CM | POA: Diagnosis present

## 2017-02-03 DIAGNOSIS — Z85830 Personal history of malignant neoplasm of bone: Secondary | ICD-10-CM | POA: Diagnosis not present

## 2017-02-03 DIAGNOSIS — F17210 Nicotine dependence, cigarettes, uncomplicated: Secondary | ICD-10-CM | POA: Diagnosis present

## 2017-02-03 DIAGNOSIS — Z923 Personal history of irradiation: Secondary | ICD-10-CM | POA: Diagnosis not present

## 2017-02-03 DIAGNOSIS — I2510 Atherosclerotic heart disease of native coronary artery without angina pectoris: Secondary | ICD-10-CM | POA: Diagnosis present

## 2017-02-03 LAB — CBG MONITORING, ED
GLUCOSE-CAPILLARY: 157 mg/dL — AB (ref 65–99)
GLUCOSE-CAPILLARY: 51 mg/dL — AB (ref 65–99)
GLUCOSE-CAPILLARY: 121 mg/dL — AB (ref 65–99)
Glucose-Capillary: 47 mg/dL — ABNORMAL LOW (ref 65–99)
Glucose-Capillary: 124 mg/dL — ABNORMAL HIGH (ref 65–99)
Glucose-Capillary: 122 mg/dL — ABNORMAL HIGH (ref 65–99)

## 2017-02-03 LAB — BLOOD CULTURE ID PANEL (REFLEXED)
ACINETOBACTER BAUMANNII: NOT DETECTED
CANDIDA ALBICANS: NOT DETECTED
Candida glabrata: NOT DETECTED
Candida krusei: NOT DETECTED
Candida parapsilosis: NOT DETECTED
Candida tropicalis: NOT DETECTED
ENTEROBACTERIACEAE SPECIES: NOT DETECTED
ENTEROCOCCUS SPECIES: NOT DETECTED
Enterobacter cloacae complex: NOT DETECTED
Escherichia coli: NOT DETECTED
HAEMOPHILUS INFLUENZAE: NOT DETECTED
Klebsiella oxytoca: NOT DETECTED
Klebsiella pneumoniae: NOT DETECTED
LISTERIA MONOCYTOGENES: NOT DETECTED
METHICILLIN RESISTANCE: NOT DETECTED
NEISSERIA MENINGITIDIS: NOT DETECTED
PSEUDOMONAS AERUGINOSA: NOT DETECTED
Proteus species: NOT DETECTED
SERRATIA MARCESCENS: NOT DETECTED
STAPHYLOCOCCUS AUREUS BCID: NOT DETECTED
STREPTOCOCCUS AGALACTIAE: NOT DETECTED
STREPTOCOCCUS PYOGENES: NOT DETECTED
STREPTOCOCCUS SPECIES: NOT DETECTED
Staphylococcus species: DETECTED — AB
Streptococcus pneumoniae: NOT DETECTED

## 2017-02-03 LAB — CBC
HEMATOCRIT: 28.1 % — AB (ref 36.0–46.0)
HEMOGLOBIN: 9 g/dL — AB (ref 12.0–15.0)
MCH: 27.4 pg (ref 26.0–34.0)
MCHC: 32 g/dL (ref 30.0–36.0)
MCV: 85.7 fL (ref 78.0–100.0)
Platelets: 403 10*3/uL — ABNORMAL HIGH (ref 150–400)
RBC: 3.28 MIL/uL — ABNORMAL LOW (ref 3.87–5.11)
RDW: 18.8 % — AB (ref 11.5–15.5)
WBC: 18.4 10*3/uL — ABNORMAL HIGH (ref 4.0–10.5)

## 2017-02-03 LAB — BASIC METABOLIC PANEL
ANION GAP: 12 (ref 5–15)
BUN: 15 mg/dL (ref 6–20)
CHLORIDE: 103 mmol/L (ref 101–111)
CO2: 22 mmol/L (ref 22–32)
Calcium: 8.5 mg/dL — ABNORMAL LOW (ref 8.9–10.3)
Creatinine, Ser: 0.81 mg/dL (ref 0.44–1.00)
GFR calc Af Amer: >60 mL/min (ref >60)
GFR calc non Af Amer: >60 mL/min (ref >60)
Glucose, Bld: 58 mg/dL — ABNORMAL LOW (ref 65–99)
POTASSIUM: 3 mmol/L — AB (ref 3.5–5.1)
SODIUM: 137 mmol/L (ref 135–145)

## 2017-02-03 LAB — INFLUENZA PANEL BY PCR (TYPE A & B)
Influenza A By PCR: NEGATIVE
Influenza B By PCR: NEGATIVE

## 2017-02-03 LAB — MRSA PCR SCREENING: MRSA BY PCR: NEGATIVE

## 2017-02-03 LAB — URINE CULTURE: Culture: NO GROWTH

## 2017-02-03 LAB — GLUCOSE, CAPILLARY: Glucose-Capillary: 152 mg/dL — ABNORMAL HIGH (ref 65–99)

## 2017-02-03 MED ORDER — DEXTROSE 50 % IV SOLN
50.0000 mL | Freq: Once | INTRAVENOUS | Status: AC
  Administered 2017-02-03: 50 mL via INTRAVENOUS
  Filled 2017-02-03: qty 50

## 2017-02-03 MED ORDER — CHLORTHALIDONE 25 MG PO TABS
25.0000 mg | ORAL_TABLET | Freq: Every day | ORAL | Status: DC
  Filled 2017-02-03: qty 1

## 2017-02-03 MED ORDER — IPRATROPIUM-ALBUTEROL 0.5-2.5 (3) MG/3ML IN SOLN
3.0000 mL | Freq: Three times a day (TID) | RESPIRATORY_TRACT | Status: DC
  Administered 2017-02-04 – 2017-02-05 (×4): 3 mL via RESPIRATORY_TRACT
  Filled 2017-02-03 (×5): qty 3

## 2017-02-03 NOTE — Progress Notes (Signed)
PHARMACY - PHYSICIAN COMMUNICATION CRITICAL VALUE ALERT - BLOOD CULTURE IDENTIFICATION (BCID)  Results for orders placed or performed during the hospital encounter of 02/02/17  Blood Culture ID Panel (Reflexed) (Collected: 02/02/2017  3:40 PM)  Result Value Ref Range   Enterococcus species NOT DETECTED NOT DETECTED   Listeria monocytogenes NOT DETECTED NOT DETECTED   Staphylococcus species DETECTED (A) NOT DETECTED   Staphylococcus aureus NOT DETECTED NOT DETECTED   Methicillin resistance NOT DETECTED NOT DETECTED   Streptococcus species NOT DETECTED NOT DETECTED   Streptococcus agalactiae NOT DETECTED NOT DETECTED   Streptococcus pneumoniae NOT DETECTED NOT DETECTED   Streptococcus pyogenes NOT DETECTED NOT DETECTED   Acinetobacter baumannii NOT DETECTED NOT DETECTED   Enterobacteriaceae species NOT DETECTED NOT DETECTED   Enterobacter cloacae complex NOT DETECTED NOT DETECTED   Escherichia coli NOT DETECTED NOT DETECTED   Klebsiella oxytoca NOT DETECTED NOT DETECTED   Klebsiella pneumoniae NOT DETECTED NOT DETECTED   Proteus species NOT DETECTED NOT DETECTED   Serratia marcescens NOT DETECTED NOT DETECTED   Haemophilus influenzae NOT DETECTED NOT DETECTED   Neisseria meningitidis NOT DETECTED NOT DETECTED   Pseudomonas aeruginosa NOT DETECTED NOT DETECTED   Candida albicans NOT DETECTED NOT DETECTED   Candida glabrata NOT DETECTED NOT DETECTED   Candida krusei NOT DETECTED NOT DETECTED   Candida parapsilosis NOT DETECTED NOT DETECTED   Candida tropicalis NOT DETECTED NOT DETECTED    Name of physician (or Provider) Contacted: Dr. Burr Medico ([place])  Changes to prescribed antibiotics required: none, suspect contaminant.   Vincenza Hews, PharmD, BCPS 02/03/2017, 11:04 AM

## 2017-02-03 NOTE — Progress Notes (Signed)
Family Medicine Teaching Service Daily Progress Note Intern Pager: [phone]  Patient name: Lauren [name] Medical record number: [id number] Date of birth: April 05, 1951 Age: 66 y.o. Gender: female  Primary Care Provider: Marylene Land, MD Consultants: None Code Status: Full  Pt Overview and Major Events to Date:  02/02/2017 - Admit for AMS, sepsis  Assessment and Plan: Lauren [name] is a 66 y.o. female presenting with AMS and sepsis. PMH is significant for COPD, CAD, Hyperlipidemia, HTN, T2DM, spinal cord tumor, DJD, depression, tobacco abuse, anemia  Sepsis 2/2 to CAP- SIRS met on admission with leukocytosis, fever, tachycardia. CXR with RLL and RML pneumonia.  Rec'd 3.5L IVF per sepsis protocol, BP stable.  Lactic acid 0.9 > 0.56. Satting well on RA this AM. -vitals per floor -up with assistance - continue vanc and zosyn per pharmacy consult (started 2/5), can de-escalate to CTX and azithro after 24h  -blood cultures pending  -pain management as below -zofran as needed for nausea  AMS- Per husband, Pt has had confusion and slurred speech for the last few months. Fell and hit head last night. UDS + for opiates only.  Suspect this is secondary to sepsis versus sequelae of high doses of pain medications. Patient oriented to person and place, year (not month), cannot name president. -treat pneumonia as above -monitor for improvement - head CT no bleed  Hypokalemia: K 3.3 on admission now 3.0 because did not receive ordered KDUR while in ED - Replace with K-dur 18mq - monitor BMP  Chronic back pain- patient managed by pain clinic who has recently been trying to titrate down pain medications. 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 S/p L4-L5 and L5-S1 spinal fusion, laminectomy, and decompression of the thecal sac in 06/2013. Osteomyelitis in 07/2013. Removal of the hardware in 12/2013, irrigation and debridement,  and revision of the lumbar laminectomy.  - Currently using 170 morphine equivalents daily. Pain clinic trying to wean her. -continue home regimen- oxycontin 387mq12hrs, Tapentadol 5064m6hrs, Gabapentin 600m23mx times daily, metaxalone 800mg5m, Nortriptyline 20mg 68m- patient noted to have 4/5 LE strength with slightly decreased tone and c/o new fecal incontinence over past couple of weeks -- will order MRI to r/o cauda equina syndrome  COPD- patient uses albuterol inhaler as needed as home, also uses advair daily. Mild diffuse wheezing on exam with good air movement. Low suspicion of COPD exacerbation given current presentation. -duonebs q6 hours scheduled and albuterol q2hrs prn -dulera BID -consider adding steroids if patient does not improve with treatment of pneumonia -oxygen as above  HTN- BP low/normal on admission. Now improved. Takes 25 mg chlorthalidone, losartan 100mg, 47mtoprol-xl 50mg da77m- initially home meds held due to sepsis - can restart as needed  T2DM, hypoglycemia due to no PO overnight, now improved - takes 35 units lantus/daily, metformin 1000 BID -hold home metformin -lantus 15 units every night - held for now until tol PO given low CBG to 51 o/n, improved to 157 this AM -sensitive sliding scale  CAD- daily aspirin and SL nitroglycerin as needed -continue aspirin  HLD- on crestor 5mg -con19mue home statin  Depression- taking lexapro 20mg dail56mortriptyline 20 mg qhs -continue home psych meds  Tobacco abuse- currently on Chantix daily. Smokes about 1 cigarette a day. -continue chantix  Anemia- takes daily iron, hemoglobin 9.0 from 9.7 on admission, baseline appears to be 10-12 per past chart review.  -continue iron -monitor  CBC -transfusion threshold < 8  FEN/GI: heart healthy carb modified diet, NS @ 75/hr Prophylaxis: Lovenox  Disposition: Admitting to FPTS  Subjective:  Patient still in ED this AM. Oriented to person and place,  thinks its august 2018, cannot name president. Denies pain. Feels generalized weakness. Not in any distress, no events overnight.  Objective: Temp:  [98.3 F (36.8 C)-101.6 F (38.7 C)] 98.3 F (36.8 C) (02/06 0335) Pulse Rate:  [68-109] 91 (02/06 0804) Resp:  [12-33] 18 (02/06 0804) BP: (97-174)/(38-139) 139/83 (02/06 0804) SpO2:  [91 %-100 %] 98 % (02/06 0804) Weight:  [167 lb (75.8 kg)] 167 lb (75.8 kg) (02/05 1331) Physical Exam: General: NAD, tremulous and weak, rests comfortably Cardiovascular: RRR, no m/r/g Respiratory: diffuse wheezes, no rhonchi or rales, good air movement  Abdomen: soft and nonteder Extremities: no LE edema Neuro: 4/5 LE weakness, UE tremulous 4/5 strength in upper arms, 5/5 in hands, CN II-XII grossly intact  Laboratory:  Recent Labs Lab 02/02/17 1540 02/03/17 0225  WBC 20.2* 18.4*  HGB 9.7* 9.0*  HCT 29.4* 28.1*  PLT 430* 403*    Recent Labs Lab 02/02/17 1540 02/03/17 0225  NA 133* 137  K 3.3* 3.0*  CL 95* 103  CO2 24 22  BUN 23* 15  CREATININE 1.07* 0.81  CALCIUM 9.1 8.5*  PROT 6.3*  --   BILITOT 0.7  --   ALKPHOS 68  --   ALT 19  --   AST 23  --   GLUCOSE 82 58*    Imaging/Diagnostic Tests: Dg Chest 2 View 02/02/2017 FINDINGS: Cardiac silhouette normal in size, unchanged. Thoracic aorta atherosclerotic, unchanged. Hilar and mediastinal contours otherwise unremarkable. Airspace consolidation involving the right middle lobe and right lower lobe. Lungs otherwise clear. Small right pleural effusion. No left pleural effusion. Pulmonary vascularity normal. Visualized bony thorax intact.   IMPRESSION:  1. Acute pneumonia involving the right middle lobe and right lower lobe.  2. Thoracic aortic atherosclerosis.  Ct Head Wo Contrast  Result Date: 02/02/2017  FINDINGS: Brain: There is no evidence of acute cortical infarct, intracranial hemorrhage, mass, midline shift, or extra-axial fluid collection. Mild generalized cerebral atrophy is  within normal limits for age. Deep cerebral white matter hypodensities have mildly progressed and are nonspecific but compatible with mild-to-moderate chronic small vessel ischemic disease. Vascular: Calcified atherosclerosis at the skullbase. No hyperdense vessel. Skull: No fracture or focal osseous lesion. Sinuses/Orbits: Mild bilateral maxillary sinus mucosal thickening, incompletely imaged. Clear mastoid air cells. Prior bilateral cataract extraction. Other: None.  IMPRESSION:  1. No evidence of acute intracranial abnormality.  2. Mild-to-moderate chronic small vessel ischemic disease, progressed from 2012.   Everrett Coombe, MD 02/03/2017, 9:58 AM PGY-1, [place] Intern pager: [phone], text pages welcome

## 2017-02-03 NOTE — ED Notes (Signed)
Pt is alert and oriented, able to answer questions appropriately and follow commands.

## 2017-02-03 NOTE — ED Notes (Signed)
Husband's Phone #: [phone]

## 2017-02-03 NOTE — ED Notes (Signed)
Patient  incont . Of urine and bowel states this has been going on for 2 weeks. Patient is alert oriented , patient has been incont of stool x 2 for me. Patient was cleaned and linens changed.

## 2017-02-03 NOTE — ED Notes (Signed)
Patient is currently confused and only orientated to herself.  Can follow commands and swallow on her own.  Airway is protected.  Will continue monitor patient closely.

## 2017-02-03 NOTE — ED Notes (Addendum)
Pt alert, speaking clearly and answering questions appropriately.

## 2017-02-03 NOTE — ED Notes (Signed)
Pt lethargic, diaphoretic, and slurring words, admitting MD notified, verbal orders received.

## 2017-02-03 NOTE — ED Notes (Signed)
Pt given 4 oz. Orange juice and graham cracker.

## 2017-02-04 ENCOUNTER — Inpatient Hospital Stay (HOSPITAL_COMMUNITY): Payer: Medicare Other

## 2017-02-04 DIAGNOSIS — R4182 Altered mental status, unspecified: Secondary | ICD-10-CM | POA: Diagnosis present

## 2017-02-04 DIAGNOSIS — J189 Pneumonia, unspecified organism: Secondary | ICD-10-CM

## 2017-02-04 DIAGNOSIS — L89622 Pressure ulcer of left heel, stage 2: Secondary | ICD-10-CM | POA: Diagnosis present

## 2017-02-04 LAB — CBC
HEMATOCRIT: 31.1 % — AB (ref 36.0–46.0)
HEMOGLOBIN: 9.9 g/dL — AB (ref 12.0–15.0)
MCH: 27 pg (ref 26.0–34.0)
MCHC: 31.8 g/dL (ref 30.0–36.0)
MCV: 84.7 fL (ref 78.0–100.0)
Platelets: 444 10*3/uL — ABNORMAL HIGH (ref 150–400)
RBC: 3.67 MIL/uL — AB (ref 3.87–5.11)
RDW: 18.8 % — ABNORMAL HIGH (ref 11.5–15.5)
WBC: 16.3 10*3/uL — AB (ref 4.0–10.5)

## 2017-02-04 LAB — BASIC METABOLIC PANEL
ANION GAP: 13 (ref 5–15)
Anion gap: 14 (ref 5–15)
BUN: 6 mg/dL (ref 6–20)
BUN: 5 mg/dL — ABNORMAL LOW (ref 6–20)
CHLORIDE: 99 mmol/L — AB (ref 101–111)
CHLORIDE: 96 mmol/L — AB (ref 101–111)
CO2: 26 mmol/L (ref 22–32)
CO2: 26 mmol/L (ref 22–32)
Calcium: 8.9 mg/dL (ref 8.9–10.3)
Calcium: 9.1 mg/dL (ref 8.9–10.3)
Creatinine, Ser: 0.76 mg/dL (ref 0.44–1.00)
Creatinine, Ser: 0.84 mg/dL (ref 0.44–1.00)
GFR calc non Af Amer: >60 mL/min (ref >60)
GFR calc non Af Amer: >60 mL/min (ref >60)
Glucose, Bld: 138 mg/dL — ABNORMAL HIGH (ref 65–99)
Glucose, Bld: 127 mg/dL — ABNORMAL HIGH (ref 65–99)
POTASSIUM: 2.9 mmol/L — AB (ref 3.5–5.1)
POTASSIUM: 4 mmol/L (ref 3.5–5.1)
Sodium: 138 mmol/L (ref 135–145)
Sodium: 136 mmol/L (ref 135–145)

## 2017-02-04 LAB — MAGNESIUM
MAGNESIUM: 1.2 mg/dL — AB (ref 1.7–2.4)
Magnesium: 2.2 mg/dL (ref 1.7–2.4)

## 2017-02-04 LAB — GLUCOSE, CAPILLARY
GLUCOSE-CAPILLARY: 136 mg/dL — AB (ref 65–99)
Glucose-Capillary: 153 mg/dL — ABNORMAL HIGH (ref 65–99)
Glucose-Capillary: 127 mg/dL — ABNORMAL HIGH (ref 65–99)
Glucose-Capillary: 125 mg/dL — ABNORMAL HIGH (ref 65–99)

## 2017-02-04 MED ORDER — METOPROLOL SUCCINATE ER 50 MG PO TB24
50.0000 mg | ORAL_TABLET | Freq: Every day | ORAL | Status: DC
Start: 2017-02-04 — End: 2017-02-05
  Administered 2017-02-04 – 2017-02-05 (×2): 50 mg via ORAL
  Filled 2017-02-04 (×2): qty 1

## 2017-02-04 MED ORDER — MAGNESIUM SULFATE 2 GM/50ML IV SOLN: 2.0000 g | Freq: Once | INTRAVENOUS | Status: DC

## 2017-02-04 MED ORDER — POTASSIUM CHLORIDE CRYS ER 20 MEQ PO TBCR
40.0000 meq | EXTENDED_RELEASE_TABLET | Freq: Three times a day (TID) | ORAL | Status: AC
  Administered 2017-02-04 – 2017-02-05 (×3): 40 meq via ORAL
  Filled 2017-02-04 (×3): qty 2

## 2017-02-04 MED ORDER — HYDRALAZINE HCL 20 MG/ML IJ SOLN: 5.0000 mg | Freq: Once | INTRAMUSCULAR | Status: DC

## 2017-02-04 MED ORDER — CEFTRIAXONE SODIUM 1 G IJ SOLR
1.0000 g | INTRAMUSCULAR | Status: DC
  Administered 2017-02-04: 1 g via INTRAVENOUS
  Filled 2017-02-04: qty 10

## 2017-02-04 MED ORDER — HYDRALAZINE HCL 20 MG/ML IJ SOLN
5.0000 mg | INTRAMUSCULAR | Status: DC | PRN
  Administered 2017-02-04: 5 mg via INTRAVENOUS
  Filled 2017-02-04: qty 1

## 2017-02-04 MED ORDER — POTASSIUM CHLORIDE CRYS ER 20 MEQ PO TBCR: 40.0000 meq | EXTENDED_RELEASE_TABLET | Freq: Three times a day (TID) | ORAL | Status: DC

## 2017-02-04 MED ORDER — POTASSIUM CHLORIDE CRYS ER 20 MEQ PO TBCR
40.0000 meq | EXTENDED_RELEASE_TABLET | Freq: Two times a day (BID) | ORAL | Status: DC
  Administered 2017-02-04: 40 meq via ORAL
  Filled 2017-02-04: qty 2

## 2017-02-04 MED ORDER — LOSARTAN POTASSIUM 50 MG PO TABS
100.0000 mg | ORAL_TABLET | Freq: Every day | ORAL | Status: DC
  Administered 2017-02-04 – 2017-02-05 (×2): 100 mg via ORAL
  Filled 2017-02-04 (×2): qty 2

## 2017-02-04 MED ORDER — METOPROLOL SUCCINATE ER 50 MG PO TB24: 50.0000 mg | ORAL_TABLET | Freq: Every day | ORAL | Status: DC

## 2017-02-04 MED ORDER — AZITHROMYCIN 250 MG PO TABS
500.0000 mg | ORAL_TABLET | Freq: Once | ORAL | Status: AC
  Administered 2017-02-04: 500 mg via ORAL
  Filled 2017-02-04: qty 2

## 2017-02-04 MED ORDER — MAGNESIUM OXIDE 400 (241.3 MG) MG PO TABS
400.0000 mg | ORAL_TABLET | Freq: Once | ORAL | Status: AC
  Administered 2017-02-04: 400 mg via ORAL
  Filled 2017-02-04: qty 1

## 2017-02-04 MED ORDER — LOSARTAN POTASSIUM 50 MG PO TABS: 100.0000 mg | ORAL_TABLET | Freq: Every day | ORAL | Status: DC

## 2017-02-04 MED ORDER — MAGNESIUM SULFATE 2 GM/50ML IV SOLN
2.0000 g | Freq: Every day | INTRAVENOUS | Status: DC
  Administered 2017-02-04 – 2017-02-05 (×2): 2 g via INTRAVENOUS
  Filled 2017-02-04 (×2): qty 50

## 2017-02-04 MED ORDER — AZITHROMYCIN 250 MG PO TABS: 250.0000 mg | ORAL_TABLET | Freq: Every day | ORAL | Status: DC

## 2017-02-04 MED ORDER — LEVOFLOXACIN 500 MG PO TABS
500.0000 mg | ORAL_TABLET | Freq: Every day | ORAL | Status: DC
  Administered 2017-02-05: 500 mg via ORAL
  Filled 2017-02-04: qty 1

## 2017-02-04 MED ORDER — GADOBENATE DIMEGLUMINE 529 MG/ML IV SOLN
15.0000 mL | Freq: Once | INTRAVENOUS | Status: AC | PRN
  Administered 2017-02-04: 15 mL via INTRAVENOUS

## 2017-02-04 NOTE — Progress Notes (Signed)
Family Medicine Teaching Service Daily Progress Note Intern Pager: [phone]  Patient name: Lauren [name] Medical record number: [id number] Date of birth: May 06, 1951 Age: 66 y.o. Gender: female  Primary Care Provider: Marylene Land, MD Consultants: None Code Status: Full  Pt Overview and Major Events to Date:  02/02/2017 - Admit for AMS, sepsis  Assessment and Plan: Lauren [name] is a 66 y.o. female presenting with AMS and sepsis. PMH is significant for COPD, CAD, Hyperlipidemia, HTN, T2DM, spinal cord tumor, DJD, depression, tobacco abuse, anemia  Sepsis 2/2 to CAP- SIRS met on admission with leukocytosis, fever, tachycardia. CXR with RLL and RML pneumonia. Satting well on RA this AM. -vitals per floor -up with assistance - De-escalate from vanc and zosyn (started 2/5), today >> Levaquin  - blood cultures CONS, likely contaminate, otherwise NGTD - pain management as below - zofran as needed for nausea  AMS, improved -  Suspect this is secondary to sepsis versus sequelae of high doses of pain medications. Patient oriented to person and place, year (not month), cannot name president. - treat pneumonia as above - monitor for improvement - head CT no bleed  Electrolyte abnormalities: K low at 2.9, mag low at 1.2 - Replace with K-dur 68mq x3 - replete mag (267mIV mag daily) - repeat PM BMP - hold chlorthalidone  HTN, active - BP low/normal on admission. Now hypertensive, high as 19951ystolic this AM. Gave 5 mg hydralazine and added PRN hydralazine. Takes 25 mg chlorthalidone, losartan 10072mand toprol-xl 19m33mily - initially home meds held due to sepsis - restarted losartan and metoprolol this morning - continue to hold chlorthalidone given hypokalemia and hypomagnesemia, can restart when electrolytes are stable  Chronic back pain- patient managed by pain clinic who has recently been trying to titrate down pain medications. 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 S/p L4-L5 and L5-S1 spinal fusion, laminectomy, and decompression of the thecal sac in 06/2013. Osteomyelitis in 07/2013. Removal of the hardware in 12/2013, irrigation and debridement, and revision of the lumbar laminectomy.  - Currently using 170 morphine equivalents daily. Pain clinic trying to wean her. -continue home regimen- oxycontin 30mg36mhrs, Tapentadol 19mg 86ms, Gabapentin 600mg s68mimes daily, metaxalone 800mg ti32meld Nortriptyline 20mg qhs52matient noted to have 4/5 LE strength with slightly decreased tone and c/o new fecal incontinence over past couple of weeks -- ordered MRI to r/o cauda equina syndrome/spinal osteo given history - patient refused lumbar MRI due to pain - states will go to have MRI done after she gets her pain meds today  COPD- patient uses albuterol inhaler as needed as home, also uses advair daily. Mild diffuse wheezing on exam with good air movement. Low suspicion of COPD exacerbation given current presentation. -duonebs q6 hours scheduled and albuterol q2hrs prn -dulera BID -consider adding steroids if patient does not improve with treatment of pneumonia -oxygen as above  T2DM, hypoglycemia due to no PO first night of admission, now improved. Not taking PO at all other than clear liquids. - takes 35 units lantus/daily, metformin 1000 BID -hold home metformin - hold home lantus 15 units every night - held for now until tol PO given low CBG to 51 on first night of admission, blood sugars now 122-153. Continue to hold Lantus -sensitive sliding scale  CAD- daily aspirin and SL nitroglycerin as needed -continue aspirin  HLD- on crestor 5mg -cont36me home statin  Depression- taking  lexapro 16m daily, nortriptyline 20 mg qhs -continue home psych meds  Tobacco abuse- currently on Chantix daily. Smokes about 1 cigarette a day. -continue chantix  Anemia- takes daily iron, hemoglobin  stable at 9.9, baseline appears to be 10-12 per past chart review.  -continue iron -monitor CBC -transfusion threshold < 8  FEN/GI: heart healthy carb modified diet, NS @ 75/hr Prophylaxis: Lovenox  Disposition: Admitting to FPTS  Subjective:  Patient comfortable this morning, conversant and pleasant. No acute events overnight. BP elevated this AM, paged by nursing and put in PRN orders. Patient does endorse headache, no vision changes. BP improved with hydralazine. Otherwise patient agreeable to MRI lumbar spine today.  Objective: Temp:  [98 F (36.7 C)-99.1 F (37.3 C)] 98 F (36.7 C) (02/07 1156) Pulse Rate:  [92-108] 92 (02/07 1200) Resp:  [14-30] 17 (02/07 1200) BP: (149-194)/(60-121) 158/83 (02/07 1200) SpO2:  [93 %-100 %] 94 % (02/07 1342) Weight:  [163 lb 3.2 oz (74 kg)] 163 lb 3.2 oz (74 kg) (02/07 0437) Physical Exam:  General: NAD, rests comfortably Cardiovascular: RRR, no m/r/g Respiratory: diffuse wheezes, no rhonchi or rales, good air movement  Abdomen: soft and nonteder Extremities: no LE edema Neuro: 4/5 LE weakness, UE tremulous 4/5 strength in upper arms, 5/5 in hands, CN II-XII grossly intact  Laboratory:  Recent Labs Lab 02/02/17 1540 02/03/17 0225 02/04/17 0530  WBC 20.2* 18.4* 16.3*  HGB 9.7* 9.0* 9.9*  HCT 29.4* 28.1* 31.1*  PLT 430* 403* 444*    Recent Labs Lab 02/02/17 1540 02/03/17 0225 02/04/17 0530  NA 133* 137 138  K 3.3* 3.0* 2.9*  CL 95* 103 99*  CO2 '24 22 26  ' BUN 23* 15 6  CREATININE 1.07* 0.81 0.76  CALCIUM 9.1 8.5* 8.9  PROT 6.3*  --   --   BILITOT 0.7  --   --   ALKPHOS 68  --   --   ALT 19  --   --   AST 23  --   --   GLUCOSE 82 58* 138*    Imaging/Diagnostic Tests: Dg Chest 2 View 02/02/2017 FINDINGS: Cardiac silhouette normal in size, unchanged. Thoracic aorta atherosclerotic, unchanged. Hilar and mediastinal contours otherwise unremarkable. Airspace consolidation involving the right middle lobe and right lower  lobe. Lungs otherwise clear. Small right pleural effusion. No left pleural effusion. Pulmonary vascularity normal. Visualized bony thorax intact. IMPRESSION:  1. Acute pneumonia involving the right middle lobe and right lower lobe.  2. Thoracic aortic atherosclerosis.  Ct Head Wo Contrast: 02/02/2017  FINDINGS: Brain: There is no evidence of acute cortical infarct, intracranial hemorrhage, mass, midline shift, or extra-axial fluid collection. Mild generalized cerebral atrophy is within normal limits for age. Deep cerebral white matter hypodensities have mildly progressed and are nonspecific but compatible with mild-to-moderate chronic small vessel ischemic disease. Vascular: Calcified atherosclerosis at the skullbase. No hyperdense vessel. Skull: No fracture or focal osseous lesion. Sinuses/Orbits: Mild bilateral maxillary sinus mucosal thickening, incompletely imaged. Clear mastoid air cells. Prior bilateral cataract extraction. Other: None.  IMPRESSION:  1. No evidence of acute intracranial abnormality.  2. Mild-to-moderate chronic small vessel ischemic disease, progressed from 2012.   LEverrett Coombe MD 02/04/2017, 2:15 PM PGY-1, CSan SabaIntern pager: [phone] text pages welcome

## 2017-02-04 NOTE — Progress Notes (Signed)
Paged MD this morning with BP 194/99. Orders placed for a PRN medication. I will continue to monitor the client closely.   Saddie Benders RN

## 2017-02-04 NOTE — Evaluation (Signed)
Physical Therapy Evaluation Patient Details Name: Lauren [name] MRN: [id number] DOB: Dec 21, 1951 Today's Date: 02/04/2017   History of Present Illness  pt presents with CAP and Sepsis.  pt with hx of COPD, CAD, HTN, DM, Spinal Cord Tumor, Depression, Chronic Back Pain, and multiple back surgeries.    Clinical Impression  Pt needs encouragement for OOB indicating she feels weak and that her LEs won't move when she tries to make them move.  Pt able to move her own LEs towards EOB and was able to come to standing.  Once in standing pt seems to freeze after taking one side step towards R side and states that her LE won't move.  Utilized the [place] for transfer OOB to recliner with [place].  Spoke with pt and husband about the need for further therapies to increase strength and decrease burden of care.  Will continue to follow.      Follow Up Recommendations SNF    Equipment Recommendations  None recommended by PT    Recommendations for Other Services       Precautions / Restrictions Precautions Precautions: Fall Restrictions Weight Bearing Restrictions: No      Mobility  Bed Mobility Overal bed mobility: Needs Assistance Bed Mobility: Supine to Sit     Supine to sit: Mod assist;HOB elevated     General bed mobility comments: A with trunk and scooting hips towards EOB.    Transfers Overall transfer level: Needs assistance Equipment used: 1 person hand held assist Transfers: Sit to/from Stand Sit to Stand: Mod assist;Min assist         General transfer comment: Initially came to standing with ModA and pt fearful and indicating her LEs won't move, however LEs were moving when coming OOB and while sitting EOB.  Came to stand again with use of stedy and pt was MinA and indicated feeling more secure.    Ambulation/Gait                Stairs            Wheelchair Mobility    Modified Rankin (Stroke Patients Only)       Balance Overall balance assessment:  Needs assistance Sitting-balance support: No upper extremity supported;Feet supported Sitting balance-Leahy Scale: Good     Standing balance support: Bilateral upper extremity supported;During functional activity Standing balance-Leahy Scale: Poor                               Pertinent Vitals/Pain Pain Assessment: Faces Faces Pain Scale: Hurts little more Pain Location: L shoulder Pain Descriptors / Indicators: Sore Pain Intervention(s): Monitored during session;Premedicated before session;Repositioned    Home Living Family/patient expects to be discharged to:: Skilled nursing facility Living Arrangements: Spouse/significant other                    Prior Function Level of Independence: Needs assistance   Gait / Transfers Assistance Needed: pt sleeps in her recliner and uses a RW to ambulate short distances.  Uses transport chair for longer distances.    ADL's / Homemaking Assistance Needed: pt has walk-in bath tub and can bath herself in there.  Difficulties with reaching her feet for dressing.  Husband performs homemaking tasks.          Hand Dominance        Extremity/Trunk Assessment   Upper Extremity Assessment Upper Extremity Assessment: Defer to OT evaluation    Lower  Extremity Assessment Lower Extremity Assessment: Generalized weakness;RLE deficits/detail;LLE deficits/detail RLE Deficits / Details: Strength grossly 3+/5.  Sensation intact, but pt indicates numbness.   RLE Coordination: decreased fine motor;decreased gross motor LLE Deficits / Details: Strength grossly 3+/5.  Sensation intact, but pt indicates numbness.   LLE Coordination: decreased fine motor;decreased gross motor    Cervical / Trunk Assessment Cervical / Trunk Assessment: Kyphotic;Other exceptions Cervical / Trunk Exceptions: Hx of multiple back surgeries.  Communication   Communication: No difficulties  Cognition Arousal/Alertness: Awake/alert Behavior During  Therapy: WFL for tasks assessed/performed Overall Cognitive Status: Within Functional Limits for tasks assessed                      General Comments      Exercises     Assessment/Plan    PT Assessment Patient needs continued PT services  PT Problem List Decreased strength;Decreased activity tolerance;Decreased balance;Decreased mobility;Decreased coordination;Decreased knowledge of use of DME;Decreased safety awareness;Obesity;Pain          PT Treatment Interventions DME instruction;Gait training;Stair training;Functional mobility training;Therapeutic activities;Therapeutic exercise;Balance training;Patient/family education    PT Goals (Current goals can be found in the Care Plan section)  Acute Rehab PT Goals Patient Stated Goal: Get stronger PT Goal Formulation: With patient/family Time For Goal Achievement: 02/18/17 Potential to Achieve Goals: Good    Frequency Min 3X/week   Barriers to discharge        Co-evaluation               End of Session Equipment Utilized During Treatment: Gait belt Activity Tolerance: Patient limited by fatigue Patient left: in chair;with call bell/phone within reach;with family/visitor present Nurse Communication: Mobility status;Need for lift equipment         Time: 1342-1429 PT Time Calculation (min) (ACUTE ONLY): 47 min   Charges:   PT Evaluation $PT Eval Moderate Complexity: 1 Procedure PT Treatments $Therapeutic Activity: 23-37 mins   PT G CodesCatarina [name], PT  [phone] 02/04/2017, 2:48 PM

## 2017-02-04 NOTE — Progress Notes (Signed)
PT Cancellation Note  Patient Details Name: Lauren [name] MRN: [id number] DOB: 1951-11-09   Cancelled Treatment:    Reason Eval/Treat Not Completed: Patient not medically ready.  Pt's BP 194/99 while at rest in bed.  RN made aware.  Will follow up another time.     Thornton Papas Maciah Feeback 02/04/2017, 9:29 AM

## 2017-02-04 NOTE — Progress Notes (Signed)
Initial Nutrition Assessment  DOCUMENTATION CODES:   Not applicable  INTERVENTION:    Mighty Shake II TID with meals, each supplement provides 480-500 kcals and 20-23 grams of protein  NUTRITION DIAGNOSIS:   Inadequate oral intake related to poor appetite as evidenced by per patient/family report, percent weight loss (2.4% weight loss in one week).  GOAL:   Patient will meet greater than or equal to 90% of their needs  MONITOR:   PO intake, Supplement acceptance, Labs, Weight trends  REASON FOR ASSESSMENT:   Malnutrition Screening Tool    ASSESSMENT:   66 y.o. female presenting with AMS and sepsis. PMH is significant for COPD, CAD, Hyperlipidemia, HTN, T2DM, spinal cord tumor, DJD, depression, tobacco abuse, anemia.  Patient reports recent poor intake due to pneumonia with weight loss of ~4-5 lbs over the past week. 2.4% weight loss within one week is significant.  She does not like the hospital food, c/o that the food tastes like the hospital smells. She does not plan to eat anything sent on her tray. Patient declines Ensure and Boost supplements. She agreed to try a [employer] II with meals. Nutrition-Focused physical exam completed. Findings are no fat depletion, no muscle depletion, and no edema.  Labs reviewed: potassium 2.9, magnesium 1.2 Medications reviewed and include ferrous sulfate, magnesium sulfate, and KCl.  Diet Order:  Diet Carb Modified Fluid consistency: Thin; Room service appropriate? Yes  Skin:  Wound (see comment) (stage II to buttocks)  Last BM:  2/7  Height:   Ht Readings from Last 1 Encounters:  02/02/17 5\' 5"  (1.651 m)    Weight:   Wt Readings from Last 1 Encounters:  02/04/17 163 lb 3.2 oz (74 kg)    Ideal Body Weight:  56.8 kg  BMI:  Body mass index is 27.16 kg/m.  Estimated Nutritional Needs:   Kcal:  1650-1850  Protein:  90-100 gm  Fluid:  1.8 L  EDUCATION NEEDS:   No education needs identified at this  time  Molli Barrows, [place], [place], [place] Pager [phone] After Hours Pager [phone]

## 2017-02-05 LAB — BASIC METABOLIC PANEL
ANION GAP: 11 (ref 5–15)
BUN: 6 mg/dL (ref 6–20)
CALCIUM: 9 mg/dL (ref 8.9–10.3)
CO2: 28 mmol/L (ref 22–32)
CREATININE: 0.85 mg/dL (ref 0.44–1.00)
Chloride: 98 mmol/L — ABNORMAL LOW (ref 101–111)
Glucose, Bld: 105 mg/dL — ABNORMAL HIGH (ref 65–99)
Potassium: 4.1 mmol/L (ref 3.5–5.1)
Sodium: 137 mmol/L (ref 135–145)

## 2017-02-05 LAB — CULTURE, BLOOD (ROUTINE X 2)

## 2017-02-05 LAB — CBC
HCT: 30.6 % — ABNORMAL LOW (ref 36.0–46.0)
HEMOGLOBIN: 9.7 g/dL — AB (ref 12.0–15.0)
MCH: 27.1 pg (ref 26.0–34.0)
MCHC: 31.7 g/dL (ref 30.0–36.0)
MCV: 85.5 fL (ref 78.0–100.0)
PLATELETS: 464 10*3/uL — AB (ref 150–400)
RBC: 3.58 MIL/uL — AB (ref 3.87–5.11)
RDW: 18.9 % — ABNORMAL HIGH (ref 11.5–15.5)
WBC: 13.6 10*3/uL — ABNORMAL HIGH (ref 4.0–10.5)

## 2017-02-05 LAB — GLUCOSE, CAPILLARY
GLUCOSE-CAPILLARY: 109 mg/dL — AB (ref 65–99)
Glucose-Capillary: 115 mg/dL — ABNORMAL HIGH (ref 65–99)

## 2017-02-05 LAB — MAGNESIUM: MAGNESIUM: 1.7 mg/dL (ref 1.7–2.4)

## 2017-02-05 MED ORDER — LEVOFLOXACIN 500 MG PO TABS: 500.0000 mg | ORAL_TABLET | Freq: Every day | ORAL | 0 refills | Status: AC

## 2017-02-05 NOTE — Discharge Instructions (Signed)
Please follow up with your primary care doctor in 2-3 days after being discharged. You can use Mucinex for congestion.  We stopped your Lantus on discharge due to low blood sugars, plus you have not been feeling like eating very much. Please continue to take Metformin and follow up with your PCP to discuss insulin needs.    Community-Acquired Pneumonia, Adult Introduction Pneumonia is an infection of the lungs. One type of pneumonia can happen while a person is in a hospital. A different type can happen when a person is not in a hospital (community-acquired pneumonia). It is easy for this kind to spread from person to person. It can spread to you if you breathe near an infected person who coughs or sneezes. Some symptoms include:  A dry cough.  A wet (productive) cough.  Fever.  Sweating.  Chest pain. Follow these instructions at home:  Take over-the-counter and prescription medicines only as told by your doctor.  Only take cough medicine if you are losing sleep.  If you were prescribed an antibiotic medicine, take it as told by your doctor. Do not stop taking the antibiotic even if you start to feel better.  Sleep with your head and neck raised (elevated). You can do this by putting a few pillows under your head, or you can sleep in a recliner.  Do not use tobacco products. These include cigarettes, chewing tobacco, and e-cigarettes. If you need help quitting, ask your doctor.  Drink enough water to keep your pee (urine) clear or pale yellow. A shot (vaccine) can help prevent pneumonia. Shots are often suggested for:  People older than 67 years of age.  People older than 67 years of age:  Who are having cancer treatment.  Who have long-term (chronic) lung disease.  Who have problems with their body's defense system (immune system). You may also prevent pneumonia if you take these actions:  Get the flu (influenza) shot every year.  Go to the dentist as often as  told.  Wash your hands often. If soap and water are not available, use hand sanitizer. Contact a doctor if:  You have a fever.  You lose sleep because your cough medicine does not help. Get help right away if:  You are short of breath and it gets worse.  You have more chest pain.  Your sickness gets worse. This is very serious if:  You are an older adult.  Your body's defense system is weak.  You cough up blood. This information is not intended to replace advice given to you by your health care provider. Make sure you discuss any questions you have with your health care provider. Document Released: 06/02/2008 Document Revised: 05/22/2016 Document Reviewed: 04/11/2015  2017 Elsevier

## 2017-02-05 NOTE — Clinical Social Work Note (Signed)
CSW met with patient and husband at bedside to discuss SNF placement through private pay. CSW explained to the patient and husband that the patient would only have a 2 night inpatient stay which unfortunately doesn't qualify the patient to utilize Medicare SNF benefits. CSW did discuss SNF placement through private pay which the patient and husband are able to afford. The patient and husband wanted to see how well the patient could walk with NT DJ before making a decision. CSW followed up with the patient and husband following walking with NT and the patient and husband state that they feel home with HHPT will be manageable. CSW signing off at this time.   Bryant  MSW, LCSW, LCASA, [phone] 

## 2017-02-05 NOTE — Care Management Note (Addendum)
Case Management Note  Patient Details  Name: Lauren [name] MRN: [id number] Date of Birth: [birth year]  Subjective/Objective:  Pt presented for Sepsis. Pt is from home with husband. Pt has DME: RW, [facility] and [facility]. Recommendations from PT for SNF- Pt does not have a 3 day qualifying inpatient stay for SNF. Per MD and MD notes pt is stable for d/c 02-05-17 for home.                 Action/Plan: CM did discuss disposition needs with patient. Patient wants to ambulate before she is d/c home today. CM did speak with Staff RN to see if staff could ambulate. CM did make pt aware that she does not have a 3 day qualifying inpatient stay in order for Medicare to pay for SNF. CM did make pt and husband aware that SNF can be paid for out of pocket and would range between $500-$600 day. Family not willing to pay that amount. Pt is agreeable to [facility]. Husband is retired and will be home. CM did offer agency choice and pt chose [facility] for services PT/ Aide. Referral made to [facility] with [facility] and SOC to begin within 24-48 hours post d/c. Pt did not think RN would be beneficial.  No further needs from CM at this time.   Expected Discharge Date:                  Expected Discharge Plan:  [place]  In-House Referral:  NA  Discharge planning Services  CM Consult  Post Acute Care Choice:  Home Health Choice offered to:  Patient, Sibling  DME Arranged:  N/A DME Agency:  NA  HH Arranged:  PT, Aide [place] Agency:  [place]  Status of Service:  Completed, signed off  If discussed at [place] of Stay Meetings, dates discussed:    Additional Comments:  Bethena Roys, RN 02/05/2017, 1:37 PM

## 2017-02-05 NOTE — Progress Notes (Signed)
Family Medicine Teaching Service Daily Progress Note Intern Pager: [phone]  Patient name: Lauren [name] Medical record number: [id number] Date of birth: 1951/02/25 Age: 66 y.o. Gender: female  Primary Care Provider: Marylene Land, MD Consultants: None Code Status: Full  Pt Overview and Major Events to Date:  02/02/2017 - Admit for AMS, sepsis  Assessment and Plan: Lauren [name] is a 66 y.o. female presenting with AMS and sepsis. PMH is significant for COPD, CAD, Hyperlipidemia, HTN, T2DM, spinal cord tumor, DJD, depression, tobacco abuse, anemia  Sepsis 2/2 to CAP- SIRS met on admission with leukocytosis, fever, tachycardia. CXR with RLL and RML pneumonia. Satting well on RA this AM. -vitals per floor -up with assistance - Continue Zosyn, plan to complete 5 days total of antibiotics for CAP - blood cultures CONS, likely contaminate, otherwise NGTD - pain management as below - zofran as needed for nausea  AMS, resolved-  Suspect this is secondary to sepsis versus sequelae of high doses of pain medications - treat pneumonia as above - monitor for improvement -continue tapering down pain medicine as outpatient  Electrolyte abnormalities- improved: K low at 2.9 > 4.1, mag low at 1.2 - replete mag (2m IV mag daily) - hold chlorthalidone - monitor BMP, mag  HTN, active - BP this morning 166/97. PRN hydralazine. Takes 25 mg chlorthalidone, losartan 1027m and toprol-xl 5065maily - initially home meds held due to sepsis - restarted losartan and metoprolol this morning - restart chlorthalidone  Chronic back pain- patient managed by pain clinic who has recently been trying to titrate down pain medications. 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 S/p L4-L5 and L5-S1 spinal fusion, laminectomy, and decompression of the thecal sac in 06/2013. Osteomyelitis in 07/2013. Removal of the hardware in 12/2013,  irrigation and debridement, and revision of the lumbar laminectomy. MRI thoracic and lumbar spine without signs of infection or recurrence of mets - Currently using 170 morphine equivalents daily. Pain clinic trying to wean her. -continue home regimen- oxycontin 8m85m2hrs, Tapentadol 50mg59mrs, Gabapentin 600mg 81mtimes daily, metaxalone 800mg t67mheld Nortriptyline 20mg qh43mOPD- stable- patient uses albuterol inhaler as needed as home, also uses advair daily. Mild diffuse wheezing on exam with good air movement. Low suspicion of COPD exacerbation given current presentation. -duonebs q6 hours scheduled and albuterol q2hrs prn -dulera BID -consider adding steroids if patient does not improve with treatment of pneumonia -oxygen as above  T2DM, hypoglycemia due to no PO first night of admission, now improved. Little PO intake but this is patients usual, has decreased appetite at baseline. CBG 109 this morning - takes 35 units lantus/daily, metformin 1000 BID -hold home metformin - hold lantus, patient may need to hold lantus after discharge -sensitive sliding scale  CAD- daily aspirin and SL nitroglycerin as needed -continue aspirin  HLD- on crestor 5mg -con15mue home statin  Depression- taking lexapro 20mg dail21mortriptyline 20 mg qhs -continue home psych meds  Tobacco abuse- currently on Chantix daily. Smokes about 1 cigarette a day. -continue chantix  Anemia- takes daily iron, hemoglobin stable at 9.9, baseline appears to be 10-12 per past chart review.  -continue iron -monitor CBC -transfusion threshold < 8  FEN/GI: heart healthy carb modified diet Prophylaxis: Lovenox  Disposition: home today 2/8  Subjective:  [name], she is hoping to go home. No fevers or chills. Still has a cough. No SOB. No confusion.  Objective: Temp:  [98 F (36.7 C)-99 F (37.2 C)] 98.3 F (36.8 C) (02/08 0719) Pulse Rate:  [86-94] 86 (02/08  0719) Resp:  [16-20] 20 (02/08 0719) BP: (128-171)/(72-97) 166/97 (02/08 0719) SpO2:  [93 %-97 %] 97 % (02/08 0719) Weight:  [167 lb 4.8 oz (75.9 kg)] 167 lb 4.8 oz (75.9 kg) (02/08 0551) Physical Exam:  General: pleasant lady laying in bed in NAD Cardiovascular: RRR, no m/r/g Respiratory: normal work of breathing, diffuse wheezes, no rhonchi or rales, good air movement  Abdomen: soft and nontender +BS Extremities: no LE edema, no cyanosis. Neuro: no focal deficits  Laboratory:  Recent Labs Lab 02/03/17 0225 02/04/17 0530 02/05/17 0351  WBC 18.4* 16.3* 13.6*  HGB 9.0* 9.9* 9.7*  HCT 28.1* 31.1* 30.6*  PLT 403* 444* 464*    Recent Labs Lab 02/02/17 1540  02/04/17 0530 02/04/17 1542 02/05/17 0351  NA 133*  < > 138 136 137  K 3.3*  < > 2.9* 4.0 4.1  CL 95*  < > 99* 96* 98*  CO2 24  < > '26 26 28  ' BUN 23*  < > 6 5* 6  CREATININE 1.07*  < > 0.76 0.84 0.85  CALCIUM 9.1  < > 8.9 9.1 9.0  PROT 6.3*  --   --   --   --   BILITOT 0.7  --   --   --   --   ALKPHOS 68  --   --   --   --   ALT 19  --   --   --   --   AST 23  --   --   --   --   GLUCOSE 82  < > 138* 127* 105*  < > = values in this interval not displayed.  Imaging/Diagnostic Tests: Dg Chest 2 View 02/02/2017 FINDINGS: Cardiac silhouette normal in size, unchanged. Thoracic aorta atherosclerotic, unchanged. Hilar and mediastinal contours otherwise unremarkable. Airspace consolidation involving the right middle lobe and right lower lobe. Lungs otherwise clear. Small right pleural effusion. No left pleural effusion. Pulmonary vascularity normal. Visualized bony thorax intact. IMPRESSION:  1. Acute pneumonia involving the right middle lobe and right lower lobe.  2. Thoracic aortic atherosclerosis.  Ct Head Wo Contrast: 02/02/2017  FINDINGS: Brain: There is no evidence of acute cortical infarct, intracranial hemorrhage, mass, midline shift, or extra-axial fluid collection. Mild generalized cerebral atrophy is within  normal limits for age. Deep cerebral white matter hypodensities have mildly progressed and are nonspecific but compatible with mild-to-moderate chronic small vessel ischemic disease. Vascular: Calcified atherosclerosis at the skullbase. No hyperdense vessel. Skull: No fracture or focal osseous lesion. Sinuses/Orbits: Mild bilateral maxillary sinus mucosal thickening, incompletely imaged. Clear mastoid air cells. Prior bilateral cataract extraction. Other: None.  IMPRESSION:  1. No evidence of acute intracranial abnormality.  2. Mild-to-moderate chronic small vessel ischemic disease, progressed from 2012.   [name], DO 02/05/2017, 9:34 AM PGY-1, [place] Intern pager: [phone], text pages welcome

## 2017-02-05 NOTE — Discharge Summary (Signed)
[facility] Discharge Summary  Patient name: Lauren [name] Medical record number: [id number] Date of birth: 1951-08-31 Age: 65 y.o. Gender: female Date of Admission: 02/02/2017  Date of Discharge: 02/06/17  Admitting Physician: Lupita Dawn, MD  Primary Care Provider: Marylene Land, MD Consultants: PT/OT  Indication for Hospitalization: sepsis secondary to CAP  Discharge Diagnoses/Problem List:  Patient Active Problem List   Diagnosis Date Noted  . Pressure ulcer of left heel, stage 2 02/04/2017  . Altered mental status   . Community acquired pneumonia   . Encephalopathy   . Fall   . Weakness of both lower extremities   . Other chronic pain   . Narcotic dependence ([place])   . Incontinence of feces   . Sepsis ([place]) 02/02/2017  . Bilateral carotid artery stenosis 01/30/2017  . Aftercare following surgery of the circulatory system 01/30/2017  . Impingement syndrome of left shoulder 12/17/2016  . Coronary artery calcification of native artery 10/07/2016  . Iron (Fe) deficiency anemia 10/07/2016  . DDD (degenerative disc disease), lumbar 10/03/2016  . Fatigue 10/03/2016  . Intervertebral disc disease 10/03/2016  . Low back pain 10/03/2016  . Myelopathy ([place]) 10/03/2016  . Prolapsed lumbosacral intervertebral disc 10/03/2016  . Spondylisthesis 10/03/2016  . MRSA (methicillin resistant staph aureus) culture positive 10/10/2015  . Carotid stenosis 08/17/2015  . Preoperative cardiovascular examination 08/10/2015  . Tobacco abuse 08/10/2015  . 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 Acute osteomyelitis of spine ([place]) 08/28/2014  . Difficult or painful urination 08/28/2014  . H/O Spinal surgery 08/28/2014  . Elevated WBC count 08/28/2014  . Encounter for aftercare for long-term (current) use of antibiotics 08/28/2014  . N&V (nausea and vomiting) 08/28/2014  . Bladder retention 08/28/2014  . Infection of urinary tract 08/28/2014  . DM2 (diabetes  mellitus, type 2) ([place]) 06/15/2013  . Ventral hernia 06/15/2013  . COPD (chronic obstructive pulmonary disease) ([place]) 06/15/2013  . DJD (degenerative joint disease) of lumbar spine 06/15/2013  . Depression 06/15/2013  . Exertional angina ([place]) 06/15/2013  . Preoperative evaluation 06/15/2013  . HTN (hypertension) 06/15/2013  . Occlusion and stenosis of carotid artery without mention of cerebral infarction 03/02/2012  . Malignant neoplasm of corpus uteri, except isthmus ([place]) 01/19/2012  . Abnormal EKG 12/07/2011  . Essential hypertension, benign 12/07/2011  . Hyperlipidemia with low HDL 12/07/2011  . Carotid artery disease without cerebral infarction [facility]) 12/07/2011     Disposition: home with [facility] PT  Discharge Condition: stable  Discharge Exam: see progress note from day of discharge  Brief Hospital Course:   CAP Patient is a 66 year old female whop resented with altered mental status and sepsis secondary to community acquired pneumonia. The patient initially was treated for sepsis with fluids and broad spectrum antibiotics (zosyn).  She stabilized and her vitals were stable for the remainder of her hospitalization. Her antibiotics were narrowed to Levaquin to complete a 5 day course.  Electrolytes including potassium and magnesium were monitored and repleted as necessary while she was hospitalized.  Diabetes Patient's blood sugars were noted to be low on the first day of admission in the setting of poor PO intake and sepsis. Lantus was held. Sugars remained controlled throughout hospitalization off of lantus and so this medication was held at discharge. Patient will need close PCP follow up for diabetes management to safely restart her long-acting insulin once her acute illness has resolved  LE weakness Patient noted to have generalized weakness on admission, however there was concern because her  lower extremities seemed weaker bilaterally than upper extremities, and she complained  of some fecal incontinence over previous weeks and was noted to have somewhat diminished rectal tone. MRI lumbar spine was ordered to rule out cauda equina syndrome vs infectious process in the spine given patient's complex history.  The MRI was notably unchanged from prior imaging without evidence of acute abnormality or infectious process.  Issues for Follow Up:  1. Follow up lantus requirements, held in hospital for low CBG, discharged with lantus held. Patient with poor PO intake. Discuss insulin needs at follow up with PCP 2. Recheck electrolytes specifically potassium and magnesium as these were low and needed repletion in hospital 3. Continue to wean down pain medications  Significant Procedures: none  Significant Labs and Imaging:   Recent Labs Lab 02/03/17 0225 02/04/17 0530 02/05/17 0351  WBC 18.4* 16.3* 13.6*  HGB 9.0* 9.9* 9.7*  HCT 28.1* 31.1* 30.6*  PLT 403* 444* 464*    Recent Labs Lab 02/02/17 1540 02/03/17 0225 02/04/17 0530 02/04/17 1542 02/05/17 0351  NA 133* 137 138 136 137  K 3.3* 3.0* 2.9* 4.0 4.1  CL 95* 103 99* 96* 98*  CO2 24 22 26 26 28   GLUCOSE 82 58* 138* 127* 105*  BUN 23* 15 6 5* 6  CREATININE 1.07* 0.81 0.76 0.84 0.85  CALCIUM 9.1 8.5* 8.9 9.1 9.0  MG  --   --  1.2* 2.2 1.7  ALKPHOS 68  --   --   --   --   AST 23  --   --   --   --   ALT 19  --   --   --   --   ALBUMIN 2.7*  --   --   --   --    Dg Chest 2 View  Result Date: 02/02/2017 CLINICAL DATA:  66 year old with multiple recent falls, including a fall at home earlier today. Current smoker. Patient currently awaiting back surgery and is being weaned from pain medicines. EXAM: CHEST  2 VIEW COMPARISON:  CT chest 09/04/2016, 02/23/2015 and earlier. FINDINGS: Cardiac silhouette normal in size, unchanged. Thoracic aorta atherosclerotic, unchanged. Hilar and mediastinal contours otherwise unremarkable. Airspace consolidation involving the right middle lobe and right lower lobe. Lungs  otherwise clear. Small right pleural effusion. No left pleural effusion. Pulmonary vascularity normal. Visualized bony thorax intact. IMPRESSION: 1. Acute pneumonia involving the right middle lobe and right lower lobe. 2. Thoracic aortic atherosclerosis. Electronically Signed   By: Evangeline Dakin M.D.   On: 02/02/2017 17:41   Ct Head Wo Contrast  Result Date: 02/02/2017 CLINICAL DATA:  Fall, hitting back of head. Headache. Altered mental status. Initial encounter. EXAM: CT HEAD WITHOUT CONTRAST TECHNIQUE: Contiguous axial images were obtained from the base of the skull through the vertex without intravenous contrast. COMPARISON:  04/16/2011 FINDINGS: Brain: There is no evidence of acute cortical infarct, intracranial hemorrhage, mass, midline shift, or extra-axial fluid collection. Mild generalized cerebral atrophy is within normal limits for age. Deep cerebral white matter hypodensities have mildly progressed and are nonspecific but compatible with mild-to-moderate chronic small vessel ischemic disease. Vascular: Calcified atherosclerosis at the skullbase. No hyperdense vessel. Skull: No fracture or focal osseous lesion. Sinuses/Orbits: Mild bilateral maxillary sinus mucosal thickening, incompletely imaged. Clear mastoid air cells. Prior bilateral cataract extraction. Other: None. IMPRESSION: 1. No evidence of acute intracranial abnormality. 2. Mild-to-moderate chronic small vessel ischemic disease, progressed from 2012. Electronically Signed   By: Logan Bores M.D.   On:  02/02/2017 16:16   Mr Thoracic Spine Wo Contrast  Result Date: 02/03/2017 CLINICAL DATA:  Altered mental status. History of spinal surgery for a mass. Sepsis. EXAM: MRI THORACIC SPINE WITHOUT CONTRAST TECHNIQUE: Multiplanar, multisequence MR imaging of the thoracic spine was performed. No intravenous contrast was administered. COMPARISON:  PA and lateral chest today.  CT chest 09/04/2016. FINDINGS: Alignment: Maintained. There is mild  exaggeration of the normal thoracic kyphosis. Vertebrae: Height and signal are normal. Cord:  Normal signal throughout. Paraspinal and other soft tissues: Right middle and lower lobe airspace disease are identified most consistent with pneumonia. There is also left basilar airspace disease. Disc levels: The central spinal canal and neural foramina are widely patent at all levels. Shallow right paracentral protrusion at T8-9 is noted. IMPRESSION: Please note the patient refused imaging of the lumbar spine and IV contrast. Contrast would not add to the diagnostic utility of this exam. Right middle and lower lobe airspace disease most consistent with pneumonia. Airspace disease in the left lower lobe is also likely due to pneumonia. Small right paracentral protrusion T8-9 without central canal or foraminal stenosis. Electronically Signed   By: Inge Rise M.D.   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 Patient admitted with encephalopathy, sepsis and recurrent falls 02/02/2017. EXAM: MRI LUMBAR SPINE WITHOUT AND WITH CONTRAST TECHNIQUE: Multiplanar and multiecho pulse sequences of the lumbar spine were obtained without and with intravenous contrast. CONTRAST:  15 ml MULTIHANCE GADOBENATE DIMEGLUMINE 529 MG/ML IV SOLN COMPARISON:  MRI lumbar spine 01/23/2014. Limited MRI lumbar spine 08/06/2015. Postmyelogram CT scan 04/15/2016. FINDINGS: Segmentation:  Standard. Alignment: Maintained through L3-4. Approximately 2.1 cm anterolisthesis L4 on L5 is identified. 1.0 cm anterolisthesis L5 on S1. Vertebrae: As noted on prior studies, fusion hardware from L4-S1 has been removed. The L5 vertebral body is fragmented as seen on prior exams. No evidence of acute infectious process or fracture is identified. Conus medullaris: Extends to the L1 level and appears normal. Paraspinal and other soft tissues:  Negative. Disc levels: T10-11 and T11-12 are imaged in the sagittal plane only and negative. T12-L1:  Negative. L1-2:  Negative. L2-3: Shallow disc bulge without central canal or foraminal stenosis. L3-4: Shallow disc bulge without central canal or foraminal stenosis. L4-5: There is severe narrowing of the thecal sac at this level. The patient is status post posterior decompression. Severe bilateral foraminal narrowing is present. The appearance is unchanged compared to the prior CT myelogram. L5-S1: Status post posterior to the the thecal sac appears decompressed. Marked bilateral foraminal narrowing is present. IMPRESSION: No acute abnormality or evidence of infectious process. Postoperative change with resultant central canal or foraminal stenosis L4-5 and L5-S1 as seen on prior CT myelogram comment unchanged. Electronically Signed   By: Inge Rise M.D.   On: 02/04/2017 12:23     Results/Tests Pending at Time of Discharge: none  Discharge Medications:  Allergies as of 02/05/2017      Reactions   Ambien [zolpidem Tartrate] Other (See Comments)   Per the pt, "it makes me go crazy"   Sulfa Antibiotics Rash   Elavil [amitriptyline Hcl] Other (See Comments)   Gastritis   Prozac [fluoxetine Hcl] Anxiety, Other (See Comments)   irritability      Medication List    STOP taking these medications   insulin glargine 100 UNIT/ML injection Commonly known as:  LANTUS  TAKE these medications   ACCU-CHEK AVIVA PLUS test strip Generic drug:  glucose blood 1 each by Other route as needed. Use as instructed   aspirin EC 81 MG tablet Take 81 mg by mouth daily.   betamethasone dipropionate 0.05 % ointment Commonly known as:  DIPROLENE Apply 1 application topically 2 (two) times daily as needed (psoriasis).   chlorthalidone 25 MG tablet Commonly known as:  HYGROTON Take 25 mg by mouth daily.   escitalopram 20 MG tablet Commonly known as:  LEXAPRO Take 20 mg by mouth daily.    esomeprazole 40 MG capsule Commonly known as:  NEXIUM Take 40 mg by mouth daily.   ferrous sulfate 325 (65 FE) MG tablet Take 325 mg by mouth 2 (two) times daily with a meal.   Fluticasone-Salmeterol 250-50 MCG/DOSE Aepb Commonly known as:  ADVAIR Inhale 1 puff into the lungs every 12 (twelve) hours.   folic acid 1 MG tablet Commonly known as:  FOLVITE Take 1 mg by mouth daily.   gabapentin 600 MG tablet Commonly known as:  NEURONTIN Take 600 mg by mouth 6 (six) times daily. 6am, 10am, 4pm, 6pm, 10pm, midnight   levofloxacin 500 MG tablet Commonly known as:  LEVAQUIN Take 1 tablet (500 mg total) by mouth daily.   losartan 100 MG tablet Commonly known as:  COZAAR Take 100 mg by mouth daily.   magnesium oxide 400 MG tablet Commonly known as:  MAG-OX Take 400 mg by mouth daily.   Melatonin 5 MG Tabs Take 10 mg by mouth at bedtime.   metaxalone 800 MG tablet Commonly known as:  SKELAXIN Take 800 mg by mouth 3 (three) times daily. 6am, noon, 6pm   metFORMIN 1000 MG tablet Commonly known as:  GLUCOPHAGE Take 1,000 mg by mouth 2 (two) times daily.   methotrexate 2.5 MG tablet Commonly known as:  RHEUMATREX Take 7.5 mg by mouth See admin instructions. Take 3 tablets (7.5 mg) every Tuesday morning and night (6 tablets total)Caution:Chemotherapy. Protect from light.   metoprolol succinate 50 MG 24 hr tablet Commonly known as:  TOPROL-XL Take 50 mg by mouth daily.   multivitamin with minerals Tabs tablet Take 1 tablet by mouth daily.   nitrofurantoin 100 MG capsule Commonly known as:  MACRODANTIN Take 100 mg by mouth at bedtime.   nitroGLYCERIN 0.4 MG SL tablet Commonly known as:  NITROSTAT Place 1 tablet (0.4 mg total) under the tongue every 5 (five) minutes as needed for chest pain.   nortriptyline 10 MG capsule Commonly known as:  PAMELOR Take 20 mg by mouth at bedtime.   NUCYNTA 50 MG tablet Generic drug:  tapentadol Take 50 mg by mouth every 6 (six)  hours.   ondansetron 4 MG disintegrating tablet Commonly known as:  ZOFRAN-ODT Take 4 mg by mouth every 8 (eight) hours as needed for nausea or vomiting.   oxyCODONE-acetaminophen 10-325 MG tablet Commonly known as:  PERCOCET Take 1 tablet by mouth every 4 (four) hours as needed for pain.   OXYCONTIN 30 MG 12 hr tablet Generic drug:  oxyCODONE Take 30 mg by mouth every 12 (twelve) hours.   potassium chloride 10 MEQ CR tablet Commonly known as:  KLOR-CON Take 10 mEq by mouth daily.   PROAIR RESPICLICK 123XX123 (90 Base) MCG/ACT Aepb Generic drug:  Albuterol Sulfate Inhale 2 puffs into the lungs 4 (four) times daily as needed (congestion, cough, wheezing).   PROBIOTIC PO Take 1 tablet by mouth at bedtime.   rosuvastatin 5 MG  tablet Commonly known as:  CRESTOR Take 5 mg by mouth at bedtime.   varenicline 1 MG tablet Commonly known as:  CHANTIX Take 1 mg by mouth daily.   Vitamin B-12 CR 1500 MCG Tbcr Take 1,500 mcg by mouth daily.   Vitamin D3 1000 units Caps Take 1,000 Units by mouth daily.       Discharge Instructions: Please refer to Patient Instructions section of EMR for full details.  Patient was counseled important signs and symptoms that should prompt return to medical care, changes in medications, dietary instructions, activity restrictions, and follow up appointments.   Follow-Up Appointments: Follow-up Information    [facility] Follow up.   Why:   Physical Therapy, Aide Contact information: [address] [place] [address] [phone]        Marylene Land, MD. Schedule an appointment as soon as possible for a visit.   Specialty:  Family Medicine Contact information: [place] [address] [phone]           Everrett Coombe, MD 02/06/2017, 4:06 AM PGY-1, [place]

## 2017-02-05 NOTE — Evaluation (Signed)
Occupational Therapy Evaluation Patient Details Name: Lauren [name] MRN: [id number] DOB: February 06, 1951 Today's Date: 02/05/2017    History of Present Illness pt presents with CAP and Sepsis.  pt with hx of COPD, CAD, HTN, DM, Spinal Cord Tumor, Depression, Chronic Back Pain, and multiple back surgeries.     Clinical Impression   Pt with decline in function and safety with ADLs and ADL mobility with decreased strength, balance and endurance. Pt would benefit from acute OT services to address impairments to increase level of function and safety    Follow Up Recommendations  Home health OT;Supervision/Assistance - 24 hour    Equipment Recommendations  3 in 1 bedside commode;Tub/shower seat;Other (comment) Management consultant)    Recommendations for Other Services       Precautions / Restrictions Precautions Precautions: Fall Restrictions Weight Bearing Restrictions: No      Mobility Bed Mobility               General bed mobility comments: pt up in recliner  Transfers Overall transfer level: Needs assistance Equipment used: Rolling walker (2 wheeled) Transfers: Sit to/from Stand Sit to Stand: Mod assist         General transfer comment: cues for hand placement    Balance Overall balance assessment: Needs assistance Sitting-balance support: No upper extremity supported;Feet supported Sitting balance-Leahy Scale: Good     Standing balance support: Bilateral upper extremity supported;During functional activity Standing balance-Leahy Scale: Poor                              ADL Overall ADL's : Needs assistance/impaired     Grooming: Wash/dry hands;Wash/dry face;Supervision/safety;Set up;Sitting   Upper Body Bathing: Set up;Supervision/ safety;Sitting   Lower Body Bathing: Moderate assistance   Upper Body Dressing : Supervision/safety;Set up;Sitting   Lower Body Dressing: Maximal assistance;Sit to/from stand   Toilet Transfer: Minimal  assistance;Moderate assistance;RW;Ambulation;Stand-pivot;BSC;Cueing for safety   Toileting- Clothing Manipulation and Hygiene: Moderate assistance       Functional mobility during ADLs: Minimal assistance;Moderate assistance;Cueing for safety       Vision Vision Assessment?: No apparent visual deficits              Pertinent Vitals/Pain Pain Assessment: No/denies pain Pain Intervention(s): Limited activity within patient's tolerance;Monitored during session;Repositioned     Hand Dominance Right   Extremity/Trunk Assessment Upper Extremity Assessment Upper Extremity Assessment: Generalized weakness   Lower Extremity Assessment Lower Extremity Assessment: Defer to PT evaluation       Communication Communication Communication: No difficulties   Cognition Arousal/Alertness: Awake/alert Behavior During Therapy: WFL for tasks assessed/performed Overall Cognitive Status: Within Functional Limits for tasks assessed                     General Comments   pt very pleasant and cooperative                 Home Living Family/patient expects to be discharged to:: Skilled nursing facility Living Arrangements: Spouse/significant other Available Help at Discharge: Family;Available PRN/intermittently Type of Home: House       Home Layout: One level     Bathroom Shower/Tub: Tub/shower unit;Walk-in shower   Bathroom Toilet: Standard     Home Equipment: Walker - 2 wheels;Transport chair          Prior Functioning/Environment Level of Independence: Needs assistance  Gait / Transfers Assistance Needed: pt sleeps in her recliner and uses a RW to ambulate  short distances.  Uses transport chair for longer distances.   ADL's / Homemaking Assistance Needed: pt has walk-in bath tub and can bath herself in there.  Difficulties with reaching her feet for dressing.  Husband performs homemaking tasks.              OT Problem List:     OT  Treatment/Interventions:     OT Goals(Current goals can be found in the care plan section) Acute Rehab OT Goals Patient Stated Goal: Get stronger, go home OT Goal Formulation: With patient/family Time For Goal Achievement: 02/12/17 Potential to Achieve Goals: Good ADL Goals Pt Will Perform Grooming: with min guard assist;standing;with caregiver independent in assisting Pt Will Perform Lower Body Bathing: with min assist;with caregiver independent in assisting;sitting/lateral leans;sit to/from stand Pt Will Perform Lower Body Dressing: with mod assist;with min assist;with caregiver independent in assisting;sitting/lateral leans;sit to/from stand Pt Will Transfer to Toilet: with min assist;with min guard assist;ambulating Pt Will Perform Toileting - Clothing Manipulation and hygiene: with min assist;sitting/lateral leans;sit to/from stand;with caregiver independent in assisting Pt Will Perform Tub/Shower Transfer: 3 in 1;shower seat;ambulating;with caregiver independent in assisting;with min assist;with min guard assist;rolling walker  OT Frequency:     Barriers to D/C:  no barriers                        End of Session Equipment Utilized During Treatment: Gait belt;Rolling walker;Other (comment) (BSC) Nurse Communication: Mobility status  Activity Tolerance: Patient tolerated treatment well Patient left: in chair;with call bell/phone within reach (with SW)   Time: [id number] OT Time Calculation (min): 33 min Charges:  OT General Charges $OT Visit: 1 Procedure OT Evaluation $OT Eval Moderate Complexity: 1 Procedure OT Treatments $Self Care/Home Management : 8-22 mins $Therapeutic Activity: 8-22 mins G-Codes:    Britt Bottom 02/05/2017, 2:31 PM

## 2017-02-05 NOTE — Progress Notes (Signed)
NURSING PROGRESS NOTE  Lauren [name] [id number] Discharge Data: 02/05/2017 3:41 PM Attending Provider: Lupita Dawn, MD [id number], MD     [name] to be D/C'd Home per MD order.  Discussed with the patient and her husband the After Visit Summary and all questions fully answered. All IV's discontinued with no bleeding noted. All belongings returned to patient for patient to take home. Pt taken downstairs via wheelchair accompanied by a staff member.   Last Vital Signs:  Blood pressure (!) 156/89, pulse 90, temperature 97.7 F (36.5 C), temperature source Axillary, resp. rate 19, height 5\' 5"  (1.651 m), weight 75.9 kg (167 lb 4.8 oz), SpO2 92 %.  Discharge Medication List Allergies as of 02/05/2017      Reactions   Ambien [zolpidem Tartrate] Other (See Comments)   Per the pt, "it makes me go crazy"   Sulfa Antibiotics Rash   Elavil [amitriptyline Hcl] Other (See Comments)   Gastritis   Prozac [fluoxetine Hcl] Anxiety, Other (See Comments)   irritability      Medication List    STOP taking these medications   insulin glargine 100 UNIT/ML injection Commonly known as:  LANTUS     TAKE these medications   ACCU-CHEK AVIVA PLUS test strip Generic drug:  glucose blood 1 each by Other route as needed. Use as instructed   aspirin EC 81 MG tablet Take 81 mg by mouth daily.   betamethasone dipropionate 0.05 % ointment Commonly known as:  DIPROLENE Apply 1 application topically 2 (two) times daily as needed (psoriasis).   chlorthalidone 25 MG tablet Commonly known as:  HYGROTON Take 25 mg by mouth daily.   escitalopram 20 MG tablet Commonly known as:  LEXAPRO Take 20 mg by mouth daily.   esomeprazole 40 MG capsule Commonly known as:  NEXIUM Take 40 mg by mouth daily.   ferrous sulfate 325 (65 FE) MG tablet Take 325 mg by mouth 2 (two) times daily with a meal.   Fluticasone-Salmeterol 250-50 MCG/DOSE Aepb Commonly known as:  ADVAIR Inhale 1 puff into  the lungs every 12 (twelve) hours.   folic acid 1 MG tablet Commonly known as:  FOLVITE Take 1 mg by mouth daily.   gabapentin 600 MG tablet Commonly known as:  NEURONTIN Take 600 mg by mouth 6 (six) times daily. 6am, 10am, 4pm, 6pm, 10pm, midnight   levofloxacin 500 MG tablet Commonly known as:  LEVAQUIN Take 1 tablet (500 mg total) by mouth daily. Start taking on:  02/06/2017   losartan 100 MG tablet Commonly known as:  COZAAR Take 100 mg by mouth daily.   magnesium oxide 400 MG tablet Commonly known as:  MAG-OX Take 400 mg by mouth daily.   Melatonin 5 MG Tabs Take 10 mg by mouth at bedtime.   metaxalone 800 MG tablet Commonly known as:  SKELAXIN Take 800 mg by mouth 3 (three) times daily. 6am, noon, 6pm   metFORMIN 1000 MG tablet Commonly known as:  GLUCOPHAGE Take 1,000 mg by mouth 2 (two) times daily.   methotrexate 2.5 MG tablet Commonly known as:  RHEUMATREX Take 7.5 mg by mouth See admin instructions. Take 3 tablets (7.5 mg) every Tuesday morning and night (6 tablets total)Caution:Chemotherapy. Protect from light.   metoprolol succinate 50 MG 24 hr tablet Commonly known as:  TOPROL-XL Take 50 mg by mouth daily.   multivitamin with minerals Tabs tablet Take 1 tablet by mouth daily.   nitrofurantoin 100 MG capsule Commonly known as:  MACRODANTIN Take 100 mg by mouth at bedtime.   nitroGLYCERIN 0.4 MG SL tablet Commonly known as:  NITROSTAT Place 1 tablet (0.4 mg total) under the tongue every 5 (five) minutes as needed for chest pain.   nortriptyline 10 MG capsule Commonly known as:  PAMELOR Take 20 mg by mouth at bedtime.   NUCYNTA 50 MG tablet Generic drug:  tapentadol Take 50 mg by mouth every 6 (six) hours.   ondansetron 4 MG disintegrating tablet Commonly known as:  ZOFRAN-ODT Take 4 mg by mouth every 8 (eight) hours as needed for nausea or vomiting.   oxyCODONE-acetaminophen 10-325 MG tablet Commonly known as:  PERCOCET Take 1 tablet by  mouth every 4 (four) hours as needed for pain.   OXYCONTIN 30 MG 12 hr tablet Generic drug:  oxyCODONE Take 30 mg by mouth every 12 (twelve) hours.   potassium chloride 10 MEQ CR tablet Commonly known as:  KLOR-CON Take 10 mEq by mouth daily.   PROAIR RESPICLICK 123XX123 (90 Base) MCG/ACT Aepb Generic drug:  Albuterol Sulfate Inhale 2 puffs into the lungs 4 (four) times daily as needed (congestion, cough, wheezing).   PROBIOTIC PO Take 1 tablet by mouth at bedtime.   rosuvastatin 5 MG tablet Commonly known as:  CRESTOR Take 5 mg by mouth at bedtime.   varenicline 1 MG tablet Commonly known as:  CHANTIX Take 1 mg by mouth daily.   Vitamin B-12 CR 1500 MCG Tbcr Take 1,500 mcg by mouth daily.   Vitamin D3 1000 units Caps Take 1,000 Units by mouth daily.

## 2017-02-07 LAB — CULTURE, BLOOD (ROUTINE X 2): CULTURE: NO GROWTH

## 2017-02-16 ENCOUNTER — Other Ambulatory Visit: Payer: Self-pay | Admitting: Family Medicine

## 2017-02-16 ENCOUNTER — Ambulatory Visit
Admission: RE | Admit: 2017-02-16 | Discharge: 2017-02-16 | Disposition: A | Discharge status: Home / Self Care | Payer: Medicare Other | Source: Ambulatory Visit | Attending: Family Medicine | Admitting: Family Medicine

## 2017-02-16 DIAGNOSIS — Z09 Encounter for follow-up examination after completed treatment for conditions other than malignant neoplasm: Secondary | ICD-10-CM

## 2017-02-17 ENCOUNTER — Telehealth: Payer: Self-pay | Admitting: Neurology

## 2017-02-17 NOTE — Telephone Encounter (Signed)
This patient saw you for NCV/EMG in November 2017 and now Dr Sandi Mariscal is referring to Dr Jaynee Eagles for severe peripheral neuropathy unable to walk Is it ok to switch? dg

## 2017-02-17 NOTE — Telephone Encounter (Signed)
Okay with me. The patient was never an established patient of mine, only saw the patient for EMG evaluation from outside referral.

## 2017-02-17 NOTE — Telephone Encounter (Signed)
That's fine thanks for asking!

## 2017-02-17 NOTE — Telephone Encounter (Signed)
This patient saw Dr Jannifer Franklin in November 2017 for NCV/EMG now Dr Sandi Mariscal is referring to you for consult for severe peripheral neuropathy unable to walk Is it ok to switch to you? Please advise Thanks dg

## 2017-03-05 ENCOUNTER — Encounter: Payer: Self-pay | Admitting: Neurology

## 2017-03-05 ENCOUNTER — Ambulatory Visit (INDEPENDENT_AMBULATORY_CARE_PROVIDER_SITE_OTHER): Payer: Medicare Other | Admitting: Neurology

## 2017-03-05 VITALS — BP 120/67 | HR 68 | Ht 65.0 in | Wt 170.0 lb

## 2017-03-05 DIAGNOSIS — G6289 Other specified polyneuropathies: Secondary | ICD-10-CM

## 2017-03-05 DIAGNOSIS — I6523 Occlusion and stenosis of bilateral carotid arteries: Secondary | ICD-10-CM | POA: Diagnosis not present

## 2017-03-05 DIAGNOSIS — G130 Paraneoplastic neuromyopathy and neuropathy: Secondary | ICD-10-CM | POA: Diagnosis not present

## 2017-03-05 DIAGNOSIS — E5111 Dry beriberi: Secondary | ICD-10-CM

## 2017-03-05 DIAGNOSIS — R531 Weakness: Secondary | ICD-10-CM | POA: Diagnosis not present

## 2017-03-05 DIAGNOSIS — R2689 Other abnormalities of gait and mobility: Secondary | ICD-10-CM | POA: Diagnosis not present

## 2017-03-05 DIAGNOSIS — E560 Deficiency of vitamin E: Secondary | ICD-10-CM

## 2017-03-05 DIAGNOSIS — G63 Polyneuropathy in diseases classified elsewhere: Secondary | ICD-10-CM

## 2017-03-05 DIAGNOSIS — R270 Ataxia, unspecified: Secondary | ICD-10-CM | POA: Diagnosis not present

## 2017-03-05 DIAGNOSIS — E568 Deficiency of other vitamins: Secondary | ICD-10-CM

## 2017-03-05 DIAGNOSIS — E531 Pyridoxine deficiency: Secondary | ICD-10-CM

## 2017-03-05 DIAGNOSIS — M48061 Spinal stenosis, lumbar region without neurogenic claudication: Secondary | ICD-10-CM

## 2017-03-05 NOTE — Progress Notes (Addendum)
[facility]    Provider:  Dr Jaynee Eagles Referring Provider: [name], MD Primary Care Physician:  Marylene Land, MD  CC:  Leg pain and weakness  HPI:  Lauren [name] is a 66 y.o. female here as a referral from Dr. Sandi Mariscal for chronic leg pain and weakness. She has a past medical history of chronic low back pain and lumbar radicular symptoms status post lumbar surgery with a fusion in 2014, peripheral neuropathy, history of chemotherapy in 2009, insulin-dependent diabetes, hypertension, COPD, anemia, bilateral carotid stenosis since 2011 and a right carotid endarterectomy in 2016, endometrial cancer in 2009 and had a recurrence in 2011 which was treated with radiation, hyperlipidemia, depression, anxiety, insomnia, hypertension, venous insufficiency  She has chronic pain her feet started swelling last year. The pain increased with swelling. The pain is in her feet and toes. Worse in the right foot  But the left is severe as well. She has burning, numbness and tingling in the feet. Swelling worsens the pain. She has been eating a lot of salt. The pain in the feet is what is significantly bothering her now and progressing. The back pain and radicular symptoms are stable bu the foot pain has gotten more severe. She has horrible cramping in the feet she wakes husband up yelling but none recently. Her feet "slump" she has weak ankles. Perioheral neuropathy happened after chemotherapy. Slowly progressive for many years worse in the last year.  She can feel the pain up to her knees. It is very numb. She has foot drop both feet. The foot drop is not associated with the back. In fact pain in the back has improved. She drops things a lot but no similar sensory changes in the hands.At one point her HgbA1c was 9 but recently much better. Foot drop in the last 6 months.   Reviewed notes, labs and imaging from outside physicians, which showed: Patient has chronic leg pain and weakness.  Over the last few years she has had lumbar radicular symptoms and had lumbar surgery with a fusion in 2014. This subsequently became infected with MRSA and she required removal of hardware in 2015. In November she had an nerve conduction and EMG which was consistent with a peripheral neuropathy in the legs with a possible element of mild L5 radiculopathy. She has a number of complex medical problems and is actually followed up a treatment center in [place]. She is being weaned off of Nucynta and OxyContin. Her peripheral neuropathy may very well be from chemotherapy that she had in 2009 possibly exacerbated by her diabetes. I did check a B12 and methylmalonic acid on her several years ago when repeated in December and those were normal. Her biggest problem recently has been that she has had progressive leg weakness to the point that she is having difficulty walking at all. She is currently on gabapentin. 2 years ago switched her to Lyrica. She thought it was more effective for the burning paresthesias that she has a more severe dull aching leg pain that she experiences have done better on the gabapentin. She is also currently on nortriptyline which is probably giving her some pain relief although she is taking that primarily to help her sleep.  CRP 10.2, TSH 2.4, uric acid normal, CMP showed creatinine 1.15 and BUN 16, hemoglobin A1c 5.8, CBC with anemia 9.9/32.5 these labs were drawn in September 2017. Labs drawn in December 2017 showed a vitamin B12 of greater than 2000 with normal methylmalonic acid, hemoglobin 11  and hematocrit 36.1 with normal MCV. Labs drawn in February 2018 showed a CMP with BUN 16 and creatinine 0.85 otherwise unremarkable and hemoglobin A1c was 6. low ferritin 18 back in September 2017.  NERVE CONDUCTION STUDIES:  Nerve conduction studies were done on the left upper extremity. The distal motor latency for the left median nerve was prolonged with a normal motor amplitude. The F  wave latency and nerve conduction velocity for this nerve was normal. The left median sensory latency was prolonged.  Nerve conduction studies were performed on both lower extremities. The studies of the peroneal nerves were unobtainable bilaterally. The distal motor latencies for the posterior tibial nerves were prolonged bilaterally with low motor amplitudes seen for these nerves bilaterally. The nerve conduction velocities for the posterior tibial nerves were normal bilaterally. The H reflex latencies were unobtainable bilaterally. The sural and peroneal sensory latencies were unobtainable bilaterally.  EMG STUDIES: Personally reviewed EMG and nerve conduction study values and data and agree with the following  EMG study was performed on the left lower extremity:  The tibialis anterior muscle reveals no voluntary motor units with no recruitment. 3+ fibrillations and positive waves were seen. The peroneus tertius muscle reveals no voluntary motor units with no recruitment. 3+ fibrillations and positive waves were seen. The medial gastrocnemius muscle reveals 1 to 3K motor units with markedly decreased recruitment. 3+ fibrillations and positive waves were seen. The vastus lateralis muscle reveals 2 to 4K motor units with slightly decreased recruitment. No fibrillations or positive waves were seen. The iliopsoas muscle reveals 2 to 4K motor units with full recruitment. No fibrillations or positive waves were seen. The biceps femoris muscle (long head) reveals 2 to 3K motor units with slightly decreased recruitment. 1+ fibrillations and positive waves were seen. The lumbosacral paraspinal muscles were tested at 3 levels, and revealed no abnormalities of insertional activity at all 3 levels tested. There was good relaxation.  EMG study was performed on the right lower extremity:  The tibialis anterior muscle reveals 1 to 2K motor units with markedly decreased recruitment. 3+ fibrillations and  positive waves were seen. The peroneus tertius muscle reveals 1 to 2K motor units with markedly decreased recruitment. 3+ fibrillations and positive waves were seen. The medial gastrocnemius muscle reveals 1 to 3K motor units with decreased recruitment. 2+ fibrillations and positive waves were seen. The vastus lateralis muscle reveals 2 to 5K motor units with decreased recruitment. No fibrillations or positive waves were seen. The iliopsoas muscle reveals 2 to 4K motor units with full recruitment. No fibrillations or positive waves were seen. The biceps femoris muscle (long head) reveals 2 to 3K motor units with slightly decreased recruitment. No fibrillations or positive waves were seen. The lumbosacral paraspinal muscles were not tested, the patient was unable to roll on her left side.   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 There is an overlying mild left carpal tunnel syndrome. EMG evaluation of the left lower extremity shows distal acute and chronic signs of denervation consistent with the diagnosis of peripheral neuropathy, there may be an overlying mild acute L5 radiculopathy. EMG evaluation of the right lower extremity shows distal acute and chronic signs of denervation consistent with a severe peripheral neuropathy. An overlying lumbosacral radiculopathy on this side cannot be confirmed.   Review of Systems: Patient complains of symptoms per HPI as well as the following symptoms: Fatigue,  blurred vision, double vision, anemia, swelling in legs, increased thirst, incontinence, diarrhea, cramps, aching muscles, slurred speech, dizziness, insomnia, depression, not enough sleep, decreased energy. Pertinent negatives per HPI. All others negative.   Social History   Social History  . Marital status: Married    Spouse name: N/A  . Number of children:  1  . Years of education: College   Occupational History  . disabled    Social History Main Topics  . Smoking status: Former Smoker    Packs/day: 0.10    Years: 40.00    Types: Cigarettes    Quit date: 01/2017  . Smokeless tobacco: Never Used     Comment: stated smoking 1 cigarette/ day; trying to quit  . Alcohol use No  . Drug use: No  . Sexual activity: No   Other Topics Concern  . Not on file   Social History Narrative   Married, 1 daughter   Disabled   Right-hand   Caffeine: caffeine free sodas    Family History  Problem Relation Age of Onset  . Stroke Mother   . Irritable bowel syndrome Mother   . Heart attack Father 71    died  . Heart disease Father     Heart Disease before age 55, rheumatic heart disease  . Hyperlipidemia Father   . Hypertension Father   . Diabetes Daughter   . Hypertension Daughter   . Irritable bowel syndrome Daughter   . Colon cancer Neg Hx   . Esophageal cancer Neg Hx   . Pancreatic cancer Neg Hx   . Rectal cancer Neg Hx   . Stomach cancer Neg Hx   . Neuropathy Neg Hx     Past Medical History:  Diagnosis Date  . Anemia   . Anxiety   . CAD (coronary artery disease)   . Carotid artery occlusion   . Cataract    bil cateracts removed  . COPD (chronic obstructive pulmonary disease) ([place])   . Cough   . DDD (degenerative disc disease), lumbar   . Depression   . Diabetes mellitus   . Endometrial ca [facility])    endometrial ca dx 4/11  . GERD (gastroesophageal reflux disease)   . Gout 2015  . Hernia of flank   . Hyperlipidemia   . Hypertension   . Insomnia   . OAB (overactive bladder)   . Osteoarthritis   . Peripheral neuropathy ([place])    following chemotherapy  . Psoriasis   . Pulmonary nodule    since 2016  . Radiation 05/01/2011-06/11/11   5600 cGy 28 fxs/periaortic  . Stroke ([place])    tia  . TIA (transient ischemic attack) 2015  . 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 Uterine cancer ([place])     . Ventral hernia    postoperative since 2009    Past Surgical History:  Procedure Laterality Date  . ABDOMINAL HYSTERECTOMY  2009   for ca  . BACK SURGERY  Aug/sept 2014   3 back surgeries  (Aug 2014 & Sept 2014)  . CHOLECYSTECTOMY     biliteral  . COLONOSCOPY  11/13/2014   negative except for a small cecal AVM and 1 polyp  . ENDARTERECTOMY Right 08/17/2015   Procedure: Right Carotid Endartarectomy with patch Angioplasty ;  Surgeon: Mal Misty, MD;  Location: [facility];  Service: Vascular;  Laterality: Right;  . EYE SURGERY Right Aug. 2013   Cataract, lens implant  . EYE SURGERY Left  Sept. 2013   Cataract, lens implant  . I & D of lumbar fusion /removal of hardware for chronic infection  Jan. 8, 2015  . right carotid endarterectomy  2016  . SPINE SURGERY      Current Outpatient Prescriptions  Medication Sig Dispense Refill  . Albuterol Sulfate (PROAIR RESPICLICK) 297 (90 Base) MCG/ACT AEPB Inhale 2 puffs into the lungs 4 (four) times daily as needed (congestion, cough, wheezing).    Marland Kitchen aspirin EC 81 MG tablet Take 81 mg by mouth daily.    . betamethasone dipropionate (DIPROLENE) 0.05 % ointment Apply 1 application topically 2 (two) times daily as needed (psoriasis).     . chlorthalidone (HYGROTON) 25 MG tablet Take 25 mg by mouth daily.     . Cholecalciferol (VITAMIN D3) 1000 UNITS CAPS Take 1,000 Units by mouth daily.     . Cyanocobalamin (VITAMIN B-12 CR) 1500 MCG TBCR Take 1,500 mcg by mouth daily.     Marland Kitchen doxycycline (VIBRAMYCIN) 100 MG capsule     . escitalopram (LEXAPRO) 20 MG tablet Take 20 mg by mouth daily.    Marland Kitchen esomeprazole (NEXIUM) 40 MG capsule Take 40 mg by mouth daily.     . ferrous sulfate 325 (65 FE) MG tablet Take 325 mg by mouth 2 (two) times daily with a meal.    . Fluticasone-Salmeterol (ADVAIR) 250-50 MCG/DOSE AEPB Inhale 1 puff into the lungs every 12 (twelve) hours.      . folic acid (FOLVITE) 1 MG tablet Take 1 mg by mouth daily.    Marland Kitchen gabapentin  (NEURONTIN) 600 MG tablet Take 600 mg by mouth 6 (six) times daily. 6am, 10am, 4pm, 6pm, 10pm, midnight    . glucose blood (ACCU-CHEK AVIVA PLUS) test strip 1 each by Other route as needed. Use as instructed     . losartan (COZAAR) 100 MG tablet Take 100 mg by mouth daily.      . magnesium oxide (MAG-OX) 400 MG tablet Take 400 mg by mouth daily.    . Melatonin 5 MG TABS Take 10 mg by mouth at bedtime.    . metaxalone (SKELAXIN) 800 MG tablet Take 800 mg by mouth 3 (three) times daily. 6am, noon, 6pm    . metFORMIN (GLUCOPHAGE) 1000 MG tablet Take 1,000 mg by mouth 2 (two) times daily.    . methotrexate (RHEUMATREX) 2.5 MG tablet Take 7.5 mg by mouth See admin instructions. Take 3 tablets (7.5 mg) every Tuesday morning and night (6 tablets total)Caution:Chemotherapy. Protect from light.    . metoprolol succinate (TOPROL-XL) 50 MG 24 hr tablet Take 50 mg by mouth daily.    . Multiple Vitamin (MULTIVITAMIN WITH MINERALS) TABS tablet Take 1 tablet by mouth daily.    . nitrofurantoin (MACRODANTIN) 100 MG capsule Take 100 mg by mouth at bedtime.     . nitroGLYCERIN (NITROSTAT) 0.4 MG SL tablet Place 1 tablet (0.4 mg total) under the tongue every 5 (five) minutes as needed for chest pain. 25 tablet 3  . nortriptyline (PAMELOR) 10 MG capsule Take 20 mg by mouth at bedtime.     . ondansetron (ZOFRAN-ODT) 4 MG disintegrating tablet Take 4 mg by mouth every 8 (eight) hours as needed for nausea or vomiting.     . OXYCONTIN 30 MG 12 hr tablet Take 30 mg by mouth every 12 (twelve) hours.    . potassium chloride (KLOR-CON) 10 MEQ CR tablet Take 10 mEq by mouth daily.      . Probiotic Product (PROBIOTIC  PO) Take 1 tablet by mouth at bedtime.    . rosuvastatin (CRESTOR) 5 MG tablet Take 5 mg by mouth at bedtime.    . tapentadol (NUCYNTA) 50 MG tablet Take 50 mg by mouth every 6 (six) hours.    . varenicline (CHANTIX) 1 MG tablet Take 1 mg by mouth daily.      Current Facility-Administered Medications   Medication Dose Route Frequency Provider Last Rate Last Dose  . 0.9 %  sodium chloride infusion  500 mL Intravenous Continuous Gatha Mayer, MD        Allergies as of 03/05/2017 - Review Complete 03/05/2017  Allergen Reaction Noted  . Ambien [zolpidem tartrate] Other (See Comments) 11/06/2014  . Sulfa antibiotics Rash 03/13/2011  . Elavil [amitriptyline hcl] Other (See Comments) 10/31/2014  . Prozac [fluoxetine hcl] Anxiety and Other (See Comments) 11/08/2014    Vitals: BP 120/67   Pulse 68   Ht '5\' 5"'  (1.651 m)   Wt 170 lb (77.1 kg)   BMI 28.29 kg/m  Last Weight:  Wt Readings from Last 1 Encounters:  03/05/17 170 lb (77.1 kg)   Last Height:   Ht Readings from Last 1 Encounters:  03/05/17 '5\' 5"'  (1.651 m)     Could not bear weigh even with help, apparently can walk with a walker but could not get up with pour support  Physical exam: Exam: Gen: NAD, conversant, well nourised, obese, well groomed                     CV: RRR, no MRG. No Carotid Bruits. No peripheral edema, warm, nontender Eyes: Conjunctivae clear without exudates or hemorrhage  Neuro: Detailed Neurologic Exam  Speech:    Speech is normal; fluent and spontaneous with normal comprehension.  Cognition:    The patient is oriented to person, place, and time;     recent and remote memory intact;     language fluent;     normal attention, concentration,     fund of knowledge Cranial Nerves:    The pupils are equal, round, and reactive to light. Attempted fundoscopic exam could not visualize. . Visual fields are full to finger confrontation. Extraocular movements are intact. Trigeminal sensation is intact and the muscles of mastication are normal. The face is symmetric. The palate elevates in the midline. Hearing intact. Voice is normal. Shoulder shrug is normal. The tongue has normal motion without fasciculations.   Coordination:    Normal finger to nose, cannot perform HTS due to weakness  Gait:     Cannot bear weight independently, few steps wide based but cannot fully assess gait due to weakness.  Motor Observation: Mild distal atrophy most pronounced in  FDI of hand and inrinsic foot muscles  and no involuntary movements noted. Tone:     increased LE tone vs paratonia vs pain  Posture:    Posture is normal in wheelchair    Strength: Left deltoid: injury, Weak grip hands, Otherwise 5 to 5- uppers hip flexion 3/5, leg extension flexion 2/5, DF 0 bilat     Sensation:   Absent pp to elbows and above knees Absent vibration great toes and patella priprioception mostly intact possibly mild decreased     Reflex Exam:  DTR's:  Brisk biceps Absent lowers    Toes:    The toes are equivocal bilaterally.   Clonus:    Clonus is absent.       Assessment/Plan:  Very nice 66 year old female here  with her husband for evaluation of a progressive length-dependent, severe, axonal, sensorimotor neuropathy.  MRI showed severe lumbar stenosis which may be the cause and she will be referred to [place]. Axonal sensorimotor polyneuropathies carry an extensive list of differential diagnoses. Causes may be metabolic, toxic, nutritional, iatrogenic, infectious, immune mediated, vascular, neoplastic, paraneoplastic and genetic. Diabetes mellitus and prediabetes are the most common causes of neuropathy.  Patient does have a history of uncontrolled diabetes. Also patient reports her diabetes last year was uncontrolled and has only recently come down to normal hgba1c which raises the question of treatment induced neuropathy of diabetes where neuropathy may occur within weeks of rapid glycemic control. Patient also has a history of neurotoxic medications and chemotherapy. No alcohol overuse. Need to check for other causes including monoclonal gammopathy, vitamin deficiency. Also recommend lumbar puncture to evaluate for axonal form of CIDP. Axonal neuropathies can happen in systemic illnesses including alcoholic  polyneuropathy, uremic neuropathy, amyloid neuropathy, porphyria. Vitamin deficiencies such as B12 and thiamine can cause acute or subacute progressive polyneuropathy. Chemotherapy is the leading cause of medication-induced neuropathies and diabetes is the most common predisposing factor for the development of chemotherapy-induced polyneuropathy.  -May consider daily alpha lipoic acid which is an antioxidant that may reduce free radical oxidative stress associated with diabetic polyneuropathy, existing evidence suggests that alpha lipoic acid significantly reduces stabbing, lancinating and burning pain and diabetic neuropathy with its onset of action as early as 1-2 weeks.  - needs NSY consult for severe L4/L5 spinal stenosis  (L4-5: There is severe narrowing of the thecal sac at this level. The patient is status post posterior decompression. Severe bilateralforaminal narrowing is present. The appearance is unchanged compared to the prior CT myelogram.)  L5-S1: Status post posterior to the the thecal sac appears decompressed. Marked bilateral foraminal narrowing is present.)  - Will order an extensive panel of blood tests as per above and if no etiology is found consider lumbar puncture   MRI 01/2017:  MRI lumbar spine: L4-5: There is severe narrowing of the thecal sac at this level. The patient is status post posterior decompression. Severe bilateral foraminal narrowing is present. The appearance is unchanged compared to the prior CT myelogram(2017)  L5-S1: Status post posterior to the the thecal sac appears decompressed. Marked bilateral foraminal narrowing is present.  Orders Placed This Encounter  Procedures  . Vitamin B1  . Vitamin B6  . Multiple Myeloma Panel (SPEP&IFE w/QIG)  . Vitamin E  . Hepatitis C antibody  . Cryoglobulin  . Heavy metals, blood  . Angiotensin converting enzyme  . HIV antibody  . Sedimentation rate  . ANA w/Reflex  . Sjogren's syndrome antibods(ssa + ssb)   . Pan-ANCA  . B. burgdorfi Antibody  . Copper, serum  . CK  . Paraneoplastic Profile 1   Cc: Dr. Glenna Durand, MD  [facility] [address] [place], [place] [address]  Phone [phone] Fax [phone]

## 2017-03-08 ENCOUNTER — Encounter: Payer: Self-pay | Admitting: Neurology

## 2017-03-08 DIAGNOSIS — G6289 Other specified polyneuropathies: Secondary | ICD-10-CM | POA: Insufficient documentation

## 2017-03-10 ENCOUNTER — Ambulatory Visit
Admission: RE | Admit: 2017-03-10 | Discharge: 2017-03-10 | Disposition: A | Discharge status: Home / Self Care | Payer: Medicare Other | Source: Ambulatory Visit | Attending: Family Medicine | Admitting: Family Medicine

## 2017-03-10 ENCOUNTER — Other Ambulatory Visit: Payer: Self-pay | Admitting: Family Medicine

## 2017-03-10 ENCOUNTER — Other Ambulatory Visit (INDEPENDENT_AMBULATORY_CARE_PROVIDER_SITE_OTHER): Payer: Self-pay

## 2017-03-10 DIAGNOSIS — R911 Solitary pulmonary nodule: Secondary | ICD-10-CM

## 2017-03-10 DIAGNOSIS — G130 Paraneoplastic neuromyopathy and neuropathy: Secondary | ICD-10-CM

## 2017-03-10 DIAGNOSIS — E531 Pyridoxine deficiency: Secondary | ICD-10-CM

## 2017-03-10 DIAGNOSIS — Z0289 Encounter for other administrative examinations: Secondary | ICD-10-CM

## 2017-03-10 DIAGNOSIS — Z09 Encounter for follow-up examination after completed treatment for conditions other than malignant neoplasm: Secondary | ICD-10-CM

## 2017-03-10 DIAGNOSIS — J189 Pneumonia, unspecified organism: Secondary | ICD-10-CM

## 2017-03-10 DIAGNOSIS — R2689 Other abnormalities of gait and mobility: Secondary | ICD-10-CM

## 2017-03-10 DIAGNOSIS — J181 Lobar pneumonia, unspecified organism: Secondary | ICD-10-CM

## 2017-03-10 DIAGNOSIS — R531 Weakness: Secondary | ICD-10-CM

## 2017-03-10 DIAGNOSIS — E560 Deficiency of vitamin E: Secondary | ICD-10-CM

## 2017-03-10 DIAGNOSIS — E5111 Dry beriberi: Secondary | ICD-10-CM

## 2017-03-10 DIAGNOSIS — G6289 Other specified polyneuropathies: Secondary | ICD-10-CM

## 2017-03-10 DIAGNOSIS — R270 Ataxia, unspecified: Secondary | ICD-10-CM

## 2017-03-10 DIAGNOSIS — G63 Polyneuropathy in diseases classified elsewhere: Secondary | ICD-10-CM

## 2017-03-13 LAB — HIV ANTIBODY (ROUTINE TESTING W REFLEX): HIV Screen 4th Generation wRfx: NONREACTIVE

## 2017-03-13 LAB — MULTIPLE MYELOMA PANEL, SERUM
ALPHA2 GLOB SERPL ELPH-MCNC: 1.3 g/dL — AB (ref 0.4–1.0)
Albumin SerPl Elph-Mcnc: 3.2 g/dL (ref 2.9–4.4)
Albumin/Glob SerPl: 1 (ref 0.7–1.7)
Alpha 1: 0.3 g/dL (ref 0.0–0.4)
B-Globulin SerPl Elph-Mcnc: 0.9 g/dL (ref 0.7–1.3)
Gamma Glob SerPl Elph-Mcnc: 0.8 g/dL (ref 0.4–1.8)
Globulin, Total: 3.3 g/dL (ref 2.2–3.9)
IGG (IMMUNOGLOBIN G), SERUM: 741 mg/dL (ref 700–1600)
IGM (IMMUNOGLOBULIN M), SRM: 46 mg/dL (ref 26–217)
IgA/Immunoglobulin A, Serum: 174 mg/dL (ref 87–352)
TOTAL PROTEIN: 6.5 g/dL (ref 6.0–8.5)

## 2017-03-13 LAB — HEAVY METALS, BLOOD
Arsenic: 5 ug/L (ref 2–23)
Lead, Blood: NOT DETECTED ug/dL (ref 0–19)
MERCURY: NOT DETECTED ug/L (ref 0.0–14.9)

## 2017-03-13 LAB — VITAMIN B6: Vitamin B6: 23.9 ug/L (ref 2.0–32.8)

## 2017-03-13 LAB — CK: Total CK: 55 U/L (ref 24–173)

## 2017-03-13 LAB — COPPER, SERUM: Copper: 155 ug/dL (ref 72–166)

## 2017-03-13 LAB — PAN-ANCA
C-ANCA: <1:20 {titer}
Myeloperoxidase Ab: <9 U/mL (ref 0.0–9.0)
P-ANCA: <1:20 {titer}

## 2017-03-13 LAB — VITAMIN B1: THIAMINE: 159 nmol/L (ref 66.5–200.0)

## 2017-03-13 LAB — SJOGREN'S SYNDROME ANTIBODS(SSA + SSB)
ENA SSA (RO) Ab: <0.2 AI (ref 0.0–0.9)
ENA SSB (LA) Ab: <0.2 AI (ref 0.0–0.9)

## 2017-03-13 LAB — PARANEOPLASTIC PROFILE 1
Neuronal Nuc Ab (Ri), IFA: <1:10 {titer}
Purkinje Cell (Yo) Autoantobodies- IFA: <1:10 {titer}

## 2017-03-13 LAB — ANGIOTENSIN CONVERTING ENZYME: ANGIO CONVERT ENZYME: 50 U/L (ref 14–82)

## 2017-03-13 LAB — VITAMIN E: Vitamin E (Alpha Tocopherol): 14.3 mg/L (ref 6.5–21.5)

## 2017-03-13 LAB — SEDIMENTATION RATE: SED RATE: 36 mm/h (ref 0–40)

## 2017-03-13 LAB — B. BURGDORFI ANTIBODIES

## 2017-03-13 LAB — ANA W/REFLEX: ANA: NEGATIVE

## 2017-03-13 LAB — HEPATITIS C ANTIBODY: Hep C Virus Ab: <0.1 s/co ratio (ref 0.0–0.9)

## 2017-03-16 ENCOUNTER — Telehealth: Payer: Self-pay | Admitting: Neurology

## 2017-03-16 NOTE — Telephone Encounter (Signed)
Labs were all unremarkable, I do think patient needs a lumbar puncture next. Would you discuss with her and if sje agrees let me know and I can order. thanks

## 2017-03-16 NOTE — Telephone Encounter (Signed)
Called and spoke w/ pt's husband. They would like to proceed w/ LP as recommended by Dr. Jaynee Eagles.

## 2017-03-18 ENCOUNTER — Telehealth: Payer: Self-pay

## 2017-03-18 NOTE — Telephone Encounter (Signed)
Received fax from PCP w/ attached lab results (CBC, met C, iron) drawn 03/13/17. All WNL except the following:  Glucose 113 H Chloride 97 L RBC 3.56 L HGB 10.2 L HCT 31.2 L RDW 17.0 H PLT 433 H  Dr. Sandi Mariscal also mentioned that an MRI done during her hospitalization on 02/04/17 which showed severe bilateral foraminal narrowing at L5-S1 which might be a factor in her leg weakness. Given to Dr. Jaynee Eagles for review.

## 2017-03-19 ENCOUNTER — Other Ambulatory Visit: Payer: Self-pay | Admitting: Neurology

## 2017-03-19 DIAGNOSIS — G6181 Chronic inflammatory demyelinating polyneuritis: Secondary | ICD-10-CM

## 2017-03-22 ENCOUNTER — Telehealth: Payer: Self-pay | Admitting: Neurology

## 2017-03-22 NOTE — Telephone Encounter (Signed)
After a review of her MRi lumbar spine, I think patient needs to be seen in [place]. Delsa Sale can you call patient and let her know? We may hold off on the lumbar puncure until NSY sees her looks like she had severe stenosis which could be causing her problems. I am placing a NSY consult thanks.  L4-5: There is severe narrowing of the thecal sac at this level. The patient is status post posterior decompression. Severe bilateral foraminal narrowing is present. The appearance is unchanged compared to the prior CT myelogram.  L5-S1: Status post posterior to the the thecal sac appears decompressed. Marked bilateral foraminal narrowing is present.

## 2017-03-22 NOTE — Addendum Note (Signed)
Addended by: Sarina Ill B on: 03/22/2017 09:58 PM   Modules accepted: Orders

## 2017-03-22 NOTE — Telephone Encounter (Signed)
Lauren [name], can you make sure she is scheduled for LP please? I placed the order wanted to make sure.

## 2017-03-23 NOTE — Telephone Encounter (Signed)
LP ordered but not yet scheduled. See new order/MD notes re: neurosurg referral.

## 2017-03-23 NOTE — Telephone Encounter (Signed)
Called pt w/ abnormal MRI results and notified her of neurosurg referral. Questions answered. Verbalized understanding and appreciation for call.

## 2017-04-03 ENCOUNTER — Other Ambulatory Visit: Payer: Medicare Other

## 2017-04-07 ENCOUNTER — Telehealth: Payer: Self-pay | Admitting: Neurology

## 2017-04-07 ENCOUNTER — Encounter: Payer: Self-pay | Admitting: Neurology

## 2017-04-07 NOTE — Telephone Encounter (Signed)
Pt's husband called said he wanted to know if the appt for LP should be c/a on 4/12. I told him his email was rec'd, RN has rec'd and forwarded it provider. I advised him the email would be answered, he asked if it would be today. He said if they don't hear back today he will c/a appt. I told him to wait to hear back from RN before c/a that appt.Marland Kitchen He understood.

## 2017-04-08 NOTE — Telephone Encounter (Signed)
Pt's husband called earlier today. Said that she did go to [facility] on Mon but MD said that he would not want to do additional surg on pt as she has had 3 in the past w/ scar tissue. Husband states that pt plans on returning to Dr. Earlean Polka (who did her 1st three surgeries) in May so that he can perform the 4th surgery. Let him know that pt could wait until after neurosurg appt for LP to see if Dr. Earlean Polka has other recommendations. Otherwise, can go ahead w/ LP to r/o other causes for her symptoms. He agreed to discuss w/ pt and will decide to either keep LP appt tomorrow or cancel for now.

## 2017-04-09 ENCOUNTER — Other Ambulatory Visit: Payer: Self-pay | Admitting: Neurology

## 2017-04-09 ENCOUNTER — Encounter: Payer: Self-pay | Admitting: Cardiology

## 2017-04-09 ENCOUNTER — Ambulatory Visit
Admission: RE | Admit: 2017-04-09 | Discharge: 2017-04-09 | Disposition: A | Discharge status: Home / Self Care | Payer: Medicare Other | Source: Ambulatory Visit | Attending: Neurology | Admitting: Neurology

## 2017-04-09 DIAGNOSIS — G6181 Chronic inflammatory demyelinating polyneuritis: Secondary | ICD-10-CM

## 2017-04-09 NOTE — Discharge Instructions (Signed)

## 2017-04-16 ENCOUNTER — Ambulatory Visit
Admission: RE | Admit: 2017-04-16 | Discharge: 2017-04-16 | Disposition: A | Discharge status: Home / Self Care | Payer: Medicare Other | Source: Ambulatory Visit | Attending: Neurology | Admitting: Neurology

## 2017-04-16 VITALS — BP 184/95 | HR 64

## 2017-04-16 DIAGNOSIS — G6181 Chronic inflammatory demyelinating polyneuritis: Secondary | ICD-10-CM

## 2017-04-16 LAB — CSF CELL COUNT WITH DIFFERENTIAL
RBC Count, CSF: 1 cells/uL (ref 0–10)
WBC CSF: 0 {cells}/uL (ref 0–5)

## 2017-04-16 LAB — GLUCOSE, CSF: GLUCOSE CSF: 63 mg/dL (ref 43–76)

## 2017-04-16 LAB — PROTEIN, CSF: Total Protein, CSF: 148 mg/dL — ABNORMAL HIGH (ref 15–60)

## 2017-04-16 NOTE — Discharge Instructions (Signed)

## 2017-04-16 NOTE — Progress Notes (Signed)
Discharge instructions reviewed.

## 2017-04-17 ENCOUNTER — Ambulatory Visit: Payer: Medicare Other | Admitting: Podiatry

## 2017-04-20 ENCOUNTER — Telehealth: Payer: Self-pay | Admitting: *Deleted

## 2017-04-20 NOTE — Telephone Encounter (Signed)
-----   Message from Melvenia Beam, MD sent at 04/16/2017  6:31 PM EDT ----- Please disregard last result note, patient not seen for Pseudotumor thanks

## 2017-04-21 LAB — CSF CULTURE W GRAM STAIN
Gram Stain: NONE SEEN
Gram Stain: NONE SEEN
Organism ID, Bacteria: NO GROWTH

## 2017-04-22 ENCOUNTER — Encounter (HOSPITAL_BASED_OUTPATIENT_CLINIC_OR_DEPARTMENT_OTHER): Payer: Medicare Other | Attending: Surgery

## 2017-04-22 DIAGNOSIS — Z79899 Other long term (current) drug therapy: Secondary | ICD-10-CM | POA: Diagnosis not present

## 2017-04-22 DIAGNOSIS — Z9221 Personal history of antineoplastic chemotherapy: Secondary | ICD-10-CM | POA: Diagnosis not present

## 2017-04-22 DIAGNOSIS — Z7982 Long term (current) use of aspirin: Secondary | ICD-10-CM | POA: Insufficient documentation

## 2017-04-22 DIAGNOSIS — E1140 Type 2 diabetes mellitus with diabetic neuropathy, unspecified: Secondary | ICD-10-CM | POA: Insufficient documentation

## 2017-04-22 DIAGNOSIS — Z794 Long term (current) use of insulin: Secondary | ICD-10-CM | POA: Insufficient documentation

## 2017-04-22 DIAGNOSIS — E785 Hyperlipidemia, unspecified: Secondary | ICD-10-CM | POA: Diagnosis not present

## 2017-04-22 DIAGNOSIS — E11621 Type 2 diabetes mellitus with foot ulcer: Secondary | ICD-10-CM | POA: Diagnosis present

## 2017-04-22 DIAGNOSIS — Z923 Personal history of irradiation: Secondary | ICD-10-CM | POA: Diagnosis not present

## 2017-04-22 DIAGNOSIS — D509 Iron deficiency anemia, unspecified: Secondary | ICD-10-CM | POA: Diagnosis not present

## 2017-04-22 DIAGNOSIS — Z87891 Personal history of nicotine dependence: Secondary | ICD-10-CM | POA: Insufficient documentation

## 2017-04-22 DIAGNOSIS — L84 Corns and callosities: Secondary | ICD-10-CM | POA: Diagnosis not present

## 2017-04-22 DIAGNOSIS — I2510 Atherosclerotic heart disease of native coronary artery without angina pectoris: Secondary | ICD-10-CM | POA: Insufficient documentation

## 2017-04-22 DIAGNOSIS — I10 Essential (primary) hypertension: Secondary | ICD-10-CM | POA: Insufficient documentation

## 2017-04-22 DIAGNOSIS — Z7951 Long term (current) use of inhaled steroids: Secondary | ICD-10-CM | POA: Insufficient documentation

## 2017-04-22 DIAGNOSIS — J449 Chronic obstructive pulmonary disease, unspecified: Secondary | ICD-10-CM | POA: Insufficient documentation

## 2017-04-22 DIAGNOSIS — Z8614 Personal history of Methicillin resistant Staphylococcus aureus infection: Secondary | ICD-10-CM | POA: Diagnosis not present

## 2017-04-27 ENCOUNTER — Telehealth: Payer: Self-pay

## 2017-04-27 NOTE — Telephone Encounter (Signed)
Spoke to pt's husband w/ results. Work-in appt scheduled next Tues to discuss further.

## 2017-04-27 NOTE — Telephone Encounter (Signed)
-----   Message from Melvenia Beam, MD sent at 04/22/2017  8:29 AM EDT ----- Patient's protein was elevated. This can be due to her back problems but is also seen in a disorder called chronic demyelinating polyneuropathy that can cause slowly ascending weakness. Can you ask her to come back to the office and discuss? thanks

## 2017-04-29 ENCOUNTER — Encounter (HOSPITAL_BASED_OUTPATIENT_CLINIC_OR_DEPARTMENT_OTHER): Payer: Medicare Other

## 2017-05-05 ENCOUNTER — Encounter: Payer: Self-pay | Admitting: Neurology

## 2017-05-05 ENCOUNTER — Ambulatory Visit (INDEPENDENT_AMBULATORY_CARE_PROVIDER_SITE_OTHER): Payer: Medicare Other | Admitting: Neurology

## 2017-05-05 VITALS — BP 151/72 | HR 60 | Ht 65.0 in | Wt 170.0 lb

## 2017-05-05 DIAGNOSIS — M6281 Muscle weakness (generalized): Secondary | ICD-10-CM

## 2017-05-05 DIAGNOSIS — M6289 Other specified disorders of muscle: Secondary | ICD-10-CM | POA: Diagnosis not present

## 2017-05-05 DIAGNOSIS — I6523 Occlusion and stenosis of bilateral carotid arteries: Secondary | ICD-10-CM | POA: Diagnosis not present

## 2017-05-05 NOTE — Progress Notes (Signed)
[facility]    Provider:  Dr Jaynee Eagles Referring Provider: [name], MD Primary Care Physician:  [name], MD  [facility]    Provider:  Dr Jaynee Eagles Referring Provider: [name], MD Primary Care Physician:  Lauren Land, MD  CC:  Leg pain and weakness  Interval history 05/05/2017: This is a patient with progressive distal weakness and sensory changes. MRI of the lumbar spine shows severe narrowing of the thecal sac at L4-L5 with severe foraminal narrowing at multiple levels. However this does not appear unchanged as compared to April 2017 lumbar myelography and progressive weakness started later in 2017 and continues to worsen. Lumbar puncture revealed a total protein of 148 which is significantly elevated with unremarkable other CSF values. This can be seen in chronic inflammatory demyelinating polyneuropathy but can also be as a result of her severe spinal stenosis. Discussed with patient and husband that is very unclear. 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 Patient is considering having extensive lumbar spinal surgery I recommend possibly trying a course of IVIG to see if there is any improvement. We'll discuss with Dr. Sandi Mariscal. I had a very long conversation with patient and her husband discussing potential causes of her symptoms.  HPI:  Lauren [name] is a 66 y.o. female here as a referral from Dr. Sandi Mariscal for chronic leg pain and weakness. She has a past medical history of chronic low back pain and lumbar radicular symptoms status post lumbar surgery with a fusion in 2014, peripheral neuropathy, history of chemotherapy in 2009, insulin-dependent diabetes, hypertension, COPD, anemia, bilateral carotid stenosis since 2011 and a right carotid endarterectomy in 2016, endometrial cancer in 2009 and had a recurrence in 2011 which was  treated with radiation, hyperlipidemia, depression, anxiety, insomnia, hypertension, venous insufficiency  She has chronic pain her feet started swelling last year. The pain increased with swelling. The pain is in her feet and toes. Worse in the right foot  But the left is severe as well. She has burning, numbness and tingling in the feet. Swelling worsens the pain. She has been eating a lot of salt. The pain in the feet is what is significantly bothering her now and progressing. The back pain and radicular symptoms are stable bu the foot pain has gotten more severe. She has horrible cramping in the feet she wakes husband up yelling but none recently. Her feet "slump" she has weak ankles. Perioheral neuropathy happened after chemotherapy. Slowly progressive for many years worse in the last year.  She can feel the pain up to her knees. It is very numb. She has foot drop both feet. The foot drop is not associated with the back. In fact pain in the back has improved. She drops things a lot but no similar sensory changes in the hands.At one point her HgbA1c was 9 but recently much better. Foot drop in the last 6 months.   Reviewed notes, labs and imaging from outside physicians, which showed: Patient has chronic leg pain and weakness. Over the last few years she has had lumbar radicular symptoms and had lumbar surgery with a fusion in 2014. This subsequently became infected with MRSA and she required removal of hardware in 2015. In November she had an nerve conduction and EMG which was consistent with a peripheral neuropathy in the legs with a possible element of mild L5 radiculopathy. She has a number of complex medical problems and  is actually followed up a treatment center in [place]. She is being weaned off of Nucynta and OxyContin. Her peripheral neuropathy may very well be from chemotherapy that she had in 2009 possibly exacerbated by her diabetes. I did check a B12 and methylmalonic acid on her several  years ago when repeated in December and those were normal. Her biggest problem recently has been that she has had progressive leg weakness to the point that she is having difficulty walking at all. She is currently on gabapentin. 2 years ago switched her to Lyrica. She thought it was more effective for the burning paresthesias that she has a more severe dull aching leg pain that she experiences have done better on the gabapentin. She is also currently on nortriptyline which is probably giving her some pain relief although she is taking that primarily to help her sleep.  CRP 10.2, TSH 2.4, uric acid normal, CMP showed creatinine 1.15 and BUN 16, hemoglobin A1c 5.8, CBC with anemia 9.9/32.5 these labs were drawn in September 2017. Labs drawn in December 2017 showed a vitamin B12 of greater than 2000 with normal methylmalonic acid, hemoglobin 11 and hematocrit 36.1 with normal MCV. Labs drawn in February 2018 showed a CMP with BUN 16 and creatinine 0.85 otherwise unremarkable and hemoglobin A1c was 6. low ferritin 18 back in September 2017.  NERVE CONDUCTION STUDIES:  Nerve conduction studies were done on the left upper extremity. The distal motor latency for the left median nerve was prolonged with a normal motor amplitude. The F wave latency and nerve conduction velocity for this nerve was normal. The left median sensory latency was prolonged.  Nerve conduction studies were performed on both lower extremities. The studies of the peroneal nerves were unobtainable bilaterally. The distal motor latencies for the posterior tibial nerves were prolonged bilaterally with low motor amplitudes seen for these nerves bilaterally. The nerve conduction velocities for the posterior tibial nerves were normal bilaterally. The H reflex latencies were unobtainable bilaterally. The sural and peroneal sensory latencies were unobtainable bilaterally.  EMG STUDIES: Personally reviewed EMG and nerve conduction study values  and data and agree with the following  EMG study was performed on the leftlower extremity:  The tibialis anterior muscle reveals no voluntarymotor units with norecruitment. 3+fibrillations andpositive waves were seen. The peroneus tertius muscle reveals no voluntarymotor units with no recruitment. 3+fibrillations andpositive waves were seen. The medial gastrocnemius muscle reveals 1 to 3K motor units with markedly decreasedrecruitment.3+fibrillations andpositive waves were seen. The vastus lateralis muscle reveals 2 to 4K motor units with slightly decreasedrecruitment. No fibrillations or positive waves were seen. The iliopsoas muscle reveals 2 to 4K motor units with full recruitment. No fibrillations or positive waves were seen. The biceps femoris muscle (long head) reveals 2 to 3K motor units with slightly decreasedrecruitment.1+fibrillations andpositive waves were seen. The lumbosacral paraspinal muscles were tested at 3 levels, and revealed no abnormalities of insertional activity at all 3 levels tested. There was good relaxation.  EMG study was performed on the rightlower extremity:  The tibialis anterior muscle reveals 1 to 2K motor units with markedly decreasedrecruitment. 3+fibrillations andpositive waves were seen. The peroneus tertius muscle reveals 1 to 2K motor units with markedly decreasedrecruitment. 3+fibrillations andpositive waves were seen. The medial gastrocnemius muscle reveals 1 to 3K motor units with decreasedrecruitment. 2+fibrillations andpositive waves were seen. The vastus lateralis muscle reveals 2 to 5K motor units with decreasedrecruitment. No fibrillations or positive waves were seen. The iliopsoas muscle reveals 2 to 4K motor units  with full recruitment. No fibrillations or positive waves were seen. The biceps femoris muscle (long head) reveals 2 to 3K motor units with slightly decreasedrecruitment. No fibrillations or positive waves  were seen. The lumbosacral paraspinal muscles were not tested, the patient was unable to roll on her left side.   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 There is an overlying mild left carpal tunnel syndrome. EMG evaluation of the left lower extremity shows distal acute and chronic signs of denervation consistent with the diagnosis of peripheral neuropathy, there may be an overlying mild acute L5 radiculopathy. EMG evaluation of the right lower extremity shows distal acute and chronic signs of denervation consistent with a severe peripheral neuropathy. An overlying lumbosacral radiculopathy on this side cannot be confirmed.   Review of Systems: Patient complains of symptoms per HPI as well as the following symptoms: Fatigue, blurred vision, double vision, anemia, swelling in legs, increased thirst, incontinence, diarrhea, cramps, aching muscles, slurred speech, dizziness, insomnia, depression, not enough sleep, decreased energy. Pertinent negatives per HPI. All others negative.   Social History   Social History  . Marital status: Married    Spouse name: N/A  . Number of children: 1  . Years of education: College   Occupational History  . disabled    Social History Main Topics  . Smoking status: Former Smoker    Packs/day: 0.10    Years: 40.00    Types: Cigarettes    Quit date: 01/2017  . Smokeless tobacco: Never Used     Comment: stated smoking 1 cigarette/ day; trying to quit  . Alcohol use No  . Drug use: No  . Sexual activity: No   Other Topics Concern  . Not on file   Social History Narrative   Married, 1 daughter   Disabled   Right-hand   Caffeine: caffeine free sodas    Family History  Problem Relation Age of Onset  . Stroke Mother   . Irritable bowel syndrome Mother   . Heart attack Father 47    died  . Heart disease  Father     Heart Disease before age 81, rheumatic heart disease  . Hyperlipidemia Father   . Hypertension Father   . Diabetes Daughter   . Hypertension Daughter   . Irritable bowel syndrome Daughter   . Colon cancer Neg Hx   . Esophageal cancer Neg Hx   . Pancreatic cancer Neg Hx   . Rectal cancer Neg Hx   . Stomach cancer Neg Hx   . Neuropathy Neg Hx     Past Medical History:  Diagnosis Date  . Anemia   . Anxiety   . CAD (coronary artery disease)   . Carotid artery occlusion   . Cataract    bil cateracts removed  . COPD (chronic obstructive pulmonary disease) ([place])   . Cough   . DDD (degenerative disc disease), lumbar   . Depression   . Diabetes mellitus   . Endometrial ca [facility])    endometrial ca dx 4/11  . GERD (gastroesophageal reflux disease)   . Gout 2015  . Hernia of flank   . Hyperlipidemia   . Hypertension   . Insomnia   . OAB (overactive bladder)   . Osteoarthritis   . Peripheral neuropathy    following chemotherapy  . Psoriasis   . Pulmonary nodule    since 2016  .  Radiation 05/01/2011-06/11/11   5600 cGy 28 fxs/periaortic  . Stroke ([place])    tia  . TIA (transient ischemic attack) 2015  . 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 Uterine cancer ([place])   . Ventral hernia    postoperative since 2009    Past Surgical History:  Procedure Laterality Date  . ABDOMINAL HYSTERECTOMY  2009   for ca  . BACK SURGERY  Aug/sept 2014   3 back surgeries  (Aug 2014 & Sept 2014)  . CHOLECYSTECTOMY     biliteral  . COLONOSCOPY  11/13/2014   negative except for a small cecal AVM and 1 polyp  . ENDARTERECTOMY Right 08/17/2015   Procedure: Right Carotid Endartarectomy with patch Angioplasty ;  Surgeon: Mal Misty, MD;  Location: [facility];  Service: Vascular;  Laterality: Right;  . EYE SURGERY Right Aug. 2013   Cataract, lens implant  . EYE SURGERY Left Sept. 2013   Cataract, lens implant  . I & D of lumbar fusion /removal of hardware for  chronic infection  Jan. 8, 2015  . right carotid endarterectomy  2016  . SPINE SURGERY      Current Outpatient Prescriptions  Medication Sig Dispense Refill  . Albuterol Sulfate (PROAIR RESPICLICK) 696 (90 Base) MCG/ACT AEPB Inhale 2 puffs into the lungs 4 (four) times daily as needed (congestion, cough, wheezing).    Marland Kitchen aspirin EC 81 MG tablet Take 81 mg by mouth daily.    . betamethasone dipropionate (DIPROLENE) 0.05 % ointment Apply 1 application topically 2 (two) times daily as needed (psoriasis).     . chlorthalidone (HYGROTON) 25 MG tablet Take 25 mg by mouth daily.     . Cholecalciferol (VITAMIN D3) 1000 UNITS CAPS Take 1,000 Units by mouth daily.     . Cyanocobalamin (VITAMIN B-12 CR) 1500 MCG TBCR Take 1,500 mcg by mouth daily.     Marland Kitchen doxycycline (VIBRAMYCIN) 100 MG capsule     . escitalopram (LEXAPRO) 20 MG tablet Take 20 mg by mouth daily.    Marland Kitchen esomeprazole (NEXIUM) 40 MG capsule Take 40 mg by mouth daily.     . ferrous sulfate 325 (65 FE) MG tablet Take 325 mg by mouth 2 (two) times daily with a meal.    . Fluticasone-Salmeterol (ADVAIR) 250-50 MCG/DOSE AEPB Inhale 1 puff into the lungs every 12 (twelve) hours.      . folic acid (FOLVITE) 1 MG tablet Take 1 mg by mouth daily.    Marland Kitchen gabapentin (NEURONTIN) 600 MG tablet Take 600 mg by mouth 6 (six) times daily. 6am, 10am, 4pm, 6pm, 10pm, midnight    . glucose blood (ACCU-CHEK AVIVA PLUS) test strip 1 each by Other route as needed. Use as instructed     . hydrochlorothiazide (HYDRODIURIL) 50 MG tablet     . losartan (COZAAR) 100 MG tablet Take 100 mg by mouth daily.      . magnesium oxide (MAG-OX) 400 MG tablet Take 400 mg by mouth daily.    . Melatonin 5 MG TABS Take 10 mg by mouth at bedtime.    . metaxalone (SKELAXIN) 800 MG tablet Take 800 mg by mouth 3 (three) times daily. 6am, noon, 6pm    . metFORMIN (GLUCOPHAGE) 1000 MG tablet Take 1,000 mg by mouth 2 (two) times daily.    . methotrexate (RHEUMATREX) 2.5 MG tablet Take 7.5 mg  by mouth See admin instructions. Take 3 tablets (7.5 mg) every Tuesday morning and night (  6 tablets total)Caution:Chemotherapy. Protect from light.    . metoprolol succinate (TOPROL-XL) 50 MG 24 hr tablet Take 50 mg by mouth daily.    . Multiple Vitamin (MULTIVITAMIN WITH MINERALS) TABS tablet Take 1 tablet by mouth daily.    . nitrofurantoin (MACRODANTIN) 100 MG capsule Take 100 mg by mouth at bedtime.     . nitroGLYCERIN (NITROSTAT) 0.4 MG SL tablet Place 1 tablet (0.4 mg total) under the tongue every 5 (five) minutes as needed for chest pain. 25 tablet 3  . nortriptyline (PAMELOR) 10 MG capsule Take 20 mg by mouth at bedtime.     . ondansetron (ZOFRAN-ODT) 4 MG disintegrating tablet Take 4 mg by mouth every 8 (eight) hours as needed for nausea or vomiting.     Derrill Memo ON 06/06/2017] oxyCODONE (OXYCONTIN) 15 mg 12 hr tablet Take by mouth.    . potassium chloride (KLOR-CON) 10 MEQ CR tablet Take 10 mEq by mouth daily.      . Probiotic Product (PROBIOTIC PO) Take 1 tablet by mouth at bedtime.    . rosuvastatin (CRESTOR) 5 MG tablet Take 5 mg by mouth at bedtime.    . tapentadol (NUCYNTA) 50 MG tablet Take 50 mg by mouth every 6 (six) hours.    . varenicline (CHANTIX) 1 MG tablet Take 1 mg by mouth daily.      Current Facility-Administered Medications  Medication Dose Route Frequency Provider Last Rate Last Dose  . 0.9 %  sodium chloride infusion  500 mL Intravenous Continuous Gatha Mayer, MD        Allergies as of 05/05/2017 - Review Complete 05/05/2017  Allergen Reaction Noted  . Ambien [zolpidem tartrate] Other (See Comments) 11/06/2014  . Sulfa antibiotics Rash 03/13/2011  . Elavil [amitriptyline hcl] Other (See Comments) 10/31/2014  . Prozac [fluoxetine hcl] Anxiety and Other (See Comments) 11/08/2014    Vitals: BP (!) 151/72   Pulse 60   Ht 5\' 5"  (1.651 m)   Wt 170 lb (77.1 kg)   BMI 28.29 kg/m  Last Weight:  Wt Readings from Last 1 Encounters:  05/05/17 170 lb (77.1 kg)     Last Height:   Ht Readings from Last 1 Encounters:  05/05/17 5\' 5"  (1.651 m)    Could not bear weigh even with help, apparently can walk with a walker but could not get up with pour support  Physical exam: Exam: Gen: NAD, conversant, well nourised, obese, well groomed                     CV: RRR, no MRG. No Carotid Bruits. No peripheral edema, warm, nontender Eyes: Conjunctivae clear without exudates or hemorrhage  Neuro: Detailed Neurologic Exam  Speech:    Speech is normal; fluent and spontaneous with normal comprehension.  Cognition:    The patient is oriented to person, place, and time;     recent and remote memory intact;     language fluent;     normal attention, concentration,     fund of knowledge Cranial Nerves:    The pupils are equal, round, and reactive to light. Attempted fundoscopic exam could not visualize. . Visual fields are full to finger confrontation. Extraocular movements are intact. Trigeminal sensation is intact and the muscles of mastication are normal. The face is symmetric. The palate elevates in the midline. Hearing intact. Voice is normal. Shoulder shrug is normal. The tongue has normal motion without fasciculations.   Coordination:    Normal finger to  nose, cannot perform HTS due to weakness  Gait:    Cannot bear weight independently, few steps wide based but cannot fully assess gait due to weakness.  Motor Observation: Mild distal atrophy most pronounced in  FDI of hand and inrinsic foot muscles  and no involuntary movements noted. Tone:     increased LE tone vs paratonia vs pain  Posture:    Posture is normal in wheelchair    Strength: Left deltoid: injury, Weak grip hands, Otherwise 5 to 5- uppers hip flexion 3/5, leg extension flexion 2/5, DF 0 bilat     Sensation:   Decreased pp to elbows and above knees Absent vibration great toes and patella priprioception mostly intact possibly mild decreased     Reflex  Exam:  DTR's:  Brisk biceps Absent lowers    Toes:    The toes are equivocal bilaterally.   Clonus:    Clonus is absent.       Assessment/Plan:  Very nice 66 year old female here with her husband for evaluation of a progressive length-dependent, severe, axonal, sensorimotor neuropathy.  MRI showed severe lumbar stenosis which may be the cause and she will be referred to [facility]. Axonal sensorimotor polyneuropathies carry an extensive list of differential diagnoses. Causes may be metabolic, toxic, nutritional, iatrogenic, infectious, immune mediated, vascular, neoplastic, paraneoplastic and genetic. Diabetes mellitus and prediabetes are the most common causes of neuropathy.  Patient does have a history of uncontrolled diabetes. Also patient reports her diabetes last year was uncontrolled and has only recently come down to normal hgba1c which raises the question of treatment induced neuropathy of diabetes where neuropathy may occur within weeks of rapid glycemic control. Patient also has a history of neurotoxic medications and chemotherapy. No alcohol overuse. Need to check for other causes including monoclonal gammopathy, vitamin deficiency. Also recommend lumbar puncture to evaluate for axonal form of CIDP. Axonal neuropathies can happen in systemic illnesses including alcoholic polyneuropathy, uremic neuropathy, amyloid neuropathy, porphyria. Vitamin deficiencies such as B12 and thiamine can cause acute or subacute progressive polyneuropathy. Chemotherapy is the leading cause of medication-induced neuropathies and diabetes is the most common predisposing factor for the development of chemotherapy-induced polyneuropathy.  -May consider daily alpha lipoic acid which is an antioxidant that may reduce free radical oxidative stress associated with diabetic polyneuropathy, existing evidence suggests that alpha lipoic acid significantly reduces stabbing, lancinating and burning pain and diabetic  neuropathy with its onset of action as early as 1-2 weeks.  - needs NSY consult for severe L4/L5 spinal stenosis  (L4-5: There is severe narrowing of the thecal sac at this level. The patient is status post posterior decompression. Severe bilateralforaminal narrowing is present. The appearance is unchanged compared to the prior CT myelogram.)  L5-S1: Status post posterior to the the thecal sac appears decompressed. Marked bilateral foraminal narrowing is present.)  - Will order an extensive panel of blood tests as per above and if no etiology is found consider lumbar puncture  -  Lumbar puncture revealed a total protein of 148 which is significantly elevated with unremarkable other CSF values. This can be seen in chronic inflammatory demyelinating polyneuropathy but can also be as a result of her severe spinal stenosis. Discussed with patient and husband that is very unclear. 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 Patient is considering having extensive lumbar spinal surgery I recommend possibly trying a course of IVIG to see if there is  any improvement.  MRI 01/2017:  MRI lumbar spine: L4-5: There is severe narrowing of the thecal sac at this level. The patient is status post posterior decompression. Severe bilateral foraminal narrowing is present. The appearance is unchanged compared to the prior CT myelogram(2017)  L5-S1: Status post posterior to the the thecal sac appears decompressed. Marked bilateral foraminal narrowing is present. Sarina Ill, MD  [facility] [address] [place], [place] [address]  Phone [phone] Fax [phone]  A total of 45 minutes was spent face-to-face with this patient. Over half this time was spent on counseling patient on the cidp vs spinal stenosis diagnosis and different diagnostic and therapeutic options available.

## 2017-05-12 ENCOUNTER — Other Ambulatory Visit: Payer: Self-pay | Admitting: Family Medicine

## 2017-05-12 DIAGNOSIS — R911 Solitary pulmonary nodule: Secondary | ICD-10-CM

## 2017-05-13 ENCOUNTER — Encounter: Payer: Self-pay | Admitting: Gynecology

## 2017-05-21 ENCOUNTER — Ambulatory Visit (INDEPENDENT_AMBULATORY_CARE_PROVIDER_SITE_OTHER): Payer: Medicare Other | Admitting: Family

## 2017-05-21 ENCOUNTER — Encounter (INDEPENDENT_AMBULATORY_CARE_PROVIDER_SITE_OTHER): Payer: Self-pay | Admitting: Family

## 2017-05-21 ENCOUNTER — Telehealth: Payer: Self-pay | Admitting: Neurology

## 2017-05-21 VITALS — Ht 65.0 in | Wt 170.0 lb

## 2017-05-21 DIAGNOSIS — M7502 Adhesive capsulitis of left shoulder: Secondary | ICD-10-CM

## 2017-05-21 DIAGNOSIS — M67912 Unspecified disorder of synovium and tendon, left shoulder: Secondary | ICD-10-CM

## 2017-05-21 DIAGNOSIS — M25512 Pain in left shoulder: Principal | ICD-10-CM

## 2017-05-21 DIAGNOSIS — G8929 Other chronic pain: Secondary | ICD-10-CM

## 2017-05-21 NOTE — Telephone Encounter (Signed)
Lauren [name], would you please call patient and let her know I talked to Dr. Sandi Mariscal and he agrees that a trial of IVIG is warranted. I would like her to come into the office every 3 weeks and have them completed. If she agrees, please Place orders for the IVIG. Patient has diabetes I would like to use a product contains the least amount of glucose. Please let me know thank you.

## 2017-05-21 NOTE — Telephone Encounter (Signed)
Called and spoke to pt and her husband. Questions were answered about IvIg including scheduling, side effects and insurance coverage. They are agreeable to proceeding w/ treatment. IvIg physician order sheet completed.

## 2017-05-22 DIAGNOSIS — M67912 Unspecified disorder of synovium and tendon, left shoulder: Secondary | ICD-10-CM | POA: Diagnosis not present

## 2017-05-22 MED ORDER — LIDOCAINE HCL 1 % IJ SOLN
5.0000 mL | INTRAMUSCULAR | Status: AC | PRN
  Administered 2017-05-22: 5 mL

## 2017-05-22 MED ORDER — METHYLPREDNISOLONE ACETATE 40 MG/ML IJ SUSP
40.0000 mg | INTRAMUSCULAR | Status: AC | PRN
  Administered 2017-05-22: 40 mg via INTRA_ARTICULAR

## 2017-05-22 NOTE — Progress Notes (Signed)
Office Visit Note   Patient: Lauren [name]           Date of Birth: [birth year]           MRN: [id number] Visit Date: 05/21/2017              Requested by: [name], MD [address] [place], [place] [address] PCP: [name], MD  Chief Complaint  Patient presents with  . Left Shoulder - Pain      HPI: The patient is a 66 year old woman who presents today complaining of left shoulder and upper arm pain. This is been ongoing for many more months however states has been increasingly painful over the last 4 weeks. Several months ago had a fall in her arm was pulled away from her body. Now states her "arm is useless". She feels most comfortable one holding her arm close to her chest or wearing a sling for comfort. States has had relief with cortisone injections. Last injection about 4 months ago.  complains of loss of range of motion is unable to lift her arm above head.  Assessment & Plan: Visit Diagnoses:  1. Chronic left shoulder pain     Plan: Injection today. Discussed arthroscopy for frozen shoulder. Patient declined today would like to trial injection for pain control. Follow up in 4 weeks.   Follow-Up Instructions: Return in about 4 weeks (around 06/18/2017).   Left Shoulder Exam   Tenderness  The patient is experiencing tenderness in the biceps tendon.  Range of Motion  Active Abduction: 80  Passive Abduction: 90  External Rotation: 70   Tests  Impingement: positive  Comments:  Difficult exam      Patient is alert, oriented, no adenopathy, well-dressed, normal affect, normal respiratory effort.   Imaging: No results found.  Labs: Lab Results  Component Value Date   HGBA1C 6.9 (H) 08/17/2015   HGBA1C 7.0 (H) 08/14/2015   ESRSEDRATE 36 03/10/2017   REPTSTATUS 02/05/2017 FINAL 02/02/2017   GRAMSTAIN No WBC Seen 04/16/2017   GRAMSTAIN No Organisms Seen 04/16/2017   GRAMSTAIN CYTOSPIN 04/16/2017   CULT (A) 02/02/2017   STAPHYLOCOCCUS SPECIES (COAGULASE NEGATIVE) THE SIGNIFICANCE OF ISOLATING THIS ORGANISM FROM A SINGLE SET OF BLOOD CULTURES WHEN MULTIPLE SETS ARE DRAWN IS UNCERTAIN. PLEASE NOTIFY THE MICROBIOLOGY DEPARTMENT WITHIN ONE WEEK IF SPECIATION AND SENSITIVITIES ARE REQUIRED.    LABORGA NO GROWTH 04/16/2017    Orders:  No orders of the defined types were placed in this encounter.  No orders of the defined types were placed in this encounter.    Procedures: Large Joint Inj Date/Time: 05/22/2017 10:58 AM Performed by: Suzan Slick Authorized by: Dondra Prader R   Consent Given by:  Patient Site marked: the procedure site was marked   Timeout: prior to procedure the correct patient, procedure, and site was verified   Indications:  Pain and diagnostic evaluation Location:  Shoulder Site:  L subacromial bursa Prep: patient was prepped and draped in usual sterile fashion   Needle Size:  22 G Needle Length:  1.5 inches Ultrasound Guidance: No   Fluoroscopic Guidance: No   Arthrogram: No   Medications:  5 mL lidocaine 1 %; 40 mg methylPREDNISolone acetate 40 MG/ML Aspiration Attempted: No   Patient tolerance:  Patient tolerated the procedure well with no immediate complications    Clinical Data: No additional findings.  ROS:  All other systems negative, except as noted in the HPI. Review of Systems  Constitutional: Negative for chills  and fever.  Musculoskeletal: Positive for arthralgias and myalgias.    Objective: Vital Signs: Ht 5\' 5"  (1.651 m)   Wt 170 lb (77.1 kg)   BMI 28.29 kg/m   Specialty Comments:  No specialty comments available.  PMFS History: Patient Active Problem List   Diagnosis Date Noted  . 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 Pressure ulcer of left heel, stage 2 02/04/2017  . Altered mental status   . Community acquired pneumonia   . Encephalopathy   . Fall   . Weakness of both lower extremities   . Other chronic pain   . Narcotic  dependence ([place])   . Incontinence of feces   . Sepsis ([place]) 02/02/2017  . Bilateral carotid artery stenosis 01/30/2017  . Aftercare following surgery of the circulatory system 01/30/2017  . Impingement syndrome of left shoulder 12/17/2016  . Coronary artery calcification of native artery 10/07/2016  . Iron (Fe) deficiency anemia 10/07/2016  . DDD (degenerative disc disease), lumbar 10/03/2016  . Fatigue 10/03/2016  . Intervertebral disc disease 10/03/2016  . Low back pain 10/03/2016  . Myelopathy ([place]) 10/03/2016  . Prolapsed lumbosacral intervertebral disc 10/03/2016  . Spondylisthesis 10/03/2016  . MRSA (methicillin resistant staph aureus) culture positive 10/10/2015  . Carotid stenosis 08/17/2015  . Preoperative cardiovascular examination 08/10/2015  . Tobacco abuse 08/10/2015  . 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 Acute osteomyelitis of spine ([place]) 08/28/2014  . Difficult or painful urination 08/28/2014  . H/O Spinal surgery 08/28/2014  . Elevated WBC count 08/28/2014  . Encounter for aftercare for long-term (current) use of antibiotics 08/28/2014  . N&V (nausea and vomiting) 08/28/2014  . Bladder retention 08/28/2014  . Infection of urinary tract 08/28/2014  . DM2 (diabetes mellitus, type 2) ([place]) 06/15/2013  . Ventral hernia 06/15/2013  . COPD (chronic obstructive pulmonary disease) ([place]) 06/15/2013  . DJD (degenerative joint disease) of lumbar spine 06/15/2013  . Depression 06/15/2013  . Exertional angina ([place]) 06/15/2013  . Preoperative evaluation 06/15/2013  . HTN (hypertension) 06/15/2013  . Occlusion and stenosis of carotid artery without mention of cerebral infarction 03/02/2012  . Malignant neoplasm of corpus uteri, except isthmus ([place]) 01/19/2012  . Abnormal EKG 12/07/2011  . Essential hypertension, benign 12/07/2011  . Hyperlipidemia with low HDL 12/07/2011  . Carotid artery disease without cerebral infarction ([place]) 12/07/2011   Past Medical History:    Diagnosis Date  . Anemia   . Anxiety   . CAD (coronary artery disease)   . Carotid artery occlusion   . Cataract    bil cateracts removed  . COPD (chronic obstructive pulmonary disease) ([place])   . Cough   . DDD (degenerative disc disease), lumbar   . Depression   . Diabetes mellitus   . Endometrial ca [facility])    endometrial ca dx 4/11  . GERD (gastroesophageal reflux disease)   . Gout 2015  . Hernia of flank   . Hyperlipidemia   . Hypertension   . Insomnia   . OAB (overactive bladder)   . Osteoarthritis   . Peripheral neuropathy    following chemotherapy  . Psoriasis   . Pulmonary nodule    since 2016  . Radiation 05/01/2011-06/11/11   5600 cGy 28 fxs/periaortic  . Stroke ([place])    tia  . TIA (transient ischemic attack) 2015  . 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 Uterine cancer ([place])   . Ventral hernia    postoperative since 2009    Family History  Problem Relation Age of Onset  . Stroke Mother   . Irritable bowel syndrome Mother   . Heart attack Father 7       died  . Heart disease Father        Heart Disease before age 54, rheumatic heart disease  . Hyperlipidemia Father   . Hypertension Father   . Diabetes Daughter   . Hypertension Daughter   . Irritable bowel syndrome Daughter   . Colon cancer Neg Hx   . Esophageal cancer Neg Hx   . Pancreatic cancer Neg Hx   . Rectal cancer Neg Hx   . Stomach cancer Neg Hx   . Neuropathy Neg Hx     Past Surgical History:  Procedure Laterality Date  . ABDOMINAL HYSTERECTOMY  2009   for ca  . BACK SURGERY  Aug/sept 2014   3 back surgeries  (Aug 2014 & Sept 2014)  . CHOLECYSTECTOMY     biliteral  . COLONOSCOPY  11/13/2014   negative except for a small cecal AVM and 1 polyp  . ENDARTERECTOMY Right 08/17/2015   Procedure: Right Carotid Endartarectomy with patch Angioplasty ;  Surgeon: Mal Misty, MD;  Location: [facility];  Service: Vascular;  Laterality: Right;  . EYE SURGERY Right Aug.  2013   Cataract, lens implant  . EYE SURGERY Left Sept. 2013   Cataract, lens implant  . I & D of lumbar fusion /removal of hardware for chronic infection  Jan. 8, 2015  . right carotid endarterectomy  2016  . SPINE SURGERY     Social History   Occupational History  . disabled    Social History Main Topics  . Smoking status: Former Smoker    Packs/day: 0.10    Years: 40.00    Types: Cigarettes    Quit date: 01/2017  . Smokeless tobacco: Never Used     Comment: stated smoking 1 cigarette/ day; trying to quit  . Alcohol use No  . Drug use: No  . Sexual activity: No

## 2017-05-27 NOTE — Telephone Encounter (Signed)
IvIg orders signed and given, along w/ demographics, insurance info and OV notes, to [employer] w/ [facility].

## 2017-06-10 ENCOUNTER — Encounter: Payer: Self-pay | Admitting: Podiatry

## 2017-06-10 ENCOUNTER — Ambulatory Visit (INDEPENDENT_AMBULATORY_CARE_PROVIDER_SITE_OTHER): Payer: Medicare Other | Admitting: Podiatry

## 2017-06-10 VITALS — BP 108/78 | HR 60 | Temp 97.6°F

## 2017-06-10 DIAGNOSIS — E1151 Type 2 diabetes mellitus with diabetic peripheral angiopathy without gangrene: Secondary | ICD-10-CM

## 2017-06-10 DIAGNOSIS — I6523 Occlusion and stenosis of bilateral carotid arteries: Secondary | ICD-10-CM

## 2017-06-10 DIAGNOSIS — L84 Corns and callosities: Secondary | ICD-10-CM

## 2017-06-10 DIAGNOSIS — E1142 Type 2 diabetes mellitus with diabetic polyneuropathy: Secondary | ICD-10-CM

## 2017-06-10 NOTE — Progress Notes (Signed)
   Subjective:    Patient ID: Lauren [name], female    DOB: 05-07-1951, 66 y.o.   MRN: [id number]  HPI This patient presents today complaining of a proximally 6 month history of a painful skin lesion on the plantar aspect of the second right toe. The toe is quite uncomfortable when she puts pressure and attempts to walk or transfer from her wheelchair. She visited the wound care center and the plantar skin lesion was debrided probably 5 weeks ago which provided temporary relief and patient was referred to our office for follow-up care.  Patient has complex medical history including diabetes Axonal sensorimotor neuropathy Weakness in the lower extremities  Patient patient denies history of ulceration, claudication or amputation Patient has a history of pain, numbness burning in her feet Patient has long history of smoking Lauren [name] until 6 months ago. Patient denies smoking for the past 6 months  Patient husband is present in the treatment room today  Review of Systems  Genitourinary: Positive for decreased urine volume and urgency.  Musculoskeletal: Positive for back pain, gait problem and myalgias.       Objective:   Physical Exam  Orientated 3  Patient transfers from wheelchair to treatment chair  Vascular: Bilateral peripheral pitting edema DP pulses trace palpable bilaterally PT pulses nonpalpable bilaterally  Neurological: Sensation to 10 g monofilament wire intact 6/9 bilaterally Vibratory sensation reactive right nonreactive left Ankle reflex weakly reactive bilaterally  Dermatological: No open skin lesions bilaterally Atrophic, shiny skin bilaterally Absent hair growth bilaterally Well-organized large plantar corn sub-second right toe with dried blood within the Corn  Musculoskeletal: Hammertoe second right Rigid rear foot varus deformity left Dorsi flexion 0/4 bilaterally Plantar flexion 4/5 bilaterally       Assessment & Plan:     Assessment: Diabetic peripheral arterial disease Diabetic neuropathy Sensorimotor neuropathy Pre-ulcerative corn plantar second right toe  Plan: I informed patient and patient's husband that there was a chronic corn on the plantar aspect of the right toe and I recommended periodic debridement. Patient is not candidate for surgical reconstruction. Patient consents The plantar corn was debrided without a bleeding A silicone digital pad was dispensed  Return as needed for debridement

## 2017-06-10 NOTE — Patient Instructions (Signed)
Today year exam demonstrated decrease circulation and feeling in your feet. There is a chronic corn-like growth which is pre-ulcerative on the bottom of the second right toe. I recommend periodic trimming of this corn for relief. Also, dispensed a gel pad to wear on the second right toe Return as needed  Diabetes and Foot Care Diabetes may cause you to have problems because of poor blood supply (circulation) to your feet and legs. This may cause the skin on your feet to become thinner, break easier, and heal more slowly. Your skin may become dry, and the skin may peel and crack. You may also have nerve damage in your legs and feet causing decreased feeling in them. You may not notice minor injuries to your feet that could lead to infections or more serious problems. Taking care of your feet is one of the most important things you can do for yourself. Follow these instructions at home:  Wear shoes at all times, even in the house. Do not go barefoot. Bare feet are easily injured.  Check your feet daily for blisters, cuts, and redness. If you cannot see the bottom of your feet, use a mirror or ask someone for help.  Wash your feet with warm water (do not use hot water) and mild soap. Then pat your feet and the areas between your toes until they are completely dry. Do not soak your feet as this can dry your skin.  Apply a moisturizing lotion or petroleum jelly (that does not contain alcohol and is unscented) to the skin on your feet and to dry, brittle toenails. Do not apply lotion between your toes.  Trim your toenails straight across. Do not dig under them or around the cuticle. File the edges of your nails with an emery board or nail file.  Do not cut corns or calluses or try to remove them with medicine.  Wear clean socks or stockings every day. Make sure they are not too tight. Do not wear knee-high stockings since they may decrease blood flow to your legs.  Wear shoes that fit properly and  have enough cushioning. To break in new shoes, wear them for just a few hours a day. This prevents you from injuring your feet. Always look in your shoes before you put them on to be sure there are no objects inside.  Do not cross your legs. This may decrease the blood flow to your feet.  If you find a minor scrape, cut, or break in the skin on your feet, keep it and the skin around it clean and dry. These areas may be cleansed with mild soap and water. Do not cleanse the area with peroxide, alcohol, or iodine.  When you remove an adhesive bandage, be sure not to damage the skin around it.  If you have a wound, look at it several times a day to make sure it is healing.  Do not use heating pads or hot water bottles. They may burn your skin. If you have lost feeling in your feet or legs, you may not know it is happening until it is too late.  Make sure your health care provider performs a complete foot exam at least annually or more often if you have foot problems. Report any cuts, sores, or bruises to your health care provider immediately. Contact a health care provider if:  You have an injury that is not healing.  You have cuts or breaks in the skin.  You have an ingrown nail.  You notice redness on your legs or feet.  You feel burning or tingling in your legs or feet.  You have pain or cramps in your legs and feet.  Your legs or feet are numb.  Your feet always feel cold. Get help right away if:  There is increasing redness, swelling, or pain in or around a wound.  There is a red line that goes up your leg.  Pus is coming from a wound.  You develop a fever or as directed by your health care provider.  You notice a bad smell coming from an ulcer or wound. This information is not intended to replace advice given to you by your health care provider. Make sure you discuss any questions you have with your health care provider. Document Released: 12/12/2000 Document Revised:  05/22/2016 Document Reviewed: 05/24/2013 Elsevier Interactive Patient Education  2017 [employer].

## 2017-06-11 ENCOUNTER — Ambulatory Visit
Admission: RE | Admit: 2017-06-11 | Discharge: 2017-06-11 | Disposition: A | Discharge status: Home / Self Care | Payer: Medicare Other | Source: Ambulatory Visit | Attending: Family Medicine | Admitting: Family Medicine

## 2017-06-11 DIAGNOSIS — R911 Solitary pulmonary nodule: Secondary | ICD-10-CM

## 2017-06-16 ENCOUNTER — Other Ambulatory Visit: Payer: Self-pay | Admitting: Family Medicine

## 2017-06-16 ENCOUNTER — Telehealth: Payer: Self-pay | Admitting: Neurology

## 2017-06-16 DIAGNOSIS — Z1231 Encounter for screening mammogram for malignant neoplasm of breast: Secondary | ICD-10-CM

## 2017-06-16 NOTE — Telephone Encounter (Signed)
Patients husband called office requesting to speak with Dr. Jaynee Eagles or her nurse in reference to scheduling I believe IVIG.  Per husband he has not heard if insurance is going to cover services or when patient is going to be scheduled.  Please call

## 2017-06-16 NOTE — Telephone Encounter (Signed)
Additional copy of IvIg and insurance info given to [facility]. Can also discuss w/ infusion nurse when she returns to office tomorrow.

## 2017-06-16 NOTE — Telephone Encounter (Signed)
Error

## 2017-06-25 ENCOUNTER — Ambulatory Visit
Admission: RE | Admit: 2017-06-25 | Discharge: 2017-06-25 | Disposition: A | Discharge status: Home / Self Care | Payer: Medicare Other | Source: Ambulatory Visit | Attending: Family Medicine | Admitting: Family Medicine

## 2017-06-25 DIAGNOSIS — Z1231 Encounter for screening mammogram for malignant neoplasm of breast: Secondary | ICD-10-CM

## 2017-07-08 ENCOUNTER — Other Ambulatory Visit: Payer: Self-pay | Admitting: Family Medicine

## 2017-07-08 DIAGNOSIS — M25552 Pain in left hip: Secondary | ICD-10-CM

## 2017-07-17 ENCOUNTER — Encounter: Payer: Self-pay | Admitting: Neurology

## 2017-07-17 ENCOUNTER — Ambulatory Visit
Admission: RE | Admit: 2017-07-17 | Discharge: 2017-07-17 | Disposition: A | Discharge status: Home / Self Care | Payer: Medicare Other | Source: Ambulatory Visit | Attending: Family Medicine | Admitting: Family Medicine

## 2017-07-17 DIAGNOSIS — M25552 Pain in left hip: Secondary | ICD-10-CM

## 2017-07-17 MED ORDER — IOPAMIDOL (ISOVUE-M 200) INJECTION 41%
1.0000 mL | Freq: Once | INTRAMUSCULAR | Status: AC
  Administered 2017-07-17: 1 mL via INTRA_ARTICULAR

## 2017-07-17 MED ORDER — METHYLPREDNISOLONE ACETATE 40 MG/ML INJ SUSP (RADIOLOG
120.0000 mg | Freq: Once | INTRAMUSCULAR | Status: AC
  Administered 2017-07-17: 120 mg via INTRA_ARTICULAR

## 2017-07-20 ENCOUNTER — Other Ambulatory Visit: Payer: Medicare Other

## 2017-07-22 ENCOUNTER — Other Ambulatory Visit: Payer: Self-pay | Admitting: Family Medicine

## 2017-07-22 ENCOUNTER — Ambulatory Visit
Admission: RE | Admit: 2017-07-22 | Discharge: 2017-07-22 | Disposition: A | Discharge status: Home / Self Care | Payer: Medicare Other | Source: Ambulatory Visit | Attending: Family Medicine | Admitting: Family Medicine

## 2017-07-22 DIAGNOSIS — M7989 Other specified soft tissue disorders: Secondary | ICD-10-CM

## 2017-07-23 ENCOUNTER — Other Ambulatory Visit: Payer: Self-pay | Admitting: Family Medicine

## 2017-07-23 DIAGNOSIS — R748 Abnormal levels of other serum enzymes: Secondary | ICD-10-CM

## 2017-07-29 DIAGNOSIS — J189 Pneumonia, unspecified organism: Secondary | ICD-10-CM

## 2017-07-29 HISTORY — DX: Pneumonia, unspecified organism: J18.9

## 2017-07-30 ENCOUNTER — Ambulatory Visit (INDEPENDENT_AMBULATORY_CARE_PROVIDER_SITE_OTHER): Payer: Medicare Other | Admitting: Internal Medicine

## 2017-07-30 ENCOUNTER — Encounter: Payer: Self-pay | Admitting: Internal Medicine

## 2017-07-30 VITALS — BP 130/60 | HR 60 | Ht 66.5 in | Wt 200.0 lb

## 2017-07-30 DIAGNOSIS — I6523 Occlusion and stenosis of bilateral carotid arteries: Secondary | ICD-10-CM | POA: Diagnosis not present

## 2017-07-30 DIAGNOSIS — R918 Other nonspecific abnormal finding of lung field: Secondary | ICD-10-CM | POA: Diagnosis not present

## 2017-07-30 DIAGNOSIS — J449 Chronic obstructive pulmonary disease, unspecified: Secondary | ICD-10-CM | POA: Diagnosis not present

## 2017-07-30 NOTE — Progress Notes (Signed)
Subjective:     Patient ID: Lauren [name], female   DOB: 05/23/1951,    MRN: [id number]  HPI  25 yowf  Chronic back problems/ very little ambulation since 2015/16 quit smoking 01/2017 p admit:  Date of Admission: 02/02/2017                        Date of Discharge: 02/06/17  Indication for Hospitalization: sepsis secondary to CAP  Discharge Diagnoses/Problem List:      Patient Active Problem List   Diagnosis Date Noted  . Pressure ulcer of left heel, stage 2 02/04/2017  . Altered mental status   . Community acquired pneumonia   . Encephalopathy   . Fall   . Weakness of both lower extremities   . Other chronic pain   . Narcotic dependence ([place])   . Incontinence of feces   . Sepsis ([place]) 02/02/2017  . Bilateral carotid artery stenosis 01/30/2017  . Aftercare following surgery of the circulatory system 01/30/2017  . Impingement syndrome of left shoulder 12/17/2016  . Coronary artery calcification of native artery 10/07/2016  . Iron (Fe) deficiency anemia 10/07/2016  . DDD (degenerative disc disease), lumbar 10/03/2016  . Fatigue 10/03/2016  . Intervertebral disc disease 10/03/2016  . Low back pain 10/03/2016  . Myelopathy ([place]) 10/03/2016  . Prolapsed lumbosacral intervertebral disc 10/03/2016  . Spondylisthesis 10/03/2016  . MRSA (methicillin resistant staph aureus) culture positive 10/10/2015  . Carotid stenosis 08/17/2015  . Preoperative cardiovascular examination 08/10/2015  . Tobacco abuse 08/10/2015  . 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 Acute osteomyelitis of spine ([place]) 08/28/2014  . Difficult or painful urination 08/28/2014  . H/O Spinal surgery 08/28/2014  . Elevated WBC count 08/28/2014  . Encounter for aftercare for long-term (current) use of antibiotics 08/28/2014  . N&V (nausea and vomiting) 08/28/2014  . Bladder retention 08/28/2014  . Infection of urinary tract 08/28/2014  . DM2 (diabetes mellitus, type 2) ([place]) 06/15/2013  . Ventral hernia  06/15/2013  . COPD (chronic obstructive pulmonary disease) ([place]) 06/15/2013  . DJD (degenerative joint disease) of lumbar spine 06/15/2013  . Depression 06/15/2013  . Exertional angina ([place]) 06/15/2013  . Preoperative evaluation 06/15/2013  . HTN (hypertension) 06/15/2013  . Occlusion and stenosis of carotid artery without mention of cerebral infarction 03/02/2012  . Malignant neoplasm of corpus uteri, except isthmus ([place]) 01/19/2012  . Abnormal EKG 12/07/2011  . Essential hypertension, benign 12/07/2011  . Hyperlipidemia with low HDL 12/07/2011  . Carotid artery disease without cerebral infarction [facility]) 12/07/2011           07/30/2017 1st [facility] office visit/ Bren Steers   Chief Complaint  Patient presents with  . Pulmonary Consult    Referred by Dr. [name] for eval of pulmonary nodule. Pt is unsure why cxr was ordered. She denies any respiratory co's.   since admit has not regained her strength and uses w/c to go out now  Also RA/ also on macrodantin  Sleeps in recliner due to back maybe 30 degrees  Takes advair 250 "prn" sob   No obvious day to day or daytime variability or assoc excess/ purulent sputum or mucus plugs or hemoptysis or cp or chest tightness, subjective wheeze or overt sinus or hb symptoms. No unusual exp hx or h/o childhood pna/ asthma or knowledge of premature birth.  Sleeping ok without nocturnal  or early am exacerbation  of respiratory  c/o's or need for noct saba. Also denies any obvious fluctuation of  symptoms with weather or environmental changes or other aggravating or alleviating factors except as outlined above   Current Medications, Allergies, Complete Past Medical History, Past Surgical History, Family History, and Social History were reviewed in [employer] record.  ROS  The following are not active complaints unless bolded sore throat, dysphagia, dental problems, itching, sneezing,  nasal congestion or excess/  purulent secretions, ear ache,   fever, chills, sweats, unintended wt loss, classically pleuritic or exertional cp,  orthopnea pnd or leg swelling, presyncope, palpitations, abdominal pain, anorexia, nausea, vomiting, diarrhea  or change in bowel or bladder habits, change in stools or urine, dysuria,hematuria,  rash, arthralgias, visual complaints, headache, numbness, weakness or ataxia or problems with walking or coordination,  change in mood/affect or memory.              Review of Systems     Objective:   Physical Exam     Jovial moderately hoarse w/c bound wf nad  Wt Readings from Last 3 Encounters:  07/30/17 200 lb (90.7 kg)  05/21/17 170 lb (77.1 kg)  05/05/17 170 lb (77.1 kg)    Vital signs reviewed  - Note on arrival 02 sats  99% on RA    HEENT: nl dentition, turbinates bilaterally, and oropharynx. Nl external ear canals without cough reflex   NECK :  without JVD/Nodes/TM/ nl carotid upstrokes bilaterally   LUNGS: no acc muscle use, barrel contour chest with distant bs bilaterally, min scattered late exp rhonchi   CV:  RRR  no s3 or murmur or increase in P2, and  1+ pitting bilateral sym  edema   ABD:  soft and nontender with nl inspiratory excursion in the supine position. No bruits or organomegaly appreciated, bowel sounds nl  MS:  Nl gait/ ext warm without deformities, calf tenderness, cyanosis or clubbing No obvious joint restrictions   SKIN: warm and dry without lesions    NEURO:  alert, approp, nl sensorium with  no motor or cerebellar deficits apparent.       I personally reviewed images and agree with radiology impression as follows:  CXR:   07/22/17 No acute cardiopulmonary disease.         Assessment:

## 2017-07-30 NOTE — Patient Instructions (Addendum)
If breathing worsens resume the advair 250 twice daily   Please schedule a follow up visit in 3  months but call sooner if needed  With cxr on return

## 2017-07-31 DIAGNOSIS — R918 Other nonspecific abnormal finding of lung field: Secondary | ICD-10-CM | POA: Insufficient documentation

## 2017-07-31 NOTE — Assessment & Plan Note (Addendum)
First detected 07/25/14 = 71mm -  CT  Chest 06/11/17 :  8 x 6 LLL  She actually has MPN's and GG changes that wax and wane and are likely inflammatory but the LLL is  a very slowly growing peripheral nodule that may someday cause her symptoms but given the severity of her copd and the multiple comorbidities it's very difficult to know what to offer her at this point, as even now it cannot be seen on a plain cxr which was reviewed in detail today.  She also has RA that increases the likelihood of a benign nodule though in this former smoker would still favor a neoplasm of some type.    Discussed in detail all the  indications, usual  risks and alternatives  relative to the benefits with patient/husband who agree  to proceed with conservative f/u in 3 months and reconsider options then as she is in no shape now to consider any form of bx  Total time devoted to counseling  > 50 % of initial 60 min office visit:  review case with pt/ discussion of options/alternatives/ personally creating written customized instructions  in presence of pt  then going over those specific  Instructions directly with the pt including how to use all of the meds but in particular covering each new medication in detail and the difference between the maintenance= "automatic" meds and the prns using an action plan format for the latter (If this problem/symptom => do that organization reading Left to right).  Please see AVS from this visit for a full list of these instructions which I personally wrote for this pt and  are unique to this visit.

## 2017-07-31 NOTE — Assessment & Plan Note (Signed)
Spirometry 07/30/2017  FEV1 0.79 (30%)  Ratio 53 s rx prior/ classic curvature    As I explained to this patient in detail:  although there is clearly very severe  copd present, it may not be clinically relevant:   it does not appear to be limiting activity tolerance any more than a set of worn tires limits someone from driving a car  around a parking lot.  A new set of Michelins might look good but would have no perceived impact on the performance of the car and would not be worth the cost.  That is to say:   this pt is so sedentary I don't recommend aggressive pulmonary rx at this point unless limiting symptoms arise or acute exacerbations become as issue, neither of which is the case now.  If either apply, then can restart advair 250 bid on a more regular (maintenance) basis

## 2017-08-04 ENCOUNTER — Telehealth: Payer: Self-pay | Admitting: Cardiovascular Disease

## 2017-08-04 ENCOUNTER — Ambulatory Visit
Admission: RE | Admit: 2017-08-04 | Discharge: 2017-08-04 | Disposition: A | Discharge status: Home / Self Care | Payer: Medicare Other | Source: Ambulatory Visit | Attending: Family Medicine | Admitting: Family Medicine

## 2017-08-04 ENCOUNTER — Other Ambulatory Visit: Payer: Self-pay | Admitting: Surgery

## 2017-08-04 DIAGNOSIS — E041 Nontoxic single thyroid nodule: Secondary | ICD-10-CM

## 2017-08-04 DIAGNOSIS — R748 Abnormal levels of other serum enzymes: Secondary | ICD-10-CM

## 2017-08-04 MED ORDER — IOPAMIDOL (ISOVUE-300) INJECTION 61%
75.0000 mL | Freq: Once | INTRAVENOUS | Status: AC | PRN
  Administered 2017-08-04: 75 mL via INTRAVENOUS

## 2017-08-04 NOTE — Telephone Encounter (Signed)
08/04/17-Received incoming records from Dr. Doretha Sou Blomgren's office for upcoming appointment on 08/14/17 @ 3:20pm with Dr. Sallyanne Kuster. Records given to [facility] in medical records. ab

## 2017-08-11 ENCOUNTER — Other Ambulatory Visit: Payer: Self-pay | Admitting: Urology

## 2017-08-12 ENCOUNTER — Encounter: Payer: Self-pay | Admitting: Gastroenterology

## 2017-08-13 ENCOUNTER — Encounter: Payer: Self-pay | Admitting: Internal Medicine

## 2017-08-13 ENCOUNTER — Other Ambulatory Visit (INDEPENDENT_AMBULATORY_CARE_PROVIDER_SITE_OTHER): Payer: Medicare Other

## 2017-08-13 ENCOUNTER — Ambulatory Visit (INDEPENDENT_AMBULATORY_CARE_PROVIDER_SITE_OTHER): Payer: Medicare Other | Admitting: Internal Medicine

## 2017-08-13 VITALS — BP 142/82 | HR 64 | Ht 65.0 in | Wt 195.0 lb

## 2017-08-13 DIAGNOSIS — R932 Abnormal findings on diagnostic imaging of liver and biliary tract: Secondary | ICD-10-CM | POA: Diagnosis not present

## 2017-08-13 DIAGNOSIS — I6523 Occlusion and stenosis of bilateral carotid arteries: Secondary | ICD-10-CM

## 2017-08-13 DIAGNOSIS — R748 Abnormal levels of other serum enzymes: Secondary | ICD-10-CM

## 2017-08-13 LAB — PROTIME-INR
INR: 1.1 ratio — AB (ref 0.8–1.0)
Prothrombin Time: 11.8 s (ref 9.6–13.1)

## 2017-08-13 LAB — HEPATIC FUNCTION PANEL
ALBUMIN: 3.5 g/dL (ref 3.5–5.2)
ALT: 74 U/L — AB (ref 0–35)
AST: 43 U/L — AB (ref 0–37)
Alkaline Phosphatase: 46 U/L (ref 39–117)
Bilirubin, Direct: 0.1 mg/dL (ref 0.0–0.3)
Total Bilirubin: 0.3 mg/dL (ref 0.2–1.2)
Total Protein: 7 g/dL (ref 6.0–8.3)

## 2017-08-13 NOTE — Patient Instructions (Addendum)
Your physician has requested that you go to the basement for the lab work before leaving today.    We will see you on October 16th at 1:45pm for follow up.    I appreciate the opportunity to care for you. Silvano Rusk, MD, [facility]

## 2017-08-13 NOTE — Progress Notes (Signed)
Lauren [name] 66 y.o. April 23, 1951 [id number]  Assessment & Plan:   Encounter Diagnoses  Name Primary?  . Abnormal liver CT Yes  . Abnormal transaminases    Clinically she does not appear to have cirrhosis. It's possible. Methotrexate is a possible culprit. Fatty liver is also quite possible. Globulin fraction mildly high, ANA is negative which goes against autoimmune phenomenon. If she did have cirrhosis she's compensated and a Child's A. I think it's possible her abnormal transaminases could be related to her right-sided hydronephrosis. I think that's her most pressing issue. Passive congestion could play a role as well though I don't detect that on exam today she has a cardiology appointment tomorrow. We'll see what her cardiologist thinks.  I think it makes sense to stay off the methotrexate for the time being. I have checked transaminases again and her AST was 43 and ALT 74 with normal bilirubin and albumin alkaline phosphatase. Hepatitis B surface antigen is nonreactive. The Titus B core antibody total was reactive. In the past she was hepatitis C negative. INR was 1.1 which is also reassuring with a pro time of 11.8 seconds.  I plan to see her back in October sooner if needed. I don't think interventions are a problem with respect of bleeding and possible liver disease etc.  Further plans pending clinical course. Given the methotrexate issues with the LFTs remain up, might need to consider a liver biopsy. Especially if it is thought she needs that medication again.  I appreciate the opportunity to care for this patient. CC: [name], MD Drs. Mihai Croitoru, Kathie Rhodes, Manuela Schwartz Stinehelfer  Subjective:   Chief Complaint: abnormal liver tests and CT  HPI The patient is here with her husband. She recently had a CT of the abdomen and pelvis. It demonstrated a nodular contour to the liver and raised questions of cirrhosis.  "Liver is prominent with a subtly nodular  contour. Question degree of underlying hepatic cirrhosis. No focal liver lesions are demonstrable. Gallbladder is absent with biliary duct dilatation. No biliary duct mass or calculus evident. "  She also has one and a half to 2 times abnormal transaminases which is new. She has hydronephrosis on the right and is about to have a procedure from Dr. Karsten Ro of urology for that. She is due to see her cardiologist tomorrow. No known history of transfusions or drug abuse or obvious risk factors for liver disease and she does not drink. No family history of liver disease. As part of a neurologic workup he had an ANA and Anka sedimentation rate and Sjogren's panel recently in the spring and these were all negative.  She has taken methotrexate for psoriasis for years and her dermatologist recently stopped this.  In 2017 I performed an EGD which demonstrated mild erythema in the antrum consistent with a gastritis. 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 I did not plan repeat colonoscopy due to comorbidities. Allergies  Allergen Reactions  . Ambien [Zolpidem Tartrate] Other (See Comments)    Per the pt, "it makes me go crazy"  . Sulfa Antibiotics Rash  . Elavil [Amitriptyline Hcl] Other (See Comments)    Gastritis  . Prozac [Fluoxetine Hcl] Anxiety and Other (See Comments)    irritability    Current Meds  Medication Sig  . Albuterol Sulfate (PROAIR RESPICLICK) 308 (90 Base) MCG/ACT AEPB Inhale 2 puffs into the lungs 4 (four) times daily as needed (congestion, cough, wheezing).  Marland Kitchen aspirin EC  81 MG tablet Take 81 mg by mouth daily.  . betamethasone dipropionate (DIPROLENE) 0.05 % ointment Apply 1 application topically 2 (two) times daily as needed (psoriasis).   . Cholecalciferol (VITAMIN D3) 1000 UNITS CAPS Take 1,000 Units by mouth daily.   . Cyanocobalamin (VITAMIN B-12 CR) 1500 MCG TBCR Take 1,500 mcg by mouth daily.   Marland Kitchen escitalopram (LEXAPRO) 20 MG  tablet Take 20 mg by mouth daily.  Marland Kitchen esomeprazole (NEXIUM) 40 MG capsule Take 40 mg by mouth daily.   . ferrous sulfate 325 (65 FE) MG tablet Take 325 mg by mouth 2 (two) times daily with a meal.  . Fluticasone-Salmeterol (ADVAIR) 250-50 MCG/DOSE AEPB Inhale 1 puff into the lungs every 12 (twelve) hours.    . folic acid (FOLVITE) 1 MG tablet Take 1 mg by mouth daily.  . furosemide (LASIX) 20 MG tablet Take 20 mg by mouth 2 (two) times daily.  Marland Kitchen gabapentin (NEURONTIN) 600 MG tablet Take 600 mg by mouth 6 (six) times daily. 6am, 10am, 4pm, 6pm, 10pm, midnight  . glucose blood (ACCU-CHEK AVIVA PLUS) test strip 1 each by Other route as needed. Use as instructed   . losartan (COZAAR) 100 MG tablet Take 100 mg by mouth daily.    . magnesium oxide (MAG-OX) 400 MG tablet Take 400 mg by mouth daily.  . Melatonin 5 MG TABS Take 10 mg by mouth at bedtime.  . metaxalone (SKELAXIN) 800 MG tablet Take 800 mg by mouth 3 (three) times daily. 6am, noon, 6pm  . metoprolol succinate (TOPROL-XL) 50 MG 24 hr tablet Take 50 mg by mouth daily.  . mirabegron ER (MYRBETRIQ) 50 MG TB24 tablet Take 50 mg by mouth daily.  . Multiple Vitamin (MULTIVITAMIN WITH MINERALS) TABS tablet Take 1 tablet by mouth daily.  . nitrofurantoin (MACRODANTIN) 100 MG capsule Take 100 mg by mouth at bedtime.   . nitroGLYCERIN (NITROSTAT) 0.4 MG SL tablet Place 1 tablet (0.4 mg total) under the tongue every 5 (five) minutes as needed for chest pain.  . nortriptyline (PAMELOR) 10 MG capsule Take 20 mg by mouth at bedtime.   . ondansetron (ZOFRAN-ODT) 4 MG disintegrating tablet Take 4 mg by mouth every 8 (eight) hours as needed for nausea or vomiting.   Marland Kitchen oxymorphone (OPANA ER) 15 MG 12 hr tablet Take 15 mg by mouth every 12 (twelve) hours.  . potassium chloride (KLOR-CON) 10 MEQ CR tablet Take 10 mEq by mouth 2 (two) times daily.   . rosuvastatin (CRESTOR) 5 MG tablet Take 5 mg by mouth at bedtime.  . solifenacin (VESICARE) 5 MG tablet Take 5  mg by mouth 2 (two) times daily.  . tapentadol (NUCYNTA) 50 MG tablet Take 50 mg by mouth every 6 (six) hours.   Past Medical History:  Diagnosis Date  . Adenomatous colon polyp   . Anemia   . Anxiety   . AVM (arteriovenous malformation)    cecum  . CAD (coronary artery disease)   . Carotid artery occlusion   . Cataract    bil cateracts removed  . COPD (chronic obstructive pulmonary disease) ([place])   . Cough   . DDD (degenerative disc disease), lumbar   . Depression   . Diabetes mellitus   . Elevated LFTs   . Endometrial ca [facility])    endometrial ca dx 4/11  . GERD (gastroesophageal reflux disease)   . Gout 2015  . Hernia of flank   . Hydronephrosis   . Hyperlipidemia   . Hypertension   .  Insomnia   . OAB (overactive bladder)   . Osteoarthritis   . Peripheral neuropathy    following chemotherapy  . Psoriasis   . Pulmonary nodule    since 2016  . Radiation 05/01/2011-06/11/11   5600 cGy 28 fxs/periaortic  . Stroke ([place])    tia  . Thyroid nodule   . TIA (transient ischemic attack) 2015  . 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 Uterine cancer ([place])   . Ventral hernia    postoperative since 2009   Past Surgical History:  Procedure Laterality Date  . ABDOMINAL HYSTERECTOMY  2009   for ca  . BACK SURGERY  Aug/sept 2014   3 back surgeries  (Aug 2014 & Sept 2014)  . CHOLECYSTECTOMY     biliteral  . COLONOSCOPY  11/13/2014   negative except for a small cecal AVM and 1 polyp  . ENDARTERECTOMY Right 08/17/2015   Procedure: Right Carotid Endartarectomy with patch Angioplasty ;  Surgeon: Mal Misty, MD;  Location: [facility];  Service: Vascular;  Laterality: Right;  . EYE SURGERY Right Aug. 2013   Cataract, lens implant  . EYE SURGERY Left Sept. 2013   Cataract, lens implant  . I & D of lumbar fusion /removal of hardware for chronic infection  Jan. 8, 2015  . right carotid endarterectomy  2016  . SPINE SURGERY     Social History   Social  History  . Marital status: Married    Spouse name: N/A  . Number of children: 1  . Years of education: College   Occupational History  . disabled    Social History Main Topics  . Smoking status: Former Smoker    Packs/day: 0.10    Years: 40.00    Types: Cigarettes    Quit date: 01/2017  . Smokeless tobacco: Never Used  . Alcohol use No  . Drug use: No  . Sexual activity: No    Social History Narrative   Married, 1 daughter   Disabled   Right-hand   Caffeine: caffeine free sodas   family history includes Diabetes in her daughter; Heart attack (age of onset: 43) in her father; Heart disease in her father; Hyperlipidemia in her father; Hypertension in her daughter and father; Irritable bowel syndrome in her daughter and mother; Stroke in her mother.   Review of Systems Chronic back pain. Urinary frequency. Fatigue and aching. She uses a wheelchair. Reduced mobility. Chronic peripheral edema All other review of systems negative or as per history of present illness  Objective:   Physical Exam @BP  (!) 142/82 (BP Location: Left Arm, Patient Position: Sitting, Cuff Size: Normal)   Pulse 64   Ht 5\' 5"  (1.651 m)   Wt 195 lb (88.5 kg)   SpO2 97%   BMI 32.45 kg/m @  General:  Chronically ill in a wheelchair Eyes:  anicteric. ENT:   Mouth and posterior pharynx free of lesions.  Lungs: Clear to auscultation bilaterally. Heart:   S1S2, no rubs, murmurs, gallops. I don't see any JVD Abdomen:  There is a massive ventral hernia in the low midline and more prominent on the right mildly tender slightly firm, well-healed surgical scars. No prominent veins, no hepatosplenomegaly detected  Lymph:  no cervical or supraclavicular adenopathy. Extremities:   1+ chronic brawny lower extremity edema into the mid pretibial areas, cyanosis or clubbing Skin   no rash. I do not see any stigmata of chronic liver disease, there are a  few chary angioma nevi but no clear spider angiomata Neuro:  A&O x  3.  Psych:  appropriate mood and  Affect.   Data Reviewed: PCP notes extensive from 2018. CT scan including images. Labs from 07/30/2017 show alkaline phosphatase and bilirubin normal, her AST is 67 with top normal 35 ALT 62 with top normal 29, the albumin is 3.5 for total protein is 8.2 with a globulin is 4.7 which is mildly high. CBC with platelet count 379 white count 14.2 hemoglobin 10.4 MCV 86.9

## 2017-08-14 ENCOUNTER — Ambulatory Visit (INDEPENDENT_AMBULATORY_CARE_PROVIDER_SITE_OTHER): Payer: Medicare Other | Admitting: Cardiovascular Disease

## 2017-08-14 ENCOUNTER — Encounter: Payer: Self-pay | Admitting: Cardiovascular Disease

## 2017-08-14 VITALS — BP 140/60 | HR 62 | Ht 65.0 in | Wt 195.0 lb

## 2017-08-14 DIAGNOSIS — E0851 Diabetes mellitus due to underlying condition with diabetic peripheral angiopathy without gangrene: Secondary | ICD-10-CM

## 2017-08-14 DIAGNOSIS — I10 Essential (primary) hypertension: Secondary | ICD-10-CM

## 2017-08-14 DIAGNOSIS — I5033 Acute on chronic diastolic (congestive) heart failure: Secondary | ICD-10-CM | POA: Diagnosis not present

## 2017-08-14 DIAGNOSIS — I2584 Coronary atherosclerosis due to calcified coronary lesion: Secondary | ICD-10-CM | POA: Diagnosis not present

## 2017-08-14 DIAGNOSIS — Z79899 Other long term (current) drug therapy: Secondary | ICD-10-CM

## 2017-08-14 DIAGNOSIS — I6523 Occlusion and stenosis of bilateral carotid arteries: Secondary | ICD-10-CM

## 2017-08-14 DIAGNOSIS — D500 Iron deficiency anemia secondary to blood loss (chronic): Secondary | ICD-10-CM

## 2017-08-14 DIAGNOSIS — I2510 Atherosclerotic heart disease of native coronary artery without angina pectoris: Secondary | ICD-10-CM | POA: Diagnosis not present

## 2017-08-14 DIAGNOSIS — I2781 Cor pulmonale (chronic): Secondary | ICD-10-CM | POA: Diagnosis not present

## 2017-08-14 DIAGNOSIS — I739 Peripheral vascular disease, unspecified: Secondary | ICD-10-CM | POA: Diagnosis not present

## 2017-08-14 DIAGNOSIS — E782 Mixed hyperlipidemia: Secondary | ICD-10-CM

## 2017-08-14 DIAGNOSIS — R748 Abnormal levels of other serum enzymes: Secondary | ICD-10-CM

## 2017-08-14 DIAGNOSIS — R600 Localized edema: Secondary | ICD-10-CM | POA: Diagnosis not present

## 2017-08-14 DIAGNOSIS — Z87891 Personal history of nicotine dependence: Secondary | ICD-10-CM

## 2017-08-14 LAB — HEPATITIS B CORE ANTIBODY, TOTAL: HEP B C TOTAL AB: REACTIVE — AB

## 2017-08-14 LAB — HEPATITIS B SURFACE ANTIGEN: HEP B S AG: NONREACTIVE

## 2017-08-14 MED ORDER — FUROSEMIDE 40 MG PO TABS: 40.0000 mg | ORAL_TABLET | Freq: Two times a day (BID) | ORAL | 3 refills | Status: DC

## 2017-08-14 MED ORDER — POTASSIUM CHLORIDE CRYS ER 20 MEQ PO TBCR: 20.0000 meq | EXTENDED_RELEASE_TABLET | Freq: Two times a day (BID) | ORAL | 3 refills | Status: DC

## 2017-08-14 NOTE — Progress Notes (Signed)
07-22-17 (EPIC) CXR  07-20-17 HGA1C  7.2, CMP (abnormal), CBC (abnormal) on chart  02-02-17 (EPIC) EKG  12-04-16 (EPIC) ECHO, 55% EF, no stenosis, no regurgitation

## 2017-08-14 NOTE — Patient Instructions (Addendum)
Lauren [name]  08/14/2017   Your procedure is scheduled on: 08-24-17  Report to [facility] Main  Entrance Take [place]  elevators to 3rd floor to  [place] at 5:30 AM.    Call this number if you have problems the morning of surgery [phone]   Remember: ONLY 1 PERSON MAY GO WITH YOU TO SHORT STAY TO GET  READY MORNING OF [place].  Do not eat food or drink liquids :After Midnight.     Take these medicines the morning of surgery with A SIP OF WATER: Escitalopram (Lexapro), Esomeprazole (Nexium), Gabapentin (Neurotin), and Metoprolol (Toprol). You may use your pain medication as needed.   You may not have any metal on your body including hair pins and              piercings  Do not wear jewelry, make-up, lotions, powders or perfumes, deodorant             Do not wear nail polish.  Do not shave  48 hours prior to surgery.                 Do not bring valuables to the hospital. [place].  Contacts, dentures or bridgework may not be worn into surgery.       Patients discharged the day of surgery will not be allowed to drive home.  Name and phone number of your driver: Lauren [name] [phone]               Please read over the following fact sheets you were given: _____________________________________________________________________   How to Manage Your Diabetes Before and After Surgery  Why is it important to control my blood sugar before and after surgery? . Improving blood sugar levels before and after surgery helps healing and can limit problems. . A way of improving blood sugar control is eating a healthy diet by: o  Eating less sugar and carbohydrates o  Increasing activity/exercise o  Talking with your doctor about reaching your blood sugar goals . High blood sugars (greater than 180 mg/dL) can raise your risk of infections and slow your recovery, so you will need to focus on controlling  your diabetes during the weeks before surgery. . Make sure that the doctor who takes care of your diabetes knows about your planned surgery including the date and location.  How do I manage my blood sugar before surgery? . Check your blood sugar at least 4 times a day, starting 2 days before surgery, to make sure that the level is not too high or low. o Check your blood sugar the morning of your surgery when you wake up and every 2 hours until you get to the Short Stay unit. . If your blood sugar is less than 70 mg/dL, you will need to treat for low blood sugar: o Do not take insulin. o Treat a low blood sugar (less than 70 mg/dL) with  cup of clear juice (cranberry or apple), 4 glucose tablets, OR glucose gel. o Recheck blood sugar in 15 minutes after treatment (to make sure it is greater than 70 mg/dL). If your blood sugar is not greater than 70 mg/dL on recheck, call [phone] for further instructions. . Report your blood sugar to the short stay nurse when you get  to Short Stay.  . If you are admitted to the hospital after surgery: o Your blood sugar will be checked by the staff and you will probably be given insulin after surgery (instead of oral diabetes medicines) to make sure you have good blood sugar levels. o The goal for blood sugar control after surgery is 80-180 mg/dL.   WHAT DO I DO ABOUT MY DIABETES MEDICATION?  Marland Kitchen Do not take oral diabetes medicines (pills) the morning of surgery.  . THE EVENING BEFORE SURGERY, take   22  units of  Lantus.       . THE MORNING OF SURGERY, Do not take your Lantus, as you customarily take it in the evening. .   Patient Signature:  Date:   Nurse Signature:  Date:   Reviewed and Endorsed by [facility] Patient Education Committee, August 2015                                       [facility] - Preparing for Surgery Before surgery, you can play an important role.  Because skin is not sterile, your skin needs to be as free of germs as  possible.  You can reduce the number of germs on your skin by washing with CHG (chlorahexidine gluconate) soap before surgery.  CHG is an antiseptic cleaner which kills germs and bonds with the skin to continue killing germs even after washing. Please DO NOT use if you have an allergy to CHG or antibacterial soaps.  If your skin becomes reddened/irritated stop using the CHG and inform your nurse when you arrive at Short Stay. Do not shave (including legs and underarms) for at least 48 hours prior to the first CHG shower.  You may shave your face/neck. Please follow these instructions carefully:  1.  Shower with CHG Soap the night before surgery and the  morning of Surgery.  2.  If you choose to wash your hair, wash your hair first as usual with your  normal  shampoo.  3.  After you shampoo, rinse your hair and body thoroughly to remove the  shampoo.                           4.  Use CHG as you would any other liquid soap.  You can apply chg directly  to the skin and wash                       Gently with a scrungie or clean washcloth.  5.  Apply the CHG Soap to your body ONLY FROM THE NECK DOWN.   Do not use on face/ open                           Wound or open sores. Avoid contact with eyes, ears mouth and genitals (private parts).                       Wash face,  Genitals (private parts) with your normal soap.             6.  Wash thoroughly, paying special attention to the area where your surgery  will be performed.  7.  Thoroughly rinse your body with warm water from the neck down.  8.  DO NOT shower/wash with  your normal soap after using and rinsing off  the CHG Soap.                9.  Pat yourself dry with a clean towel.            10.  Wear clean pajamas.            11.  Place clean sheets on your bed the night of your first shower and do not  sleep with pets. Day of Surgery : Do not apply any lotions/deodorants the morning of surgery.  Please wear clean clothes to the hospital/surgery  center.  FAILURE TO FOLLOW THESE INSTRUCTIONS MAY RESULT IN THE CANCELLATION OF YOUR SURGERY PATIENT SIGNATURE_________________________________  NURSE SIGNATURE__________________________________  ________________________________________________________________________

## 2017-08-14 NOTE — Patient Instructions (Signed)
Dr Sallyanne Kuster has recommended making the following medication changes: 1. INCREASE Furosemide to 40 mg twice daily 2. INCREASE Potassium to 20 mEq twice daily  Your physician recommends that you return for lab work in 2 weeks.  Dr Sallyanne Kuster recommends that you schedule a follow-up appointment in 3-4 months.  If you need a refill on your cardiac medications before your next appointment, please call your pharmacy.

## 2017-08-14 NOTE — Progress Notes (Signed)
Cardiology Consultation Note    Date:  08/15/2017   ID:  Lauren [name], DOB 06-24-1951, MRN [id number]  PCP:  [name], MD  Cardiologist:   Sanda Klein, MD   Chief Complaint  Patient presents with  . Follow-up    Fluid bluid up and CT scan showed swelling in her kidney.  . Edema    In her legs.    History of Present Illness:  Lauren [name] is a 66 y.o. female referred by Dr. Sandi Mariscal for edema.  She has been started on low dose furosemide (instead of thiazide) and K supplement. Today she weighs 17 lb more than when I last saw her, but has lost 5 lb in the last 2 weeks. She still has 2-3+ symmetrical leg edema that persists even after overnight supine position. BNP was only 156 on July 23/2018. Creatinine was 1.35 on August 2 (GFR 41 mL/min).  The swelling might be temporally associated with administration of IVIG for her neuropathy.  In parallel, she has developed abnormalities in her transaminases and her CT raised concern for nodular liver changes of cirrhosis. She saw Dr. Carlean Purl yesterday and he did not find other signs to support cirrhosis and felt that fatty liver was also an unlikely explanation for the lab anomalies. Methotrexate was stopped.  She has R hydronephrosis (unilateral dilated ureter all the way to the bladder, no current stone) and is seeing Dr. Karsten Ro.  Her abdominal-pelvic CT also showed probably significant stenosis of the aorto-bilateral iliac bifurcation and multiple stenoses in the iliac and common femoral arteries on bothe sides.  Both [place] and her husband have quit smoking. She is seeing Dr. Melvyn Novas for severe COPD (FEV1 0.79L, 30% predicted) and a pulmonary nodule.  Mrs. Baisch has coronary artery calcification on CT scan and has numerous coronary risk factors (hypertension, hyperlipidemia, diabetes, positive family history, ongoing cigarette smoking) and already has well-established peripheral arterial disease (R carotid  endarterectomy 2016, L carotid 40%). However, she does not have angina pectoris and had a low risk nuclear stress test in 2014 and again in 2016. Echo in 2017 showed normal LVEF, mild LVH, elevated mean LA pressure and mild PAH 36 mm Hg).  Activity is limited by severe lumbar radiculopathy, especially bad left sciatica pain. She has severe sensorimotor neuropathy and is now getting a course of IVIG to see if that helps. She has not yet had the lumbar spine surgery that we "cleared" her for at her last appointment.  She is very sedentary. She does not have angina or dyspnea with activities of daily living.  She has mild microcytic anemia, Hgb 9.9 last fall, now 10.4 and no longer microcytic on Fe supplement. In 2015 she had EGD and colonoscopy and was found to have angiodysplasia. Repeat EGD in 2017 showed only mild antral erythema.  Past Medical History:  Diagnosis Date  . Adenomatous colon polyp   . Anemia   . Anxiety   . AVM (arteriovenous malformation)    cecum  . CAD (coronary artery disease)   . Carotid artery occlusion   . Cataract    bil cateracts removed  . COPD (chronic obstructive pulmonary disease) ([place])   . Cough   . DDD (degenerative disc disease), lumbar   . Depression   . Diabetes mellitus   . Elevated LFTs   . Endometrial ca [facility])    endometrial ca dx 4/11  . GERD (gastroesophageal reflux disease)   . Gout 2015  . Hernia of flank   .  Hydronephrosis   . Hyperlipidemia   . Hypertension   . Insomnia   . OAB (overactive bladder)   . Osteoarthritis   . Peripheral neuropathy    following chemotherapy  . Psoriasis   . Pulmonary nodule    since 2016  . Radiation 05/01/2011-06/11/11   5600 cGy 28 fxs/periaortic  . Stroke ([place])    tia  . Thyroid nodule   . TIA (transient ischemic attack) 2015  . 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 Uterine cancer ([place])   . Ventral hernia    postoperative since 2009    Past Surgical History:    Procedure Laterality Date  . ABDOMINAL HYSTERECTOMY  2009   for ca  . BACK SURGERY  Aug/sept 2014   3 back surgeries  (Aug 2014 & Sept 2014)  . CHOLECYSTECTOMY     biliteral  . COLONOSCOPY  11/13/2014   negative except for a small cecal AVM and 1 polyp  . ENDARTERECTOMY Right 08/17/2015   Procedure: Right Carotid Endartarectomy with patch Angioplasty ;  Surgeon: Mal Misty, MD;  Location: [facility];  Service: Vascular;  Laterality: Right;  . EYE SURGERY Right Aug. 2013   Cataract, lens implant  . EYE SURGERY Left Sept. 2013   Cataract, lens implant  . I & D of lumbar fusion /removal of hardware for chronic infection  Jan. 8, 2015  . right carotid endarterectomy  2016  . SPINE SURGERY      Current Medications: Outpatient Medications Prior to Visit  Medication Sig Dispense Refill  . Albuterol Sulfate (PROAIR RESPICLICK) 458 (90 Base) MCG/ACT AEPB Inhale 2 puffs into the lungs 4 (four) times daily as needed (congestion, cough, wheezing).    Marland Kitchen aspirin EC 81 MG tablet Take 81 mg by mouth daily.    . betamethasone dipropionate (DIPROLENE) 0.05 % ointment Apply 1 application topically 2 (two) times daily as needed (psoriasis).     . Cholecalciferol (VITAMIN D3) 1000 UNITS CAPS Take 1,000 Units by mouth daily.     Marland Kitchen escitalopram (LEXAPRO) 20 MG tablet Take 20 mg by mouth daily.    Marland Kitchen esomeprazole (NEXIUM) 40 MG capsule Take 40 mg by mouth daily.     . ferrous sulfate 325 (65 FE) MG tablet Take 325 mg by mouth daily.     . Fluticasone-Salmeterol (ADVAIR) 250-50 MCG/DOSE AEPB Inhale 1 puff into the lungs 2 (two) times daily as needed (shortness of breath).     . folic acid (FOLVITE) 1 MG tablet Take 1 mg by mouth daily.    Marland Kitchen gabapentin (NEURONTIN) 600 MG tablet Take 600 mg by mouth 6 (six) times daily. 6am, 10am, 4pm, 6pm, 10pm, midnight    . glucose blood (ACCU-CHEK AVIVA PLUS) test strip 1 each by Other route as needed. Use as instructed     . insulin glargine (LANTUS) 100 UNIT/ML injection  Inject 45 Units into the skin at bedtime.     Marland Kitchen losartan (COZAAR) 100 MG tablet Take 100 mg by mouth daily.      . magnesium oxide (MAG-OX) 400 MG tablet Take 400 mg by mouth daily.    . Melatonin 10 MG TABS Take 10 mg by mouth at bedtime.    . metaxalone (SKELAXIN) 800 MG tablet Take 800 mg by mouth 3 (three) times daily. 6am, noon, 6pm    . metoprolol succinate (TOPROL-XL) 50 MG 24 hr tablet Take 50 mg by mouth daily.    . mirabegron  ER (MYRBETRIQ) 50 MG TB24 tablet Take 50 mg by mouth daily.    . Multiple Vitamin (MULTIVITAMIN WITH MINERALS) TABS tablet Take 1 tablet by mouth daily.    . nitrofurantoin (MACRODANTIN) 100 MG capsule Take 100 mg by mouth at bedtime.     . nitroGLYCERIN (NITROSTAT) 0.4 MG SL tablet Place 1 tablet (0.4 mg total) under the tongue every 5 (five) minutes as needed for chest pain. 25 tablet 3  . nortriptyline (PAMELOR) 10 MG capsule Take 10 mg by mouth at bedtime.     Marland Kitchen nystatin (MYCOSTATIN) 100000 UNIT/ML suspension Take 5 mLs by mouth 4 (four) times daily. Swish and swallow    . ondansetron (ZOFRAN-ODT) 4 MG disintegrating tablet Take 4 mg by mouth every 8 (eight) hours as needed for nausea or vomiting.     . OXYCONTIN 15 MG 12 hr tablet Take 15 mg by mouth every 12 (twelve) hours.    . rosuvastatin (CRESTOR) 5 MG tablet Take 5 mg by mouth at bedtime.    . tapentadol (NUCYNTA) 50 MG tablet Take 50 mg by mouth every 6 (six) hours. 6 am, noon, 6pm  And midnight    . VESICARE 10 MG tablet Take 10 mg by mouth daily.    . vitamin B-12 (CYANOCOBALAMIN) 1000 MCG tablet Take 1,000 mcg by mouth daily.    . furosemide (LASIX) 20 MG tablet Take 20 mg by mouth 2 (two) times daily.    . potassium chloride (KLOR-CON) 10 MEQ CR tablet Take 10 mEq by mouth 2 (two) times daily.      No facility-administered medications prior to visit.      Allergies:   Ambien [zolpidem tartrate]; Sulfa antibiotics; Elavil [amitriptyline hcl]; and Prozac [fluoxetine hcl]   Social History    Social History  . Marital status: Married    Spouse name: N/A  . Number of children: 1  . Years of education: College   Occupational History  . disabled    Social History Main Topics  . Smoking status: Former Smoker    Packs/day: 0.10    Years: 40.00    Types: Cigarettes    Quit date: 01/2017  . Smokeless tobacco: Never Used  . Alcohol use No  . Drug use: No  . Sexual activity: No   Other Topics Concern  . None   Social History Narrative   Married, 1 daughter   Disabled   Right-hand   Caffeine: caffeine free sodas     Family History:  The patient's family history includes Diabetes in her daughter; Heart attack (age of onset: 69) in her father; Heart disease in her father; Hyperlipidemia in her father; Hypertension in her daughter and father; Irritable bowel syndrome in her daughter and mother; Stroke in her mother.   ROS:   Please see the history of present illness.    ROS All other systems reviewed and are negative.   PHYSICAL EXAM:   VS:  BP 140/60   Pulse 62   Ht '5\' 5"'$  (1.651 m)   Wt 195 lb (88.5 kg) Comment: Patient says.  BMI 32.45 kg/m    GEN: Well nourished, well developed, in no acute distress   General: Alert, oriented x3, no distress Head: no evidence of trauma, PERRL, EOMI, no exophtalmos or lid lag, no myxedema, no xanthelasma; normal ears, nose and oropharynx Neck: 8-9 cm increase in jugular venous pulsations and prompt hepatojugular reflux to the jaw; brisk carotid pulses without delay; she has bilateral carotid bruits, very musical  on the left. Scar of Rt CEA. Chest: distant breath sounds, but clear to auscultation, no signs of consolidation by percussion or palpation, normal fremitus, symmetrical and full respiratory excursions Cardiovascular: normal position and quality of the apical impulse, regular rhythm, normal first and second heart sounds, no murmurs, rubs or gallops Abdomen: no tenderness or distention, no masses by palpation, no abnormal  pulsatility or arterial bruits, normal bowel sounds, no hepatosplenomegaly Extremities: no clubbing, cyanosis; prominent varicose veins; 2-3+ pitting edema bilaterally; 2+ radial, ulnar and brachial pulses bilaterally; hard to feel pulses in feet due to edema. She has bilateral femoral bruits. Neurological: grossly nonfocal, in wheelchair Psych: euthymic mood, full affect  Wt Readings from Last 3 Encounters:  08/14/17 195 lb (88.5 kg)  08/13/17 195 lb (88.5 kg)  07/30/17 200 lb (90.7 kg)      Studies/Labs Reviewed:   EKG:  EKG is not  ordered today.  The ekg ordered  02/03/2017 demonstrates Normal sinus rhythm, diffuse mild ST depression  Recent Labs: 09/02/2016 Total cholesterol 150, LDL 86, LDL particle #1065, HDL 38, triglyceride 129 Hemoglobin A1c 5.8% Creatinine 1.15, hemoglobin 9.9 with microcytic hypochromic indices, normal TSH September 14 ferritin 18, iron saturation 15%  07/20/2017 and 07/30/2017: BNP 156, A1c 7.2%, Hgb 10.4, AST 55, ALT 111, Bili 0.3, alk phos 49, K 4.6, creat 1.21, glu 79  Additional studies/ records that were reviewed today include:  Detailed notes provided by Dr. Sandi Mariscal, notes from Dr. Carlean Purl, Melvyn Novas and Jaynee Eagles all reviewed  ASSESSMENT:    1. Acute on chronic diastolic heart failure ([place])   2. Cor pulmonale, chronic ([place])   3. Abnormal transaminases   4. Coronary artery calcification of native artery   5. PAD (peripheral artery disease) ([facility])   6. Carotid stenosis, bilateral   7. Essential hypertension   8. Diabetes mellitus due to underlying condition with diabetic peripheral angiopathy without gangrene, unspecified whether long term insulin use ([place])   9. Mixed hyperlipidemia   10. History of cigarette smoking   11. Iron deficiency anemia due to chronic blood loss   12. Bilateral lower extremity edema   13. Medication management      PLAN:  In order of problems listed above:  1. CHF: clearly still hypervolemic by exam. Weight up 17 lb  seen I last saw her (but has also quit smoking and may have gained real weight). Suspect at least 10-12 lb of excess fluid, if not 17. Some incomplete response on low dose furosemide, so will double the dose. This is probably mostly exacerbation of chronic COPD-related cor pulmonale/right heart failure alone, maybe worsened by increased volume administration with IVIG. Peripheral venous insufficiency makes the edema worse. Her old echo did show elevated mean left atrial pressure as well (LVH related), but her breathing is really no worse, suggesting mostly right heart failure. Reviewed sodium restriction. Recheck labs in 2 weeks. 2. RHF/cor pulmonale/PAH: probably the major part of her fluid retention problem. 3. Abnormal liver tests: the 2x ULN transaminases are likely explained by passive congestion due to CHF. 4. Coronary calcification: It is very likely that Mrs. Garofano has some coronary artery disease and stenoses, but she does not have angina or ischemia on nuclear scans.and has normal LVEF. The focus remains on risk factor modification. Very pleased that she has quit smoking. Diabetes control is excellent and her lipid profile is for the most part acceptable, with the exception of low HDL.  5. PAD:  If she were more active, suspect she could  have claudication. 6. Carotid stenosis: only mild plaque at this time, last Doppler study in February 2018. Asymptomatic. Patent right endarterectomy site and stable mild left-sided obstruction. 7. HTN: Blood pressure control has been generally good, although ideally would want SBP<130. Should decrease with diuresis. 8. DM: Excellent control last year, now a little worse with A1c 7.2%. Maybe after she quit smoking, intake of carbs has increased. 9. HLP: As above, the major remaining problem is low HDL cholesterol, which is expected to improve with further weight loss and more physical activity after her back problems improve. 10. Tobacco abuse: Congratulated on  quitting.. 11. Iron deficiency anemia: Slight improvement. Recent EGD low risk. Known to have angiodysplasia of the right colon. If aspirin needs to be interrupted temporarily, I don't think this would be a higher risk from a cardiac point of view. However, in the long run she really needs aspirin therapy due to her carotid disease and potential coronary problems.    Medication Adjustments/Labs and Tests Ordered: Current medicines are reviewed at length with the patient today.  Concerns regarding medicines are outlined above.  Medication changes, Labs and Tests ordered today are listed in the Patient Instructions below. Patient Instructions  Dr Sallyanne Kuster has recommended making the following medication changes: 1. INCREASE Furosemide to 40 mg twice daily 2. INCREASE Potassium to 20 mEq twice daily  Your physician recommends that you return for lab work in 2 weeks.  Dr Sallyanne Kuster recommends that you schedule a follow-up appointment in 3-4 months.  If you need a refill on your cardiac medications before your next appointment, please call your pharmacy.    Signed, Sanda Klein, MD  08/15/2017 3:12 PM    [facility] [address], [place], [place]  [address] Phone: [phone]; Fax: [phone]

## 2017-08-15 DIAGNOSIS — I5032 Chronic diastolic (congestive) heart failure: Secondary | ICD-10-CM | POA: Insufficient documentation

## 2017-08-15 DIAGNOSIS — I739 Peripheral vascular disease, unspecified: Secondary | ICD-10-CM | POA: Insufficient documentation

## 2017-08-15 DIAGNOSIS — Z87891 Personal history of nicotine dependence: Secondary | ICD-10-CM | POA: Insufficient documentation

## 2017-08-15 DIAGNOSIS — I2781 Cor pulmonale (chronic): Secondary | ICD-10-CM | POA: Insufficient documentation

## 2017-08-15 NOTE — Progress Notes (Signed)
I agree that her LFTs are likely due to right heart failure and congestion. Thanks for the heads up, Glendell Docker. [place]

## 2017-08-17 ENCOUNTER — Other Ambulatory Visit: Payer: Self-pay

## 2017-08-17 ENCOUNTER — Ambulatory Visit
Admission: RE | Admit: 2017-08-17 | Discharge: 2017-08-17 | Disposition: A | Discharge status: Home / Self Care | Payer: Medicare Other | Source: Ambulatory Visit | Attending: Surgery | Admitting: Surgery

## 2017-08-17 DIAGNOSIS — R7989 Other specified abnormal findings of blood chemistry: Secondary | ICD-10-CM

## 2017-08-17 DIAGNOSIS — R945 Abnormal results of liver function studies: Principal | ICD-10-CM

## 2017-08-17 DIAGNOSIS — E041 Nontoxic single thyroid nodule: Secondary | ICD-10-CM

## 2017-08-17 NOTE — Progress Notes (Signed)
Hep b surface

## 2017-08-17 NOTE — Progress Notes (Signed)
Liver tests about the same - that's good news I think  Does not seem to have active hepatitis B - may have had in past  I need her to do hepatitis B surface antibody when she can or if lab can add

## 2017-08-18 ENCOUNTER — Encounter (HOSPITAL_COMMUNITY): Payer: Self-pay

## 2017-08-18 ENCOUNTER — Encounter (HOSPITAL_COMMUNITY)
Admission: RE | Admit: 2017-08-18 | Discharge: 2017-08-18 | Disposition: A | Discharge status: Home / Self Care | Payer: Medicare Other | Source: Ambulatory Visit | Attending: Urology | Admitting: Urology

## 2017-08-18 DIAGNOSIS — N1330 Unspecified hydronephrosis: Secondary | ICD-10-CM | POA: Diagnosis not present

## 2017-08-18 DIAGNOSIS — Z01812 Encounter for preprocedural laboratory examination: Secondary | ICD-10-CM | POA: Diagnosis present

## 2017-08-18 LAB — BASIC METABOLIC PANEL
ANION GAP: 9 (ref 5–15)
BUN: 28 mg/dL — ABNORMAL HIGH (ref 6–20)
CALCIUM: 9.4 mg/dL (ref 8.9–10.3)
CO2: 31 mmol/L (ref 22–32)
CREATININE: 1.44 mg/dL — AB (ref 0.44–1.00)
Chloride: 100 mmol/L — ABNORMAL LOW (ref 101–111)
GFR, EST AFRICAN AMERICAN: 43 mL/min — AB (ref >60)
GFR, EST NON AFRICAN AMERICAN: 37 mL/min — AB (ref >60)
Glucose, Bld: 92 mg/dL (ref 65–99)
Potassium: 4.7 mmol/L (ref 3.5–5.1)
SODIUM: 140 mmol/L (ref 135–145)

## 2017-08-18 LAB — CBC
HEMATOCRIT: 31.2 % — AB (ref 36.0–46.0)
Hemoglobin: 9.9 g/dL — ABNORMAL LOW (ref 12.0–15.0)
MCH: 29.9 pg (ref 26.0–34.0)
MCHC: 31.7 g/dL (ref 30.0–36.0)
MCV: 94.3 fL (ref 78.0–100.0)
PLATELETS: 356 10*3/uL (ref 150–400)
RBC: 3.31 MIL/uL — ABNORMAL LOW (ref 3.87–5.11)
RDW: 18 % — AB (ref 11.5–15.5)
WBC: 9.7 10*3/uL (ref 4.0–10.5)

## 2017-08-18 LAB — GLUCOSE, CAPILLARY: Glucose-Capillary: 82 mg/dL (ref 65–99)

## 2017-08-18 NOTE — Progress Notes (Signed)
08-18-17 At PAT appt, husband verbalized concern about a non-healing blister to the L lateral shin. Husband indicated that it appeared after patient received an injection from her immunologist. They were told to keep it clean, which they have done, but it still have not healed. They are questioning if pt should be on an oral antibiotic. Advised pt's husband to contact the physician to report his concern. He indicated that he would  contact their primary care physician to see if they could assess and treat it.

## 2017-08-21 ENCOUNTER — Other Ambulatory Visit: Payer: Self-pay

## 2017-08-21 ENCOUNTER — Inpatient Hospital Stay (HOSPITAL_COMMUNITY)
Admission: EM | Admit: 2017-08-21 | Discharge: 2017-08-25 | DRG: 682 | Disposition: A | Discharge status: Skilled Nursing Facility | Payer: Medicare Other | Attending: Internal Medicine | Admitting: Internal Medicine

## 2017-08-21 ENCOUNTER — Emergency Department (HOSPITAL_COMMUNITY): Payer: Medicare Other

## 2017-08-21 ENCOUNTER — Encounter (HOSPITAL_COMMUNITY): Payer: Self-pay

## 2017-08-21 DIAGNOSIS — Z8542 Personal history of malignant neoplasm of other parts of uterus: Secondary | ICD-10-CM

## 2017-08-21 DIAGNOSIS — Z794 Long term (current) use of insulin: Secondary | ICD-10-CM

## 2017-08-21 DIAGNOSIS — Z7982 Long term (current) use of aspirin: Secondary | ICD-10-CM

## 2017-08-21 DIAGNOSIS — I10 Essential (primary) hypertension: Secondary | ICD-10-CM | POA: Diagnosis present

## 2017-08-21 DIAGNOSIS — G8929 Other chronic pain: Secondary | ICD-10-CM | POA: Diagnosis present

## 2017-08-21 DIAGNOSIS — G9341 Metabolic encephalopathy: Secondary | ICD-10-CM | POA: Diagnosis present

## 2017-08-21 DIAGNOSIS — F1123 Opioid dependence with withdrawal: Secondary | ICD-10-CM | POA: Diagnosis present

## 2017-08-21 DIAGNOSIS — K219 Gastro-esophageal reflux disease without esophagitis: Secondary | ICD-10-CM | POA: Diagnosis present

## 2017-08-21 DIAGNOSIS — I872 Venous insufficiency (chronic) (peripheral): Secondary | ICD-10-CM | POA: Diagnosis present

## 2017-08-21 DIAGNOSIS — C549 Malignant neoplasm of corpus uteri, unspecified: Secondary | ICD-10-CM | POA: Diagnosis present

## 2017-08-21 DIAGNOSIS — I2510 Atherosclerotic heart disease of native coronary artery without angina pectoris: Secondary | ICD-10-CM | POA: Diagnosis present

## 2017-08-21 DIAGNOSIS — N1330 Unspecified hydronephrosis: Secondary | ICD-10-CM

## 2017-08-21 DIAGNOSIS — G9340 Encephalopathy, unspecified: Secondary | ICD-10-CM | POA: Diagnosis present

## 2017-08-21 DIAGNOSIS — J449 Chronic obstructive pulmonary disease, unspecified: Secondary | ICD-10-CM | POA: Diagnosis present

## 2017-08-21 DIAGNOSIS — R0989 Other specified symptoms and signs involving the circulatory and respiratory systems: Secondary | ICD-10-CM

## 2017-08-21 DIAGNOSIS — Z87891 Personal history of nicotine dependence: Secondary | ICD-10-CM

## 2017-08-21 DIAGNOSIS — Z882 Allergy status to sulfonamides status: Secondary | ICD-10-CM

## 2017-08-21 DIAGNOSIS — Z792 Long term (current) use of antibiotics: Secondary | ICD-10-CM

## 2017-08-21 DIAGNOSIS — M5432 Sciatica, left side: Secondary | ICD-10-CM | POA: Diagnosis present

## 2017-08-21 DIAGNOSIS — Z79899 Other long term (current) drug therapy: Secondary | ICD-10-CM

## 2017-08-21 DIAGNOSIS — R338 Other retention of urine: Secondary | ICD-10-CM | POA: Diagnosis present

## 2017-08-21 DIAGNOSIS — Z888 Allergy status to other drugs, medicaments and biological substances status: Secondary | ICD-10-CM

## 2017-08-21 DIAGNOSIS — M1990 Unspecified osteoarthritis, unspecified site: Secondary | ICD-10-CM | POA: Diagnosis present

## 2017-08-21 DIAGNOSIS — F419 Anxiety disorder, unspecified: Secondary | ICD-10-CM | POA: Diagnosis present

## 2017-08-21 DIAGNOSIS — F1120 Opioid dependence, uncomplicated: Secondary | ICD-10-CM | POA: Diagnosis present

## 2017-08-21 DIAGNOSIS — Z8673 Personal history of transient ischemic attack (TIA), and cerebral infarction without residual deficits: Secondary | ICD-10-CM

## 2017-08-21 DIAGNOSIS — E860 Dehydration: Secondary | ICD-10-CM | POA: Diagnosis present

## 2017-08-21 DIAGNOSIS — L409 Psoriasis, unspecified: Secondary | ICD-10-CM | POA: Diagnosis present

## 2017-08-21 DIAGNOSIS — I5032 Chronic diastolic (congestive) heart failure: Secondary | ICD-10-CM | POA: Diagnosis present

## 2017-08-21 DIAGNOSIS — F329 Major depressive disorder, single episode, unspecified: Secondary | ICD-10-CM | POA: Diagnosis present

## 2017-08-21 DIAGNOSIS — I110 Hypertensive heart disease with heart failure: Secondary | ICD-10-CM | POA: Diagnosis present

## 2017-08-21 DIAGNOSIS — L03116 Cellulitis of left lower limb: Secondary | ICD-10-CM | POA: Diagnosis present

## 2017-08-21 DIAGNOSIS — Z720 Tobacco use: Secondary | ICD-10-CM | POA: Diagnosis present

## 2017-08-21 DIAGNOSIS — I2584 Coronary atherosclerosis due to calcified coronary lesion: Secondary | ICD-10-CM | POA: Diagnosis present

## 2017-08-21 DIAGNOSIS — N179 Acute kidney failure, unspecified: Secondary | ICD-10-CM | POA: Diagnosis not present

## 2017-08-21 DIAGNOSIS — D509 Iron deficiency anemia, unspecified: Secondary | ICD-10-CM | POA: Diagnosis present

## 2017-08-21 DIAGNOSIS — R339 Retention of urine, unspecified: Secondary | ICD-10-CM | POA: Diagnosis present

## 2017-08-21 DIAGNOSIS — E785 Hyperlipidemia, unspecified: Secondary | ICD-10-CM | POA: Diagnosis present

## 2017-08-21 DIAGNOSIS — M5136 Other intervertebral disc degeneration, lumbar region: Secondary | ICD-10-CM | POA: Diagnosis present

## 2017-08-21 DIAGNOSIS — E1151 Type 2 diabetes mellitus with diabetic peripheral angiopathy without gangrene: Secondary | ICD-10-CM | POA: Diagnosis present

## 2017-08-21 DIAGNOSIS — I25118 Atherosclerotic heart disease of native coronary artery with other forms of angina pectoris: Secondary | ICD-10-CM | POA: Diagnosis present

## 2017-08-21 DIAGNOSIS — N3281 Overactive bladder: Secondary | ICD-10-CM | POA: Diagnosis present

## 2017-08-21 DIAGNOSIS — R4182 Altered mental status, unspecified: Secondary | ICD-10-CM

## 2017-08-21 DIAGNOSIS — G92 Toxic encephalopathy: Secondary | ICD-10-CM | POA: Diagnosis present

## 2017-08-21 LAB — CBC
HCT: 28.6 % — ABNORMAL LOW (ref 36.0–46.0)
HEMOGLOBIN: 9.3 g/dL — AB (ref 12.0–15.0)
MCH: 30.3 pg (ref 26.0–34.0)
MCHC: 32.5 g/dL (ref 30.0–36.0)
MCV: 93.2 fL (ref 78.0–100.0)
Platelets: 339 10*3/uL (ref 150–400)
RBC: 3.07 MIL/uL — AB (ref 3.87–5.11)
RDW: 17.2 % — ABNORMAL HIGH (ref 11.5–15.5)
WBC: 16.2 10*3/uL — AB (ref 4.0–10.5)

## 2017-08-21 LAB — COMPREHENSIVE METABOLIC PANEL
ALK PHOS: 50 U/L (ref 38–126)
ALT: 24 U/L (ref 14–54)
ANION GAP: 12 (ref 5–15)
AST: 27 U/L (ref 15–41)
Albumin: 3.5 g/dL (ref 3.5–5.0)
BILIRUBIN TOTAL: 0.6 mg/dL (ref 0.3–1.2)
BUN: 31 mg/dL — ABNORMAL HIGH (ref 6–20)
CALCIUM: 9.2 mg/dL (ref 8.9–10.3)
CO2: 26 mmol/L (ref 22–32)
Chloride: 97 mmol/L — ABNORMAL LOW (ref 101–111)
Creatinine, Ser: 1.66 mg/dL — ABNORMAL HIGH (ref 0.44–1.00)
GFR calc non Af Amer: 31 mL/min — ABNORMAL LOW (ref >60)
GFR, EST AFRICAN AMERICAN: 36 mL/min — AB (ref >60)
Glucose, Bld: 97 mg/dL (ref 65–99)
Potassium: 3.8 mmol/L (ref 3.5–5.1)
SODIUM: 135 mmol/L (ref 135–145)
TOTAL PROTEIN: 7.9 g/dL (ref 6.5–8.1)

## 2017-08-21 LAB — URINALYSIS, ROUTINE W REFLEX MICROSCOPIC
BACTERIA UA: NONE SEEN
Bilirubin Urine: NEGATIVE
Glucose, UA: NEGATIVE mg/dL
Hgb urine dipstick: NEGATIVE
Ketones, ur: NEGATIVE mg/dL
Nitrite: NEGATIVE
PH: 5.5 (ref 5.0–8.0)
Protein, ur: NEGATIVE mg/dL
RBC / HPF: NONE SEEN RBC/hpf (ref 0–5)
SPECIFIC GRAVITY, URINE: 1.005 (ref 1.005–1.030)

## 2017-08-21 LAB — BLOOD GAS, VENOUS
Acid-Base Excess: 3.9 mmol/L — ABNORMAL HIGH (ref 0.0–2.0)
BICARBONATE: 28.6 mmol/L — AB (ref 20.0–28.0)
O2 Saturation: 61.2 %
PH VEN: 7.411 (ref 7.250–7.430)
PO2 VEN: 34.5 mmHg (ref 32.0–45.0)
Patient temperature: 98.6
pCO2, Ven: 45.9 mmHg (ref 44.0–60.0)

## 2017-08-21 LAB — I-STAT TROPONIN, ED: TROPONIN I, POC: 0 ng/mL (ref 0.00–0.08)

## 2017-08-21 LAB — CBG MONITORING, ED
GLUCOSE-CAPILLARY: 88 mg/dL (ref 65–99)
Glucose-Capillary: 77 mg/dL (ref 65–99)

## 2017-08-21 LAB — RAPID URINE DRUG SCREEN, HOSP PERFORMED
Amphetamines: NOT DETECTED
BARBITURATES: NOT DETECTED
BENZODIAZEPINES: NOT DETECTED
COCAINE: NOT DETECTED
Opiates: POSITIVE — AB
TETRAHYDROCANNABINOL: NOT DETECTED

## 2017-08-21 LAB — SALICYLATE LEVEL: Salicylate Lvl: <7 mg/dL (ref 2.8–30.0)

## 2017-08-21 LAB — BRAIN NATRIURETIC PEPTIDE: B Natriuretic Peptide: 125.4 pg/mL — ABNORMAL HIGH (ref 0.0–100.0)

## 2017-08-21 LAB — I-STAT CG4 LACTIC ACID, ED: Lactic Acid, Venous: 0.94 mmol/L (ref 0.5–1.9)

## 2017-08-21 LAB — ACETAMINOPHEN LEVEL: Acetaminophen (Tylenol), Serum: <10 ug/mL — ABNORMAL LOW (ref 10–30)

## 2017-08-21 MED ORDER — DEXTROSE 5 % IV SOLN
1.0000 g | Freq: Once | INTRAVENOUS | Status: AC
  Administered 2017-08-22: 1 g via INTRAVENOUS
  Filled 2017-08-21: qty 10

## 2017-08-21 MED ORDER — IPRATROPIUM-ALBUTEROL 0.5-2.5 (3) MG/3ML IN SOLN
3.0000 mL | Freq: Once | RESPIRATORY_TRACT | Status: AC
Start: 2017-08-21 — End: 2017-08-21
  Administered 2017-08-21: 3 mL via RESPIRATORY_TRACT
  Filled 2017-08-21: qty 3

## 2017-08-21 MED ORDER — OXYCODONE-ACETAMINOPHEN 5-325 MG PO TABS
2.0000 | ORAL_TABLET | Freq: Once | ORAL | Status: AC
  Administered 2017-08-21: 2 via ORAL
  Filled 2017-08-21: qty 2

## 2017-08-21 NOTE — ED Triage Notes (Signed)
Per EMS, patient comes from home (husband on the way), c/o weakness and lethargy. Unable to use walker at home. Pt states she has kidney issues and was going to have a procedure on Monday but doesn't remember the name of it. Hx of UTIs, family states when patient gets like this its usually a UTI. Stroke screen was negative, a&ox4, asks repetative questions and will doze off and talk to self.

## 2017-08-21 NOTE — ED Notes (Signed)
Urine clean catch sent to Lab, no need for in and out cath. Dr. Rex Kras aware patient does not have IV at this time.

## 2017-08-21 NOTE — ED Notes (Signed)
Patient transported to CT 

## 2017-08-21 NOTE — ED Notes (Signed)
Respiratory called and aware of breathing treatment.

## 2017-08-21 NOTE — ED Provider Notes (Signed)
[facility] DEPT Provider Note   CSN: [id number] Arrival date & time: 08/21/17  1707     History   Chief Complaint Chief Complaint  Patient presents with  . Altered Mental Status    HPI Lauren [name] is a 65 y.o. female.  65yo F w/ extensive PMH including COPD, HTN, CAD, T2DM, CVA who p/w altered mental status. EMS picked up patient from home when family members reported generalized weakness and lethargy. Pt has not been able to ambulate with walker today. She has h/o UTIs and family reports she has acted like this before with previous UTI. She is having a urologic procedure in 3 days but she is unsure what it is. Chart review shows cystoscopy and stent placement. She denies chest pain, breathing problems, or abdominal pain. No vomiting.  LEVEL 5 CAVEAT DUE TO AMS   The history is provided by the EMS personnel and the patient.    Past Medical History:  Diagnosis Date  . Adenomatous colon polyp   . Anemia   . Anxiety   . AVM (arteriovenous malformation)    cecum  . CAD (coronary artery disease)   . Carotid artery occlusion   . Cataract    bil cateracts removed  . COPD (chronic obstructive pulmonary disease) ([place])   . Cough   . DDD (degenerative disc disease), lumbar   . Depression   . Diabetes mellitus   . Elevated LFTs   . Endometrial ca [facility])    endometrial ca dx 4/11  . GERD (gastroesophageal reflux disease)   . Gout 2015  . Hernia of flank   . Hydronephrosis   . Hyperlipidemia   . Hypertension   . Insomnia   . OAB (overactive bladder)   . Osteoarthritis   . Peripheral neuropathy    following chemotherapy  . Psoriasis   . Pulmonary nodule    since 2016  . Radiation 05/01/2011-06/11/11   5600 cGy 28 fxs/periaortic  . Stroke ([place])    tia  . Thyroid nodule   . TIA (transient ischemic attack) 2015  . 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 Uterine cancer ([place])   . Ventral hernia    postoperative since 2009    Patient  Active Problem List   Diagnosis Date Noted  . Acute encephalopathy 08/22/2017  . Acute urinary retention 08/22/2017  . Diabetes mellitus due to underlying condition with diabetic peripheral angiopathy without gangrene ([place]) 08/15/2017  . PAD (peripheral artery disease) ([place]) 08/15/2017  . Cor pulmonale, chronic ([place]) 08/15/2017  . Acute on chronic diastolic heart failure ([place]) 08/15/2017  . History of cigarette smoking 08/15/2017  . Multiple pulmonary nodules determined by computed tomography of lung 07/31/2017  . 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 Pressure ulcer of left heel, stage 2 02/04/2017  . Altered mental status   . Community acquired pneumonia   . Encephalopathy   . Fall   . Weakness of both lower extremities   . Other chronic pain   . Narcotic dependence ([place])   . Incontinence of feces   . Sepsis ([place]) 02/02/2017  . Carotid stenosis, bilateral 01/30/2017  . Aftercare following surgery of the circulatory system 01/30/2017  . Impingement syndrome of left shoulder 12/17/2016  . Coronary artery calcification of native artery 10/07/2016  . Iron (Fe) deficiency anemia 10/07/2016  . DDD (degenerative disc disease), lumbar 10/03/2016  . Fatigue 10/03/2016  . Intervertebral disc disease 10/03/2016  . Low back pain 10/03/2016  .  Myelopathy ([place]) 10/03/2016  . Prolapsed lumbosacral intervertebral disc 10/03/2016  . Spondylisthesis 10/03/2016  . MRSA (methicillin resistant staph aureus) culture positive 10/10/2015  . Carotid stenosis 08/17/2015  . Preoperative cardiovascular examination 08/10/2015  . Tobacco abuse 08/10/2015  . 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 Acute osteomyelitis of spine ([place]) 08/28/2014  . Difficult or painful urination 08/28/2014  . H/O Spinal surgery 08/28/2014  . Elevated WBC count 08/28/2014  . Encounter for aftercare for long-term (current) use of antibiotics 08/28/2014  . N&V (nausea and vomiting) 08/28/2014  . Bladder retention  08/28/2014  . Infection of urinary tract 08/28/2014  . DM2 (diabetes mellitus, type 2) ([place]) 06/15/2013  . Ventral hernia 06/15/2013  . COPD GOLD III/IV 06/15/2013  . DJD (degenerative joint disease) of lumbar spine 06/15/2013  . Depression 06/15/2013  . Exertional angina ([place]) 06/15/2013  . Preoperative evaluation 06/15/2013  . HTN (hypertension) 06/15/2013  . Occlusion and stenosis of carotid artery without mention of cerebral infarction 03/02/2012  . Malignant neoplasm of corpus uteri, except isthmus ([place]) 01/19/2012  . Abnormal EKG 12/07/2011  . Essential hypertension, benign 12/07/2011  . Mixed hyperlipidemia 12/07/2011  . Carotid artery disease without cerebral infarction ([place]) 12/07/2011    Past Surgical History:  Procedure Laterality Date  . ABDOMINAL HYSTERECTOMY  2009   for ca  . BACK SURGERY  Aug/sept 2014   3 back surgeries  (Aug 2014 & Sept 2014)  . CHOLECYSTECTOMY     biliteral  . COLONOSCOPY  11/13/2014   negative except for a small cecal AVM and 1 polyp  . ENDARTERECTOMY Right 08/17/2015   Procedure: Right Carotid Endartarectomy with patch Angioplasty ;  Surgeon: Mal Misty, MD;  Location: [facility];  Service: Vascular;  Laterality: Right;  . EYE SURGERY Right Aug. 2013   Cataract, lens implant  . EYE SURGERY Left Sept. 2013   Cataract, lens implant  . I & D of lumbar fusion /removal of hardware for chronic infection  Jan. 8, 2015  . right carotid endarterectomy  2016  . SPINE SURGERY      OB History    No data available       Home Medications    Prior to Admission medications   Medication Sig Start Date End Date Taking? Authorizing Provider  Albuterol Sulfate (PROAIR RESPICLICK) 272 (90 Base) MCG/ACT AEPB Inhale 2 puffs into the lungs 4 (four) times daily as needed (congestion, cough, wheezing).   Yes [provider]  aspirin EC 81 MG tablet Take 81 mg by mouth daily.   Yes [provider]  betamethasone dipropionate (DIPROLENE)  0.05 % ointment Apply 1 application topically 2 (two) times daily as needed (psoriasis).    Yes [provider]  Cholecalciferol (VITAMIN D3) 1000 UNITS CAPS Take 1,000 Units by mouth daily.    Yes [provider]  doxycycline (VIBRA-TABS) 100 MG tablet Take 100 mg by mouth 2 (two) times daily. 08/18/17  Yes [provider]  escitalopram (LEXAPRO) 20 MG tablet Take 20 mg by mouth daily.   Yes [provider]  esomeprazole (NEXIUM) 40 MG capsule Take 40 mg by mouth daily.    Yes [provider]  ferrous sulfate 325 (65 FE) MG tablet Take 325 mg by mouth daily.    Yes [provider]  Fluticasone-Salmeterol (ADVAIR) 250-50 MCG/DOSE AEPB Inhale 1 puff into the lungs 2 (two) times daily as needed (shortness of breath).    Yes [provider]  folic acid (FOLVITE) 1 MG tablet  Take 1 mg by mouth daily.   Yes [provider]  furosemide (LASIX) 40 MG tablet Take 1 tablet (40 mg total) by mouth 2 (two) times daily. 08/14/17  Yes Croitoru, Mihai, MD  gabapentin (NEURONTIN) 600 MG tablet Take 600 mg by mouth 6 (six) times daily. 6am, 10am, 4pm, 6pm, 10pm, midnight   Yes [provider]  insulin glargine (LANTUS) 100 UNIT/ML injection Inject 45 Units into the skin at bedtime.    Yes [provider]  losartan (COZAAR) 100 MG tablet Take 100 mg by mouth daily.     Yes [provider]  magnesium oxide (MAG-OX) 400 MG tablet Take 400 mg by mouth daily.   Yes [provider]  Melatonin 10 MG TABS Take 10 mg by mouth at bedtime.   Yes [provider]  metaxalone (SKELAXIN) 800 MG tablet Take 800 mg by mouth 3 (three) times daily. 6am, noon, 6pm   Yes [provider]  metoprolol succinate (TOPROL-XL) 50 MG 24 hr tablet Take 50 mg by mouth daily. 11/21/16  Yes [provider]  mirabegron ER (MYRBETRIQ) 50 MG TB24 tablet Take 50 mg by mouth daily.   Yes [provider]  Multiple  Vitamin (MULTIVITAMIN WITH MINERALS) TABS tablet Take 1 tablet by mouth daily.   Yes [provider]  mupirocin ointment (BACTROBAN) 2 % Apply 1 application topically daily. 08/18/17  Yes [provider]  nitrofurantoin (MACRODANTIN) 100 MG capsule Take 100 mg by mouth at bedtime.    Yes [provider]  nortriptyline (PAMELOR) 10 MG capsule Take 10 mg by mouth at bedtime.    Yes [provider]  nystatin (MYCOSTATIN) 100000 UNIT/ML suspension Take 5 mLs by mouth 4 (four) times daily. Swish and swallow 08/10/17  Yes [provider]  ondansetron (ZOFRAN-ODT) 4 MG disintegrating tablet Take 4 mg by mouth every 8 (eight) hours as needed for nausea or vomiting.  09/16/16  Yes [provider]  OXYCONTIN 15 MG 12 hr tablet Take 15 mg by mouth every 12 (twelve) hours. 08/07/17  Yes [provider]  potassium chloride SA (KLOR-CON M20) 20 MEQ tablet Take 1 tablet (20 mEq total) by mouth 2 (two) times daily. 08/14/17 11/12/17 Yes Croitoru, Mihai, MD  rosuvastatin (CRESTOR) 5 MG tablet Take 5 mg by mouth at bedtime. 01/13/17  Yes [provider]  tapentadol (NUCYNTA) 50 MG tablet Take 50 mg by mouth every 6 (six) hours. 6 am, noon, 6pm  And midnight   Yes [provider]  VESICARE 10 MG tablet Take 10 mg by mouth daily. 08/07/17  Yes [provider]  vitamin B-12 (CYANOCOBALAMIN) 1000 MCG tablet Take 1,000 mcg by mouth daily.   Yes [provider]  glucose blood (ACCU-CHEK AVIVA PLUS) test strip 1 each by Other route as needed. Use as instructed     [provider]  nitroGLYCERIN (NITROSTAT) 0.4 MG SL tablet Place 1 tablet (0.4 mg total) under the tongue every 5 (five) minutes as needed for chest pain. 06/15/13   Croitoru, Dani Gobble, MD    Family History Family History  Problem Relation Age of Onset  . Stroke Mother   . Irritable bowel syndrome Mother   . Heart attack Father 59       died  . Heart disease  Father        Heart Disease before age 48, rheumatic heart disease  . Hyperlipidemia Father   . Hypertension Father   . Diabetes Daughter   .  Hypertension Daughter   . Irritable bowel syndrome Daughter   . Colon cancer Neg Hx   . Esophageal cancer Neg Hx   . Pancreatic cancer Neg Hx   . Rectal cancer Neg Hx   . Stomach cancer Neg Hx   . Neuropathy Neg Hx     Social History Social History  Substance Use Topics  . Smoking status: Former Smoker    Packs/day: 0.10    Years: 40.00    Types: Cigarettes    Quit date: 01/2017  . Smokeless tobacco: Never Used  . Alcohol use No     Allergies   Ambien [zolpidem tartrate]; Sulfa antibiotics; Elavil [amitriptyline hcl]; and Prozac [fluoxetine hcl]   Review of Systems Review of Systems  Unable to perform ROS: Mental status change     Physical Exam Updated Vital Signs BP (!) 151/63 (BP Location: Right Arm)   Pulse 86   Temp 98.4 F (36.9 C) (Oral)   Resp 18   Ht 5\' 6"  (1.676 m)   Wt 88.5 kg (195 lb)   SpO2 97%   BMI 31.47 kg/m   Physical Exam  Constitutional: She appears well-developed and well-nourished.  Sleepy but easily arouses to voice, comfortable  HENT:  Head: Normocephalic and atraumatic.  dry mucous membranes  Eyes: Pupils are equal, round, and reactive to light. Conjunctivae are normal.  Neck: Neck supple.  Cardiovascular: Normal rate, regular rhythm and normal heart sounds.   No murmur heard. Pulmonary/Chest: Effort normal. She has wheezes.  Diminished BS b/l with expiratory wheezes  Abdominal: Soft. Bowel sounds are normal. She exhibits no distension. There is no tenderness.  Large ventral hernia  Musculoskeletal: She exhibits edema (1+ pitting BLE R>L leg).  Neurological:  Sleepy, able to answer a few questions but falls back asleep quickly, no facial asymmetry  Skin: Skin is warm and dry.  Nursing note and vitals reviewed.    ED Treatments / Results  Labs (all labs ordered are listed, but only  abnormal results are displayed) Labs Reviewed  COMPREHENSIVE METABOLIC PANEL - Abnormal; Notable for the following:       Result Value   Chloride 97 (*)    BUN 31 (*)    Creatinine, Ser 1.66 (*)    GFR calc non Af Amer 31 (*)    GFR calc Af Amer 36 (*)    All other components within normal limits  CBC - Abnormal; Notable for the following:    WBC 16.2 (*)    RBC 3.07 (*)    Hemoglobin 9.3 (*)    HCT 28.6 (*)    RDW 17.2 (*)    All other components within normal limits  URINALYSIS, ROUTINE W REFLEX MICROSCOPIC - Abnormal; Notable for the following:    Leukocytes, UA SMALL (*)    Squamous Epithelial / LPF 0-5 (*)    All other components within normal limits  BLOOD GAS, VENOUS - Abnormal; Notable for the following:    Bicarbonate 28.6 (*)    Acid-Base Excess 3.9 (*)    All other components within normal limits  RAPID URINE DRUG SCREEN, HOSP PERFORMED - Abnormal; Notable for the following:    Opiates POSITIVE (*)    All other components within normal limits  ACETAMINOPHEN LEVEL - Abnormal; Notable for the following:    Acetaminophen (Tylenol), Serum <10 (*)    All other components within normal limits  BRAIN NATRIURETIC PEPTIDE - Abnormal; Notable for the following:    B Natriuretic Peptide 125.4 (*)  All other components within normal limits  URINE CULTURE  SALICYLATE LEVEL  I-STAT TROPONIN, ED  I-STAT CG4 LACTIC ACID, ED  CBG MONITORING, ED  CBG MONITORING, ED    EKG  EKG Interpretation None       Radiology Dg Chest 2 View  Result Date: 08/21/2017 CLINICAL DATA:  Weakness, lethargy. EXAM: CHEST  2 VIEW COMPARISON:  Radiographs of July 22, 2017. FINDINGS: The heart size and mediastinal contours are within normal limits. Both lungs are clear. No pneumothorax or pleural effusion is noted. The visualized skeletal structures are unremarkable. IMPRESSION: No active cardiopulmonary disease. Electronically Signed   By: Marijo Conception, M.D.   On: 08/21/2017 18:16   Ct  Head Wo Contrast  Result Date: 08/21/2017 CLINICAL DATA:  Altered level of consciousness EXAM: CT HEAD WITHOUT CONTRAST TECHNIQUE: Contiguous axial images were obtained from the base of the skull through the vertex without intravenous contrast. COMPARISON:  02/02/2017 FINDINGS: Brain: No evidence of acute infarction, hemorrhage, hydrocephalus, extra-axial collection or mass lesion/mass effect. Subcortical white matter and periventricular small vessel ischemic changes. Vascular: Intracranial atherosclerosis. Skull: Normal. Negative for fracture or focal lesion. Sinuses/Orbits: The visualized paranasal sinuses are essentially clear. The mastoid air cells are unopacified. Other: None. IMPRESSION: No evidence of acute intracranial abnormality. Small vessel ischemic changes. Electronically Signed   By: Julian Hy M.D.   On: 08/21/2017 18:19   Ct Renal Stone Study  Result Date: 08/21/2017 CLINICAL DATA:  Flank pain.  Weakness and lethargy. EXAM: CT ABDOMEN AND PELVIS WITHOUT CONTRAST TECHNIQUE: Multidetector CT imaging of the abdomen and pelvis was performed following the standard protocol without IV contrast. COMPARISON:  CT 08/04/2017 FINDINGS: Lower chest: Mild breathing motion artifact. No consolidation. Minimal right lung base atelectasis. Hepatobiliary: The liver is prominent size spanning 18.7 cm cranial caudal. No focal hepatic lesion allowing for lack contrast. Mild capsular nodularity. Postcholecystectomy with unchanged biliary prominence from prior exam. No calcified choledocholithiasis. Pancreas: Parenchymal atrophy. No ductal dilatation or inflammation. Spleen: Normal in size without focal abnormality. Adrenals/Urinary Tract: No adrenal nodule. Moderate to severe right hydronephrosis and hydroureter with ureteric tortuosity, similar in appearance to prior exam. Mild thinning of the renal parenchyma. No urolithiasis or cause of obstruction. Moderate left hydronephrosis, ureteral distension and  tortuosity, new from prior exam. No stones or cause of obstruction. The urinary bladder is distended.  No bladder wall thickening. Stomach/Bowel: Large midline lower ventral abdominal wall hernia with diastasis recti. Hernia contains transverse colon and small bowel. Distal portion of stomach also extends into the upper portion of the abdominal wall defect. There is no bowel obstruction, inflammation or wall thickening. Colonic tortuosity with moderate colonic stool burden, similar to prior exam. Normal appendix. Vascular/Lymphatic: Dense aortic atherosclerosis. No aneurysm. No adenopathy. Reproductive: Status post hysterectomy. No adnexal masses. Other: No free air, free fluid, or intra-abdominal fluid collection. Musculoskeletal: Degenerative and postsurgical change in the lumbar spine. Chronic anterolisthesis of L4 on L5 and chronic bony remottling L5, unchanged from prior exam and lumbar spine myelogram 04/15/2016. IMPRESSION: 1. New moderate left hydroureteronephrosis without identifiable cause. Ureter is dilated throughout its course. Mild increase in right hydroureteronephrosis from prior exam. Urinary bladder is distended. No urolithiasis. The cause of urinary obstruction is unclear. Consider cystoscopy. 2. Large midline lower ventral abdominal wall hernia and diastasis recti containing colon, small bowel and portions of the stomach, no obstruction or inflammation. 3. Nodular hepatic contours raise concern for cirrhosis. Recommend correlation with cirrhosis risk factors. 4.  Aortic Atherosclerosis (ICD10-I70.0). Electronically  Signed   By: Jeb Levering M.D.   On: 08/21/2017 22:29    Procedures Procedures (including critical care time)  Medications Ordered in ED Medications  cefTRIAXone (ROCEPHIN) 1 g in dextrose 5 % 50 mL IVPB (not administered)  ipratropium-albuterol (DUONEB) 0.5-2.5 (3) MG/3ML nebulizer solution 3 mL (3 mLs Nebulization Given 08/21/17 1843)  oxyCODONE-acetaminophen  (PERCOCET/ROXICET) 5-325 MG per tablet 2 tablet (2 tablets Oral Given 08/21/17 2129)     Initial Impression / Assessment and Plan / ED Course  I have reviewed the triage vital signs and the nursing notes.  Pertinent labs & imaging results that were available during my care of the patient were reviewed by me and considered in my medical decision making (see chart for details).    PT brought in by EMS for altered mental status. On arrival, she was sleepy and Difficult to keep awake during conversation but no acute distress. Vital signs stable. She denied any abdominal pain, chest pain, or breathing problems. Obtained above lab work which showed worsening kidney function at 1.6, normal lactate, and WBC 16.2, normal VBG, negative Tylenol and salicylate. UDS positive only for opiates for which she has prescription. Husband later arrived and I discussed symptoms at length with him. He denies any possibility of overdose on medications because he helps to administer them.   Chest x-ray negative acute. Because of the patient's worsening kidney function and white count, obtained CT of abdomen and pelvis. CT shows worsening right hydronephrosis and new left hydronephrosis. Bladder scan shows greater than 700 mL of urine. I contacted urology and discussed with Dr. Matilde Sprang, who recommended foley placement and coverage w/ antibiotics even though urine not obviously infected. Because of worsening kidney function, discussed admission w/ [facility], Dr. Roel Cluck and pt admitted for further care. Final Clinical Impressions(s) / ED Diagnoses   Final diagnoses:  None    New Prescriptions New Prescriptions   No medications on file     Angella Montas, Wenda Overland, MD 08/22/17 [phone]

## 2017-08-21 NOTE — ED Notes (Signed)
Dr. Rex Kras updated regarding difficulty getting labs/IV.

## 2017-08-22 ENCOUNTER — Observation Stay (HOSPITAL_BASED_OUTPATIENT_CLINIC_OR_DEPARTMENT_OTHER): Payer: Medicare Other

## 2017-08-22 ENCOUNTER — Observation Stay (HOSPITAL_COMMUNITY): Payer: Medicare Other

## 2017-08-22 ENCOUNTER — Encounter (HOSPITAL_COMMUNITY): Payer: Self-pay | Admitting: Internal Medicine

## 2017-08-22 DIAGNOSIS — E860 Dehydration: Secondary | ICD-10-CM | POA: Diagnosis present

## 2017-08-22 DIAGNOSIS — I10 Essential (primary) hypertension: Secondary | ICD-10-CM

## 2017-08-22 DIAGNOSIS — I2584 Coronary atherosclerosis due to calcified coronary lesion: Secondary | ICD-10-CM

## 2017-08-22 DIAGNOSIS — N1339 Other hydronephrosis: Secondary | ICD-10-CM | POA: Diagnosis not present

## 2017-08-22 DIAGNOSIS — N1330 Unspecified hydronephrosis: Secondary | ICD-10-CM | POA: Diagnosis present

## 2017-08-22 DIAGNOSIS — M5432 Sciatica, left side: Secondary | ICD-10-CM | POA: Diagnosis present

## 2017-08-22 DIAGNOSIS — G9340 Encephalopathy, unspecified: Secondary | ICD-10-CM | POA: Diagnosis present

## 2017-08-22 DIAGNOSIS — Z794 Long term (current) use of insulin: Secondary | ICD-10-CM

## 2017-08-22 DIAGNOSIS — N179 Acute kidney failure, unspecified: Secondary | ICD-10-CM | POA: Diagnosis present

## 2017-08-22 DIAGNOSIS — I2510 Atherosclerotic heart disease of native coronary artery without angina pectoris: Secondary | ICD-10-CM | POA: Diagnosis present

## 2017-08-22 DIAGNOSIS — E1159 Type 2 diabetes mellitus with other circulatory complications: Secondary | ICD-10-CM

## 2017-08-22 DIAGNOSIS — F1123 Opioid dependence with withdrawal: Secondary | ICD-10-CM | POA: Diagnosis present

## 2017-08-22 DIAGNOSIS — J449 Chronic obstructive pulmonary disease, unspecified: Secondary | ICD-10-CM | POA: Diagnosis present

## 2017-08-22 DIAGNOSIS — M5136 Other intervertebral disc degeneration, lumbar region: Secondary | ICD-10-CM | POA: Diagnosis present

## 2017-08-22 DIAGNOSIS — E785 Hyperlipidemia, unspecified: Secondary | ICD-10-CM | POA: Diagnosis present

## 2017-08-22 DIAGNOSIS — L409 Psoriasis, unspecified: Secondary | ICD-10-CM | POA: Diagnosis present

## 2017-08-22 DIAGNOSIS — R338 Other retention of urine: Secondary | ICD-10-CM | POA: Diagnosis not present

## 2017-08-22 DIAGNOSIS — F1120 Opioid dependence, uncomplicated: Secondary | ICD-10-CM

## 2017-08-22 DIAGNOSIS — C549 Malignant neoplasm of corpus uteri, unspecified: Secondary | ICD-10-CM

## 2017-08-22 DIAGNOSIS — R339 Retention of urine, unspecified: Secondary | ICD-10-CM | POA: Diagnosis present

## 2017-08-22 DIAGNOSIS — E0851 Diabetes mellitus due to underlying condition with diabetic peripheral angiopathy without gangrene: Secondary | ICD-10-CM

## 2017-08-22 DIAGNOSIS — I110 Hypertensive heart disease with heart failure: Secondary | ICD-10-CM | POA: Diagnosis present

## 2017-08-22 DIAGNOSIS — I872 Venous insufficiency (chronic) (peripheral): Secondary | ICD-10-CM | POA: Diagnosis present

## 2017-08-22 DIAGNOSIS — F419 Anxiety disorder, unspecified: Secondary | ICD-10-CM | POA: Diagnosis present

## 2017-08-22 DIAGNOSIS — G9341 Metabolic encephalopathy: Secondary | ICD-10-CM | POA: Diagnosis present

## 2017-08-22 DIAGNOSIS — G92 Toxic encephalopathy: Secondary | ICD-10-CM | POA: Diagnosis present

## 2017-08-22 DIAGNOSIS — R609 Edema, unspecified: Secondary | ICD-10-CM

## 2017-08-22 DIAGNOSIS — G8929 Other chronic pain: Secondary | ICD-10-CM | POA: Diagnosis present

## 2017-08-22 DIAGNOSIS — K219 Gastro-esophageal reflux disease without esophagitis: Secondary | ICD-10-CM | POA: Diagnosis present

## 2017-08-22 DIAGNOSIS — L03116 Cellulitis of left lower limb: Secondary | ICD-10-CM | POA: Diagnosis present

## 2017-08-22 DIAGNOSIS — M1990 Unspecified osteoarthritis, unspecified site: Secondary | ICD-10-CM | POA: Diagnosis present

## 2017-08-22 DIAGNOSIS — D509 Iron deficiency anemia, unspecified: Secondary | ICD-10-CM | POA: Diagnosis present

## 2017-08-22 DIAGNOSIS — I5032 Chronic diastolic (congestive) heart failure: Secondary | ICD-10-CM | POA: Diagnosis present

## 2017-08-22 DIAGNOSIS — E1151 Type 2 diabetes mellitus with diabetic peripheral angiopathy without gangrene: Secondary | ICD-10-CM | POA: Diagnosis present

## 2017-08-22 LAB — PHOSPHORUS: PHOSPHORUS: 4.6 mg/dL (ref 2.5–4.6)

## 2017-08-22 LAB — ECHOCARDIOGRAM COMPLETE
HEIGHTINCHES: 66 in
WEIGHTICAEL: 3164.04 [oz_av]

## 2017-08-22 LAB — RETICULOCYTES
RBC.: 3.24 MIL/uL — AB (ref 3.87–5.11)
RETIC COUNT ABSOLUTE: 51.8 10*3/uL (ref 19.0–186.0)
Retic Ct Pct: 1.6 % (ref 0.4–3.1)

## 2017-08-22 LAB — GLUCOSE, CAPILLARY
GLUCOSE-CAPILLARY: 103 mg/dL — AB (ref 65–99)
GLUCOSE-CAPILLARY: 244 mg/dL — AB (ref 65–99)
Glucose-Capillary: 95 mg/dL (ref 65–99)
Glucose-Capillary: 178 mg/dL — ABNORMAL HIGH (ref 65–99)
Glucose-Capillary: 190 mg/dL — ABNORMAL HIGH (ref 65–99)
Glucose-Capillary: 227 mg/dL — ABNORMAL HIGH (ref 65–99)

## 2017-08-22 LAB — FOLATE: FOLATE: 22.5 ng/mL (ref >5.9)

## 2017-08-22 LAB — COMPREHENSIVE METABOLIC PANEL
ALT: 21 U/L (ref 14–54)
ANION GAP: 11 (ref 5–15)
AST: 24 U/L (ref 15–41)
Albumin: 2.9 g/dL — ABNORMAL LOW (ref 3.5–5.0)
Alkaline Phosphatase: 45 U/L (ref 38–126)
BUN: 30 mg/dL — ABNORMAL HIGH (ref 6–20)
CHLORIDE: 97 mmol/L — AB (ref 101–111)
CO2: 30 mmol/L (ref 22–32)
Calcium: 9.3 mg/dL (ref 8.9–10.3)
Creatinine, Ser: 1.65 mg/dL — ABNORMAL HIGH (ref 0.44–1.00)
GFR, EST AFRICAN AMERICAN: 36 mL/min — AB (ref >60)
GFR, EST NON AFRICAN AMERICAN: 31 mL/min — AB (ref >60)
Glucose, Bld: 108 mg/dL — ABNORMAL HIGH (ref 65–99)
POTASSIUM: 3.3 mmol/L — AB (ref 3.5–5.1)
Sodium: 138 mmol/L (ref 135–145)
Total Bilirubin: 0.6 mg/dL (ref 0.3–1.2)
Total Protein: 7.5 g/dL (ref 6.5–8.1)

## 2017-08-22 LAB — IRON AND TIBC
Iron: 19 ug/dL — ABNORMAL LOW (ref 28–170)
Saturation Ratios: 6 % — ABNORMAL LOW (ref 10.4–31.8)
TIBC: 307 ug/dL (ref 250–450)
UIBC: 288 ug/dL

## 2017-08-22 LAB — HEMOGLOBIN A1C
HEMOGLOBIN A1C: 5.9 % — AB (ref 4.8–5.6)
MEAN PLASMA GLUCOSE: 122.63 mg/dL

## 2017-08-22 LAB — PROTIME-INR
INR: 1.14
Prothrombin Time: 14.7 seconds (ref 11.4–15.2)

## 2017-08-22 LAB — CBC
HEMATOCRIT: 30.3 % — AB (ref 36.0–46.0)
Hemoglobin: 9.6 g/dL — ABNORMAL LOW (ref 12.0–15.0)
MCH: 29.6 pg (ref 26.0–34.0)
MCHC: 31.7 g/dL (ref 30.0–36.0)
MCV: 93.5 fL (ref 78.0–100.0)
PLATELETS: 442 10*3/uL — AB (ref 150–400)
RBC: 3.24 MIL/uL — AB (ref 3.87–5.11)
RDW: 17.2 % — ABNORMAL HIGH (ref 11.5–15.5)
WBC: 12.6 10*3/uL — AB (ref 4.0–10.5)

## 2017-08-22 LAB — MAGNESIUM: Magnesium: 1.5 mg/dL — ABNORMAL LOW (ref 1.7–2.4)

## 2017-08-22 LAB — VITAMIN B12: VITAMIN B 12: 857 pg/mL (ref 180–914)

## 2017-08-22 LAB — TSH: TSH: 1.517 u[IU]/mL (ref 0.350–4.500)

## 2017-08-22 LAB — AMMONIA: Ammonia: 9 umol/L (ref 9–35)

## 2017-08-22 LAB — FERRITIN: Ferritin: 150 ng/mL (ref 11–307)

## 2017-08-22 MED ORDER — INSULIN GLARGINE 100 UNIT/ML ~~LOC~~ SOLN
30.0000 [IU] | Freq: Every day | SUBCUTANEOUS | Status: DC
  Administered 2017-08-23 – 2017-08-24 (×2): 30 [IU] via SUBCUTANEOUS
  Filled 2017-08-22 (×3): qty 0.3

## 2017-08-22 MED ORDER — SENNA 8.6 MG PO TABS
1.0000 | ORAL_TABLET | Freq: Two times a day (BID) | ORAL | Status: DC
  Administered 2017-08-22 – 2017-08-25 (×8): 8.6 mg via ORAL
  Filled 2017-08-22 (×8): qty 1

## 2017-08-22 MED ORDER — POLYETHYLENE GLYCOL 3350 17 G PO PACK
17.0000 g | PACK | Freq: Two times a day (BID) | ORAL | Status: DC
  Administered 2017-08-22 – 2017-08-25 (×8): 17 g via ORAL
  Filled 2017-08-22 (×8): qty 1

## 2017-08-22 MED ORDER — ENOXAPARIN SODIUM 40 MG/0.4ML ~~LOC~~ SOLN
40.0000 mg | SUBCUTANEOUS | Status: DC
  Administered 2017-08-22 – 2017-08-24 (×3): 40 mg via SUBCUTANEOUS
  Filled 2017-08-22 (×3): qty 0.4

## 2017-08-22 MED ORDER — ONDANSETRON HCL 4 MG/2ML IJ SOLN: 4.0000 mg | Freq: Four times a day (QID) | INTRAMUSCULAR | Status: DC | PRN

## 2017-08-22 MED ORDER — HYDROCODONE-ACETAMINOPHEN 5-325 MG PO TABS
1.0000 | ORAL_TABLET | ORAL | Status: DC | PRN
  Administered 2017-08-22 – 2017-08-25 (×8): 2 via ORAL
  Filled 2017-08-22 (×9): qty 2

## 2017-08-22 MED ORDER — ESCITALOPRAM OXALATE 10 MG PO TABS
20.0000 mg | ORAL_TABLET | Freq: Every day | ORAL | Status: DC
  Administered 2017-08-22 – 2017-08-25 (×4): 20 mg via ORAL
  Filled 2017-08-22 (×4): qty 2

## 2017-08-22 MED ORDER — POLYETHYLENE GLYCOL 3350 17 G PO PACK: 17.0000 g | PACK | Freq: Every day | ORAL | Status: DC | PRN

## 2017-08-22 MED ORDER — INSULIN ASPART 100 UNIT/ML ~~LOC~~ SOLN
0.0000 [IU] | SUBCUTANEOUS | Status: DC
  Administered 2017-08-22: 2 [IU] via SUBCUTANEOUS
  Administered 2017-08-22: 3 [IU] via SUBCUTANEOUS
  Administered 2017-08-22: 2 [IU] via SUBCUTANEOUS
  Administered 2017-08-23: 3 [IU] via SUBCUTANEOUS
  Administered 2017-08-23: 2 [IU] via SUBCUTANEOUS
  Administered 2017-08-23 (×3): 1 [IU] via SUBCUTANEOUS
  Administered 2017-08-24: 3 [IU] via SUBCUTANEOUS
  Administered 2017-08-24 – 2017-08-25 (×4): 1 [IU] via SUBCUTANEOUS

## 2017-08-22 MED ORDER — FERROUS SULFATE 325 (65 FE) MG PO TABS
325.0000 mg | ORAL_TABLET | Freq: Three times a day (TID) | ORAL | Status: DC
  Administered 2017-08-22 – 2017-08-25 (×10): 325 mg via ORAL
  Filled 2017-08-22 (×9): qty 1

## 2017-08-22 MED ORDER — SODIUM CHLORIDE 0.9 % IV SOLN
INTRAVENOUS | Status: DC
  Administered 2017-08-22: 04:00:00 via INTRAVENOUS

## 2017-08-22 MED ORDER — OXYCODONE HCL ER 15 MG PO T12A
15.0000 mg | EXTENDED_RELEASE_TABLET | Freq: Two times a day (BID) | ORAL | Status: DC
  Administered 2017-08-22 – 2017-08-25 (×7): 15 mg via ORAL
  Filled 2017-08-22 (×7): qty 1

## 2017-08-22 MED ORDER — POTASSIUM CHLORIDE CRYS ER 20 MEQ PO TBCR
40.0000 meq | EXTENDED_RELEASE_TABLET | Freq: Once | ORAL | Status: AC
  Administered 2017-08-22: 40 meq via ORAL
  Filled 2017-08-22: qty 2

## 2017-08-22 MED ORDER — METOPROLOL SUCCINATE ER 50 MG PO TB24
50.0000 mg | ORAL_TABLET | Freq: Every day | ORAL | Status: DC
  Administered 2017-08-22 – 2017-08-25 (×4): 50 mg via ORAL
  Filled 2017-08-22 (×4): qty 1

## 2017-08-22 MED ORDER — HYDRALAZINE HCL 20 MG/ML IJ SOLN
10.0000 mg | INTRAMUSCULAR | Status: DC | PRN
  Administered 2017-08-22 – 2017-08-25 (×3): 10 mg via INTRAVENOUS
  Filled 2017-08-22 (×3): qty 0.5

## 2017-08-22 MED ORDER — ACETAMINOPHEN 650 MG RE SUPP: 650.0000 mg | Freq: Four times a day (QID) | RECTAL | Status: DC | PRN

## 2017-08-22 MED ORDER — GUAIFENESIN ER 600 MG PO TB12
600.0000 mg | ORAL_TABLET | Freq: Two times a day (BID) | ORAL | Status: DC
  Administered 2017-08-22 – 2017-08-25 (×8): 600 mg via ORAL
  Filled 2017-08-22 (×8): qty 1

## 2017-08-22 MED ORDER — ACETAMINOPHEN 325 MG PO TABS: 650.0000 mg | ORAL_TABLET | Freq: Four times a day (QID) | ORAL | Status: DC | PRN

## 2017-08-22 MED ORDER — PANTOPRAZOLE SODIUM 40 MG PO TBEC
40.0000 mg | DELAYED_RELEASE_TABLET | Freq: Every day | ORAL | Status: DC
  Administered 2017-08-22 – 2017-08-25 (×4): 40 mg via ORAL
  Filled 2017-08-22 (×4): qty 1

## 2017-08-22 MED ORDER — DEXTROSE 5 % IV SOLN
1.0000 g | INTRAVENOUS | Status: DC
  Administered 2017-08-22 – 2017-08-24 (×3): 1 g via INTRAVENOUS
  Filled 2017-08-22 (×4): qty 10

## 2017-08-22 MED ORDER — ASPIRIN EC 81 MG PO TBEC
81.0000 mg | DELAYED_RELEASE_TABLET | Freq: Every day | ORAL | Status: DC
  Administered 2017-08-22 – 2017-08-25 (×4): 81 mg via ORAL
  Filled 2017-08-22 (×4): qty 1

## 2017-08-22 MED ORDER — ALBUTEROL SULFATE (2.5 MG/3ML) 0.083% IN NEBU
2.5000 mg | INHALATION_SOLUTION | RESPIRATORY_TRACT | Status: DC | PRN
  Administered 2017-08-25: 2.5 mg via RESPIRATORY_TRACT
  Filled 2017-08-22: qty 3

## 2017-08-22 MED ORDER — BISACODYL 10 MG RE SUPP: 10.0000 mg | Freq: Every day | RECTAL | Status: DC | PRN

## 2017-08-22 MED ORDER — ROSUVASTATIN CALCIUM 5 MG PO TABS
5.0000 mg | ORAL_TABLET | Freq: Every day | ORAL | Status: DC
  Administered 2017-08-22 – 2017-08-24 (×3): 5 mg via ORAL
  Filled 2017-08-22 (×3): qty 1

## 2017-08-22 MED ORDER — ONDANSETRON HCL 4 MG PO TABS
4.0000 mg | ORAL_TABLET | Freq: Four times a day (QID) | ORAL | Status: DC | PRN
  Administered 2017-08-24: 4 mg via ORAL
  Filled 2017-08-22 (×2): qty 1

## 2017-08-22 NOTE — Progress Notes (Signed)
OT Cancellation Note  Patient Details Name: Lauren [name] MRN: [id number] DOB: Nov 18, 1951   Cancelled Treatment:    Reason Eval/Treat Not Completed: Medical issues which prohibited therapy. Awaiting doppler of bil LEs to r/o DVT. Will check back  [name] 08/22/2017, 1:06 PM  Lesle Chris, OTR/L [phone] 08/22/2017

## 2017-08-22 NOTE — Progress Notes (Signed)
PROGRESS NOTE  Lauren [name]  FUX:[id number] DOB: 11-23-1951 DOA: 08/21/2017 PCP: [name], MD  Brief Narrative:  Lauren [name] is a 66 y.o. female with medical history significant of hypertension, hyperlipidemia, DM 2, AVM, CAD, COPD, DDD, Endometrial cancer, GERD who was recently found to have right hydronephrosis and was going to follow up with Dr. Karsten Ro on 8/27.  She has had increase lower extremity edema and fatigue and generalized weakness that progressed until she was unable to ambulate.  In the ER, she was found to have acute on chronic urinary retention and foley catheter was placed with removal of 1.3 L of urine.  She had some related AKI.  I suspect that she has had urinary retention for a while contributing to her weakness and swelling.  Her creatinine has not improved much since foley placement but will give it another 24 hours.  She now has bilateral hydronephrosis.  We will repeat a renal US tomorrow and if stable to improved and if creatinine trending down, she can be discharged with a foley catheter with outpatient follow up with Urology in 2 weeks.  PT evaluation pending.  May need SNF at discharge.    Assessment & Plan:   Active Problems:   Essential hypertension, benign   Malignant neoplasm of corpus uteri, except isthmus (HCC)   DM2 (diabetes mellitus, type 2) (HCC)   COPD GOLD III/IV   Tobacco abuse   Coronary artery calcification of native artery   Narcotic dependence ([place])   Diabetes mellitus due to underlying condition with diabetic peripheral angiopathy without gangrene ([place])   Acute encephalopathy   Acute urinary retention   AKI (acute kidney injury) ([place])  Acute encephalopathy due to urinary retention, improved  Acute urinary retention -  Continue urinary catheter at discharge -  Repeat RUS tomorrow -  D/c myrbetriq -  Patient on chronic high dose narcotics and on anticholinergics which could be exacerbating her retention.  Currently holding  amitriptyline -  F/u urine culture  Cellulitis of the left shin vs. Stasis dermatitis -  Continue ceftriaxone  AKI likely due to urinary retention, currently 1.65, baseline 0.85 -  Minimize nephrotoxins (ARB and diuretics) and renally dose medications -  Repeat BMP in AM  Lower extremity edema likely related to a combination of urinary retention and grade 2 DD/chronic diastolic heart failure -  D/c IVF -  Lower extremity duplex pending > she is at risk for DVT given sedentary lifestyle  Essential hypertension - continue metoprolol - holding ARB  Diabetes mellitus type 2, CBG generally low, probably due to accumulation of insulin in setting of AKI -  Continue reduced lantus 30 units -  Continue low dose SSI  COPD gold III/IV, stable on room air  Chronic pain due to sciatica and disc problems.  Limited mobility at baseline -  Continuued oxycontin and percocet -  PT/OT, anticipate they will recommend SNF -  Agree with outpatient Neurology follow up  Grade 2 DD on ECHO, chronic diastolic heart failure -  F/u with cardiology  DVT prophylaxis:  lovenox Code Status:  full Family Communication:  Patient and her daughter Disposition Plan:  Home tomorrow after repeat RUS if creatinine and hydronephrosis are improved.     Consultants:   Spoke with Dr. Tresa Moore, urology, by phone  Procedures:  none  Antimicrobials:  Anti-infectives    Start     Dose/Rate Route Frequency Ordered Stop   08/22/17 2200  cefTRIAXone (ROCEPHIN) 1 g in dextrose 5 %  50 mL IVPB     1 g 100 mL/hr over 30 Minutes Intravenous Every 24 hours 08/22/17 0309     08/21/17 2345  cefTRIAXone (ROCEPHIN) 1 g in dextrose 5 % 50 mL IVPB     1 g 100 mL/hr over 30 Minutes Intravenous  Once 08/21/17 2340 08/22/17 0351       Subjective: Feeling like her thinking is much clearer today.  Continues have to some pains in the left lower quadrant and left upper quadrant.  Denies nausea, vomiting.  Swelling in legs  persists but seems a little better today.    Objective: Vitals:   08/22/17 0301 08/22/17 0343 08/22/17 0946 08/22/17 1314  BP: (!) 161/70 (!) 155/56 131/90 (!) 175/73  Pulse: 78 81 76 74  Resp: 20 19 19 19   Temp: 98.8 F (37.1 C) 99.1 F (37.3 C) 98.4 F (36.9 C) 98.8 F (37.1 C)  TempSrc: Oral Oral Oral Oral  SpO2: 95% 99% 98% 100%  Weight:  89.7 kg (197 lb 12 oz)    Height:  5\' 6"  (1.676 m)      Intake/Output Summary (Last 24 hours) at 08/22/17 1642 Last data filed at 08/22/17 1427  Gross per 24 hour  Intake                0 ml  Output             2875 ml  Net            -2875 ml   Filed Weights   08/21/17 1724 08/22/17 0343  Weight: 88.5 kg (195 lb) 89.7 kg (197 lb 12 oz)    Examination:  General exam:  Adult female.  No acute distress.  HEENT:  NCAT, MMM Respiratory system: Clear to auscultation bilaterally Cardiovascular system: Regular rate and rhythm, normal S1/S2. No murmurs, rubs, gallops or clicks.  Warm extremities Gastrointestinal system: Normal active bowel sounds, soft, large abdominal wall hernia on the right lower quadrant.  Moderately distended and TTP in the left lower quadrant without rebound or guarding.   MSK:  Normal tone and bulk, 2+ pitting bilateral lower extremity edema and left shin appears more pinkish-red than the right.    Neuro:  3/5 bilateral lower extremity strength.      Data Reviewed: I have personally reviewed following labs and imaging studies  CBC:  Recent Labs Lab 08/18/17 1200 08/21/17 1846 08/22/17 0358  WBC 9.7 16.2* 12.6*  HGB 9.9* 9.3* 9.6*  HCT 31.2* 28.6* 30.3*  MCV 94.3 93.2 93.5  PLT 356 339 607*   Basic Metabolic Panel:  Recent Labs Lab 08/18/17 1200 08/21/17 1846 08/22/17 0358  NA 140 135 138  K 4.7 3.8 3.3*  CL 100* 97* 97*  CO2 31 26 30   GLUCOSE 92 97 108*  BUN 28* 31* 30*  CREATININE 1.44* 1.66* 1.65*  CALCIUM 9.4 9.2 9.3  MG  --   --  1.5*  PHOS  --   --  4.6   GFR: Estimated Creatinine  Clearance: 37.9 mL/min (A) (by C-G formula based on SCr of 1.65 mg/dL (H)). Liver Function Tests:  Recent Labs Lab 08/21/17 1846 08/22/17 0358  AST 27 24  ALT 24 21  ALKPHOS 50 45  BILITOT 0.6 0.6  PROT 7.9 7.5  ALBUMIN 3.5 2.9*   No results for input(s): LIPASE, AMYLASE in the last 168 hours.  Recent Labs Lab 08/22/17 0358  AMMONIA 9   Coagulation Profile:  Recent Labs Lab 08/22/17 0358  INR 1.14   Cardiac Enzymes: No results for input(s): CKTOTAL, CKMB, CKMBINDEX, TROPONINI in the last 168 hours. BNP (last 3 results) No results for input(s): PROBNP in the last 8760 hours. HbA1C:  Recent Labs  08/22/17 0358  HGBA1C 5.9*   CBG:  Recent Labs Lab 08/21/17 1832 08/21/17 2125 08/22/17 0344 08/22/17 0826 08/22/17 1308  GLUCAP 77 88 103* 95 178*   Lipid Profile: No results for input(s): CHOL, HDL, LDLCALC, TRIG, CHOLHDL, LDLDIRECT in the last 72 hours. Thyroid Function Tests:  Recent Labs  08/22/17 0358  TSH 1.517   Anemia Panel:  Recent Labs  08/22/17 0358  VITAMINB12 857  FOLATE 22.5  FERRITIN 150  TIBC 307  IRON 19*  RETICCTPCT 1.6   Urine analysis:    Component Value Date/Time   COLORURINE YELLOW 08/21/2017 1915   APPEARANCEUR CLEAR 08/21/2017 1915   LABSPEC 1.005 08/21/2017 1915   LABSPEC 1.010 02/12/2009 1035   PHURINE 5.5 08/21/2017 1915   GLUCOSEU NEGATIVE 08/21/2017 1915   HGBUR NEGATIVE 08/21/2017 1915   BILIRUBINUR NEGATIVE 08/21/2017 1915   BILIRUBINUR Negative 02/12/2009 [place] 08/21/2017 1915   PROTEINUR NEGATIVE 08/21/2017 1915   UROBILINOGEN 0.2 08/14/2015 1400   NITRITE NEGATIVE 08/21/2017 1915   LEUKOCYTESUR SMALL (A) 08/21/2017 1915   LEUKOCYTESUR Large 02/12/2009 1035   Sepsis Labs: @LABRCNTIP (procalcitonin:4,lacticidven:4)  )No results found for this or any previous visit (from the past 240 hour(s)).    Radiology Studies: Dg Chest 2 View  Result Date: 08/21/2017 CLINICAL DATA:   Weakness, lethargy. EXAM: CHEST  2 VIEW COMPARISON:  Radiographs of July 22, 2017. FINDINGS: The heart size and mediastinal contours are within normal limits. Both lungs are clear. No pneumothorax or pleural effusion is noted. The visualized skeletal structures are unremarkable. IMPRESSION: No active cardiopulmonary disease. Electronically Signed   By: Marijo Conception, M.D.   On: 08/21/2017 18:16   Ct Head Wo Contrast  Result Date: 08/21/2017 CLINICAL DATA:  Altered level of consciousness EXAM: CT HEAD WITHOUT CONTRAST TECHNIQUE: Contiguous axial images were obtained from the base of the skull through the vertex without intravenous contrast. COMPARISON:  02/02/2017 FINDINGS: Brain: No evidence of acute infarction, hemorrhage, hydrocephalus, extra-axial collection or mass lesion/mass effect. Subcortical white matter and periventricular small vessel ischemic changes. Vascular: Intracranial atherosclerosis. Skull: Normal. Negative for fracture or focal lesion. Sinuses/Orbits: The visualized paranasal sinuses are essentially clear. The mastoid air cells are unopacified. Other: None. IMPRESSION: No evidence of acute intracranial abnormality. Small vessel ischemic changes. Electronically Signed   By: Julian Hy M.D.   On: 08/21/2017 18:19   Ct Renal Stone Study  Result Date: 08/21/2017 CLINICAL DATA:  Flank pain.  Weakness and lethargy. EXAM: CT ABDOMEN AND PELVIS WITHOUT CONTRAST TECHNIQUE: Multidetector CT imaging of the abdomen and pelvis was performed following the standard protocol without IV contrast. COMPARISON:  CT 08/04/2017 FINDINGS: Lower chest: Mild breathing motion artifact. No consolidation. Minimal right lung base atelectasis. Hepatobiliary: The liver is prominent size spanning 18.7 cm cranial caudal. No focal hepatic lesion allowing for lack contrast. Mild capsular nodularity. Postcholecystectomy with unchanged biliary prominence from prior exam. No calcified choledocholithiasis. Pancreas:  Parenchymal atrophy. No ductal dilatation or inflammation. Spleen: Normal in size without focal abnormality. Adrenals/Urinary Tract: No adrenal nodule. Moderate to severe right hydronephrosis and hydroureter with ureteric tortuosity, similar in appearance to prior exam. Mild thinning of the renal parenchyma. No urolithiasis or cause of obstruction. Moderate left hydronephrosis, ureteral distension and tortuosity, new from prior exam.  No stones or cause of obstruction. The urinary bladder is distended.  No bladder wall thickening. Stomach/Bowel: Large midline lower ventral abdominal wall hernia with diastasis recti. Hernia contains transverse colon and small bowel. Distal portion of stomach also extends into the upper portion of the abdominal wall defect. There is no bowel obstruction, inflammation or wall thickening. Colonic tortuosity with moderate colonic stool burden, similar to prior exam. Normal appendix. Vascular/Lymphatic: Dense aortic atherosclerosis. No aneurysm. No adenopathy. Reproductive: Status post hysterectomy. No adnexal masses. Other: No free air, free fluid, or intra-abdominal fluid collection. Musculoskeletal: Degenerative and postsurgical change in the lumbar spine. Chronic anterolisthesis of L4 on L5 and chronic bony remottling L5, unchanged from prior exam and lumbar spine myelogram 04/15/2016. IMPRESSION: 1. New moderate left hydroureteronephrosis without identifiable cause. Ureter is dilated throughout its course. Mild increase in right hydroureteronephrosis from prior exam. Urinary bladder is distended. No urolithiasis. The cause of urinary obstruction is unclear. Consider cystoscopy. 2. Large midline lower ventral abdominal wall hernia and diastasis recti containing colon, small bowel and portions of the stomach, no obstruction or inflammation. 3. Nodular hepatic contours raise concern for cirrhosis. Recommend correlation with cirrhosis risk factors. 4.  Aortic Atherosclerosis  (ICD10-I70.0). Electronically Signed   By: Jeb Levering M.D.   On: 08/21/2017 22:29     Scheduled Meds: . aspirin EC  81 mg Oral Daily  . escitalopram  20 mg Oral Daily  . ferrous sulfate  325 mg Oral TID WC  . guaiFENesin  600 mg Oral BID  . insulin aspart  0-9 Units Subcutaneous Q4H  . [START ON 08/23/2017] insulin glargine  30 Units Subcutaneous QHS  . metoprolol succinate  50 mg Oral Daily  . oxyCODONE  15 mg Oral Q12H  . pantoprazole  40 mg Oral Daily  . polyethylene glycol  17 g Oral BID  . rosuvastatin  5 mg Oral QHS  . senna  1 tablet Oral BID   Continuous Infusions: . cefTRIAXone (ROCEPHIN) IVPB 1 gram/50 mL D5W       LOS: 0 days    Time spent: 30 min    Janece Canterbury, MD [facility] Pager [phone]  If 7PM-7AM, please contact night-coverage [email or web address] Password [facility] 08/22/2017, 4:42 PM

## 2017-08-22 NOTE — H&P (Signed)
[name]:[id number] DOB: 1951-03-28 DOA: 08/21/2017     PCP: Lauren Late, MD   Outpatient Specialists: Urology Lauren [name], Cardiology Lauren [name], Neurology Lauren [name], Pulmonology Lauren [name], Pain clinic  Patient coming from: home Lives  With family    Chief Complaint: Confusion  HPI: Lauren [name] is a 66 y.o. female with medical history significant of hypertension, hyperlipidemia, DM 2, AVM, CAD, COPD, DDD, Endometrial cancer, GERD    Presented with worsening generalized weakness and confusion with lethargy she is usually walker bound but hasn't lately have had trouble ambulating. Recently diagnosed with R hydronephrosis followed by Urology the plan was for her to get cystoscopy done on Monday no localized neurological complaints denies any fevers or chills no chest pain or shortness of breath endorses lower extremity pain but she has history of degenerative disc disease and neuropathy. No nausea vomiting  Her feet has been feeling somewhat stiffer today. She's been having some trouble dorsiflexion bilaterally in the past but perhaps have been getting a little worse.  Regarding pertinent Chronic problems:   Neuropathy treated with IVIG  Fatty liver followed by Lauren [name] severe COPD (FEV1 0.79L, 30% predicted) and a pulmonary nodule.  Echo in 2017 showed normal LVEF, mild LVH, elevated mean LA pressure and mild PAH 36 mm Hg).  She has severe lumbar radiculopathy, especially bad left sciatica pain. She has severe sensorimotor neuropathy and is now getting a course of IVIG to see if that helps HAs hx of chronic anemia - She has mild microcytic anemia, Hgb 9.9 last fall, now 10.4 and no longer microcytic on Fe supplement. In 2015 she had EGD and colonoscopy and was found to have angiodysplasia. Repeat EGD in 2017 showed only mild antral erythema.   IN ER:  Temp (24hrs), Avg:98.4 F (36.9 C), Min:98.3 F (36.8 C), Max:98.4 F (36.9 C)      on arrival  ED Triage Vitals    Enc Vitals Group     BP 08/21/17 1720 (!) 168/77     Pulse Rate 08/21/17 1720 79     Resp 08/21/17 1720 18     Temp 08/21/17 1720 98.3 F (36.8 C)     Temp Source 08/21/17 1720 Oral     SpO2 08/21/17 1720 99 %     Weight 08/21/17 1724 195 lb (88.5 kg)     Height 08/21/17 1724 5\' 6"  (1.676 m)     Head Circumference --      Peak Flow --      Pain Score 08/21/17 2130 7     Pain Loc --      Pain Edu? --      Excl. in [place]? --   RR 18 97% HR 86 BP 151/63 Glu 88 BNP 125 Lactic acid 0.94 Trop 0.00 Na 135 K 3.8 Cr 1.66 WBC 16.2 Hg 9.3 VBG 7.411/45.9 CT abd: Bilateral hydroureteronephrosis Following Medications were ordered in ER: Medications  cefTRIAXone (ROCEPHIN) 1 g in dextrose 5 % 50 mL IVPB (not administered)  ipratropium-albuterol (DUONEB) 0.5-2.5 (3) MG/3ML nebulizer solution 3 mL (3 mLs Nebulization Given 08/21/17 1843)  oxyCODONE-acetaminophen (PERCOCET/ROXICET) 5-325 MG per tablet 2 tablet (2 tablets Oral Given 08/21/17 2129)     ER provider discussed case with:  Urology who recommended IV antibiotics and foley placement.   Hospitalist was called for admission for Acute encephalopathy and urinary retention  Review of Systems:    Pertinent positives include:  fatigue,   Constitutional:  No weight loss, night sweats, Fevers,  chills,weight loss  HEENT:  No headaches, Difficulty swallowing,Tooth/dental problems,Sore throat,  No sneezing, itching, ear ache, nasal congestion, post nasal drip,  Cardio-vascular:  No chest pain, Orthopnea, PND, anasarca, dizziness, palpitations.no Bilateral lower extremity swelling  GI:  No heartburn, indigestion, abdominal pain, nausea, vomiting, diarrhea, change in bowel habits, loss of appetite, melena, blood in stool, hematemesis Resp:  no shortness of breath at rest. No dyspnea on exertion, No excess mucus, no productive cough, No non-productive cough, No coughing up of blood.No change in color of mucus.No wheezing. Skin:  no rash or  lesions. No jaundice GU:  no dysuria, change in color of urine, no urgency or frequency. No straining to urinate.  No flank pain.  Musculoskeletal:  No joint pain or no joint swelling. No decreased range of motion. No back pain.  Psych:  No change in mood or affect. No depression or anxiety. No memory loss.  Neuro: no localizing neurological complaints, no tingling, no weakness, no double vision, no gait abnormality, no slurred speech, no confusion  As per HPI otherwise 10 point review of systems negative.   Past Medical History: Past Medical History:  Diagnosis Date  . Adenomatous colon polyp   . Anemia   . Anxiety   . AVM (arteriovenous malformation)    cecum  . CAD (coronary artery disease)   . Carotid artery occlusion   . Cataract    bil cateracts removed  . COPD (chronic obstructive pulmonary disease) ([place])   . Cough   . DDD (degenerative disc disease), lumbar   . Depression   . Diabetes mellitus   . Elevated LFTs   . Endometrial ca [facility])    endometrial ca dx 4/11  . GERD (gastroesophageal reflux disease)   . Gout 2015  . Hernia of flank   . Hydronephrosis   . Hyperlipidemia   . Hypertension   . Insomnia   . OAB (overactive bladder)   . Osteoarthritis   . Peripheral neuropathy    following chemotherapy  . Psoriasis   . Pulmonary nodule    since 2016  . Radiation 05/01/2011-06/11/11   5600 cGy 28 fxs/periaortic  . Stroke ([place])    tia  . Thyroid nodule   . TIA (transient ischemic attack) 2015  . 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 Uterine cancer ([place])   . Ventral hernia    postoperative since 2009   Past Surgical History:  Procedure Laterality Date  . ABDOMINAL HYSTERECTOMY  2009   for ca  . BACK SURGERY  Aug/sept 2014   3 back surgeries  (Aug 2014 & Sept 2014)  . CHOLECYSTECTOMY     biliteral  . COLONOSCOPY  11/13/2014   negative except for a small cecal AVM and 1 polyp  . ENDARTERECTOMY Right 08/17/2015   Procedure:  Right Carotid Endartarectomy with patch Angioplasty ;  Surgeon: Lauren Misty, MD;  Location: [facility];  Service: Vascular;  Laterality: Right;  . EYE SURGERY Right Aug. 2013   Cataract, lens implant  . EYE SURGERY Left Sept. 2013   Cataract, lens implant  . I & D of lumbar fusion /removal of hardware for chronic infection  Jan. 8, 2015  . right carotid endarterectomy  2016  . SPINE SURGERY       Social History:  Ambulatory  walker       reports that she quit smoking about 6 months ago. Her smoking use included Cigarettes. She has a 4.00 pack-year  smoking history. She has never used smokeless tobacco. She reports that she does not drink alcohol or use drugs.  Allergies:   Allergies  Allergen Reactions  . Ambien [Zolpidem Tartrate] Other (See Comments)    Per the pt, "it makes me go crazy"  . Sulfa Antibiotics Rash  . Elavil [Amitriptyline Hcl] Other (See Comments)    Gastritis  . Prozac [Fluoxetine Hcl] Anxiety and Other (See Comments)    irritability     Family History:   Family History  Problem Relation Age of Onset  . Stroke Mother   . Irritable bowel syndrome Mother   . Heart attack Father 58       died  . Heart disease Father        Heart Disease before age 63, rheumatic heart disease  . Hyperlipidemia Father   . Hypertension Father   . Diabetes Daughter   . Hypertension Daughter   . Irritable bowel syndrome Daughter   . Colon cancer Neg Hx   . Esophageal cancer Neg Hx   . Pancreatic cancer Neg Hx   . Rectal cancer Neg Hx   . Stomach cancer Neg Hx   . Neuropathy Neg Hx     Medications: Prior to Admission medications   Medication Sig Start Date End Date Taking? Authorizing Provider  Albuterol Sulfate (PROAIR RESPICLICK) 295 (90 Base) MCG/ACT AEPB Inhale 2 puffs into the lungs 4 (four) times daily as needed (congestion, cough, wheezing).   Yes [provider]  aspirin EC 81 MG tablet Take 81 mg by mouth daily.   Yes [provider]    betamethasone dipropionate (DIPROLENE) 0.05 % ointment Apply 1 application topically 2 (two) times daily as needed (psoriasis).    Yes [provider]  Cholecalciferol (VITAMIN D3) 1000 UNITS CAPS Take 1,000 Units by mouth daily.    Yes [provider]  doxycycline (VIBRA-TABS) 100 MG tablet Take 100 mg by mouth 2 (two) times daily. 08/18/17  Yes [provider]  escitalopram (LEXAPRO) 20 MG tablet Take 20 mg by mouth daily.   Yes [provider]  esomeprazole (NEXIUM) 40 MG capsule Take 40 mg by mouth daily.    Yes [provider]  ferrous sulfate 325 (65 FE) MG tablet Take 325 mg by mouth daily.    Yes [provider]  Fluticasone-Salmeterol (ADVAIR) 250-50 MCG/DOSE AEPB Inhale 1 puff into the lungs 2 (two) times daily as needed (shortness of breath).    Yes [provider]  folic acid (FOLVITE) 1 MG tablet Take 1 mg by mouth daily.   Yes [provider]  furosemide (LASIX) 40 MG tablet Take 1 tablet (40 mg total) by mouth 2 (two) times daily. 08/14/17  Yes Lauren [name], Mihai, MD  gabapentin (NEURONTIN) 600 MG tablet Take 600 mg by mouth 6 (six) times daily. 6am, 10am, 4pm, 6pm, 10pm, midnight   Yes [provider]  insulin glargine (LANTUS) 100 UNIT/ML injection Inject 45 Units into the skin at bedtime.    Yes [provider]  losartan (COZAAR) 100 MG tablet Take 100 mg by mouth daily.     Yes [provider]  magnesium oxide (MAG-OX) 400 MG tablet Take 400 mg by mouth daily.   Yes [provider]  Melatonin 10 MG TABS Take 10 mg by mouth at bedtime.   Yes [provider]  metaxalone (SKELAXIN) 800 MG tablet Take 800 mg by mouth 3 (three) times daily. 6am, noon, 6pm   Yes [provider]  metoprolol succinate (TOPROL-XL) 50 MG 24 hr tablet Take 50 mg by mouth daily. 11/21/16  Yes [provider]  mirabegron ER (MYRBETRIQ) 50 MG TB24 tablet Take 50 mg by mouth daily.    Yes [provider]  Multiple Vitamin (MULTIVITAMIN WITH MINERALS) TABS tablet Take 1 tablet by mouth daily.   Yes [provider]  mupirocin ointment (BACTROBAN) 2 % Apply 1 application topically daily. 08/18/17  Yes [provider]  nitrofurantoin (MACRODANTIN) 100 MG capsule Take 100 mg by mouth at bedtime.    Yes [provider]  nortriptyline (PAMELOR) 10 MG capsule Take 10 mg by mouth at bedtime.    Yes [provider]  nystatin (MYCOSTATIN) 100000 UNIT/ML suspension Take 5 mLs by mouth 4 (four) times daily. Swish and swallow 08/10/17  Yes [provider]  ondansetron (ZOFRAN-ODT) 4 MG disintegrating tablet Take 4 mg by mouth every 8 (eight) hours as needed for nausea or vomiting.  09/16/16  Yes [provider]  OXYCONTIN 15 MG 12 hr tablet Take 15 mg by mouth every 12 (twelve) hours. 08/07/17  Yes [provider]  potassium chloride SA (KLOR-CON M20) 20 MEQ tablet Take 1 tablet (20 mEq total) by mouth 2 (two) times daily. 08/14/17 11/12/17 Yes Lauren [name], Mihai, MD  rosuvastatin (CRESTOR) 5 MG tablet Take 5 mg by mouth at bedtime. 01/13/17  Yes [provider]  tapentadol (NUCYNTA) 50 MG tablet Take 50 mg by mouth every 6 (six) hours. 6 am, noon, 6pm  And midnight   Yes [provider]  VESICARE 10 MG tablet Take 10 mg by mouth daily. 08/07/17  Yes [provider]  vitamin B-12 (CYANOCOBALAMIN) 1000 MCG tablet Take 1,000 mcg by mouth daily.   Yes [provider]  glucose blood (ACCU-CHEK AVIVA PLUS) test strip 1 each by Other route as needed. Use as instructed     [provider]  nitroGLYCERIN (NITROSTAT) 0.4 MG SL tablet Place 1 tablet (0.4 mg total) under the tongue every 5 (five) minutes as needed for chest pain. 06/15/13   Lauren [name], Dani Gobble, MD    Physical Exam: Patient Vitals for the past 24 hrs:  BP Temp Temp src Pulse Resp SpO2 Height Weight  08/21/17 2130 (!) 151/63 98.4 F  (36.9 C) Oral 86 18 97 % - -  08/21/17 1730 (!) 175/111 - - 80 - 92 % - -  08/21/17 1724 - - - - - - 5\' 6"  (1.676 m) 88.5 kg (195 lb)  08/21/17 1722 - - - - - 96 % - -  08/21/17 1720 (!) 168/77 98.3 F (36.8 C) Oral 79 18 99 % - -    1. General:  in No Acute distress 2. Psychological: Alert and   Oriented 3. Head/ENT:    Dry Mucous Membranes                          Head Non traumatic, neck supple                         Poor Dentition 4. SKIN: decreased Skin turgor,  Skin clean Dry and intact no rash 5. Heart: Regular rate and rhythm no Murmur, Rub or gallop 6. Lungs: Somewhat distant no wheezes or crackles   7. Abdomen: Soft,  non-tender  distended with large palpable. Ventral hernia 8. Lower extremities: no clubbing, cyanosis,1+edema right worse than left. Patient has bilateral stiffness of  lower extremity at the ankles 9. Neurologically Grossly intact, moving all 4 extremities equally   5 out of 5 in all 4 extremities cranial nerves II through XII intact 10. MSK: Normal range of motion   body mass index is 31.47 kg/m.  Labs on Admission:   Labs on Admission: I have personally reviewed following labs and imaging studies  CBC:  Recent Labs Lab 08/18/17 1200 08/21/17 1846  WBC 9.7 16.2*  HGB 9.9* 9.3*  HCT 31.2* 28.6*  MCV 94.3 93.2  PLT 356 035   Basic Metabolic Panel:  Recent Labs Lab 08/18/17 1200 08/21/17 1846  NA 140 135  K 4.7 3.8  CL 100* 97*  CO2 31 26  GLUCOSE 92 97  BUN 28* 31*  CREATININE 1.44* 1.66*  CALCIUM 9.4 9.2   GFR: Estimated Creatinine Clearance: 37.4 mL/min (A) (by C-G formula based on SCr of 1.66 mg/dL (H)). Liver Function Tests:  Recent Labs Lab 08/21/17 1846  AST 27  ALT 24  ALKPHOS 50  BILITOT 0.6  PROT 7.9  ALBUMIN 3.5   No results for input(s): LIPASE, AMYLASE in the last 168 hours. No results for input(s): AMMONIA in the last 168 hours. Coagulation Profile: No results for input(s): INR, PROTIME in the last 168  hours. Cardiac Enzymes: No results for input(s): CKTOTAL, CKMB, CKMBINDEX, TROPONINI in the last 168 hours. BNP (last 3 results) No results for input(s): PROBNP in the last 8760 hours. HbA1C: No results for input(s): HGBA1C in the last 72 hours. CBG:  Recent Labs Lab 08/18/17 1129 08/21/17 1832 08/21/17 2125  GLUCAP 82 77 88   Lipid Profile: No results for input(s): CHOL, HDL, LDLCALC, TRIG, CHOLHDL, LDLDIRECT in the last 72 hours. Thyroid Function Tests: No results for input(s): TSH, T4TOTAL, FREET4, T3FREE, THYROIDAB in the last 72 hours. Anemia Panel: No results for input(s): VITAMINB12, FOLATE, FERRITIN, TIBC, IRON, RETICCTPCT in the last 72 hours. Urine analysis:    Component Value Date/Time   COLORURINE YELLOW 08/21/2017 1915   APPEARANCEUR CLEAR 08/21/2017 1915   LABSPEC 1.005 08/21/2017 1915   LABSPEC 1.010 02/12/2009 1035   PHURINE 5.5 08/21/2017 1915   GLUCOSEU NEGATIVE 08/21/2017 1915   HGBUR NEGATIVE 08/21/2017 1915   BILIRUBINUR NEGATIVE 08/21/2017 1915   BILIRUBINUR Negative 02/12/2009 [place] 08/21/2017 1915   PROTEINUR NEGATIVE 08/21/2017 1915   UROBILINOGEN 0.2 08/14/2015 1400   NITRITE NEGATIVE 08/21/2017 1915   LEUKOCYTESUR SMALL (A) 08/21/2017 1915   LEUKOCYTESUR Large 02/12/2009 1035   Sepsis Labs: @LABRCNTIP (procalcitonin:4,lacticidven:4) )No results found for this or any previous visit (from the past 240 hour(s)).    UA  no evidence of UTI     Lab Results  Component Value Date   HGBA1C 6.9 (H) 08/17/2015    Estimated Creatinine Clearance: 37.4 mL/min (A) (by C-G formula based on SCr of 1.66 mg/dL (H)).  BNP (last 3 results) No results for input(s): PROBNP in the last 8760 hours.   ECG REPORT  Independently reviewed Rate:81  Rhythm: NSR ST&T Change: No acute ischemic changes   QTC 462  Filed Weights   08/21/17 1724  Weight: 88.5 kg (195 lb)     Cultures:    Component Value Date/Time   SDES BLOOD RIGHT  FOREARM 02/02/2017 1540   SPECREQUEST BOTTLES DRAWN AEROBIC AND ANAEROBIC 5CC 02/02/2017 1540   CULT (A) 02/02/2017 1540    STAPHYLOCOCCUS SPECIES (COAGULASE NEGATIVE) THE SIGNIFICANCE OF ISOLATING THIS ORGANISM FROM A SINGLE SET OF BLOOD CULTURES WHEN MULTIPLE SETS  ARE DRAWN IS UNCERTAIN. PLEASE NOTIFY THE MICROBIOLOGY DEPARTMENT WITHIN ONE WEEK IF SPECIATION AND SENSITIVITIES ARE REQUIRED.    REPTSTATUS 02/05/2017 FINAL 02/02/2017 1540     Radiological Exams on Admission: Dg Chest 2 View  Result Date: 08/21/2017 CLINICAL DATA:  Weakness, lethargy. EXAM: CHEST  2 VIEW COMPARISON:  Radiographs of July 22, 2017. FINDINGS: The heart size and mediastinal contours are within normal limits. Both lungs are clear. No pneumothorax or pleural effusion is noted. The visualized skeletal structures are unremarkable. IMPRESSION: No active cardiopulmonary disease. Electronically Signed   By: Marijo Conception, M.D.   On: 08/21/2017 18:16   Ct Head Wo Contrast  Result Date: 08/21/2017 CLINICAL DATA:  Altered level of consciousness EXAM: CT HEAD WITHOUT CONTRAST TECHNIQUE: Contiguous axial images were obtained from the base of the skull through the vertex without intravenous contrast. COMPARISON:  02/02/2017 FINDINGS: Brain: No evidence of acute infarction, hemorrhage, hydrocephalus, extra-axial collection or mass lesion/mass effect. Subcortical white matter and periventricular small vessel ischemic changes. Vascular: Intracranial atherosclerosis. Skull: Normal. Negative for fracture or focal lesion. Sinuses/Orbits: The visualized paranasal sinuses are essentially clear. The mastoid air cells are unopacified. Other: None. IMPRESSION: No evidence of acute intracranial abnormality. Small vessel ischemic changes. Electronically Signed   By: Julian Hy M.D.   On: 08/21/2017 18:19   Ct Renal Stone Study  Result Date: 08/21/2017 CLINICAL DATA:  Flank pain.  Weakness and lethargy. EXAM: CT ABDOMEN AND PELVIS  WITHOUT CONTRAST TECHNIQUE: Multidetector CT imaging of the abdomen and pelvis was performed following the standard protocol without IV contrast. COMPARISON:  CT 08/04/2017 FINDINGS: Lower chest: Mild breathing motion artifact. No consolidation. Minimal right lung base atelectasis. Hepatobiliary: The liver is prominent size spanning 18.7 cm cranial caudal. No focal hepatic lesion allowing for lack contrast. Mild capsular nodularity. Postcholecystectomy with unchanged biliary prominence from prior exam. No calcified choledocholithiasis. Pancreas: Parenchymal atrophy. No ductal dilatation or inflammation. Spleen: Normal in size without focal abnormality. Adrenals/Urinary Tract: No adrenal nodule. Moderate to severe right hydronephrosis and hydroureter with ureteric tortuosity, similar in appearance to prior exam. Mild thinning of the renal parenchyma. No urolithiasis or cause of obstruction. Moderate left hydronephrosis, ureteral distension and tortuosity, new from prior exam. No stones or cause of obstruction. The urinary bladder is distended.  No bladder wall thickening. Stomach/Bowel: Large midline lower ventral abdominal wall hernia with diastasis recti. Hernia contains transverse colon and small bowel. Distal portion of stomach also extends into the upper portion of the abdominal wall defect. There is no bowel obstruction, inflammation or wall thickening. Colonic tortuosity with moderate colonic stool burden, similar to prior exam. Normal appendix. Vascular/Lymphatic: Dense aortic atherosclerosis. No aneurysm. No adenopathy. Reproductive: Status post hysterectomy. No adnexal masses. Other: No free air, free fluid, or intra-abdominal fluid collection. Musculoskeletal: Degenerative and postsurgical change in the lumbar spine. Chronic anterolisthesis of L4 on L5 and chronic bony remottling L5, unchanged from prior exam and lumbar spine myelogram 04/15/2016. IMPRESSION: 1. New moderate left hydroureteronephrosis  without identifiable cause. Ureter is dilated throughout its course. Mild increase in right hydroureteronephrosis from prior exam. Urinary bladder is distended. No urolithiasis. The cause of urinary obstruction is unclear. Consider cystoscopy. 2. Large midline lower ventral abdominal wall hernia and diastasis recti containing colon, small bowel and portions of the stomach, no obstruction or inflammation. 3. Nodular hepatic contours raise concern for cirrhosis. Recommend correlation with cirrhosis risk factors. 4.  Aortic Atherosclerosis (ICD10-I70.0). Electronically Signed   By: Jeb Levering M.D.   On:  08/21/2017 22:29    Chart has been reviewed    Assessment/Plan   66 y.o. female with medical history significant of hypertension, hyperlipidemia, DM 2, AVM, CAD, COPD, DDD, Endometrial cancer, GERD  Admitted for Acute encephalopathy and urinary retention   Present on Admission: . Acute encephalopathy -   - most likely multifactorial secondary to combination of  mild dehydration secondary to decreased by mouth intake,  polypharmacy   - Will rehydrate   - treat underlining infection patient on chronic antibiotics for recurrent UTIs urology recommended IV Rocephin for now    - Hold contributing medications for tonight and restart as able Check ammonia level   . COPD GOLD III/IV currently stable continue home medications no evidence of hypercarbia . Coronary artery calcification of native artery currently stable and no chest pain . Diabetes mellitus- decreased dose of Lantus given soft blood pressures and worsening renal function order sliding scale monitor for hypoglycemia . Essential hypertension, benign - hold ACE inhibitor given a GI . Malignant neoplasm of corpus uteri, except isthmus (HCC) - status post resection CT of abdomen showing no evidence of recurrence . Narcotic dependence ([place]) - suspect worsening renal clearance with worsening sedation. Hold for tonight and restart as able may  need to have dosing adjusted if kidney function does not improve . Tobacco abuse currently in remission . Acute urinary retention - Place Foley catheter discontinue offending medications. Suspect polypharmacy may be contributing to urinary retention. Appreciate urology input . AKI (acute kidney injury) ([place]) - Given Hydronephrosis Place, Foley gently rehydrate hold Lasix for tonight hold ACE inhibitor. Assess FENa Lower extremity edema we'll obtain Dopplers to rule out DVT given isometric edema. Stiffness of the ankles bilaterally unclear etiology may be contributing to difficulty with ambulation. We'll need to obtain more history from the family in the morning regarding severity of this and progression may need follow-up with neurology she has history of long-standing neuropathy Other plan as per orders.  DVT prophylaxis:  SCD   Code Status:  FULL CODE as per patient    Family Communication:   Family not  at  Bedside    Disposition Plan:       To home once workup is complete and patient is stable                        Would benefit from PT/OT eval prior to DC   ordered                            Consults called: ER spoke to Urology   Admission status:   obs   Level of care    tele          I have spent a total of 56 min on this admission   Jezel Basto 08/22/2017, 1:33 AM    [facility]  Pager [phone]   after 2 AM please page floor coverage PA If 7AM-7PM, please contact the day team taking care of the patient  [email or web address]  Password [email or web address]

## 2017-08-22 NOTE — Progress Notes (Signed)
*  PRELIMINARY RESULTS* Echocardiogram 2D Echocardiogram has been performed.  [name] 08/22/2017, 1:15 PM

## 2017-08-22 NOTE — Progress Notes (Signed)
RN may call Loree Fee [phone] for report at 1:40am

## 2017-08-22 NOTE — Progress Notes (Signed)
PT Cancellation Note  Patient Details Name: Lauren [name] MRN: [id number] DOB: [birth year]   Cancelled Treatment:    Reason Eval/Treat Not Completed: Patient not medically ready (bil LE dopplers pending to R/O DVT)   [facility] 08/22/2017, 9:27 AM

## 2017-08-22 NOTE — Progress Notes (Signed)
VASCULAR LAB PRELIMINARY  PRELIMINARY  PRELIMINARY  PRELIMINARY  Right lower extremity venous duplex completed.    Preliminary report:  There is no obvious evidence of DVT or SVT noted in the right lower extremity.   Shashank Kwasnik, RVT 08/22/2017, 5:46 PM

## 2017-08-23 ENCOUNTER — Inpatient Hospital Stay (HOSPITAL_COMMUNITY): Payer: Medicare Other

## 2017-08-23 DIAGNOSIS — N1339 Other hydronephrosis: Secondary | ICD-10-CM

## 2017-08-23 LAB — BASIC METABOLIC PANEL
Anion gap: 8 (ref 5–15)
BUN: 19 mg/dL (ref 6–20)
CALCIUM: 9 mg/dL (ref 8.9–10.3)
CO2: 27 mmol/L (ref 22–32)
CREATININE: 1.07 mg/dL — AB (ref 0.44–1.00)
Chloride: 100 mmol/L — ABNORMAL LOW (ref 101–111)
GFR calc Af Amer: >60 mL/min (ref >60)
GFR, EST NON AFRICAN AMERICAN: 52 mL/min — AB (ref >60)
GLUCOSE: 171 mg/dL — AB (ref 65–99)
POTASSIUM: 3.6 mmol/L (ref 3.5–5.1)
SODIUM: 135 mmol/L (ref 135–145)

## 2017-08-23 LAB — URINE CULTURE: CULTURE: NO GROWTH

## 2017-08-23 LAB — GLUCOSE, CAPILLARY
GLUCOSE-CAPILLARY: 206 mg/dL — AB (ref 65–99)
GLUCOSE-CAPILLARY: 186 mg/dL — AB (ref 65–99)
GLUCOSE-CAPILLARY: 135 mg/dL — AB (ref 65–99)
GLUCOSE-CAPILLARY: 150 mg/dL — AB (ref 65–99)
Glucose-Capillary: 132 mg/dL — ABNORMAL HIGH (ref 65–99)

## 2017-08-23 LAB — SODIUM, URINE, RANDOM: Sodium, Ur: 127 mmol/L

## 2017-08-23 LAB — CREATININE, URINE, RANDOM: Creatinine, Urine: 37.99 mg/dL

## 2017-08-23 LAB — CBC
HEMATOCRIT: 28.7 % — AB (ref 36.0–46.0)
Hemoglobin: 9.4 g/dL — ABNORMAL LOW (ref 12.0–15.0)
MCH: 30.2 pg (ref 26.0–34.0)
MCHC: 32.8 g/dL (ref 30.0–36.0)
MCV: 92.3 fL (ref 78.0–100.0)
PLATELETS: 411 10*3/uL — AB (ref 150–400)
RBC: 3.11 MIL/uL — ABNORMAL LOW (ref 3.87–5.11)
RDW: 16.3 % — AB (ref 11.5–15.5)
WBC: 12.6 10*3/uL — AB (ref 4.0–10.5)

## 2017-08-23 MED ORDER — NORTRIPTYLINE HCL 10 MG PO CAPS
10.0000 mg | ORAL_CAPSULE | Freq: Every day | ORAL | Status: DC
  Administered 2017-08-23 – 2017-08-24 (×2): 10 mg via ORAL
  Filled 2017-08-23 (×2): qty 1

## 2017-08-23 MED ORDER — GABAPENTIN 300 MG PO CAPS
600.0000 mg | ORAL_CAPSULE | ORAL | Status: DC
  Administered 2017-08-23 – 2017-08-25 (×13): 600 mg via ORAL
  Filled 2017-08-23 (×13): qty 2

## 2017-08-23 MED ORDER — METAXALONE 800 MG PO TABS
800.0000 mg | ORAL_TABLET | Freq: Three times a day (TID) | ORAL | Status: DC
  Administered 2017-08-23 – 2017-08-25 (×6): 800 mg via ORAL
  Filled 2017-08-23 (×7): qty 1

## 2017-08-23 MED ORDER — BISACODYL 10 MG RE SUPP
10.0000 mg | Freq: Once | RECTAL | Status: DC
  Filled 2017-08-23: qty 1

## 2017-08-23 MED ORDER — TAPENTADOL HCL 50 MG PO TABS
50.0000 mg | ORAL_TABLET | Freq: Four times a day (QID) | ORAL | Status: DC
  Administered 2017-08-23 – 2017-08-25 (×8): 50 mg via ORAL
  Filled 2017-08-23 (×8): qty 1

## 2017-08-23 NOTE — Progress Notes (Signed)
PT Note  Patient Details Name: Lauren [name] MRN: [id number] DOB: 1951-01-27   Limited evaluation Judd Gaudier performed today at 3:00pm due to pt unable to participate due to pain. Spent 30 minutes with husband present as well, educating and attempting to help to move patient to EOB. No matter how we tried , pt stated it was too painful and she could not attempt to continue to try more at this time. She stated pain was in her abdomen and in her back and down the back of her legs and some in the front of her legs. Husband and I were concerned for patient stated she may have to have a BM however refusing to use the bedpan.   I did discuss that we would continue o follow her and attempt to help her with mobility, however if she was not able to move, that her husband would not be able to gie her that much assistance at home.  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  We will continue to follow her while she is here, however at this time would have to recommend SNF, unless she is able to move more.   Full write up to follow.   Clide Dales 08/23/2017, 5:48 PM

## 2017-08-23 NOTE — Progress Notes (Signed)
Attempted to place pt on bedpan with 2 person assist. Pt crying out in pain. Asking [place] to not touch her. Administered PRN pain medication. Not effective. Paged MD. New orders placed. Will update MD in 1 hour

## 2017-08-23 NOTE — Progress Notes (Signed)
PROGRESS NOTE  DARELY BECKNELL  WCB:[id number] DOB: May 14, 1951 DOA: 08/21/2017 PCP: [name], MD  Brief Narrative:  Lauren [name] is a 66 y.o. female with medical history significant of hypertension, hyperlipidemia, DM 2, AVM, CAD, COPD, DDD, Endometrial cancer, GERD who was recently found to have right hydronephrosis and was going to follow up with Dr. Karsten Ro on 8/27.  She has had increase lower extremity edema and fatigue and generalized weakness that progressed until she was unable to ambulate.  In the ER, she was found to have acute on chronic urinary retention and foley catheter was placed with removal of 1.3 L of urine.  She had some related AKI.  I suspect that she has had urinary retention for a while contributing to her weakness and swelling.  Her creatinine has not improved much since foley placement but will give it another 24 hours.  She now has bilateral hydronephrosis.  We will repeat a renal US tomorrow and if stable to improved and if creatinine trending down, she can be discharged with a foley catheter with outpatient follow up with Urology in 2 weeks.  PT evaluation pending.  May need SNF at discharge.  Patient with severe pains today due to pain medications being withheld.    Assessment & Plan:   Active Problems:   Essential hypertension, benign   Malignant neoplasm of corpus uteri, except isthmus (HCC)   DM2 (diabetes mellitus, type 2) (HCC)   COPD GOLD III/IV   Tobacco abuse   Coronary artery calcification of native artery   Narcotic dependence ([place])   Diabetes mellitus due to underlying condition with diabetic peripheral angiopathy without gangrene ([place])   Acute encephalopathy   Acute urinary retention   AKI (acute kidney injury) ([place])  Acute encephalopathy due to urinary retention and now narcotic withdrawal -  Resuming home medications such as gabapentin and nucyinta  Acute urinary retention -  Continue urinary catheter at discharge -  Repeat RUS:   Hydronephrosis resolved -  D/c myrbetriq -  urine culture NGF  Cellulitis of the left shin vs. Stasis dermatitis -  Continue ceftriaxone day 2  AKI likely due to urinary retention, 1.65 mg/dl at admission, baseline 0.85, trending down -  Minimize nephrotoxins (ARB and diuretics) and renally dose medications -  Repeat BMP in AM  Lower extremity edema likely related to a combination of urinary retention and grade 2 DD/chronic diastolic heart failure -  Lower extremity duplex pending negative for DVT -  ECHO:  EF 55-60% with grade 2 DD and increased filling pressure.  trivial MVR -  Continue to hold lasix as she is autodiuresing currently and neg 3.3L yesterday  Essential hypertension, BP elevated today likely due to narcotic withdrawal - continue metoprolol - holding ARB - tx withdrawal  Diabetes mellitus type 2, CBG generally low, probably due to accumulation of insulin in setting of AKI -  Continue reduced lantus 30 units -  Continue low dose SSI  COPD gold III/IV, stable on room air  Chronic pain due to sciatica and disc problems.  Limited mobility at baseline -  Continuued oxycontin and percocet -  PT/OT, anticipate they will recommend SNF -  Agree with outpatient Neurology follow up -  Resume nucynta, gabapentin, and metaxalone  Grade 2 DD on ECHO, chronic diastolic heart failure -  F/u with cardiology  DVT prophylaxis:  lovenox Code Status:  full Family Communication:  Patient and her daughter Disposition Plan:  Patient in withdrawal today with severe pains  preventing her from even moving in bed, increased confusion.  RUS and kidney function look better, however.    Consultants:   Spoke with Dr. Tresa Moore, urology, by phone  Procedures:  none  Antimicrobials:  Anti-infectives    Start     Dose/Rate Route Frequency Ordered Stop   08/22/17 2200  cefTRIAXone (ROCEPHIN) 1 g in dextrose 5 % 50 mL IVPB     1 g 100 mL/hr over 30 Minutes Intravenous Every 24 hours  08/22/17 0309     08/21/17 2345  cefTRIAXone (ROCEPHIN) 1 g in dextrose 5 % 50 mL IVPB     1 g 100 mL/hr over 30 Minutes Intravenous  Once 08/21/17 2340 08/22/17 0351       Subjective: Confused and moaning in pain today.  C/o severe abdominal pains.    Objective: Vitals:   08/23/17 0225 08/23/17 0525 08/23/17 0602 08/23/17 1305  BP: (!) 177/76 (!) 181/79 (!) 169/78 (!) 164/67  Pulse: 73 78 82 74  Resp:  16  17  Temp:  98.8 F (37.1 C)  98.5 F (36.9 C)  TempSrc:  Oral  Oral  SpO2:  97%  100%  Weight:      Height:        Intake/Output Summary (Last 24 hours) at 08/23/17 1612 Last data filed at 08/23/17 1306  Gross per 24 hour  Intake              110 ml  Output             2500 ml  Net            -2390 ml   Filed Weights   08/21/17 1724 08/22/17 0343  Weight: 88.5 kg (195 lb) 89.7 kg (197 lb 12 oz)    Examination:  General exam:  Adult female.  Diaphoretic, eyes closed and scrunched shut HEENT:  NCAT, MMM Respiratory system: CTAB Cardiovascular system: RRR, no MRG Gastrointestinal system: NABS, large abdominal wall hernia on the right lower quadrant with reducible contents, left abdomen is mildly distended, tender to palpation diffusely causing the patient to grimace, but no guarding  MSK:  Normal tone and bulk, 2+ pitting bilateral lower extremity, the left shin appears to be less pinkish red than yesterday  Neuro:  4-5 bilateral upper extremity strength, 3 out of 5 bilateral lower extremities strength, screams out in pain with minimal movement of her legs    Data Reviewed: I have personally reviewed following labs and imaging studies  CBC:  Recent Labs Lab 08/18/17 1200 08/21/17 1846 08/22/17 0358 08/23/17 0827  WBC 9.7 16.2* 12.6* 12.6*  HGB 9.9* 9.3* 9.6* 9.4*  HCT 31.2* 28.6* 30.3* 28.7*  MCV 94.3 93.2 93.5 92.3  PLT 356 339 442* 914*   Basic Metabolic Panel:  Recent Labs Lab 08/18/17 1200 08/21/17 1846 08/22/17 0358 08/23/17 0827  NA 140  135 138 135  K 4.7 3.8 3.3* 3.6  CL 100* 97* 97* 100*  CO2 31 26 30 27   GLUCOSE 92 97 108* 171*  BUN 28* 31* 30* 19  CREATININE 1.44* 1.66* 1.65* 1.07*  CALCIUM 9.4 9.2 9.3 9.0  MG  --   --  1.5*  --   PHOS  --   --  4.6  --    GFR: Estimated Creatinine Clearance: 57.6 mL/min (A) (by C-G formula based on SCr of 1.07 mg/dL (H)). Liver Function Tests:  Recent Labs Lab 08/21/17 1846 08/22/17 0358  AST 27 24  ALT 24 21  ALKPHOS 50 45  BILITOT 0.6 0.6  PROT 7.9 7.5  ALBUMIN 3.5 2.9*   No results for input(s): LIPASE, AMYLASE in the last 168 hours.  Recent Labs Lab 08/22/17 0358  AMMONIA 9   Coagulation Profile:  Recent Labs Lab 08/22/17 0358  INR 1.14   Cardiac Enzymes: No results for input(s): CKTOTAL, CKMB, CKMBINDEX, TROPONINI in the last 168 hours. BNP (last 3 results) No results for input(s): PROBNP in the last 8760 hours. HbA1C:  Recent Labs  08/22/17 0358  HGBA1C 5.9*   CBG:  Recent Labs Lab 08/22/17 2223 08/22/17 2301 08/23/17 0522 08/23/17 0752 08/23/17 1214  GLUCAP 244* 227* 206* 186* 135*   Lipid Profile: No results for input(s): CHOL, HDL, LDLCALC, TRIG, CHOLHDL, LDLDIRECT in the last 72 hours. Thyroid Function Tests:  Recent Labs  08/22/17 0358  TSH 1.517   Anemia Panel:  Recent Labs  08/22/17 0358  VITAMINB12 857  FOLATE 22.5  FERRITIN 150  TIBC 307  IRON 19*  RETICCTPCT 1.6   Urine analysis:    Component Value Date/Time   COLORURINE YELLOW 08/21/2017 1915   APPEARANCEUR CLEAR 08/21/2017 1915   LABSPEC 1.005 08/21/2017 1915   LABSPEC 1.010 02/12/2009 1035   PHURINE 5.5 08/21/2017 1915   GLUCOSEU NEGATIVE 08/21/2017 1915   HGBUR NEGATIVE 08/21/2017 1915   BILIRUBINUR NEGATIVE 08/21/2017 1915   BILIRUBINUR Negative 02/12/2009 [place] 08/21/2017 1915   PROTEINUR NEGATIVE 08/21/2017 1915   UROBILINOGEN 0.2 08/14/2015 1400   NITRITE NEGATIVE 08/21/2017 1915   LEUKOCYTESUR SMALL (A) 08/21/2017 1915    LEUKOCYTESUR Large 02/12/2009 1035   Sepsis Labs: @LABRCNTIP (procalcitonin:4,lacticidven:4)  ) Recent Results (from the past 240 hour(s))  Urine culture     Status: None   Collection Time: 08/21/17  7:15 PM  Result Value Ref Range Status   Specimen Description URINE, CLEAN CATCH  Final   Special Requests NONE  Final   Culture   Final    NO GROWTH Performed at [facility], [place] [address], [place], [place] [address]    Report Status 08/23/2017 FINAL  Final      Radiology Studies: Dg Chest 2 View  Result Date: 08/21/2017 CLINICAL DATA:  Weakness, lethargy. EXAM: CHEST  2 VIEW COMPARISON:  Radiographs of July 22, 2017. FINDINGS: The heart size and mediastinal contours are within normal limits. Both lungs are clear. No pneumothorax or pleural effusion is noted. The visualized skeletal structures are unremarkable. IMPRESSION: No active cardiopulmonary disease. Electronically Signed   By: Marijo Conception, M.D.   On: 08/21/2017 18:16   Ct Head Wo Contrast  Result Date: 08/21/2017 CLINICAL DATA:  Altered level of consciousness EXAM: CT HEAD WITHOUT CONTRAST TECHNIQUE: Contiguous axial images were obtained from the base of the skull through the vertex without intravenous contrast. COMPARISON:  02/02/2017 FINDINGS: Brain: No evidence of acute infarction, hemorrhage, hydrocephalus, extra-axial collection or mass lesion/mass effect. Subcortical white matter and periventricular small vessel ischemic changes. Vascular: Intracranial atherosclerosis. Skull: Normal. Negative for fracture or focal lesion. Sinuses/Orbits: The visualized paranasal sinuses are essentially clear. The mastoid air cells are unopacified. Other: None. IMPRESSION: No evidence of acute intracranial abnormality. Small vessel ischemic changes. Electronically Signed   By: Julian Hy M.D.   On: 08/21/2017 18:19   US Renal  Result Date: 08/23/2017 CLINICAL DATA:  Continued surveillance of hydronephrosis. EXAM: RENAL /  URINARY TRACT ULTRASOUND COMPLETE COMPARISON:  CT of the abdomen and pelvis 08/21/2017. FINDINGS: Right Kidney: Length: 11.3 cm. Echogenicity  within normal limits. No mass or hydronephrosis visualized. Left Kidney: Length: 10.4 cm. Echogenicity within normal limits. No mass or hydronephrosis visualized. Bladder: Decompressed with Foley catheter. IMPRESSION: Hydronephrosis has resolved after placement of a Foley catheter. Presumed bladder outlet obstruction of unknown cause. Electronically Signed   By: Staci Righter M.D.   On: 08/23/2017 08:42   Ct Renal Stone Study  Result Date: 08/21/2017 CLINICAL DATA:  Flank pain.  Weakness and lethargy. EXAM: CT ABDOMEN AND PELVIS WITHOUT CONTRAST TECHNIQUE: Multidetector CT imaging of the abdomen and pelvis was performed following the standard protocol without IV contrast. COMPARISON:  CT 08/04/2017 FINDINGS: Lower chest: Mild breathing motion artifact. No consolidation. Minimal right lung base atelectasis. Hepatobiliary: The liver is prominent size spanning 18.7 cm cranial caudal. No focal hepatic lesion allowing for lack contrast. Mild capsular nodularity. Postcholecystectomy with unchanged biliary prominence from prior exam. No calcified choledocholithiasis. Pancreas: Parenchymal atrophy. No ductal dilatation or inflammation. Spleen: Normal in size without focal abnormality. Adrenals/Urinary Tract: No adrenal nodule. Moderate to severe right hydronephrosis and hydroureter with ureteric tortuosity, similar in appearance to prior exam. Mild thinning of the renal parenchyma. No urolithiasis or cause of obstruction. Moderate left hydronephrosis, ureteral distension and tortuosity, new from prior exam. No stones or cause of obstruction. The urinary bladder is distended.  No bladder wall thickening. Stomach/Bowel: Large midline lower ventral abdominal wall hernia with diastasis recti. Hernia contains transverse colon and small bowel. Distal portion of stomach also extends into  the upper portion of the abdominal wall defect. There is no bowel obstruction, inflammation or wall thickening. Colonic tortuosity with moderate colonic stool burden, similar to prior exam. Normal appendix. Vascular/Lymphatic: Dense aortic atherosclerosis. No aneurysm. No adenopathy. Reproductive: Status post hysterectomy. No adnexal masses. Other: No free air, free fluid, or intra-abdominal fluid collection. Musculoskeletal: Degenerative and postsurgical change in the lumbar spine. Chronic anterolisthesis of L4 on L5 and chronic bony remottling L5, unchanged from prior exam and lumbar spine myelogram 04/15/2016. IMPRESSION: 1. New moderate left hydroureteronephrosis without identifiable cause. Ureter is dilated throughout its course. Mild increase in right hydroureteronephrosis from prior exam. Urinary bladder is distended. No urolithiasis. The cause of urinary obstruction is unclear. Consider cystoscopy. 2. Large midline lower ventral abdominal wall hernia and diastasis recti containing colon, small bowel and portions of the stomach, no obstruction or inflammation. 3. Nodular hepatic contours raise concern for cirrhosis. Recommend correlation with cirrhosis risk factors. 4.  Aortic Atherosclerosis (ICD10-I70.0). Electronically Signed   By: Jeb Levering M.D.   On: 08/21/2017 22:29     Scheduled Meds: . aspirin EC  81 mg Oral Daily  . bisacodyl  10 mg Rectal Once  . enoxaparin (LOVENOX) injection  40 mg Subcutaneous Q24H  . escitalopram  20 mg Oral Daily  . ferrous sulfate  325 mg Oral TID WC  . gabapentin  600 mg Oral Q4H  . guaiFENesin  600 mg Oral BID  . insulin aspart  0-9 Units Subcutaneous Q4H  . insulin glargine  30 Units Subcutaneous QHS  . metaxalone  800 mg Oral TID  . metoprolol succinate  50 mg Oral Daily  . nortriptyline  10 mg Oral QHS  . oxyCODONE  15 mg Oral Q12H  . pantoprazole  40 mg Oral Daily  . polyethylene glycol  17 g Oral BID  . rosuvastatin  5 mg Oral QHS  . senna  1  tablet Oral BID  . tapentadol  50 mg Oral Q6H   Continuous Infusions: . cefTRIAXone (ROCEPHIN) IVPB  1 gram/50 mL D5W Stopped (08/22/17 2339)     LOS: 1 day    Time spent: 30 min    Janece Canterbury, MD [facility] Pager [phone]  If 7PM-7AM, please contact night-coverage [email or web address] Password [email or web address] 08/23/2017, 4:12 PM

## 2017-08-23 NOTE — Evaluation (Signed)
Physical Therapy Evaluation Patient Details Name: Lauren [name] MRN: [id number] DOB: [birth year] Today's Date: 08/23/2017   History of Present Illness  SADDIE SANDEEN is a 66 y.o. female with medical history significant of hypertension, hyperlipidemia, DM 2, AVM, CAD, COPD, DDD, Endometrial cancer, GERD who was recently found to have right hydronephrosis and was going to follow up with Dr. Karsten Ro on 8/27.  Prior to coming to the hospital she had increased lower extremity edema, fatigue, and generalized weakness that progressed until she was unable to ambulate.  In the ER, she was found to have acute on chronic urinary retention, and now has bilateral hydronephrosis and AKI.  Pt has a history of chronic pain with narcotic dependence.   Clinical Impression  Pt with great weakness, decreased ROM (especially in DF in ankle) and pain limiting her functional mobility to benefit from further acute PT and SNF depending on her progress here in acute care.    Limited evaluation /assessment performed today at 3:00pm due to pt unable to participate due to pain. Spent 30 minutes with husband present as well, educating and attempting to help to move patient to EOB. No matter how we tried , pt stated it was too painful and she could not attempt to continue to try more at this time. She stated pain was in her abdomen and in her back and down the back of her legs and some in the front of her legs. Husband and I were concerned for patient stated she may have to have a BM however refusing to use the bedpan.    I did discuss that we would continue to follow her and attempt to help her with mobility, however if she was not able to move, that her husband would not be able to give her that much assistance at home.  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  Follow Up Recommendations SNF    Equipment  Recommendations  None recommended by PT    Recommendations for Other Services       Precautions / Restrictions        Mobility  Bed Mobility Overal bed mobility: Needs Assistance Bed Mobility: Rolling;Sidelying to Sit;Supine to Sit Rolling: Max assist;+2 for physical assistance Sidelying to sit: Max assist;+2 for physical assistance Supine to sit: Max assist;+2 for physical assistance     General bed mobility comments: attempted all of these however was not able to complete any of these. s pateint was NOT ABLE to come to EOB today. with usband tried talking and workign with assisting patient in many ways however once we would barely try to move and assist her she would cry out in pain and state she could not do it. WE continued to educate and husband did as well on the need and importance of trying to move and sitting EOB , as well as trying to pivot to [facility] for toileting (due to pt reported she needed to have a BM)  Transfers Overall transfer level:  (unable to perform today due to pain. )                  Ambulation/Gait                Stairs            Wheelchair Mobility    Modified Rankin (Stroke Patients Only)       Balance  Pertinent Vitals/Pain Pain Assessment: 0-10 Pain Score: 10-Worst pain ever (during any attempt to help her move , she screamed out in pain in many different places ) Pain Location: at times where we were trying to assist her at her shoulders to help move in bed, both her legs all over, her back  and abdomen Pain Descriptors / Indicators: Aching;Burning;Sharp Pain Intervention(s): Limited activity within patient's tolerance;Monitored during session;Other (comment) (husband and I attempted different assists, and different hand placements, and attempts to reposition and nothing really helped per patient)    Home Living Family/patient expects to be discharged to::  Private residence Living Arrangements: Spouse/significant other Available Help at Discharge: Family;Available PRN/intermittently Type of Home: House Home Access: Stairs to enter   [employer] of Steps: 2 Home Layout: One level Home Equipment: Walker - 2 wheels;Transport chair      Prior Function Level of Independence: Needs assistance   Gait / Transfers Assistance Needed: pt sleeps in her recliner and uses a RW to ambulate short distances.  Uses transport chair for longer distances. Husband helps with a lot of her basic mobility.  this week has not been able to ambulate or move much            Hand Dominance   Dominant Hand: Right    Extremity/Trunk Assessment        Lower Extremity Assessment Lower Extremity Assessment: RLE deficits/detail;LLE deficits/detail;Generalized weakness RLE Deficits / Details: B PF contractures, has some Arom for DF but about 20 degrees from even neutral. Pt would not allow gentle stretch, however her DF is not functional for standing or transfers at this time . other MMT unable to assess due to patient unable to be tested due to pain.  RLE Sensation: history of peripheral neuropathy (pt states periperial neuropathy however also with hypersensitivity with LT. ) LLE Deficits / Details: B PF contractures, has some Arom for DF but about 20 degrees from even neutral. Pt would not allow gentle stretch, however her DF is not functional for standing or transfers at this time . other MMT unable to assess due to patient unable to be tested due to pain LLE Sensation: history of peripheral neuropathy (pt states periperial neuropathy however also with hypersensitivity with LT.)       Communication   Communication: No difficulties  Cognition Arousal/Alertness: Awake/alert Behavior During Therapy: WFL for tasks assessed/performed (however cried out and seemed confused in some of her statements. ) Overall Cognitive Status: Within Functional Limits for  tasks assessed                                        General Comments      Exercises     Assessment/Plan    PT Assessment Patient needs continued PT services  PT Problem List Decreased strength;Decreased range of motion;Decreased activity tolerance;Decreased mobility       PT Treatment Interventions Gait training;Functional mobility training;Therapeutic activities;Therapeutic exercise;Patient/family education    PT Goals (Current goals can be found in the Care Plan section)  Acute Rehab PT Goals Patient Stated Goal: I want to be able to go home  PT Goal Formulation: With patient/family Time For Goal Achievement: 09/06/17 Potential to Achieve Goals: Fair    Frequency Min 3X/week   Barriers to discharge        Co-evaluation  AM-PAC PT "6 Clicks" Daily Activity  Outcome Measure Difficulty turning over in bed (including adjusting bedclothes, sheets and blankets)?: Unable Difficulty moving from lying on back to sitting on the side of the bed? : Unable Difficulty sitting down on and standing up from a chair with arms (e.g., wheelchair, bedside commode, etc,.)?: Unable Help needed moving to and from a bed to chair (including a wheelchair)?: Total Help needed walking in hospital room?: Total Help needed climbing 3-5 steps with a railing? : Total 6 Click Score: 6    End of Session   Activity Tolerance: Patient limited by pain Patient left: in bed;with call bell/phone within reach;with bed alarm set;with family/visitor present Nurse Communication: Mobility status PT Visit Diagnosis: Muscle weakness (generalized) (M62.81);Other abnormalities of gait and mobility (R26.89)    Time: 7829-5621 PT Time Calculation (min) (ACUTE ONLY): 30 min   Charges:   PT Evaluation $PT Eval Moderate Complexity: 1 Mod PT Treatments $Therapeutic Activity: 23-37 mins   PT G Codes:   PT G-Codes **NOT FOR INPATIENT CLASS** Functional Assessment Tool Used:  AM-PAC 6 Clicks Basic Mobility    Clide Dales, PT Pager: [phone] 08/23/2017   Clide Dales 08/23/2017, 8:37 PM

## 2017-08-23 NOTE — Progress Notes (Signed)
Pt spouse called this RN into room. Spouse had pt sitting on side of bed to transfer to toilet. Bed alarm had been turned off. Pt attempting to stand pt but pt is unable to bear weight or use walker. Told spouse that transfer is unsafe and to please wait for help. Spouse refusing gait belt and walker. Pt frustrated and asked to get back into bed. Assisted getting pt back to bed. Educated pt and spouse on safe transfers and keeping bed alarm on. Verbalized understanding

## 2017-08-23 NOTE — Progress Notes (Signed)
Pt unable to turn for enema or suppository. Administered prn pain medication and scheduled pain meds. Pt continues to refuse turn and asks that staff not touch her

## 2017-08-24 ENCOUNTER — Encounter (HOSPITAL_COMMUNITY): Admission: RE | Payer: Self-pay | Source: Ambulatory Visit

## 2017-08-24 ENCOUNTER — Ambulatory Visit (HOSPITAL_COMMUNITY): Admission: RE | Admit: 2017-08-24 | Payer: Medicare Other | Source: Ambulatory Visit | Admitting: Urology

## 2017-08-24 LAB — GLUCOSE, CAPILLARY
GLUCOSE-CAPILLARY: 110 mg/dL — AB (ref 65–99)
GLUCOSE-CAPILLARY: 121 mg/dL — AB (ref 65–99)
GLUCOSE-CAPILLARY: 134 mg/dL — AB (ref 65–99)
GLUCOSE-CAPILLARY: 210 mg/dL — AB (ref 65–99)
Glucose-Capillary: 141 mg/dL — ABNORMAL HIGH (ref 65–99)
Glucose-Capillary: 117 mg/dL — ABNORMAL HIGH (ref 65–99)
Glucose-Capillary: 112 mg/dL — ABNORMAL HIGH (ref 65–99)

## 2017-08-24 SURGERY — CYSTOSCOPY/RETROGRADE/URETEROSCOPY
Anesthesia: General | Laterality: Right

## 2017-08-24 MED ORDER — AMLODIPINE BESYLATE 5 MG PO TABS
5.0000 mg | ORAL_TABLET | Freq: Every day | ORAL | Status: DC
  Administered 2017-08-24 – 2017-08-25 (×2): 5 mg via ORAL
  Filled 2017-08-24 (×2): qty 1

## 2017-08-24 NOTE — Progress Notes (Signed)
PROGRESS NOTE  Lauren [name]  GUR:[id number] DOB: 04/02/1951 DOA: 08/21/2017 PCP: [name], MD  Brief Narrative:  Lauren [name] is a 66 y.o. female with medical history significant of hypertension, hyperlipidemia, DM 2, AVM, CAD, COPD, DDD, Endometrial cancer, GERD who was recently found to have right hydronephrosis and was going to follow up with Dr. Karsten Ro on 8/27.  She has had increase lower extremity edema and fatigue and generalized weakness that progressed until she was unable to ambulate.  In the ER, she was found to have acute on chronic urinary retention and foley catheter was placed with removal of 1.3 L of urine.  She had some related AKI.  I suspect that she has had urinary retention for a while contributing to her weakness and swelling.  Her creatinine has not improved much since foley placement but will give it another 24 hours.  She now has bilateral hydronephrosis.  We will repeat a renal US tomorrow and if stable to improved and if creatinine trending down, she can be discharged with a foley catheter with outpatient follow up with Urology in 2 weeks.  Pains have improved somewhat. Patient is more alert today. Occupational therapy recommending skilled nursing facility. Patient is amenable to placement.   Assessment & Plan:   Active Problems:   Essential hypertension, benign   Malignant neoplasm of corpus uteri, except isthmus (HCC)   DM2 (diabetes mellitus, type 2) (HCC)   COPD GOLD III/IV   Tobacco abuse   Coronary artery calcification of native artery   Narcotic dependence ([place])   Diabetes mellitus due to underlying condition with diabetic peripheral angiopathy without gangrene ([place])   Acute encephalopathy   Acute urinary retention   AKI (acute kidney injury) ([place])  Acute encephalopathy due to urinary retention and now narcotic withdrawal, improved -  Continue gabapentin and nucyinta -  Unable to eat or drink yesterday due to encephalopathy and  pain  Acute urinary retention -  Continue urinary catheter at discharge -  Repeat RUS:  Hydronephrosis resolved -  D/c myrbetriq and vesicare -  urine culture NGF  Cellulitis of the left shin vs. Stasis dermatitis, markedly improved -  Continue ceftriaxone day 3, plan to stop at discharge  AKI likely due to urinary retention, 1.65 mg/dl at admission, baseline 0.85, trending down -  Minimize nephrotoxins (ARB and diuretics) and renally dose medications -  Repeat BMP in AM  Lower extremity edema likely related to a combination of urinary retention and grade 2 DD/chronic diastolic heart failure -  Lower extremity duplex pending negative for DVT -  ECHO:  EF 55-60% with grade 2 DD and increased filling pressure.  trivial MVR -  Continue to hold lasix  -  autodiuresed 1.8L yesterday  Essential hypertension, BP elevated - continue metoprolol - holding ARB -  Start norvasc  Diabetes mellitus type 2, CBG well controlled probably due to accumulation of insulin in setting of AKI -  Continue reduced lantus 30 units -  Continue low dose SSI  COPD gold III/IV, stable on room air  Chronic pain due to sciatica and disc problems.  Limited mobility at baseline -  Continuued oxycontin and percocet -  PT/OT, anticipate they will recommend SNF -  Agree with outpatient Neurology follow up -  Resumed nucynta, gabapentin, and metaxalone  Grade 2 DD on ECHO, chronic diastolic heart failure -  F/u with cardiology  DVT prophylaxis:  lovenox Code Status:  full Family Communication:  Patient and her husband Disposition  Plan:  Discharge to SNF tomorrow.  Worked with OT today and seems to be eating/drinking better, more alert.  Consultants:   Spoke with Dr. Tresa Moore, urology, by phone  Procedures:  none  Antimicrobials:  Anti-infectives    Start     Dose/Rate Route Frequency Ordered Stop   08/22/17 2200  cefTRIAXone (ROCEPHIN) 1 g in dextrose 5 % 50 mL IVPB     1 g 100 mL/hr over 30 Minutes  Intravenous Every 24 hours 08/22/17 0309     08/21/17 2345  cefTRIAXone (ROCEPHIN) 1 g in dextrose 5 % 50 mL IVPB     1 g 100 mL/hr over 30 Minutes Intravenous  Once 08/21/17 2340 08/22/17 0351       Subjective:  Still having some abdominal pains but they are better now that she is lying on her left side. She denies difficult breathing, chest pains, nausea, vomiting. She was very sweaty and anxious yesterday. Her diaphoresis has improved.  Objective: Vitals:   08/24/17 0028 08/24/17 0112 08/24/17 0541 08/24/17 1335  BP: (!) 195/73 (!) 173/78 (!) 170/70 (!) 138/45  Pulse: 73 76 81 74  Resp:  18 18 18   Temp:   98.4 F (36.9 C) 98.7 F (37.1 C)  TempSrc:   Oral Oral  SpO2:  100% 97% 95%  Weight:      Height:        Intake/Output Summary (Last 24 hours) at 08/24/17 1919 Last data filed at 08/24/17 0700  Gross per 24 hour  Intake               50 ml  Output             1220 ml  Net            -1170 ml   Filed Weights   08/21/17 1724 08/22/17 0343  Weight: 88.5 kg (195 lb) 89.7 kg (197 lb 12 oz)    Examination:  General exam:  Adult Female.  No acute distress.  HEENT:  NCAT, MMM Respiratory system: Clear to auscultation bilaterally Cardiovascular system: Regular rate and rhythm, normal S1/S2. No murmurs, rubs, gallops or clicks.  Warm extremities Gastrointestinal system: Normal active bowel sounds, soft, mildly distended with a large abdominal wall hernia with reducible contents, nontender. MSK:  Normal tone and bulk, 2+ pitting edema of the right lower extremity, left shin the erythematous area has some is completely resolved Neuro:  4-5 bilateral upper extremity strength, 3-5 bilateral lower extremity strength   Data Reviewed: I have personally reviewed following labs and imaging studies  CBC:  Recent Labs Lab 08/18/17 1200 08/21/17 1846 08/22/17 0358 08/23/17 0827  WBC 9.7 16.2* 12.6* 12.6*  HGB 9.9* 9.3* 9.6* 9.4*  HCT 31.2* 28.6* 30.3* 28.7*  MCV 94.3  93.2 93.5 92.3  PLT 356 339 442* 762*   Basic Metabolic Panel:  Recent Labs Lab 08/18/17 1200 08/21/17 1846 08/22/17 0358 08/23/17 0827  NA 140 135 138 135  K 4.7 3.8 3.3* 3.6  CL 100* 97* 97* 100*  CO2 31 26 30 27   GLUCOSE 92 97 108* 171*  BUN 28* 31* 30* 19  CREATININE 1.44* 1.66* 1.65* 1.07*  CALCIUM 9.4 9.2 9.3 9.0  MG  --   --  1.5*  --   PHOS  --   --  4.6  --    GFR: Estimated Creatinine Clearance: 57.6 mL/min (A) (by C-G formula based on SCr of 1.07 mg/dL (H)). Liver Function Tests:  Recent Labs Lab  08/21/17 1846 08/22/17 0358  AST 27 24  ALT 24 21  ALKPHOS 50 45  BILITOT 0.6 0.6  PROT 7.9 7.5  ALBUMIN 3.5 2.9*   No results for input(s): LIPASE, AMYLASE in the last 168 hours.  Recent Labs Lab 08/22/17 0358  AMMONIA 9   Coagulation Profile:  Recent Labs Lab 08/22/17 0358  INR 1.14   Cardiac Enzymes: No results for input(s): CKTOTAL, CKMB, CKMBINDEX, TROPONINI in the last 168 hours. BNP (last 3 results) No results for input(s): PROBNP in the last 8760 hours. HbA1C:  Recent Labs  08/22/17 0358  HGBA1C 5.9*   CBG:  Recent Labs Lab 08/24/17 0111 08/24/17 0539 08/24/17 0820 08/24/17 1151 08/24/17 1705  GLUCAP 141* 117* 112* 121* 110*   Lipid Profile: No results for input(s): CHOL, HDL, LDLCALC, TRIG, CHOLHDL, LDLDIRECT in the last 72 hours. Thyroid Function Tests:  Recent Labs  08/22/17 0358  TSH 1.517   Anemia Panel:  Recent Labs  08/22/17 0358  VITAMINB12 857  FOLATE 22.5  FERRITIN 150  TIBC 307  IRON 19*  RETICCTPCT 1.6   Urine analysis:    Component Value Date/Time   COLORURINE YELLOW 08/21/2017 1915   APPEARANCEUR CLEAR 08/21/2017 1915   LABSPEC 1.005 08/21/2017 1915   LABSPEC 1.010 02/12/2009 1035   PHURINE 5.5 08/21/2017 1915   GLUCOSEU NEGATIVE 08/21/2017 1915   HGBUR NEGATIVE 08/21/2017 1915   BILIRUBINUR NEGATIVE 08/21/2017 1915   BILIRUBINUR Negative 02/12/2009 [place] 08/21/2017  1915   PROTEINUR NEGATIVE 08/21/2017 1915   UROBILINOGEN 0.2 08/14/2015 1400   NITRITE NEGATIVE 08/21/2017 1915   LEUKOCYTESUR SMALL (A) 08/21/2017 1915   LEUKOCYTESUR Large 02/12/2009 1035   Sepsis Labs: @LABRCNTIP (procalcitonin:4,lacticidven:4)  ) Recent Results (from the past 240 hour(s))  Urine culture     Status: None   Collection Time: 08/21/17  7:15 PM  Result Value Ref Range Status   Specimen Description URINE, CLEAN CATCH  Final   Special Requests NONE  Final   Culture   Final    NO GROWTH Performed at [facility], [place] [address], [place], [place] [address]    Report Status 08/23/2017 FINAL  Final      Radiology Studies: US Renal  Result Date: 08/23/2017 CLINICAL DATA:  Continued surveillance of hydronephrosis. EXAM: RENAL / URINARY TRACT ULTRASOUND COMPLETE COMPARISON:  CT of the abdomen and pelvis 08/21/2017. FINDINGS: Right Kidney: Length: 11.3 cm. Echogenicity within normal limits. No mass or hydronephrosis visualized. Left Kidney: Length: 10.4 cm. Echogenicity within normal limits. No mass or hydronephrosis visualized. Bladder: Decompressed with Foley catheter. IMPRESSION: Hydronephrosis has resolved after placement of a Foley catheter. Presumed bladder outlet obstruction of unknown cause. Electronically Signed   By: Staci Righter M.D.   On: 08/23/2017 08:42     Scheduled Meds: . aspirin EC  81 mg Oral Daily  . bisacodyl  10 mg Rectal Once  . enoxaparin (LOVENOX) injection  40 mg Subcutaneous Q24H  . escitalopram  20 mg Oral Daily  . ferrous sulfate  325 mg Oral TID WC  . gabapentin  600 mg Oral Q4H  . guaiFENesin  600 mg Oral BID  . insulin aspart  0-9 Units Subcutaneous Q4H  . insulin glargine  30 Units Subcutaneous QHS  . metaxalone  800 mg Oral TID  . metoprolol succinate  50 mg Oral Daily  . nortriptyline  10 mg Oral QHS  . oxyCODONE  15 mg Oral Q12H  . pantoprazole  40 mg Oral  Daily  . polyethylene glycol  17 g Oral BID  . rosuvastatin  5 mg  Oral QHS  . senna  1 tablet Oral BID  . tapentadol  50 mg Oral Q6H   Continuous Infusions: . cefTRIAXone (ROCEPHIN) IVPB 1 gram/50 mL D5W Stopped (08/23/17 2219)     LOS: 2 days    Time spent: 30 min    Janece Canterbury, MD [facility] Pager [phone]  If 7PM-7AM, please contact night-coverage [email or web address] Password [facility] 08/24/2017, 7:19 PM

## 2017-08-24 NOTE — Evaluation (Signed)
Occupational Therapy Evaluation Patient Details Name: Lauren [name] MRN: [id number] DOB: [birth year] Today's Date: 08/24/2017    History of Present Illness Lauren [name] is a 66 y.o. female with medical history significant of hypertension, hyperlipidemia, DM 2, AVM, CAD, COPD, DDD, Endometrial cancer, GERD who was recently found to have right hydronephrosis and was going to follow up with Dr. Karsten Ro on 8/27.  Prior to coming to the hospital she had increased lower extremity edema, fatigue, and generalized weakness that progressed until she was unable to ambulate.  In the ER, she was found to have acute on chronic urinary retention, and now has bilateral hydronephrosis and AKI.  Pt has a history of chronic pain with narcotic dependence.    Clinical Impression   Pt admitted with weakness. Pt currently with functional limitations due to the deficits listed below (see OT Problem List). Pt will benefit from skilled OT to increase their safety and independence with ADL and functional mobility for ADL to facilitate discharge to venue listed below.      Follow Up Recommendations  SNF    Equipment Recommendations  None recommended by OT       Precautions / Restrictions Precautions Precautions: Fall      Mobility Bed Mobility Overal bed mobility: Needs Assistance Bed Mobility: Rolling Rolling: Mod assist;Max assist    Educated on importance of changing positions even if staying in bed. Educated on using the bed rail to A with rolling in bed            Transfers                 General transfer comment: did not perform        ADL either performed or assessed with clinical judgement   ADL Overall ADL's : Needs assistance/impaired     Grooming: Wash/dry face;Minimal assistance;Bed level                                 General ADL Comments: pt only agreed to roll in bed despite much encouragement.   Pt repositionedon L side as she stated her stomach   hurt.  Educated getting OOB might A with pain.                    Pertinent Vitals/Pain Pain Score: 6  Pain Location: stomach Pain Intervention(s): Limited activity within patient's tolerance;Monitored during session;Repositioned     Hand Dominance Right   Extremity/Trunk Assessment Upper Extremity Assessment Upper Extremity Assessment: LUE deficits/detail LUE Deficits / Details: decreased AROM           Communication Communication Communication: No difficulties   Cognition Arousal/Alertness: Awake/alert Behavior During Therapy: WFL for tasks assessed/performed (however cried out and seemed confused in some of her statements. ) Overall Cognitive Status: Within Functional Limits for tasks assessed                                                Home Living Family/patient expects to be discharged to:: Private residence Living Arrangements: Spouse/significant other Available Help at Discharge: Family;Available PRN/intermittently Type of Home: House Home Access: Stairs to enter [employer] of Steps: 2   Home Layout: One level     Bathroom Shower/Tub: Tub/shower unit;Walk-in shower   Bathroom Toilet: Standard (was having installation  of comfort level height this week (16-17inches height))     Home Equipment: Walker - 2 wheels;Transport chair          Prior Functioning/Environment Level of Independence: Needs assistance  Gait / Transfers Assistance Needed: pt sleeps in her recliner and uses a RW to ambulate short distances.  Uses transport chair for longer distances. Husband helps with a lot of her basic mobility.  this week has not been able to ambulate or move much               OT Problem List: Decreased strength;Decreased activity tolerance;Decreased knowledge of use of DME or AE;Pain      OT Treatment/Interventions: Self-care/ADL training;Patient/family education;DME and/or AE instruction    OT Goals(Current goals can  be found in the care plan section)    OT Frequency: Min 2X/week              AM-PAC PT "6 Clicks" Daily Activity     Outcome Measure Help from another person eating meals?: A Little Help from another person taking care of personal grooming?: A Little Help from another person toileting, which includes using toliet, bedpan, or urinal?: Total Help from another person bathing (including washing, rinsing, drying)?: A Lot Help from another person to put on and taking off regular upper body clothing?: Total Help from another person to put on and taking off regular lower body clothing?: Total 6 Click Score: 11   End of Session Nurse Communication: Mobility status  Activity Tolerance: Patient limited by pain Patient left: in bed;with call bell/phone within reach;with bed alarm set;with family/visitor present  OT Visit Diagnosis: Muscle weakness (generalized) (M62.81)                Time: 1030-1043 OT Time Calculation (min): 13 min Charges:  OT General Charges $OT Visit: 1 Procedure OT Evaluation $OT Eval Moderate Complexity: 1 Procedure G-Codes:     Kari Baars, [place]  Payton Mccallum D 08/24/2017, 10:48 AM

## 2017-08-24 NOTE — NC FL2 (Signed)
[place] LEVEL OF CARE SCREENING TOOL     IDENTIFICATION  Patient Name: Lauren [name] Birthdate: 1951/12/03 Sex: female Admission Date (Current Location): 08/21/2017  [facility] and [place] Number:  Herbalist and Address:  [facility],  [place] [address] [place]      Provider Number: [id number]  Attending Physician Name and Address:  Janece Canterbury, MD  Relative Name and Phone Number:       Current Level of Care: Hospital Recommended Level of Care: [place] Prior Approval Number:    Date Approved/Denied:   PASRR Number: [id number]  Discharge Plan: SNF    Current Diagnoses: Patient Active Problem List   Diagnosis Date Noted  . Acute encephalopathy 08/22/2017  . Acute urinary retention 08/22/2017  . AKI (acute kidney injury) ([place]) 08/22/2017  . Diabetes mellitus due to underlying condition with diabetic peripheral angiopathy without gangrene ([place]) 08/15/2017  . PAD (peripheral artery disease) ([place]) 08/15/2017  . Cor pulmonale, chronic ([place]) 08/15/2017  . Acute on chronic diastolic heart failure ([place]) 08/15/2017  . History of cigarette smoking 08/15/2017  . Multiple pulmonary nodules determined by computed tomography of lung 07/31/2017  . 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 Pressure ulcer of left heel, stage 2 02/04/2017  . Altered mental status   . Community acquired pneumonia   . Encephalopathy   . Fall   . Weakness of both lower extremities   . Other chronic pain   . Narcotic dependence ([place])   . Incontinence of feces   . Sepsis ([place]) 02/02/2017  . Carotid stenosis, bilateral 01/30/2017  . Aftercare following surgery of the circulatory system 01/30/2017  . Impingement syndrome of left shoulder 12/17/2016  . Coronary artery calcification of native artery 10/07/2016  . Iron (Fe) deficiency anemia 10/07/2016  . DDD (degenerative disc disease), lumbar 10/03/2016  . Fatigue 10/03/2016  .  Intervertebral disc disease 10/03/2016  . Low back pain 10/03/2016  . Myelopathy ([place]) 10/03/2016  . Prolapsed lumbosacral intervertebral disc 10/03/2016  . Spondylisthesis 10/03/2016  . MRSA (methicillin resistant staph aureus) culture positive 10/10/2015  . Carotid stenosis 08/17/2015  . Preoperative cardiovascular examination 08/10/2015  . Tobacco abuse 08/10/2015  . 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 Acute osteomyelitis of spine ([place]) 08/28/2014  . Difficult or painful urination 08/28/2014  . H/O Spinal surgery 08/28/2014  . Elevated WBC count 08/28/2014  . Encounter for aftercare for long-term (current) use of antibiotics 08/28/2014  . N&V (nausea and vomiting) 08/28/2014  . Bladder retention 08/28/2014  . Infection of urinary tract 08/28/2014  . DM2 (diabetes mellitus, type 2) ([place]) 06/15/2013  . Ventral hernia 06/15/2013  . COPD GOLD III/IV 06/15/2013  . DJD (degenerative joint disease) of lumbar spine 06/15/2013  . Depression 06/15/2013  . Exertional angina ([place]) 06/15/2013  . Preoperative evaluation 06/15/2013  . HTN (hypertension) 06/15/2013  . Occlusion and stenosis of carotid artery without mention of cerebral infarction 03/02/2012  . Malignant neoplasm of corpus uteri, except isthmus ([place]) 01/19/2012  . Abnormal EKG 12/07/2011  . Essential hypertension, benign 12/07/2011  . Mixed hyperlipidemia 12/07/2011  . Carotid artery disease without cerebral infarction ([place]) 12/07/2011    Orientation RESPIRATION BLADDER Height & Weight     Self, Time, Situation, Place  Normal Indwelling catheter Weight: 197 lb 12 oz (89.7 kg) Height:  5\' 6"  (167.6 cm)  BEHAVIORAL SYMPTOMS/MOOD NEUROLOGICAL BOWEL NUTRITION STATUS      Continent Diet (carb modified, thin fluid consistency)  AMBULATORY STATUS COMMUNICATION OF  NEEDS Skin   Limited Assist Verbally Normal                       Personal Care Assistance Level of Assistance  Bathing, Feeding, Dressing Bathing Assistance:  Limited assistance Feeding assistance: Independent Dressing Assistance: Limited assistance     Functional Limitations Info  Sight, Hearing, Speech Sight Info: Adequate Hearing Info: Adequate Speech Info: Adequate    SPECIAL CARE FACTORS FREQUENCY  PT (By licensed PT), OT (By licensed OT)     PT Frequency: 5x OT Frequency: 5x            Contractures Contractures Info: Not present    Additional Factors Info  Code Status, Allergies Code Status Info: full code Allergies Info: Ambien Zolpidem Tartrate, Sulfa Antibiotics, Elavil Amitriptyline Hcl, Prozac Fluoxetine Hcl           Current Medications (08/24/2017):  This is the current hospital active medication list Current Facility-Administered Medications  Medication Dose Route Frequency Provider Last Rate Last Dose  . acetaminophen (TYLENOL) tablet 650 mg  650 mg Oral Q6H PRN [name], MD       Or  . acetaminophen (TYLENOL) suppository 650 mg  650 mg Rectal Q6H PRN Doutova, Anastassia, MD      . albuterol (PROVENTIL) (2.5 MG/3ML) 0.083% nebulizer solution 2.5 mg  2.5 mg Nebulization Q2H PRN Doutova, Anastassia, MD      . aspirin EC tablet 81 mg  81 mg Oral Daily Doutova, Anastassia, MD   81 mg at 08/24/17 0936  . bisacodyl (DULCOLAX) suppository 10 mg  10 mg Rectal Daily PRN [name], MD      . bisacodyl (DULCOLAX) suppository 10 mg  10 mg Rectal Once Short, Mackenzie, MD      . cefTRIAXone (ROCEPHIN) 1 g in dextrose 5 % 50 mL IVPB  1 g Intravenous Q24H [name], MD   Stopped at 08/23/17 2219  . enoxaparin (LOVENOX) injection 40 mg  40 mg Subcutaneous Q24H Janece Canterbury, MD   40 mg at 08/23/17 1839  . escitalopram (LEXAPRO) tablet 20 mg  20 mg Oral Daily Doutova, Anastassia, MD   20 mg at 08/24/17 0935  . ferrous sulfate tablet 325 mg  325 mg Oral TID WC Janece Canterbury, MD   325 mg at 08/24/17 0935  . gabapentin (NEURONTIN) capsule 600 mg  600 mg Oral Q4H Janece Canterbury, MD   600 mg at  08/24/17 0944  . guaiFENesin (MUCINEX) 12 hr tablet 600 mg  600 mg Oral BID [name], MD   600 mg at 08/24/17 0936  . hydrALAZINE (APRESOLINE) injection 10 mg  10 mg Intravenous Q4H PRN Janece Canterbury, MD   10 mg at 08/24/17 0030  . HYDROcodone-acetaminophen (NORCO/VICODIN) 5-325 MG per tablet 1-2 tablet  1-2 tablet Oral Q4H PRN [name], MD   2 tablet at 08/23/17 1313  . insulin aspart (novoLOG) injection 0-9 Units  0-9 Units Subcutaneous Q4H [name], MD   1 Units at 08/24/17 0115  . insulin glargine (LANTUS) injection 30 Units  30 Units Subcutaneous QHS [name], MD   30 Units at 08/23/17 2147  . metaxalone (SKELAXIN) tablet 800 mg  800 mg Oral TID Janece Canterbury, MD   800 mg at 08/24/17 0935  . metoprolol succinate (TOPROL-XL) 24 hr tablet 50 mg  50 mg Oral Daily Doutova, Anastassia, MD   50 mg at 08/24/17 0934  . nortriptyline (PAMELOR) capsule 10 mg  10 mg Oral QHS  Janece Canterbury, MD   10 mg at 08/23/17 2147  . ondansetron (ZOFRAN) tablet 4 mg  4 mg Oral Q6H PRN [name], MD   4 mg at 08/24/17 0937   Or  . ondansetron (ZOFRAN) injection 4 mg  4 mg Intravenous Q6H PRN Doutova, Anastassia, MD      . oxyCODONE (OXYCONTIN) 12 hr tablet 15 mg  15 mg Oral Q12H Doutova, Anastassia, MD   15 mg at 08/24/17 0936  . pantoprazole (PROTONIX) EC tablet 40 mg  40 mg Oral Daily [name], MD   40 mg at 08/24/17 0936  . polyethylene glycol (MIRALAX / GLYCOLAX) packet 17 g  17 g Oral Daily PRN Doutova, Anastassia, MD      . polyethylene glycol (MIRALAX / GLYCOLAX) packet 17 g  17 g Oral BID [name], MD   17 g at 08/24/17 0945  . rosuvastatin (CRESTOR) tablet 5 mg  5 mg Oral QHS [name], MD   5 mg at 08/23/17 2147  . senna (SENOKOT) tablet 8.6 mg  1 tablet Oral BID [name], MD   8.6 mg at 08/24/17 0936  . tapentadol (NUCYNTA) tablet 50 mg  50 mg Oral Q6H Janece Canterbury, MD   50 mg at 08/24/17 0541      Discharge Medications: Please see discharge summary for a list of discharge medications.  Relevant Imaging Results:  Relevant Lab Results:   Additional Information SS# [id number]  Nila Nephew, LCSW

## 2017-08-25 ENCOUNTER — Inpatient Hospital Stay (HOSPITAL_COMMUNITY): Payer: Medicare Other

## 2017-08-25 LAB — GLUCOSE, CAPILLARY
GLUCOSE-CAPILLARY: 169 mg/dL — AB (ref 65–99)
Glucose-Capillary: 124 mg/dL — ABNORMAL HIGH (ref 65–99)
Glucose-Capillary: 113 mg/dL — ABNORMAL HIGH (ref 65–99)

## 2017-08-25 MED ORDER — OXYCONTIN 15 MG PO T12A: 15.0000 mg | EXTENDED_RELEASE_TABLET | Freq: Two times a day (BID) | ORAL | 0 refills | Status: AC

## 2017-08-25 MED ORDER — POLYETHYLENE GLYCOL 3350 17 G PO PACK: 17.0000 g | PACK | Freq: Two times a day (BID) | ORAL | 0 refills | Status: DC

## 2017-08-25 MED ORDER — PREDNISONE 20 MG PO TABS
40.0000 mg | ORAL_TABLET | Freq: Every day | ORAL | Status: DC
  Administered 2017-08-25: 40 mg via ORAL
  Filled 2017-08-25: qty 2

## 2017-08-25 MED ORDER — INSULIN ASPART 100 UNIT/ML ~~LOC~~ SOLN: 0.0000 [IU] | Freq: Every day | SUBCUTANEOUS | Status: DC

## 2017-08-25 MED ORDER — INSULIN GLARGINE 100 UNIT/ML ~~LOC~~ SOLN: 30.0000 [IU] | Freq: Every day | SUBCUTANEOUS | 11 refills | Status: DC

## 2017-08-25 MED ORDER — AMLODIPINE BESYLATE 5 MG PO TABS: 5.0000 mg | ORAL_TABLET | Freq: Every day | ORAL | 0 refills | Status: AC

## 2017-08-25 MED ORDER — PREDNISONE 20 MG PO TABS: 40.0000 mg | ORAL_TABLET | Freq: Every day | ORAL | 0 refills | Status: DC

## 2017-08-25 MED ORDER — SENNA 8.6 MG PO TABS: 2.0000 | ORAL_TABLET | Freq: Every day | ORAL | 0 refills | Status: DC

## 2017-08-25 MED ORDER — TAPENTADOL HCL 50 MG PO TABS: 50.0000 mg | ORAL_TABLET | Freq: Four times a day (QID) | ORAL | 0 refills | Status: AC

## 2017-08-25 MED ORDER — INSULIN ASPART 100 UNIT/ML ~~LOC~~ SOLN
0.0000 [IU] | Freq: Three times a day (TID) | SUBCUTANEOUS | Status: DC
  Administered 2017-08-25: 2 [IU] via SUBCUTANEOUS

## 2017-08-25 NOTE — Clinical Social Work Note (Signed)
Clinical Social Work Assessment  Patient Details  Name: Lauren [name] MRN: [id number] Date of Birth: 08/28/1951  Date of referral:  08/25/17               Reason for consult:  Facility Placement                Permission sought to share information with:  Family Supports Permission granted to share information::  Yes, Verbal Permission Granted  Name::     daughter Lauren [name], husband Lauren [name]  Agency::     Relationship::     Contact Information:     Housing/Transportation Living arrangements for the past 2 months:  Single Family Home Source of Information:  Patient, Adult Children, Spouse Patient Interpreter Needed:  None Criminal Activity/Legal Involvement Pertinent to Current Situation/Hospitalization:  No - Comment as needed Significant Relationships:  Adult Children, Neighbor, Friend, Spouse Lives with:  Spouse Do you feel safe going back to the place where you live?  Yes Need for family participation in patient care:  No (Coment)  Care giving concerns:  Pt from home where she resides with her husband. Daughter nearby and highly supportive as well.  At baseline husband helps pt with transfers, pt uses rolling walker independently and uses transfer chair "when feeling weaker." Reports pt has had Home Health services and PT multiple times in the past "and so we have a lot of equipment in the house- the high commode, a recliner that leans forward so I can sit up and sometimes stand on my own." Pt report going to inpatient rehab at [facility] several years ago.    Social Worker assessment / plan:  CSW consulted for assistance with SNF referrals.  Met with pt, husband, and daughter at bedside. Pt alert and oriented, very engaged. Agreed to referrals- CSW completed FL2 and referred to various facilities via the [facility]. Provided bed offers- pt chooses [facility]. Selected facility in the HUB and confirmed bed availability with admissions.  Daughter advised that family is beginning  to contemplate pt moving to ALF and will be considering this going forward as well, following ST rehab.  Plan: [facility] [place] for [place] rehab.  Employment status:  Retired Forensic scientist:  Medicare PT Recommendations:  [place] / Referral to community resources:  [place]  Patient/Family's Response to care:  Appreciative of care  Patient/Family's Understanding of and Emotional Response to Diagnosis, Current Treatment, and Prognosis:  Pt/family demonstrate thorough understanding of treatment and plan- ask pertinent questions related to her planning/condition. Show motivation for participating in rehab and are considering what long term care needs may be for pt as well.   Emotional Assessment Appearance:  Appears older than stated age Attitude/Demeanor/Rapport:   (appropriate, happy) Affect (typically observed):  Accepting, Calm, Happy Orientation:  Oriented to Self, Oriented to Place, Oriented to  Time, Oriented to Situation Alcohol / Substance use:  Not Applicable Psych involvement (Current and /or in the community):  No (Comment)  Discharge Needs  Concerns to be addressed:  Discharge Planning Concerns Readmission within the last 30 days:  No Current discharge risk:  None Barriers to Discharge:  No Barriers Identified   Nila Nephew, LCSW 08/25/2017, 12:08 PM  [phone]

## 2017-08-25 NOTE — Progress Notes (Signed)
[facility] transported to [facility], awake, alert and oriented.TRied to call [facility] several times and left messages, unable to get in touch with anyone to give hand off report.

## 2017-08-25 NOTE — Care Management Important Message (Signed)
Important Message  Patient Details  Name: Lauren [name] MRN: [id number] Date of Birth: Mar 15, 1951   Medicare Important Message Given:  Yes    Kerin Salen 08/25/2017, 10:21 AMImportant Message  Patient Details  Name: Lauren [name] MRN: [id number] Date of Birth: [birth year]   Medicare Important Message Given:  Yes    Kerin Salen 08/25/2017, 10:18 AM

## 2017-08-25 NOTE — Clinical Social Work Placement (Signed)
Pt discharging today to transfer to SNF [facility] room 611A. Report #[id number]. Pt will transport via [facility]_ CSW completed medical necessity form and arranged transport Lauren at bedside aware and agreeable to plan All information sent to facility via the [place] See below for placement details   CLINICAL SOCIAL WORK PLACEMENT  NOTE  Date:  08/25/2017  Patient Details  Name: Lauren [name] MRN: [id number] Date of Birth: 07-Nov-1951  Clinical Social Work is seeking post-discharge placement for this patient at the [place] level of care (*CSW will initial, date and re-position this form in  chart as items are completed):  Yes   Patient/family provided with [place] Work Department's list of facilities offering this level of care within the geographic area requested by the patient (or if unable, by the patient's family).  Yes   Patient/family informed of their freedom to choose among providers that offer the needed level of care, that participate in Medicare, Medicaid or managed care program needed by the patient, have an available bed and are willing to accept the patient.  Yes   Patient/family informed of [facility]'s ownership interest in [facility] and [facility], as well as of the fact that they are under no obligation to receive care at these facilities.  PASRR submitted to EDS on       PASRR number received on       Existing PASRR number confirmed on 08/24/17     FL2 transmitted to all facilities in geographic area requested by pt/family on 08/24/17     FL2 transmitted to all facilities within larger geographic area on       Patient informed that his/her managed care company has contracts with or will negotiate with certain facilities, including the following:        Yes   Patient/family informed of bed offers received.  Patient chooses bed at [facility]     Physician recommends and patient chooses bed at [facility]     Patient to be transferred to [facility] on 08/25/17.  Patient to be transferred to facility by [facility]     Patient family notified on 08/25/17 of transfer.  Name of family member notified:  Lauren [name], Lauren [name]     PHYSICIAN       Additional Comment:    _______________________________________________ Nila Nephew, LCSW 08/25/2017, 12:20 PM  [phone]

## 2017-08-25 NOTE — Discharge Summary (Signed)
Physician Discharge Summary  [name] WVP:[id number] DOB: 16-Oct-1951 DOA: 08/21/2017  PCP: [name], MD  Admit date: 08/21/2017 Discharge date: 08/25/2017  Admitted From: Home  Disposition:  Skilled nursing facility  Recommendations for Outpatient Follow-up:  1. Follow up with Dr. Karsten Ro, urology in 2 weeks 2. Continue prednisone for 5 days, last day on September 1 3. Cardiology, Dr. Sallyanne Kuster, in 2 weeks to reassess need for diuretics now that urinary obstruction has been relieved  Home Health:  Ongoing physical and occupational therapy  Equipment/Devices:  Foley catheter  Discharge Condition:  Stable, improved CODE STATUS:  Full code  Diet recommendation:  Diabetic diet   Brief/Interim Summary:  Lauren [name] a 66 y.o.femalewith history of hypertension, hyperlipidemia, DM 2, AVM, CAD, COPD, DDD on high dose narcotics followed at a pain clinic, Endometrial cancer, GERD who was recently found to have right hydronephrosis and was going to follow up with Dr. Karsten Ro on 8/27.  She presented with increased lower extremity edema and fatigue and generalized weakness that progressed until she was unable to ambulate.  In the ER, she was found to have acute on chronic urinary retention with bilateral hydronephrosis and foley catheter was placed with removal of 1.3 L of urine.  She had some related AKI.  I suspect that she has had urinary retention for a while contributing to her weakness and swelling.  She autodiuresed close to 8L over the next few days.  Her creatinine trended down, her swelling improved, and her repeat RUS demonstrated complete resolution of her hydronephrosis.  I suspect that she has chronic urinary retention secondary to her multiple narcotics and other medications that have anticholinergic side effects. I also suspect she suffers from chronic constipation which could be exacerbating the problem. She was seen by urology recently and they started her on  Vesicare and Myrbetriq which may have worsened her retention to the point of acute on chronic requiring Foley catheter placement.    Her hospitalization was complicated by metabolic encephalopathy related to withdrawal from gabapentin and high-dose narcotics. She was admitted, she had some acute kidney injury and her gabapentin and Nucynta were held. At the time she had some altered mental status.  Within 24 hours she became more encephalopathic and developed diffuse severe pains, diaphoresis, anxiety, inability to tolerate by mouth. Her kidney function improved and her gabapentin and Nucynta were resumed.  I reviewed the [place] drug database and based on the prescribing pattern to believe that her new pain clinic is trying to transition her from OxyContin to Dent.  Her withdrawal symptoms improved over 48 hours and she became more alert and oriented and was able to tolerate foods/beverages.    Discharge Diagnoses:  Active Problems:   Essential hypertension, benign   Malignant neoplasm of corpus uteri, except isthmus (HCC)   DM2 (diabetes mellitus, type 2) (HCC)   COPD GOLD III/IV   Tobacco abuse   Coronary artery calcification of native artery   Narcotic dependence ([place])   Diabetes mellitus due to underlying condition with diabetic peripheral angiopathy without gangrene ([place])   Acute encephalopathy   Acute urinary retention   AKI (acute kidney injury) ([place])  Acute encephalopathy due to urinary retention.  Initially resolved and then worsened due to narcotic and gabapentin withdrawal.  Withdrawal encephalopathy took about 2 days to fully clear.   -  Continue gabapentin and nucyinta  Acute urinary retention -  Continue urinary catheter at discharge -  Repeat RUS:  Hydronephrosis resolved -  D/c myrbetriq and vesicare -  urine culture NGF -  Follow up with Dr. Karsten Ro in 2 weeks  Cellulitis of the left shin vs. Stasis dermatitis, resolved.  Swelling improved with autodiuresis.   -  completed 3 days of ceftriaxone and will stop now that erythema has resolved.  AKI likely due to urinary retention, 1.65 mg/dl at admission, baseline 0.85, trended down -  Repeat BMP in 1-2 weeks  Lower extremity edema likely related to a combination of urinary retention and grade 2 DD/chronic diastolic heart failure -  Lower extremity duplex pending negative for DVT -  ECHO:  EF 55-60% with grade 2 DD and increased filling pressure.  trivial MVR -  Holding lasix at discharge as she is continuing to autodiurese -  Negative 712mL yesterday and 1.8L the day before without diuretics  Essential hypertension, BP elevated - continue metoprolol -  Continue to hold ARB -  Started norvasc  Diabetes mellitus type 2, CBG well controlled probably due to accumulation of insulin in setting of AKI.  A1c 5.9 on 08/22/2017 -  Reduced lantus from 45 units to 30 units, but not eating well -  May need to increase insulin now that eating better and starting steroids -  given low dose SSI during hospitalization, but not a home medication  COPD gold III/IV, increased wheezing and cough today -  Already has been on some antibiotics recently -  Start prednisone for 5 days   Chronic pain due to sciatica and disc problems.  Limited mobility at baseline -  Continued oxycontin and percocet -  Will need follow up with her pain clinic within 1-2 weeks -  PT/OT, recommending SNF -  Outpatient Neurology follow up -  Resumed nucynta, gabapentin, and metaxalone  Grade 2 DD on ECHO, chronic diastolic heart failure -  F/u with cardiology in 2 weeks   Discharge Instructions     Medication List    STOP taking these medications   doxycycline 100 MG tablet Commonly known as:  VIBRA-TABS   furosemide 40 MG tablet Commonly known as:  LASIX   losartan 100 MG tablet Commonly known as:  COZAAR   mirabegron ER 50 MG Tb24 tablet Commonly known as:  MYRBETRIQ   mupirocin ointment 2 % Commonly known  as:  BACTROBAN   nitrofurantoin 100 MG capsule Commonly known as:  MACRODANTIN   nystatin 100000 UNIT/ML suspension Commonly known as:  MYCOSTATIN   potassium chloride SA 20 MEQ tablet Commonly known as:  KLOR-CON M20   VESICARE 10 MG tablet Generic drug:  solifenacin     TAKE these medications   ACCU-CHEK AVIVA PLUS test strip Generic drug:  glucose blood 1 each by Other route as needed. Use as instructed   amLODipine 5 MG tablet Commonly known as:  NORVASC Take 1 tablet (5 mg total) by mouth daily.   aspirin EC 81 MG tablet Take 81 mg by mouth daily.   betamethasone dipropionate 0.05 % ointment Commonly known as:  DIPROLENE Apply 1 application topically 2 (two) times daily as needed (psoriasis).   escitalopram 20 MG tablet Commonly known as:  LEXAPRO Take 20 mg by mouth daily.   esomeprazole 40 MG capsule Commonly known as:  NEXIUM Take 40 mg by mouth daily.   ferrous sulfate 325 (65 FE) MG tablet Take 325 mg by mouth daily.   Fluticasone-Salmeterol 250-50 MCG/DOSE Aepb Commonly known as:  ADVAIR Inhale 1 puff into the lungs 2 (two) times daily as needed (shortness of breath).  folic acid 1 MG tablet Commonly known as:  FOLVITE Take 1 mg by mouth daily.   gabapentin 600 MG tablet Commonly known as:  NEURONTIN Take 600 mg by mouth 6 (six) times daily. 6am, 10am, 4pm, 6pm, 10pm, midnight   insulin glargine 100 UNIT/ML injection Commonly known as:  LANTUS Inject 0.3 mLs (30 Units total) into the skin at bedtime. What changed:  how much to take   magnesium oxide 400 MG tablet Commonly known as:  MAG-OX Take 400 mg by mouth daily.   Melatonin 10 MG Tabs Take 10 mg by mouth at bedtime.   metaxalone 800 MG tablet Commonly known as:  SKELAXIN Take 800 mg by mouth 3 (three) times daily. 6am, noon, 6pm   metoprolol succinate 50 MG 24 hr tablet Commonly known as:  TOPROL-XL Take 50 mg by mouth daily.   multivitamin with minerals Tabs tablet Take 1  tablet by mouth daily.   nitroGLYCERIN 0.4 MG SL tablet Commonly known as:  NITROSTAT Place 1 tablet (0.4 mg total) under the tongue every 5 (five) minutes as needed for chest pain.   nortriptyline 10 MG capsule Commonly known as:  PAMELOR Take 10 mg by mouth at bedtime.   ondansetron 4 MG disintegrating tablet Commonly known as:  ZOFRAN-ODT Take 4 mg by mouth every 8 (eight) hours as needed for nausea or vomiting.   OXYCONTIN 15 mg 12 hr tablet Generic drug:  oxyCODONE Take 1 tablet (15 mg total) by mouth every 12 (twelve) hours.   polyethylene glycol packet Commonly known as:  MIRALAX / GLYCOLAX Take 17 g by mouth 2 (two) times daily.   predniSONE 20 MG tablet Commonly known as:  DELTASONE Take 2 tablets (40 mg total) by mouth daily with breakfast.   PROAIR RESPICLICK 474 (90 Base) MCG/ACT Aepb Generic drug:  Albuterol Sulfate Inhale 2 puffs into the lungs 4 (four) times daily as needed (congestion, cough, wheezing).   rosuvastatin 5 MG tablet Commonly known as:  CRESTOR Take 5 mg by mouth at bedtime.   senna 8.6 MG Tabs tablet Commonly known as:  SENOKOT Take 2 tablets (17.2 mg total) by mouth at bedtime.   tapentadol 50 MG tablet Commonly known as:  NUCYNTA Take 1 tablet (50 mg total) by mouth every 6 (six) hours. 6 am, noon, 6pm  And midnight   vitamin B-12 1000 MCG tablet Commonly known as:  CYANOCOBALAMIN Take 1,000 mcg by mouth daily.   Vitamin D3 1000 units Caps Take 1,000 Units by mouth daily.       Contact information for follow-up providers    [name], MD Follow up.   Specialty:  Family Medicine Contact information: [place] [address] [phone]        Starling Manns, MD Follow up.   Specialty:  Orthopedic Surgery Contact information: [address] [place] [address] [phone]            Contact information for after-discharge care    Destination    [facility] Follow up.    Specialty:  [place] information: [address] [phone]                 Allergies  Allergen Reactions  . Ambien [Zolpidem Tartrate] Other (See Comments)    Per the pt, "it makes me go crazy"  . Sulfa Antibiotics Rash  . Elavil [Amitriptyline Hcl] Other (See Comments)    Gastritis  .  Prozac [Fluoxetine Hcl] Anxiety and Other (See Comments)    irritability     Consultations: Dr. Tresa Moore, Urology, by phone  Procedures/Studies: Dg Chest 2 View  Result Date: 08/21/2017 CLINICAL DATA:  Weakness, lethargy. EXAM: CHEST  2 VIEW COMPARISON:  Radiographs of July 22, 2017. FINDINGS: The heart size and mediastinal contours are within normal limits. Both lungs are clear. No pneumothorax or pleural effusion is noted. The visualized skeletal structures are unremarkable. IMPRESSION: No active cardiopulmonary disease. Electronically Signed   By: Marijo Conception, M.D.   On: 08/21/2017 18:16   Ct Head Wo Contrast  Result Date: 08/21/2017 CLINICAL DATA:  Altered level of consciousness EXAM: CT HEAD WITHOUT CONTRAST TECHNIQUE: Contiguous axial images were obtained from the base of the skull through the vertex without intravenous contrast. COMPARISON:  02/02/2017 FINDINGS: Brain: No evidence of acute infarction, hemorrhage, hydrocephalus, extra-axial collection or mass lesion/mass effect. Subcortical white matter and periventricular small vessel ischemic changes. Vascular: Intracranial atherosclerosis. Skull: Normal. Negative for fracture or focal lesion. Sinuses/Orbits: The visualized paranasal sinuses are essentially clear. The mastoid air cells are unopacified. Other: None. IMPRESSION: No evidence of acute intracranial abnormality. Small vessel ischemic changes. Electronically Signed   By: Julian Hy M.D.   On: 08/21/2017 18:19   Ct Abdomen Pelvis W Contrast  Result Date: 08/04/2017 CLINICAL DATA:  Elevated liver enzymes.   Chronic umbilical hernia EXAM: CT ABDOMEN AND PELVIS WITH CONTRAST TECHNIQUE: Multidetector CT imaging of the abdomen and pelvis was performed using the standard protocol following bolus administration of intravenous contrast. Oral contrast was also administered. CONTRAST:  64mL ISOVUE-300 IOPAMIDOL (ISOVUE-300) INJECTION 61% COMPARISON:  July 25, 2014 FINDINGS: Lower chest: There is scarring in the right lung base region. No lung base edema or consolidation. Hepatobiliary: Liver measures 19.5 cm in length. Liver contour is subtly nodular. No focal liver lesions are evident on this study. Gallbladder is absent. There is intrahepatic biliary duct dilatation. Common bile duct measures 11 mm, slightly dilated even for post cholecystectomy state. No mass or calculus is evident in the biliary ductal system by CT. Pancreas: There is no evident pancreatic mass or inflammatory focus. There is mild atrophy in the head of the pancreas with borderline pancreatic duct dilatation in this area, stable. Spleen: No splenic lesions are evident. Adrenals/Urinary Tract: Adrenals bilaterally appear unremarkable. There is no evident renal mass. There is severe hydronephrosis on the right. There is diffuse dilatation of the right ureter to the level of the urinary bladder. This is a finding that was not present on prior study. There is no hydronephrosis on the left. There is no renal or ureteral calculus on either side. Urinary bladder is midline with wall thickness within normal limits. Stomach/Bowel: There is fairly diffuse stool throughout the colon. There is no appreciable bowel wall or mesenteric thickening. No evident bowel obstruction. No free air or portal venous air. Multiple loops of small and large bowel extend into a large ventral hernia without bowel compromise evident. Vascular/Lymphatic: There is atherosclerotic calcification in the aorta and common iliac arteries bilaterally. There is extensive calcification in the common  iliac arteries with what appears to be hemodynamically significant obstruction in these vessels. There is also extensive calcification external and internal iliac arteries as well as in both common and proximal superficial femoral arteries. No aneurysm evident. No adenopathy is appreciable in the abdomen or pelvis. Reproductive: Uterus is absent.  No pelvic mass evident. Other: Appendix appears normal. No ascites or adenopathy is evident in the abdomen  or pelvis. Large ventral hernia is again noted with bowel extending into this hernia. No bowel compromise. No omental lesions are evident on this study. Musculoskeletal: There is postoperative change in the lower lumbar spine with marked bony remodeling, also present on prior study. There is spondylolisthesis of L4 on L5, chronic. No blastic or lytic bone lesions are evident. No intramuscular lesion evident. IMPRESSION: 1. There is rather marked hydronephrosis and ureterectasis on the right without mass or calculus seen in the right renal collecting system or ureter. Urinary bladder is midline. Question recent calculus passage. Hydronephrosis could present in this manner. Given the degree of hydronephrosis on the right, urologic consultation is felt to be advisable. 2. Large ventral hernia again noted. Multiple loops of small and large bowel extend into this hernia without bowel compromise. 3. No bowel obstruction. No abscess. Appendix appears normal. Diffuse stool in colon. 4. Liver is prominent with a subtly nodular contour. Question degree of underlying hepatic cirrhosis. No focal liver lesions are demonstrable. Gallbladder is absent with biliary duct dilatation. No biliary duct mass or calculus evident. 5. Atrophy of the pancreas in the head region with mild prominence of the pancreatic duct in the head region. This appearance is stable compared to the 2015 study. If there is concern for pancreatic lesion in this area, MR pre and post-contrast of the pancreas  could be helpful to further assess. 6. Aortoiliac atherosclerosis. Multiple areas of what appear to be hemodynamically significant obstructing lesions in the common iliac arteries as well as in the distal common femoral arteries. There also appears to be hemodynamically significant obstruction due to calcification at the aortic bifurcation. No aneurysm evident. 7. Postoperative change at the lumbosacral junction with advanced bony remodeling. Spondylolisthesis of L4 on L5 noted. There is postoperative change in this area. 8.  Uterus absent.  No pelvic mass evident. Aortic Atherosclerosis (ICD10-I70.0). These results will be called to the ordering clinician or representative by the Radiologist Assistant, and communication documented in the PACS or zVision Dashboard. Electronically Signed   By: Lowella Grip III M.D.   On: 08/04/2017 17:13   US Renal  Result Date: 08/23/2017 CLINICAL DATA:  Continued surveillance of hydronephrosis. EXAM: RENAL / URINARY TRACT ULTRASOUND COMPLETE COMPARISON:  CT of the abdomen and pelvis 08/21/2017. FINDINGS: Right Kidney: Length: 11.3 cm. Echogenicity within normal limits. No mass or hydronephrosis visualized. Left Kidney: Length: 10.4 cm. Echogenicity within normal limits. No mass or hydronephrosis visualized. Bladder: Decompressed with Foley catheter. IMPRESSION: Hydronephrosis has resolved after placement of a Foley catheter. Presumed bladder outlet obstruction of unknown cause. Electronically Signed   By: Staci Righter M.D.   On: 08/23/2017 08:42   Dg Chest Port 1 View  Result Date: 08/25/2017 CLINICAL DATA:  Increasing lower extremity edema. EXAM: PORTABLE CHEST 1 VIEW COMPARISON:  08/21/2017 FINDINGS: The cardiac silhouette, mediastinal and hilar contours are within normal limits and stable. There is tortuosity and calcification of the thoracic aorta. Stable emphysematous and bronchitic type interstitial lung changes but no infiltrates, edema or effusions. The bony  thorax is intact. IMPRESSION: Chronic lung changes but no acute pulmonary findings. Electronically Signed   By: Marijo Sanes M.D.   On: 08/25/2017 10:57   Ct Renal Stone Study  Result Date: 08/21/2017 CLINICAL DATA:  Flank pain.  Weakness and lethargy. EXAM: CT ABDOMEN AND PELVIS WITHOUT CONTRAST TECHNIQUE: Multidetector CT imaging of the abdomen and pelvis was performed following the standard protocol without IV contrast. COMPARISON:  CT 08/04/2017 FINDINGS: Lower chest: Mild  breathing motion artifact. No consolidation. Minimal right lung base atelectasis. Hepatobiliary: The liver is prominent size spanning 18.7 cm cranial caudal. No focal hepatic lesion allowing for lack contrast. Mild capsular nodularity. Postcholecystectomy with unchanged biliary prominence from prior exam. No calcified choledocholithiasis. Pancreas: Parenchymal atrophy. No ductal dilatation or inflammation. Spleen: Normal in size without focal abnormality. Adrenals/Urinary Tract: No adrenal nodule. Moderate to severe right hydronephrosis and hydroureter with ureteric tortuosity, similar in appearance to prior exam. Mild thinning of the renal parenchyma. No urolithiasis or cause of obstruction. Moderate left hydronephrosis, ureteral distension and tortuosity, new from prior exam. No stones or cause of obstruction. The urinary bladder is distended.  No bladder wall thickening. Stomach/Bowel: Large midline lower ventral abdominal wall hernia with diastasis recti. Hernia contains transverse colon and small bowel. Distal portion of stomach also extends into the upper portion of the abdominal wall defect. There is no bowel obstruction, inflammation or wall thickening. Colonic tortuosity with moderate colonic stool burden, similar to prior exam. Normal appendix. Vascular/Lymphatic: Dense aortic atherosclerosis. No aneurysm. No adenopathy. Reproductive: Status post hysterectomy. No adnexal masses. Other: No free air, free fluid, or intra-abdominal  fluid collection. Musculoskeletal: Degenerative and postsurgical change in the lumbar spine. Chronic anterolisthesis of L4 on L5 and chronic bony remottling L5, unchanged from prior exam and lumbar spine myelogram 04/15/2016. IMPRESSION: 1. New moderate left hydroureteronephrosis without identifiable cause. Ureter is dilated throughout its course. Mild increase in right hydroureteronephrosis from prior exam. Urinary bladder is distended. No urolithiasis. The cause of urinary obstruction is unclear. Consider cystoscopy. 2. Large midline lower ventral abdominal wall hernia and diastasis recti containing colon, small bowel and portions of the stomach, no obstruction or inflammation. 3. Nodular hepatic contours raise concern for cirrhosis. Recommend correlation with cirrhosis risk factors. 4.  Aortic Atherosclerosis (ICD10-I70.0). Electronically Signed   By: Jeb Levering M.D.   On: 08/21/2017 22:29   US Thyroid  Result Date: 08/17/2017 CLINICAL DATA:  Prior ultrasound follow-up.  Right lobe nodule. EXAM: THYROID ULTRASOUND TECHNIQUE: Ultrasound examination of the thyroid gland and adjacent soft tissues was performed. COMPARISON:  09/24/2016 FINDINGS: Parenchymal Echotexture: Mildly heterogenous Isthmus: 0.4 cm, previously 0.3 cm Right lobe: 3.5 x 1.4 x 1.8 cm, previously 4.7 x 1.8 x 1.6 cm Left lobe: 3.7 x 1.2 x 1.4 cm, previously 3.8 x 1.4 x 1.2 cm _________________________________________________________ Estimated total number of nodules >/= 1 cm: 1 Number of spongiform nodules >/=  2 cm not described below (TR1): 0 Number of mixed cystic and solid nodules >/= 1.5 cm not described below (TR2): 0 _________________________________________________________ Right lower pole solid nodule measures 1.9 x 1.2 x 1.2 cm and previously measured 2.0 x 1.4 x 1.4 cm. This underwent biopsy 10/14/2016. Tiny left lobe nodules measure less than 0.5 cm. IMPRESSION: The right lower pole solid nodule is not significantly changed in  did undergo biopsy 10/14/2016. Please correlate with prior biopsy results. The above is in keeping with the ACR TI-RADS recommendations - J Am Coll Radiol 2017;14:587-595. Electronically Signed   By: Marybelle Killings M.D.   On: 08/17/2017 15:40    Subjective:  Feeling much better.  Increased wheeze this morning, but denies SOB.  Has not noticed increased sputum or change in sputum.  Abdominal pains have resolved.  Only having her usual chronic pains.  Eating a little better.  Ready for PT/OT.  Discharge Exam: Vitals:   08/25/17 0416 08/25/17 0608  BP: (!) 183/64 (!) 160/60  Pulse: 79   Resp: 18   Temp:  98.5 F (36.9 C)   SpO2: 96%    Vitals:   08/24/17 1335 08/24/17 2003 08/25/17 0416 08/25/17 0608  BP: (!) 138/45 (!) 124/53 (!) 183/64 (!) 160/60  Pulse: 74 68 79   Resp: 18 16 18    Temp: 98.7 F (37.1 C) 98.2 F (36.8 C) 98.5 F (36.9 C)   TempSrc: Oral Oral Oral   SpO2: 95% 96% 96%   Weight:      Height:        General: Pt is alert, awake, not in acute distress, lying in bed Cardiovascular: RRR, S1/S2 +, no rubs, no gallops Respiratory: wheezing throughout, rales at the left base, no rhonchi Abdominal: Soft, NT, ND, bowel sounds + Extremities: legs are wrinkled from the rapid decrease in swelling over the last few days. no cyanosis.  1+ of the right lower extremity and trace edema of the left.  No more erythema of the left shin.  Neuro:  4/5 upper extremity strength, 3/5 lower extremity.  Needs assistance to move in bed    The results of significant diagnostics from this hospitalization (including imaging, microbiology, ancillary and laboratory) are listed below for reference.     Microbiology: Recent Results (from the past 240 hour(s))  Urine culture     Status: None   Collection Time: 08/21/17  7:15 PM  Result Value Ref Range Status   Specimen Description URINE, CLEAN CATCH  Final   Special Requests NONE  Final   Culture   Final    NO GROWTH Performed at [facility], [address], [place], [place] [address]    Report Status 08/23/2017 FINAL  Final     Labs: BNP (last 3 results)  Recent Labs  08/21/17 2015  BNP 361.4*   Basic Metabolic Panel:  Recent Labs Lab 08/21/17 1846 08/22/17 0358 08/23/17 0827  NA 135 138 135  K 3.8 3.3* 3.6  CL 97* 97* 100*  CO2 26 30 27   GLUCOSE 97 108* 171*  BUN 31* 30* 19  CREATININE 1.66* 1.65* 1.07*  CALCIUM 9.2 9.3 9.0  MG  --  1.5*  --   PHOS  --  4.6  --    Liver Function Tests:  Recent Labs Lab 08/21/17 1846 08/22/17 0358  AST 27 24  ALT 24 21  ALKPHOS 50 45  BILITOT 0.6 0.6  PROT 7.9 7.5  ALBUMIN 3.5 2.9*   No results for input(s): LIPASE, AMYLASE in the last 168 hours.  Recent Labs Lab 08/22/17 0358  AMMONIA 9   CBC:  Recent Labs Lab 08/21/17 1846 08/22/17 0358 08/23/17 0827  WBC 16.2* 12.6* 12.6*  HGB 9.3* 9.6* 9.4*  HCT 28.6* 30.3* 28.7*  MCV 93.2 93.5 92.3  PLT 339 442* 411*   Cardiac Enzymes: No results for input(s): CKTOTAL, CKMB, CKMBINDEX, TROPONINI in the last 168 hours. BNP: Invalid input(s): POCBNP CBG:  Recent Labs Lab 08/24/17 1705 08/24/17 1958 08/25/17 0000 08/25/17 0408 08/25/17 0734  GLUCAP 110* 210* 134* 124* 113*   D-Dimer No results for input(s): DDIMER in the last 72 hours. Hgb A1c No results for input(s): HGBA1C in the last 72 hours. Lipid Profile No results for input(s): CHOL, HDL, LDLCALC, TRIG, CHOLHDL, LDLDIRECT in the last 72 hours. Thyroid function studies No results for input(s): TSH, T4TOTAL, T3FREE, THYROIDAB in the last 72 hours.  Invalid input(s): FREET3 Anemia work up No results for input(s): VITAMINB12, FOLATE, FERRITIN, TIBC, IRON, RETICCTPCT in the last 72 hours. Urinalysis  Component Value Date/Time   COLORURINE YELLOW 08/21/2017 1915   APPEARANCEUR CLEAR 08/21/2017 1915   LABSPEC 1.005 08/21/2017 1915   LABSPEC 1.010 02/12/2009 1035   PHURINE 5.5 08/21/2017 1915   GLUCOSEU NEGATIVE 08/21/2017 1915    HGBUR NEGATIVE 08/21/2017 1915   BILIRUBINUR NEGATIVE 08/21/2017 1915   BILIRUBINUR Negative 02/12/2009 [place] 08/21/2017 1915   PROTEINUR NEGATIVE 08/21/2017 1915   UROBILINOGEN 0.2 08/14/2015 1400   NITRITE NEGATIVE 08/21/2017 1915   LEUKOCYTESUR SMALL (A) 08/21/2017 1915   LEUKOCYTESUR Large 02/12/2009 1035   Sepsis Labs Invalid input(s): PROCALCITONIN,  WBC,  LACTICIDVEN   Time coordinating discharge: Over 30 minutes  SIGNED:   Janece Canterbury, MD  [facility] 08/25/2017, 12:10 PM Pager   If 7PM-7AM, please contact night-coverage [email or web address] Password [email or web address]

## 2017-09-08 ENCOUNTER — Ambulatory Visit (INDEPENDENT_AMBULATORY_CARE_PROVIDER_SITE_OTHER): Payer: Medicare Other | Admitting: Neurology

## 2017-09-08 ENCOUNTER — Encounter: Payer: Self-pay | Admitting: Neurology

## 2017-09-08 VITALS — BP 117/63 | HR 66 | Ht 66.0 in

## 2017-09-08 DIAGNOSIS — I6523 Occlusion and stenosis of bilateral carotid arteries: Secondary | ICD-10-CM

## 2017-09-08 DIAGNOSIS — G6181 Chronic inflammatory demyelinating polyneuritis: Secondary | ICD-10-CM | POA: Diagnosis not present

## 2017-09-08 NOTE — Progress Notes (Signed)
[facility]   Provider: Dr Jaynee Eagles Referring Provider: [name], MD Primary Care Physician: Marylene Land, MD  CC: Leg pain and weakness  Interval History 09/09/2017: Patient reports that her neuropathy was significantly better on the IVIG, she felt like she was getting stronger as well and felt much better. Unfortunately, She had urinary obstruction and swelling and had to stop the IVIG but prior to that the IVIG was helping. The neuropathy was better. She is stronger in the legs. She is seeing urology and she is also being evaluated and cardiology for possible congestive heart failure. IVIG has been placed on hold. Do not want to start steroids due to patient's diabetes. Would really very much like to continue IVIG, we could try to give her the least fluid possible and also spread it out over several days. Patient's improving with a IVIG, confirms CIDP diagnosis.  Interval history 05/05/2017: This is a patient with progressive distal weakness and sensory changes. MRI of the lumbar spine shows severe narrowing of the thecal sac at L4-L5 with severe foraminal narrowing at multiple levels. However this does not appear unchanged as compared to April 2017 lumbar myelography and progressive weakness started later in 2017 and continues to worsen. Lumbar puncture revealed a total protein of 148 which is significantly elevated with unremarkable other CSF values. This can be seen in chronic inflammatory demyelinating polyneuropathy but can also be as a result of her severe spinal stenosis. Discussed with patient and husband that is very unclear. 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 Patient is considering having extensive lumbar spinal surgery I recommend possibly trying a course of IVIG to see if there is any improvement. We'll discuss with Dr. Sandi Mariscal. I had a very long conversation with  patient and her husband discussing potential causes of her symptoms.  JQZ:[id number] E Sandersis a 66 y.o.femalehere as a referral from Dr. Kandyce Rud chronic leg pain and weakness. She has a past medical history of chronic low back pain and lumbar radicular symptoms status post lumbar surgery with a fusion in 2014, peripheral neuropathy, history of chemotherapy in 2009, insulin-dependent diabetes, hypertension,COPD,anemia, bilateral carotid stenosis since 2011 and a right carotid endarterectomy in 2016, endometrial cancer in 2009 and had a recurrence in 2011 which was treated with radiation, hyperlipidemia, depression, anxiety, insomnia, hypertension, venous insufficiency She has chronic pain her feet started swelling last year. The pain increased with swelling. The pain is in her feet and toes. Worse in the right foot But the left is severe as well. She has burning, numbness and tingling in the feet. Swelling worsens the pain. She has been eating a lot of salt. The pain in the feet is what is significantly bothering her now and progressing. The back pain and radicular symptoms are stable bu the foot pain has gotten more severe. She has horrible cramping in the feet she wakes husband up yelling but none recently. Her feet "slump" she has weak ankles. Perioheral neuropathy happened after chemotherapy. Slowly progressive for many years worse in the last year. She can feel the pain up to her knees. It is very numb. She has foot drop both feet. The foot drop is not associated with the back. In fact pain in the back has improved. She drops things a lot but no similar sensory changes in the hands.At one point her HgbA1c was 9 but recently much better. Foot drop in the last 6 months.  Reviewed notes, labs and imaging from outside physicians, which showed: Patient has chronic leg pain and weakness. Over the last few years she has had lumbar radicular symptoms and had lumbar surgery with a fusion in 2014.  This subsequently became infected with MRSA and she required removal of hardware in 2015. In November she had an nerve conduction and EMG which was consistent with a peripheral neuropathy in the legs with a possible element of mild L5 radiculopathy. She has a number of complex medical problems and is actually followed up a treatment center in [place]. She is being weaned off of Nucynta and OxyContin. Her peripheral neuropathy may very well be from chemotherapy that she had in 2009 possibly exacerbated by her diabetes. I did check a B12 and methylmalonic acid on her several years ago when repeated in December and those were normal. Her biggest problem recently has been that she has had progressive leg weakness to the point that she is having difficulty walking at all. She is currently on gabapentin. 2 years ago switched her to Lyrica. She thought it was more effective for the burning paresthesias that she has a more severe dull aching leg pain that she experiences have done better on the gabapentin. She is also currently on nortriptyline which is probably giving her some pain relief although she is taking that primarily to help her sleep.  CRP 10.2, TSH 2.4, uric acid normal, CMP showed creatinine 1.15 and BUN 16, hemoglobin A1c 5.8, CBC with anemia 9.9/32.5 these labs were drawn in September 2017. Labs drawn in December 2017 showed a vitamin B12 of greater than 2000 with normal methylmalonic acid, hemoglobin 11 and hematocrit 36.1 with normal MCV. Labs drawn in February 2018 showed a CMP with BUN 16 and creatinine 0.85 otherwise unremarkable and hemoglobin A1c was 6.low ferritin 18 back in September 2017.  NERVE CONDUCTION STUDIES:  Nerve conduction studies were done on the left upper extremity. The distal motor latency for the left median nerve was prolonged with a normal motor amplitude. The F wave latency and nerve conduction velocity for this nerve was normal. The left median sensory latency was  prolonged.  Nerve conduction studies were performed on both lower extremities. The studies of the peroneal nerves were unobtainable bilaterally. The distal motor latencies for the posterior tibial nerves were prolonged bilaterally with low motor amplitudes seen for these nerves bilaterally. The nerve conduction velocities for the posterior tibial nerves were normal bilaterally. The H reflex latencies were unobtainable bilaterally. The sural and peroneal sensory latencies were unobtainable bilaterally.  EMG STUDIES:Personally reviewed EMG and nerve conduction study values and data and agree with the following  EMG study was performed on the leftlower extremity:  The tibialis anterior muscle reveals no voluntarymotor units with norecruitment. 3+fibrillations andpositive waves were seen. The peroneus tertius muscle reveals no voluntarymotor units with no recruitment. 3+fibrillations andpositive waves were seen. The medial gastrocnemius muscle reveals 1 to 3K motor units with markedly decreasedrecruitment.3+fibrillations andpositive waves were seen. The vastus lateralis muscle reveals 2 to 4K motor units with slightly decreasedrecruitment. No fibrillations or positive waves were seen. The iliopsoas muscle reveals 2 to 4K motor units with full recruitment. No fibrillations or positive waves were seen. The biceps femoris muscle (long head) reveals 2 to 3K motor units with slightly decreasedrecruitment.1+fibrillations andpositive waves were seen. The lumbosacral paraspinal muscles were tested at 3 levels, and revealed no abnormalities of insertional activity at all 3 levels tested. There was good relaxation.  EMG study was performed on  the rightlower extremity:  The tibialis anterior muscle reveals 1 to 2K motor units with markedly decreasedrecruitment. 3+fibrillations andpositive waves were seen. The peroneus tertius muscle reveals 1 to 2K motor units with markedly  decreasedrecruitment. 3+fibrillations andpositive waves were seen. The medial gastrocnemius muscle reveals 1 to 3K motor units with decreasedrecruitment. 2+fibrillations andpositive waves were seen. The vastus lateralis muscle reveals 2 to 5K motor units with decreasedrecruitment. No fibrillations or positive waves were seen. The iliopsoas muscle reveals 2 to 4K motor units with full recruitment. No fibrillations or positive waves were seen. The biceps femoris muscle (long head) reveals 2 to 3K motor units with slightly decreasedrecruitment. No fibrillations or positive waves were seen. The lumbosacral paraspinal muscles were not tested, the patient was unable to roll on her left side.   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 There is an overlying mild left carpal tunnel syndrome. EMG evaluation of the left lower extremity shows distal acute and chronic signs of denervation consistent with the diagnosis of peripheral neuropathy, there may be an overlying mild acute L5 radiculopathy. EMG evaluation of the right lower extremity shows distal acute and chronic signs of denervation consistent with a severe peripheral neuropathy. An overlying lumbosacral radiculopathy on this side cannot be confirmed.   Review of Systems: Patient complains of symptoms per HPI as well as the following symptoms: Fatigue, blurred vision, double vision, anemia, swelling in legs, increased thirst, incontinence, diarrhea, cramps, aching muscles, slurred speech, dizziness, insomnia, depression, not enough sleep, decreased energy. Pertinent negatives per HPI. All others negative.     Social History   Social History  . Marital status: Married    Spouse name: N/A  . Number of children: 1  . Years of education: College   Occupational History  . disabled    Social  History Main Topics  . Smoking status: Former Smoker    Packs/day: 0.10    Years: 40.00    Types: Cigarettes    Quit date: 01/2017  . Smokeless tobacco: Never Used  . Alcohol use No  . Drug use: No  . Sexual activity: No   Other Topics Concern  . Not on file   Social History Narrative   Married, 1 daughter   Disabled   Right-hand   Caffeine: caffeine free sodas    Family History  Problem Relation Age of Onset  . Stroke Mother   . Irritable bowel syndrome Mother   . Heart attack Father 25       died  . Heart disease Father        Heart Disease before age 65, rheumatic heart disease  . Hyperlipidemia Father   . Hypertension Father   . Diabetes Daughter   . Hypertension Daughter   . Irritable bowel syndrome Daughter   . Colon cancer Neg Hx   . Esophageal cancer Neg Hx   . Pancreatic cancer Neg Hx   . Rectal cancer Neg Hx   . Stomach cancer Neg Hx   . Neuropathy Neg Hx     Past Medical History:  Diagnosis Date  . Adenomatous colon polyp   . Anemia   . Anxiety   . AVM (arteriovenous malformation)    cecum  . CAD (coronary artery disease)   . Carotid artery occlusion   . Cataract    bil cateracts removed  . COPD (chronic obstructive pulmonary disease) ([place])   .  Cough   . DDD (degenerative disc disease), lumbar   . Depression   . Diabetes mellitus   . Elevated LFTs   . Endometrial ca [facility])    endometrial ca dx 4/11  . GERD (gastroesophageal reflux disease)   . Gout 2015  . Hernia of flank   . Hydronephrosis   . Hyperlipidemia   . Hypertension   . Insomnia   . OAB (overactive bladder)   . Osteoarthritis   . Peripheral neuropathy    following chemotherapy  . Psoriasis   . Pulmonary nodule    since 2016  . Radiation 05/01/2011-06/11/11   5600 cGy 28 fxs/periaortic  . Stroke ([place])    tia  . Thyroid nodule   . TIA (transient ischemic attack) 2015  . 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 Uterine cancer ([place])   . Ventral  hernia    postoperative since 2009    Past Surgical History:  Procedure Laterality Date  . ABDOMINAL HYSTERECTOMY  2009   for ca  . BACK SURGERY  Aug/sept 2014   3 back surgeries  (Aug 2014 & Sept 2014)  . CHOLECYSTECTOMY     biliteral  . COLONOSCOPY  11/13/2014   negative except for a small cecal AVM and 1 polyp  . ENDARTERECTOMY Right 08/17/2015   Procedure: Right Carotid Endartarectomy with patch Angioplasty ;  Surgeon: Mal Misty, MD;  Location: [place];  Service: Vascular;  Laterality: Right;  . EYE SURGERY Right Aug. 2013   Cataract, lens implant  . EYE SURGERY Left Sept. 2013   Cataract, lens implant  . I & D of lumbar fusion /removal of hardware for chronic infection  Jan. 8, 2015  . right carotid endarterectomy  2016  . SPINE SURGERY      Current Outpatient Prescriptions  Medication Sig Dispense Refill  . Albuterol Sulfate (PROAIR RESPICLICK) 616 (90 Base) MCG/ACT AEPB Inhale 2 puffs into the lungs 4 (four) times daily as needed (congestion, cough, wheezing).    Marland Kitchen amLODipine (NORVASC) 5 MG tablet Take 1 tablet (5 mg total) by mouth daily. 30 tablet 0  . aspirin EC 81 MG tablet Take 81 mg by mouth daily.    . betamethasone dipropionate (DIPROLENE) 0.05 % ointment Apply 1 application topically 2 (two) times daily as needed (psoriasis).     . Cholecalciferol (VITAMIN D3) 1000 UNITS CAPS Take 1,000 Units by mouth daily.     Marland Kitchen escitalopram (LEXAPRO) 20 MG tablet Take 20 mg by mouth daily.    Marland Kitchen esomeprazole (NEXIUM) 40 MG capsule Take 40 mg by mouth daily.     . ferrous sulfate 325 (65 FE) MG tablet Take 325 mg by mouth daily.     . Fluticasone-Salmeterol (ADVAIR) 250-50 MCG/DOSE AEPB Inhale 1 puff into the lungs 2 (two) times daily as needed (shortness of breath).     . folic acid (FOLVITE) 1 MG tablet Take 1 mg by mouth daily.    Marland Kitchen gabapentin (NEURONTIN) 600 MG tablet Take 600 mg by mouth 6 (six) times daily. 6am, 10am, 4pm, 6pm, 10pm, midnight    . glucose blood  (ACCU-CHEK AVIVA PLUS) test strip 1 each by Other route as needed. Use as instructed     . insulin glargine (LANTUS) 100 UNIT/ML injection Inject 0.3 mLs (30 Units total) into the skin at bedtime. 10 mL 11  . magnesium oxide (MAG-OX) 400 MG tablet Take 400 mg by mouth daily.    Marland Kitchen  Melatonin 10 MG TABS Take 10 mg by mouth at bedtime.    . metaxalone (SKELAXIN) 800 MG tablet Take 800 mg by mouth 3 (three) times daily. 6am, noon, 6pm    . metoprolol succinate (TOPROL-XL) 50 MG 24 hr tablet Take 50 mg by mouth daily.    . Multiple Vitamin (MULTIVITAMIN WITH MINERALS) TABS tablet Take 1 tablet by mouth daily.    . nitroGLYCERIN (NITROSTAT) 0.4 MG SL tablet Place 1 tablet (0.4 mg total) under the tongue every 5 (five) minutes as needed for chest pain. 25 tablet 3  . nortriptyline (PAMELOR) 10 MG capsule Take 10 mg by mouth at bedtime.     . ondansetron (ZOFRAN-ODT) 4 MG disintegrating tablet Take 4 mg by mouth every 8 (eight) hours as needed for nausea or vomiting.     . OXYCONTIN 15 MG 12 hr tablet Take 1 tablet (15 mg total) by mouth every 12 (twelve) hours. 4 tablet 0  . polyethylene glycol (MIRALAX / GLYCOLAX) packet Take 17 g by mouth 2 (two) times daily. 100 each 0  . predniSONE (DELTASONE) 20 MG tablet Take 2 tablets (40 mg total) by mouth daily with breakfast. 8 tablet 0  . rosuvastatin (CRESTOR) 5 MG tablet Take 5 mg by mouth at bedtime.    . senna (SENOKOT) 8.6 MG TABS tablet Take 2 tablets (17.2 mg total) by mouth at bedtime. 120 each 0  . tapentadol (NUCYNTA) 50 MG tablet Take 1 tablet (50 mg total) by mouth every 6 (six) hours. 6 am, noon, 6pm  And midnight 8 tablet 0  . vitamin B-12 (CYANOCOBALAMIN) 1000 MCG tablet Take 1,000 mcg by mouth daily.     No current facility-administered medications for this visit.     Allergies as of 09/08/2017 - Review Complete 08/22/2017  Allergen Reaction Noted  . Ambien [zolpidem tartrate] Other (See Comments) 11/06/2014  . Sulfa antibiotics Rash  03/13/2011  . Elavil [amitriptyline hcl] Other (See Comments) 10/31/2014  . Prozac [fluoxetine hcl] Anxiety and Other (See Comments) 11/08/2014    Vitals: There were no vitals taken for this visit. Last Weight:  Wt Readings from Last 1 Encounters:  08/22/17 197 lb 12 oz (89.7 kg)   Last Height:   Ht Readings from Last 1 Encounters:  08/22/17 5\' 6"  (1.676 m)       Physical exam: Exam: Gen: NAD, conversant, well nourised, obese, well groomed  CV: RRR, no MRG. No Carotid Bruits. No peripheral edema, warm, nontender Eyes: Conjunctivae clear without exudates or hemorrhage  Neuro: Detailed Neurologic Exam  Speech: Speech is normal; fluent and spontaneous with normal comprehension.  Cognition: The patient is oriented to person, place, and time;  recent and remote memory intact;  language fluent;  normal attention, concentration,  fund of knowledge Cranial Nerves: The pupils are equal, round, and reactive to light. Attempted fundoscopic exam could not visualize. . Visual fields are full to finger confrontation. Extraocular movements are intact. Trigeminal sensation is intact and the muscles of mastication are normal. The face is symmetric. The palate elevates in the midline. Hearing intact. Voice is normal. Shoulder shrug is normal. The tongue has normal motion without fasciculations.   Coordination: Normal finger to nose, cannot perform HTS due to weakness  Gait: Cannot bear weight independently, few steps wide based but cannot fully assess gait due to weakness.  Motor Observation: Mild distal atrophy most pronounced in FDI of hand and inrinsic foot muscles and no involuntary movements noted. Tone: increased LE tone vs paratonia  vs pain  Posture: Posture is normal in wheelchair  Strength: Left deltoid: injury, Weak grip hands, Otherwise 5 to 5- uppers hip flexion 3+-4/5, leg extension flexion 4+/5, DF 0  bilat Much improved  Sensation:   Decreased pp to elbows and mid calf Absent vibration great toes and patella priprioception mostly intact possibly mild decreased  Reflex Exam:  DTR's: Brisk biceps Absent lowers  Toes: The toes are equivocal bilaterally.  Clonus: Clonus is absent.      Assessment/Plan:Very nice 66 year old female here with her husband for evaluation of a progressive length-dependent, severe, axonal, sensorimotor neuropathy. Axonal sensorimotor polyneuropathies carry an extensive list of differential diagnoses. Causes may be metabolic, toxic, nutritional, iatrogenic, infectious, immune mediated, vascular, neoplastic, paraneoplastic and genetic. Diabetes mellitus and prediabetes are the most common causes of neuropathy. Patient does have a history of uncontrolled diabetes. Also patient reports her diabetes last year was uncontrolled and has only recently come down to normal hgba1c which raises the question of treatment induced neuropathy of diabetes where neuropathy may occur within weeks of rapid glycemic control. Patient also has a history of neurotoxic medications and chemotherapy. No alcohol overuse. MRI showed severe lumbar stenosis which is definitely contributory.   - Patient's neuropathy and strength have improved since starting IVIG, confirming CIDP diagnosis.  Unfortunately, She had urinary obstruction and swelling and had to stop the IVIG.  She is seeing urology and she is also being evaluated at cardiology for possible congestive heart failure. IVIG has been placed on hold. Do not want to start steroids due to patient's diabetes. Would really very much like to continue IVIG, we could try to give her the least fluid possible and also spread it out over several days. Will touch base with cardiology, urology and primary care.  -May consider daily alpha lipoic acid which is an antioxidant that may reduce free radical oxidative stress  associated with diabetic polyneuropathy, existing evidence suggests that alpha lipoic acid significantly reduces stabbing, lancinating and burning pain and diabetic neuropathy with its onset of action as early as 1-2 weeks.  - Follows with [facility] consult for severe L4/L5 spinal stenosis (L4-5: There is severe narrowing of the thecal sac at this level. The patient is status post posterior decompression. Severe bilateralforaminal narrowing is present. The appearance is unchanged compared to the prior CT myelogram.)    Sarina Ill, MD  [facility] [address] [place], [place] [address]  Phone [phone] Fax [phone]  A total of 35 minutes was spent face-to-face with this patient. Over half this time was spent on counseling patient on the CIDP diagnosis and different diagnostic and therapeutic options available.

## 2017-09-09 DIAGNOSIS — G6181 Chronic inflammatory demyelinating polyneuritis: Secondary | ICD-10-CM | POA: Insufficient documentation

## 2017-09-10 NOTE — Progress Notes (Signed)
Pt seen in our office 09-09-17 with Dr. Jaynee Eagles.

## 2017-09-11 ENCOUNTER — Emergency Department (HOSPITAL_COMMUNITY): Payer: Medicare Other

## 2017-09-11 ENCOUNTER — Emergency Department (HOSPITAL_COMMUNITY)
Admission: EM | Admit: 2017-09-11 | Discharge: 2017-09-12 | Disposition: A | Discharge status: Home / Self Care | Payer: Medicare Other | Attending: Emergency Medicine | Admitting: Emergency Medicine

## 2017-09-11 ENCOUNTER — Encounter (HOSPITAL_COMMUNITY): Payer: Self-pay | Admitting: Nurse Practitioner

## 2017-09-11 ENCOUNTER — Ambulatory Visit: Payer: Medicare Other | Admitting: Vascular Surgery

## 2017-09-11 ENCOUNTER — Telehealth: Payer: Self-pay | Admitting: Cardiovascular Disease

## 2017-09-11 DIAGNOSIS — J449 Chronic obstructive pulmonary disease, unspecified: Secondary | ICD-10-CM | POA: Diagnosis not present

## 2017-09-11 DIAGNOSIS — R739 Hyperglycemia, unspecified: Secondary | ICD-10-CM

## 2017-09-11 DIAGNOSIS — C541 Malignant neoplasm of endometrium: Secondary | ICD-10-CM | POA: Diagnosis not present

## 2017-09-11 DIAGNOSIS — I509 Heart failure, unspecified: Secondary | ICD-10-CM | POA: Diagnosis not present

## 2017-09-11 DIAGNOSIS — Z79899 Other long term (current) drug therapy: Secondary | ICD-10-CM | POA: Insufficient documentation

## 2017-09-11 DIAGNOSIS — I2510 Atherosclerotic heart disease of native coronary artery without angina pectoris: Secondary | ICD-10-CM | POA: Insufficient documentation

## 2017-09-11 DIAGNOSIS — C55 Malignant neoplasm of uterus, part unspecified: Secondary | ICD-10-CM | POA: Diagnosis not present

## 2017-09-11 DIAGNOSIS — Z7982 Long term (current) use of aspirin: Secondary | ICD-10-CM | POA: Diagnosis not present

## 2017-09-11 DIAGNOSIS — R531 Weakness: Secondary | ICD-10-CM | POA: Diagnosis present

## 2017-09-11 DIAGNOSIS — Z794 Long term (current) use of insulin: Secondary | ICD-10-CM | POA: Insufficient documentation

## 2017-09-11 DIAGNOSIS — E1165 Type 2 diabetes mellitus with hyperglycemia: Secondary | ICD-10-CM | POA: Insufficient documentation

## 2017-09-11 DIAGNOSIS — Z87891 Personal history of nicotine dependence: Secondary | ICD-10-CM | POA: Diagnosis not present

## 2017-09-11 DIAGNOSIS — I110 Hypertensive heart disease with heart failure: Secondary | ICD-10-CM | POA: Insufficient documentation

## 2017-09-11 DIAGNOSIS — E860 Dehydration: Secondary | ICD-10-CM | POA: Diagnosis not present

## 2017-09-11 LAB — CBC WITH DIFFERENTIAL/PLATELET
Basophils Absolute: 0 10*3/uL (ref 0.0–0.1)
Basophils Relative: 0 %
EOS ABS: 0.2 10*3/uL (ref 0.0–0.7)
Eosinophils Relative: 2 %
HEMATOCRIT: 31.2 % — AB (ref 36.0–46.0)
HEMOGLOBIN: 10.1 g/dL — AB (ref 12.0–15.0)
LYMPHS ABS: 1.1 10*3/uL (ref 0.7–4.0)
Lymphocytes Relative: 8 %
MCH: 28.9 pg (ref 26.0–34.0)
MCHC: 32.4 g/dL (ref 30.0–36.0)
MCV: 89.4 fL (ref 78.0–100.0)
MONO ABS: 1.5 10*3/uL — AB (ref 0.1–1.0)
MONOS PCT: 11 %
NEUTROS ABS: 10.7 10*3/uL — AB (ref 1.7–7.7)
NEUTROS PCT: 79 %
Platelets: 398 10*3/uL (ref 150–400)
RBC: 3.49 MIL/uL — ABNORMAL LOW (ref 3.87–5.11)
RDW: 16.2 % — ABNORMAL HIGH (ref 11.5–15.5)
WBC: 13.5 10*3/uL — ABNORMAL HIGH (ref 4.0–10.5)

## 2017-09-11 LAB — I-STAT CHEM 8, ED
BUN: 17 mg/dL (ref 6–20)
CALCIUM ION: 1.11 mmol/L — AB (ref 1.15–1.40)
Chloride: 94 mmol/L — ABNORMAL LOW (ref 101–111)
Creatinine, Ser: 1.1 mg/dL — ABNORMAL HIGH (ref 0.44–1.00)
GLUCOSE: 392 mg/dL — AB (ref 65–99)
HCT: 28 % — ABNORMAL LOW (ref 36.0–46.0)
Hemoglobin: 9.5 g/dL — ABNORMAL LOW (ref 12.0–15.0)
Potassium: 3.8 mmol/L (ref 3.5–5.1)
Sodium: 133 mmol/L — ABNORMAL LOW (ref 135–145)
TCO2: 29 mmol/L (ref 22–32)

## 2017-09-11 LAB — URINALYSIS, ROUTINE W REFLEX MICROSCOPIC
Bilirubin Urine: NEGATIVE
GLUCOSE, UA: NEGATIVE mg/dL
HGB URINE DIPSTICK: NEGATIVE
Ketones, ur: NEGATIVE mg/dL
Leukocytes, UA: NEGATIVE
Nitrite: NEGATIVE
PH: 6 (ref 5.0–8.0)
Protein, ur: NEGATIVE mg/dL
SPECIFIC GRAVITY, URINE: 1 — AB (ref 1.005–1.030)

## 2017-09-11 LAB — BASIC METABOLIC PANEL
Anion gap: 10 (ref 5–15)
BUN: 17 mg/dL (ref 6–20)
CHLORIDE: 92 mmol/L — AB (ref 101–111)
CO2: 28 mmol/L (ref 22–32)
CREATININE: 1.19 mg/dL — AB (ref 0.44–1.00)
Calcium: 8.8 mg/dL — ABNORMAL LOW (ref 8.9–10.3)
GFR calc Af Amer: 54 mL/min — ABNORMAL LOW (ref >60)
GFR calc non Af Amer: 46 mL/min — ABNORMAL LOW (ref >60)
Glucose, Bld: 367 mg/dL — ABNORMAL HIGH (ref 65–99)
Potassium: 3.9 mmol/L (ref 3.5–5.1)
Sodium: 130 mmol/L — ABNORMAL LOW (ref 135–145)

## 2017-09-11 LAB — MAGNESIUM: Magnesium: 1.5 mg/dL — ABNORMAL LOW (ref 1.7–2.4)

## 2017-09-11 MED ORDER — MAGNESIUM SULFATE 2 GM/50ML IV SOLN
2.0000 g | Freq: Once | INTRAVENOUS | Status: AC
  Administered 2017-09-11: 2 g via INTRAVENOUS
  Filled 2017-09-11: qty 50

## 2017-09-11 MED ORDER — SODIUM CHLORIDE 0.9 % IV BOLUS (SEPSIS)
1000.0000 mL | Freq: Once | INTRAVENOUS | Status: AC
  Administered 2017-09-11: 1000 mL via INTRAVENOUS

## 2017-09-11 MED ORDER — INSULIN GLARGINE 100 UNIT/ML ~~LOC~~ SOLN
30.0000 [IU] | Freq: Once | SUBCUTANEOUS | Status: AC
  Administered 2017-09-11: 30 [IU] via SUBCUTANEOUS
  Filled 2017-09-11: qty 0.3

## 2017-09-11 NOTE — Discharge Instructions (Signed)
I suspect your low sodium was due to a combination of dehydration as well as high blood sugars.  Keep close track of your sugars and notify your doctor regarding your diabetes regimen.  Drink plenty of fluids for the next 24-48 hours.

## 2017-09-11 NOTE — Telephone Encounter (Signed)
New Message   pt husband verbalized that he is calling for rn

## 2017-09-11 NOTE — ED Triage Notes (Signed)
Pt is presented by medics from [facility] for evaluation for hyponatremia (126). Paper work indicates labs were drawn and resulted today 09/11/2017. Pt is c/o generalized pain 7/10. She is awake and alert, neurological baseline unknown at this time. Recently hospitalized for seemingly similar symptoms.

## 2017-09-11 NOTE — Telephone Encounter (Signed)
Spoke to husband- he called. He states he is concerned that his wife has not had potassium on daily bases since leaving hospital. Patient was hospitalized in 08/22/17  And went to [facility] rehab. Husband states patient is receiving a large dose of Furosemide but not her potassium restarted. He is concerned would like Dr Sallyanne Kuster to call an restart medications. Husband state patient  Attending is a Dr [name] Norrins.  RN informed husband - that medical changes would need to come from patient attending doctor at [facility]. Due to the fact patient has not been seen since hospital or rehab and no labs available.Recommend husband contact [facility] and discuss with phyisican or extender. Also offered appointment for Monday 09/14/17 with Dr Sallyanne Kuster. Patient verbalized understanding and agreed.

## 2017-09-11 NOTE — ED Provider Notes (Signed)
[facility] DEPT Provider Note   CSN: [id number] Arrival date & time: 09/11/17  1900     History   Chief Complaint Chief Complaint  Patient presents with  . Abnormal Labs    Hyponatremia (126)    HPI Lauren [name] is a 66 y.o. female.  HPI   66 yo F with PMHx as below including HTN, HLD, CAD, endometrial CA, recent admission for urinary retention with indwelling foley in place here for evaluation f hyponatremia. Pt is without complaints. She states that she had labs checked at her SNF and was noted to have hyponatremia, so she was sent to the ER. She has had some mild weakness, but o/w denies any headache, tremors, vision changes. No confusion. She has not changed or missed any medications recently. She continues to have a foley in place and denies any new pain or flank pain.  Past Medical History:  Diagnosis Date  . Adenomatous colon polyp   . Anemia   . Anxiety   . AVM (arteriovenous malformation)    cecum  . CAD (coronary artery disease)   . Carotid artery occlusion   . Cataract    bil cateracts removed  . COPD (chronic obstructive pulmonary disease) ([place])   . Cough   . DDD (degenerative disc disease), lumbar   . Depression   . Diabetes mellitus   . Elevated LFTs   . Endometrial ca [facility])    endometrial ca dx 4/11  . GERD (gastroesophageal reflux disease)   . Gout 2015  . Hernia of flank   . Hydronephrosis   . Hyperlipidemia   . Hypertension   . Insomnia   . OAB (overactive bladder)   . Osteoarthritis   . Peripheral neuropathy    following chemotherapy  . Psoriasis   . Pulmonary nodule    since 2016  . Radiation 05/01/2011-06/11/11   5600 cGy 28 fxs/periaortic  . Stroke ([place])    tia  . Thyroid nodule   . TIA (transient ischemic attack) 2015  . 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 Uterine cancer ([place])   . Ventral hernia    postoperative since 2009    Patient Active Problem List   Diagnosis Date Noted  . 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 Acute encephalopathy 08/22/2017  . Acute urinary retention 08/22/2017  . AKI (acute kidney injury) ([place]) 08/22/2017  . Diabetes mellitus due to underlying condition with diabetic peripheral angiopathy without gangrene ([place]) 08/15/2017  . PAD (peripheral artery disease) ([place]) 08/15/2017  . Cor pulmonale, chronic ([place]) 08/15/2017  . Acute on chronic diastolic heart failure ([place]) 08/15/2017  . History of cigarette smoking 08/15/2017  . Multiple pulmonary nodules determined by computed tomography of lung 07/31/2017  . 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 Pressure ulcer of left heel, stage 2 02/04/2017  . Altered mental status   . Community acquired pneumonia   . Encephalopathy   . Fall   . Weakness of both lower extremities   . Other chronic pain   . Narcotic dependence ([place])   . Incontinence of feces   . Sepsis ([place]) 02/02/2017  . Carotid stenosis, bilateral 01/30/2017  . Aftercare following surgery of the circulatory system 01/30/2017  . Impingement syndrome of left shoulder 12/17/2016  . Coronary artery calcification of native artery 10/07/2016  . Iron (Fe) deficiency anemia 10/07/2016  . DDD (degenerative disc disease), lumbar 10/03/2016  . Fatigue 10/03/2016  . Intervertebral disc disease  10/03/2016  . Low back pain 10/03/2016  . Myelopathy ([place]) 10/03/2016  . Prolapsed lumbosacral intervertebral disc 10/03/2016  . Spondylisthesis 10/03/2016  . MRSA (methicillin resistant staph aureus) culture positive 10/10/2015  . Carotid stenosis 08/17/2015  . Preoperative cardiovascular examination 08/10/2015  . Tobacco abuse 08/10/2015  . 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 Acute osteomyelitis of spine ([place]) 08/28/2014  . Difficult or painful urination 08/28/2014  . H/O Spinal surgery 08/28/2014  . Elevated WBC count 08/28/2014  . Encounter for aftercare for long-term (current) use of antibiotics 08/28/2014    . N&V (nausea and vomiting) 08/28/2014  . Bladder retention 08/28/2014  . Infection of urinary tract 08/28/2014  . DM2 (diabetes mellitus, type 2) ([place]) 06/15/2013  . Ventral hernia 06/15/2013  . COPD GOLD III/IV 06/15/2013  . DJD (degenerative joint disease) of lumbar spine 06/15/2013  . Depression 06/15/2013  . Exertional angina ([place]) 06/15/2013  . Preoperative evaluation 06/15/2013  . HTN (hypertension) 06/15/2013  . Occlusion and stenosis of carotid artery without mention of cerebral infarction 03/02/2012  . Malignant neoplasm of corpus uteri, except isthmus ([place]) 01/19/2012  . Abnormal EKG 12/07/2011  . Essential hypertension, benign 12/07/2011  . Mixed hyperlipidemia 12/07/2011  . Carotid artery disease without cerebral infarction ([place]) 12/07/2011    Past Surgical History:  Procedure Laterality Date  . ABDOMINAL HYSTERECTOMY  2009   for ca  . BACK SURGERY  Aug/sept 2014   3 back surgeries  (Aug 2014 & Sept 2014)  . CHOLECYSTECTOMY     biliteral  . COLONOSCOPY  11/13/2014   negative except for a small cecal AVM and 1 polyp  . ENDARTERECTOMY Right 08/17/2015   Procedure: Right Carotid Endartarectomy with patch Angioplasty ;  Surgeon: Mal Misty, MD;  Location: [facility];  Service: Vascular;  Laterality: Right;  . EYE SURGERY Right Aug. 2013   Cataract, lens implant  . EYE SURGERY Left Sept. 2013   Cataract, lens implant  . I & D of lumbar fusion /removal of hardware for chronic infection  Jan. 8, 2015  . right carotid endarterectomy  2016  . SPINE SURGERY      OB History    No data available       Home Medications    Prior to Admission medications   Medication Sig Start Date End Date Taking? Authorizing Provider  Albuterol Sulfate (PROAIR RESPICLICK) 062 (90 Base) MCG/ACT AEPB Inhale 2 puffs into the lungs 4 (four) times daily as needed (congestion, cough, wheezing).   Yes [provider]  amLODipine (NORVASC) 5 MG tablet Take 1 tablet (5 mg total) by  mouth daily. 08/26/17  Yes Janece Canterbury, MD  aspirin EC 81 MG tablet Take 81 mg by mouth daily.   Yes [provider]  Cholecalciferol (VITAMIN D3) 1000 UNITS CAPS Take 1,000 Units by mouth daily.    Yes [provider]  escitalopram (LEXAPRO) 20 MG tablet Take 20 mg by mouth daily.   Yes [provider]  esomeprazole (NEXIUM) 40 MG capsule Take 40 mg by mouth daily.    Yes [provider]  ferrous sulfate 325 (65 FE) MG tablet Take 325 mg by mouth daily.    Yes [provider]  Fluticasone-Salmeterol (ADVAIR) 250-50 MCG/DOSE AEPB Inhale 1 puff into the lungs 2 (two) times daily as needed (shortness of breath).    Yes [provider]  folic acid (FOLVITE) 1 MG tablet Take 1 mg by mouth daily.   Yes [provider]  furosemide (LASIX)  40 MG tablet Take 40 mg by mouth daily.  08/14/17  Yes [provider]  gabapentin (NEURONTIN) 600 MG tablet Take 600 mg by mouth 6 (six) times daily. 6am, 10am, 4pm, 6pm, 10pm, midnight   Yes [provider]  insulin glargine (LANTUS) 100 UNIT/ML injection Inject 0.3 mLs (30 Units total) into the skin at bedtime. 08/25/17  Yes Short, Noah Delaine, MD  losartan (COZAAR) 100 MG tablet Take 100 mg by mouth daily.  06/14/17  Yes [provider]  magnesium oxide (MAG-OX) 400 MG tablet Take 400 mg by mouth daily.   Yes [provider]  Melatonin 10 MG TABS Take 10 mg by mouth at bedtime.   Yes [provider]  metaxalone (SKELAXIN) 800 MG tablet Take 800 mg by mouth 3 (three) times daily. 6am, noon, 6pm   Yes [provider]  metoprolol succinate (TOPROL-XL) 50 MG 24 hr tablet Take 50 mg by mouth daily. 11/21/16  Yes [provider]  Multiple Vitamin (MULTIVITAMIN WITH MINERALS) TABS tablet Take 1 tablet by mouth daily.   Yes [provider]  nortriptyline (PAMELOR) 10 MG capsule Take 10 mg by mouth at bedtime.    Yes [provider]    ondansetron (ZOFRAN-ODT) 4 MG disintegrating tablet Take 4 mg by mouth every 8 (eight) hours as needed for nausea or vomiting.  09/16/16  Yes [provider]  OXYCONTIN 15 MG 12 hr tablet Take 1 tablet (15 mg total) by mouth every 12 (twelve) hours. 08/25/17  Yes Short, Noah Delaine, MD  polyethylene glycol (MIRALAX / GLYCOLAX) packet Take 17 g by mouth 2 (two) times daily. 08/25/17  Yes Short, Noah Delaine, MD  rosuvastatin (CRESTOR) 5 MG tablet Take 5 mg by mouth at bedtime. 01/13/17  Yes [provider]  tapentadol (NUCYNTA) 50 MG tablet Take 1 tablet (50 mg total) by mouth every 6 (six) hours. 6 am, noon, 6pm  And midnight 08/25/17  Yes Short, Noah Delaine, MD  vitamin B-12 (CYANOCOBALAMIN) 1000 MCG tablet Take 1,000 mcg by mouth daily.   Yes [provider]  glucose blood (ACCU-CHEK AVIVA PLUS) test strip 1 each by Other route as needed. Use as instructed     [provider]  nitroGLYCERIN (NITROSTAT) 0.4 MG SL tablet Place 1 tablet (0.4 mg total) under the tongue every 5 (five) minutes as needed for chest pain. 06/15/13   Croitoru, Mihai, MD  senna (SENOKOT) 8.6 MG TABS tablet Take 2 tablets (17.2 mg total) by mouth at bedtime. Patient not taking: Reported on 09/11/2017 08/25/17   Janece Canterbury, MD    Family History Family History  Problem Relation Age of Onset  . Stroke Mother   . Irritable bowel syndrome Mother   . Heart attack Father 75       died  . Heart disease Father        Heart Disease before age 60, rheumatic heart disease  . Hyperlipidemia Father   . Hypertension Father   . Diabetes Daughter   . Hypertension Daughter   . Irritable bowel syndrome Daughter   . Colon cancer Neg Hx   . Esophageal cancer Neg Hx   . Pancreatic cancer Neg Hx   . Rectal cancer Neg Hx   . Stomach cancer Neg Hx   . Neuropathy Neg Hx     Social History Social History  Substance Use Topics  . Smoking status: Former Smoker    Packs/day: 0.10    Years: 40.00     Types: Cigarettes  Quit date: 01/2017  . Smokeless tobacco: Never Used  . Alcohol use No     Allergies   Ambien [zolpidem tartrate]; Sulfa antibiotics; Elavil [amitriptyline hcl]; and Prozac [fluoxetine hcl]   Review of Systems Review of Systems  Constitutional: Positive for fatigue. Negative for chills and fever.  HENT: Negative for congestion and rhinorrhea.   Eyes: Negative for visual disturbance.  Respiratory: Negative for cough, shortness of breath and wheezing.   Cardiovascular: Negative for chest pain and leg swelling.  Gastrointestinal: Negative for abdominal pain, diarrhea, nausea and vomiting.  Genitourinary: Negative for dysuria and flank pain.  Musculoskeletal: Negative for neck pain and neck stiffness.  Skin: Negative for rash and wound.  Allergic/Immunologic: Negative for immunocompromised state.  Neurological: Negative for syncope, weakness and headaches.  All other systems reviewed and are negative.    Physical Exam Updated Vital Signs BP 140/82   Pulse 78   Temp 99 F (37.2 C) (Oral)   Resp 19   Ht 5\' 7"  (1.702 m)   Wt 83 kg (183 lb)   SpO2 94%   BMI 28.66 kg/m   Physical Exam  Constitutional: She is oriented to person, place, and time. She appears well-developed and well-nourished. No distress.  HENT:  Head: Normocephalic and atraumatic.  Eyes: Conjunctivae are normal.  Neck: Neck supple.  Cardiovascular: Normal rate, regular rhythm and normal heart sounds.  Exam reveals no friction rub.   No murmur heard. Pulmonary/Chest: Effort normal and breath sounds normal. No respiratory distress. She has no wheezes. She has no rales.  Abdominal: Soft. Bowel sounds are normal. She exhibits no distension. There is no tenderness. There is no guarding.  Large ventral hernia, soft and reducible.   Genitourinary:  Genitourinary Comments: Foley in place, no surrounding purulence or drainage  Musculoskeletal: She exhibits no edema.  Neurological: She is alert  and oriented to person, place, and time. She exhibits normal muscle tone.  Skin: Skin is warm. Capillary refill takes less than 2 seconds.  Psychiatric: She has a normal mood and affect.  Nursing note and vitals reviewed.    ED Treatments / Results  Labs (all labs ordered are listed, but only abnormal results are displayed) Labs Reviewed  CBC WITH DIFFERENTIAL/PLATELET - Abnormal; Notable for the following:       Result Value   WBC 13.5 (*)    RBC 3.49 (*)    Hemoglobin 10.1 (*)    HCT 31.2 (*)    RDW 16.2 (*)    Neutro Abs 10.7 (*)    Monocytes Absolute 1.5 (*)    All other components within normal limits  BASIC METABOLIC PANEL - Abnormal; Notable for the following:    Sodium 130 (*)    Chloride 92 (*)    Glucose, Bld 367 (*)    Creatinine, Ser 1.19 (*)    Calcium 8.8 (*)    GFR calc non Af Amer 46 (*)    GFR calc Af Amer 54 (*)    All other components within normal limits  MAGNESIUM - Abnormal; Notable for the following:    Magnesium 1.5 (*)    All other components within normal limits  URINALYSIS, ROUTINE W REFLEX MICROSCOPIC - Abnormal; Notable for the following:    Color, Urine COLORLESS (*)    Specific Gravity, Urine 1.000 (*)    All other components within normal limits  I-STAT CHEM 8, ED - Abnormal; Notable for the following:    Sodium 133 (*)    Chloride 94 (*)  Creatinine, Ser 1.10 (*)    Glucose, Bld 392 (*)    Calcium, Ion 1.11 (*)    Hemoglobin 9.5 (*)    HCT 28.0 (*)    All other components within normal limits    EKG  EKG Interpretation None       Radiology Dg Chest 2 View  Result Date: 09/11/2017 CLINICAL DATA:  Hyponatremia EXAM: CHEST  2 VIEW COMPARISON:  August 25, 2017 FINDINGS: There is slight scarring in the right base. There is no evident edema or consolidation. Heart size and pulmonary vascularity are normal. No adenopathy. There is aortic atherosclerosis. There is mild degenerative change in the thoracic spine. There are surgical  clips in the right neck region. IMPRESSION: Mild scarring right base. No edema or consolidation. Stable cardiac silhouette. There is aortic atherosclerosis. Aortic Atherosclerosis (ICD10-I70.0). Electronically Signed   By: Lowella Grip III M.D.   On: 09/11/2017 21:28   Ct Head Wo Contrast  Result Date: 09/11/2017 CLINICAL DATA:  Altered mental status.  Hyponatremia. EXAM: CT HEAD WITHOUT CONTRAST TECHNIQUE: Contiguous axial images were obtained from the base of the skull through the vertex without intravenous contrast. COMPARISON:  August 21, 2017 FINDINGS: Brain: There is mild diffuse atrophy, stable. There is no intracranial mass, hemorrhage, extra-axial fluid collection, or midline shift. There is patchy small vessel disease in the centra semiovale bilaterally. Elsewhere gray-white compartments appear unremarkable. No acute infarct is appreciable. Vascular: There is no evident hyperdense vessel. There are foci of calcification in each carotid siphon and distal vertebral artery regions. Skull: The bony calvarium appears intact. Sinuses/Orbits: There is mucosal thickening in several ethmoid air cells bilaterally. Other visualized paranasal sinuses are clear. Orbits appear symmetric bilaterally. Other: Mastoid air cells are clear. IMPRESSION: Atrophy with periventricular small vessel disease, stable. No intracranial mass, hemorrhage, or extra-axial fluid collection. No acute infarct evident. Foci of arterial vascular calcification noted. Mucosal thickening noted in several ethmoid air cells. Electronically Signed   By: Lowella Grip III M.D.   On: 09/11/2017 21:26    Procedures Procedures (including critical care time)  Medications Ordered in ED Medications  sodium chloride 0.9 % bolus 1,000 mL (0 mLs Intravenous Stopped 09/11/17 2227)  magnesium sulfate IVPB 2 g 50 mL (0 g Intravenous Stopped 09/11/17 2334)  insulin glargine (LANTUS) injection 30 Units (30 Units Subcutaneous Given 09/11/17 2347)      Initial Impression / Assessment and Plan / ED Course  I have reviewed the triage vital signs and the nursing notes.  Pertinent labs & imaging results that were available during my care of the patient were reviewed by me and considered in my medical decision making (see chart for details).     66 yo F here with hyponatremia on outside labs. She is o/w asymptomatic. Na 130 here (reported as 126 on outside labs), but glu elevated at 367 so this corrects to 134, which is pt's baseline. She has a h/o DM. I suspect there is also a component of dehydration as pt appears mildly hemoconcentrated and dehydrated clinically, likely 2/2 her hyperglycemia. Pt given fluids here with improvement in Na. She was given her night-time lantus dose. Will have her f/u with her PCP re: insulin adjustments. Pt o/w remains asymptomatic.  Final Clinical Impressions(s) / ED Diagnoses   Final diagnoses:  Dehydration  Hyperglycemia  Hypomagnesemia    New Prescriptions Discharge Medication List as of 09/11/2017 11:27 PM       Duffy Bruce, MD 09/12/17 1159

## 2017-09-14 ENCOUNTER — Ambulatory Visit: Payer: Medicare Other | Admitting: Cardiovascular Disease

## 2017-09-23 ENCOUNTER — Telehealth: Payer: Self-pay | Admitting: Neurology

## 2017-09-23 NOTE — Telephone Encounter (Signed)
Would you please call Lauren [name] and let her know that I have discussed IVIG with both her cardiologist as well as her urologist and both say she can go ahead and restart the treatments just as she was before. Would you please let her know and see if there is anything we need to do to get her scheduled again? thanks

## 2017-09-23 NOTE — Telephone Encounter (Signed)
Spoke to patient's husband on HIPAA Lauren [name]).  He is aware of Dr. Cathren Laine message below.  He stated they would like to hold off on IVIG for now - at least until her next cardiology appt.  He said they would contact our office to reschedule.  She has been getting her IVIG w/ [facility].

## 2017-10-13 ENCOUNTER — Encounter: Payer: Self-pay | Admitting: Internal Medicine

## 2017-10-13 ENCOUNTER — Other Ambulatory Visit (INDEPENDENT_AMBULATORY_CARE_PROVIDER_SITE_OTHER): Payer: Medicare Other

## 2017-10-13 ENCOUNTER — Ambulatory Visit (INDEPENDENT_AMBULATORY_CARE_PROVIDER_SITE_OTHER): Payer: Medicare Other | Admitting: Internal Medicine

## 2017-10-13 VITALS — BP 98/60 | HR 68

## 2017-10-13 DIAGNOSIS — R932 Abnormal findings on diagnostic imaging of liver and biliary tract: Secondary | ICD-10-CM

## 2017-10-13 DIAGNOSIS — R748 Abnormal levels of other serum enzymes: Secondary | ICD-10-CM | POA: Diagnosis not present

## 2017-10-13 DIAGNOSIS — R7989 Other specified abnormal findings of blood chemistry: Secondary | ICD-10-CM

## 2017-10-13 DIAGNOSIS — R945 Abnormal results of liver function studies: Principal | ICD-10-CM

## 2017-10-13 LAB — HEPATIC FUNCTION PANEL
ALBUMIN: 3.7 g/dL (ref 3.5–5.2)
ALK PHOS: 64 U/L (ref 39–117)
ALT: 10 U/L (ref 0–35)
AST: 12 U/L (ref 0–37)
Bilirubin, Direct: 0 mg/dL (ref 0.0–0.3)
Total Bilirubin: 0.3 mg/dL (ref 0.2–1.2)
Total Protein: 7.4 g/dL (ref 6.0–8.3)

## 2017-10-13 NOTE — Patient Instructions (Signed)
If you are age 67 or older, your body mass index should be between 23-30. Your There is no height or weight on file to calculate BMI. If this is out of the aforementioned range listed, please consider follow up with your Primary Care Provider.  If you are age 46 or younger, your body mass index should be between 19-25. Your There is no height or weight on file to calculate BMI. If this is out of the aformentioned range listed, please consider follow up with your Primary Care Provider.   Your physician has requested that you go to the basement for lab work before leaving today.  Thank you for choosing me and [facility].  Gatha Mayer, MD, [facility]

## 2017-10-13 NOTE — Progress Notes (Signed)
Lauren [name] 66 y.o. 1951-06-01 [id number]  Assessment & Plan:   Encounter Diagnoses  Name Primary?  . Abnormal transaminases Yes  . Abnormal liver CT    Recheck LFT'sAnd they returned normal. If NL I think ok to restart MTX  I don't think she needs a liver biopsy at this time though if she goes back on methotrexate certainly regular monitoring of her LFTs is important.  Cc Dr. Marguerite Olea of dermatology Lauren Late, MD   Subjective:   Chief Complaint: f/u abnl lft/CT  HPI Since last seen in August For abnormal transaminases and question of liver problems based upon CT findings she was at hospital:  Admitted 8/24-28 Altered mental status, metabolic encephalopathy (drug w/d), hydronephrosis and AKI ED 9/14 "dehydration"  Sig diuresis Still w/ edema though less and wgt down but skin red w/ slight breakdown LE's Overall feeling much better. Dermatologist as stopped methotrexate. Rash is coming back. Lab Results  Component Value Date   ALT 21 08/22/2017   AST 24 08/22/2017   ALKPHOS 45 08/22/2017   BILITOT 0.6 08/22/2017    Allergies  Allergen Reactions  . Ambien [Zolpidem Tartrate] Other (See Comments)    Per the pt, "it makes me go crazy"  . Sulfa Antibiotics Rash  . Elavil [Amitriptyline Hcl] Other (See Comments)    Gastritis  . Prozac [Fluoxetine Hcl] Anxiety and Other (See Comments)    irritability    Current Meds  Medication Sig  . Albuterol Sulfate (PROAIR RESPICLICK) 371 (90 Base) MCG/ACT AEPB Inhale 2 puffs into the lungs 4 (four) times daily as needed (congestion, cough, wheezing).  Marland Kitchen amLODipine (NORVASC) 5 MG tablet Take 1 tablet (5 mg total) by mouth daily.  Marland Kitchen antiseptic oral rinse (BIOTENE) LIQD 15 mLs by Mouth Rinse route 4 (four) times daily.  Marland Kitchen aspirin EC 81 MG tablet Take 81 mg by mouth daily.  . Cholecalciferol (VITAMIN D3) 1000 UNITS CAPS Take 1,000 Units by mouth daily.   Marland Kitchen escitalopram (LEXAPRO) 20 MG tablet Take 20 mg by mouth  daily.  Marland Kitchen esomeprazole (NEXIUM) 40 MG capsule Take 40 mg by mouth daily.   . ferrous sulfate 325 (65 FE) MG tablet Take 325 mg by mouth daily.   . Fluticasone-Salmeterol (ADVAIR) 250-50 MCG/DOSE AEPB Inhale 1 puff into the lungs 2 (two) times daily as needed (shortness of breath).   . folic acid (FOLVITE) 1 MG tablet Take 1 mg by mouth daily.  . furosemide (LASIX) 40 MG tablet Take 40 mg by mouth daily.   Marland Kitchen gabapentin (NEURONTIN) 600 MG tablet Take 600 mg by mouth 6 (six) times daily. 6am, 10am, 4pm, 6pm, 10pm, midnight  . glucose blood (ACCU-CHEK AVIVA PLUS) test strip 1 each by Other route as needed. Use as instructed   . guaiFENesin (MUCINEX) 600 MG 12 hr tablet Take 600 mg by mouth 2 (two) times daily.  . insulin glargine (LANTUS) 100 UNIT/ML injection Inject 0.3 mLs (30 Units total) into the skin at bedtime.  Marland Kitchen losartan (COZAAR) 100 MG tablet Take 100 mg by mouth daily.   . magnesium oxide (MAG-OX) 400 MG tablet Take 400 mg by mouth daily.  . Melatonin 10 MG TABS Take 10 mg by mouth at bedtime.  . metaxalone (SKELAXIN) 800 MG tablet Take 800 mg by mouth 3 (three) times daily. 6am, noon, 6pm  . metFORMIN (GLUCOPHAGE) 500 MG tablet Take 500 mg by mouth daily.  . metoprolol succinate (TOPROL-XL) 50 MG 24 hr tablet Take 50 mg by mouth  daily.  . Multiple Vitamin (MULTIVITAMIN WITH MINERALS) TABS tablet Take 1 tablet by mouth daily.  . nitroGLYCERIN (NITROSTAT) 0.4 MG SL tablet Place 1 tablet (0.4 mg total) under the tongue every 5 (five) minutes as needed for chest pain.  . nortriptyline (PAMELOR) 10 MG capsule Take 10 mg by mouth at bedtime.   . ondansetron (ZOFRAN-ODT) 4 MG disintegrating tablet Take 4 mg by mouth every 8 (eight) hours as needed for nausea or vomiting.   . OXYCONTIN 15 MG 12 hr tablet Take 1 tablet (15 mg total) by mouth every 12 (twelve) hours.  . polyethylene glycol (MIRALAX / GLYCOLAX) packet Take 17 g by mouth 2 (two) times daily.  . rosuvastatin (CRESTOR) 5 MG tablet  Take 5 mg by mouth at bedtime.  . senna (SENOKOT) 8.6 MG TABS tablet Take 2 tablets (17.2 mg total) by mouth at bedtime.  . tapentadol (NUCYNTA) 50 MG tablet Take 1 tablet (50 mg total) by mouth every 6 (six) hours. 6 am, noon, 6pm  And midnight  . vitamin B-12 (CYANOCOBALAMIN) 1000 MCG tablet Take 1,000 mcg by mouth daily.   Past Medical History:  Diagnosis Date  . Adenomatous colon polyp   . Anemia   . Anxiety   . AVM (arteriovenous malformation)    cecum  . CAD (coronary artery disease)   . Carotid artery occlusion   . Cataract    bil cateracts removed  . COPD (chronic obstructive pulmonary disease) ([place])   . Cough   . DDD (degenerative disc disease), lumbar   . Depression   . Diabetes mellitus   . Elevated LFTs   . Endometrial ca [facility])    endometrial ca dx 4/11  . GERD (gastroesophageal reflux disease)   . Gout 2015  . Hernia of flank   . Hydronephrosis   . Hyperlipidemia   . Hypertension   . Insomnia   . OAB (overactive bladder)   . Osteoarthritis   . Peripheral neuropathy    following chemotherapy  . Psoriasis   . Pulmonary nodule    since 2016  . Radiation 05/01/2011-06/11/11   5600 cGy 28 fxs/periaortic  . Stroke ([place])    tia  . Thyroid nodule   . TIA (transient ischemic attack) 2015  . 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 Uterine cancer ([place])   . Ventral hernia    postoperative since 2009   Past Surgical History:  Procedure Laterality Date  . ABDOMINAL HYSTERECTOMY  2009   for ca  . BACK SURGERY  Aug/sept 2014   3 back surgeries  (Aug 2014 & Sept 2014)  . CHOLECYSTECTOMY     biliteral  . COLONOSCOPY  11/13/2014   negative except for a small cecal AVM and 1 polyp  . ENDARTERECTOMY Right 08/17/2015   Procedure: Right Carotid Endartarectomy with patch Angioplasty ;  Surgeon: Mal Misty, MD;  Location: [place];  Service: Vascular;  Laterality: Right;  . EYE SURGERY Right Aug. 2013   Cataract, lens implant  . EYE SURGERY  Left Sept. 2013   Cataract, lens implant  . I & D of lumbar fusion /removal of hardware for chronic infection  Jan. 8, 2015  . right carotid endarterectomy  2016  . SPINE SURGERY      Review of Systems Still fairly weak, needs assistance with ambulation still has some dyspnea on exertion  Objective:   Physical Exam BP 98/60 (BP Location: Left Arm, Patient Position: Sitting, Cuff  Size: Normal)   Pulse 68  Frail, chronically ill in wheelchair Eyes anicteric Skin pale  Ext 1+ bilat pretib edema with erythema and slight skin breakdown Neuro a and o x 3   I reviewed cardiology notes, hospital records as per history of present illness labs etc.

## 2017-10-15 ENCOUNTER — Encounter: Payer: Self-pay | Admitting: Internal Medicine

## 2017-10-16 ENCOUNTER — Ambulatory Visit (INDEPENDENT_AMBULATORY_CARE_PROVIDER_SITE_OTHER): Payer: Medicare Other | Admitting: Vascular Surgery

## 2017-10-16 ENCOUNTER — Encounter: Payer: Self-pay | Admitting: Vascular Surgery

## 2017-10-16 VITALS — BP 119/61 | HR 62 | Temp 97.7°F | Resp 18 | Ht 67.0 in | Wt 183.0 lb

## 2017-10-16 DIAGNOSIS — I6529 Occlusion and stenosis of unspecified carotid artery: Secondary | ICD-10-CM | POA: Diagnosis not present

## 2017-10-16 DIAGNOSIS — I739 Peripheral vascular disease, unspecified: Secondary | ICD-10-CM

## 2017-10-16 NOTE — Progress Notes (Signed)
Patient ID: Lauren [name], female   DOB: 1951/02/13, 66 y.o.   MRN: [id number]  Reason for Consult: new evaluation (Dr. Sandi Mariscal referred abnormal CT on 08-07)   Referred by [name], MD  Subjective:     HPI:  Lauren [name] is a 66 y.o. female known to [place] from previous carotid endarterectomy and followed for carotid stenosis is less than 40% at her last visit. She is unfortunately had infected hardware in her back now has an indwelling Foley catheter and has a known very large ventral hernia that she has not had surgery before. She also has a rash of her bilateral lower extremities that is, since her stay in the nursing home and she is to see a dermatologist for that. She does have bilateral lower extremity pain that she attributes to neuropathy. She is currently not walking is wheelchair-bound but does not have any rest pain in her feet that she attributes to vascular nature has no tissue loss or ulceration.She presents today for evaluation of her aortic stenosis evaluated with CT scan recently.  Past Medical History:  Diagnosis Date  . Adenomatous colon polyp   . Anemia   . Anxiety   . AVM (arteriovenous malformation)    cecum  . CAD (coronary artery disease)   . Carotid artery occlusion   . Cataract    bil cateracts removed  . COPD (chronic obstructive pulmonary disease) ([place])   . Cough   . DDD (degenerative disc disease), lumbar   . Depression   . Diabetes mellitus   . Elevated LFTs   . Endometrial ca [facility])    endometrial ca dx 4/11  . GERD (gastroesophageal reflux disease)   . Gout 2015  . Hernia of flank   . Hydronephrosis   . Hyperlipidemia   . Hypertension   . Insomnia   . OAB (overactive bladder)   . Osteoarthritis   . Peripheral neuropathy    following chemotherapy  . Psoriasis   . Pulmonary nodule    since 2016  . Radiation 05/01/2011-06/11/11   5600 cGy 28 fxs/periaortic  . Stroke ([place])    tia  . Thyroid nodule   . TIA (transient ischemic  attack) 2015  . 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 Uterine cancer ([place])   . Ventral hernia    postoperative since 2009   Family History  Problem Relation Age of Onset  . Stroke Mother   . Irritable bowel syndrome Mother   . Heart attack Father 13       died  . Heart disease Father        Heart Disease before age 67, rheumatic heart disease  . Hyperlipidemia Father   . Hypertension Father   . Diabetes Daughter   . Hypertension Daughter   . Irritable bowel syndrome Daughter   . Colon cancer Neg Hx   . Esophageal cancer Neg Hx   . Pancreatic cancer Neg Hx   . Rectal cancer Neg Hx   . Stomach cancer Neg Hx   . Neuropathy Neg Hx    Past Surgical History:  Procedure Laterality Date  . ABDOMINAL HYSTERECTOMY  2009   for ca  . BACK SURGERY  Aug/sept 2014   3 back surgeries  (Aug 2014 & Sept 2014)  . CHOLECYSTECTOMY     biliteral  . COLONOSCOPY  11/13/2014   negative except for a small cecal AVM and 1 polyp  . ENDARTERECTOMY Right 08/17/2015  Procedure: Right Carotid Endartarectomy with patch Angioplasty ;  Surgeon: Mal Misty, MD;  Location: [place];  Service: Vascular;  Laterality: Right;  . EYE SURGERY Right Aug. 2013   Cataract, lens implant  . EYE SURGERY Left Sept. 2013   Cataract, lens implant  . I & D of lumbar fusion /removal of hardware for chronic infection  Jan. 8, 2015  . right carotid endarterectomy  2016  . SPINE SURGERY      Short Social History:  Social History  Substance Use Topics  . Smoking status: Former Smoker    Packs/day: 0.10    Years: 40.00    Types: Cigarettes    Quit date: 01/2017  . Smokeless tobacco: Never Used  . Alcohol use No    Allergies  Allergen Reactions  . Ambien [Zolpidem Tartrate] Other (See Comments)    Per the pt, "it makes me go crazy"  . Sulfa Antibiotics Rash  . Elavil [Amitriptyline Hcl] Other (See Comments)    Gastritis  . Prozac [Fluoxetine Hcl] Anxiety and Other (See Comments)      irritability     Current Outpatient Prescriptions  Medication Sig Dispense Refill  . Albuterol Sulfate (PROAIR RESPICLICK) 409 (90 Base) MCG/ACT AEPB Inhale 2 puffs into the lungs 4 (four) times daily as needed (congestion, cough, wheezing).    Marland Kitchen amLODipine (NORVASC) 5 MG tablet Take 1 tablet (5 mg total) by mouth daily. 30 tablet 0  . antiseptic oral rinse (BIOTENE) LIQD 15 mLs by Mouth Rinse route 4 (four) times daily.    Marland Kitchen aspirin EC 81 MG tablet Take 81 mg by mouth daily.    . Cholecalciferol (VITAMIN D3) 1000 UNITS CAPS Take 1,000 Units by mouth daily.     Marland Kitchen escitalopram (LEXAPRO) 20 MG tablet Take 20 mg by mouth daily.    Marland Kitchen esomeprazole (NEXIUM) 40 MG capsule Take 40 mg by mouth daily.     . ferrous sulfate 325 (65 FE) MG tablet Take 325 mg by mouth daily.     . Fluticasone-Salmeterol (ADVAIR) 250-50 MCG/DOSE AEPB Inhale 1 puff into the lungs 2 (two) times daily as needed (shortness of breath).     . folic acid (FOLVITE) 1 MG tablet Take 1 mg by mouth daily.    . furosemide (LASIX) 40 MG tablet Take 40 mg by mouth daily.     Marland Kitchen gabapentin (NEURONTIN) 600 MG tablet Take 600 mg by mouth 6 (six) times daily. 6am, 10am, 4pm, 6pm, 10pm, midnight    . glucose blood (ACCU-CHEK AVIVA PLUS) test strip 1 each by Other route as needed. Use as instructed     . guaiFENesin (MUCINEX) 600 MG 12 hr tablet Take 600 mg by mouth 2 (two) times daily.    . insulin glargine (LANTUS) 100 UNIT/ML injection Inject 0.3 mLs (30 Units total) into the skin at bedtime. 10 mL 11  . losartan (COZAAR) 100 MG tablet Take 100 mg by mouth daily.     . magnesium oxide (MAG-OX) 400 MG tablet Take 400 mg by mouth daily.    . Melatonin 10 MG TABS Take 10 mg by mouth at bedtime.    . metaxalone (SKELAXIN) 800 MG tablet Take 800 mg by mouth 3 (three) times daily. 6am, noon, 6pm    . metFORMIN (GLUCOPHAGE) 500 MG tablet Take 500 mg by mouth daily.    . metoprolol succinate (TOPROL-XL) 50 MG 24 hr tablet Take 50 mg by mouth  daily.    . Multiple Vitamin (MULTIVITAMIN  WITH MINERALS) TABS tablet Take 1 tablet by mouth daily.    . nitroGLYCERIN (NITROSTAT) 0.4 MG SL tablet Place 1 tablet (0.4 mg total) under the tongue every 5 (five) minutes as needed for chest pain. 25 tablet 3  . nortriptyline (PAMELOR) 10 MG capsule Take 10 mg by mouth at bedtime.     . ondansetron (ZOFRAN-ODT) 4 MG disintegrating tablet Take 4 mg by mouth every 8 (eight) hours as needed for nausea or vomiting.     . OXYCONTIN 15 MG 12 hr tablet Take 1 tablet (15 mg total) by mouth every 12 (twelve) hours. 4 tablet 0  . polyethylene glycol (MIRALAX / GLYCOLAX) packet Take 17 g by mouth 2 (two) times daily. 100 each 0  . rosuvastatin (CRESTOR) 5 MG tablet Take 5 mg by mouth at bedtime.    . senna (SENOKOT) 8.6 MG TABS tablet Take 2 tablets (17.2 mg total) by mouth at bedtime. 120 each 0  . tapentadol (NUCYNTA) 50 MG tablet Take 1 tablet (50 mg total) by mouth every 6 (six) hours. 6 am, noon, 6pm  And midnight 8 tablet 0  . vitamin B-12 (CYANOCOBALAMIN) 1000 MCG tablet Take 1,000 mcg by mouth daily.     No current facility-administered medications for this visit.     Review of Systems  Constitutional:  Constitutional negative. HENT: HENT negative.  Eyes: Eyes negative.  Respiratory: Positive for shortness of breath and wheezing.  Cardiovascular: Positive for leg swelling.  Musculoskeletal: Positive for back pain, gait problem and leg pain.  Skin: Positive for rash.  Hematologic: Hematologic/lymphatic negative.  Psychiatric: Psychiatric negative.        Objective:  Objective   Vitals:   10/16/17 1442  BP: 119/61  Pulse: 62  Resp: 18  Temp: 97.7 F (36.5 C)  TempSrc: Oral  SpO2: 95%  Weight: 183 lb (83 kg)  Height: 5\' 7"  (1.702 m)   Body mass index is 28.66 kg/m.  Physical Exam  Constitutional: She appears well-developed.  HENT:  Head: Normocephalic.  Eyes: Pupils are equal, round, and reactive to light.  Neck: Normal range  of motion.  Well healed cea site  Cardiovascular:  Pulses:      Femoral pulses are 0 on the right side, and 0 on the left side. Monophasic dp/pt bilaterally  Pulmonary/Chest: Effort normal.  Abdominal: Soft. She exhibits no mass.  Reducible hernia  Musculoskeletal: Normal range of motion. She exhibits edema.  Lymphadenopathy:    She has no cervical adenopathy.  Skin: Rash noted.  Psychiatric: She has a normal mood and affect. Her behavior is normal. Judgment and thought content normal.    Data: Study Result     EXAM: CT ABDOMEN AND PELVIS WITH CONTRAST  1. There is rather marked hydronephrosis and ureterectasis on the right without mass or calculus seen in the right renal collecting system or ureter. Urinary bladder is midline. Question recent calculus passage. Hydronephrosis could present in this manner. Given the degree of hydronephrosis on the right, urologic consultation is felt to be advisable.  2. Large ventral hernia again noted. Multiple loops of small and large bowel extend into this hernia without bowel compromise.  3. No bowel obstruction. No abscess. Appendix appears normal. Diffuse stool in colon.  4. Liver is prominent with a subtly nodular contour. Question degree of underlying hepatic cirrhosis. No focal liver lesions are demonstrable. Gallbladder is absent with biliary duct dilatation. No biliary duct mass or calculus evident.  5. Atrophy of the pancreas in the  head region with mild prominence of the pancreatic duct in the head region. This appearance is stable compared to the 2015 study. If there is concern for pancreatic lesion in this area, MR pre and post-contrast of the pancreas could be helpful to further assess.  6. Aortoiliac atherosclerosis. Multiple areas of what appear to be hemodynamically significant obstructing lesions in the common iliac arteries as well as in the distal common femoral arteries. There also appears to be  hemodynamically significant obstruction due to calcification at the aortic bifurcation. No aneurysm evident.  7. Postoperative change at the lumbosacral junction with advanced bony remodeling. Spondylolisthesis of L4 on L5 noted. There is postoperative change in this area.  8.  Uterus absent.  No pelvic mass evident.        Assessment/Plan:     66 year old female well-known to our office now sent for evaluation of aortic stenosis with bilateral common iliac artery calcifications. I cannot reliably feel femoral pulses she has monophasic signals in her bilateral DP and PTs. She does not appear to have pain is vascular in nature and would not want to undergo other procedures at this time given her recent history of, occasions and now she is in a nursing home. From the staple we'll get her follow-up in 6 months we'll follow her carotid duplex at that time as well as get ABIs. She does has good understanding if she has any issues with her feet between now and then we will consider angiogram bilateral common iliac artery stenting. She is otherwise been instructed to protect her feet particularly by wearing closed in shoes on the wheelchair.     [name] MD [facility]

## 2017-10-19 ENCOUNTER — Telehealth: Payer: Self-pay | Admitting: Internal Medicine

## 2017-10-19 NOTE — Telephone Encounter (Signed)
Labs faxed via Epic to [phone] to attention Crystal.  Labs were from 10/13/17

## 2017-10-19 NOTE — Telephone Encounter (Signed)
I left a message for Crystal to call back.

## 2017-10-28 ENCOUNTER — Other Ambulatory Visit: Payer: Self-pay | Admitting: Family Medicine

## 2017-10-28 DIAGNOSIS — M5416 Radiculopathy, lumbar region: Secondary | ICD-10-CM

## 2017-10-30 ENCOUNTER — Ambulatory Visit: Payer: Medicare Other | Admitting: Internal Medicine

## 2017-11-03 NOTE — Addendum Note (Signed)
Addended by: Lianne Cure A on: 11/03/2017 12:49 PM   Modules accepted: Orders

## 2017-11-04 ENCOUNTER — Ambulatory Visit
Admission: RE | Admit: 2017-11-04 | Discharge: 2017-11-04 | Disposition: A | Discharge status: Home / Self Care | Payer: Medicare Other | Source: Ambulatory Visit | Attending: Family Medicine | Admitting: Family Medicine

## 2017-11-04 DIAGNOSIS — M5416 Radiculopathy, lumbar region: Secondary | ICD-10-CM

## 2017-11-04 MED ORDER — METHYLPREDNISOLONE ACETATE 40 MG/ML INJ SUSP (RADIOLOG
120.0000 mg | Freq: Once | INTRAMUSCULAR | Status: AC
  Administered 2017-11-04: 120 mg via EPIDURAL

## 2017-11-04 MED ORDER — IOPAMIDOL (ISOVUE-M 200) INJECTION 41%
1.0000 mL | Freq: Once | INTRAMUSCULAR | Status: AC
  Administered 2017-11-04: 1 mL via EPIDURAL

## 2017-11-04 NOTE — Discharge Instructions (Signed)

## 2017-11-12 ENCOUNTER — Ambulatory Visit (INDEPENDENT_AMBULATORY_CARE_PROVIDER_SITE_OTHER): Payer: Medicare Other | Admitting: Cardiovascular Disease

## 2017-11-12 ENCOUNTER — Encounter: Payer: Self-pay | Admitting: Cardiovascular Disease

## 2017-11-12 VITALS — BP 131/61 | HR 65 | Ht 65.5 in | Wt 187.0 lb

## 2017-11-12 DIAGNOSIS — E1159 Type 2 diabetes mellitus with other circulatory complications: Secondary | ICD-10-CM | POA: Diagnosis not present

## 2017-11-12 DIAGNOSIS — I6523 Occlusion and stenosis of bilateral carotid arteries: Secondary | ICD-10-CM | POA: Diagnosis not present

## 2017-11-12 DIAGNOSIS — R945 Abnormal results of liver function studies: Secondary | ICD-10-CM | POA: Diagnosis not present

## 2017-11-12 DIAGNOSIS — R7989 Other specified abnormal findings of blood chemistry: Secondary | ICD-10-CM

## 2017-11-12 DIAGNOSIS — E782 Mixed hyperlipidemia: Secondary | ICD-10-CM

## 2017-11-12 DIAGNOSIS — I2781 Cor pulmonale (chronic): Secondary | ICD-10-CM

## 2017-11-12 DIAGNOSIS — I2584 Coronary atherosclerosis due to calcified coronary lesion: Secondary | ICD-10-CM

## 2017-11-12 DIAGNOSIS — I50812 Chronic right heart failure: Secondary | ICD-10-CM | POA: Diagnosis not present

## 2017-11-12 DIAGNOSIS — I2510 Atherosclerotic heart disease of native coronary artery without angina pectoris: Secondary | ICD-10-CM | POA: Diagnosis not present

## 2017-11-12 DIAGNOSIS — I739 Peripheral vascular disease, unspecified: Secondary | ICD-10-CM

## 2017-11-12 DIAGNOSIS — I10 Essential (primary) hypertension: Secondary | ICD-10-CM

## 2017-11-12 DIAGNOSIS — Z794 Long term (current) use of insulin: Secondary | ICD-10-CM

## 2017-11-12 NOTE — Progress Notes (Signed)
Cardiology Office Note    Date:  11/14/2017   ID:  Lauren [name], DOB 11/22/1951, MRN [id number]  PCP:  [name], MD  Cardiologist:   Sanda Klein, MD   Chief Complaint  Patient presents with  . Follow-up    3-4 months;  Congestive heart failure  History of Present Illness:  Lauren [name] is a 66 y.o. female will with a couple of weeks with biventricular, primarily right heart failure returning in follow-up.  Cause of right heart failure is primarily severe COPD (FEV1 0.8 L, 30% predicted). Now in rehab at [facility], but hopes to return home in a week or 2.  She has severe muscle weakness due to deconditioning and multiple comorbid problems.  She has issues with nausea presumed to be secondary to gastroparesis.  She is very sedentary but denies problems with dyspnea.  She has less edema when I last saw her; she weighs 8 pounds less than at her previous appointment  Really  Lauren [name] has coronary artery calcification on CT scan and has numerous coronary risk factors (hypertension, hyperlipidemia, diabetes, positive family history, ongoing cigarette smoking) and already has well-established peripheral arterial disease (R carotid endarterectomy 2016, L carotid 40%). However, she does not have angina pectoris and had a low risk nuclear stress test in 2014 and again in 2016. Echo in 2017 showed normal LVEF, mild LVH, elevated mean LA pressure and mild PAH 36 mm Hg). Her abdominal-pelvic CT also showed probably significant stenosis of the aorto-bilateral iliac bifurcation and multiple stenoses in the iliac and common femoral arteries on bothe sides.  Activity is limited by severe lumbar radiculopathy, especially bad left sciatica pain. She has severe sensorimotor neuropathy. In 2015 she had EGD and colonoscopy and was found to have angiodysplasia. Repeat EGD in 2017 showed only mild antral erythema.  Past Medical History:  Diagnosis Date  . Adenomatous colon polyp   .  Anemia   . Anxiety   . AVM (arteriovenous malformation)    cecum  . CAD (coronary artery disease)   . Carotid artery occlusion   . Cataract    bil cateracts removed  . COPD (chronic obstructive pulmonary disease) ([place])   . Cough   . DDD (degenerative disc disease), lumbar   . Depression   . Diabetes mellitus   . Elevated LFTs   . Endometrial ca [facility])    endometrial ca dx 4/11  . GERD (gastroesophageal reflux disease)   . Gout 2015  . Hernia of flank   . Hydronephrosis   . Hyperlipidemia   . Hypertension   . Insomnia   . OAB (overactive bladder)   . Osteoarthritis   . Peripheral neuropathy    following chemotherapy  . Psoriasis   . Pulmonary nodule    since 2016  . Radiation 05/01/2011-06/11/11   5600 cGy 28 fxs/periaortic  . Stroke ([place])    tia  . Thyroid nodule   . TIA (transient ischemic attack) 2015  . 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 Uterine cancer ([place])   . Ventral hernia    postoperative since 2009    Past Surgical History:  Procedure Laterality Date  . ABDOMINAL HYSTERECTOMY  2009   for ca  . BACK SURGERY  Aug/sept 2014   3 back surgeries  (Aug 2014 & Sept 2014)  . CHOLECYSTECTOMY     biliteral  . COLONOSCOPY  11/13/2014   negative except for a small cecal AVM and  1 polyp  . EYE SURGERY Right Aug. 2013   Cataract, lens implant  . EYE SURGERY Left Sept. 2013   Cataract, lens implant  . I & D of lumbar fusion /removal of hardware for chronic infection  Jan. 8, 2015  . Right Carotid Endartarectomy with patch Angioplasty Right 08/17/2015   Performed by Mal Misty, MD at [facility]  . right carotid endarterectomy  2016  . SPINE SURGERY      Current Medications: Outpatient Medications Prior to Visit  Medication Sig Dispense Refill  . Albuterol Sulfate (PROAIR RESPICLICK) 185 (90 Base) MCG/ACT AEPB Inhale 2 puffs into the lungs 4 (four) times daily as needed (congestion, cough, wheezing).    Marland Kitchen amLODipine (NORVASC) 5 MG  tablet Take 1 tablet (5 mg total) by mouth daily. 30 tablet 0  . antiseptic oral rinse (BIOTENE) LIQD 15 mLs by Mouth Rinse route 4 (four) times daily.    Marland Kitchen aspirin EC 81 MG tablet Take 81 mg by mouth daily.    . Cholecalciferol (VITAMIN D3) 1000 UNITS CAPS Take 1,000 Units by mouth daily.     Marland Kitchen escitalopram (LEXAPRO) 20 MG tablet Take 20 mg by mouth daily.    Marland Kitchen esomeprazole (NEXIUM) 40 MG capsule Take 40 mg by mouth daily.     . ferrous sulfate 325 (65 FE) MG tablet Take 325 mg by mouth daily.     . Fluticasone-Salmeterol (ADVAIR) 250-50 MCG/DOSE AEPB Inhale 1 puff into the lungs 2 (two) times daily as needed (shortness of breath).     . folic acid (FOLVITE) 1 MG tablet Take 1 mg by mouth daily.    . furosemide (LASIX) 40 MG tablet Take 40 mg by mouth daily.     Marland Kitchen gabapentin (NEURONTIN) 600 MG tablet Take 600 mg by mouth 6 (six) times daily. 6am, 10am, 4pm, 6pm, 10pm, midnight    . glucose blood (ACCU-CHEK AVIVA PLUS) test strip 1 each by Other route as needed. Use as instructed     . guaiFENesin (MUCINEX) 600 MG 12 hr tablet Take 600 mg by mouth 2 (two) times daily.    . insulin glargine (LANTUS) 100 UNIT/ML injection Inject 0.3 mLs (30 Units total) into the skin at bedtime. 10 mL 11  . losartan (COZAAR) 100 MG tablet Take 100 mg by mouth daily.     . magnesium oxide (MAG-OX) 400 MG tablet Take 400 mg by mouth daily.    . Melatonin 10 MG TABS Take 10 mg by mouth at bedtime.    . metaxalone (SKELAXIN) 800 MG tablet Take 800 mg by mouth 3 (three) times daily. 6am, noon, 6pm    . metFORMIN (GLUCOPHAGE) 500 MG tablet Take 500 mg by mouth daily.    . metoprolol succinate (TOPROL-XL) 50 MG 24 hr tablet Take 50 mg by mouth daily.    . Multiple Vitamin (MULTIVITAMIN WITH MINERALS) TABS tablet Take 1 tablet by mouth daily.    . nitroGLYCERIN (NITROSTAT) 0.4 MG SL tablet Place 1 tablet (0.4 mg total) under the tongue every 5 (five) minutes as needed for chest pain. 25 tablet 3  . nortriptyline  (PAMELOR) 10 MG capsule Take 10 mg by mouth at bedtime.     . ondansetron (ZOFRAN-ODT) 4 MG disintegrating tablet Take 4 mg by mouth every 8 (eight) hours as needed for nausea or vomiting.     . OXYCONTIN 15 MG 12 hr tablet Take 1 tablet (15 mg total) by mouth every 12 (twelve) hours. 4 tablet 0  .  polyethylene glycol (MIRALAX / GLYCOLAX) packet Take 17 g by mouth 2 (two) times daily. 100 each 0  . rosuvastatin (CRESTOR) 5 MG tablet Take 5 mg by mouth at bedtime.    . senna (SENOKOT) 8.6 MG TABS tablet Take 2 tablets (17.2 mg total) by mouth at bedtime. 120 each 0  . tapentadol (NUCYNTA) 50 MG tablet Take 1 tablet (50 mg total) by mouth every 6 (six) hours. 6 am, noon, 6pm  And midnight 8 tablet 0  . vitamin B-12 (CYANOCOBALAMIN) 1000 MCG tablet Take 1,000 mcg by mouth daily.     No facility-administered medications prior to visit.      Allergies:   Ambien [zolpidem tartrate]; Sulfa antibiotics; Elavil [amitriptyline hcl]; and Prozac [fluoxetine hcl]   Social History   Socioeconomic History  . Marital status: Married    Spouse name: None  . Number of children: 1  . Years of education: College  . Highest education level: None  Social Needs  . Financial resource strain: None  . Food insecurity - worry: None  . Food insecurity - inability: None  . Transportation needs - medical: None  . Transportation needs - non-medical: None  Occupational History  . Occupation: disabled  Tobacco Use  . Smoking status: Former Smoker    Packs/day: 0.10    Years: 40.00    Pack years: 4.00    Types: Cigarettes    Last attempt to quit: 01/2017    Years since quitting: 0.7  . Smokeless tobacco: Never Used  Substance and Sexual Activity  . Alcohol use: No    Alcohol/week: 0.0 oz  . Drug use: No  . Sexual activity: No  Other Topics Concern  . None  Social History Narrative   Married, 1 daughter   Disabled   Right-hand   Caffeine: caffeine free sodas     Family History:  The patient's  family history includes Diabetes in her daughter; Heart attack (age of onset: 72) in her father; Heart disease in her father; Hyperlipidemia in her father; Hypertension in her daughter and father; Irritable bowel syndrome in her daughter and mother; Stroke in her mother.   ROS:   Please see the history of present illness.    ROS All other systems reviewed and are negative.   PHYSICAL EXAM:   VS:  BP 131/61   Pulse 65   Ht 5' 5.5" (1.664 m)   Wt 187 lb (84.8 kg)   BMI 30.65 kg/m     General: Alert, oriented x3, no distress, borderline obese Head: no evidence of trauma, PERRL, EOMI, no exophtalmos or lid lag, no myxedema, no xanthelasma; normal ears, nose and oropharynx Neck: norm minimally elevated l jugular venous pulsations and no hepatojugular reflux; brisk carotid pulses without delay and no carotid bruits Chest: clear to auscultation, no signs of consolidation by percussion or palpation, normal fremitus, symmetrical and full respiratory excursions Cardiovascular: normal position and quality of the apical impulse, regular rhythm, normal first and second heart sounds, no murmurs, rubs or gallops Abdomen: no tenderness or distention, no masses by palpation, no abnormal pulsatility or arterial bruits, normal bowel sounds, no hepatosplenomegaly Extremities: no clubbing, cyanosis, trace bilateral ankle, prominent varicose veins, no stasis ulcers edema; 2+ radial, ulnar and brachial pulses bilaterally; 2+ right femoral, posterior tibial and dorsalis pedis pulses; 2+ left femoral, posterior tibial and dorsalis pedis pulses; no subclavian or femoral bruits Neurological: grossly nonfocal Psych: Normal mood and affect   Wt Readings from Last 3 Encounters:  11/12/17  187 lb (84.8 kg)  10/16/17 183 lb (83 kg)  09/11/17 183 lb (83 kg)      Studies/Labs Reviewed:   EKG:  EKG is not  ordered today.    Recent Labs: 09/02/2016 Total cholesterol 150, LDL 86, LDL particle #1065, HDL 38,  triglyceride 129 Hemoglobin A1c 5.8% Creatinine 1.15, hemoglobin 9.9 with microcytic hypochromic indices, normal TSH September 14 ferritin 18, iron saturation 15%  07/20/2017 and 07/30/2017: BNP 156, A1c 7.2%, Hgb 10.4, AST 55, ALT 111, Bili 0.3, alk phos 49, K 4.6, creat 1.21, glu 79  September 21, 2017 labs showed hemoglobin 10.7, normal liver function tests, creatinine 0.9, NT-proBNP 330 (upper range of normal), hemoglobin A1c 8.1%, mildly reduced magnesium of 1.6He is normally or early normal leg nausea  15 ASSESSMENT:    1. Chronic right-sided heart failure ([place])   2. Cor pulmonale, chronic ([place])   3. Abnormal liver function tests   4. Coronary artery calcification of native artery   5. PAD (peripheral artery disease) ([facility])   6. Carotid stenosis, bilateral   7. Essential hypertension, benign   8. Type 2 diabetes mellitus with other circulatory complication, with long-term current use of insulin ([place])   9. Mixed hyperlipidemia      PLAN:  In order of problems listed above:  1. CHF: She has lost weight since last appointment and appears to be at or very close to euvolemic status.  No change in diuretic.  Continue daily weights.  It will be very poor to continue daily weights when she gets home.  We will try to see if home health can provide a scale that she can hold onto while weighing.  Denies dyspnea, predominantly right heart failure. 2. RHF/cor pulmonale/PAH: probably the major part of her fluid retention problem. 3. Abnormal liver tests: Her liver function tests have normalized with volume reduction. 4. Coronary calcification:  Mrs. Gaetz has normal left ventricular systolic function and denies angina pectoris.  Although it is very likely she has coronary artery disease there is no indication for invasive evaluation. 5. PAD:  If she were more active, suspect she could have claudication. 6. Carotid stenosis: only mild plaque at this time, last Doppler study in February 2018.  Asymptomatic. Patent right endarterectomy site and stable mild left-sided obstruction. 7. HTN: Well-controlled continue same medications 8. DM: Glycemic control has deteriorated slightly. 9. HLP: Acceptable LDL cholesterol level, unlikely see improvement in HDL without substantial additional true weight loss 10. Tobacco abuse: Both she and her husband have quit smoking long-term now. 11. Iron deficiency anemia: Slight improvement. Recent EGD low risk. Known to have angiodysplasia of the right colon. If aspirin needs to be interrupted temporarily, I don't think this would be a higher risk from a cardiac point of view. However, in the long run she really needs aspirin therapy due to her carotid disease and potential coronary problems.    Medication Adjustments/Labs and Tests Ordered: Current medicines are reviewed at length with the patient today.  Concerns regarding medicines are outlined above.  Medication changes, Labs and Tests ordered today are listed in the Patient Instructions below. Patient Instructions  Dr Sallyanne Kuster recommends that you continue on your current medications as directed. Please refer to the Current Medication list given to you today.  Your physician recommends that you weigh, daily, at the same time every day, and in the same amount of clothing.   Dr Sallyanne Kuster recommends that you schedule a follow-up appointment in 4 months.  If you need a  refill on your cardiac medications before your next appointment, please call your pharmacy.    Signed, Sanda Klein, MD  11/14/2017 3:11 PM    [facility] [place] [place], [place], [place]  [address] Phone: [phone]; Fax: [phone]

## 2017-11-12 NOTE — Patient Instructions (Addendum)
Dr Sallyanne Kuster recommends that you continue on your current medications as directed. Please refer to the Current Medication list given to you today.  Your physician recommends that you weigh, daily, at the same time every day, and in the same amount of clothing.   Dr Sallyanne Kuster recommends that you schedule a follow-up appointment in 4 months.  If you need a refill on your cardiac medications before your next appointment, please call your pharmacy.

## 2017-11-14 DIAGNOSIS — R945 Abnormal results of liver function studies: Secondary | ICD-10-CM | POA: Insufficient documentation

## 2017-11-14 DIAGNOSIS — R7989 Other specified abnormal findings of blood chemistry: Secondary | ICD-10-CM | POA: Insufficient documentation

## 2018-01-07 ENCOUNTER — Encounter: Payer: Self-pay | Admitting: Internal Medicine

## 2018-01-07 ENCOUNTER — Ambulatory Visit (INDEPENDENT_AMBULATORY_CARE_PROVIDER_SITE_OTHER): Payer: Medicare Other | Admitting: Internal Medicine

## 2018-01-07 VITALS — BP 124/74 | HR 55 | Ht 65.5 in | Wt 195.0 lb

## 2018-01-07 DIAGNOSIS — I6523 Occlusion and stenosis of bilateral carotid arteries: Secondary | ICD-10-CM

## 2018-01-07 DIAGNOSIS — J449 Chronic obstructive pulmonary disease, unspecified: Secondary | ICD-10-CM

## 2018-01-07 NOTE — Patient Instructions (Signed)
No change in pulmonary medications   If you start having more problems that aren't obviously related to fluid retention , please return asao

## 2018-01-07 NOTE — Assessment & Plan Note (Signed)
Quit smoking 01/2017  Spirometry 07/30/2017  FEV1 0.79 (30%)  Ratio 53 s rx prior/ classic curvature    As I explained to this patient in detail:  although there is severe copd present, it may not be clinically relevant:   it does not appear to be limiting activity tolerance any more than a set of worn tires limits someone from driving a car  around a parking lot.  A new set of Michelins might look good but would have no perceived impact on the performance of the car and would not be worth the cost.  That is to say:   this pt is so sedentary I don't recommend escalating  pulmonary rx at this point unless limiting symptoms arise or acute exacerbations become as issue, neither of which is the case now.  I asked the patient to contact this office at any time in the future should either of these problems arise.    Pulmonary f/u can be prn    I had an extended discussion with the patient and husband reviewing all relevant studies completed to date and  lasting 15 to 20 minutes of a 25 minute visit    Each maintenance medication was reviewed in detail including most importantly the difference between maintenance and prns and under what circumstances the prns are to be triggered using an action plan format that is not reflected in the computer generated alphabetically organized AVS.    Please see AVS for specific instructions unique to this visit that I personally wrote and verbalized to the the pt in detail and then reviewed with pt  by my nurse highlighting any  changes in therapy recommended at today's visit to their plan of care.

## 2018-01-07 NOTE — Progress Notes (Addendum)
Subjective:     Patient ID: Lauren [name], female   DOB: 01/13/1951,    MRN: [id number]  HPI  89 yowf  Chronic back problems/ very little ambulation since 2015/16 quit smoking 01/2017 p admit:  Date of Admission: 02/02/2017                        Date of Discharge: 02/06/17  Indication for Hospitalization: sepsis secondary to CAP  Discharge Diagnoses/Problem List:      Patient Active Problem List   Diagnosis Date Noted  . Pressure ulcer of left heel, stage 2 02/04/2017  . Altered mental status   . Community acquired pneumonia   . Encephalopathy   . Fall   . Weakness of both lower extremities   . Other chronic pain   . Narcotic dependence ([place])   . Incontinence of feces   . Sepsis ([place]) 02/02/2017  . Bilateral carotid artery stenosis 01/30/2017  . Aftercare following surgery of the circulatory system 01/30/2017  . Impingement syndrome of left shoulder 12/17/2016  . Coronary artery calcification of native artery 10/07/2016  . Iron (Fe) deficiency anemia 10/07/2016  . DDD (degenerative disc disease), lumbar 10/03/2016  . Fatigue 10/03/2016  . Intervertebral disc disease 10/03/2016  . Low back pain 10/03/2016  . Myelopathy ([place]) 10/03/2016  . Prolapsed lumbosacral intervertebral disc 10/03/2016  . Spondylisthesis 10/03/2016  . MRSA (methicillin resistant staph aureus) culture positive 10/10/2015  . Carotid stenosis 08/17/2015  . Preoperative cardiovascular examination 08/10/2015  . Tobacco abuse 08/10/2015  . 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 Acute osteomyelitis of spine ([place]) 08/28/2014  . Difficult or painful urination 08/28/2014  . H/O Spinal surgery 08/28/2014  . Elevated WBC count 08/28/2014  . Encounter for aftercare for long-term (current) use of antibiotics 08/28/2014  . N&V (nausea and vomiting) 08/28/2014  . Bladder retention 08/28/2014  . Infection of urinary tract 08/28/2014  . DM2 (diabetes mellitus, type 2) ([place]) 06/15/2013  . Ventral hernia  06/15/2013  . COPD (chronic obstructive pulmonary disease) ([place]) 06/15/2013  . DJD (degenerative joint disease) of lumbar spine 06/15/2013  . Depression 06/15/2013  . Exertional angina ([place]) 06/15/2013  . Preoperative evaluation 06/15/2013  . HTN (hypertension) 06/15/2013  . Occlusion and stenosis of carotid artery without mention of cerebral infarction 03/02/2012  . Malignant neoplasm of corpus uteri, except isthmus ([place]) 01/19/2012  . Abnormal EKG 12/07/2011  . Essential hypertension, benign 12/07/2011  . Hyperlipidemia with low HDL 12/07/2011  . Carotid artery disease without cerebral infarction [facility]) 12/07/2011           07/30/2017 1st [facility] office visit/ Lauren [name]   Chief Complaint  Patient presents with  . Pulmonary Consult    Referred by Dr. [name] for eval of pulmonary nodule. Pt is unsure why cxr was ordered. She denies any respiratory co's.   since admit has not regained her strength and uses w/c to go out now  Also RA/ also on macrodantin  Sleeps in recliner due to back maybe 30 degrees  Takes advair 250 "prn" sob  rec If breathing worsens resume the advair 250 twice daily  Please schedule a follow up visit in 3  months but call sooner if needed  With cxr on return     01/07/2018  f/u ov/Lauren [name] re:  GOLD III copd  Chief Complaint  Patient presents with  . Follow-up    Breathing is overall doing well. She has had a cough for the past wk- occ  prod with clear sputum.    walks across room stops due to weak legs not sob maint on advair 250 bid and no saba needed  Has hosp bed  < 30 degrees/ no edema / no 02  No obvious day to day or daytime variability or asso  purulent sputum or mucus plugs or hemoptysis or cp or chest tightness, subjective wheeze or overt sinus or hb symptoms. No unusual exposure hx or h/o childhood pna/ asthma or knowledge of premature birth.  Sleeping ok up < 30 degrees without 0 or octurnal  or early am exacerbation  of respiratory   c/o's or need for noct saba. Also denies any obvious fluctuation of symptoms with weather or environmental changes or other aggravating or alleviating factors except as outlined above   Current Allergies, Complete Past Medical History, Past Surgical History, Family History, and Social History were reviewed in [employer] record.  ROS  The following are not active complaints unless bolded Hoarseness, sore throat, dysphagia, dental problems, itching, sneezing,  nasal congestion or discharge of excess mucus or purulent secretions, ear ache,   fever, chills, sweats, unintended wt loss or wt gain, classically pleuritic or exertional cp,  orthopnea pnd or leg swelling, presyncope, palpitations, abdominal pain, anorexia, nausea, vomiting, diarrhea  or change in bowel habits or change in bladder habits, change in stools or change in urine, dysuria, hematuria,  rash, arthralgias, visual complaints, headache, numbness, weakness or ataxia or problems with walking or coordination,  change in mood/affect or memory.        Current Meds  Medication Sig  . Albuterol Sulfate (PROAIR RESPICLICK) 409 (90 Base) MCG/ACT AEPB Inhale 2 puffs into the lungs 4 (four) times daily as needed (congestion, cough, wheezing).  Marland Kitchen amLODipine (NORVASC) 5 MG tablet Take 1 tablet (5 mg total) by mouth daily.  Marland Kitchen antiseptic oral rinse (BIOTENE) LIQD 15 mLs by Mouth Rinse route 4 (four) times daily.  Marland Kitchen aspirin EC 81 MG tablet Take 81 mg by mouth daily.  . Cholecalciferol (VITAMIN D3) 1000 UNITS CAPS Take 1,000 Units by mouth daily.   Marland Kitchen escitalopram (LEXAPRO) 20 MG tablet Take 20 mg by mouth daily.  Marland Kitchen esomeprazole (NEXIUM) 40 MG capsule Take 40 mg by mouth daily.   . ferrous sulfate 325 (65 FE) MG tablet Take 325 mg by mouth daily.   . Fluticasone-Salmeterol (ADVAIR) 250-50 MCG/DOSE AEPB Inhale 1 puff into the lungs 2 (two) times daily as needed (shortness of breath).   . folic acid (FOLVITE) 1 MG tablet Take 1 mg  by mouth daily.  . furosemide (LASIX) 40 MG tablet Take 40 mg by mouth daily.   Marland Kitchen gabapentin (NEURONTIN) 600 MG tablet Take 600 mg by mouth 6 (six) times daily. 6am, 10am, 4pm, 6pm, 10pm, midnight  . glucose blood (ACCU-CHEK AVIVA PLUS) test strip 1 each by Other route as needed. Use as instructed   . insulin glargine (LANTUS) 100 UNIT/ML injection Inject 0.3 mLs (30 Units total) into the skin at bedtime. (Patient taking differently: Inject 50 Units into the skin at bedtime. )  . losartan (COZAAR) 100 MG tablet Take 100 mg by mouth daily.   . magnesium oxide (MAG-OX) 400 MG tablet Take 400 mg by mouth daily.  . Melatonin 10 MG TABS Take 10 mg by mouth at bedtime.  . metaxalone (SKELAXIN) 800 MG tablet Take 800 mg by mouth 3 (three) times daily. 6am, noon, 6pm  . metoprolol succinate (TOPROL-XL) 50 MG 24 hr tablet Take  50 mg by mouth daily.  . Multiple Vitamin (MULTIVITAMIN WITH MINERALS) TABS tablet Take 1 tablet by mouth daily.  . nitroGLYCERIN (NITROSTAT) 0.4 MG SL tablet Place 1 tablet (0.4 mg total) under the tongue every 5 (five) minutes as needed for chest pain.  . nortriptyline (PAMELOR) 10 MG capsule Take 10 mg by mouth at bedtime.   . ondansetron (ZOFRAN-ODT) 4 MG disintegrating tablet Take 4 mg by mouth every 8 (eight) hours as needed for nausea or vomiting.   . OXYCONTIN 15 MG 12 hr tablet Take 1 tablet (15 mg total) by mouth every 12 (twelve) hours.  . rosuvastatin (CRESTOR) 5 MG tablet Take 5 mg by mouth at bedtime.  . senna (SENOKOT) 8.6 MG TABS tablet Take 2 tablets (17.2 mg total) by mouth at bedtime.  . tapentadol (NUCYNTA) 50 MG tablet Take 1 tablet (50 mg total) by mouth every 6 (six) hours. 6 am, noon, 6pm  And midnight  . vitamin B-12 (CYANOCOBALAMIN) 1000 MCG tablet Take 1,000 mcg by mouth daily.                     Objective:   Physical Exam     Pleasant w/c bound elderly  wf / somewat rattling /congested sounding cough   01/07/2018        195   07/30/17  200 lb (90.7 kg)  05/21/17 170 lb (77.1 kg)  05/05/17 170 lb (77.1 kg)     Vital signs reviewed - Note on arrival 02 sats  96% on RA      HEENT: nl dentition, turbinates bilaterally, and oropharynx. Nl external ear canals without cough reflex   NECK :  without JVD/Nodes/TM/ nl carotid upstrokes bilaterally   LUNGS: no acc muscle use,  Nl contour chest with minimal insp and exp rhonchi bilaterally      CV:  RRR  no s3 or murmur or increase in P2, and trace bilateral sym lower ext pitting edema  ABD:  soft and nontender with nl inspiratory excursion in the supine position. No bruits or organomegaly appreciated, bowel sounds nl  MS:    ext warm without deformities, calf tenderness, cyanosis or clubbing No obvious joint restrictions   SKIN: warm and dry without lesions    NEURO:  alert, approp, nl sensorium with  no motor or cerebellar deficits apparent.         I personally reviewed images and agree with radiology impression as follows:  CXR:   09/11/17 Mild scarring right base. No edema or consolidation. Stable cardiac silhouette. There is aortic atherosclerosis.        Assessment:

## 2018-01-26 ENCOUNTER — Telehealth: Payer: Self-pay | Admitting: Cardiovascular Disease

## 2018-01-26 MED ORDER — NITROGLYCERIN 0.4 MG SL SUBL: 0.4000 mg | SUBLINGUAL_TABLET | SUBLINGUAL | 3 refills | Status: DC | PRN

## 2018-01-26 NOTE — Telephone Encounter (Signed)
Please give Patty a call back in regards to this pt. Heave chest for a few weeks now.

## 2018-01-26 NOTE — Telephone Encounter (Signed)
Spoke with patty, she reports a tightness in chest. Spoke with pt, she reports a tight feeling in her chest for about one month now. She also has an aching down her left arm related to a torn rotator cuff, this aching gets worse with movement. She reports SOB or puffing with exertion like taking a shower. She reports the tight feeling in her chest can last up to a couple hours. She is usually resting when the tightness occurs. She does not feel she needs to be seen at this time. Follow up scheduled next available. Refill for NTG sent to the pharmacy. She will call prior to appt if needed. Pt agreed with this plan.

## 2018-01-27 ENCOUNTER — Encounter: Payer: Self-pay | Admitting: Cardiology

## 2018-01-28 ENCOUNTER — Ambulatory Visit (INDEPENDENT_AMBULATORY_CARE_PROVIDER_SITE_OTHER): Payer: Medicare Other | Admitting: Cardiology

## 2018-01-28 ENCOUNTER — Encounter: Payer: Self-pay | Admitting: Cardiology

## 2018-01-28 VITALS — BP 128/59 | HR 58 | Ht 66.0 in | Wt 200.0 lb

## 2018-01-28 DIAGNOSIS — D509 Iron deficiency anemia, unspecified: Secondary | ICD-10-CM

## 2018-01-28 DIAGNOSIS — I6523 Occlusion and stenosis of bilateral carotid arteries: Secondary | ICD-10-CM

## 2018-01-28 DIAGNOSIS — I519 Heart disease, unspecified: Secondary | ICD-10-CM

## 2018-01-28 DIAGNOSIS — G8929 Other chronic pain: Secondary | ICD-10-CM

## 2018-01-28 DIAGNOSIS — I10 Essential (primary) hypertension: Secondary | ICD-10-CM

## 2018-01-28 DIAGNOSIS — I2584 Coronary atherosclerosis due to calcified coronary lesion: Secondary | ICD-10-CM

## 2018-01-28 DIAGNOSIS — R0600 Dyspnea, unspecified: Secondary | ICD-10-CM | POA: Insufficient documentation

## 2018-01-28 DIAGNOSIS — I2510 Atherosclerotic heart disease of native coronary artery without angina pectoris: Secondary | ICD-10-CM | POA: Diagnosis not present

## 2018-01-28 DIAGNOSIS — E118 Type 2 diabetes mellitus with unspecified complications: Secondary | ICD-10-CM | POA: Diagnosis not present

## 2018-01-28 DIAGNOSIS — Z794 Long term (current) use of insulin: Secondary | ICD-10-CM

## 2018-01-28 DIAGNOSIS — I739 Peripheral vascular disease, unspecified: Secondary | ICD-10-CM

## 2018-01-28 DIAGNOSIS — J449 Chronic obstructive pulmonary disease, unspecified: Secondary | ICD-10-CM | POA: Diagnosis not present

## 2018-01-28 DIAGNOSIS — E119 Type 2 diabetes mellitus without complications: Secondary | ICD-10-CM

## 2018-01-28 DIAGNOSIS — D649 Anemia, unspecified: Secondary | ICD-10-CM | POA: Diagnosis not present

## 2018-01-28 DIAGNOSIS — I5189 Other ill-defined heart diseases: Secondary | ICD-10-CM | POA: Insufficient documentation

## 2018-01-28 DIAGNOSIS — R0609 Other forms of dyspnea: Secondary | ICD-10-CM | POA: Diagnosis not present

## 2018-01-28 DIAGNOSIS — IMO0002 Reserved for concepts with insufficient information to code with codable children: Secondary | ICD-10-CM | POA: Insufficient documentation

## 2018-01-28 DIAGNOSIS — E1151 Type 2 diabetes mellitus with diabetic peripheral angiopathy without gangrene: Secondary | ICD-10-CM | POA: Insufficient documentation

## 2018-01-28 DIAGNOSIS — IMO0001 Reserved for inherently not codable concepts without codable children: Secondary | ICD-10-CM

## 2018-01-28 MED ORDER — ISOSORBIDE MONONITRATE ER 30 MG PO TB24: 30.0000 mg | ORAL_TABLET | Freq: Every day | ORAL | 4 refills | Status: DC

## 2018-01-28 NOTE — Assessment & Plan Note (Signed)
Seen on past CT. Myoview low risk 2014 and 2016.

## 2018-01-28 NOTE — Assessment & Plan Note (Signed)
Seen in the office today for increased DOE and Lt arm pain

## 2018-01-28 NOTE — Assessment & Plan Note (Signed)
S/p prior CEA and know iliac disease by CT

## 2018-01-28 NOTE — Assessment & Plan Note (Signed)
Followed by Dr Melvyn Novas. No wheezing or rhonchi on exam

## 2018-01-28 NOTE — Patient Instructions (Signed)
Medication Instructions:  INCREASE Lasix to 80 mg for three days then go back to 40 mg once a day START Imdur 30 mg take 1 tablet once a day  Labwork: Your physician recommends that you return for lab work in: TODAY-BNP, BMP, CBC  Testing/Procedures: None   Follow-Up: Your physician recommends that you schedule a follow-up appointment in: 2 weeks with Kerin Ransom, PA-C  Any Other Special Instructions Will Be Listed Below (If Applicable). If you need a refill on your cardiac medications before your next appointment, please call your pharmacy.

## 2018-01-28 NOTE — Assessment & Plan Note (Signed)
Controlled.  

## 2018-01-28 NOTE — Progress Notes (Signed)
01/28/2018 Lauren [name]   Aug 21, 1951  [id number]  Primary Physician [name], MD Primary Cardiologist: Dr Sallyanne Kuster  HPI:  67 y/o female followed by Dr Sallyanne Kuster with a history of COPD, PVD, DM, CAD by CT, chronic back pain, and diastolic dysfunction. She has no history of coronary angiogram or PCI. Myoview in 2014 and 2016 was low risk. Echo in Sept 2018 showed normal LVF with grade 2 DD. In the past it was felt she chronic Rt heart failure from her COPD but her echo in Sept showed normal PA pressures and normal RVF.   She is in the office today with complaints of DOE and Lt arm pain. She is in a wheel chair and is not very active but has been told she appears more SOB with activity recently. She is also complaining of Lt arm pain but has Lt rotator cuff problems and is not sure it not from that. She denies chest pain.   Current Outpatient Medications  Medication Sig Dispense Refill  . Albuterol Sulfate (PROAIR RESPICLICK) 998 (90 Base) MCG/ACT AEPB Inhale 2 puffs into the lungs 4 (four) times daily as needed (congestion, cough, wheezing).    Marland Kitchen amLODipine (NORVASC) 5 MG tablet Take 1 tablet (5 mg total) by mouth daily. 30 tablet 0  . antiseptic oral rinse (BIOTENE) LIQD 15 mLs by Mouth Rinse route 4 (four) times daily.    Marland Kitchen aspirin EC 81 MG tablet Take 81 mg by mouth daily.    . Cholecalciferol (VITAMIN D3) 1000 UNITS CAPS Take 1,000 Units by mouth daily.     Marland Kitchen escitalopram (LEXAPRO) 20 MG tablet Take 20 mg by mouth daily.    Marland Kitchen esomeprazole (NEXIUM) 40 MG capsule Take 40 mg by mouth daily.     . ferrous sulfate 325 (65 FE) MG tablet Take 325 mg by mouth daily.     . Fluticasone-Salmeterol (ADVAIR) 250-50 MCG/DOSE AEPB Inhale 1 puff into the lungs 2 (two) times daily as needed (shortness of breath).     . folic acid (FOLVITE) 1 MG tablet Take 1 mg by mouth daily.    . furosemide (LASIX) 40 MG tablet Take 40 mg by mouth daily.     Marland Kitchen gabapentin (NEURONTIN) 600 MG tablet Take 600  mg by mouth 6 (six) times daily. 6am, 10am, 4pm, 6pm, 10pm, midnight    . glucose blood (ACCU-CHEK AVIVA PLUS) test strip 1 each by Other route as needed. Use as instructed     . guaiFENesin (MUCINEX) 600 MG 12 hr tablet Take 600 mg by mouth as needed.     . insulin glargine (LANTUS) 100 UNIT/ML injection Inject 60 Units into the skin at bedtime.    Marland Kitchen losartan (COZAAR) 100 MG tablet Take 100 mg by mouth daily.     . magnesium oxide (MAG-OX) 400 MG tablet Take 400 mg by mouth daily.    . Melatonin 10 MG TABS Take 10 mg by mouth at bedtime.    . metaxalone (SKELAXIN) 800 MG tablet Take 800 mg by mouth 3 (three) times daily. 6am, noon, 6pm    . metoprolol succinate (TOPROL-XL) 50 MG 24 hr tablet Take 50 mg by mouth daily.    . Multiple Vitamin (MULTIVITAMIN WITH MINERALS) TABS tablet Take 1 tablet by mouth daily.    . nitroGLYCERIN (NITROSTAT) 0.4 MG SL tablet Place 1 tablet (0.4 mg total) under the tongue every 5 (five) minutes as needed for chest pain. 25 tablet 3  . nortriptyline (PAMELOR) 10  MG capsule Take 10 mg by mouth at bedtime.     . ondansetron (ZOFRAN-ODT) 4 MG disintegrating tablet Take 4 mg by mouth every 8 (eight) hours as needed for nausea or vomiting.     . OXYCONTIN 15 MG 12 hr tablet Take 1 tablet (15 mg total) by mouth every 12 (twelve) hours. 4 tablet 0  . rosuvastatin (CRESTOR) 5 MG tablet Take 5 mg by mouth at bedtime.    . senna (SENOKOT) 8.6 MG TABS tablet Take 2 tablets (17.2 mg total) by mouth at bedtime. 120 each 0  . tapentadol (NUCYNTA) 50 MG tablet Take 1 tablet (50 mg total) by mouth every 6 (six) hours. 6 am, noon, 6pm  And midnight 8 tablet 0  . vitamin B-12 (CYANOCOBALAMIN) 1000 MCG tablet Take 1,000 mcg by mouth daily.    . isosorbide mononitrate (IMDUR) 30 MG 24 hr tablet Take 1 tablet (30 mg total) by mouth daily. 30 tablet 4   No current facility-administered medications for this visit.     Allergies  Allergen Reactions  . Ambien [Zolpidem Tartrate] Other  (See Comments)    Per the pt, "it makes me go crazy"  . Sulfa Antibiotics Rash  . Elavil [Amitriptyline Hcl] Other (See Comments)    Gastritis  . Prozac [Fluoxetine Hcl] Anxiety and Other (See Comments)    irritability     Past Medical History:  Diagnosis Date  . Adenomatous colon polyp   . Anemia   . Anxiety   . AVM (arteriovenous malformation)    cecum  . CAD (coronary artery disease)   . Carotid artery occlusion   . Cataract    bil cateracts removed  . COPD (chronic obstructive pulmonary disease) ([place])   . Cough   . DDD (degenerative disc disease), lumbar   . Depression   . Diabetes mellitus   . Elevated LFTs   . Endometrial ca [facility])    endometrial ca dx 4/11  . GERD (gastroesophageal reflux disease)   . Gout 2015  . Hernia of flank   . Hydronephrosis   . Hyperlipidemia   . Hypertension   . Insomnia   . OAB (overactive bladder)   . Osteoarthritis   . Peripheral neuropathy    following chemotherapy  . Psoriasis   . Pulmonary nodule    since 2016  . Radiation 05/01/2011-06/11/11   5600 cGy 28 fxs/periaortic  . Stroke ([place])    tia  . Thyroid nodule   . TIA (transient ischemic attack) 2015  . 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 Uterine cancer ([place])   . Ventral hernia    postoperative since 2009    Social History   Socioeconomic History  . Marital status: Married    Spouse name: Not on file  . Number of children: 1  . Years of education: College  . Highest education level: Not on file  Social Needs  . Financial resource strain: Not on file  . Food insecurity - worry: Not on file  . Food insecurity - inability: Not on file  . Transportation needs - medical: Not on file  . Transportation needs - non-medical: Not on file  Occupational History  . Occupation: disabled  Tobacco Use  . Smoking status: Former Smoker    Packs/day: 0.10    Years: 40.00    Pack years: 4.00    Types: Cigarettes    Last attempt to quit: 01/2017     Years since  quitting: 0.9  . Smokeless tobacco: Never Used  Substance and Sexual Activity  . Alcohol use: No    Alcohol/week: 0.0 oz  . Drug use: No  . Sexual activity: No  Other Topics Concern  . Not on file  Social History Narrative   Married, 1 daughter   Disabled   Right-hand   Caffeine: caffeine free sodas     Family History  Problem Relation Age of Onset  . Stroke Mother   . Irritable bowel syndrome Mother   . Heart attack Father 24       died  . Heart disease Father        Heart Disease before age 51, rheumatic heart disease  . Hyperlipidemia Father   . Hypertension Father   . Diabetes Daughter   . Hypertension Daughter   . Irritable bowel syndrome Daughter   . Colon cancer Neg Hx   . Esophageal cancer Neg Hx   . Pancreatic cancer Neg Hx   . Rectal cancer Neg Hx   . Stomach cancer Neg Hx   . Neuropathy Neg Hx      Review of Systems: General: negative for chills, fever, night sweats or weight changes.  Cardiovascular: negative for chest pain, dyspnea on exertion, edema, orthopnea, palpitations, paroxysmal nocturnal dyspnea or shortness of breath Dermatological: negative for rash Respiratory: negative for cough or wheezing Urologic: negative for hematuria Abdominal: negative for nausea, vomiting, diarrhea, bright red blood per rectum, melena, or hematemesis Neurologic: negative for visual changes, syncope, or dizziness All other systems reviewed and are otherwise negative except as noted above.    Blood pressure (!) 128/59, pulse (!) 58, height 5\' 6"  (1.676 m), weight 200 lb (90.7 kg).  General appearance: alert, cooperative, no distress, moderately obese and in wheel chair Lungs: clear to auscultation bilaterally Heart: regular rate and rhythm Extremities: 1+ Rt LE edema which is chronic Skin: pale, cool, dry Neurologic: Grossly normal  EKG NSR  ASSESSMENT AND PLAN:   DOE (dyspnea on exertion) Seen in the office today for increased DOE and Lt  arm pain  Coronary artery calcification of native artery Seen on past CT. Myoview low risk 2014 and 2016.  Insulin dependent diabetes mellitus with complications (HCC) Widespread vascular disease  Essential hypertension, benign Controlled  COPD GOLD III/IV Followed by Dr Melvyn Novas. No wheezing or rhonchi on exam  PAD (peripheral artery disease) (H[facility]) S/p prior CEA and know iliac disease by CT  Chronic pain Chronic back pain and new Lt shoulder rotator cuff issue. She is followed by Dr Posey Pronto in the pain clinic  Diastolic dysfunction Grade 2 DD with normal LVF by echo 2018  Iron deficiency anemia Last Hgb 9.5, trending down. History of colonic AVM   PLAN  I suggested we add Imdur 30 mg and increase her Lasix to 80 mg daily x 3 days. Her wgt is up 5 lbs from her last OV. I explained with her co morbidities we would try and avoid cath if at all possible. I'll also check a CBC as her HGB seemed to be trending down. I'll see her back in two weeks. She knows to go to the ED at [facility] if she has worsening symptoms.   Kerin Ransom PA-C 01/28/2018 10:39 AM

## 2018-01-28 NOTE — Assessment & Plan Note (Signed)
Grade 2 DD with normal LVF by echo 2018

## 2018-01-28 NOTE — Assessment & Plan Note (Signed)
Widespread vascular disease

## 2018-01-28 NOTE — Assessment & Plan Note (Signed)
Chronic back pain and new Lt shoulder rotator cuff issue. She is followed by Dr Posey Pronto in the pain clinic

## 2018-01-28 NOTE — Assessment & Plan Note (Signed)
Last Hgb 9.5, trending down. History of colonic AVM

## 2018-01-29 LAB — CBC
Hematocrit: 34.4 % (ref 34.0–46.6)
Hemoglobin: 11.2 g/dL (ref 11.1–15.9)
MCH: 28.6 pg (ref 26.6–33.0)
MCHC: 32.6 g/dL (ref 31.5–35.7)
MCV: 88 fL (ref 79–97)
Platelets: 384 10*3/uL — ABNORMAL HIGH (ref 150–379)
RBC: 3.92 x10E6/uL (ref 3.77–5.28)
RDW: 17.6 % — ABNORMAL HIGH (ref 12.3–15.4)
WBC: 11.4 10*3/uL — ABNORMAL HIGH (ref 3.4–10.8)

## 2018-01-29 LAB — BASIC METABOLIC PANEL
BUN/Creatinine Ratio: 18 (ref 12–28)
BUN: 19 mg/dL (ref 8–27)
CO2: 26 mmol/L (ref 20–29)
Calcium: 9.4 mg/dL (ref 8.7–10.3)
Chloride: 98 mmol/L (ref 96–106)
Creatinine, Ser: 1.08 mg/dL — ABNORMAL HIGH (ref 0.57–1.00)
GFR calc Af Amer: 61 mL/min/{1.73_m2} (ref >59)
GFR calc non Af Amer: 53 mL/min/{1.73_m2} — ABNORMAL LOW (ref >59)
Glucose: 113 mg/dL — ABNORMAL HIGH (ref 65–99)
Potassium: 4.5 mmol/L (ref 3.5–5.2)
Sodium: 142 mmol/L (ref 134–144)

## 2018-01-29 LAB — BRAIN NATRIURETIC PEPTIDE: BNP: 87.1 pg/mL (ref 0.0–100.0)

## 2018-02-01 ENCOUNTER — Encounter (HOSPITAL_COMMUNITY): Payer: Medicare Other

## 2018-02-01 ENCOUNTER — Ambulatory Visit: Payer: Medicare Other | Admitting: Family

## 2018-02-11 ENCOUNTER — Ambulatory Visit (INDEPENDENT_AMBULATORY_CARE_PROVIDER_SITE_OTHER): Payer: Medicare Other | Admitting: Orthopaedic Surgery

## 2018-02-11 ENCOUNTER — Encounter (INDEPENDENT_AMBULATORY_CARE_PROVIDER_SITE_OTHER): Payer: Self-pay | Admitting: Orthopaedic Surgery

## 2018-02-11 DIAGNOSIS — I6523 Occlusion and stenosis of bilateral carotid arteries: Secondary | ICD-10-CM | POA: Diagnosis not present

## 2018-02-11 DIAGNOSIS — M21372 Foot drop, left foot: Secondary | ICD-10-CM | POA: Diagnosis not present

## 2018-02-11 NOTE — Progress Notes (Signed)
Office Visit Note   Patient: Lauren [name]           Date of Birth: November 24, 1951           MRN: [id number] Visit Date: 02/11/2018              Requested by: [name], MD [address] [place], [place] [address] PCP: [name], MD   Assessment & Plan: Visit Diagnoses:  1. Foot drop, left     Plan: At this point, we have written a new prescription for a custom fit AFO brace with a T strap and a left heel lift.  We are hopeful that this will help.  I have discussed the need for her to follow-up with neuro.  She will follow-up with [place] on an as-needed basis.  Follow-Up Instructions: Return if symptoms worsen or fail to improve.   Orders:  No orders of the defined types were placed in this encounter.  No orders of the defined types were placed in this encounter.     Procedures: No procedures performed   Clinical Data: No additional findings.   Subjective: Chief Complaint  Patient presents with  . Left Foot - Follow-up    HPI Lauren [name] is a pleasant 67 year old new patient with multiple comorbidities who presents our clinic today with left foot drop.  This is been ongoing for the past several years.  She states that this started  following lumbar surgery by Dr. Patrice Paradise a few years back.  She was operated on 3 times with the third one being for infection.  This is since dramatically worsened and she is unable to ambulate at this time.  Of note she is a diabetic and has peripheral neuropathy as well.  She states that she had an AFO at one point, but was not properly fit and did not function well.  Review of Systems as detailed in HPI.  All others reviewed and are negative.   Objective: Vital Signs: There were no vitals taken for this visit.  Physical Exam well-developed well-nourished female no acute distress.  Alert and oriented x3.  Ortho Exam examination of her left foot: She is unable to dorsiflex her foot or great toe.  She has decreased sensation to  the entire foot.  Specialty Comments:  No specialty comments available.  Imaging: No new imaging today   PMFS History: Patient Active Problem List   Diagnosis Date Noted  . Foot drop, left 02/11/2018  . DOE (dyspnea on exertion) 01/28/2018  . Insulin dependent diabetes mellitus with complications ([place]) 41/96/2229  . Diastolic dysfunction 79/89/2119  . Abnormal liver function tests 11/14/2017  . CIDP (chronic inflammatory demyelinating polyneuropathy) ([place]) 09/09/2017  . Acute encephalopathy 08/22/2017  . Acute urinary retention 08/22/2017  . AKI (acute kidney injury) ([place]) 08/22/2017  . PAD (peripheral artery disease) ([place]) 08/15/2017  . Cor pulmonale, chronic ([place]) 08/15/2017  . Acute on chronic diastolic heart failure ([place]) 08/15/2017  . History of cigarette smoking 08/15/2017  . Multiple pulmonary nodules determined by computed tomography of lung 07/31/2017  . 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 Pressure ulcer of left heel, stage 2 02/04/2017  . Altered mental status   . Community acquired pneumonia   . Encephalopathy   . Fall   . Weakness of both lower extremities   . Chronic pain   . Narcotic dependence ([place])   . Incontinence of feces   . Sepsis ([place]) 02/02/2017  . Carotid stenosis, bilateral 01/30/2017  . Aftercare following  surgery of the circulatory system 01/30/2017  . Impingement syndrome of left shoulder 12/17/2016  . Coronary artery calcification of native artery 10/07/2016  . Iron deficiency anemia 10/07/2016  . DDD (degenerative disc disease), lumbar 10/03/2016  . Fatigue 10/03/2016  . Intervertebral disc disease 10/03/2016  . Low back pain 10/03/2016  . Myelopathy ([place]) 10/03/2016  . Prolapsed lumbosacral intervertebral disc 10/03/2016  . Spondylisthesis 10/03/2016  . MRSA (methicillin resistant staph aureus) culture positive 10/10/2015  . Carotid stenosis 08/17/2015  . Preoperative cardiovascular examination 08/10/2015  . Tobacco abuse  08/10/2015  . 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 Acute osteomyelitis of spine ([place]) 08/28/2014  . Difficult or painful urination 08/28/2014  . H/O Spinal surgery 08/28/2014  . Elevated WBC count 08/28/2014  . Encounter for aftercare for long-term (current) use of antibiotics 08/28/2014  . N&V (nausea and vomiting) 08/28/2014  . Bladder retention 08/28/2014  . Infection of urinary tract 08/28/2014  . Ventral hernia 06/15/2013  . COPD GOLD III/IV 06/15/2013  . DJD (degenerative joint disease) of lumbar spine 06/15/2013  . Depression 06/15/2013  . Exertional angina ([place]) 06/15/2013  . Preoperative evaluation 06/15/2013  . Occlusion and stenosis of carotid artery without mention of cerebral infarction 03/02/2012  . Malignant neoplasm of corpus uteri, except isthmus ([place]) 01/19/2012  . Abnormal EKG 12/07/2011  . Essential hypertension, benign 12/07/2011  . Mixed hyperlipidemia 12/07/2011  . Carotid artery disease without cerebral infarction ([place]) 12/07/2011   Past Medical History:  Diagnosis Date  . Adenomatous colon polyp   . Anemia   . Anxiety   . AVM (arteriovenous malformation)    cecum  . CAD (coronary artery disease)   . Carotid artery occlusion   . Cataract    bil cateracts removed  . COPD (chronic obstructive pulmonary disease) ([place])   . Cough   . DDD (degenerative disc disease), lumbar   . Depression   . Diabetes mellitus   . Elevated LFTs   . Endometrial ca [facility])    endometrial ca dx 4/11  . GERD (gastroesophageal reflux disease)   . Gout 2015  . Hernia of flank   . Hydronephrosis   . Hyperlipidemia   . Hypertension   . Insomnia   . OAB (overactive bladder)   . Osteoarthritis   . Peripheral neuropathy    following chemotherapy  . Psoriasis   . Pulmonary nodule    since 2016  . Radiation 05/01/2011-06/11/11   5600 cGy 28 fxs/periaortic  . Stroke ([place])    tia  . Thyroid nodule   . TIA (transient ischemic attack) 2015  . 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 Uterine cancer ([place])   . Ventral hernia    postoperative since 2009    Family History  Problem Relation Age of Onset  . Stroke Mother   . Irritable bowel syndrome Mother   . Heart attack Father 83       died  . Heart disease Father        Heart Disease before age 74, rheumatic heart disease  . Hyperlipidemia Father   . Hypertension Father   . Diabetes Daughter   . Hypertension Daughter   . Irritable bowel syndrome Daughter   . Colon cancer Neg Hx   . Esophageal cancer Neg Hx   . Pancreatic cancer Neg Hx   . Rectal cancer Neg Hx   . Stomach cancer Neg Hx   . Neuropathy Neg Hx     Past  Surgical History:  Procedure Laterality Date  . ABDOMINAL HYSTERECTOMY  2009   for ca  . BACK SURGERY  Aug/sept 2014   3 back surgeries  (Aug 2014 & Sept 2014)  . CHOLECYSTECTOMY     biliteral  . COLONOSCOPY  11/13/2014   negative except for a small cecal AVM and 1 polyp  . ENDARTERECTOMY Right 08/17/2015   Procedure: Right Carotid Endartarectomy with patch Angioplasty ;  Surgeon: Mal Misty, MD;  Location: [place];  Service: Vascular;  Laterality: Right;  . EYE SURGERY Right Aug. 2013   Cataract, lens implant  . EYE SURGERY Left Sept. 2013   Cataract, lens implant  . I & D of lumbar fusion /removal of hardware for chronic infection  Jan. 8, 2015  . right carotid endarterectomy  2016  . SPINE SURGERY     Social History   Occupational History  . Occupation: disabled  Tobacco Use  . Smoking status: Former Smoker    Packs/day: 0.10    Years: 40.00    Pack years: 4.00    Types: Cigarettes    Last attempt to quit: 01/2017    Years since quitting: 1.0  . Smokeless tobacco: Never Used  Substance and Sexual Activity  . Alcohol use: No    Alcohol/week: 0.0 oz  . Drug use: No  . Sexual activity: No

## 2018-02-15 ENCOUNTER — Telehealth (INDEPENDENT_AMBULATORY_CARE_PROVIDER_SITE_OTHER): Payer: Self-pay | Admitting: Orthopaedic Surgery

## 2018-02-15 NOTE — Telephone Encounter (Signed)
02/11/2018 OV NOTE FAXED TO Alison Stalling [phone]

## 2018-02-16 ENCOUNTER — Encounter: Payer: Self-pay | Admitting: Cardiology

## 2018-02-16 ENCOUNTER — Ambulatory Visit (INDEPENDENT_AMBULATORY_CARE_PROVIDER_SITE_OTHER): Payer: Medicare Other | Admitting: Cardiology

## 2018-02-16 VITALS — BP 128/64 | HR 64 | Ht 66.5 in | Wt 200.3 lb

## 2018-02-16 DIAGNOSIS — M21372 Foot drop, left foot: Secondary | ICD-10-CM | POA: Diagnosis not present

## 2018-02-16 DIAGNOSIS — G8929 Other chronic pain: Secondary | ICD-10-CM

## 2018-02-16 DIAGNOSIS — I739 Peripheral vascular disease, unspecified: Secondary | ICD-10-CM

## 2018-02-16 DIAGNOSIS — I6523 Occlusion and stenosis of bilateral carotid arteries: Secondary | ICD-10-CM

## 2018-02-16 DIAGNOSIS — J449 Chronic obstructive pulmonary disease, unspecified: Secondary | ICD-10-CM

## 2018-02-16 DIAGNOSIS — R918 Other nonspecific abnormal finding of lung field: Secondary | ICD-10-CM | POA: Diagnosis not present

## 2018-02-16 DIAGNOSIS — I5033 Acute on chronic diastolic (congestive) heart failure: Secondary | ICD-10-CM

## 2018-02-16 DIAGNOSIS — K5520 Angiodysplasia of colon without hemorrhage: Secondary | ICD-10-CM | POA: Diagnosis not present

## 2018-02-16 NOTE — Progress Notes (Signed)
Thanks MCr 

## 2018-02-16 NOTE — Patient Instructions (Signed)
[place], [place] wants you to follow-up in: 6 months with Dr. Sallyanne Kuster. You will receive a reminder letter in the mail two months in advance. If you don't receive a letter, please call our office to schedule the follow-up appointment.

## 2018-02-16 NOTE — Progress Notes (Signed)
02/16/2018 Lauren [name]   Feb 13, 1951  [id number]  Primary Physician [name], MD Primary Cardiologist: Dr Sallyanne Kuster  HPI:  67 y/o female followed by Dr Sallyanne Kuster with a history of COPD, PVD, DM, CAD by CT, chronic back pain, and diastolic dysfunction. She has no history of coronary angiogram or PCI.  She had a Myoview in 2014 and again 2016 and they were low risk. Echo in Sept 2018 showed normal LVF with grade 2 DD. In the past it was felt she chronic Rt heart failure from her COPD but her echo in Sept showed normal PA pressures and normal RVF. She has RA and is pretty much wheel chair bound now.  I saw her a few weeks ago for increased dyspnea. I increased her Lasix for a few doses and she is seen today in follow up. She says she is better , l;ess SOB, back to baseline. She is unable to weigh herself at home.    Current Outpatient Medications  Medication Sig Dispense Refill  . Albuterol Sulfate (PROAIR RESPICLICK) 630 (90 Base) MCG/ACT AEPB Inhale 2 puffs into the lungs 4 (four) times daily as needed (congestion, cough, wheezing).    Marland Kitchen amLODipine (NORVASC) 5 MG tablet Take 1 tablet (5 mg total) by mouth daily. 30 tablet 0  . antiseptic oral rinse (BIOTENE) LIQD 15 mLs by Mouth Rinse route 4 (four) times daily.    Marland Kitchen aspirin EC 81 MG tablet Take 81 mg by mouth daily.    . Cholecalciferol (VITAMIN D3) 1000 UNITS CAPS Take 1,000 Units by mouth daily.     Marland Kitchen escitalopram (LEXAPRO) 20 MG tablet Take 20 mg by mouth daily.    Marland Kitchen esomeprazole (NEXIUM) 40 MG capsule Take 40 mg by mouth daily.     . ferrous sulfate 325 (65 FE) MG tablet Take 325 mg by mouth daily.     . Fluticasone-Salmeterol (ADVAIR) 250-50 MCG/DOSE AEPB Inhale 1 puff into the lungs 2 (two) times daily as needed (shortness of breath).     . folic acid (FOLVITE) 1 MG tablet Take 1 mg by mouth daily.    . furosemide (LASIX) 40 MG tablet Take 40 mg by mouth daily.     Marland Kitchen gabapentin (NEURONTIN) 600 MG tablet Take 600 mg by  mouth 3 (three) times daily.    Marland Kitchen glucose blood (ACCU-CHEK AVIVA PLUS) test strip 1 each by Other route as needed. Use as instructed     . guaiFENesin (MUCINEX) 600 MG 12 hr tablet Take 600 mg by mouth as needed.     . insulin glargine (LANTUS) 100 UNIT/ML injection Inject 70 Units into the skin at bedtime.     . isosorbide mononitrate (IMDUR) 30 MG 24 hr tablet Take 1 tablet (30 mg total) by mouth daily. 30 tablet 4  . losartan (COZAAR) 100 MG tablet Take 100 mg by mouth daily.     . magnesium oxide (MAG-OX) 400 MG tablet Take 400 mg by mouth daily.    . Melatonin 10 MG TABS Take 10 mg by mouth at bedtime.    . metaxalone (SKELAXIN) 800 MG tablet Take 800 mg by mouth 3 (three) times daily. 6am, noon, 6pm    . metoprolol succinate (TOPROL-XL) 50 MG 24 hr tablet Take 50 mg by mouth daily.    . Multiple Vitamin (MULTIVITAMIN WITH MINERALS) TABS tablet Take 1 tablet by mouth daily.    . nitroGLYCERIN (NITROSTAT) 0.4 MG SL tablet Place 1 tablet (0.4 mg total) under the  tongue every 5 (five) minutes as needed for chest pain. 25 tablet 3  . nortriptyline (PAMELOR) 10 MG capsule Take 10 mg by mouth at bedtime.     . ondansetron (ZOFRAN-ODT) 4 MG disintegrating tablet Take 4 mg by mouth every 8 (eight) hours as needed for nausea or vomiting.     . OXYCONTIN 15 MG 12 hr tablet Take 1 tablet (15 mg total) by mouth every 12 (twelve) hours. 4 tablet 0  . rosuvastatin (CRESTOR) 5 MG tablet Take 5 mg by mouth at bedtime.    . senna (SENOKOT) 8.6 MG tablet Take 1 tablet by mouth daily.    . tapentadol (NUCYNTA) 50 MG tablet Take 1 tablet (50 mg total) by mouth every 6 (six) hours. 6 am, noon, 6pm  And midnight 8 tablet 0  . vitamin B-12 (CYANOCOBALAMIN) 1000 MCG tablet Take 1,000 mcg by mouth daily.     No current facility-administered medications for this visit.     Allergies  Allergen Reactions  . Ambien [Zolpidem Tartrate] Other (See Comments)    Per the pt, "it makes me go crazy"  . Sulfa Antibiotics  Rash  . Elavil [Amitriptyline Hcl] Other (See Comments)    Gastritis  . Prozac [Fluoxetine Hcl] Anxiety and Other (See Comments)    irritability     Past Medical History:  Diagnosis Date  . Adenomatous colon polyp   . Anemia   . Anxiety   . AVM (arteriovenous malformation)    cecum  . CAD (coronary artery disease)   . Carotid artery occlusion   . Cataract    bil cateracts removed  . COPD (chronic obstructive pulmonary disease) ([place])   . Cough   . DDD (degenerative disc disease), lumbar   . Depression   . Diabetes mellitus   . Elevated LFTs   . Endometrial ca [facility])    endometrial ca dx 4/11  . GERD (gastroesophageal reflux disease)   . Gout 2015  . Hernia of flank   . Hydronephrosis   . Hyperlipidemia   . Hypertension   . Insomnia   . OAB (overactive bladder)   . Osteoarthritis   . Peripheral neuropathy    following chemotherapy  . Psoriasis   . Pulmonary nodule    since 2016  . Radiation 05/01/2011-06/11/11   5600 cGy 28 fxs/periaortic  . Stroke ([place])    tia  . Thyroid nodule   . TIA (transient ischemic attack) 2015  . 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 Uterine cancer ([place])   . Ventral hernia    postoperative since 2009    Social History   Socioeconomic History  . Marital status: Married    Spouse name: Not on file  . Number of children: 1  . Years of education: College  . Highest education level: Not on file  Social Needs  . Financial resource strain: Not on file  . Food insecurity - worry: Not on file  . Food insecurity - inability: Not on file  . Transportation needs - medical: Not on file  . Transportation needs - non-medical: Not on file  Occupational History  . Occupation: disabled  Tobacco Use  . Smoking status: Former Smoker    Packs/day: 0.10    Years: 40.00    Pack years: 4.00    Types: Cigarettes    Last attempt to quit: 01/2017    Years since quitting: 1.0  . Smokeless tobacco: Never Used  Substance  and Sexual Activity  . Alcohol use: No    Alcohol/week: 0.0 oz  . Drug use: No  . Sexual activity: No  Other Topics Concern  . Not on file  Social History Narrative   Married, 1 daughter   Disabled   Right-hand   Caffeine: caffeine free sodas     Family History  Problem Relation Age of Onset  . Stroke Mother   . Irritable bowel syndrome Mother   . Heart attack Father 62       died  . Heart disease Father        Heart Disease before age 60, rheumatic heart disease  . Hyperlipidemia Father   . Hypertension Father   . Diabetes Daughter   . Hypertension Daughter   . Irritable bowel syndrome Daughter   . Colon cancer Neg Hx   . Esophageal cancer Neg Hx   . Pancreatic cancer Neg Hx   . Rectal cancer Neg Hx   . Stomach cancer Neg Hx   . Neuropathy Neg Hx      Review of Systems: General: negative for chills, fever, night sweats or weight changes.  Cardiovascular: negative for chest pain, dyspnea on exertion, edema, orthopnea, palpitations, paroxysmal nocturnal dyspnea or shortness of breath Dermatological: negative for rash Respiratory: negative for cough or wheezing Urologic: negative for hematuria Abdominal: negative for nausea, vomiting, diarrhea, bright red blood per rectum, melena, or hematemesis Neurologic: negative for visual changes, syncope, or dizziness All other systems reviewed and are otherwise negative except as noted above.    Blood pressure 128/64, pulse 64, height 5' 6.5" (1.689 m), weight 200 lb 4.8 oz (90.9 kg), SpO2 98 %.  General appearance: alert, cooperative, appears older than stated age, no distress, mildly obese and in wheel chair Neck: no carotid bruit and no JVD Lungs: clear to auscultation bilaterally Heart: regular rate and rhythm Extremities: no edema Skin: pale cool dry Neurologic: Grossly normal   ASSESSMENT AND PLAN:   DOE (dyspnea on exertion) This improved with the addition of Imdur and increased Lasix for a few doses.  Interestingly her BNP was not elevated-87  Coronary artery calcification of native artery Seen on past CT. Myoview low risk 2014 and 2016.  Insulin dependent diabetes mellitus with complications (HCC) Widespread vascular disease  Essential hypertension, benign Controlled  COPD GOLD III/IV Followed by Dr Melvyn Novas. No wheezing or rhonchi on exam  PAD (peripheral artery disease) ([facility]) S/p prior CEA and know iliac disease by CT  Chronic pain Chronic back pain and new Lt shoulder rotator cuff issue. She is followed by Dr Posey Pronto in the pain clinic  Diastolic dysfunction Grade 2 DD with normal LVF by echo 2018  Iron deficiency anemia HGb was stable 1.31.19- 11.2.  History of colonic AVM  PLAN  Same Rx, f/u 6 months.   Kerin Ransom PA-C 02/16/2018 1:47 PM

## 2018-04-16 ENCOUNTER — Ambulatory Visit: Payer: Medicare Other | Admitting: Vascular Surgery

## 2018-04-16 ENCOUNTER — Encounter (HOSPITAL_COMMUNITY): Payer: Medicare Other

## 2018-04-16 ENCOUNTER — Inpatient Hospital Stay (HOSPITAL_COMMUNITY)
Admission: RE | Admit: 2018-04-16 | Discharge: 2018-04-16 | Disposition: A | Discharge status: Home / Self Care | Payer: Medicare Other | Source: Ambulatory Visit | Attending: Vascular Surgery | Admitting: Vascular Surgery

## 2018-04-16 DIAGNOSIS — I6523 Occlusion and stenosis of bilateral carotid arteries: Secondary | ICD-10-CM

## 2018-04-16 DIAGNOSIS — I6521 Occlusion and stenosis of right carotid artery: Secondary | ICD-10-CM

## 2018-04-16 DIAGNOSIS — Z48812 Encounter for surgical aftercare following surgery on the circulatory system: Secondary | ICD-10-CM

## 2018-04-21 ENCOUNTER — Telehealth: Payer: Self-pay | Admitting: Cardiovascular Disease

## 2018-04-21 NOTE — Telephone Encounter (Signed)
Received records from Dr. [name] on 04/21/18, Appt 04/27/18 @ 10:40am. [place]

## 2018-04-27 ENCOUNTER — Ambulatory Visit (INDEPENDENT_AMBULATORY_CARE_PROVIDER_SITE_OTHER): Payer: Medicare Other | Admitting: Cardiovascular Disease

## 2018-04-27 ENCOUNTER — Encounter: Payer: Self-pay | Admitting: Cardiovascular Disease

## 2018-04-27 VITALS — BP 100/46 | HR 62 | Ht 67.0 in | Wt 208.2 lb

## 2018-04-27 DIAGNOSIS — E118 Type 2 diabetes mellitus with unspecified complications: Secondary | ICD-10-CM

## 2018-04-27 DIAGNOSIS — I10 Essential (primary) hypertension: Secondary | ICD-10-CM

## 2018-04-27 DIAGNOSIS — D500 Iron deficiency anemia secondary to blood loss (chronic): Secondary | ICD-10-CM

## 2018-04-27 DIAGNOSIS — I2781 Cor pulmonale (chronic): Secondary | ICD-10-CM | POA: Diagnosis not present

## 2018-04-27 DIAGNOSIS — I25118 Atherosclerotic heart disease of native coronary artery with other forms of angina pectoris: Secondary | ICD-10-CM | POA: Diagnosis not present

## 2018-04-27 DIAGNOSIS — I739 Peripheral vascular disease, unspecified: Secondary | ICD-10-CM

## 2018-04-27 DIAGNOSIS — Z794 Long term (current) use of insulin: Secondary | ICD-10-CM

## 2018-04-27 DIAGNOSIS — I6523 Occlusion and stenosis of bilateral carotid arteries: Secondary | ICD-10-CM

## 2018-04-27 DIAGNOSIS — I5032 Chronic diastolic (congestive) heart failure: Secondary | ICD-10-CM | POA: Diagnosis not present

## 2018-04-27 DIAGNOSIS — E782 Mixed hyperlipidemia: Secondary | ICD-10-CM

## 2018-04-27 DIAGNOSIS — IMO0001 Reserved for inherently not codable concepts without codable children: Secondary | ICD-10-CM

## 2018-04-27 NOTE — Patient Instructions (Signed)
Medication Instructions:  DECREASE- Losartan 50 mg daily  If you need a refill on your cardiac medications before your next appointment, please call your pharmacy.  Labwork: None Ordered   Testing/Procedures: None Ordered  Follow-Up: Your physician wants you to follow-up in: 6 Months You should receive a reminder letter in the mail two months in advance. If you do not receive a letter, please call our office [phone].     Thank you for choosing [facility] at [facility]!!

## 2018-04-27 NOTE — Progress Notes (Signed)
Cardiology Office Note    Date:  04/27/2018   ID:  Lauren [name], DOB Mar 06, 1951, MRN [id number]  PCP:  [name], MD  Cardiologist:   Sanda Klein, MD   Chief Complaint  Patient presents with  . Follow-up    pt c/o chest pressure and tightness  Congestive heart failure  History of Present Illness:  Lauren [name] is a 67 y.o. female with severe COPD, cor pulmonale/ right heart failure returning in follow-up.  Important comorbidities are RA and spinal stenosis (she is extremely sedentary, in wheelchair) and insulin requiring DM. She has extensive PAD and coronary calcification on CT chest. Very likely has CAD, but with successful medical management on antianginal therapy and preserved LVEF.  Edema is well controlled by her loop diuretic, occasionally (less than weekly) needs a double dose to control the edema. She is always dyspneic, even with minimal activity, often wheezes, but does not have orthopnea or PND. Minimal edema today, only at the ankles.  In mid March, woke up with a nightmare and had chest pressure for about 10 minutes, some diaphoresis, resolved without meds. Told Dr. Sandi Mariscal about it 2 weeks later, ECG was normal. ECG is normal today. The problem has not recurred.  Her BP is often very low. It was 110/62 on 3/27 and is 100/46 today. She is a little lightheaded.  Excellent lipids (Chol 112, HDL 48, LDL 48 and LDL-P 859, TG 81). Last Hgb A1c 8%.  Seeing Dr. Donzetta Matters in a few days, to have carotid duplex and ABI that same day.  [name] has coronary artery calcification on CT scan and has numerous coronary risk factors (hypertension, hyperlipidemia, diabetes, positive family history, ongoing cigarette smoking) and already has well-established peripheral arterial disease (R carotid endarterectomy 2016, L carotid 40%). However, she does not have angina pectoris and had a low risk nuclear stress test in 2014 and again in 2016. Echo in 2017 showed normal LVEF,  mild LVH, elevated mean LA pressure and mild PAH 36 mm Hg). Her abdominal-pelvic CT also showed probably significant stenosis of the aorto-bilateral iliac bifurcation and multiple stenoses in the iliac and common femoral arteries on bothe sides. Activity is limited by severe lumbar radiculopathy, especially bad left sciatica pain. She has severe sensorimotor neuropathy. In 2015 she had EGD and colonoscopy and was found to have angiodysplasia. Repeat EGD in 2017 showed only mild antral erythema.  Past Medical History:  Diagnosis Date  . Adenomatous colon polyp   . Anemia   . Anxiety   . AVM (arteriovenous malformation)    cecum  . CAD (coronary artery disease)   . Carotid artery occlusion   . Cataract    bil cateracts removed  . COPD (chronic obstructive pulmonary disease) ([place])   . Cough   . DDD (degenerative disc disease), lumbar   . Depression   . Diabetes mellitus   . Elevated LFTs   . Endometrial ca [facility])    endometrial ca dx 4/11  . GERD (gastroesophageal reflux disease)   . Gout 2015  . Hernia of flank   . Hydronephrosis   . Hyperlipidemia   . Hypertension   . Insomnia   . OAB (overactive bladder)   . Osteoarthritis   . Peripheral neuropathy    following chemotherapy  . Psoriasis   . Pulmonary nodule    since 2016  . Radiation 05/01/2011-06/11/11   5600 cGy 28 fxs/periaortic  . Stroke ([place])    tia  . Thyroid nodule   .  TIA (transient ischemic attack) 2015  . 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 Uterine cancer ([place])   . Ventral hernia    postoperative since 2009    Past Surgical History:  Procedure Laterality Date  . ABDOMINAL HYSTERECTOMY  2009   for ca  . BACK SURGERY  Aug/sept 2014   3 back surgeries  (Aug 2014 & Sept 2014)  . CHOLECYSTECTOMY     biliteral  . COLONOSCOPY  11/13/2014   negative except for a small cecal AVM and 1 polyp  . ENDARTERECTOMY Right 08/17/2015   Procedure: Right Carotid Endartarectomy with patch  Angioplasty ;  Surgeon: Mal Misty, MD;  Location: [place];  Service: Vascular;  Laterality: Right;  . EYE SURGERY Right Aug. 2013   Cataract, lens implant  . EYE SURGERY Left Sept. 2013   Cataract, lens implant  . I & D of lumbar fusion /removal of hardware for chronic infection  Jan. 8, 2015  . right carotid endarterectomy  2016  . SPINE SURGERY      Current Medications: Outpatient Medications Prior to Visit  Medication Sig Dispense Refill  . Albuterol Sulfate (PROAIR RESPICLICK) 163 (90 Base) MCG/ACT AEPB Inhale 2 puffs into the lungs 4 (four) times daily as needed (congestion, cough, wheezing).    Marland Kitchen amLODipine (NORVASC) 5 MG tablet Take 1 tablet (5 mg total) by mouth daily. 30 tablet 0  . antiseptic oral rinse (BIOTENE) LIQD 15 mLs by Mouth Rinse route as needed.     Marland Kitchen aspirin EC 81 MG tablet Take 81 mg by mouth daily.    . Cholecalciferol (VITAMIN D3) 1000 UNITS CAPS Take 1,000 Units by mouth daily.     Marland Kitchen escitalopram (LEXAPRO) 20 MG tablet Take 20 mg by mouth daily.    Marland Kitchen esomeprazole (NEXIUM) 40 MG capsule Take 40 mg by mouth daily.     . ferrous sulfate 325 (65 FE) MG tablet Take 325 mg by mouth daily.     . Fluticasone-Salmeterol (ADVAIR) 250-50 MCG/DOSE AEPB Inhale 1 puff into the lungs 2 (two) times daily as needed (shortness of breath).     . folic acid (FOLVITE) 1 MG tablet Take 1 mg by mouth daily.    . furosemide (LASIX) 40 MG tablet Take 40 mg by mouth daily.     Marland Kitchen gabapentin (NEURONTIN) 600 MG tablet Take 600 mg by mouth 3 (three) times daily.    Marland Kitchen glucose blood (ACCU-CHEK AVIVA PLUS) test strip 1 each by Other route as needed. Use as instructed     . guaiFENesin (MUCINEX) 600 MG 12 hr tablet Take 600 mg by mouth as needed.     . insulin glargine (LANTUS) 100 UNIT/ML injection Inject 70 Units into the skin at bedtime.     . isosorbide mononitrate (IMDUR) 30 MG 24 hr tablet Take 1 tablet (30 mg total) by mouth daily. 30 tablet 4  . losartan (COZAAR) 100 MG tablet Take 50  mg by mouth daily.     . magnesium oxide (MAG-OX) 400 MG tablet Take 400 mg by mouth daily.    . Melatonin 10 MG TABS Take 10 mg by mouth at bedtime.    . metaxalone (SKELAXIN) 800 MG tablet Take 800 mg by mouth 3 (three) times daily. 6am, noon, 6pm    . methotrexate 2.5 MG tablet Take 3 tablets by mouth. Every Wednesday  3  . metoprolol succinate (TOPROL-XL) 50 MG 24 hr tablet Take 50 mg  by mouth daily.    . Multiple Vitamin (MULTIVITAMIN WITH MINERALS) TABS tablet Take 1 tablet by mouth daily.    . nitroGLYCERIN (NITROSTAT) 0.4 MG SL tablet Place 1 tablet (0.4 mg total) under the tongue every 5 (five) minutes as needed for chest pain. 25 tablet 3  . nortriptyline (PAMELOR) 10 MG capsule Take 10 mg by mouth at bedtime.     . ondansetron (ZOFRAN-ODT) 4 MG disintegrating tablet Take 4 mg by mouth every 8 (eight) hours as needed for nausea or vomiting.     . OXYCONTIN 15 MG 12 hr tablet Take 1 tablet (15 mg total) by mouth every 12 (twelve) hours. 4 tablet 0  . potassium chloride (MICRO-K) 10 MEQ CR capsule Take 1 capsule by mouth 2 (two) times daily.  3  . rosuvastatin (CRESTOR) 5 MG tablet Take 5 mg by mouth at bedtime.    . senna (SENOKOT) 8.6 MG tablet Take 1 tablet by mouth daily.    . tapentadol (NUCYNTA) 50 MG tablet Take 1 tablet (50 mg total) by mouth every 6 (six) hours. 6 am, noon, 6pm  And midnight 8 tablet 0  . vitamin B-12 (CYANOCOBALAMIN) 1000 MCG tablet Take 1,000 mcg by mouth daily.     No facility-administered medications prior to visit.      Allergies:   Ambien [zolpidem tartrate]; Sulfa antibiotics; Elavil [amitriptyline hcl]; and Prozac [fluoxetine hcl]   Social History   Socioeconomic History  . Marital status: Married    Spouse name: Not on file  . Number of children: 1  . Years of education: College  . Highest education level: Not on file  Occupational History  . Occupation: disabled  Social Needs  . Financial resource strain: Not on file  . Food insecurity:     Worry: Not on file    Inability: Not on file  . Transportation needs:    Medical: Not on file    Non-medical: Not on file  Tobacco Use  . Smoking status: Former Smoker    Packs/day: 0.10    Years: 40.00    Pack years: 4.00    Types: Cigarettes    Last attempt to quit: 01/2017    Years since quitting: 1.2  . Smokeless tobacco: Never Used  Substance and Sexual Activity  . Alcohol use: No    Alcohol/week: 0.0 oz  . Drug use: No  . Sexual activity: Never  Lifestyle  . Physical activity:    Days per week: Not on file    Minutes per session: Not on file  . Stress: Not on file  Relationships  . Social connections:    Talks on phone: Not on file    Gets together: Not on file    Attends religious service: Not on file    Active member of club or organization: Not on file    Attends meetings of clubs or organizations: Not on file    Relationship status: Not on file  Other Topics Concern  . Not on file  Social History Narrative   Married, 1 daughter   Disabled   Right-hand   Caffeine: caffeine free sodas     Family History:  The patient's family history includes Diabetes in her daughter; Heart attack (age of onset: 8) in her father; Heart disease in her father; Hyperlipidemia in her father; Hypertension in her daughter and father; Irritable bowel syndrome in her daughter and mother; Stroke in her mother.   ROS:   Please see the history of  present illness.    ROS All other systems reviewed and are negative.   PHYSICAL EXAM:   VS:  BP (!) 100/46   Pulse 62   Ht _0  (1.702 m)   Wt 208 lb 3.2 oz (94.4 kg)   BMI 32.61 kg/m      General: Alert, oriented x3, no distress, mildly obese Head: no evidence of trauma, PERRL, EOMI, no exophtalmos or lid lag, no myxedema, no xanthelasma; normal ears, nose and oropharynx Neck: normal jugular venous pulsations and no hepatojugular reflux; brisk carotid pulses without delay and faint bilateral carotid bruits. Rt CEA scar Chest:  clear to auscultation, no signs of consolidation by percussion or palpation, normal fremitus, symmetrical and full respiratory excursions Cardiovascular: normal position and quality of the apical impulse, regular rhythm, normal first and second heart sounds, no murmurs, rubs or gallops Abdomen: no tenderness or distention, no masses by palpation, no abnormal pulsatility or arterial bruits, normal bowel sounds, no hepatosplenomegaly Extremities:  Bilateral orthoses; no clubbing, cyanosis; 1+ symmetrical ankle edema; 2+ radial, ulnar and brachial pulses bilaterally; 2+ right femoral, 1+ posterior tibial and dorsalis pedis pulses; 2+ left femoral,1+ posterior tibial and dorsalis pedis pulses; no subclavian or femoral bruits Neurological: grossly nonfocal Psych: Normal mood and affect    Wt Readings from Last 3 Encounters:  04/27/18 208 lb 3.2 oz (94.4 kg)  02/16/18 200 lb 4.8 oz (90.9 kg)  01/28/18 200 lb (90.7 kg)      Studies/Labs Reviewed:   EKG:  EKG is ordered today.  It shows NSR, normal tracing QTc 426 ms  Recent Labs: 09/02/2016 Total cholesterol 150, LDL 86, LDL particle #1065, HDL 38, triglyceride 129 Hemoglobin A1c 5.8% Creatinine 1.15, hemoglobin 9.9 with microcytic hypochromic indices, normal TSH September 14 ferritin 18, iron saturation 15%  07/20/2017 and 07/30/2017: BNP 156, A1c 7.2%, Hgb 10.4, AST 55, ALT 111, Bili 0.3, alk phos 49, K 4.6, creat 1.21, glu 79 September 21, 2017 labs showed hemoglobin 10.7, normal liver function tests, creatinine 0.9, NT-proBNP 330 (upper range of normal), hemoglobin A1c 8.1%, mildly reduced magnesium of 1.6 03/18/2017  Chol 112, HDL 48, LDL 48 and LDL-P 859, TG 81, creat 1.1, Hgb A1c 8%. ASSESSMENT:    1. Chronic diastolic heart failure ([place])   2. Cor pulmonale, chronic ([place])   3. Coronary artery disease of native artery of native heart with stable angina pectoris ([place])   4. Essential hypertension, benign   5. PAD (peripheral artery  disease) ([facility])   6. Carotid stenosis, bilateral   7. Insulin dependent diabetes mellitus with complications ([facility])   8. Mixed hyperlipidemia   9. Iron deficiency anemia due to chronic blood loss     PLAN:  In order of problems listed above:  1. CHF: Weight has increased a little, scant edema, no JVD. I think she is close to euvolemia. Suspect dyspnea is mostly due to COPD. 2. RHF/cor pulmonale/PAH: probably the major part of her fluid retention problem. 3. CAD: she may have indeed had angina after her nightmare 6 weeks ago, but no problems since. Continue medical management with beta blocker+amlodipine+nitrates, statin and ASA. She is not a candidate for CABG (due to lung disease and poor functional status) and is a questionable candidate for PCI-stent (due to history of GI bleeding). 4. HTN: BP, especially DBP is low. Will reduce the dose of losartan 5. PAD:  If she were more active, suspect she could have claudication. ABI scheduled in a few days. 6. Carotid stenosis: repeat  Duplex May 3. Asymptomatic. Last study: patent right endarterectomy site and stable mild left-sided obstruction. 7. DM: Glycemic control is imperfect, but not too bad.  8. HLP: excellent parameters on current regimen. 9. Iron deficiency anemia: resolved, last Hgb 11.4. Known to have angiodysplasia of the right colon.would like to avoid more potent antiplatelet therapy if we can (such as would become necessary with a coronary stent).   Medication Adjustments/Labs and Tests Ordered: Current medicines are reviewed at length with the patient today.  Concerns regarding medicines are outlined above.  Medication changes, Labs and Tests ordered today are listed in the Patient Instructions below. Patient Instructions  Medication Instructions:  DECREASE- Losartan 50 mg daily  If you need a refill on your cardiac medications before your next appointment, please call your pharmacy.  Labwork: None Ordered    Testing/Procedures: None Ordered  Follow-Up: Your physician wants you to follow-up in: 6 Months You should receive a reminder letter in the mail two months in advance. If you do not receive a letter, please call our office [phone].     Thank you for choosing [facility]!!          Signed, Sanda Klein, MD  04/27/2018 3:03 PM    [facility] [place], [place], [place]  [address] Phone: [phone]; Fax: [phone]

## 2018-04-30 ENCOUNTER — Ambulatory Visit (INDEPENDENT_AMBULATORY_CARE_PROVIDER_SITE_OTHER)
Admission: RE | Admit: 2018-04-30 | Discharge: 2018-04-30 | Disposition: A | Discharge status: Home / Self Care | Payer: Medicare Other | Source: Ambulatory Visit | Attending: Vascular Surgery | Admitting: Vascular Surgery

## 2018-04-30 ENCOUNTER — Other Ambulatory Visit: Payer: Self-pay

## 2018-04-30 ENCOUNTER — Ambulatory Visit (INDEPENDENT_AMBULATORY_CARE_PROVIDER_SITE_OTHER): Payer: Medicare Other | Admitting: Vascular Surgery

## 2018-04-30 ENCOUNTER — Encounter: Payer: Self-pay | Admitting: Vascular Surgery

## 2018-04-30 ENCOUNTER — Ambulatory Visit (HOSPITAL_COMMUNITY)
Admission: RE | Admit: 2018-04-30 | Discharge: 2018-04-30 | Disposition: A | Discharge status: Home / Self Care | Payer: Medicare Other | Source: Ambulatory Visit | Attending: Vascular Surgery | Admitting: Vascular Surgery

## 2018-04-30 VITALS — BP 102/52 | HR 55 | Resp 18 | Ht 67.0 in | Wt 208.0 lb

## 2018-04-30 DIAGNOSIS — Z794 Long term (current) use of insulin: Secondary | ICD-10-CM | POA: Diagnosis not present

## 2018-04-30 DIAGNOSIS — R9389 Abnormal findings on diagnostic imaging of other specified body structures: Secondary | ICD-10-CM | POA: Insufficient documentation

## 2018-04-30 DIAGNOSIS — I2510 Atherosclerotic heart disease of native coronary artery without angina pectoris: Secondary | ICD-10-CM | POA: Insufficient documentation

## 2018-04-30 DIAGNOSIS — E785 Hyperlipidemia, unspecified: Secondary | ICD-10-CM | POA: Insufficient documentation

## 2018-04-30 DIAGNOSIS — E1151 Type 2 diabetes mellitus with diabetic peripheral angiopathy without gangrene: Secondary | ICD-10-CM | POA: Diagnosis not present

## 2018-04-30 DIAGNOSIS — I739 Peripheral vascular disease, unspecified: Secondary | ICD-10-CM

## 2018-04-30 DIAGNOSIS — I6529 Occlusion and stenosis of unspecified carotid artery: Secondary | ICD-10-CM

## 2018-04-30 DIAGNOSIS — Z87891 Personal history of nicotine dependence: Secondary | ICD-10-CM | POA: Diagnosis not present

## 2018-04-30 DIAGNOSIS — I10 Essential (primary) hypertension: Secondary | ICD-10-CM | POA: Diagnosis not present

## 2018-04-30 DIAGNOSIS — R0989 Other specified symptoms and signs involving the circulatory and respiratory systems: Secondary | ICD-10-CM | POA: Diagnosis present

## 2018-04-30 NOTE — Progress Notes (Signed)
Patient ID: Lauren [name], female   DOB: Jun 25, 1951, 67 y.o.   MRN: [id number]  Reason for Consult: Follow-up (6 month f/u )   Referred by [name], MD  Subjective:     HPI:  Lauren [name] is a 67 y.o. female with history of carotid endarterectomy and at last visit was in a nursing home after having infected hardware he was having rashes to her bilateral lower extremity as well as an indwelling Foley catheter and has a known large ventral hernia.  She is now at home with the help of a caregiver and her husband is also helping her and she is doing much better.  She presents today for evaluation of her carotid arteries as well as ABIs.  She really is free of complaints in her bilateral lower extremities other than foot drop on the left and the need for a wheelchair.  She does not have tissue loss or ulceration.  She has not had a stroke TIA or amaurosis.  Past Medical History:  Diagnosis Date  . Adenomatous colon polyp   . Anemia   . Anxiety   . AVM (arteriovenous malformation)    cecum  . CAD (coronary artery disease)   . Carotid artery occlusion   . Cataract    bil cateracts removed  . COPD (chronic obstructive pulmonary disease) ([place])   . Cough   . DDD (degenerative disc disease), lumbar   . Depression   . Diabetes mellitus   . Elevated LFTs   . Endometrial ca [facility])    endometrial ca dx 4/11  . GERD (gastroesophageal reflux disease)   . Gout 2015  . Hernia of flank   . Hydronephrosis   . Hyperlipidemia   . Hypertension   . Insomnia   . OAB (overactive bladder)   . Osteoarthritis   . Peripheral neuropathy    following chemotherapy  . Psoriasis   . Pulmonary nodule    since 2016  . Radiation 05/01/2011-06/11/11   5600 cGy 28 fxs/periaortic  . Stroke ([place])    tia  . Thyroid nodule   . TIA (transient ischemic attack) 2015  . 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 Uterine cancer ([place])   . Ventral hernia    postoperative since  2009   Family History  Problem Relation Age of Onset  . Stroke Mother   . Irritable bowel syndrome Mother   . Heart attack Father 54       died  . Heart disease Father        Heart Disease before age 19, rheumatic heart disease  . Hyperlipidemia Father   . Hypertension Father   . Diabetes Daughter   . Hypertension Daughter   . Irritable bowel syndrome Daughter   . Colon cancer Neg Hx   . Esophageal cancer Neg Hx   . Pancreatic cancer Neg Hx   . Rectal cancer Neg Hx   . Stomach cancer Neg Hx   . Neuropathy Neg Hx    Past Surgical History:  Procedure Laterality Date  . ABDOMINAL HYSTERECTOMY  2009   for ca  . BACK SURGERY  Aug/sept 2014   3 back surgeries  (Aug 2014 & Sept 2014)  . CHOLECYSTECTOMY     biliteral  . COLONOSCOPY  11/13/2014   negative except for a small cecal AVM and 1 polyp  . ENDARTERECTOMY Right 08/17/2015   Procedure: Right Carotid Endartarectomy with patch Angioplasty ;  Surgeon:  Mal Misty, MD;  Location: [place];  Service: Vascular;  Laterality: Right;  . EYE SURGERY Right Aug. 2013   Cataract, lens implant  . EYE SURGERY Left Sept. 2013   Cataract, lens implant  . I & D of lumbar fusion /removal of hardware for chronic infection  Jan. 8, 2015  . right carotid endarterectomy  2016  . SPINE SURGERY      Short Social History:  Social History   Tobacco Use  . Smoking status: Former Smoker    Packs/day: 0.10    Years: 40.00    Pack years: 4.00    Types: Cigarettes    Last attempt to quit: 01/2017    Years since quitting: 1.2  . Smokeless tobacco: Never Used  Substance Use Topics  . Alcohol use: No    Alcohol/week: 0.0 oz    Allergies  Allergen Reactions  . Ambien [Zolpidem Tartrate] Other (See Comments)    Per the pt, "it makes me go crazy"  . Sulfa Antibiotics Rash  . Elavil [Amitriptyline Hcl] Other (See Comments)    Gastritis  . Prozac [Fluoxetine Hcl] Anxiety and Other (See Comments)    irritability     Current Outpatient  Medications  Medication Sig Dispense Refill  . Albuterol Sulfate (PROAIR RESPICLICK) 338 (90 Base) MCG/ACT AEPB Inhale 2 puffs into the lungs 4 (four) times daily as needed (congestion, cough, wheezing).    Marland Kitchen amLODipine (NORVASC) 5 MG tablet Take 1 tablet (5 mg total) by mouth daily. 30 tablet 0  . antiseptic oral rinse (BIOTENE) LIQD 15 mLs by Mouth Rinse route as needed.     Marland Kitchen aspirin EC 81 MG tablet Take 81 mg by mouth daily.    . Cholecalciferol (VITAMIN D3) 1000 UNITS CAPS Take 1,000 Units by mouth daily.     Marland Kitchen escitalopram (LEXAPRO) 20 MG tablet Take 20 mg by mouth daily.    Marland Kitchen esomeprazole (NEXIUM) 40 MG capsule Take 40 mg by mouth daily.     . ferrous sulfate 325 (65 FE) MG tablet Take 325 mg by mouth daily.     . Fluticasone-Salmeterol (ADVAIR) 250-50 MCG/DOSE AEPB Inhale 1 puff into the lungs 2 (two) times daily as needed (shortness of breath).     . folic acid (FOLVITE) 1 MG tablet Take 1 mg by mouth daily.    . furosemide (LASIX) 40 MG tablet Take 40 mg by mouth daily.     Marland Kitchen gabapentin (NEURONTIN) 600 MG tablet Take 600 mg by mouth 3 (three) times daily.    Marland Kitchen glucose blood (ACCU-CHEK AVIVA PLUS) test strip 1 each by Other route as needed. Use as instructed     . guaiFENesin (MUCINEX) 600 MG 12 hr tablet Take 600 mg by mouth as needed.     . insulin glargine (LANTUS) 100 UNIT/ML injection Inject 70 Units into the skin at bedtime.     . isosorbide mononitrate (IMDUR) 30 MG 24 hr tablet Take 1 tablet (30 mg total) by mouth daily. 30 tablet 4  . losartan (COZAAR) 100 MG tablet Take 50 mg by mouth daily.     . magnesium oxide (MAG-OX) 400 MG tablet Take 400 mg by mouth daily.    . Melatonin 10 MG TABS Take 10 mg by mouth at bedtime.    . metaxalone (SKELAXIN) 800 MG tablet Take 800 mg by mouth 3 (three) times daily. 6am, noon, 6pm    . methotrexate 2.5 MG tablet Take 3 tablets by mouth. Every Wednesday  3  . metoprolol succinate (TOPROL-XL) 50 MG 24 hr tablet Take 50 mg by mouth daily.     . Multiple Vitamin (MULTIVITAMIN WITH MINERALS) TABS tablet Take 1 tablet by mouth daily.    . nitroGLYCERIN (NITROSTAT) 0.4 MG SL tablet Place 1 tablet (0.4 mg total) under the tongue every 5 (five) minutes as needed for chest pain. 25 tablet 3  . nortriptyline (PAMELOR) 10 MG capsule Take 10 mg by mouth at bedtime.     . ondansetron (ZOFRAN-ODT) 4 MG disintegrating tablet Take 4 mg by mouth every 8 (eight) hours as needed for nausea or vomiting.     . OXYCONTIN 15 MG 12 hr tablet Take 1 tablet (15 mg total) by mouth every 12 (twelve) hours. 4 tablet 0  . potassium chloride (MICRO-K) 10 MEQ CR capsule Take 1 capsule by mouth 2 (two) times daily.  3  . rosuvastatin (CRESTOR) 5 MG tablet Take 5 mg by mouth at bedtime.    . senna (SENOKOT) 8.6 MG tablet Take 1 tablet by mouth daily.    . tapentadol (NUCYNTA) 50 MG tablet Take 1 tablet (50 mg total) by mouth every 6 (six) hours. 6 am, noon, 6pm  And midnight 8 tablet 0  . vitamin B-12 (CYANOCOBALAMIN) 1000 MCG tablet Take 1,000 mcg by mouth daily.     No current facility-administered medications for this visit.     Review of Systems  Constitutional:  Constitutional negative. HENT: HENT negative.  Eyes: Eyes negative.  Respiratory: Positive for shortness of breath.  Cardiovascular: Positive for chest pain, chest tightness and leg swelling.  Musculoskeletal: Positive for gait problem, leg pain and joint pain.  Skin: Negative for rash and wound.  Neurological: Positive for focal weakness and numbness.       Left foot drop Hematologic: Hematologic/lymphatic negative.        Objective:  Objective   Vitals:   04/30/18 1435 04/30/18 1436  BP: (!) 92/50 (!) 102/52  Pulse: (!) 55   Resp: 18   SpO2: 95%   Weight: 208 lb (94.3 kg)   Height: 5\' 7"  (1.702 m)    Body mass index is 32.58 kg/m.  Physical Exam  Constitutional: She is oriented to person, place, and time. She appears well-developed.  HENT:  Head: Normocephalic.  Eyes:  Pupils are equal, round, and reactive to light.  Neck: Normal range of motion.  Cardiovascular: Normal rate.  Pulses:      Femoral pulses are 0 on the right side, and 0 on the left side.      Popliteal pulses are 0 on the right side, and 0 on the left side.  Pulmonary/Chest: Effort normal.  Abdominal: Soft. She exhibits no mass.  Musculoskeletal: She exhibits no edema.  Neurological: She is alert and oriented to person, place, and time.  Skin: Skin is warm and dry.  Psychiatric: She has a normal mood and affect. Her behavior is normal. Thought content normal.    Data: I have reviewed her carotid artery duplex images which demonstrate right-sided carotid endarterectomy to be patent and left-sided 1 to 39% stenosis in the native artery.    ABIs are 0.71 right and 0.57 left     Assessment/Plan:     67 year old female presents for evaluation of her carotid arteries as well as bilateral lower extremities with decreased ABIs and known aorto iliac stenosis from previous CT scanning.  At this time she does not have palpable femoral pulses and this is consistent with her previous  exam but she does not have any symptoms and does not want to pursue any intervention at this time.  She also has a patent carotid endarterectomy which is unchanged.  From that standpoint she will continue aspirin and we will see her in 2 years.  If she has issues with her legs or any symptoms from her carotid disease we will certainly see her sooner.     [name] MD [facility]

## 2018-05-05 ENCOUNTER — Encounter (INDEPENDENT_AMBULATORY_CARE_PROVIDER_SITE_OTHER): Payer: Self-pay | Admitting: Physical Medicine and Rehabilitation

## 2018-05-05 ENCOUNTER — Ambulatory Visit (INDEPENDENT_AMBULATORY_CARE_PROVIDER_SITE_OTHER): Payer: Medicare Other | Admitting: Physical Medicine and Rehabilitation

## 2018-05-05 VITALS — BP 135/63 | HR 65 | Temp 98.4°F

## 2018-05-05 DIAGNOSIS — M961 Postlaminectomy syndrome, not elsewhere classified: Secondary | ICD-10-CM | POA: Diagnosis not present

## 2018-05-05 DIAGNOSIS — M48062 Spinal stenosis, lumbar region with neurogenic claudication: Secondary | ICD-10-CM | POA: Diagnosis not present

## 2018-05-05 DIAGNOSIS — G6181 Chronic inflammatory demyelinating polyneuritis: Secondary | ICD-10-CM

## 2018-05-05 DIAGNOSIS — F1120 Opioid dependence, uncomplicated: Secondary | ICD-10-CM

## 2018-05-05 DIAGNOSIS — M21372 Foot drop, left foot: Secondary | ICD-10-CM

## 2018-05-05 DIAGNOSIS — R29898 Other symptoms and signs involving the musculoskeletal system: Secondary | ICD-10-CM | POA: Diagnosis not present

## 2018-05-05 DIAGNOSIS — I6523 Occlusion and stenosis of bilateral carotid arteries: Secondary | ICD-10-CM | POA: Diagnosis not present

## 2018-05-05 DIAGNOSIS — G6289 Other specified polyneuropathies: Secondary | ICD-10-CM | POA: Diagnosis not present

## 2018-05-05 DIAGNOSIS — G959 Disease of spinal cord, unspecified: Secondary | ICD-10-CM

## 2018-05-05 NOTE — Progress Notes (Signed)
   Numeric Pain Rating Scale and Functional Assessment Average Pain 8 Pain Right Now 6 My pain is constant, sharp, tingling and aching Pain is worse with: some activites Pain improves with: rest   In the last MONTH (on 0-10 scale) has pain interfered with the following?  1. General activity like being  able to carry out your everyday physical activities such as walking, climbing stairs, carrying groceries, or moving a chair?  Rating(10)  2. Relation with others like being able to carry out your usual social activities and roles such as  activities at home, at work and in your community. Rating(0)  3. Enjoyment of life such that you have  been bothered by emotional problems such as feeling anxious, depressed or irritable?  Rating(4)

## 2018-05-06 ENCOUNTER — Encounter (INDEPENDENT_AMBULATORY_CARE_PROVIDER_SITE_OTHER): Payer: Self-pay | Admitting: Physical Medicine and Rehabilitation

## 2018-05-06 NOTE — Progress Notes (Signed)
Lauren [name] - 67 y.o. female MRN [id number]  Date of birth: 1951/04/20  Office Visit Note: Visit Date: 05/05/2018 PCP: [name], MD Referred by: [name], MD  Subjective: Chief Complaint  Patient presents with  . Lower Back - Pain  . Right Leg - Pain, Weakness, Numbness  . Left Leg - Pain, Weakness, Numbness  . Left Ankle - Weakness, Other  . Right Foot - Weakness, Numbness, Other  . Left Foot - Weakness, Numbness, Other   HPI: Lauren [name] is a 67 year old female that comes in today at the consultation request of Dr. [name] for evaluation and possible management of bilateral Achilles flexor contracture.  She is present today with her husband who provides a lot of the history.  She has a very complicated medical history but relevant history as she has had at least 5 back surgeries and from the back surgeries she has had a chronic bilateral foot drop.  The foot drop is been left more than the right.  It is been somewhat hard to evaluate the foot drop because she does have Achilles plantar flexion contractures.  Per the notes and the patient's reports the surgeries were performed by Dr. Rennis Harding and ultimately she was referred to [facility] at [facility] for chronic pain management.  She is followed by Dr. Wylene Men.  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  She has had electrodiagnostic studies.  She has been having physical therapy at home which has been basically strengthening exercises and transferring.  She was recently seen in February by Dr. Erlinda Hong in our office for similar complaint of the lower extremity contraction and foot drop.  He ordered a brace that was manufactured by [employer].  This brace was present today and being used.  He has double metal upright in a built up heel lift to compensate for the contracture.  Patient reports not really liking to wear it very  much but she tries to wear it when she is out.  She does not have a brace on the right foot.  She is in a wheelchair today and is not ambulatory.  She does stand and do exercises at home when she can and she can stand for about 5 minutes.  Physical therapy is not specifically work on the contracture at home although they do some range of motion and when caregiver does seem to try to help with the feet.  Her husband comments on difficulty with some transfers if she is not wearing the shoe because the left foot will roll over.  Dr. Sandi Mariscal asked me specifically to look at her  Achilles for flexor contracture.  Interestingly she is seeing Dr. Sharol Given who is our foot and ankle expert in the office but she has seen him for her shoulders.  She does endorse worsening shoulder pain particularly on the left.  She thinks this is from using the walker and trying to transfer and putting a lot of weight on her arms.  Lastly her husband indicates that he had spoken with Dr. Sandi Mariscal about the possibility of [place] taking over her pain medications because the and had a hard time going to [facility] to have these refilled.  She has been maintained on OxyContin as well as Nucynta and nortriptyline.  She is in comprehensive pain management at [facility] with Dr. Mechele Dawley.  I reviewed their notes.  She  has been following her protocol with appropriate urine testing and monitoring.  She reports that she has had times given the medical conditions where she was very depressed but is been doing much better recently and has progressed quite a bit.  She would love to be able to move out of the chair better than she does.   Review of Systems  Constitutional: Positive for malaise/fatigue. Negative for chills, fever and weight loss.  HENT: Negative for hearing loss and sinus pain.   Eyes: Negative for blurred vision, double vision and photophobia.  Respiratory: Negative for cough and shortness of breath.   Cardiovascular: Positive for leg  swelling. Negative for chest pain and palpitations.  Gastrointestinal: Negative for abdominal pain, nausea and vomiting.  Genitourinary: Negative for flank pain.  Musculoskeletal: Positive for back pain and joint pain. Negative for myalgias.  Skin: Negative for itching and rash.  Neurological: Positive for tingling and focal weakness. Negative for tremors.  Endo/Heme/Allergies: Negative.   Psychiatric/Behavioral: Negative for depression.  All other systems reviewed and are negative.  Otherwise per HPI.  Assessment & Plan: Visit Diagnoses:  1. Left foot drop   2. Post laminectomy syndrome   3. Axonal sensorimotor neuropathy   4. CIDP (chronic inflammatory demyelinating polyneuropathy) (HCC)   5. Spinal stenosis of lumbar region with neurogenic claudication   6. Myelopathy ([place])   7. Weakness of both lower extremities   8. Narcotic dependence ([place])     Plan: Findings:  Very medically and neurologically complex chronic pain patient with bilateral foot drop and bilateral flexion contractures of the Achilles.  On exam it really is impossible to place to the feet in dorsiflexion at all.  With weightbearing she does seem to get some dorsiflexion but it is mild.  On the right she cannot raise the big toe she has EHL weakness so clearly I think she has foot drop on both sides with the left lower extremity being more flaccid.  She has difficulty with moving the right leg in general but can bear weight.  She represents a really difficult case and I am not sure on the expert on Achilles tendon flexor contractures although we used to see them quite a bit with brain injury patients.  My first thought is given the fact that she is having worsening shoulder pain and has seen Dr. Sharol Given in the past but I want her to follow-up with him and discuss the situation with him and get his opinion.  My second thought would be something like a percutaneous ultrasound-guided tenotomy which I know is been used for other  tendons.  We are not doing that in the office as I have not really done a lot with ultrasound.  I will send a message to Dr. Teresa Coombs who does use the ultrasound-guided procedures and see if this is something he would look at or felt like he could look at.  I would be a percutaneous measure of lengthening the tendon potentially.  I have encouraged her during the day to stand multiple times during the day as this may benefit over time to a small degree increase dorsiflexion.  I think that have to measure this in millimeters and months as it would be a slow process.  I also think that she should continue to wear the double metal upright AFO on the left as it safer for her transfer.  They did compensate for the flexion contracture.  There may be some role for a boot which could be  adjusted in degrees of dorsiflexion.  Again I would like to see Dr. Gavin Potters recommendation and I think that would be the best approach. Appointment was made today.  Lastly in terms of pain medication unfortunately we are not able to offer chronic opioid management in the office.  We do use some level of opioid management in the short-term with patients going through different problems and pain complaints.  Given her current and complex situation I think she is well placed in a comprehensive pain management program such as [facility].  However she does not like traveling to [facility] in a bigger hospital system is as always hassle.  I have given them names of people in town but they can asked Dr. Sandi Mariscal for referral.  One name that comes to mind is Dr. Clydell Hakim.  He is an anesthesiologist and pain medicine expert at [place] and trained at the [facility].  He does have the facility there to perform more of a chronic medication management.  Other options would be [facility] pain [facility] or [facility] pain [facility] with Dr. Nicholaus Bloom.    Meds & Orders: No orders of  the defined types were placed in this encounter.  No orders of the defined types were placed in this encounter.   Follow-up: Return if symptoms worsen or fail to improve.   Procedures: No procedures performed  No notes on file   Clinical History: No specialty comments available.   She reports that she quit smoking about 15 months ago. Her smoking use included cigarettes. She has a 4.00 pack-year smoking history. She has never used smokeless tobacco.  Recent Labs    08/22/17 0358  HGBA1C 5.9*    Objective:  VS:  HT:    WT:   BMI:     BP:135/63  HR:65bpm  TEMP:98.4 F (36.9 C)(Oral)  RESP:94 % Physical Exam  Constitutional: She is oriented to person, place, and time. She appears well-developed and well-nourished. No distress.  obese  HENT:  Head: Normocephalic and atraumatic.  Nose: Nose normal.  Mouth/Throat: Oropharynx is clear and moist.  Eyes: Pupils are equal, round, and reactive to light. Conjunctivae and EOM are normal.  Neck: Normal range of motion. Neck supple. No tracheal deviation present.  Cardiovascular: Normal rate and regular rhythm.  Absent distal pulses bilaterally  Pulmonary/Chest: Effort normal. No respiratory distress.  Abdominal: Soft. She exhibits no distension. There is no guarding.  Musculoskeletal: She exhibits edema.  Patient is very slow to rise from a seated position and does have pain upon extension of the lower back.  When she does stand she gets some increase in dorsiflexion with weightbearing but she compensates with knee bend.  Examination of both lower extremity shows some swelling on the right lower extremity compared to left.  There is no signs of allodynia or trophic changes.  There is considerable atrophy of the musculature on the left compared to right.  There is as noted below EHL weakness and dorsiflexion weakness bilaterally.  The patient can plantarflex.  She does have contractures of both ankles into plantarflexion.  Very hard  manually to change this angle.  Neurological: She is alert and oriented to person, place, and time. She displays abnormal reflex. A sensory deficit is present. She exhibits abnormal muscle tone.  Neurologic testing of the lower extremities shows good strength in the proximal muscles with 0 out of 5 dorsiflexion and EHL bilaterally.  She has some strength loss with  knee flexion as well.  She has decreased sensation to light touch and mostly in L5 dermatome but somewhat in a stocking distribution.  Skin: Skin is warm and dry. No rash noted. No erythema.  Psychiatric: She has a normal mood and affect. Her behavior is normal.  Nursing note and vitals reviewed.   Ortho Exam Imaging: No results found.  Past Medical/Family/Surgical/Social History: Medications & Allergies reviewed per EMR, new medications updated. Patient Active Problem List   Diagnosis Date Noted  . Foot drop, left 02/11/2018  . DOE (dyspnea on exertion) 01/28/2018  . Insulin dependent diabetes mellitus with complications ([place]) 80/99/8338  . Diastolic dysfunction 25/04/3975  . Abnormal liver function tests 11/14/2017  . CIDP (chronic inflammatory demyelinating polyneuropathy) ([place]) 09/09/2017  . Acute encephalopathy 08/22/2017  . Acute urinary retention 08/22/2017  . AKI (acute kidney injury) ([place]) 08/22/2017  . PAD (peripheral artery disease) ([place]) 08/15/2017  . Cor pulmonale, chronic ([place]) 08/15/2017  . Chronic diastolic heart failure ([place]) 08/15/2017  . History of cigarette smoking 08/15/2017  . Multiple pulmonary nodules determined by computed tomography of lung 07/31/2017  . 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 Pressure ulcer of left heel, stage 2 02/04/2017  . Altered mental status   . Community acquired pneumonia   . Encephalopathy   . Fall   . Weakness of both lower extremities   . Chronic pain   . Narcotic dependence ([place])   . Incontinence of feces   . Sepsis ([place]) 02/02/2017  . Carotid stenosis,  bilateral 01/30/2017  . Aftercare following surgery of the circulatory system 01/30/2017  . Impingement syndrome of left shoulder 12/17/2016  . Coronary artery disease of native artery of native heart with stable angina pectoris ([place]) 10/07/2016  . Iron deficiency anemia 10/07/2016  . DDD (degenerative disc disease), lumbar 10/03/2016  . Fatigue 10/03/2016  . Intervertebral disc disease 10/03/2016  . Low back pain 10/03/2016  . Myelopathy ([place]) 10/03/2016  . Prolapsed lumbosacral intervertebral disc 10/03/2016  . Spondylisthesis 10/03/2016  . MRSA (methicillin resistant staph aureus) culture positive 10/10/2015  . Carotid stenosis 08/17/2015  . Preoperative cardiovascular examination 08/10/2015  . Tobacco abuse 08/10/2015  . 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 Acute osteomyelitis of spine ([place]) 08/28/2014  . Difficult or painful urination 08/28/2014  . H/O Spinal surgery 08/28/2014  . Elevated WBC count 08/28/2014  . Encounter for aftercare for long-term (current) use of antibiotics 08/28/2014  . N&V (nausea and vomiting) 08/28/2014  . Bladder retention 08/28/2014  . Infection of urinary tract 08/28/2014  . Ventral hernia 06/15/2013  . COPD GOLD III/IV 06/15/2013  . DJD (degenerative joint disease) of lumbar spine 06/15/2013  . Depression 06/15/2013  . Exertional angina ([place]) 06/15/2013  . Preoperative evaluation 06/15/2013  . Occlusion and stenosis of carotid artery without mention of cerebral infarction 03/02/2012  . Malignant neoplasm of corpus uteri, except isthmus ([place]) 01/19/2012  . Abnormal EKG 12/07/2011  . Essential hypertension, benign 12/07/2011  . Mixed hyperlipidemia 12/07/2011  . Carotid artery disease without cerebral infarction ([place]) 12/07/2011   Past Medical History:  Diagnosis Date  . Adenomatous colon polyp   . Anemia   . Anxiety   . AVM (arteriovenous malformation)    cecum  . CAD (coronary artery disease)   . Carotid artery occlusion   . Cataract     bil cateracts removed  . COPD (chronic obstructive pulmonary disease) ([place])   . Cough   . DDD (degenerative disc disease), lumbar   . Depression   .  Diabetes mellitus   . Elevated LFTs   . Endometrial ca [facility])    endometrial ca dx 4/11  . GERD (gastroesophageal reflux disease)   . Gout 2015  . Hernia of flank   . Hydronephrosis   . Hyperlipidemia   . Hypertension   . Insomnia   . OAB (overactive bladder)   . Osteoarthritis   . Peripheral neuropathy    following chemotherapy  . Psoriasis   . Pulmonary nodule    since 2016  . Radiation 05/01/2011-06/11/11   5600 cGy 28 fxs/periaortic  . Stroke ([place])    tia  . Thyroid nodule   . TIA (transient ischemic attack) 2015  . 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 Uterine cancer ([place])   . Ventral hernia    postoperative since 2009   Family History  Problem Relation Age of Onset  . Stroke Mother   . Irritable bowel syndrome Mother   . Heart attack Father 65       died  . Heart disease Father        Heart Disease before age 42, rheumatic heart disease  . Hyperlipidemia Father   . Hypertension Father   . Diabetes Daughter   . Hypertension Daughter   . Irritable bowel syndrome Daughter   . Colon cancer Neg Hx   . Esophageal cancer Neg Hx   . Pancreatic cancer Neg Hx   . Rectal cancer Neg Hx   . Stomach cancer Neg Hx   . Neuropathy Neg Hx    Past Surgical History:  Procedure Laterality Date  . ABDOMINAL HYSTERECTOMY  2009   for ca  . BACK SURGERY  Aug/sept 2014   3 back surgeries  (Aug 2014 & Sept 2014)  . CHOLECYSTECTOMY     biliteral  . COLONOSCOPY  11/13/2014   negative except for a small cecal AVM and 1 polyp  . ENDARTERECTOMY Right 08/17/2015   Procedure: Right Carotid Endartarectomy with patch Angioplasty ;  Surgeon: Mal Misty, MD;  Location: [facility];  Service: Vascular;  Laterality: Right;  . EYE SURGERY Right Aug. 2013   Cataract, lens implant  . EYE SURGERY Left Sept. 2013    Cataract, lens implant  . I & D of lumbar fusion /removal of hardware for chronic infection  Jan. 8, 2015  . right carotid endarterectomy  2016  . SPINE SURGERY     Social History   Occupational History  . Occupation: disabled  Tobacco Use  . Smoking status: Former Smoker    Packs/day: 0.10    Years: 40.00    Pack years: 4.00    Types: Cigarettes    Last attempt to quit: 01/2017    Years since quitting: 1.2  . Smokeless tobacco: Never Used  Substance and Sexual Activity  . Alcohol use: No    Alcohol/week: 0.0 oz  . Drug use: No  . Sexual activity: Never

## 2018-05-10 ENCOUNTER — Ambulatory Visit: Payer: Medicare Other | Admitting: Cardiovascular Disease

## 2018-05-17 ENCOUNTER — Ambulatory Visit (INDEPENDENT_AMBULATORY_CARE_PROVIDER_SITE_OTHER): Payer: Medicare Other | Admitting: Orthopedic Surgery

## 2018-05-17 ENCOUNTER — Encounter (INDEPENDENT_AMBULATORY_CARE_PROVIDER_SITE_OTHER): Payer: Self-pay | Admitting: Orthopedic Surgery

## 2018-05-17 DIAGNOSIS — M21371 Foot drop, right foot: Secondary | ICD-10-CM

## 2018-05-17 DIAGNOSIS — I6523 Occlusion and stenosis of bilateral carotid arteries: Secondary | ICD-10-CM

## 2018-05-17 DIAGNOSIS — M21372 Foot drop, left foot: Secondary | ICD-10-CM

## 2018-05-17 DIAGNOSIS — E1351 Other specified diabetes mellitus with diabetic peripheral angiopathy without gangrene: Secondary | ICD-10-CM | POA: Diagnosis not present

## 2018-05-17 NOTE — Progress Notes (Signed)
Office Visit Note   Patient: Lauren [name]           Date of Birth: 05-05-1951           MRN: [id number] Visit Date: 05/17/2018              Requested by: [name], MD [address] [place], [place] [address] PCP: [name], MD  Chief Complaint  Patient presents with  . 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  Patient states that she has had 3 back surgeries she states that the foot drop developed after her surgery.  She states that her last back surgery there was removal of hardware due to infection and patient states that repeat back surgery is not recommended.  Patient has seen vascular vein surgery in also has significant peripheral vascular disease.  Patient states that she has a chronic history of tobacco use but has not smoked in a year.  Assessment & Plan: Visit Diagnoses:  1. Peripheral vascular disease due to secondary diabetes mellitus ([place])   2. Foot drop, bilateral     Plan: I discussed with the patient that we could proceed with a tibial calcaneal fusion to give her a plantigrade foot on the left that she should be able to walk on without bracing.  Discussed that with her peripheral vascular disease she is a high risk of the incision not healing or slow healing of the bone and that a potential complication would be a transtibial amputation.  Patient states that she cannot go on living with the equinus contracture on her left foot.  Recommend she follow-up with Dr.Cain for evaluation to see if any type of endovascular procedure is an option to improve circulation to the left foot.  Discussed that after vascular evaluation we could proceed with surgery with her understanding of the risks.  Follow-Up Instructions: Return if symptoms worsen or fail to improve.   Ortho Exam  Patient is alert, oriented, no adenopathy, well-dressed, normal affect,  normal respiratory effort. Examination patient does not have a palpable pulse on both lower extremities.  With a Doppler she has a monophasic posterior tibial pulse and a weak dorsalis pedis pulse.  Her foot is warm she does have venous swelling in the right leg worse than the left.  She has good hip flexion knee flexion and extension strength she has an equinus contracture of the left foot with approximately 40 degrees equinovarus contracture.  She has plantar flexion strength on the right but no dorsiflexion strength on the right.  Her most recent hemoglobin A1c shows good control at 5.9.  Review of the patient's ankle-brachial indices shows circulation in both lower extremities between 50 and 60%.  Imaging: No results found. No images are attached to the encounter.  Labs: Lab Results  Component Value Date   HGBA1C 5.9 (H) 08/22/2017   HGBA1C 6.9 (H) 08/17/2015   HGBA1C 7.0 (H) 08/14/2015   ESRSEDRATE 36 03/10/2017   REPTSTATUS 08/23/2017 FINAL 08/21/2017   GRAMSTAIN No WBC Seen 04/16/2017   GRAMSTAIN No Organisms Seen 04/16/2017   GRAMSTAIN CYTOSPIN 04/16/2017   CULT  08/21/2017    NO GROWTH Performed at [facility], [facility] [address] [place], [place] [address]    LABORGA NO GROWTH 04/16/2017     Lab Results  Component Value Date   ALBUMIN  3.7 10/13/2017   ALBUMIN 2.9 (L) 08/22/2017   ALBUMIN 3.5 08/21/2017    There is no height or weight on file to calculate BMI.  Orders:  No orders of the defined types were placed in this encounter.  No orders of the defined types were placed in this encounter.    Procedures: No procedures performed  Clinical Data: No additional findings.  ROS:  All other systems negative, except as noted in the HPI. Review of Systems  Objective: Vital Signs: There were no vitals taken for this visit.  Specialty Comments:  No specialty comments available.  PMFS History: Patient Active Problem List   Diagnosis Date Noted  .  Peripheral vascular disease due to secondary diabetes mellitus ([place]) 05/17/2018  . Foot drop, bilateral 05/17/2018  . Foot drop, left 02/11/2018  . DOE (dyspnea on exertion) 01/28/2018  . Insulin dependent diabetes mellitus with complications ([place]) 38/18/2993  . Diastolic dysfunction 71/69/6789  . Abnormal liver function tests 11/14/2017  . CIDP (chronic inflammatory demyelinating polyneuropathy) ([place]) 09/09/2017  . Acute encephalopathy 08/22/2017  . Acute urinary retention 08/22/2017  . AKI (acute kidney injury) ([place]) 08/22/2017  . PAD (peripheral artery disease) ([place]) 08/15/2017  . Cor pulmonale, chronic ([place]) 08/15/2017  . Chronic diastolic heart failure ([place]) 08/15/2017  . History of cigarette smoking 08/15/2017  . Multiple pulmonary nodules determined by computed tomography of lung 07/31/2017  . 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 Pressure ulcer of left heel, stage 2 02/04/2017  . Altered mental status   . Community acquired pneumonia   . Encephalopathy   . Fall   . Weakness of both lower extremities   . Chronic pain   . Narcotic dependence ([place])   . Incontinence of feces   . Sepsis ([place]) 02/02/2017  . Carotid stenosis, bilateral 01/30/2017  . Aftercare following surgery of the circulatory system 01/30/2017  . Impingement syndrome of left shoulder 12/17/2016  . Coronary artery disease of native artery of native heart with stable angina pectoris ([place]) 10/07/2016  . Iron deficiency anemia 10/07/2016  . DDD (degenerative disc disease), lumbar 10/03/2016  . Fatigue 10/03/2016  . Intervertebral disc disease 10/03/2016  . Low back pain 10/03/2016  . Myelopathy ([place]) 10/03/2016  . Prolapsed lumbosacral intervertebral disc 10/03/2016  . Spondylisthesis 10/03/2016  . MRSA (methicillin resistant staph aureus) culture positive 10/10/2015  . Carotid stenosis 08/17/2015  . Preoperative cardiovascular examination 08/10/2015  . Tobacco abuse 08/10/2015  . Cecal  angiodysplasia 11/13/2014  . Acute osteomyelitis of spine ([place]) 08/28/2014  . Difficult or painful urination 08/28/2014  . H/O Spinal surgery 08/28/2014  . Elevated WBC count 08/28/2014  . Encounter for aftercare for long-term (current) use of antibiotics 08/28/2014  . N&V (nausea and vomiting) 08/28/2014  . Bladder retention 08/28/2014  . Infection of urinary tract 08/28/2014  . Ventral hernia 06/15/2013  . COPD GOLD III/IV 06/15/2013  . DJD (degenerative joint disease) of lumbar spine 06/15/2013  . Depression 06/15/2013  . Exertional angina ([place]) 06/15/2013  . Preoperative evaluation 06/15/2013  . Occlusion and stenosis of carotid artery without mention of cerebral infarction 03/02/2012  . Malignant neoplasm of corpus uteri, except isthmus ([place]) 01/19/2012  . Abnormal EKG 12/07/2011  . Essential hypertension, benign 12/07/2011  . Mixed hyperlipidemia 12/07/2011  . Carotid artery disease without cerebral infarction ([place]) 12/07/2011   Past Medical History:  Diagnosis Date  . Adenomatous colon polyp   . Anemia   . Anxiety   . AVM (arteriovenous malformation)    cecum  .  CAD (coronary artery disease)   . Carotid artery occlusion   . Cataract    bil cateracts removed  . COPD (chronic obstructive pulmonary disease) ([place])   . Cough   . DDD (degenerative disc disease), lumbar   . Depression   . Diabetes mellitus   . Elevated LFTs   . Endometrial ca [facility])    endometrial ca dx 4/11  . GERD (gastroesophageal reflux disease)   . Gout 2015  . Hernia of flank   . Hydronephrosis   . Hyperlipidemia   . Hypertension   . Insomnia   . OAB (overactive bladder)   . Osteoarthritis   . Peripheral neuropathy    following chemotherapy  . Psoriasis   . Pulmonary nodule    since 2016  . Radiation 05/01/2011-06/11/11   5600 cGy 28 fxs/periaortic  . Stroke ([place])    tia  . Thyroid nodule   . TIA (transient ischemic attack) 2015  . 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 Uterine cancer ([place])   . Ventral hernia    postoperative since 2009    Family History  Problem Relation Age of Onset  . Stroke Mother   . Irritable bowel syndrome Mother   . Heart attack Father 74       died  . Heart disease Father        Heart Disease before age 11, rheumatic heart disease  . Hyperlipidemia Father   . Hypertension Father   . Diabetes Daughter   . Hypertension Daughter   . Irritable bowel syndrome Daughter   . Colon cancer Neg Hx   . Esophageal cancer Neg Hx   . Pancreatic cancer Neg Hx   . Rectal cancer Neg Hx   . Stomach cancer Neg Hx   . Neuropathy Neg Hx     Past Surgical History:  Procedure Laterality Date  . ABDOMINAL HYSTERECTOMY  2009   for ca  . BACK SURGERY  Aug/sept 2014   3 back surgeries  (Aug 2014 & Sept 2014)  . CHOLECYSTECTOMY     biliteral  . COLONOSCOPY  11/13/2014   negative except for a small cecal AVM and 1 polyp  . ENDARTERECTOMY Right 08/17/2015   Procedure: Right Carotid Endartarectomy with patch Angioplasty ;  Surgeon: Mal Misty, MD;  Location: [facility];  Service: Vascular;  Laterality: Right;  . EYE SURGERY Right Aug. 2013   Cataract, lens implant  . EYE SURGERY Left Sept. 2013   Cataract, lens implant  . I & D of lumbar fusion /removal of hardware for chronic infection  Jan. 8, 2015  . right carotid endarterectomy  2016  . SPINE SURGERY     Social History   Occupational History  . Occupation: disabled  Tobacco Use  . Smoking status: Former Smoker    Packs/day: 0.10    Years: 40.00    Pack years: 4.00    Types: Cigarettes    Last attempt to quit: 01/2017    Years since quitting: 1.2  . Smokeless tobacco: Never Used  Substance and Sexual Activity  . Alcohol use: No    Alcohol/week: 0.0 oz  . Drug use: No  . Sexual activity: Never

## 2018-05-20 ENCOUNTER — Encounter: Payer: Self-pay | Admitting: Vascular Surgery

## 2018-06-16 ENCOUNTER — Ambulatory Visit (INDEPENDENT_AMBULATORY_CARE_PROVIDER_SITE_OTHER): Payer: Medicare Other | Admitting: Podiatry

## 2018-06-16 ENCOUNTER — Encounter: Payer: Self-pay | Admitting: Podiatry

## 2018-06-16 DIAGNOSIS — L02619 Cutaneous abscess of unspecified foot: Secondary | ICD-10-CM | POA: Diagnosis not present

## 2018-06-16 DIAGNOSIS — L84 Corns and callosities: Secondary | ICD-10-CM

## 2018-06-16 DIAGNOSIS — L03119 Cellulitis of unspecified part of limb: Secondary | ICD-10-CM

## 2018-06-16 DIAGNOSIS — I6523 Occlusion and stenosis of bilateral carotid arteries: Secondary | ICD-10-CM

## 2018-06-16 DIAGNOSIS — E1142 Type 2 diabetes mellitus with diabetic polyneuropathy: Secondary | ICD-10-CM

## 2018-06-16 NOTE — Progress Notes (Signed)
Subjective:   Patient ID: Lauren [name], female   DOB: 67 y.o.   MRN: [id number]   HPI Patient presents stating that she has fluid buildup around her right heel and she is mostly in bed or chair as she is nonambulatory currently   ROS      Objective:  Physical Exam  Neurovascular status is unchanged with patient found to have a large accumulation of fluid with abscess of the plantar heel right with no proximal edema erythema or no systemic signs of infection currently     Assessment:  Abscess of the plantar aspect right heel which has been present for fairly long time     Plan:  Today I went ahead using sterile instrumentation I did a opening of this area and flushed fluid out which was clear indicating it is more abscess and it does not appear to be infected.  I applied sterile dressing and instructed on continued reduced activity and I did debride nailbeds 1-5 both feet that were painful thick and she cannot cut herself

## 2018-06-22 ENCOUNTER — Other Ambulatory Visit: Payer: Self-pay | Admitting: Vascular Surgery

## 2018-06-22 ENCOUNTER — Other Ambulatory Visit: Payer: Self-pay | Admitting: Family Medicine

## 2018-06-22 ENCOUNTER — Encounter: Payer: Self-pay | Admitting: Vascular Surgery

## 2018-06-22 DIAGNOSIS — Z1231 Encounter for screening mammogram for malignant neoplasm of breast: Secondary | ICD-10-CM

## 2018-06-28 ENCOUNTER — Ambulatory Visit (HOSPITAL_COMMUNITY)
Admission: RE | Admit: 2018-06-28 | Discharge: 2018-06-28 | Disposition: A | Discharge status: Home / Self Care | Payer: Medicare Other | Source: Ambulatory Visit | Attending: Vascular Surgery | Admitting: Vascular Surgery

## 2018-06-28 ENCOUNTER — Encounter (HOSPITAL_COMMUNITY)
Admission: RE | Disposition: A | Discharge status: Home / Self Care | Payer: Self-pay | Source: Ambulatory Visit | Attending: Vascular Surgery

## 2018-06-28 DIAGNOSIS — L409 Psoriasis, unspecified: Secondary | ICD-10-CM | POA: Insufficient documentation

## 2018-06-28 DIAGNOSIS — I6529 Occlusion and stenosis of unspecified carotid artery: Secondary | ICD-10-CM | POA: Insufficient documentation

## 2018-06-28 DIAGNOSIS — I70203 Unspecified atherosclerosis of native arteries of extremities, bilateral legs: Secondary | ICD-10-CM | POA: Insufficient documentation

## 2018-06-28 DIAGNOSIS — Z794 Long term (current) use of insulin: Secondary | ICD-10-CM | POA: Insufficient documentation

## 2018-06-28 DIAGNOSIS — Z923 Personal history of irradiation: Secondary | ICD-10-CM | POA: Insufficient documentation

## 2018-06-28 DIAGNOSIS — Z90710 Acquired absence of both cervix and uterus: Secondary | ICD-10-CM | POA: Diagnosis not present

## 2018-06-28 DIAGNOSIS — I10 Essential (primary) hypertension: Secondary | ICD-10-CM | POA: Diagnosis not present

## 2018-06-28 DIAGNOSIS — I739 Peripheral vascular disease, unspecified: Secondary | ICD-10-CM

## 2018-06-28 DIAGNOSIS — Z800 Family history of malignant neoplasm of digestive organs: Secondary | ICD-10-CM | POA: Insufficient documentation

## 2018-06-28 DIAGNOSIS — M109 Gout, unspecified: Secondary | ICD-10-CM | POA: Insufficient documentation

## 2018-06-28 DIAGNOSIS — Z86010 Personal history of colonic polyps: Secondary | ICD-10-CM | POA: Insufficient documentation

## 2018-06-28 DIAGNOSIS — K219 Gastro-esophageal reflux disease without esophagitis: Secondary | ICD-10-CM | POA: Insufficient documentation

## 2018-06-28 DIAGNOSIS — M5136 Other intervertebral disc degeneration, lumbar region: Secondary | ICD-10-CM | POA: Insufficient documentation

## 2018-06-28 DIAGNOSIS — Z8673 Personal history of transient ischemic attack (TIA), and cerebral infarction without residual deficits: Secondary | ICD-10-CM | POA: Diagnosis not present

## 2018-06-28 DIAGNOSIS — Z79899 Other long term (current) drug therapy: Secondary | ICD-10-CM | POA: Insufficient documentation

## 2018-06-28 DIAGNOSIS — M1990 Unspecified osteoarthritis, unspecified site: Secondary | ICD-10-CM | POA: Insufficient documentation

## 2018-06-28 DIAGNOSIS — Z85848 Personal history of malignant neoplasm of other parts of nervous tissue: Secondary | ICD-10-CM | POA: Insufficient documentation

## 2018-06-28 DIAGNOSIS — N3281 Overactive bladder: Secondary | ICD-10-CM | POA: Diagnosis not present

## 2018-06-28 DIAGNOSIS — Z7982 Long term (current) use of aspirin: Secondary | ICD-10-CM | POA: Diagnosis not present

## 2018-06-28 DIAGNOSIS — E1142 Type 2 diabetes mellitus with diabetic polyneuropathy: Secondary | ICD-10-CM | POA: Insufficient documentation

## 2018-06-28 DIAGNOSIS — Z888 Allergy status to other drugs, medicaments and biological substances status: Secondary | ICD-10-CM | POA: Diagnosis not present

## 2018-06-28 DIAGNOSIS — E785 Hyperlipidemia, unspecified: Secondary | ICD-10-CM | POA: Insufficient documentation

## 2018-06-28 DIAGNOSIS — E1151 Type 2 diabetes mellitus with diabetic peripheral angiopathy without gangrene: Secondary | ICD-10-CM | POA: Insufficient documentation

## 2018-06-28 DIAGNOSIS — Z8542 Personal history of malignant neoplasm of other parts of uterus: Secondary | ICD-10-CM | POA: Insufficient documentation

## 2018-06-28 DIAGNOSIS — Z87891 Personal history of nicotine dependence: Secondary | ICD-10-CM | POA: Diagnosis not present

## 2018-06-28 DIAGNOSIS — Z8249 Family history of ischemic heart disease and other diseases of the circulatory system: Secondary | ICD-10-CM | POA: Insufficient documentation

## 2018-06-28 DIAGNOSIS — G4700 Insomnia, unspecified: Secondary | ICD-10-CM | POA: Insufficient documentation

## 2018-06-28 DIAGNOSIS — Z9889 Other specified postprocedural states: Secondary | ICD-10-CM | POA: Insufficient documentation

## 2018-06-28 DIAGNOSIS — J449 Chronic obstructive pulmonary disease, unspecified: Secondary | ICD-10-CM | POA: Diagnosis not present

## 2018-06-28 DIAGNOSIS — Z882 Allergy status to sulfonamides status: Secondary | ICD-10-CM | POA: Insufficient documentation

## 2018-06-28 DIAGNOSIS — Z9221 Personal history of antineoplastic chemotherapy: Secondary | ICD-10-CM | POA: Insufficient documentation

## 2018-06-28 DIAGNOSIS — M21372 Foot drop, left foot: Secondary | ICD-10-CM | POA: Insufficient documentation

## 2018-06-28 DIAGNOSIS — Z8379 Family history of other diseases of the digestive system: Secondary | ICD-10-CM | POA: Insufficient documentation

## 2018-06-28 DIAGNOSIS — Z823 Family history of stroke: Secondary | ICD-10-CM | POA: Insufficient documentation

## 2018-06-28 DIAGNOSIS — Z833 Family history of diabetes mellitus: Secondary | ICD-10-CM | POA: Insufficient documentation

## 2018-06-28 DIAGNOSIS — Z9049 Acquired absence of other specified parts of digestive tract: Secondary | ICD-10-CM | POA: Insufficient documentation

## 2018-06-28 HISTORY — PX: ABDOMINAL AORTOGRAM W/LOWER EXTREMITY: CATH118223

## 2018-06-28 LAB — GLUCOSE, CAPILLARY: Glucose-Capillary: 70 mg/dL (ref 70–99)

## 2018-06-28 LAB — POCT I-STAT, CHEM 8
BUN: 12 mg/dL (ref 8–23)
CREATININE: 1 mg/dL (ref 0.44–1.00)
Calcium, Ion: 1.16 mmol/L (ref 1.15–1.40)
Chloride: 100 mmol/L (ref 98–111)
Glucose, Bld: 135 mg/dL — ABNORMAL HIGH (ref 70–99)
HEMATOCRIT: 34 % — AB (ref 36.0–46.0)
Hemoglobin: 11.6 g/dL — ABNORMAL LOW (ref 12.0–15.0)
Potassium: 4.1 mmol/L (ref 3.5–5.1)
Sodium: 139 mmol/L (ref 135–145)
TCO2: 29 mmol/L (ref 22–32)

## 2018-06-28 LAB — POCT ACTIVATED CLOTTING TIME: Activated Clotting Time: 208 seconds

## 2018-06-28 SURGERY — ABDOMINAL AORTOGRAM W/LOWER EXTREMITY
Anesthesia: LOCAL | Laterality: Right

## 2018-06-28 MED ORDER — FENTANYL CITRATE (PF) 100 MCG/2ML IJ SOLN
INTRAMUSCULAR | Status: AC
  Filled 2018-06-28: qty 2

## 2018-06-28 MED ORDER — IODIXANOL 320 MG/ML IV SOLN
INTRAVENOUS | Status: DC | PRN
  Administered 2018-06-28: 230 mL via INTRA_ARTERIAL

## 2018-06-28 MED ORDER — SODIUM CHLORIDE 0.9 % IV SOLN
INTRAVENOUS | Status: DC
  Administered 2018-06-28: 10:00:00 via INTRAVENOUS

## 2018-06-28 MED ORDER — CLOPIDOGREL BISULFATE 75 MG PO TABS
300.0000 mg | ORAL_TABLET | Freq: Once | ORAL | Status: AC
  Administered 2018-06-28: 300 mg via ORAL

## 2018-06-28 MED ORDER — LIDOCAINE HCL (PF) 1 % IJ SOLN
INTRAMUSCULAR | Status: AC
  Filled 2018-06-28: qty 30

## 2018-06-28 MED ORDER — SODIUM CHLORIDE 0.9 % IV SOLN: 250.0000 mL | INTRAVENOUS | Status: DC | PRN

## 2018-06-28 MED ORDER — HEPARIN (PORCINE) IN NACL 2-0.9 UNITS/ML
INTRAMUSCULAR | Status: AC | PRN
  Administered 2018-06-28 (×2): 500 mL

## 2018-06-28 MED ORDER — HYDRALAZINE HCL 20 MG/ML IJ SOLN: 5.0000 mg | INTRAMUSCULAR | Status: DC | PRN

## 2018-06-28 MED ORDER — ONDANSETRON HCL 4 MG/2ML IJ SOLN: 4.0000 mg | Freq: Four times a day (QID) | INTRAMUSCULAR | Status: DC | PRN

## 2018-06-28 MED ORDER — LIDOCAINE HCL (PF) 1 % IJ SOLN
INTRAMUSCULAR | Status: DC | PRN
  Administered 2018-06-28: 18 mL via INTRADERMAL

## 2018-06-28 MED ORDER — HYDROMORPHONE HCL 1 MG/ML IJ SOLN: 0.5000 mg | INTRAMUSCULAR | Status: DC | PRN

## 2018-06-28 MED ORDER — ACETAMINOPHEN 325 MG PO TABS: 650.0000 mg | ORAL_TABLET | ORAL | Status: DC | PRN

## 2018-06-28 MED ORDER — SODIUM CHLORIDE 0.9% FLUSH: 3.0000 mL | INTRAVENOUS | Status: DC | PRN

## 2018-06-28 MED ORDER — SODIUM CHLORIDE 0.9 % WEIGHT BASED INFUSION: 1.0000 mL/kg/h | INTRAVENOUS | Status: DC

## 2018-06-28 MED ORDER — OXYCODONE HCL 5 MG PO TABS: 5.0000 mg | ORAL_TABLET | ORAL | Status: DC | PRN

## 2018-06-28 MED ORDER — MIDAZOLAM HCL 2 MG/2ML IJ SOLN
INTRAMUSCULAR | Status: DC | PRN
  Administered 2018-06-28 (×3): 1 mg via INTRAVENOUS

## 2018-06-28 MED ORDER — MIDAZOLAM HCL 2 MG/2ML IJ SOLN
INTRAMUSCULAR | Status: AC
  Filled 2018-06-28: qty 2

## 2018-06-28 MED ORDER — HEPARIN SODIUM (PORCINE) 1000 UNIT/ML IJ SOLN
INTRAMUSCULAR | Status: AC
  Filled 2018-06-28: qty 1

## 2018-06-28 MED ORDER — HEPARIN (PORCINE) IN NACL 1000-0.9 UT/500ML-% IV SOLN
INTRAVENOUS | Status: AC
  Filled 2018-06-28: qty 500

## 2018-06-28 MED ORDER — LABETALOL HCL 5 MG/ML IV SOLN: 10.0000 mg | INTRAVENOUS | Status: DC | PRN

## 2018-06-28 MED ORDER — CLOPIDOGREL BISULFATE 300 MG PO TABS
ORAL_TABLET | ORAL | Status: AC
  Administered 2018-06-28: 300 mg via ORAL
  Filled 2018-06-28: qty 1

## 2018-06-28 MED ORDER — CLOPIDOGREL BISULFATE 75 MG PO TABS: 75.0000 mg | ORAL_TABLET | Freq: Every day | ORAL | Status: DC

## 2018-06-28 MED ORDER — HEPARIN SODIUM (PORCINE) 1000 UNIT/ML IJ SOLN
INTRAMUSCULAR | Status: DC | PRN
  Administered 2018-06-28: 2000 [IU] via INTRAVENOUS
  Administered 2018-06-28: 10000 [IU] via INTRAVENOUS

## 2018-06-28 MED ORDER — CLOPIDOGREL BISULFATE 75 MG PO TABS: 75.0000 mg | ORAL_TABLET | Freq: Every day | ORAL | 3 refills | Status: DC

## 2018-06-28 MED ORDER — FENTANYL CITRATE (PF) 100 MCG/2ML IJ SOLN
INTRAMUSCULAR | Status: DC | PRN
  Administered 2018-06-28: 25 ug via INTRAVENOUS
  Administered 2018-06-28: 50 ug via INTRAVENOUS
  Administered 2018-06-28 (×3): 25 ug via INTRAVENOUS

## 2018-06-28 MED ORDER — SODIUM CHLORIDE 0.9% FLUSH: 3.0000 mL | Freq: Two times a day (BID) | INTRAVENOUS | Status: DC

## 2018-06-28 SURGICAL SUPPLY — 29 items
BALLN ADMIRAL INPACT 5X250 (BALLOONS) ×2
BALLOON ADMIRAL INPACT 5X250 (BALLOONS) IMPLANT
CATH ANGIO 5F BER2 65CM (CATHETERS) ×1 IMPLANT
CATH CROSS OVER TEMPO 5F (CATHETERS) ×1 IMPLANT
CATH CXI SUPP ANG 2.6FR 150CM (CATHETERS) ×1 IMPLANT
CATH MUSTANG 4X200X135 (BALLOONS) ×1 IMPLANT
CATH OMNI FLUSH 5F 65CM (CATHETERS) ×1 IMPLANT
CATH QUICKCROSS .035X135CM (MICROCATHETER) ×2 IMPLANT
CATH QUICKCROSS SUPP .035X90CM (MICROCATHETER) ×1 IMPLANT
CATH STRAIGHT 5FR 65CM (CATHETERS) ×1 IMPLANT
COVER PRB 48X5XTLSCP FOLD TPE (BAG) IMPLANT
COVER PROBE 5X48 (BAG) ×2
DEVICE CLOSURE MYNXGRIP 6/7F (Vascular Products) ×1 IMPLANT
DEVICE TORQUE .025-.038 (MISCELLANEOUS) ×1 IMPLANT
GLIDEWIRE ADV .035X260CM (WIRE) ×1 IMPLANT
GUIDEWIRE ANGLED .035X260CM (WIRE) ×1 IMPLANT
KIT ENCORE 26 ADVANTAGE (KITS) ×1 IMPLANT
KIT MICROPUNCTURE NIT STIFF (SHEATH) ×1 IMPLANT
KIT PV (KITS) ×2 IMPLANT
SHEATH HIGHFLEX ANSEL 6FRX55 (SHEATH) ×1 IMPLANT
SHEATH PINNACLE 5F 10CM (SHEATH) ×1 IMPLANT
SHEATH PINNACLE 6F 10CM (SHEATH) ×1 IMPLANT
STENT TIGRIS 5X100X120 (Permanent Stent) ×2 IMPLANT
STOPCOCK MORSE 400PSI 3WAY (MISCELLANEOUS) ×1 IMPLANT
SYR MEDRAD MARK V 150ML (SYRINGE) ×2 IMPLANT
TRANSDUCER W/STOPCOCK (MISCELLANEOUS) ×2 IMPLANT
TRAY PV CATH (CUSTOM PROCEDURE TRAY) ×2 IMPLANT
WIRE BENTSON .035X145CM (WIRE) ×1 IMPLANT
WIRE G V18X300CM (WIRE) ×1 IMPLANT

## 2018-06-28 NOTE — Discharge Instructions (Signed)
° °  [facility]  Discharge Instructions  Lower Extremity Angiogram; Angioplasty/Stenting  Please refer to the following instructions for your post-procedure care. Your surgeon or physician assistant will discuss any changes with you.  Activity  Avoid lifting more than 8 pounds (1 gallons of milk) for 72 hours (3 days) after your procedure. You may walk as much as you can tolerate.  Bathing/Showering  You may shower the day after your procedure. If you have a bandage, you may remove it at 24 hours. Clean your incision site with mild soap and water. Pat the area dry with a clean towel.  Diet  DRINK PLENTY OF FLUIDS FOR THE NEXT 2-3 DAYS TO KEEP HYDRATED.  Resume your pre-procedure diet. There are no special food restrictions following this procedure. All patients with peripheral vascular disease should follow a low fat/low cholesterol diet. In order to heal from your surgery, it is CRITICAL to get adequate nutrition. Your body requires vitamins, minerals, and protein. Vegetables are the best source of vitamins and minerals. Vegetables also provide the perfect balance of protein. Processed food has little nutritional value, so try to avoid this.  Medications  Resume taking all of your medications unless your doctor tells you not to. If your incision is causing pain, you may take over-the-counter pain relievers such as acetaminophen (Tylenol)  Follow Up  Follow up will be arranged at the time of your procedure. You may have an office visit scheduled or may be scheduled for surgery. Ask your surgeon if you have any questions.  Please call us immediately for any of the following conditions: Severe or worsening pain your legs or feet at rest or with walking. Increased pain, redness, drainage at your groin puncture site. Fever of 101 degrees or higher. If you have any mild or slow bleeding from your puncture site: lie down, apply firm constant pressure  over the area with a piece of gauze or a clean wash cloth for 30 minutes- no peeking!, call 911 right away if you are still bleeding after 30 minutes, or if the bleeding is heavy and unmanageable.  Reduce your risk factors of vascular disease:  Stop smoking. If you would like help call [facility] at 1-800-QUIT-NOW 867-281-5403) or [place] at (845)409-9016. Manage your cholesterol Maintain a desired weight Control your diabetes Keep your blood pressure down  If you have any questions, please call the office at [phone]

## 2018-06-28 NOTE — Progress Notes (Signed)
Pt states she has a pressure sore on her coccyx area and right foot  Dressing intact to right foot. Husband states he uses Vaseline to coccyx area.

## 2018-06-28 NOTE — H&P (Signed)
HP   History of Present Illness: This is a 67 y.o. female with known bilateral lower extremity vascular disease.  She is now planned for surgery for left foot drop and is indicated for angiogram for which she presents today.  Past Medical History:  Diagnosis Date  . Adenomatous colon polyp   . Anemia   . Anxiety   . AVM (arteriovenous malformation)    cecum  . CAD (coronary artery disease)   . Carotid artery occlusion   . Cataract    bil cateracts removed  . COPD (chronic obstructive pulmonary disease) ([place])   . Cough   . DDD (degenerative disc disease), lumbar   . Depression   . Diabetes mellitus   . Elevated LFTs   . Endometrial ca [facility])    endometrial ca dx 4/11  . GERD (gastroesophageal reflux disease)   . Gout 2015  . Hernia of flank   . Hydronephrosis   . Hyperlipidemia   . Hypertension   . Insomnia   . OAB (overactive bladder)   . Osteoarthritis   . Peripheral neuropathy    following chemotherapy  . Psoriasis   . Pulmonary nodule    since 2016  . Radiation 05/01/2011-06/11/11   5600 cGy 28 fxs/periaortic  . Stroke ([place])    tia  . Thyroid nodule   . TIA (transient ischemic attack) 2015  . 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 Uterine cancer ([place])   . Ventral hernia    postoperative since 2009    Past Surgical History:  Procedure Laterality Date  . ABDOMINAL HYSTERECTOMY  2009   for ca  . BACK SURGERY  Aug/sept 2014   3 back surgeries  (Aug 2014 & Sept 2014)  . CHOLECYSTECTOMY     biliteral  . COLONOSCOPY  11/13/2014   negative except for a small cecal AVM and 1 polyp  . ENDARTERECTOMY Right 08/17/2015   Procedure: Right Carotid Endartarectomy with patch Angioplasty ;  Surgeon: Mal Misty, MD;  Location: [facility];  Service: Vascular;  Laterality: Right;  . EYE SURGERY Right Aug. 2013   Cataract, lens implant  . EYE SURGERY Left Sept. 2013   Cataract, lens implant  . I & D of lumbar fusion /removal of hardware for  chronic infection  Jan. 8, 2015  . right carotid endarterectomy  2016  . SPINE SURGERY      Allergies  Allergen Reactions  . Ambien [Zolpidem Tartrate] Other (See Comments)    Per the pt, "it makes me go crazy"  . Sulfa Antibiotics Rash  . Elavil [Amitriptyline Hcl] Other (See Comments)    Gastritis  . Prozac [Fluoxetine Hcl] Anxiety and Other (See Comments)    irritability     Prior to Admission medications   Medication Sig Start Date End Date Taking? Authorizing Provider  Albuterol Sulfate (PROAIR RESPICLICK) 932 (90 Base) MCG/ACT AEPB Inhale 2 puffs into the lungs daily.    Yes [provider]  amLODipine (NORVASC) 5 MG tablet Take 1 tablet (5 mg total) by mouth daily. 08/26/17  Yes Janece Canterbury, MD  aspirin EC 81 MG tablet Take 81 mg by mouth daily.   Yes [provider]  Cholecalciferol (VITAMIN D3) 1000 UNITS CAPS Take 1,000 Units by mouth daily.    Yes [provider]  escitalopram (LEXAPRO) 20 MG tablet Take 20 mg by mouth daily.   Yes [provider]  esomeprazole (NEXIUM) 40 MG capsule Take 40  mg by mouth daily.    Yes [provider]  ferrous sulfate 325 (65 FE) MG tablet Take 325 mg by mouth daily.    Yes [provider]  Fluticasone-Salmeterol (ADVAIR) 250-50 MCG/DOSE AEPB Inhale 1 puff into the lungs 2 (two) times daily as needed (shortness of breath).    Yes [provider]  folic acid (FOLVITE) 1 MG tablet Take 1 mg by mouth daily.   Yes [provider]  furosemide (LASIX) 40 MG tablet Take 40 mg by mouth daily.  08/14/17  Yes [provider]  gabapentin (NEURONTIN) 600 MG tablet Take 600 mg by mouth 3 (three) times daily.   Yes [provider]  insulin glargine (LANTUS) 100 UNIT/ML injection Inject 70 Units into the skin daily.    Yes [provider]  isosorbide mononitrate (IMDUR) 30 MG 24 hr tablet Take 1 tablet (30 mg total) by mouth daily. 01/28/18  Yes Kilroy, Luke  K, PA-C  losartan (COZAAR) 50 MG tablet Take 50 mg by mouth daily.  06/14/17  Yes [provider]  magnesium oxide (MAG-OX) 400 MG tablet Take 400 mg by mouth daily.   Yes [provider]  Melatonin 10 MG TABS Take 10 mg by mouth at bedtime.   Yes [provider]  metaxalone (SKELAXIN) 800 MG tablet Take 800 mg by mouth 3 (three) times daily. 6am, noon, 6pm   Yes [provider]  methotrexate 2.5 MG tablet 5-7.5 tablets. Take 7.5mg  by mouth in the morning and 5mg  in the evening once weekly on Wednesday 04/09/18  Yes [provider]  metoprolol succinate (TOPROL-XL) 50 MG 24 hr tablet Take 50 mg by mouth daily. 11/21/16  Yes [provider]  Multiple Vitamin (MULTIVITAMIN WITH MINERALS) TABS tablet Take 1 tablet by mouth daily.   Yes [provider]  nortriptyline (PAMELOR) 10 MG capsule Take 10 mg by mouth at bedtime.    Yes [provider]  OXYCONTIN 15 MG 12 hr tablet Take 1 tablet (15 mg total) by mouth every 12 (twelve) hours. 08/25/17  Yes Short, Noah Delaine, MD  potassium chloride (MICRO-K) 10 MEQ CR capsule Take 10 mEq by mouth 2 (two) times daily.  03/24/18  Yes [provider]  rosuvastatin (CRESTOR) 5 MG tablet Take 5 mg by mouth at bedtime.  01/13/17  Yes [provider]  senna (SENOKOT) 8.6 MG tablet Take 1-2 tablets by mouth daily. 1 tablet in the morning and 2 tablets at night   Yes [provider]  tapentadol (NUCYNTA) 50 MG tablet Take 1 tablet (50 mg total) by mouth every 6 (six) hours. 6 am, noon, 6pm  And midnight 08/25/17  Yes Short, Noah Delaine, MD  vitamin B-12 (CYANOCOBALAMIN) 1000 MCG tablet Take 1,000 mcg by mouth daily.   Yes [provider]  glucose blood (ACCU-CHEK AVIVA PLUS) test strip 1 each by Other route as needed. Use as instructed     [provider]  nitroGLYCERIN (NITROSTAT) 0.4 MG SL tablet Place 1 tablet (0.4 mg total) under the tongue every 5 (five)  minutes as needed for chest pain. 01/26/18   Croitoru, Mihai, MD  ondansetron (ZOFRAN-ODT) 4 MG disintegrating tablet Take 4 mg by mouth every 8 (eight) hours as needed for nausea or vomiting.  09/16/16   [provider]    Social History   Socioeconomic History  . Marital status: Married    Spouse name: Not on file  . Number of children: 1  . Years  of education: College  . Highest education level: Not on file  Occupational History  . Occupation: disabled  Social Needs  . Financial resource strain: Not on file  . Food insecurity:    Worry: Not on file    Inability: Not on file  . Transportation needs:    Medical: Not on file    Non-medical: Not on file  Tobacco Use  . Smoking status: Former Smoker    Packs/day: 0.10    Years: 40.00    Pack years: 4.00    Types: Cigarettes    Last attempt to quit: 01/2017    Years since quitting: 1.4  . Smokeless tobacco: Never Used  Substance and Sexual Activity  . Alcohol use: No    Alcohol/week: 0.0 oz  . Drug use: No  . Sexual activity: Never  Lifestyle  . Physical activity:    Days per week: Not on file    Minutes per session: Not on file  . Stress: Not on file  Relationships  . Social connections:    Talks on phone: Not on file    Gets together: Not on file    Attends religious service: Not on file    Active member of club or organization: Not on file    Attends meetings of clubs or organizations: Not on file    Relationship status: Not on file  . Intimate partner violence:    Fear of current or ex partner: Not on file    Emotionally abused: Not on file    Physically abused: Not on file    Forced sexual activity: Not on file  Other Topics Concern  . Not on file  Social History Narrative   Married, 1 daughter   Disabled   Right-hand   Caffeine: caffeine free sodas    Family History  Problem Relation Age of Onset  . Stroke Mother   . Irritable bowel syndrome Mother   . Heart attack Father 67       died  .  Heart disease Father        Heart Disease before age 68, rheumatic heart disease  . Hyperlipidemia Father   . Hypertension Father   . Diabetes Daughter   . Hypertension Daughter   . Irritable bowel syndrome Daughter   . Colon cancer Neg Hx   . Esophageal cancer Neg Hx   . Pancreatic cancer Neg Hx   . Rectal cancer Neg Hx   . Stomach cancer Neg Hx   . Neuropathy Neg Hx     ROS: left foot drop  Physical Examination  Vitals:   06/28/18 0943  Pulse: 70  Temp: 98.7 F (37.1 C)  SpO2: 97%   Body mass index is 32.12 kg/m.  Awake alert and oriented Nonlabored respirations Bilateral lower extremities without edema no palpable pulses.  CBC    Component Value Date/Time   WBC 11.4 (H) 01/28/2018 1035   WBC 13.5 (H) 09/11/2017 2011   RBC 3.92 01/28/2018 1035   RBC 3.49 (L) 09/11/2017 2011   HGB 11.6 (L) 06/28/2018 1017   HGB 11.2 01/28/2018 1035   HGB 12.0 09/29/2011 1016   HCT 34.0 (L) 06/28/2018 1017   HCT 34.4 01/28/2018 1035   HCT 35.3 09/29/2011 1016   PLT 384 (H) 01/28/2018 1035   MCV 88 01/28/2018 1035   MCV 88.0 09/29/2011 1016   MCH 28.6 01/28/2018 1035   MCH 28.9 09/11/2017 2011   MCHC 32.6 01/28/2018 1035   MCHC 32.4 09/11/2017 2011  RDW 17.6 (H) 01/28/2018 1035   RDW 14.8 (H) 09/29/2011 1016   LYMPHSABS 1.1 09/11/2017 2011   LYMPHSABS 0.6 (L) 09/29/2011 1016   MONOABS 1.5 (H) 09/11/2017 2011   MONOABS 0.8 09/29/2011 1016   EOSABS 0.2 09/11/2017 2011   EOSABS 0.3 09/29/2011 1016   BASOSABS 0.0 09/11/2017 2011   BASOSABS 0.1 09/29/2011 1016    BMET    Component Value Date/Time   NA 139 06/28/2018 1017   NA 142 01/28/2018 1035   K 4.1 06/28/2018 1017   CL 100 06/28/2018 1017   CO2 26 01/28/2018 1035   GLUCOSE 135 (H) 06/28/2018 1017   BUN 12 06/28/2018 1017   BUN 19 01/28/2018 1035   BUN 14.7 07/18/2014 1142   CREATININE 1.00 06/28/2018 1017   CREATININE 1.0 07/18/2014 1142   CALCIUM 9.4 01/28/2018 1035   GFRNONAA 53 (L) 01/28/2018 1035    GFRAA 61 01/28/2018 1035    COAGS: Lab Results  Component Value Date   INR 1.14 08/22/2017   INR 1.1 (H) 08/13/2017   INR 1.15 08/14/2015       ASSESSMENT/PLAN: This is a 67 y.o. female with bilateral lower extremity peripheral arterial disease.  She is planned for surgery to correct a left foot drop.  She takes baby aspirin at this time.  I told her that should we intervene on her left lower extremity she would need to take Plavix for at least one month which would delay the surgery.  She demonstrates good understanding we will proceed today with aortogram bilateral lower extremity runoff possible intervention on the left.    Solmon Bohr C. Donzetta Matters, MD [facility] [place] Office: [phone] Pager: [phone]

## 2018-06-28 NOTE — Op Note (Signed)
Patient name: Lauren [name] MRN: [id number] DOB: [birth year] Sex: female  06/28/2018 Pre-operative Diagnosis: Peripheral arterial disease Post-operative diagnosis:  Same Surgeon:  Erlene Quan C. Donzetta Matters, MD Procedure Performed: 1.  Ultrasound-guided cannulation right common femoral artery 2.  Aortogram bilateral lower extremity runoff 3.  Drug-coated balloon angioplasty left SFA with 4 mm balloon 4.  Stent of left SFA and popliteal arteries with 5 x 100 Tigris x2 5.  Minx closure device right common femoral artery puncture site 6.  Moderate sedation with fentanyl Versed for 111 minutes   Indications: 67 year old female with history of peripheral arterial disease.  We elected to not undergo any intervention but given that she is now to undergo left foot surgery for ankle drop we have elected to proceed with angiogram possible left lower extremity intervention.  Findings: Aorta and iliac segments are incredibly diminutive and tortuous some of this may be related to previous back surgery.  The right SFA is patent and appears to be dominant runoff via at least the posterior tibial artery.  On the left side the SFA is diminutive at the takeoff and then has one area of 80% stenosis in another area where is occluded for short segment and reconstitutes to below-knee popliteal artery which then runs off via the peroneal is the dominant runoff.  Following balloon angioplasty and stenting there is no further dissection or stenosis where once we were occluded and   Procedure:  The patient was identified in the holding area and taken to room 8.  The patient was then placed supine on the table and prepped and draped in the usual sterile fashion.  A time out was called.  Ultrasound was used to evaluate the right common femoral artery and an image was saved to the chart.  1% lidocaine was used to anesthetize the area was cannulated under direct visualization with ultrasound with micropuncture needle and a wire and  sheath were placed.  We then placed a Bentson wire a short 5 [place] sheath.  I did use a bare catheter to get into the aorta and then I placed a Glidewire advantage to get an Omni catheter into the aorta and performed aortogram with bilateral lower extremity runoff with the above findings.  I did across the bifurcation using a crossover catheter and Glidewire advantage performed left lower extremity angiogram.  We then exchanged for a long 6 French sheath into the  left common femoral artery and patient was heparinized and ACT returned greater than 200 she was given a few more thousand of heparin at that time it was not rechecked.  We then used a quick cross catheter Glidewire advantage to attempt to cross the occlusion.  I then downsized to an 018 system with a CXI catheter V 18 but it could not still not get through this especially given the tortuosity in the iliacs.  I was unable to use a longer quick cross catheter and a regular Glidewire get into the below-knee popliteal artery and confirmed intraluminal access.  I then dilated the lesions with 4 mm balloon followed by drug-coated balloon angioplasty.  Unfortunately there was still dissection although was not flow-limiting I elected to stent.  I placed 2 Tigris 5 x 100 stents and postdilated this with a 4 mm balloon.  There was line in-line flow through the entirety of the SFA.  I then exchanged over a Glidewire advantage for short 6 French sheath and deployed a minx device.  This took well.  She tolerated procedure well  without immediate complication.  Next  Contrast: 220 cc.   Manly Nestle C. Donzetta Matters, MD [facility] Office: [phone] Pager: [phone]

## 2018-06-28 NOTE — Progress Notes (Signed)
Annie Main called and informed no History and physical.

## 2018-06-29 ENCOUNTER — Telehealth: Payer: Self-pay | Admitting: Vascular Surgery

## 2018-06-29 ENCOUNTER — Encounter (HOSPITAL_COMMUNITY): Payer: Self-pay | Admitting: Vascular Surgery

## 2018-06-29 ENCOUNTER — Other Ambulatory Visit: Payer: Self-pay | Admitting: Cardiology

## 2018-06-29 MED FILL — Heparin Sod (Porcine)-NaCl IV Soln 1000 Unit/500ML-0.9%: INTRAVENOUS | Qty: 1000 | Status: AC

## 2018-06-29 NOTE — Telephone Encounter (Signed)
sch appt spk to pt husband 07/22/18 10am BAI 11am LE ART 07/30/18 11am p/o MD

## 2018-07-02 ENCOUNTER — Other Ambulatory Visit: Payer: Self-pay

## 2018-07-02 DIAGNOSIS — Z48812 Encounter for surgical aftercare following surgery on the circulatory system: Secondary | ICD-10-CM

## 2018-07-02 DIAGNOSIS — I739 Peripheral vascular disease, unspecified: Secondary | ICD-10-CM

## 2018-07-13 ENCOUNTER — Ambulatory Visit
Admission: RE | Admit: 2018-07-13 | Discharge: 2018-07-13 | Disposition: A | Discharge status: Home / Self Care | Payer: Medicare Other | Source: Ambulatory Visit | Attending: Family Medicine | Admitting: Family Medicine

## 2018-07-13 DIAGNOSIS — Z1231 Encounter for screening mammogram for malignant neoplasm of breast: Secondary | ICD-10-CM

## 2018-07-22 ENCOUNTER — Ambulatory Visit (INDEPENDENT_AMBULATORY_CARE_PROVIDER_SITE_OTHER)
Admission: RE | Admit: 2018-07-22 | Discharge: 2018-07-22 | Disposition: A | Discharge status: Home / Self Care | Payer: Medicare Other | Source: Ambulatory Visit | Attending: Vascular Surgery | Admitting: Vascular Surgery

## 2018-07-22 ENCOUNTER — Ambulatory Visit (HOSPITAL_COMMUNITY)
Admission: RE | Admit: 2018-07-22 | Discharge: 2018-07-22 | Disposition: A | Discharge status: Home / Self Care | Payer: Medicare Other | Source: Ambulatory Visit | Attending: Vascular Surgery | Admitting: Vascular Surgery

## 2018-07-22 DIAGNOSIS — Z48812 Encounter for surgical aftercare following surgery on the circulatory system: Secondary | ICD-10-CM

## 2018-07-22 DIAGNOSIS — I739 Peripheral vascular disease, unspecified: Secondary | ICD-10-CM

## 2018-07-28 ENCOUNTER — Other Ambulatory Visit: Payer: Self-pay | Admitting: Surgery

## 2018-07-28 DIAGNOSIS — E041 Nontoxic single thyroid nodule: Secondary | ICD-10-CM

## 2018-07-29 ENCOUNTER — Inpatient Hospital Stay (HOSPITAL_COMMUNITY)
Admission: EM | Admit: 2018-07-29 | Discharge: 2018-08-07 | DRG: 394 | Disposition: A | Discharge status: Home Health | Payer: Medicare Other | Attending: Internal Medicine | Admitting: Internal Medicine

## 2018-07-29 ENCOUNTER — Encounter (HOSPITAL_COMMUNITY): Payer: Medicare Other

## 2018-07-29 ENCOUNTER — Encounter (HOSPITAL_COMMUNITY): Payer: Self-pay | Admitting: *Deleted

## 2018-07-29 ENCOUNTER — Other Ambulatory Visit: Payer: Self-pay

## 2018-07-29 DIAGNOSIS — E669 Obesity, unspecified: Secondary | ICD-10-CM | POA: Diagnosis present

## 2018-07-29 DIAGNOSIS — IMO0002 Reserved for concepts with insufficient information to code with codable children: Secondary | ICD-10-CM

## 2018-07-29 DIAGNOSIS — M109 Gout, unspecified: Secondary | ICD-10-CM | POA: Diagnosis present

## 2018-07-29 DIAGNOSIS — Z961 Presence of intraocular lens: Secondary | ICD-10-CM | POA: Diagnosis present

## 2018-07-29 DIAGNOSIS — Z6831 Body mass index (BMI) 31.0-31.9, adult: Secondary | ICD-10-CM

## 2018-07-29 DIAGNOSIS — Z79891 Long term (current) use of opiate analgesic: Secondary | ICD-10-CM

## 2018-07-29 DIAGNOSIS — K56609 Unspecified intestinal obstruction, unspecified as to partial versus complete obstruction: Secondary | ICD-10-CM | POA: Diagnosis present

## 2018-07-29 DIAGNOSIS — K430 Incisional hernia with obstruction, without gangrene: Principal | ICD-10-CM | POA: Diagnosis present

## 2018-07-29 DIAGNOSIS — N136 Pyonephrosis: Secondary | ICD-10-CM | POA: Diagnosis present

## 2018-07-29 DIAGNOSIS — T83511A Infection and inflammatory reaction due to indwelling urethral catheter, initial encounter: Secondary | ICD-10-CM

## 2018-07-29 DIAGNOSIS — K219 Gastro-esophageal reflux disease without esophagitis: Secondary | ICD-10-CM | POA: Diagnosis present

## 2018-07-29 DIAGNOSIS — E870 Hyperosmolality and hypernatremia: Secondary | ICD-10-CM | POA: Diagnosis present

## 2018-07-29 DIAGNOSIS — E1151 Type 2 diabetes mellitus with diabetic peripheral angiopathy without gangrene: Secondary | ICD-10-CM | POA: Diagnosis present

## 2018-07-29 DIAGNOSIS — Z8673 Personal history of transient ischemic attack (TIA), and cerebral infarction without residual deficits: Secondary | ICD-10-CM

## 2018-07-29 DIAGNOSIS — Z794 Long term (current) use of insulin: Secondary | ICD-10-CM

## 2018-07-29 DIAGNOSIS — E782 Mixed hyperlipidemia: Secondary | ICD-10-CM | POA: Diagnosis present

## 2018-07-29 DIAGNOSIS — Z8542 Personal history of malignant neoplasm of other parts of uterus: Secondary | ICD-10-CM

## 2018-07-29 DIAGNOSIS — F419 Anxiety disorder, unspecified: Secondary | ICD-10-CM | POA: Diagnosis present

## 2018-07-29 DIAGNOSIS — L97419 Non-pressure chronic ulcer of right heel and midfoot with unspecified severity: Secondary | ICD-10-CM | POA: Diagnosis present

## 2018-07-29 DIAGNOSIS — E1140 Type 2 diabetes mellitus with diabetic neuropathy, unspecified: Secondary | ICD-10-CM | POA: Diagnosis present

## 2018-07-29 DIAGNOSIS — Z8614 Personal history of Methicillin resistant Staphylococcus aureus infection: Secondary | ICD-10-CM

## 2018-07-29 DIAGNOSIS — G6181 Chronic inflammatory demyelinating polyneuritis: Secondary | ICD-10-CM | POA: Diagnosis present

## 2018-07-29 DIAGNOSIS — K439 Ventral hernia without obstruction or gangrene: Secondary | ICD-10-CM

## 2018-07-29 DIAGNOSIS — Z781 Physical restraint status: Secondary | ICD-10-CM

## 2018-07-29 DIAGNOSIS — Z8349 Family history of other endocrine, nutritional and metabolic diseases: Secondary | ICD-10-CM

## 2018-07-29 DIAGNOSIS — J449 Chronic obstructive pulmonary disease, unspecified: Secondary | ICD-10-CM | POA: Diagnosis present

## 2018-07-29 DIAGNOSIS — Z823 Family history of stroke: Secondary | ICD-10-CM

## 2018-07-29 DIAGNOSIS — R112 Nausea with vomiting, unspecified: Secondary | ICD-10-CM

## 2018-07-29 DIAGNOSIS — Z87891 Personal history of nicotine dependence: Secondary | ICD-10-CM

## 2018-07-29 DIAGNOSIS — Z4659 Encounter for fitting and adjustment of other gastrointestinal appliance and device: Secondary | ICD-10-CM

## 2018-07-29 DIAGNOSIS — N390 Urinary tract infection, site not specified: Secondary | ICD-10-CM

## 2018-07-29 DIAGNOSIS — Z79890 Hormone replacement therapy (postmenopausal): Secondary | ICD-10-CM

## 2018-07-29 DIAGNOSIS — Z90710 Acquired absence of both cervix and uterus: Secondary | ICD-10-CM

## 2018-07-29 DIAGNOSIS — E11621 Type 2 diabetes mellitus with foot ulcer: Secondary | ICD-10-CM | POA: Diagnosis present

## 2018-07-29 DIAGNOSIS — Z9841 Cataract extraction status, right eye: Secondary | ICD-10-CM

## 2018-07-29 DIAGNOSIS — E119 Type 2 diabetes mellitus without complications: Secondary | ICD-10-CM

## 2018-07-29 DIAGNOSIS — N3281 Overactive bladder: Secondary | ICD-10-CM | POA: Diagnosis present

## 2018-07-29 DIAGNOSIS — Z923 Personal history of irradiation: Secondary | ICD-10-CM

## 2018-07-29 DIAGNOSIS — G8929 Other chronic pain: Secondary | ICD-10-CM | POA: Diagnosis present

## 2018-07-29 DIAGNOSIS — Z7982 Long term (current) use of aspirin: Secondary | ICD-10-CM

## 2018-07-29 DIAGNOSIS — I5032 Chronic diastolic (congestive) heart failure: Secondary | ICD-10-CM | POA: Diagnosis present

## 2018-07-29 DIAGNOSIS — Z9049 Acquired absence of other specified parts of digestive tract: Secondary | ICD-10-CM

## 2018-07-29 DIAGNOSIS — E1142 Type 2 diabetes mellitus with diabetic polyneuropathy: Secondary | ICD-10-CM | POA: Diagnosis present

## 2018-07-29 DIAGNOSIS — Z79899 Other long term (current) drug therapy: Secondary | ICD-10-CM

## 2018-07-29 DIAGNOSIS — Z882 Allergy status to sulfonamides status: Secondary | ICD-10-CM

## 2018-07-29 DIAGNOSIS — G4700 Insomnia, unspecified: Secondary | ICD-10-CM | POA: Diagnosis present

## 2018-07-29 DIAGNOSIS — Z0189 Encounter for other specified special examinations: Secondary | ICD-10-CM

## 2018-07-29 DIAGNOSIS — Z9221 Personal history of antineoplastic chemotherapy: Secondary | ICD-10-CM

## 2018-07-29 DIAGNOSIS — Z7902 Long term (current) use of antithrombotics/antiplatelets: Secondary | ICD-10-CM

## 2018-07-29 DIAGNOSIS — F329 Major depressive disorder, single episode, unspecified: Secondary | ICD-10-CM | POA: Diagnosis present

## 2018-07-29 DIAGNOSIS — Z833 Family history of diabetes mellitus: Secondary | ICD-10-CM

## 2018-07-29 DIAGNOSIS — Z8249 Family history of ischemic heart disease and other diseases of the circulatory system: Secondary | ICD-10-CM

## 2018-07-29 DIAGNOSIS — R269 Unspecified abnormalities of gait and mobility: Secondary | ICD-10-CM | POA: Diagnosis present

## 2018-07-29 DIAGNOSIS — M21372 Foot drop, left foot: Secondary | ICD-10-CM | POA: Diagnosis present

## 2018-07-29 DIAGNOSIS — Z9842 Cataract extraction status, left eye: Secondary | ICD-10-CM

## 2018-07-29 DIAGNOSIS — R911 Solitary pulmonary nodule: Secondary | ICD-10-CM | POA: Diagnosis present

## 2018-07-29 DIAGNOSIS — R109 Unspecified abdominal pain: Secondary | ICD-10-CM

## 2018-07-29 DIAGNOSIS — I110 Hypertensive heart disease with heart failure: Secondary | ICD-10-CM | POA: Diagnosis present

## 2018-07-29 DIAGNOSIS — I2510 Atherosclerotic heart disease of native coronary artery without angina pectoris: Secondary | ICD-10-CM | POA: Diagnosis present

## 2018-07-29 DIAGNOSIS — Z86010 Personal history of colonic polyps: Secondary | ICD-10-CM

## 2018-07-29 DIAGNOSIS — I700 Atherosclerosis of aorta: Secondary | ICD-10-CM | POA: Diagnosis present

## 2018-07-29 DIAGNOSIS — M21371 Foot drop, right foot: Secondary | ICD-10-CM | POA: Diagnosis present

## 2018-07-29 DIAGNOSIS — R918 Other nonspecific abnormal finding of lung field: Secondary | ICD-10-CM | POA: Diagnosis present

## 2018-07-29 DIAGNOSIS — I2781 Cor pulmonale (chronic): Secondary | ICD-10-CM | POA: Diagnosis present

## 2018-07-29 DIAGNOSIS — E861 Hypovolemia: Secondary | ICD-10-CM | POA: Diagnosis present

## 2018-07-29 DIAGNOSIS — Z888 Allergy status to other drugs, medicaments and biological substances status: Secondary | ICD-10-CM

## 2018-07-29 LAB — URINALYSIS, ROUTINE W REFLEX MICROSCOPIC
Bilirubin Urine: NEGATIVE
Glucose, UA: NEGATIVE mg/dL
Hgb urine dipstick: NEGATIVE
KETONES UR: 5 mg/dL — AB
Nitrite: NEGATIVE
Protein, ur: NEGATIVE mg/dL
Specific Gravity, Urine: 1.02 (ref 1.005–1.030)
pH: 5 (ref 5.0–8.0)

## 2018-07-29 LAB — CBC
HCT: 38.9 % (ref 36.0–46.0)
Hemoglobin: 12.4 g/dL (ref 12.0–15.0)
MCH: 28.9 pg (ref 26.0–34.0)
MCHC: 31.9 g/dL (ref 30.0–36.0)
MCV: 90.7 fL (ref 78.0–100.0)
Platelets: 438 10*3/uL — ABNORMAL HIGH (ref 150–400)
RBC: 4.29 MIL/uL (ref 3.87–5.11)
RDW: 17.3 % — ABNORMAL HIGH (ref 11.5–15.5)
WBC: 19.1 10*3/uL — ABNORMAL HIGH (ref 4.0–10.5)

## 2018-07-29 LAB — COMPREHENSIVE METABOLIC PANEL
ALBUMIN: 4 g/dL (ref 3.5–5.0)
ALT: 25 U/L (ref 0–44)
AST: 24 U/L (ref 15–41)
Alkaline Phosphatase: 62 U/L (ref 38–126)
Anion gap: 14 (ref 5–15)
BUN: 21 mg/dL (ref 8–23)
CHLORIDE: 107 mmol/L (ref 98–111)
CO2: 29 mmol/L (ref 22–32)
Calcium: 9.9 mg/dL (ref 8.9–10.3)
Creatinine, Ser: 1.11 mg/dL — ABNORMAL HIGH (ref 0.44–1.00)
GFR calc Af Amer: 58 mL/min — ABNORMAL LOW (ref >60)
GFR calc non Af Amer: 50 mL/min — ABNORMAL LOW (ref >60)
GLUCOSE: 196 mg/dL — AB (ref 70–99)
Potassium: 4.3 mmol/L (ref 3.5–5.1)
Sodium: 150 mmol/L — ABNORMAL HIGH (ref 135–145)
Total Bilirubin: 0.7 mg/dL (ref 0.3–1.2)
Total Protein: 7.7 g/dL (ref 6.5–8.1)

## 2018-07-29 LAB — LIPASE, BLOOD: LIPASE: 23 U/L (ref 11–51)

## 2018-07-29 NOTE — ED Notes (Signed)
Bed: [facility] Expected date:  Expected time:  Means of arrival:  Comments: EMS 67 yo female nausea and vomiting x 10 since 0400-feeling dizzy [id number]'[id number]

## 2018-07-29 NOTE — ED Triage Notes (Signed)
Pt arrives via EMS per their report, pt as had abdominal pain "burning" with n/v since 4am this morning. Pt has been taking phenergan and zofran without relief. Generalized weakness, near syncopal episode today. Pt is non ambulatory from home where she lives with her husband. 24 gauge in the left forearm, zofran 4mg  given with mild relief. A/O x 4. En route, 148/72, hr 86, r 18, cbg 186

## 2018-07-30 ENCOUNTER — Inpatient Hospital Stay (HOSPITAL_COMMUNITY): Payer: Medicare Other

## 2018-07-30 ENCOUNTER — Other Ambulatory Visit: Payer: Self-pay

## 2018-07-30 ENCOUNTER — Encounter: Payer: Medicare Other | Admitting: Vascular Surgery

## 2018-07-30 ENCOUNTER — Encounter (HOSPITAL_COMMUNITY): Payer: Medicare Other

## 2018-07-30 ENCOUNTER — Encounter (HOSPITAL_COMMUNITY): Payer: Self-pay | Admitting: Internal Medicine

## 2018-07-30 ENCOUNTER — Emergency Department (HOSPITAL_COMMUNITY): Payer: Medicare Other

## 2018-07-30 DIAGNOSIS — J449 Chronic obstructive pulmonary disease, unspecified: Secondary | ICD-10-CM | POA: Diagnosis present

## 2018-07-30 DIAGNOSIS — N390 Urinary tract infection, site not specified: Secondary | ICD-10-CM | POA: Diagnosis present

## 2018-07-30 DIAGNOSIS — K56609 Unspecified intestinal obstruction, unspecified as to partial versus complete obstruction: Secondary | ICD-10-CM | POA: Diagnosis not present

## 2018-07-30 DIAGNOSIS — G6181 Chronic inflammatory demyelinating polyneuritis: Secondary | ICD-10-CM | POA: Diagnosis present

## 2018-07-30 DIAGNOSIS — E870 Hyperosmolality and hypernatremia: Secondary | ICD-10-CM | POA: Diagnosis present

## 2018-07-30 DIAGNOSIS — E119 Type 2 diabetes mellitus without complications: Secondary | ICD-10-CM | POA: Diagnosis not present

## 2018-07-30 DIAGNOSIS — E1151 Type 2 diabetes mellitus with diabetic peripheral angiopathy without gangrene: Secondary | ICD-10-CM | POA: Diagnosis present

## 2018-07-30 DIAGNOSIS — L97419 Non-pressure chronic ulcer of right heel and midfoot with unspecified severity: Secondary | ICD-10-CM | POA: Diagnosis present

## 2018-07-30 DIAGNOSIS — Z4659 Encounter for fitting and adjustment of other gastrointestinal appliance and device: Secondary | ICD-10-CM | POA: Diagnosis not present

## 2018-07-30 DIAGNOSIS — E1142 Type 2 diabetes mellitus with diabetic polyneuropathy: Secondary | ICD-10-CM | POA: Diagnosis present

## 2018-07-30 DIAGNOSIS — I110 Hypertensive heart disease with heart failure: Secondary | ICD-10-CM | POA: Diagnosis present

## 2018-07-30 DIAGNOSIS — Z0189 Encounter for other specified special examinations: Secondary | ICD-10-CM | POA: Diagnosis not present

## 2018-07-30 DIAGNOSIS — G4700 Insomnia, unspecified: Secondary | ICD-10-CM | POA: Diagnosis present

## 2018-07-30 DIAGNOSIS — R911 Solitary pulmonary nodule: Secondary | ICD-10-CM | POA: Diagnosis present

## 2018-07-30 DIAGNOSIS — F419 Anxiety disorder, unspecified: Secondary | ICD-10-CM | POA: Diagnosis present

## 2018-07-30 DIAGNOSIS — F329 Major depressive disorder, single episode, unspecified: Secondary | ICD-10-CM | POA: Diagnosis present

## 2018-07-30 DIAGNOSIS — E1140 Type 2 diabetes mellitus with diabetic neuropathy, unspecified: Secondary | ICD-10-CM | POA: Diagnosis present

## 2018-07-30 DIAGNOSIS — E782 Mixed hyperlipidemia: Secondary | ICD-10-CM | POA: Diagnosis present

## 2018-07-30 DIAGNOSIS — N136 Pyonephrosis: Secondary | ICD-10-CM | POA: Diagnosis present

## 2018-07-30 DIAGNOSIS — K430 Incisional hernia with obstruction, without gangrene: Secondary | ICD-10-CM | POA: Diagnosis present

## 2018-07-30 DIAGNOSIS — M21371 Foot drop, right foot: Secondary | ICD-10-CM | POA: Diagnosis present

## 2018-07-30 DIAGNOSIS — Z833 Family history of diabetes mellitus: Secondary | ICD-10-CM | POA: Diagnosis not present

## 2018-07-30 DIAGNOSIS — I2510 Atherosclerotic heart disease of native coronary artery without angina pectoris: Secondary | ICD-10-CM | POA: Diagnosis present

## 2018-07-30 DIAGNOSIS — M109 Gout, unspecified: Secondary | ICD-10-CM | POA: Diagnosis present

## 2018-07-30 DIAGNOSIS — I5032 Chronic diastolic (congestive) heart failure: Secondary | ICD-10-CM | POA: Diagnosis present

## 2018-07-30 DIAGNOSIS — N3281 Overactive bladder: Secondary | ICD-10-CM | POA: Diagnosis present

## 2018-07-30 DIAGNOSIS — M21372 Foot drop, left foot: Secondary | ICD-10-CM | POA: Diagnosis present

## 2018-07-30 DIAGNOSIS — K219 Gastro-esophageal reflux disease without esophagitis: Secondary | ICD-10-CM | POA: Diagnosis present

## 2018-07-30 DIAGNOSIS — I2781 Cor pulmonale (chronic): Secondary | ICD-10-CM | POA: Diagnosis present

## 2018-07-30 LAB — COMPREHENSIVE METABOLIC PANEL
ALK PHOS: 56 U/L (ref 38–126)
ALT: 22 U/L (ref 0–44)
ANION GAP: 13 (ref 5–15)
AST: 21 U/L (ref 15–41)
Albumin: 3.6 g/dL (ref 3.5–5.0)
BILIRUBIN TOTAL: 0.6 mg/dL (ref 0.3–1.2)
BUN: 24 mg/dL — ABNORMAL HIGH (ref 8–23)
CO2: 30 mmol/L (ref 22–32)
CREATININE: 1.25 mg/dL — AB (ref 0.44–1.00)
Calcium: 9.2 mg/dL (ref 8.9–10.3)
Chloride: 99 mmol/L (ref 98–111)
GFR calc non Af Amer: 43 mL/min — ABNORMAL LOW (ref >60)
GFR, EST AFRICAN AMERICAN: 50 mL/min — AB (ref >60)
GLUCOSE: 133 mg/dL — AB (ref 70–99)
Potassium: 3.6 mmol/L (ref 3.5–5.1)
Sodium: 142 mmol/L (ref 135–145)
TOTAL PROTEIN: 7.3 g/dL (ref 6.5–8.1)

## 2018-07-30 LAB — CBC
HCT: 37.9 % (ref 36.0–46.0)
HEMOGLOBIN: 12.1 g/dL (ref 12.0–15.0)
MCH: 29.1 pg (ref 26.0–34.0)
MCHC: 31.9 g/dL (ref 30.0–36.0)
MCV: 91.1 fL (ref 78.0–100.0)
PLATELETS: 432 10*3/uL — AB (ref 150–400)
RBC: 4.16 MIL/uL (ref 3.87–5.11)
RDW: 17.6 % — ABNORMAL HIGH (ref 11.5–15.5)
WBC: 16.3 10*3/uL — ABNORMAL HIGH (ref 4.0–10.5)

## 2018-07-30 LAB — GLUCOSE, CAPILLARY
GLUCOSE-CAPILLARY: 160 mg/dL — AB (ref 70–99)
GLUCOSE-CAPILLARY: 168 mg/dL — AB (ref 70–99)
GLUCOSE-CAPILLARY: 163 mg/dL — AB (ref 70–99)
Glucose-Capillary: 112 mg/dL — ABNORMAL HIGH (ref 70–99)
Glucose-Capillary: 149 mg/dL — ABNORMAL HIGH (ref 70–99)

## 2018-07-30 LAB — HIV ANTIBODY (ROUTINE TESTING W REFLEX): HIV Screen 4th Generation wRfx: NONREACTIVE

## 2018-07-30 MED ORDER — ALBUTEROL SULFATE 108 (90 BASE) MCG/ACT IN AEPB: 2.0000 | INHALATION_SPRAY | Freq: Every day | RESPIRATORY_TRACT | Status: DC

## 2018-07-30 MED ORDER — SODIUM CHLORIDE 0.9 % IV BOLUS: 250.0000 mL | Freq: Once | INTRAVENOUS | Status: DC

## 2018-07-30 MED ORDER — ESCITALOPRAM OXALATE 20 MG PO TABS
20.0000 mg | ORAL_TABLET | Freq: Every day | ORAL | Status: DC
  Administered 2018-07-30 – 2018-08-02 (×4): 20 mg via ORAL
  Filled 2018-07-30 (×4): qty 1

## 2018-07-30 MED ORDER — GERHARDT'S BUTT CREAM
TOPICAL_CREAM | Freq: Two times a day (BID) | CUTANEOUS | Status: DC
  Administered 2018-07-30 – 2018-08-07 (×16): via TOPICAL
  Filled 2018-07-30: qty 1

## 2018-07-30 MED ORDER — POTASSIUM CHLORIDE 2 MEQ/ML IV SOLN: INTRAVENOUS | Status: DC

## 2018-07-30 MED ORDER — SODIUM CHLORIDE 0.9 % IV SOLN
1.0000 g | Freq: Once | INTRAVENOUS | Status: AC
  Administered 2018-07-30: 1 g via INTRAVENOUS
  Filled 2018-07-30: qty 10

## 2018-07-30 MED ORDER — OXYCODONE HCL ER 15 MG PO T12A
15.0000 mg | EXTENDED_RELEASE_TABLET | Freq: Two times a day (BID) | ORAL | Status: DC
  Administered 2018-07-30 – 2018-08-02 (×8): 15 mg via ORAL
  Filled 2018-07-30 (×8): qty 1

## 2018-07-30 MED ORDER — SODIUM CHLORIDE 0.9 % IV SOLN
INTRAVENOUS | Status: DC
  Administered 2018-07-30: 05:00:00 via INTRAVENOUS

## 2018-07-30 MED ORDER — SODIUM CHLORIDE 0.9 % IV SOLN
1.0000 g | INTRAVENOUS | Status: DC
  Administered 2018-07-30 – 2018-08-02 (×4): 1 g via INTRAVENOUS
  Filled 2018-07-30 (×4): qty 1

## 2018-07-30 MED ORDER — ROSUVASTATIN CALCIUM 5 MG PO TABS
5.0000 mg | ORAL_TABLET | Freq: Every day | ORAL | Status: DC
  Administered 2018-07-30 – 2018-08-02 (×4): 5 mg via ORAL
  Filled 2018-07-30 (×4): qty 1

## 2018-07-30 MED ORDER — FERROUS SULFATE 325 (65 FE) MG PO TABS
325.0000 mg | ORAL_TABLET | Freq: Every day | ORAL | Status: DC
  Administered 2018-07-30 – 2018-08-02 (×4): 325 mg via ORAL
  Filled 2018-07-30 (×4): qty 1

## 2018-07-30 MED ORDER — ACETAMINOPHEN 325 MG PO TABS: 650.0000 mg | ORAL_TABLET | Freq: Four times a day (QID) | ORAL | Status: DC | PRN

## 2018-07-30 MED ORDER — LOSARTAN POTASSIUM 50 MG PO TABS
50.0000 mg | ORAL_TABLET | Freq: Every day | ORAL | Status: DC
  Administered 2018-07-30 – 2018-08-02 (×4): 50 mg via ORAL
  Filled 2018-07-30 (×4): qty 1

## 2018-07-30 MED ORDER — NORTRIPTYLINE HCL 10 MG PO CAPS
10.0000 mg | ORAL_CAPSULE | Freq: Every day | ORAL | Status: DC
  Administered 2018-07-30 – 2018-08-02 (×4): 10 mg via ORAL
  Filled 2018-07-30 (×4): qty 1

## 2018-07-30 MED ORDER — ALBUTEROL SULFATE (2.5 MG/3ML) 0.083% IN NEBU
2.5000 mg | INHALATION_SOLUTION | Freq: Every day | RESPIRATORY_TRACT | Status: DC
  Filled 2018-07-30: qty 3

## 2018-07-30 MED ORDER — SENNA 8.6 MG PO TABS
1.0000 | ORAL_TABLET | Freq: Every day | ORAL | Status: DC
  Administered 2018-07-30 – 2018-07-31 (×2): 8.6 mg via ORAL
  Administered 2018-08-01 – 2018-08-02 (×2): 17.2 mg via ORAL
  Filled 2018-07-30: qty 1
  Filled 2018-07-30: qty 2
  Filled 2018-07-30 (×2): qty 1

## 2018-07-30 MED ORDER — AMLODIPINE BESYLATE 5 MG PO TABS
5.0000 mg | ORAL_TABLET | Freq: Every day | ORAL | Status: DC
  Administered 2018-07-30 – 2018-08-02 (×4): 5 mg via ORAL
  Filled 2018-07-30 (×4): qty 1

## 2018-07-30 MED ORDER — METOPROLOL SUCCINATE ER 50 MG PO TB24
50.0000 mg | ORAL_TABLET | Freq: Every day | ORAL | Status: DC
  Administered 2018-07-30 – 2018-08-02 (×4): 50 mg via ORAL
  Filled 2018-07-30 (×4): qty 1

## 2018-07-30 MED ORDER — PANTOPRAZOLE SODIUM 40 MG PO TBEC
40.0000 mg | DELAYED_RELEASE_TABLET | Freq: Every day | ORAL | Status: DC
  Administered 2018-07-30 – 2018-07-31 (×2): 40 mg via ORAL
  Filled 2018-07-30 (×2): qty 1

## 2018-07-30 MED ORDER — IOPAMIDOL (ISOVUE-300) INJECTION 61%
INTRAVENOUS | Status: AC
  Filled 2018-07-30: qty 100

## 2018-07-30 MED ORDER — ONDANSETRON HCL 4 MG/2ML IJ SOLN
4.0000 mg | Freq: Once | INTRAMUSCULAR | Status: AC
  Administered 2018-07-30: 4 mg via INTRAVENOUS
  Filled 2018-07-30: qty 2

## 2018-07-30 MED ORDER — MELATONIN 5 MG PO TABS
10.0000 mg | ORAL_TABLET | Freq: Every day | ORAL | Status: DC
  Administered 2018-07-30 – 2018-08-02 (×4): 10 mg via ORAL
  Filled 2018-07-30 (×4): qty 2

## 2018-07-30 MED ORDER — IOPAMIDOL (ISOVUE-300) INJECTION 61%
100.0000 mL | Freq: Once | INTRAVENOUS | Status: AC | PRN
  Administered 2018-07-30: 100 mL via INTRAVENOUS

## 2018-07-30 MED ORDER — MOMETASONE FURO-FORMOTEROL FUM 200-5 MCG/ACT IN AERO
2.0000 | INHALATION_SPRAY | Freq: Two times a day (BID) | RESPIRATORY_TRACT | Status: DC
  Filled 2018-07-30: qty 8.8

## 2018-07-30 MED ORDER — ACETAMINOPHEN 650 MG RE SUPP: 650.0000 mg | Freq: Four times a day (QID) | RECTAL | Status: DC | PRN

## 2018-07-30 MED ORDER — VITAMIN D 1000 UNITS PO TABS
1000.0000 [IU] | ORAL_TABLET | Freq: Every day | ORAL | Status: DC
  Administered 2018-07-30 – 2018-08-02 (×4): 1000 [IU] via ORAL
  Filled 2018-07-30 (×4): qty 1

## 2018-07-30 MED ORDER — FUROSEMIDE 40 MG PO TABS
40.0000 mg | ORAL_TABLET | Freq: Every day | ORAL | Status: DC
  Administered 2018-07-30 – 2018-07-31 (×2): 40 mg via ORAL
  Filled 2018-07-30 (×2): qty 1

## 2018-07-30 MED ORDER — METAXALONE 800 MG PO TABS
800.0000 mg | ORAL_TABLET | ORAL | Status: DC
  Administered 2018-07-30 – 2018-08-02 (×11): 800 mg via ORAL
  Filled 2018-07-30 (×17): qty 1

## 2018-07-30 MED ORDER — KCL IN DEXTROSE-NACL 40-5-0.45 MEQ/L-%-% IV SOLN
INTRAVENOUS | Status: DC
  Administered 2018-07-30 – 2018-08-02 (×7): via INTRAVENOUS
  Filled 2018-07-30 (×9): qty 1000

## 2018-07-30 MED ORDER — MAGNESIUM OXIDE 400 (241.3 MG) MG PO TABS
400.0000 mg | ORAL_TABLET | Freq: Every day | ORAL | Status: DC
  Administered 2018-07-30 – 2018-08-02 (×4): 400 mg via ORAL
  Filled 2018-07-30 (×4): qty 1

## 2018-07-30 MED ORDER — MORPHINE SULFATE (PF) 4 MG/ML IV SOLN
4.0000 mg | Freq: Once | INTRAVENOUS | Status: AC
  Administered 2018-07-30: 4 mg via INTRAVENOUS
  Filled 2018-07-30: qty 1

## 2018-07-30 MED ORDER — INSULIN ASPART 100 UNIT/ML ~~LOC~~ SOLN
0.0000 [IU] | SUBCUTANEOUS | Status: DC
  Administered 2018-07-30: 1 [IU] via SUBCUTANEOUS
  Administered 2018-07-30 (×3): 2 [IU] via SUBCUTANEOUS
  Administered 2018-07-31 (×2): 1 [IU] via SUBCUTANEOUS
  Administered 2018-08-01: 2 [IU] via SUBCUTANEOUS
  Administered 2018-08-01 (×3): 1 [IU] via SUBCUTANEOUS
  Administered 2018-08-02 (×5): 2 [IU] via SUBCUTANEOUS
  Administered 2018-08-02: 1 [IU] via SUBCUTANEOUS
  Administered 2018-08-03: 2 [IU] via SUBCUTANEOUS
  Administered 2018-08-03 (×2): 3 [IU] via SUBCUTANEOUS
  Administered 2018-08-03 – 2018-08-04 (×2): 2 [IU] via SUBCUTANEOUS
  Administered 2018-08-04 – 2018-08-05 (×10): 1 [IU] via SUBCUTANEOUS
  Administered 2018-08-06: 2 [IU] via SUBCUTANEOUS
  Administered 2018-08-06 (×3): 1 [IU] via SUBCUTANEOUS
  Administered 2018-08-06: 2 [IU] via SUBCUTANEOUS
  Administered 2018-08-07 (×4): 1 [IU] via SUBCUTANEOUS

## 2018-07-30 MED ORDER — CLOPIDOGREL BISULFATE 75 MG PO TABS
75.0000 mg | ORAL_TABLET | Freq: Every day | ORAL | Status: DC
  Administered 2018-07-30 – 2018-07-31 (×2): 75 mg via ORAL
  Filled 2018-07-30 (×2): qty 1

## 2018-07-30 MED ORDER — TAPENTADOL HCL 50 MG PO TABS
50.0000 mg | ORAL_TABLET | Freq: Four times a day (QID) | ORAL | Status: DC
  Administered 2018-07-30 – 2018-08-07 (×26): 50 mg via ORAL
  Filled 2018-07-30 (×27): qty 1

## 2018-07-30 MED ORDER — POTASSIUM CHLORIDE CRYS ER 10 MEQ PO TBCR
10.0000 meq | EXTENDED_RELEASE_TABLET | Freq: Two times a day (BID) | ORAL | Status: DC
  Administered 2018-07-30 – 2018-08-02 (×8): 10 meq via ORAL
  Filled 2018-07-30 (×8): qty 1

## 2018-07-30 MED ORDER — DIATRIZOATE MEGLUMINE & SODIUM 66-10 % PO SOLN
90.0000 mL | Freq: Once | ORAL | Status: AC
  Administered 2018-07-30: 90 mL via NASOGASTRIC
  Filled 2018-07-30: qty 90

## 2018-07-30 MED ORDER — METOCLOPRAMIDE HCL 5 MG/ML IJ SOLN
10.0000 mg | Freq: Once | INTRAMUSCULAR | Status: AC
  Administered 2018-07-30: 10 mg via INTRAVENOUS
  Filled 2018-07-30: qty 2

## 2018-07-30 MED ORDER — ENOXAPARIN SODIUM 40 MG/0.4ML ~~LOC~~ SOLN
40.0000 mg | SUBCUTANEOUS | Status: DC
  Administered 2018-07-30 – 2018-08-07 (×8): 40 mg via SUBCUTANEOUS
  Filled 2018-07-30 (×8): qty 0.4

## 2018-07-30 MED ORDER — VITAMIN B-12 1000 MCG PO TABS
1000.0000 ug | ORAL_TABLET | Freq: Every day | ORAL | Status: DC
  Administered 2018-07-30 – 2018-08-02 (×4): 1000 ug via ORAL
  Filled 2018-07-30 (×4): qty 1

## 2018-07-30 MED ORDER — FOLIC ACID 1 MG PO TABS
1.0000 mg | ORAL_TABLET | Freq: Every day | ORAL | Status: DC
  Administered 2018-07-30 – 2018-08-02 (×4): 1 mg via ORAL
  Filled 2018-07-30 (×4): qty 1

## 2018-07-30 MED ORDER — ONDANSETRON HCL 4 MG/2ML IJ SOLN
4.0000 mg | Freq: Four times a day (QID) | INTRAMUSCULAR | Status: DC | PRN
  Administered 2018-08-02 – 2018-08-03 (×3): 4 mg via INTRAVENOUS
  Filled 2018-07-30 (×5): qty 2

## 2018-07-30 MED ORDER — ISOSORBIDE MONONITRATE ER 30 MG PO TB24
30.0000 mg | ORAL_TABLET | Freq: Every day | ORAL | Status: DC
  Administered 2018-07-30 – 2018-08-02 (×4): 30 mg via ORAL
  Filled 2018-07-30 (×4): qty 1

## 2018-07-30 MED ORDER — GABAPENTIN 300 MG PO CAPS
600.0000 mg | ORAL_CAPSULE | Freq: Three times a day (TID) | ORAL | Status: DC
  Administered 2018-07-30 – 2018-08-02 (×12): 600 mg via ORAL
  Filled 2018-07-30 (×12): qty 2

## 2018-07-30 MED ORDER — ASPIRIN EC 81 MG PO TBEC
81.0000 mg | DELAYED_RELEASE_TABLET | Freq: Every day | ORAL | Status: DC
  Administered 2018-07-30 – 2018-08-02 (×4): 81 mg via ORAL
  Filled 2018-07-30 (×4): qty 1

## 2018-07-30 NOTE — Consult Note (Signed)
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 She is currently using "silver" she reports "silver nitrate" which is a cauterizing agent. It is unclear why they would be using this. The wounds are not hypergranulated Wound type: Full thickness ulcer right heel MASD in the inner gluteal folds near the anus Pressure Injury POA: Yes Measurement:  Right heel: 02cm x 0.3cm x 0.1cm 100% pale, pink Scattered inner buttock lesions: most aprox 1cm circular lesion; yellow-white from maceration Wound bed: see above Drainage (amount, consistency, odor) minimal from either site Periwound: intact  Dressing procedure/placement/frequency: Silver hydrofiber to the heel wound, change every other day. Gerhardt's butt cream to the perianal, inner buttock wounds.  Discussed POC with patient and bedside nurse.  Re consult if needed, will not follow at this time. Thanks  Luka Reisch [employer], RN,CWOCN, CNS, [place] [phone])

## 2018-07-30 NOTE — Progress Notes (Signed)
[facility] PROGRESS NOTE    Progress Note  Lauren [name]  ZJQ:[id number] DOB: [birth year] DOA: 07/29/2018 PCP: [name], MD     Brief Narrative:   Lauren [name] is an 67 y.o. female past medical history of hypertension, hyperlipidemia, diabetes mellitus type 2, with a history of CVA, COPD psoriasis ventral hernia status post endometrial cancer comes in complaining of abdominal pain that started around 4 AM the day prior to admission she denies any fever chills diarrhea black stools bright red blood per rectum, CT scan of the abdomen and pelvis shows small bowel obstruction with a transition point lower than the ventral hernia  Assessment/Plan:   SBO (small bowel obstruction) ([place]): We will treat conservatively, placed patient n.p.o., IV fluids. Continue NG tube with intermittent suction. Replete electrolytes as needed. We will get a small bowel protocol. Awaiting surgery's input.  Hypovolemic hypernatremia: Resolved with IV fluid hydration change IV fluids to half-normal saline with potassium. Check metabolic panel in the morning Diabetes mellitus without complication (HCC)  Possible acute lower UTI UA showed many bacteria 21-50 white blood cells started empirically on IV Rocephin.  DVT prophylaxis: lovenxo Family Communication:none Disposition Plan/Barrier to D/C: home in 3 days Code Status:     Code Status Orders  (From admission, onward)        Start     Ordered   07/30/18 0247  Full code  Continuous     07/30/18 0252    Code Status History    Date Active Date Inactive Code Status Order ID Comments User Context   06/28/2018 1421 06/28/2018 1953 Full Code [id number]  [name], MD Inpatient   08/22/2017 0309 08/25/2017 1738 Full Code [id number]  [name], MD ED   02/02/2017 2358 02/05/2017 1849 Full Code [id number]  [name], DO ED   08/17/2015 1211 08/18/2015 1155 Full Code [id number]  [name], PA-C Inpatient         IV Access:    Peripheral IV   Procedures and diagnostic studies:   Dg Abdomen 1 View  Result Date: 07/30/2018 CLINICAL DATA:  Nasogastric tube placement. EXAM: ABDOMEN - 1 VIEW COMPARISON:  CT abdomen and pelvis July 30, 2018 a FINDINGS: Nasogastric tube past mid stomach, distal tip out of field of view. Included bowel gas pattern is nondilated and nonobstructive. Contrast in the RIGHT collecting system, LEFT kidney demonstrated normal contrast excretion on today's CT. Surgical clips in the included right abdomen compatible with cholecystectomy. Surgical clips mid abdomen. IMPRESSION: 1. Nasogastric tube past mid stomach, distal tip out of field of view. Electronically Signed   By: Elon Alas M.D.   On: 07/30/2018 03:36   Ct Abdomen Pelvis W Contrast  Result Date: 07/30/2018 CLINICAL DATA:  Nausea, vomiting, abdominal pain. Hernia, rule out incarceration. EXAM: CT ABDOMEN AND PELVIS WITH CONTRAST TECHNIQUE: Multidetector CT imaging of the abdomen and pelvis was performed using the standard protocol following bolus administration of intravenous contrast. CONTRAST:  123mL ISOVUE-300 IOPAMIDOL (ISOVUE-300) INJECTION 61% COMPARISON:  CT 08/21/2017 FINDINGS: Lower chest: Lung bases are clear. Hepatobiliary: No focal liver abnormality is seen. Status post cholecystectomy. No biliary dilatation. Pancreas: Parenchymal atrophy. No ductal dilatation or inflammation. Spleen: Normal in size without focal abnormality. Adrenals/Urinary Tract: No adrenal nodule. Decreased right hydronephrosis from prior exam with mild residual prominence of the renal pelvis. Resolved left hydronephrosis since prior exam. Cortical scarring throughout the left kidney. No perinephric edema. Both ureters are decompressed. Urinary bladder is decompressed by Foley catheter.  Stomach/Bowel: Distended stomach and small bowel with transition point within a small bowel loop within the large lower ventral abdominal wall hernia.  Transition point appreciated on image 30 series 6. This is not at the hernia neck, and is likely due to adhesion within the hernia sac. Hernia contains transverse colon and small bowel. No evidence pneumatosis or perforation. There is adjacent mesenteric edema and free fluid. More distal small bowel is decompressed. Small volume of stool throughout the colon without colonic wall thickening or inflammatory change. Normal appendix tentatively identified. Transverse colon enters large lower anterior abdominal wall hernia. Vascular/Lymphatic: Diffuse aorto bi-iliac atherosclerosis. No aneurysm. No acute vascular finding. No adenopathy. Reproductive: Status post hysterectomy. No adnexal masses. Other: Large ventral abdominal wall hernia in the midline with associated rectus diastasis. Fluid within the hernia sac with associated mesenteric edema. Trace free fluid in the pelvis. No free air. Musculoskeletal: Postsurgical and degenerative change in the lower lumbar spine. Grade 2 anterolisthesis of L4 on L5, with fragmented appearance of the L5 vertebra and lumbosacral junction, stable in appearance from prior. No acute osseous abnormality. IMPRESSION: 1. Small bowel obstruction, with transition point within the large lower ventral abdominal wall hernia. Transition occurs within the mesentery located in the hernia sac rather than the hernia neck suggesting adhesions as source of obstruction. Mesenteric edema and free fluid but no perforation. 2. Resolved left hydronephrosis and improved right hydronephrosis from CT 1 year ago. 3.  Aortic Atherosclerosis (ICD10-I70.0). Electronically Signed   By: Jeb Levering M.D.   On: 07/30/2018 01:10     Medical Consultants:    None.  Anti-Infectives:   rocephin  Subjective:    Lauren [name] she relates she feels better than yesterday. Objective:    Vitals:   07/30/18 0000 07/30/18 0200 07/30/18 0300 07/30/18 0400  BP: (!) 165/83 129/90 (!) 173/73 (!) 143/63    Pulse: 82 78 76 79  Resp: 20 18 12 16   Temp:    100 F (37.8 C)  TempSrc:    Oral  SpO2: 97% 93% 93% 95%  Weight:    91.8 kg (202 lb 6.1 oz)  Height:    5\' 6"  (1.676 m)    Intake/Output Summary (Last 24 hours) at 07/30/2018 0813 Last data filed at 07/30/2018 0700 Gross per 24 hour  Intake 317.15 ml  Output 900 ml  Net -582.85 ml   Filed Weights   07/30/18 0400  Weight: 91.8 kg (202 lb 6.1 oz)    Exam: General exam: In no acute distress. Respiratory system: Good air movement and clear to auscultation. Cardiovascular system: S1 & S2 heard, RRR.  Gastrointestinal system: Abdomen is soft with ventral hernia nontender to palpation no rebound or guarding. Central nervous system: Alert and oriented. No focal neurological deficits. Extremities: No pedal edema. Skin: No rashes, lesions or ulcers Psychiatry: Judgement and insight appear normal. Mood & affect appropriate.    Data Reviewed:    Labs: Basic Metabolic Panel: Recent Labs  Lab 07/29/18 2104 07/30/18 0610  NA 150* 142  K 4.3 3.6  CL 107 99  CO2 29 30  GLUCOSE 196* 133*  BUN 21 24*  CREATININE 1.11* 1.25*  CALCIUM 9.9 9.2   GFR Estimated Creatinine Clearance: 49.8 mL/min (A) (by C-G formula based on SCr of 1.25 mg/dL (H)). Liver Function Tests: Recent Labs  Lab 07/29/18 2104 07/30/18 0610  AST 24 21  ALT 25 22  ALKPHOS 62 56  BILITOT 0.7 0.6  PROT 7.7 7.3  ALBUMIN 4.0  3.6   Recent Labs  Lab 07/29/18 2104  LIPASE 23   No results for input(s): AMMONIA in the last 168 hours. Coagulation profile No results for input(s): INR, PROTIME in the last 168 hours.  CBC: Recent Labs  Lab 07/29/18 2104 07/30/18 0610  WBC 19.1* 16.3*  HGB 12.4 12.1  HCT 38.9 37.9  MCV 90.7 91.1  PLT 438* 432*   Cardiac Enzymes: No results for input(s): CKTOTAL, CKMB, CKMBINDEX, TROPONINI in the last 168 hours. BNP (last 3 results) No results for input(s): PROBNP in the last 8760 hours. CBG: Recent Labs  Lab  07/30/18 0402 07/30/18 0750  GLUCAP 160* 112*   D-Dimer: No results for input(s): DDIMER in the last 72 hours. Hgb A1c: No results for input(s): HGBA1C in the last 72 hours. Lipid Profile: No results for input(s): CHOL, HDL, LDLCALC, TRIG, CHOLHDL, LDLDIRECT in the last 72 hours. Thyroid function studies: No results for input(s): TSH, T4TOTAL, T3FREE, THYROIDAB in the last 72 hours.  Invalid input(s): FREET3 Anemia work up: No results for input(s): VITAMINB12, FOLATE, FERRITIN, TIBC, IRON, RETICCTPCT in the last 72 hours. Sepsis Labs: Recent Labs  Lab 07/29/18 2104 07/30/18 0610  WBC 19.1* 16.3*   Microbiology No results found for this or any previous visit (from the past 240 hour(s)).   Medications:   . albuterol  2.5 mg Nebulization Daily  . amLODipine  5 mg Oral Daily  . aspirin EC  81 mg Oral Daily  . cholecalciferol  1,000 Units Oral Daily  . clopidogrel  75 mg Oral Daily  . enoxaparin (LOVENOX) injection  40 mg Subcutaneous Q24H  . escitalopram  20 mg Oral Daily  . ferrous sulfate  325 mg Oral Daily  . folic acid  1 mg Oral Daily  . furosemide  40 mg Oral Daily  . gabapentin  600 mg Oral TID  . insulin aspart  0-9 Units Subcutaneous Q4H  . iopamidol      . isosorbide mononitrate  30 mg Oral Daily  . losartan  50 mg Oral Daily  . magnesium oxide  400 mg Oral Daily  . Melatonin  10 mg Oral QHS  . metaxalone  800 mg Oral 3 times per day  . metoprolol succinate  50 mg Oral Daily  . mometasone-formoterol  2 puff Inhalation BID  . nortriptyline  10 mg Oral QHS  . oxyCODONE  15 mg Oral Q12H  . pantoprazole  40 mg Oral Daily  . potassium chloride  10 mEq Oral BID  . rosuvastatin  5 mg Oral QHS  . senna  1-2 tablet Oral Daily  . tapentadol  50 mg Oral Q6H  . vitamin B-12  1,000 mcg Oral Daily   Continuous Infusions: . sodium chloride 100 mL/hr at 07/30/18 0700  . cefTRIAXone (ROCEPHIN)  IV        LOS: 0 days   Charlynne Cousins  [facility] Pager [phone]  *Please refer to [email or web address], password [email or web address] to get updated schedule on who will round on this patient, as hospitalists switch teams weekly. If 7PM-7AM, please contact night-coverage at [email or web address], password [email or web address] for any overnight needs.  07/30/2018, 8:13 AM

## 2018-07-30 NOTE — ED Notes (Signed)
admitting Provider at bedside. 

## 2018-07-30 NOTE — ED Notes (Signed)
ED TO INPATIENT HANDOFF REPORT  Name/Age/Gender Lauren [name] 67 y.o. female  Code Status    Code Status Orders  (From admission, onward)        Start     Ordered   07/30/18 0247  Full code  Continuous     07/30/18 0252    Code Status History    Date Active Date Inactive Code Status Order ID Comments User Context   06/28/2018 1421 06/28/2018 1953 Full Code [id number]  [name], MD Inpatient   08/22/2017 0309 08/25/2017 1738 Full Code [id number]  [name], MD ED   02/02/2017 2358 02/05/2017 1849 Full Code [id number]  [name], DO ED   08/17/2015 1211 08/18/2015 1155 Full Code [id number]  [name], PA-C Inpatient      Home/SNF/Other Home  Chief Complaint Nausea  Level of Care/Admitting Diagnosis ED Disposition    ED Disposition Condition Comment   Admit  Hospital Area: [facility] [[id number]]  Level of Care: Telemetry [5]  Admit to tele based on following criteria: Monitor for Ischemic changes  Diagnosis: SBO (small bowel obstruction) [facility]) [[id number]]  Admitting Physician: Jani Gravel [[id number]]  Attending Physician: Jani Gravel [phone]  Estimated length of stay: past midnight tomorrow  Certification:: I certify this patient will need inpatient services for at least 2 midnights  PT Class (Do Not Modify): Inpatient [101]  PT Acc Code (Do Not Modify): Private [1]       Medical History Past Medical History:  Diagnosis Date  . Adenomatous colon polyp   . Anemia   . Anxiety   . AVM (arteriovenous malformation)    cecum  . CAD (coronary artery disease)   . Carotid artery occlusion   . Cataract    bil cateracts removed  . COPD (chronic obstructive pulmonary disease) ([place])   . Cough   . DDD (degenerative disc disease), lumbar   . Depression   . Diabetes mellitus   . Elevated LFTs   . Endometrial ca [facility])    endometrial ca dx 4/11  . GERD (gastroesophageal reflux disease)   . Gout 2015  . Hernia of flank   .  Hydronephrosis   . Hyperlipidemia   . Hypertension   . Insomnia   . OAB (overactive bladder)   . Osteoarthritis   . Peripheral neuropathy    following chemotherapy  . Psoriasis   . Pulmonary nodule    since 2016  . Radiation 05/01/2011-06/11/11   5600 cGy 28 fxs/periaortic  . Stroke ([place])    tia  . Thyroid nodule   . TIA (transient ischemic attack) 2015  . 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 Uterine cancer ([place])   . Ventral hernia    postoperative since 2009    Allergies Allergies  Allergen Reactions  . Ambien [Zolpidem Tartrate] Other (See Comments)    Per the pt, "it makes me go crazy"  . Sulfa Antibiotics Rash  . Elavil [Amitriptyline Hcl] Other (See Comments)    Gastritis  . Prozac [Fluoxetine Hcl] Anxiety and Other (See Comments)    irritability     IV Location/Drains/Wounds Patient Lines/Drains/Airways Status   Active Line/Drains/Airways    Name:   Placement date:   Placement time:   Site:   Days:   Peripheral IV 07/29/18 Left Forearm   07/29/18    2101    Forearm   1   Sheath 06/28/18 Right Arterial;Femoral   06/28/18  1222    Arterial;Femoral   32   NG/OG Tube Nasogastric 14 Fr. Right nare Xray;Aucultation   07/30/18    0301    Right nare   less than 1   Urethral Catheter Samantha Vestal  Latex;Straight-tip 14 Fr.   08/22/17    0130    Latex;Straight-tip   342   Incision (Closed) 08/17/15 Neck Right   08/17/15    0802     1078   Pressure Injury 02/04/17 Stage II -  Partial thickness loss of dermis presenting as a shallow open ulcer with a red, pink wound bed without slough. small tear in skin   02/04/17    0800     541          Labs/Imaging Results for orders placed or performed during the hospital encounter of 07/29/18 (from the past 48 hour(s))  Lipase, blood     Status: None   Collection Time: 07/29/18  9:04 PM  Result Value Ref Range   Lipase 23 11 - 51 U/L    Comment: Performed at [facility], [place] [address] [place], [place] [address]  Comprehensive metabolic panel     Status: Abnormal   Collection Time: 07/29/18  9:04 PM  Result Value Ref Range   Sodium 150 (H) 135 - 145 mmol/L   Potassium 4.3 3.5 - 5.1 mmol/L   Chloride 107 98 - 111 mmol/L   CO2 29 22 - 32 mmol/L   Glucose, Bld 196 (H) 70 - 99 mg/dL   BUN 21 8 - 23 mg/dL   Creatinine, Ser 1.11 (H) 0.44 - 1.00 mg/dL   Calcium 9.9 8.9 - 10.3 mg/dL   Total Protein 7.7 6.5 - 8.1 g/dL   Albumin 4.0 3.5 - 5.0 g/dL   AST 24 15 - 41 U/L   ALT 25 0 - 44 U/L   Alkaline Phosphatase 62 38 - 126 U/L   Total Bilirubin 0.7 0.3 - 1.2 mg/dL   GFR calc non Af Amer 50 (L) >60 mL/min   GFR calc Af Amer 58 (L) >60 mL/min    Comment: (NOTE) The eGFR has been calculated using the CKD EPI equation. This calculation has not been validated in all clinical situations. eGFR's persistently <60 mL/min signify possible Chronic Kidney Disease.    Anion gap 14 5 - 15    Comment: Performed at [facility], [place] [address] [place], [place] [address]  CBC     Status: Abnormal   Collection Time: 07/29/18  9:04 PM  Result Value Ref Range   WBC 19.1 (H) 4.0 - 10.5 K/uL   RBC 4.29 3.87 - 5.11 MIL/uL   Hemoglobin 12.4 12.0 - 15.0 g/dL   HCT 38.9 36.0 - 46.0 %   MCV 90.7 78.0 - 100.0 fL   MCH 28.9 26.0 - 34.0 pg   MCHC 31.9 30.0 - 36.0 g/dL   RDW 17.3 (H) 11.5 - 15.5 %   Platelets 438 (H) 150 - 400 K/uL    Comment: Performed at [facility], [place] [address], [place], [place] [address]  Urinalysis, Routine w reflex microscopic     Status: Abnormal   Collection Time: 07/29/18  9:04 PM  Result Value Ref Range   Color, Urine YELLOW YELLOW   APPearance CLOUDY (A) CLEAR   Specific Gravity, Urine 1.020 1.005 - 1.030   pH 5.0 5.0 - 8.0   Glucose, UA NEGATIVE NEGATIVE mg/dL   Hgb urine dipstick NEGATIVE  NEGATIVE   Bilirubin Urine NEGATIVE NEGATIVE   Ketones, ur 5 (A) NEGATIVE mg/dL   Protein, ur NEGATIVE NEGATIVE mg/dL    Nitrite NEGATIVE NEGATIVE   Leukocytes, UA MODERATE (A) NEGATIVE   RBC / HPF 6-10 0 - 5 RBC/hpf   WBC, UA 21-50 0 - 5 WBC/hpf   Bacteria, UA MANY (A) NONE SEEN   Mucus PRESENT    Hyaline Casts, UA PRESENT    Crystals PRESENT (A) NEGATIVE    Comment: Performed at [facility], [place] [address] [place], [place] [address]   Ct Abdomen Pelvis W Contrast  Result Date: 07/30/2018 CLINICAL DATA:  Nausea, vomiting, abdominal pain. Hernia, rule out incarceration. EXAM: CT ABDOMEN AND PELVIS WITH CONTRAST TECHNIQUE: Multidetector CT imaging of the abdomen and pelvis was performed using the standard protocol following bolus administration of intravenous contrast. CONTRAST:  181m ISOVUE-300 IOPAMIDOL (ISOVUE-300) INJECTION 61% COMPARISON:  CT 08/21/2017 FINDINGS: Lower chest: Lung bases are clear. Hepatobiliary: No focal liver abnormality is seen. Status post cholecystectomy. No biliary dilatation. Pancreas: Parenchymal atrophy. No ductal dilatation or inflammation. Spleen: Normal in size without focal abnormality. Adrenals/Urinary Tract: No adrenal nodule. Decreased right hydronephrosis from prior exam with mild residual prominence of the renal pelvis. Resolved left hydronephrosis since prior exam. Cortical scarring throughout the left kidney. No perinephric edema. Both ureters are decompressed. Urinary bladder is decompressed by Foley catheter. Stomach/Bowel: Distended stomach and small bowel with transition point within a small bowel loop within the large lower ventral abdominal wall hernia. Transition point appreciated on image 30 series 6. This is not at the hernia neck, and is likely due to adhesion within the hernia sac. Hernia contains transverse colon and small bowel. No evidence pneumatosis or perforation. There is adjacent mesenteric edema and free fluid. More distal small bowel is decompressed. Small volume of stool throughout the colon without colonic wall thickening or inflammatory  change. Normal appendix tentatively identified. Transverse colon enters large lower anterior abdominal wall hernia. Vascular/Lymphatic: Diffuse aorto bi-iliac atherosclerosis. No aneurysm. No acute vascular finding. No adenopathy. Reproductive: Status post hysterectomy. No adnexal masses. Other: Large ventral abdominal wall hernia in the midline with associated rectus diastasis. Fluid within the hernia sac with associated mesenteric edema. Trace free fluid in the pelvis. No free air. Musculoskeletal: Postsurgical and degenerative change in the lower lumbar spine. Grade 2 anterolisthesis of L4 on L5, with fragmented appearance of the L5 vertebra and lumbosacral junction, stable in appearance from prior. No acute osseous abnormality. IMPRESSION: 1. Small bowel obstruction, with transition point within the large lower ventral abdominal wall hernia. Transition occurs within the mesentery located in the hernia sac rather than the hernia neck suggesting adhesions as source of obstruction. Mesenteric edema and free fluid but no perforation. 2. Resolved left hydronephrosis and improved right hydronephrosis from CT 1 year ago. 3.  Aortic Atherosclerosis (ICD10-I70.0). Electronically Signed   By: MJeb LeveringM.D.   On: 07/30/2018 01:10    Pending Labs Unresulted Labs (From admission, onward)   Start     Ordered   08/06/18 0500  Creatinine, serum  (enoxaparin (LOVENOX)    CrCl >/= 30 ml/min)  Weekly,   R    Comments:  while on enoxaparin therapy    07/30/18 0252   07/30/18 0500  Comprehensive metabolic panel  Tomorrow morning,   R     07/30/18 0252   07/30/18 0500  CBC  Tomorrow morning,   R     07/30/18 0252   07/30/18 0245  HIV antibody (Routine Testing)  Once,   R     07/30/18 0252      Vitals/Pain Today's Vitals   07/30/18 0000 07/30/18 0044 07/30/18 0044 07/30/18 0200  BP: (!) 165/83   129/90  Pulse: 82   78  Resp: 20   18  Temp:      TempSrc:      SpO2: 97%   93%  PainSc:  8  8       Isolation Precautions No active isolations  Medications Medications  iopamidol (ISOVUE-300) 61 % injection (has no administration in time range)  enoxaparin (LOVENOX) injection 40 mg (has no administration in time range)  0.9 %  sodium chloride infusion (has no administration in time range)  acetaminophen (TYLENOL) tablet 650 mg (has no administration in time range)    Or  acetaminophen (TYLENOL) suppository 650 mg (has no administration in time range)  ondansetron (ZOFRAN) injection 4 mg (has no administration in time range)  insulin aspart (novoLOG) injection 0-9 Units (has no administration in time range)  cefTRIAXone (ROCEPHIN) 1 g in sodium chloride 0.9 % 100 mL IVPB (has no administration in time range)  metoCLOPramide (REGLAN) injection 10 mg (10 mg Intravenous Given 07/30/18 0006)  morphine 4 MG/ML injection 4 mg (4 mg Intravenous Given 07/30/18 0006)  iopamidol (ISOVUE-300) 61 % injection 100 mL (100 mLs Intravenous Contrast Given 07/30/18 0022)  ondansetron (ZOFRAN) injection 4 mg (4 mg Intravenous Given 07/30/18 0238)  cefTRIAXone (ROCEPHIN) 1 g in sodium chloride 0.9 % 100 mL IVPB (0 g Intravenous Stopped 07/30/18 0322)    Mobility non-ambulatory

## 2018-07-30 NOTE — H&P (Signed)
TRH H&P   Patient Demographics:    Lauren [name], is a 67 y.o. female  MRN: [id number]   DOB - 06-25-1951  Admit Date - 07/29/2018  Outpatient Primary MD for the patient is [name], MD  Referring MD/NP/PA: Franchot Heidelberg  Outpatient Specialists:   Patient coming from: home  Chief Complaint  Patient presents with  . Abdominal Pain      HPI:    Lauren [name]  is a 67 y.o. female, w hypertension, hyperlipidemia, Dm2 , h/o CVA, CAD, Copd, Psoriasis , Ventral hernia, h/o Uterine/ endometrial  cancer s/p hysterectomy,  ccy presents with c/o n/v, abd pain (generalized), starting 4am yesterday.  Pt denies fever, chills, diarrhea, brbpr, black stool.   It was persistent and therefore pt presented to ED   In Ed,  CT scan abd/ pelvis IMPRESSION: 1. Small bowel obstruction, with transition point within the large lower ventral abdominal wall hernia. Transition occurs within the mesentery located in the hernia sac rather than the hernia neck suggesting adhesions as source of obstruction. Mesenteric edema and free fluid but no perforation.  2. Resolved left hydronephrosis and improved right hydronephrosis from CT 1 year ago. 3.  Aortic Atherosclerosis (ICD10-I70.0).  Na 150, K 4.3, Bun 21, Creatinine 1.11 Ast 24, Alt 25, Alk phos 62, T. Bili 0.7  Wbc 19.1, Hgb 12.4, Plt 438  Urinalysis Wbc 21-50, Rbc 6-10  Pt will be admitted for SBO, UTI, and hypernatremia.        Review of systems:    In addition to the HPI above,  No Fever-chills, No Headache, No changes with Vision or hearing, No problems swallowing food or Liquids, No Chest pain, Cough or Shortness of Breath,  No Blood in stool or Urine, No dysuria, No new skin rashes or bruises, No new joints pains-aches,  No new weakness, tingling, numbness in any extremity, No recent weight gain or loss, No  polyuria, polydypsia or polyphagia, No significant Mental Stressors.  A full 10 point Review of Systems was done, except as stated above, all other Review of Systems were negative.   With Past History of the following :    Past Medical History:  Diagnosis Date  . Adenomatous colon polyp   . Anemia   . Anxiety   . AVM (arteriovenous malformation)    cecum  . CAD (coronary artery disease)   . Carotid artery occlusion   . Cataract    bil cateracts removed  . COPD (chronic obstructive pulmonary disease) ([place])   . Cough   . DDD (degenerative disc disease), lumbar   . Depression   . Diabetes mellitus   . Elevated LFTs   . Endometrial ca [facility])    endometrial ca dx 4/11  . GERD (gastroesophageal reflux disease)   . Gout 2015  . Hernia of flank   . Hydronephrosis   . Hyperlipidemia   .  Hypertension   . Insomnia   . OAB (overactive bladder)   . Osteoarthritis   . Peripheral neuropathy    following chemotherapy  . Psoriasis   . Pulmonary nodule    since 2016  . Radiation 05/01/2011-06/11/11   5600 cGy 28 fxs/periaortic  . Stroke ([place])    tia  . Thyroid nodule   . TIA (transient ischemic attack) 2015  . 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 Uterine cancer ([place])   . Ventral hernia    postoperative since 2009      Past Surgical History:  Procedure Laterality Date  . ABDOMINAL AORTOGRAM W/LOWER EXTREMITY Right 06/28/2018   Procedure: ABDOMINAL AORTOGRAM W/LOWER EXTREMITY;  Surgeon: [name], MD;  Location: [place] [facility];  Service: Cardiovascular;  Laterality: Right;  . ABDOMINAL HYSTERECTOMY  2009   for ca  . BACK SURGERY  Aug/sept 2014   3 back surgeries  (Aug 2014 & Sept 2014)  . BREAST BIOPSY Left over 10 years ago   benign  . CHOLECYSTECTOMY     biliteral  . COLONOSCOPY  11/13/2014   negative except for a small cecal AVM and 1 polyp  . ENDARTERECTOMY Right 08/17/2015   Procedure: Right Carotid Endartarectomy with  patch Angioplasty ;  Surgeon: Mal Misty, MD;  Location: [place];  Service: Vascular;  Laterality: Right;  . EYE SURGERY Right Aug. 2013   Cataract, lens implant  . EYE SURGERY Left Sept. 2013   Cataract, lens implant  . I & D of lumbar fusion /removal of hardware for chronic infection  Jan. 8, 2015  . right carotid endarterectomy  2016  . SPINE SURGERY        Social History:     Social History   Tobacco Use  . Smoking status: Former Smoker    Packs/day: 0.10    Years: 40.00    Pack years: 4.00    Types: Cigarettes    Last attempt to quit: 01/2017    Years since quitting: 1.4  . Smokeless tobacco: Never Used  Substance Use Topics  . Alcohol use: No    Alcohol/week: 0.0 oz     Lives - at home  Mobility - walks w walker   Family History :     Family History  Problem Relation Age of Onset  . Stroke Mother   . Irritable bowel syndrome Mother   . Heart attack Father 93       died  . Heart disease Father        Heart Disease before age 28, rheumatic heart disease  . Hyperlipidemia Father   . Hypertension Father   . Diabetes Daughter   . Hypertension Daughter   . Irritable bowel syndrome Daughter   . Colon cancer Neg Hx   . Esophageal cancer Neg Hx   . Pancreatic cancer Neg Hx   . Rectal cancer Neg Hx   . Stomach cancer Neg Hx   . Neuropathy Neg Hx        Home Medications:   Prior to Admission medications   Medication Sig Start Date End Date Taking? Authorizing Provider  Albuterol Sulfate (PROAIR RESPICLICK) 119 (90 Base) MCG/ACT AEPB Inhale 2 puffs into the lungs daily.    Yes [provider]  amLODipine (NORVASC) 5 MG tablet Take 1 tablet (5 mg total) by mouth daily. 08/26/17  Yes Janece Canterbury, MD  aspirin EC 81 MG tablet Take 81 mg by mouth  daily.   Yes [provider]  Cholecalciferol (VITAMIN D3) 1000 UNITS CAPS Take 1,000 Units by mouth daily.    Yes [provider]  clopidogrel (PLAVIX) 75 MG tablet Take 1 tablet (75  mg total) by mouth daily. 06/28/18 06/28/19 Yes [name], MD  escitalopram (LEXAPRO) 20 MG tablet Take 20 mg by mouth daily.   Yes [provider]  ferrous sulfate 325 (65 FE) MG tablet Take 325 mg by mouth daily.    Yes [provider]  Fluticasone-Salmeterol (ADVAIR) 250-50 MCG/DOSE AEPB Inhale 1 puff into the lungs 2 (two) times daily as needed (shortness of breath).    Yes [provider]  folic acid (FOLVITE) 1 MG tablet Take 1 mg by mouth daily.   Yes [provider]  furosemide (LASIX) 40 MG tablet Take 40 mg by mouth daily.  08/14/17  Yes [provider]  gabapentin (NEURONTIN) 600 MG tablet Take 600 mg by mouth 3 (three) times daily.   Yes [provider]  insulin glargine (LANTUS) 100 UNIT/ML injection Inject 70 Units into the skin daily.    Yes [provider]  isosorbide mononitrate (IMDUR) 30 MG 24 hr tablet Take 1 tablet (30 mg total) by mouth daily. 06/29/18  Yes Croitoru, Mihai, MD  losartan (COZAAR) 50 MG tablet Take 50 mg by mouth daily.  06/14/17  Yes [provider]  magnesium oxide (MAG-OX) 400 MG tablet Take 400 mg by mouth daily.   Yes [provider]  Melatonin 10 MG TABS Take 10 mg by mouth at bedtime.   Yes [provider]  metaxalone (SKELAXIN) 800 MG tablet Take 800 mg by mouth 3 (three) times daily. 6am, noon, 6pm   Yes [provider]  methotrexate 2.5 MG tablet 5-7.5 tablets. Take 7.52m by mouth in the morning and 563min the evening once weekly on Wednesday 04/09/18  Yes [provider]  metoprolol succinate (TOPROL-XL) 50 MG 24 hr tablet Take 50 mg by mouth daily. 11/21/16  Yes [provider]  Multiple Vitamin (MULTIVITAMIN WITH MINERALS) TABS tablet Take 1 tablet by mouth daily.   Yes [provider]  nitroGLYCERIN (NITROSTAT) 0.4 MG SL tablet Place 1 tablet (0.4 mg total) under the tongue every 5 (five) minutes as needed for chest  pain. 01/26/18  Yes Croitoru, Mihai, MD  nortriptyline (PAMELOR) 10 MG capsule Take 10 mg by mouth at bedtime.    Yes [provider]  OXYCONTIN 15 MG 12 hr tablet Take 1 tablet (15 mg total) by mouth every 12 (twelve) hours. 08/25/17  Yes Short, MaNoah DelaineMD  potassium chloride (MICRO-K) 10 MEQ CR capsule Take 10 mEq by mouth 2 (two) times daily.  03/24/18  Yes [provider]  promethazine (PHENERGAN) 25 MG tablet Take 12.5-25 mg by mouth every 6 (six) hours as needed for nausea.  07/29/18  Yes [provider]  RESTA SILVER GEL Apply 1 application topically daily.   Yes [provider]  rosuvastatin (CRESTOR) 5 MG tablet Take 5 mg by mouth at bedtime.  01/13/17  Yes [provider]  senna (SENOKOT) 8.6 MG tablet Take 1-2 tablets by mouth daily. 1 tablet in the morning and 2 tablets at night   Yes [provider]  tapentadol (NUCYNTA) 50 MG tablet Take 1 tablet (50 mg total) by mouth every 6 (six) hours. 6 am, noon, 6pm  And midnight 08/25/17  Yes Short, MaNoah DelaineMD  vitamin B-12 (CYANOCOBALAMIN) 1000 MCG tablet Take  1,000 mcg by mouth daily.   Yes [provider]  esomeprazole (NEXIUM) 40 MG capsule Take 40 mg by mouth daily.     [provider]  glucose blood (ACCU-CHEK AVIVA PLUS) test strip 1 each by Other route as needed. Use as instructed     [provider]  ondansetron (ZOFRAN-ODT) 4 MG disintegrating tablet Take 4 mg by mouth every 8 (eight) hours as needed for nausea or vomiting.  09/16/16   [provider]     Allergies:     Allergies  Allergen Reactions  . Ambien [Zolpidem Tartrate] Other (See Comments)    Per the pt, "it makes me go crazy"  . Sulfa Antibiotics Rash  . Elavil [Amitriptyline Hcl] Other (See Comments)    Gastritis  . Prozac [Fluoxetine Hcl] Anxiety and Other (See Comments)    irritability      Physical Exam:   Vitals  Blood pressure 129/90, pulse 78, temperature 98 F  (36.7 C), temperature source Oral, resp. rate 18, SpO2 93 %.   1. General  lying in bed in NAD,    2. Normal affect and insight, Not Suicidal or Homicidal, Awake Alert, Oriented X 3.  3. No F.N deficits, ALL C.Nerves Intact, Strength 5/5 all 4 extremities, Sensation intact all 4 extremities, Plantars down going.  4. Ears and Eyes appear Normal, Conjunctivae clear, PERRLA. Moist Oral Mucosa.  5. Supple Neck, No JVD, No cervical lymphadenopathy appriciated, No Carotid Bruits.  6. Symmetrical Chest wall movement, Good air movement bilaterally, CTAB.  7. RRR, No Gallops, Rubs or Murmurs, No Parasternal Heave.  8. Positive Bowel Sounds, Abdomen Soft, No tenderness, No organomegaly appriciated,No rebound -guarding or rigidity.  9.  No Cyanosis, Normal Skin Turgor, No Skin Rash or Bruise.  10. Good muscle tone,  joints appear normal , no effusions, Normal ROM.  11. No Palpable Lymph Nodes in Neck or Axillae   RLQ abdominal wall hernia   Data Review:    CBC Recent Labs  Lab 07/29/18 2104  WBC 19.1*  HGB 12.4  HCT 38.9  PLT 438*  MCV 90.7  MCH 28.9  MCHC 31.9  RDW 17.3*   ------------------------------------------------------------------------------------------------------------------  Chemistries  Recent Labs  Lab 07/29/18 2104  NA 150*  K 4.3  CL 107  CO2 29  GLUCOSE 196*  BUN 21  CREATININE 1.11*  CALCIUM 9.9  AST 24  ALT 25  ALKPHOS 62  BILITOT 0.7   ------------------------------------------------------------------------------------------------------------------ CrCl cannot be calculated (Unknown ideal weight.). ------------------------------------------------------------------------------------------------------------------ No results for input(s): TSH, T4TOTAL, T3FREE, THYROIDAB in the last 72 hours.  Invalid input(s): FREET3  Coagulation profile No results for input(s): INR, PROTIME in the last 168  hours. ------------------------------------------------------------------------------------------------------------------- No results for input(s): DDIMER in the last 72 hours. -------------------------------------------------------------------------------------------------------------------  Cardiac Enzymes No results for input(s): CKMB, TROPONINI, MYOGLOBIN in the last 168 hours.  Invalid input(s): CK ------------------------------------------------------------------------------------------------------------------    Component Value Date/Time   BNP 87.1 01/28/2018 1035   BNP 125.4 (H) 08/21/2017 2015     ---------------------------------------------------------------------------------------------------------------  Urinalysis    Component Value Date/Time   COLORURINE YELLOW 07/29/2018 2104   APPEARANCEUR CLOUDY (A) 07/29/2018 2104   LABSPEC 1.020 07/29/2018 2104   LABSPEC 1.010 02/12/2009 1035   PHURINE 5.0 07/29/2018 2104   GLUCOSEU NEGATIVE 07/29/2018 2104   HGBUR NEGATIVE 07/29/2018 2104   BILIRUBINUR NEGATIVE 07/29/2018 2104   BILIRUBINUR Negative 02/12/2009 1035   KETONESUR 5 (A) 07/29/2018 2104   PROTEINUR NEGATIVE 07/29/2018 2104   UROBILINOGEN 0.2 08/14/2015 1400  NITRITE NEGATIVE 07/29/2018 2104   LEUKOCYTESUR MODERATE (A) 07/29/2018 2104   LEUKOCYTESUR Large 02/12/2009 1035    ----------------------------------------------------------------------------------------------------------------   Imaging Results:    Ct Abdomen Pelvis W Contrast  Result Date: 07/30/2018 CLINICAL DATA:  Nausea, vomiting, abdominal pain. Hernia, rule out incarceration. EXAM: CT ABDOMEN AND PELVIS WITH CONTRAST TECHNIQUE: Multidetector CT imaging of the abdomen and pelvis was performed using the standard protocol following bolus administration of intravenous contrast. CONTRAST:  169m ISOVUE-300 IOPAMIDOL (ISOVUE-300) INJECTION 61% COMPARISON:  CT 08/21/2017 FINDINGS: Lower chest: Lung  bases are clear. Hepatobiliary: No focal liver abnormality is seen. Status post cholecystectomy. No biliary dilatation. Pancreas: Parenchymal atrophy. No ductal dilatation or inflammation. Spleen: Normal in size without focal abnormality. Adrenals/Urinary Tract: No adrenal nodule. Decreased right hydronephrosis from prior exam with mild residual prominence of the renal pelvis. Resolved left hydronephrosis since prior exam. Cortical scarring throughout the left kidney. No perinephric edema. Both ureters are decompressed. Urinary bladder is decompressed by Foley catheter. Stomach/Bowel: Distended stomach and small bowel with transition point within a small bowel loop within the large lower ventral abdominal wall hernia. Transition point appreciated on image 30 series 6. This is not at the hernia neck, and is likely due to adhesion within the hernia sac. Hernia contains transverse colon and small bowel. No evidence pneumatosis or perforation. There is adjacent mesenteric edema and free fluid. More distal small bowel is decompressed. Small volume of stool throughout the colon without colonic wall thickening or inflammatory change. Normal appendix tentatively identified. Transverse colon enters large lower anterior abdominal wall hernia. Vascular/Lymphatic: Diffuse aorto bi-iliac atherosclerosis. No aneurysm. No acute vascular finding. No adenopathy. Reproductive: Status post hysterectomy. No adnexal masses. Other: Large ventral abdominal wall hernia in the midline with associated rectus diastasis. Fluid within the hernia sac with associated mesenteric edema. Trace free fluid in the pelvis. No free air. Musculoskeletal: Postsurgical and degenerative change in the lower lumbar spine. Grade 2 anterolisthesis of L4 on L5, with fragmented appearance of the L5 vertebra and lumbosacral junction, stable in appearance from prior. No acute osseous abnormality. IMPRESSION: 1. Small bowel obstruction, with transition point within  the large lower ventral abdominal wall hernia. Transition occurs within the mesentery located in the hernia sac rather than the hernia neck suggesting adhesions as source of obstruction. Mesenteric edema and free fluid but no perforation. 2. Resolved left hydronephrosis and improved right hydronephrosis from CT 1 year ago. 3.  Aortic Atherosclerosis (ICD10-I70.0). Electronically Signed   By: MJeb LeveringM.D.   On: 07/30/2018 01:10   nsr at 866 nl axis, early R progression, trace st depression v4-6    Assessment & Plan:    Principal Problem:   SBO (small bowel obstruction) (HCC) Active Problems:   Diabetes mellitus without complication ([facility]   Acute lower UTI   Hypernatremia    SBO NPO Ns iv NGT to low intermittent suction Surgery consulted by ED, appreciate input  Hypernatremia Hydrate with ns iv Check cmp in am  UTI Awaiting urine culture Rocephin 1gm iv qday  Dm2 HOLD Lantus since NPO fsbs q4h, ISS  Diabetic neuropathy Cont Gabapentin  Hypertension/ CAD Cont Imdur 354mpoq day Cont Losartan 5093mo qday Cont Toprol XL 49m33m qday Cont Crestor 5mg 25mqhs  Psoriasis Hold methotrexate due to infection     DVT Prophylaxis Lovenox - SCDs  AM Labs Ordered, also please review Full Orders  Family Communication: Admission, patients condition and plan of care including tests being ordered have been discussed  with the patient who indicate understanding and agree with the plan and Code Status.  Code Status  FULL CODE  Likely DC to  home  Condition GUARDED   Consults called:  Surgery by ED  Admission status: inpatient  Time spent in minutes : 60   Jani Gravel M.D on 07/30/2018 at 2:55 AM  Between 7am to 7pm - Pager - [phone]   After 7pm go to [email or web address] - password [facility]  [facility] - Office  [phone]

## 2018-07-30 NOTE — ED Provider Notes (Signed)
[facility] DEPT Provider Note   CSN: [id number] Arrival date & time: 07/29/18  2047     History   Chief Complaint Chief Complaint  Patient presents with  . Abdominal Pain    HPI Lauren [name] is a 67 y.o. female resenting for evaluation of abdominal pain, nausea, vomiting.  Patient states that acutely at 4:00 this morning, she is awoken from sleep with severe epigastric abdominal pain.  She subsequently has had continuous episodes of vomiting, despite at home with Phenergan and Zofran.  She reports she has had over 20 episodes of vomiting.  They are nonbloody, nonbilious.  She had one very small bowel movement right after the pain started, much smaller than normal and no bowel movement since.  She denies fevers, chills, chest pain, shortness of breath.  She has had decreased urine output with her Foley, however has not been able to tolerate any p.o. today.  She has a significant intra-abdominal history including uterine cancer, ventral hernia, OAB, endometrial cancer, colon polyps, spinal tumor, elevated LFTs.  Patient had a stent placement following abdominal aortogram 1 month ago.  HPI  Past Medical History:  Diagnosis Date  . Adenomatous colon polyp   . Anemia   . Anxiety   . AVM (arteriovenous malformation)    cecum  . CAD (coronary artery disease)   . Carotid artery occlusion   . Cataract    bil cateracts removed  . COPD (chronic obstructive pulmonary disease) ([place])   . Cough   . DDD (degenerative disc disease), lumbar   . Depression   . Diabetes mellitus   . Elevated LFTs   . Endometrial ca [facility])    endometrial ca dx 4/11  . GERD (gastroesophageal reflux disease)   . Gout 2015  . Hernia of flank   . Hydronephrosis   . Hyperlipidemia   . Hypertension   . Insomnia   . OAB (overactive bladder)   . Osteoarthritis   . Peripheral neuropathy    following chemotherapy  . Psoriasis   . Pulmonary nodule    since 2016  . Radiation  05/01/2011-06/11/11   5600 cGy 28 fxs/periaortic  . Stroke ([place])    tia  . Thyroid nodule   . TIA (transient ischemic attack) 2015  . 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 Uterine cancer ([place])   . Ventral hernia    postoperative since 2009    Patient Active Problem List   Diagnosis Date Noted  . SBO (small bowel obstruction) ([place]) 07/30/2018  . Acute lower UTI 07/30/2018  . Hypernatremia 07/30/2018  . Peripheral vascular disease due to secondary diabetes mellitus ([place]) 05/17/2018  . Foot drop, bilateral 05/17/2018  . Foot drop, left 02/11/2018  . DOE (dyspnea on exertion) 01/28/2018  . Diabetes mellitus without complication ([place]) 67/61/9509  . Diastolic dysfunction 32/67/1245  . Abnormal liver function tests 11/14/2017  . 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 Acute encephalopathy 08/22/2017  . Acute urinary retention 08/22/2017  . AKI (acute kidney injury) ([place]) 08/22/2017  . PAD (peripheral artery disease) ([place]) 08/15/2017  . Cor pulmonale, chronic ([place]) 08/15/2017  . Chronic diastolic heart failure ([place]) 08/15/2017  . History of cigarette smoking 08/15/2017  . Multiple pulmonary nodules determined by computed tomography of lung 07/31/2017  . 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 Pressure ulcer of left heel, stage 2 02/04/2017  . Altered mental status   . Community acquired pneumonia   .  Encephalopathy   . Fall   . Weakness of both lower extremities   . Chronic pain   . Narcotic dependence ([place])   . Incontinence of feces   . Sepsis ([place]) 02/02/2017  . Carotid stenosis, bilateral 01/30/2017  . Aftercare following surgery of the circulatory system 01/30/2017  . Impingement syndrome of left shoulder 12/17/2016  . Coronary artery disease of native artery of native heart with stable angina pectoris ([place]) 10/07/2016  . Iron deficiency anemia 10/07/2016  . DDD (degenerative disc disease), lumbar  10/03/2016  . Fatigue 10/03/2016  . Intervertebral disc disease 10/03/2016  . Low back pain 10/03/2016  . Myelopathy ([place]) 10/03/2016  . Prolapsed lumbosacral intervertebral disc 10/03/2016  . Spondylisthesis 10/03/2016  . MRSA (methicillin resistant staph aureus) culture positive 10/10/2015  . Carotid stenosis 08/17/2015  . Preoperative cardiovascular examination 08/10/2015  . Tobacco abuse 08/10/2015  . 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 Acute osteomyelitis of spine ([place]) 08/28/2014  . Difficult or painful urination 08/28/2014  . H/O Spinal surgery 08/28/2014  . Elevated WBC count 08/28/2014  . Encounter for aftercare for long-term (current) use of antibiotics 08/28/2014  . N&V (nausea and vomiting) 08/28/2014  . Bladder retention 08/28/2014  . Infection of urinary tract 08/28/2014  . Ventral hernia 06/15/2013  . COPD GOLD III/IV 06/15/2013  . DJD (degenerative joint disease) of lumbar spine 06/15/2013  . Depression 06/15/2013  . Exertional angina ([place]) 06/15/2013  . Preoperative evaluation 06/15/2013  . Occlusion and stenosis of carotid artery without mention of cerebral infarction 03/02/2012  . Malignant neoplasm of corpus uteri, except isthmus ([place]) 01/19/2012  . Abnormal EKG 12/07/2011  . Essential hypertension, benign 12/07/2011  . Mixed hyperlipidemia 12/07/2011  . Carotid artery disease without cerebral infarction ([place]) 12/07/2011    Past Surgical History:  Procedure Laterality Date  . ABDOMINAL AORTOGRAM W/LOWER EXTREMITY Right 06/28/2018   Procedure: ABDOMINAL AORTOGRAM W/LOWER EXTREMITY;  Surgeon: [name], MD;  Location: [facility];  Service: Cardiovascular;  Laterality: Right;  . ABDOMINAL HYSTERECTOMY  2009   for ca  . BACK SURGERY  Aug/sept 2014   3 back surgeries  (Aug 2014 & Sept 2014)  . BREAST BIOPSY Left over 10 years ago   benign  . CHOLECYSTECTOMY     biliteral  . COLONOSCOPY  11/13/2014   negative except for a small cecal  AVM and 1 polyp  . ENDARTERECTOMY Right 08/17/2015   Procedure: Right Carotid Endartarectomy with patch Angioplasty ;  Surgeon: Mal Misty, MD;  Location: [facility];  Service: Vascular;  Laterality: Right;  . EYE SURGERY Right Aug. 2013   Cataract, lens implant  . EYE SURGERY Left Sept. 2013   Cataract, lens implant  . I & D of lumbar fusion /removal of hardware for chronic infection  Jan. 8, 2015  . right carotid endarterectomy  2016  . SPINE SURGERY       OB History   None      Home Medications    Prior to Admission medications   Medication Sig Start Date End Date Taking? Authorizing Provider  Albuterol Sulfate (PROAIR RESPICLICK) 607 (90 Base) MCG/ACT AEPB Inhale 2 puffs into the lungs daily.    Yes [provider]  amLODipine (NORVASC) 5 MG tablet Take 1 tablet (5 mg total) by mouth daily. 08/26/17  Yes Janece Canterbury, MD  aspirin EC 81 MG tablet Take 81 mg by mouth daily.   Yes [provider]  Cholecalciferol (VITAMIN D3) 1000 UNITS CAPS Take  1,000 Units by mouth daily.    Yes [provider]  clopidogrel (PLAVIX) 75 MG tablet Take 1 tablet (75 mg total) by mouth daily. 06/28/18 06/28/19 Yes [name], MD  escitalopram (LEXAPRO) 20 MG tablet Take 20 mg by mouth daily.   Yes [provider]  ferrous sulfate 325 (65 FE) MG tablet Take 325 mg by mouth daily.    Yes [provider]  Fluticasone-Salmeterol (ADVAIR) 250-50 MCG/DOSE AEPB Inhale 1 puff into the lungs 2 (two) times daily as needed (shortness of breath).    Yes [provider]  folic acid (FOLVITE) 1 MG tablet Take 1 mg by mouth daily.   Yes [provider]  furosemide (LASIX) 40 MG tablet Take 40 mg by mouth daily.  08/14/17  Yes [provider]  gabapentin (NEURONTIN) 600 MG tablet Take 600 mg by mouth 3 (three) times daily.   Yes [provider]  insulin glargine (LANTUS) 100 UNIT/ML injection Inject 70 Units into the skin  daily.    Yes [provider]  isosorbide mononitrate (IMDUR) 30 MG 24 hr tablet Take 1 tablet (30 mg total) by mouth daily. 06/29/18  Yes Croitoru, Mihai, MD  losartan (COZAAR) 50 MG tablet Take 50 mg by mouth daily.  06/14/17  Yes [provider]  magnesium oxide (MAG-OX) 400 MG tablet Take 400 mg by mouth daily.   Yes [provider]  Melatonin 10 MG TABS Take 10 mg by mouth at bedtime.   Yes [provider]  metaxalone (SKELAXIN) 800 MG tablet Take 800 mg by mouth 3 (three) times daily. 6am, noon, 6pm   Yes [provider]  methotrexate 2.5 MG tablet 5-7.5 tablets. Take 7.5mg  by mouth in the morning and 5mg  in the evening once weekly on Wednesday 04/09/18  Yes [provider]  metoprolol succinate (TOPROL-XL) 50 MG 24 hr tablet Take 50 mg by mouth daily. 11/21/16  Yes [provider]  Multiple Vitamin (MULTIVITAMIN WITH MINERALS) TABS tablet Take 1 tablet by mouth daily.   Yes [provider]  nitroGLYCERIN (NITROSTAT) 0.4 MG SL tablet Place 1 tablet (0.4 mg total) under the tongue every 5 (five) minutes as needed for chest pain. 01/26/18  Yes Croitoru, Mihai, MD  nortriptyline (PAMELOR) 10 MG capsule Take 10 mg by mouth at bedtime.    Yes [provider]  OXYCONTIN 15 MG 12 hr tablet Take 1 tablet (15 mg total) by mouth every 12 (twelve) hours. 08/25/17  Yes Short, Noah Delaine, MD  potassium chloride (MICRO-K) 10 MEQ CR capsule Take 10 mEq by mouth 2 (two) times daily.  03/24/18  Yes [provider]  promethazine (PHENERGAN) 25 MG tablet Take 12.5-25 mg by mouth every 6 (six) hours as needed for nausea.  07/29/18  Yes [provider]  RESTA SILVER GEL Apply 1 application topically daily.   Yes [provider]  rosuvastatin (CRESTOR) 5 MG tablet Take 5 mg by mouth at bedtime.  01/13/17  Yes [provider]  senna (SENOKOT) 8.6 MG tablet Take 1-2 tablets by mouth daily. 1 tablet in the morning  and 2 tablets at night   Yes [provider]  tapentadol (NUCYNTA) 50 MG tablet Take 1 tablet (50 mg total) by mouth every 6 (six) hours. 6 am, noon, 6pm  And midnight 08/25/17  Yes Short, Noah Delaine, MD  vitamin B-12 (CYANOCOBALAMIN) 1000 MCG tablet Take 1,000 mcg by mouth daily.   Yes [provider]  esomeprazole ([place]) 40  MG capsule Take 40 mg by mouth daily.     [provider]  glucose blood (ACCU-CHEK AVIVA PLUS) test strip 1 each by Other route as needed. Use as instructed     [provider]  ondansetron (ZOFRAN-ODT) 4 MG disintegrating tablet Take 4 mg by mouth every 8 (eight) hours as needed for nausea or vomiting.  09/16/16   [provider]    Family History Family History  Problem Relation Age of Onset  . Stroke Mother   . Irritable bowel syndrome Mother   . Heart attack Father 52       died  . Heart disease Father        Heart Disease before age 58, rheumatic heart disease  . Hyperlipidemia Father   . Hypertension Father   . Diabetes Daughter   . Hypertension Daughter   . Irritable bowel syndrome Daughter   . Colon cancer Neg Hx   . Esophageal cancer Neg Hx   . Pancreatic cancer Neg Hx   . Rectal cancer Neg Hx   . Stomach cancer Neg Hx   . Neuropathy Neg Hx     Social History Social History   Tobacco Use  . Smoking status: Former Smoker    Packs/day: 0.10    Years: 40.00    Pack years: 4.00    Types: Cigarettes    Last attempt to quit: 01/2017    Years since quitting: 1.4  . Smokeless tobacco: Never Used  Substance Use Topics  . Alcohol use: No    Alcohol/week: 0.0 oz  . Drug use: No     Allergies   Ambien [zolpidem tartrate]; Sulfa antibiotics; Elavil [amitriptyline hcl]; and Prozac [fluoxetine hcl]   Review of Systems Review of Systems  Gastrointestinal: Positive for abdominal pain, nausea and vomiting.  Genitourinary: Positive for decreased urine volume.  All other systems reviewed and are  negative.    Physical Exam Updated Vital Signs BP (!) 143/63 (BP Location: Right Arm)   Pulse 79   Temp 100 F (37.8 C) (Oral)   Resp 16   Ht 5\' 6"  (1.676 m)   Wt 91.8 kg (202 lb 6.1 oz)   SpO2 95%   BMI 32.67 kg/m   Physical Exam  Constitutional: She is oriented to person, place, and time. She appears well-developed and well-nourished. No distress.  Appears uncomfortable due to pain, no acute distress  HENT:  Head: Normocephalic and atraumatic.  Eyes: Pupils are equal, round, and reactive to light. Conjunctivae and EOM are normal.  Neck: Normal range of motion. Neck supple.  Cardiovascular: Normal rate, regular rhythm and intact distal pulses.  Pulmonary/Chest: Effort normal and breath sounds normal. No respiratory distress. She has no wheezes.  Abdominal: Soft. Bowel sounds are normal. She exhibits no distension. There is no tenderness. A hernia is present.  Large ventral hernia without erythema.  Soft.  Generalized tenderness palpation of the abdomen, worse epigastrically and overlying the ventral hernia.  No distention.  Musculoskeletal: Normal range of motion.  Neurological: She is alert and oriented to person, place, and time.  Skin: Skin is warm and dry.  Psychiatric: She has a normal mood and affect.  Nursing note and vitals reviewed.    ED Treatments / Results  Labs (all labs ordered are listed, but only abnormal results are displayed) Labs Reviewed  COMPREHENSIVE METABOLIC PANEL - Abnormal; Notable for the following components:      Result Value   Sodium 150 (*)    Glucose,  Bld 196 (*)    Creatinine, Ser 1.11 (*)    GFR calc non Af Amer 50 (*)    GFR calc Af Amer 58 (*)    All other components within normal limits  CBC - Abnormal; Notable for the following components:   WBC 19.1 (*)    RDW 17.3 (*)    Platelets 438 (*)    All other components within normal limits  URINALYSIS, ROUTINE W REFLEX MICROSCOPIC - Abnormal; Notable for the following components:    APPearance CLOUDY (*)    Ketones, ur 5 (*)    Leukocytes, UA MODERATE (*)    Bacteria, UA MANY (*)    Crystals PRESENT (*)    All other components within normal limits  GLUCOSE, CAPILLARY - Abnormal; Notable for the following components:   Glucose-Capillary 160 (*)    All other components within normal limits  LIPASE, BLOOD  HIV ANTIBODY (ROUTINE TESTING)  COMPREHENSIVE METABOLIC PANEL  CBC    EKG None  Radiology Dg Abdomen 1 View  Result Date: 07/30/2018 CLINICAL DATA:  Nasogastric tube placement. EXAM: ABDOMEN - 1 VIEW COMPARISON:  CT abdomen and pelvis July 30, 2018 a FINDINGS: Nasogastric tube past mid stomach, distal tip out of field of view. Included bowel gas pattern is nondilated and nonobstructive. Contrast in the RIGHT collecting system, LEFT kidney demonstrated normal contrast excretion on today's CT. Surgical clips in the included right abdomen compatible with cholecystectomy. Surgical clips mid abdomen. IMPRESSION: 1. Nasogastric tube past mid stomach, distal tip out of field of view. Electronically Signed   By: Elon Alas M.D.   On: 07/30/2018 03:36   Ct Abdomen Pelvis W Contrast  Result Date: 07/30/2018 CLINICAL DATA:  Nausea, vomiting, abdominal pain. Hernia, rule out incarceration. EXAM: CT ABDOMEN AND PELVIS WITH CONTRAST TECHNIQUE: Multidetector CT imaging of the abdomen and pelvis was performed using the standard protocol following bolus administration of intravenous contrast. CONTRAST:  157mL ISOVUE-300 IOPAMIDOL (ISOVUE-300) INJECTION 61% COMPARISON:  CT 08/21/2017 FINDINGS: Lower chest: Lung bases are clear. Hepatobiliary: No focal liver abnormality is seen. Status post cholecystectomy. No biliary dilatation. Pancreas: Parenchymal atrophy. No ductal dilatation or inflammation. Spleen: Normal in size without focal abnormality. Adrenals/Urinary Tract: No adrenal nodule. Decreased right hydronephrosis from prior exam with mild residual prominence of the renal  pelvis. Resolved left hydronephrosis since prior exam. Cortical scarring throughout the left kidney. No perinephric edema. Both ureters are decompressed. Urinary bladder is decompressed by Foley catheter. Stomach/Bowel: Distended stomach and small bowel with transition point within a small bowel loop within the large lower ventral abdominal wall hernia. Transition point appreciated on image 30 series 6. This is not at the hernia neck, and is likely due to adhesion within the hernia sac. Hernia contains transverse colon and small bowel. No evidence pneumatosis or perforation. There is adjacent mesenteric edema and free fluid. More distal small bowel is decompressed. Small volume of stool throughout the colon without colonic wall thickening or inflammatory change. Normal appendix tentatively identified. Transverse colon enters large lower anterior abdominal wall hernia. Vascular/Lymphatic: Diffuse aorto bi-iliac atherosclerosis. No aneurysm. No acute vascular finding. No adenopathy. Reproductive: Status post hysterectomy. No adnexal masses. Other: Large ventral abdominal wall hernia in the midline with associated rectus diastasis. Fluid within the hernia sac with associated mesenteric edema. Trace free fluid in the pelvis. No free air. Musculoskeletal: Postsurgical and degenerative change in the lower lumbar spine. Grade 2 anterolisthesis of L4 on L5, with fragmented appearance of the L5 vertebra and lumbosacral  junction, stable in appearance from prior. No acute osseous abnormality. IMPRESSION: 1. Small bowel obstruction, with transition point within the large lower ventral abdominal wall hernia. Transition occurs within the mesentery located in the hernia sac rather than the hernia neck suggesting adhesions as source of obstruction. Mesenteric edema and free fluid but no perforation. 2. Resolved left hydronephrosis and improved right hydronephrosis from CT 1 year ago. 3.  Aortic Atherosclerosis (ICD10-I70.0).  Electronically Signed   By: Jeb Levering M.D.   On: 07/30/2018 01:10    Procedures Procedures (including critical care time)  Medications Ordered in ED Medications  iopamidol (ISOVUE-300) 61 % injection (has no administration in time range)  enoxaparin (LOVENOX) injection 40 mg (has no administration in time range)  0.9 %  sodium chloride infusion ( Intravenous New Bag/Given 07/30/18 0436)  acetaminophen (TYLENOL) tablet 650 mg (has no administration in time range)    Or  acetaminophen (TYLENOL) suppository 650 mg (has no administration in time range)  ondansetron (ZOFRAN) injection 4 mg (has no administration in time range)  insulin aspart (novoLOG) injection 0-9 Units (2 Units Subcutaneous Given 07/30/18 0447)  cefTRIAXone (ROCEPHIN) 1 g in sodium chloride 0.9 % 100 mL IVPB (has no administration in time range)  amLODipine (NORVASC) tablet 5 mg (has no administration in time range)  cholecalciferol (VITAMIN D) tablet 1,000 Units (has no administration in time range)  clopidogrel (PLAVIX) tablet 75 mg (has no administration in time range)  escitalopram (LEXAPRO) tablet 20 mg (has no administration in time range)  pantoprazole (PROTONIX) EC tablet 40 mg (has no administration in time range)  ferrous sulfate tablet 325 mg (has no administration in time range)  mometasone-formoterol (DULERA) 200-5 MCG/ACT inhaler 2 puff (has no administration in time range)  folic acid (FOLVITE) tablet 1 mg (has no administration in time range)  furosemide (LASIX) tablet 40 mg (has no administration in time range)  gabapentin (NEURONTIN) capsule 600 mg (has no administration in time range)  isosorbide mononitrate (IMDUR) 24 hr tablet 30 mg (has no administration in time range)  losartan (COZAAR) tablet 50 mg (has no administration in time range)  magnesium oxide (MAG-OX) tablet 400 mg (has no administration in time range)  Melatonin TABS 10 mg (has no administration in time range)  metaxalone (SKELAXIN)  tablet 800 mg (has no administration in time range)  metoprolol succinate (TOPROL-XL) 24 hr tablet 50 mg (has no administration in time range)  nortriptyline (PAMELOR) capsule 10 mg (has no administration in time range)  oxyCODONE (OXYCONTIN) 12 hr tablet 15 mg (has no administration in time range)  potassium chloride (K-DUR,KLOR-CON) CR tablet 10 mEq (has no administration in time range)  rosuvastatin (CRESTOR) tablet 5 mg (has no administration in time range)  senna (SENOKOT) tablet 8.6-17.2 mg (has no administration in time range)  tapentadol (NUCYNTA) tablet 50 mg (has no administration in time range)  vitamin B-12 (CYANOCOBALAMIN) tablet 1,000 mcg (has no administration in time range)  aspirin EC tablet 81 mg (has no administration in time range)  albuterol (PROVENTIL) (2.5 MG/3ML) 0.083% nebulizer solution 2.5 mg (has no administration in time range)  metoCLOPramide (REGLAN) injection 10 mg (10 mg Intravenous Given 07/30/18 0006)  morphine 4 MG/ML injection 4 mg (4 mg Intravenous Given 07/30/18 0006)  iopamidol (ISOVUE-300) 61 % injection 100 mL (100 mLs Intravenous Contrast Given 07/30/18 0022)  ondansetron (ZOFRAN) injection 4 mg (4 mg Intravenous Given 07/30/18 0238)  cefTRIAXone (ROCEPHIN) 1 g in sodium chloride 0.9 % 100 mL IVPB (0 g  Intravenous Stopped 07/30/18 0322)     Initial Impression / Assessment and Plan / ED Course  I have reviewed the triage vital signs and the nursing notes.  Pertinent labs & imaging results that were available during my care of the patient were reviewed by me and considered in my medical decision making (see chart for details).     Patient presenting for evaluation of nausea, vomiting, abdominal pain.  Physical exam shows patient who appears uncomfortable, but in no acute distress. Abdominal exam concerning, as pt has very large ventral hernia with tenderness. Concern for incarceration vs obstruction vs infection. Labs show leukocytosis at 19, pt is  hypernatremic at 150. UA with infection, but doubt this is the cause of pt's sxs. Will start rocephin for UTI. Will give reglan and morphine for sx-control. CT abd/pelvis ordered.   CT abd/pelvis shows SBO. On reevaluation, pt is still vomiting, will give zofran and place NG tube. Will call for admission.   Discussed with Dr. Maudie Mercury from [facility] who will admit the pt. Will consult with gen surgey in the morning.   NG put out 1200 CC with significant improvement in sx-control.   Discussed with Dr. Hassell Done from general surgery. [facility] will consult while pt is admitted.   Final Clinical Impressions(s) / ED Diagnoses   Final diagnoses:  SBO (small bowel obstruction) ([place])  Urinary tract infection associated with indwelling urethral catheter, initial encounter [facility])    ED Discharge Orders    None       Franchot Heidelberg, PA-C 07/30/18 2446    Varney Biles, MD 07/30/18 0700

## 2018-07-31 LAB — BASIC METABOLIC PANEL
Anion gap: 9 (ref 5–15)
BUN: 21 mg/dL (ref 8–23)
CALCIUM: 8.6 mg/dL — AB (ref 8.9–10.3)
CO2: 28 mmol/L (ref 22–32)
CREATININE: 1.11 mg/dL — AB (ref 0.44–1.00)
Chloride: 100 mmol/L (ref 98–111)
GFR calc Af Amer: 58 mL/min — ABNORMAL LOW (ref >60)
GFR, EST NON AFRICAN AMERICAN: 50 mL/min — AB (ref >60)
GLUCOSE: 131 mg/dL — AB (ref 70–99)
Potassium: 3.9 mmol/L (ref 3.5–5.1)
Sodium: 137 mmol/L (ref 135–145)

## 2018-07-31 LAB — GLUCOSE, CAPILLARY
GLUCOSE-CAPILLARY: 114 mg/dL — AB (ref 70–99)
GLUCOSE-CAPILLARY: 115 mg/dL — AB (ref 70–99)
GLUCOSE-CAPILLARY: 123 mg/dL — AB (ref 70–99)
Glucose-Capillary: 135 mg/dL — ABNORMAL HIGH (ref 70–99)
Glucose-Capillary: 119 mg/dL — ABNORMAL HIGH (ref 70–99)
Glucose-Capillary: 87 mg/dL (ref 70–99)
Glucose-Capillary: 80 mg/dL (ref 70–99)

## 2018-07-31 MED ORDER — FAMOTIDINE IN NACL 20-0.9 MG/50ML-% IV SOLN
20.0000 mg | Freq: Two times a day (BID) | INTRAVENOUS | Status: DC
  Administered 2018-07-31 – 2018-08-07 (×15): 20 mg via INTRAVENOUS
  Filled 2018-07-31 (×15): qty 50

## 2018-07-31 MED ORDER — ALUM & MAG HYDROXIDE-SIMETH 200-200-20 MG/5ML PO SUSP
30.0000 mL | ORAL | Status: DC | PRN
  Administered 2018-07-31 – 2018-08-02 (×5): 30 mL via ORAL
  Filled 2018-07-31 (×5): qty 30

## 2018-07-31 NOTE — Consult Note (Signed)
Reason for Consult:SBO Referring Physician: Dr Feliz Ortiz  Lauren [name] is an 67 y.o. female.  HPI: Lauren [name] is an 67 y.o. female past medical history of hypertension, hyperlipidemia, diabetes type 2, with a history of CVA, COPD, ventral hernia status post endometrial cancer and hysterectomy.  She comes in complaining of abdominal pain that started around 4 AM Wed.  she denies any fever, chills, diarrhea.  CT scan of the abdomen and pelvis shows small bowel obstruction with a transition point within the ventral hernia.    Past Medical History:  Diagnosis Date  . Adenomatous colon polyp   . Anemia   . Anxiety   . AVM (arteriovenous malformation)    cecum  . CAD (coronary artery disease)   . Carotid artery occlusion   . Cataract    bil cateracts removed  . COPD (chronic obstructive pulmonary disease) (HCC)   . Cough   . DDD (degenerative disc disease), lumbar   . Depression   . Diabetes mellitus   . Elevated LFTs   . Endometrial ca (HCC)    endometrial ca dx 4/11  . GERD (gastroesophageal reflux disease)   . Gout 2015  . Hernia of flank   . Hydronephrosis   . Hyperlipidemia   . Hypertension   . Insomnia   . OAB (overactive bladder)   . Osteoarthritis   . Peripheral neuropathy    following chemotherapy  . Psoriasis   . Pulmonary nodule    since 2016  . Radiation 05/01/2011-06/11/11   5600 cGy 28 fxs/periaortic  . Stroke (HCC)    tia  . Thyroid nodule   . TIA (transient ischemic attack) 2015  . 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 Uterine cancer (HCC)   . Ventral hernia    postoperative since 2009    Past Surgical History:  Procedure Laterality Date  . ABDOMINAL AORTOGRAM W/LOWER EXTREMITY Right 06/28/2018   Procedure: ABDOMINAL AORTOGRAM W/LOWER EXTREMITY;  Surgeon: Cain, Brandon Christopher, MD;  Location: [facility];  Service: Cardiovascular;  Laterality: Right;  . ABDOMINAL HYSTERECTOMY  2009   for ca  . BACK  SURGERY  Aug/sept 2014   3 back surgeries  (Aug 2014 & Sept 2014)  . BREAST BIOPSY Left over 10 years ago   benign  . CHOLECYSTECTOMY     biliteral  . COLONOSCOPY  11/13/2014   negative except for a small cecal AVM and 1 polyp  . ENDARTERECTOMY Right 08/17/2015   Procedure: Right Carotid Endartarectomy with patch Angioplasty ;  Surgeon: James D Lawson, MD;  Location: [facility] OR;  Service: Vascular;  Laterality: Right;  . EYE SURGERY Right Aug. 2013   Cataract, lens implant  . EYE SURGERY Left Sept. 2013   Cataract, lens implant  . I & D of lumbar fusion /removal of hardware for chronic infection  Jan. 8, 2015  . right carotid endarterectomy  2016  . SPINE SURGERY      Family History  Problem Relation Age of Onset  . Stroke Mother   . Irritable bowel syndrome Mother   . Heart attack Father 42       died  . Heart disease Father        Heart Disease before age 60, rheumatic heart disease  . Hyperlipidemia Father   . Hypertension Father   . Diabetes Daughter   . Hypertension Daughter   . Irritable bowel syndrome Daughter   . Colon cancer Neg   Hx   . Esophageal cancer Neg Hx   . Pancreatic cancer Neg Hx   . Rectal cancer Neg Hx   . Stomach cancer Neg Hx   . Neuropathy Neg Hx     Social History:  reports that she quit smoking about 18 months ago. Her smoking use included cigarettes. She has a 4.00 pack-year smoking history. She has never used smokeless tobacco. She reports that she does not drink alcohol or use drugs.  Allergies:  Allergies  Allergen Reactions  . Ambien [Zolpidem Tartrate] Other (See Comments)    Per the pt, "it makes me go crazy"  . Sulfa Antibiotics Rash  . Elavil [Amitriptyline Hcl] Other (See Comments)    Gastritis  . Prozac [Fluoxetine Hcl] Anxiety and Other (See Comments)    irritability     Medications: I have reviewed the patient's current medications.  Results for orders placed or performed during the hospital encounter of 07/29/18 (from the past  48 hour(s))  Lipase, blood     Status: None   Collection Time: 07/29/18  9:04 PM  Result Value Ref Range   Lipase 23 11 - 51 U/L    Comment: Performed at [facility], [address] [place], [place] [address]  Comprehensive metabolic panel     Status: Abnormal   Collection Time: 07/29/18  9:04 PM  Result Value Ref Range   Sodium 150 (H) 135 - 145 mmol/L   Potassium 4.3 3.5 - 5.1 mmol/L   Chloride 107 98 - 111 mmol/L   CO2 29 22 - 32 mmol/L   Glucose, Bld 196 (H) 70 - 99 mg/dL   BUN 21 8 - 23 mg/dL   Creatinine, Ser 1.11 (H) 0.44 - 1.00 mg/dL   Calcium 9.9 8.9 - 10.3 mg/dL   Total Protein 7.7 6.5 - 8.1 g/dL   Albumin 4.0 3.5 - 5.0 g/dL   AST 24 15 - 41 U/L   ALT 25 0 - 44 U/L   Alkaline Phosphatase 62 38 - 126 U/L   Total Bilirubin 0.7 0.3 - 1.2 mg/dL   GFR calc non Af Amer 50 (L) >60 mL/min   GFR calc Af Amer 58 (L) >60 mL/min    Comment: (NOTE) The eGFR has been calculated using the CKD EPI equation. This calculation has not been validated in all clinical situations. eGFR's persistently <60 mL/min signify possible Chronic Kidney Disease.    Anion gap 14 5 - 15    Comment: Performed at [facility], [address] [place], [place] [address]  CBC     Status: Abnormal   Collection Time: 07/29/18  9:04 PM  Result Value Ref Range   WBC 19.1 (H) 4.0 - 10.5 K/uL   RBC 4.29 3.87 - 5.11 MIL/uL   Hemoglobin 12.4 12.0 - 15.0 g/dL   HCT 38.9 36.0 - 46.0 %   MCV 90.7 78.0 - 100.0 fL   MCH 28.9 26.0 - 34.0 pg   MCHC 31.9 30.0 - 36.0 g/dL   RDW 17.3 (H) 11.5 - 15.5 %   Platelets 438 (H) 150 - 400 K/uL    Comment: [facility] at  [facility], [address] [place], [place] [address]  Urinalysis, Routine w reflex microscopic     Status: Abnormal   Collection Time: 07/29/18  9:04 PM  Result Value Ref Range   Color, Urine YELLOW YELLOW   APPearance CLOUDY (A) CLEAR   Specific Gravity, Urine 1.020 1.005 - 1.030     pH 5.0 5.0  - 8.0   Glucose, UA NEGATIVE NEGATIVE mg/dL   Hgb urine dipstick NEGATIVE NEGATIVE   Bilirubin Urine NEGATIVE NEGATIVE   Ketones, ur 5 (A) NEGATIVE mg/dL   Protein, ur NEGATIVE NEGATIVE mg/dL   Nitrite NEGATIVE NEGATIVE   Leukocytes, UA MODERATE (A) NEGATIVE   RBC / HPF 6-10 0 - 5 RBC/hpf   WBC, UA 21-50 0 - 5 WBC/hpf   Bacteria, UA MANY (A) NONE SEEN   Mucus PRESENT    Hyaline Casts, UA PRESENT    Crystals PRESENT (A) NEGATIVE    Comment: Performed at [facility], [address] [place], [place] [address]  Glucose, capillary     Status: Abnormal   Collection Time: 07/30/18  4:02 AM  Result Value Ref Range   Glucose-Capillary 160 (H) 70 - 99 mg/dL  HIV antibody (Routine Testing)     Status: None   Collection Time: 07/30/18  6:10 AM  Result Value Ref Range   HIV Screen 4th Generation wRfx Non Reactive Non Reactive    Comment: (NOTE) Performed At: [facility] [address] [place], [place] [id number] Nagendra Sanjai MD Ph:[phone]   Comprehensive metabolic panel     Status: Abnormal   Collection Time: 07/30/18  6:10 AM  Result Value Ref Range   Sodium 142 135 - 145 mmol/L    Comment: DELTA CHECK NOTED REPEATED TO VERIFY    Potassium 3.6 3.5 - 5.1 mmol/L   Chloride 99 98 - 111 mmol/L   CO2 30 22 - 32 mmol/L   Glucose, Bld 133 (H) 70 - 99 mg/dL   BUN 24 (H) 8 - 23 mg/dL   Creatinine, Ser 1.25 (H) 0.44 - 1.00 mg/dL   Calcium 9.2 8.9 - 10.3 mg/dL   Total Protein 7.3 6.5 - 8.1 g/dL   Albumin 3.6 3.5 - 5.0 g/dL   AST 21 15 - 41 U/L   ALT 22 0 - 44 U/L   Alkaline Phosphatase 56 38 - 126 U/L   Total Bilirubin 0.6 0.3 - 1.2 mg/dL   GFR calc non Af Amer 43 (L) >60 mL/min   GFR calc Af Amer 50 (L) >60 mL/min    Comment: (NOTE) The eGFR has been calculated using the CKD EPI equation. This calculation has not been validated in all clinical situations. eGFR's persistently <60 mL/min signify possible Chronic Kidney Disease.    Anion gap 13 5 - 15     Comment: Performed at [facility], [address] [place], [place] [address]  CBC     Status: Abnormal   Collection Time: 07/30/18  6:10 AM  Result Value Ref Range   WBC 16.3 (H) 4.0 - 10.5 K/uL   RBC 4.16 3.87 - 5.11 MIL/uL   Hemoglobin 12.1 12.0 - 15.0 g/dL   HCT 37.9 36.0 - 46.0 %   MCV 91.1 78.0 - 100.0 fL   MCH 29.1 26.0 - 34.0 pg   MCHC 31.9 30.0 - 36.0 g/dL   RDW 17.6 (H) 11.5 - 15.5 %   Platelets 432 (H) 150 - 400 K/uL    Comment: Performed at [facility], [address] [place], [place] [address]  Glucose, capillary     Status: Abnormal   Collection Time: 07/30/18  7:50 AM  Result Value Ref Range   Glucose-Capillary 112 (H) 70 - 99 mg/dL  Glucose, capillary     Status: Abnormal   Collection Time: 07/30/18 12:31 PM  Result   Value Ref Range   Glucose-Capillary 149 (H) 70 - 99 mg/dL  Glucose, capillary     Status: Abnormal   Collection Time: 07/30/18  4:07 PM  Result Value Ref Range   Glucose-Capillary 168 (H) 70 - 99 mg/dL  Glucose, capillary     Status: Abnormal   Collection Time: 07/30/18  8:51 PM  Result Value Ref Range   Glucose-Capillary 163 (H) 70 - 99 mg/dL  Glucose, capillary     Status: Abnormal   Collection Time: 07/31/18 12:02 AM  Result Value Ref Range   Glucose-Capillary 115 (H) 70 - 99 mg/dL   Comment 1 Notify RN   Glucose, capillary     Status: Abnormal   Collection Time: 07/31/18  4:57 AM  Result Value Ref Range   Glucose-Capillary 123 (H) 70 - 99 mg/dL   Comment 1 Notify RN   Glucose, capillary     Status: Abnormal   Collection Time: 07/31/18  7:30 AM  Result Value Ref Range   Glucose-Capillary 114 (H) 70 - 99 mg/dL   Comment 1 Notify RN   Basic metabolic panel     Status: Abnormal   Collection Time: 07/31/18 12:11 PM  Result Value Ref Range   Sodium 137 135 - 145 mmol/L   Potassium 3.9 3.5 - 5.1 mmol/L   Chloride 100 98 - 111 mmol/L   CO2 28 22 - 32 mmol/L   Glucose, Bld 131 (H) 70 - 99 mg/dL    BUN 21 8 - 23 mg/dL   Creatinine, Ser 1.11 (H) 0.44 - 1.00 mg/dL   Calcium 8.6 (L) 8.9 - 10.3 mg/dL   GFR calc non Af Amer 50 (L) >60 mL/min   GFR calc Af Amer 58 (L) >60 mL/min    Comment: (NOTE) The eGFR has been calculated using the CKD EPI equation. This calculation has not been validated in all clinical situations. eGFR's persistently <60 mL/min signify possible Chronic Kidney Disease.    Anion gap 9 5 - 15    Comment: Performed at [facility], [address] [place], [place] [address]  Glucose, capillary     Status: Abnormal   Collection Time: 07/31/18 12:12 PM  Result Value Ref Range   Glucose-Capillary 135 (H) 70 - 99 mg/dL   Comment 1 Notify RN     Dg Abd 1 View  Result Date: 07/30/2018 CLINICAL DATA:  66-year-old female status post NG tube placement. EXAM: ABDOMEN - 1 VIEW COMPARISON:  0948 hours today. FINDINGS: Portable AP view at 1243 hours. The enteric tube has been advanced and the side hole now projects at the level of the distal gastric body. The tip is at the level of the antrum. Mild gas distended stomach. Negative lung bases. Stable cholecystectomy clips. Excreted IV contrast in nondilated right renal collecting system. Possible small volume of retained oral contrast in the lower abdominal small bowel or sigmoid colon. IMPRESSION: Enteric tube tip now at the gastric antrum, side hole at the distal body. Electronically Signed   By: H  Hall M.D.   On: 07/30/2018 13:51   Dg Abdomen 1 View  Result Date: 07/30/2018 CLINICAL DATA:  Nasogastric tube placement. EXAM: ABDOMEN - 1 VIEW COMPARISON:  CT abdomen and pelvis July 30, 2018 a FINDINGS: Nasogastric tube past mid stomach, distal tip out of field of view. Included bowel gas pattern is nondilated and nonobstructive. Contrast in the RIGHT collecting system, LEFT kidney demonstrated normal contrast excretion on today's CT. Surgical clips in the included   right abdomen compatible with cholecystectomy.  Surgical clips mid abdomen. IMPRESSION: 1. Nasogastric tube past mid stomach, distal tip out of field of view. Electronically Signed   By: Elon Alas M.D.   On: 07/30/2018 03:36   Ct Abdomen Pelvis W Contrast  Result Date: 07/30/2018 CLINICAL DATA:  Nausea, vomiting, abdominal pain. Hernia, rule out incarceration. EXAM: CT ABDOMEN AND PELVIS WITH CONTRAST TECHNIQUE: Multidetector CT imaging of the abdomen and pelvis was performed using the standard protocol following bolus administration of intravenous contrast. CONTRAST:  180m ISOVUE-300 IOPAMIDOL (ISOVUE-300) INJECTION 61% COMPARISON:  CT 08/21/2017 FINDINGS: Lower chest: Lung bases are clear. Hepatobiliary: No focal liver abnormality is seen. Status post cholecystectomy. No biliary dilatation. Pancreas: Parenchymal atrophy. No ductal dilatation or inflammation. Spleen: Normal in size without focal abnormality. Adrenals/Urinary Tract: No adrenal nodule. Decreased right hydronephrosis from prior exam with mild residual prominence of the renal pelvis. Resolved left hydronephrosis since prior exam. Cortical scarring throughout the left kidney. No perinephric edema. Both ureters are decompressed. Urinary bladder is decompressed by Foley catheter. Stomach/Bowel: Distended stomach and small bowel with transition point within a small bowel loop within the large lower ventral abdominal wall hernia. Transition point appreciated on image 30 series 6. This is not at the hernia neck, and is likely due to adhesion within the hernia sac. Hernia contains transverse colon and small bowel. No evidence pneumatosis or perforation. There is adjacent mesenteric edema and free fluid. More distal small bowel is decompressed. Small volume of stool throughout the colon without colonic wall thickening or inflammatory change. Normal appendix tentatively identified. Transverse colon enters large lower anterior abdominal wall hernia. Vascular/Lymphatic: Diffuse aorto bi-iliac  atherosclerosis. No aneurysm. No acute vascular finding. No adenopathy. Reproductive: Status post hysterectomy. No adnexal masses. Other: Large ventral abdominal wall hernia in the midline with associated rectus diastasis. Fluid within the hernia sac with associated mesenteric edema. Trace free fluid in the pelvis. No free air. Musculoskeletal: Postsurgical and degenerative change in the lower lumbar spine. Grade 2 anterolisthesis of L4 on L5, with fragmented appearance of the L5 vertebra and lumbosacral junction, stable in appearance from prior. No acute osseous abnormality. IMPRESSION: 1. Small bowel obstruction, with transition point within the large lower ventral abdominal wall hernia. Transition occurs within the mesentery located in the hernia sac rather than the hernia neck suggesting adhesions as source of obstruction. Mesenteric edema and free fluid but no perforation. 2. Resolved left hydronephrosis and improved right hydronephrosis from CT 1 year ago. 3.  Aortic Atherosclerosis (ICD10-I70.0). Electronically Signed   By: MJeb LeveringM.D.   On: 07/30/2018 01:10   Dg Abd Portable 1v-small Bowel Obstruction Protocol-initial, 8 Hr Delay  Result Date: 07/30/2018 CLINICAL DATA:  Small bowel obstruction, 8 hour delayed image. EXAM: PORTABLE ABDOMEN - 1 VIEW COMPARISON:  Radiographs and CT earlier this day. FINDINGS: Portable AP view of the lower abdomen demonstrates no enteric contrast in the colon. Probable enteric contrast in the small bowel in the central and right mid abdomen. Enteric tube tip and side-port in the stomach. Patient refused additional imaging of the upper abdomen. IMPRESSION: No enteric contrast visualized within the colon. Recommend 24 hour delayed film. Electronically Signed   By: MJeb LeveringM.D.   On: 07/30/2018 22:45   Dg Abd Portable 1v-small Bowel Protocol-position Verification  Result Date: 07/30/2018 CLINICAL DATA:  Check nasogastric catheter placement EXAM: PORTABLE  ABDOMEN - 1 VIEW COMPARISON:  07/30/2018 FINDINGS: Nasogastric catheter is again identified been withdrawn somewhat. The proximal side port  lies in the distal esophagus. This should be advanced several cm. IMPRESSION: Nasogastric catheter as described. This should be advanced several cm. Electronically Signed   By: Mark  Lukens M.D.   On: 07/30/2018 10:15    Review of Systems  Constitutional: Negative for chills and fever.  HENT: Negative for hearing loss.   Eyes: Negative for blurred vision.  Respiratory: Negative for cough and shortness of breath.   Cardiovascular: Negative for chest pain and palpitations.  Gastrointestinal: Positive for abdominal pain, nausea and vomiting. Negative for constipation and diarrhea.  Genitourinary: Negative for dysuria, frequency and urgency.  Skin: Negative for itching and rash.   Blood pressure 138/63, pulse 73, temperature 98.3 F (36.8 C), temperature source Oral, resp. rate 16, height 5' 6" (1.676 m), weight 91.1 kg (200 lb 13.4 oz), SpO2 97 %. Physical Exam  Constitutional: She is oriented to person, place, and time. She appears well-developed and well-nourished.  HENT:  Head: Normocephalic.  Eyes: Pupils are equal, round, and reactive to light. Conjunctivae and EOM are normal.  Neck: Normal range of motion. Neck supple.  Cardiovascular: Normal rate and regular rhythm.  Respiratory: Effort normal. No respiratory distress.  GI: Soft. She exhibits distension. There is no tenderness.  Musculoskeletal: Normal range of motion.  Neurological: She is alert and oriented to person, place, and time.  Skin: Skin is warm and dry.    Assessment/Plan: Large ventral hernia.  Very poor surgical candidate.  SBO, NG in place.  Cont NPO.  Hold plavix in case surgery is needed.    ,  C. 07/31/2018, 2:04 PM     

## 2018-07-31 NOTE — Progress Notes (Signed)
[facility] PROGRESS NOTE    Progress Note  Lauren [name]  WLS:[id number] DOB: 1951-06-14 DOA: 07/29/2018 PCP: [name], MD     Brief Narrative:   Lauren [name] is an 67 y.o. female past medical history of hypertension, hyperlipidemia, diabetes mellitus type 2, with a history of CVA, COPD psoriasis ventral hernia status post endometrial cancer comes in complaining of abdominal pain that started around 4 AM the day prior to admission she denies any fever chills diarrhea black stools bright red blood per rectum, CT scan of the abdomen and pelvis shows small bowel obstruction with a transition point lower than the ventral hernia  Assessment/Plan:   SBO (small bowel obstruction) ([place]): Keep the patient n.p.o. continue IV fluids. Continue NG tube with intermittent suction. Replete electrolytes as needed. We will get a small bowel protocol. I have called surgery and awaiting recommendations.  Patient is not passing gas is not having bowel movements.  Hypovolemic hypernatremia: Resolved with IV fluid hydration change IV fluids to half-normal saline with potassium. Basic metabolic panel is pending.  Diabetes mellitus without complication ([place]) Fairly controlled continue check CBGs, patient is n.p.o. on D5 half-normal with potassium supplementation.  Possible acute lower UTI UA showed many bacteria 21-50 white blood cells started empirically on IV Rocephin. Urine cultures were not sent on admission we will treat empirically for 3 days.  DVT prophylaxis: lovenxo Family Communication:none Disposition Plan/Barrier to D/C: home in 3 days Code Status:     Code Status Orders  (From admission, onward)        Start     Ordered   07/30/18 0247  Full code  Continuous     07/30/18 0252    Code Status History    Date Active Date Inactive Code Status Order ID Comments User Context   06/28/2018 1421 06/28/2018 1953 Full Code [id number]  [name], MD  Inpatient   08/22/2017 0309 08/25/2017 1738 Full Code [id number]  [name], MD ED   02/02/2017 2358 02/05/2017 1849 Full Code [id number]  [name], DO ED   08/17/2015 1211 08/18/2015 1155 Full Code [id number]  [name], PA-C Inpatient        IV Access:    Peripheral IV   Procedures and diagnostic studies:   Dg Abd 1 View  Result Date: 07/30/2018 CLINICAL DATA:  67 year old female status post NG tube placement. EXAM: ABDOMEN - 1 VIEW COMPARISON:  0948 hours today. FINDINGS: Portable AP view at 1243 hours. The enteric tube has been advanced and the side hole now projects at the level of the distal gastric body. The tip is at the level of the antrum. Mild gas distended stomach. Negative lung bases. Stable cholecystectomy clips. Excreted IV contrast in nondilated right renal collecting system. Possible small volume of retained oral contrast in the lower abdominal small bowel or sigmoid colon. IMPRESSION: Enteric tube tip now at the gastric antrum, side hole at the distal body. Electronically Signed   By: Genevie Ann M.D.   On: 07/30/2018 13:51   Dg Abdomen 1 View  Result Date: 07/30/2018 CLINICAL DATA:  Nasogastric tube placement. EXAM: ABDOMEN - 1 VIEW COMPARISON:  CT abdomen and pelvis July 30, 2018 a FINDINGS: Nasogastric tube past mid stomach, distal tip out of field of view. Included bowel gas pattern is nondilated and nonobstructive. Contrast in the RIGHT collecting system, LEFT kidney demonstrated normal contrast excretion on today's CT. Surgical clips in the included right abdomen compatible with cholecystectomy. Surgical clips mid  abdomen. IMPRESSION: 1. Nasogastric tube past mid stomach, distal tip out of field of view. Electronically Signed   By: Elon Alas M.D.   On: 07/30/2018 03:36   Ct Abdomen Pelvis W Contrast  Result Date: 07/30/2018 CLINICAL DATA:  Nausea, vomiting, abdominal pain. Hernia, rule out incarceration. EXAM: CT ABDOMEN AND PELVIS WITH CONTRAST  TECHNIQUE: Multidetector CT imaging of the abdomen and pelvis was performed using the standard protocol following bolus administration of intravenous contrast. CONTRAST:  174mL ISOVUE-300 IOPAMIDOL (ISOVUE-300) INJECTION 61% COMPARISON:  CT 08/21/2017 FINDINGS: Lower chest: Lung bases are clear. Hepatobiliary: No focal liver abnormality is seen. Status post cholecystectomy. No biliary dilatation. Pancreas: Parenchymal atrophy. No ductal dilatation or inflammation. Spleen: Normal in size without focal abnormality. Adrenals/Urinary Tract: No adrenal nodule. Decreased right hydronephrosis from prior exam with mild residual prominence of the renal pelvis. Resolved left hydronephrosis since prior exam. Cortical scarring throughout the left kidney. No perinephric edema. Both ureters are decompressed. Urinary bladder is decompressed by Foley catheter. Stomach/Bowel: Distended stomach and small bowel with transition point within a small bowel loop within the large lower ventral abdominal wall hernia. Transition point appreciated on image 30 series 6. This is not at the hernia neck, and is likely due to adhesion within the hernia sac. Hernia contains transverse colon and small bowel. No evidence pneumatosis or perforation. There is adjacent mesenteric edema and free fluid. More distal small bowel is decompressed. Small volume of stool throughout the colon without colonic wall thickening or inflammatory change. Normal appendix tentatively identified. Transverse colon enters large lower anterior abdominal wall hernia. Vascular/Lymphatic: Diffuse aorto bi-iliac atherosclerosis. No aneurysm. No acute vascular finding. No adenopathy. Reproductive: Status post hysterectomy. No adnexal masses. Other: Large ventral abdominal wall hernia in the midline with associated rectus diastasis. Fluid within the hernia sac with associated mesenteric edema. Trace free fluid in the pelvis. No free air. Musculoskeletal: Postsurgical and  degenerative change in the lower lumbar spine. Grade 2 anterolisthesis of L4 on L5, with fragmented appearance of the L5 vertebra and lumbosacral junction, stable in appearance from prior. No acute osseous abnormality. IMPRESSION: 1. Small bowel obstruction, with transition point within the large lower ventral abdominal wall hernia. Transition occurs within the mesentery located in the hernia sac rather than the hernia neck suggesting adhesions as source of obstruction. Mesenteric edema and free fluid but no perforation. 2. Resolved left hydronephrosis and improved right hydronephrosis from CT 1 year ago. 3.  Aortic Atherosclerosis (ICD10-I70.0). Electronically Signed   By: Jeb Levering M.D.   On: 07/30/2018 01:10   Dg Abd Portable 1v-small Bowel Obstruction Protocol-initial, 8 Hr Delay  Result Date: 07/30/2018 CLINICAL DATA:  Small bowel obstruction, 8 hour delayed image. EXAM: PORTABLE ABDOMEN - 1 VIEW COMPARISON:  Radiographs and CT earlier this day. FINDINGS: Portable AP view of the lower abdomen demonstrates no enteric contrast in the colon. Probable enteric contrast in the small bowel in the central and right mid abdomen. Enteric tube tip and side-port in the stomach. Patient refused additional imaging of the upper abdomen. IMPRESSION: No enteric contrast visualized within the colon. Recommend 24 hour delayed film. Electronically Signed   By: Jeb Levering M.D.   On: 07/30/2018 22:45   Dg Abd Portable 1v-small Bowel Protocol-position Verification  Result Date: 07/30/2018 CLINICAL DATA:  Check nasogastric catheter placement EXAM: PORTABLE ABDOMEN - 1 VIEW COMPARISON:  07/30/2018 FINDINGS: Nasogastric catheter is again identified been withdrawn somewhat. The proximal side port lies in the distal esophagus. This should be advanced  several cm. IMPRESSION: Nasogastric catheter as described. This should be advanced several cm. Electronically Signed   By: Inez Catalina M.D.   On: 07/30/2018 10:15      Medical Consultants:    None.  Anti-Infectives:   rocephin  Subjective:    Lauren [name] relates some indigestion with burning, she also denies any bowel movements she is not passing gas. Objective:    Vitals:   07/30/18 1224 07/30/18 2143 07/31/18 0454 07/31/18 0500  BP: (!) 173/71 (!) 149/62 (!) 141/62   Pulse: 90 85 74   Resp: 16 18 16    Temp: 99.1 F (37.3 C) 99.3 F (37.4 C) 98.1 F (36.7 C)   TempSrc: Oral Oral    SpO2: 96% 93% 98%   Weight:    91.1 kg (200 lb 13.4 oz)  Height:        Intake/Output Summary (Last 24 hours) at 07/31/2018 1156 Last data filed at 07/31/2018 1000 Gross per 24 hour  Intake 1624.52 ml  Output 3425 ml  Net -1800.48 ml   Filed Weights   07/30/18 0400 07/31/18 0500  Weight: 91.8 kg (202 lb 6.1 oz) 91.1 kg (200 lb 13.4 oz)    Exam: General exam: In no acute distress. Respiratory system: Good air movement and clear to auscultation. Cardiovascular system: S1 & S2 heard, RRR.  Gastrointestinal system: Abdomen is soft with ventral hernia nontender to palpation no rebound or guarding. Central nervous system: Alert and oriented. No focal neurological deficits. Extremities: No pedal edema. Skin: No rashes, lesions or ulcers Psychiatry: Judgement and insight appear normal. Mood & affect appropriate.    Data Reviewed:    Labs: Basic Metabolic Panel: Recent Labs  Lab 07/29/18 2104 07/30/18 0610  NA 150* 142  K 4.3 3.6  CL 107 99  CO2 29 30  GLUCOSE 196* 133*  BUN 21 24*  CREATININE 1.11* 1.25*  CALCIUM 9.9 9.2   GFR Estimated Creatinine Clearance: 49.6 mL/min (A) (by C-G formula based on SCr of 1.25 mg/dL (H)). Liver Function Tests: Recent Labs  Lab 07/29/18 2104 07/30/18 0610  AST 24 21  ALT 25 22  ALKPHOS 62 56  BILITOT 0.7 0.6  PROT 7.7 7.3  ALBUMIN 4.0 3.6   Recent Labs  Lab 07/29/18 2104  LIPASE 23   No results for input(s): AMMONIA in the last 168 hours. Coagulation profile No results for  input(s): INR, PROTIME in the last 168 hours.  CBC: Recent Labs  Lab 07/29/18 2104 07/30/18 0610  WBC 19.1* 16.3*  HGB 12.4 12.1  HCT 38.9 37.9  MCV 90.7 91.1  PLT 438* 432*   Cardiac Enzymes: No results for input(s): CKTOTAL, CKMB, CKMBINDEX, TROPONINI in the last 168 hours. BNP (last 3 results) No results for input(s): PROBNP in the last 8760 hours. CBG: Recent Labs  Lab 07/30/18 1607 07/30/18 2051 07/31/18 0002 07/31/18 0457 07/31/18 0730  GLUCAP 168* 163* 115* 123* 114*   D-Dimer: No results for input(s): DDIMER in the last 72 hours. Hgb A1c: No results for input(s): HGBA1C in the last 72 hours. Lipid Profile: No results for input(s): CHOL, HDL, LDLCALC, TRIG, CHOLHDL, LDLDIRECT in the last 72 hours. Thyroid function studies: No results for input(s): TSH, T4TOTAL, T3FREE, THYROIDAB in the last 72 hours.  Invalid input(s): FREET3 Anemia work up: No results for input(s): VITAMINB12, FOLATE, FERRITIN, TIBC, IRON, RETICCTPCT in the last 72 hours. Sepsis Labs: Recent Labs  Lab 07/29/18 2104 07/30/18 0610  WBC 19.1* 16.3*   Microbiology No  results found for this or any previous visit (from the past 240 hour(s)).   Medications:   . albuterol  2.5 mg Nebulization Daily  . amLODipine  5 mg Oral Daily  . aspirin EC  81 mg Oral Daily  . cholecalciferol  1,000 Units Oral Daily  . clopidogrel  75 mg Oral Daily  . enoxaparin (LOVENOX) injection  40 mg Subcutaneous Q24H  . escitalopram  20 mg Oral Daily  . ferrous sulfate  325 mg Oral Daily  . folic acid  1 mg Oral Daily  . furosemide  40 mg Oral Daily  . gabapentin  600 mg Oral TID  . Gerhardt's butt cream   Topical BID  . insulin aspart  0-9 Units Subcutaneous Q4H  . isosorbide mononitrate  30 mg Oral Daily  . losartan  50 mg Oral Daily  . magnesium oxide  400 mg Oral Daily  . Melatonin  10 mg Oral QHS  . metaxalone  800 mg Oral 3 times per day  . metoprolol succinate  50 mg Oral Daily  .  mometasone-formoterol  2 puff Inhalation BID  . nortriptyline  10 mg Oral QHS  . oxyCODONE  15 mg Oral Q12H  . pantoprazole  40 mg Oral Daily  . potassium chloride  10 mEq Oral BID  . rosuvastatin  5 mg Oral QHS  . senna  1-2 tablet Oral Daily  . tapentadol  50 mg Oral Q6H  . vitamin B-12  1,000 mcg Oral Daily   Continuous Infusions: . cefTRIAXone (ROCEPHIN)  IV Stopped (07/30/18 2327)  . dextrose 5 % and 0.45 % NaCl with KCl 40 mEq/L 100 mL/hr at 07/31/18 0915      LOS: 1 day   [facility] Pager [phone]  *Please refer to [email or web address], password [email or web address] to get updated schedule on who will round on this patient, as hospitalists switch teams weekly. If 7PM-7AM, please contact night-coverage at [email or web address], password [email or web address] for any overnight needs.  07/31/2018, 11:56 AM

## 2018-07-31 NOTE — Plan of Care (Signed)
  Problem: Education: Goal: Knowledge of General Education information will improve Description: Including pain rating scale, medication(s)/side effects and non-pharmacologic comfort measures Outcome: Progressing   Problem: Clinical Measurements: Goal: Ability to maintain clinical measurements within normal limits will improve Outcome: Progressing Goal: Will remain free from infection Outcome: Progressing Goal: Diagnostic test results will improve Outcome: Progressing Goal: Respiratory complications will improve Outcome: Progressing Goal: Cardiovascular complication will be avoided Outcome: Progressing   Problem: Pain Managment: Goal: General experience of comfort will improve Outcome: Progressing   Problem: Safety: Goal: Ability to remain free from injury will improve Outcome: Progressing   

## 2018-07-31 NOTE — Plan of Care (Signed)
  Problem: Education: Goal: Knowledge of General Education information will improve Description: Including pain rating scale, medication(s)/side effects and non-pharmacologic comfort measures Outcome: Progressing   Problem: Pain Managment: Goal: General experience of comfort will improve Outcome: Progressing   Problem: Safety: Goal: Ability to remain free from injury will improve Outcome: Progressing   

## 2018-08-01 ENCOUNTER — Inpatient Hospital Stay (HOSPITAL_COMMUNITY): Payer: Medicare Other

## 2018-08-01 LAB — CBC
HCT: 33.8 % — ABNORMAL LOW (ref 36.0–46.0)
HEMOGLOBIN: 10.5 g/dL — AB (ref 12.0–15.0)
MCH: 28.6 pg (ref 26.0–34.0)
MCHC: 31.1 g/dL (ref 30.0–36.0)
MCV: 92.1 fL (ref 78.0–100.0)
PLATELETS: 381 10*3/uL (ref 150–400)
RBC: 3.67 MIL/uL — AB (ref 3.87–5.11)
RDW: 16.8 % — ABNORMAL HIGH (ref 11.5–15.5)
WBC: 12.6 10*3/uL — AB (ref 4.0–10.5)

## 2018-08-01 LAB — BASIC METABOLIC PANEL
ANION GAP: 11 (ref 5–15)
BUN: 16 mg/dL (ref 8–23)
CALCIUM: 8.7 mg/dL — AB (ref 8.9–10.3)
CHLORIDE: 101 mmol/L (ref 98–111)
CO2: 29 mmol/L (ref 22–32)
Creatinine, Ser: 1.17 mg/dL — ABNORMAL HIGH (ref 0.44–1.00)
GFR calc Af Amer: 55 mL/min — ABNORMAL LOW (ref >60)
GFR, EST NON AFRICAN AMERICAN: 47 mL/min — AB (ref >60)
GLUCOSE: 115 mg/dL — AB (ref 70–99)
Potassium: 4.2 mmol/L (ref 3.5–5.1)
Sodium: 141 mmol/L (ref 135–145)

## 2018-08-01 LAB — GLUCOSE, CAPILLARY
GLUCOSE-CAPILLARY: 123 mg/dL — AB (ref 70–99)
GLUCOSE-CAPILLARY: 127 mg/dL — AB (ref 70–99)
GLUCOSE-CAPILLARY: 134 mg/dL — AB (ref 70–99)
GLUCOSE-CAPILLARY: 88 mg/dL (ref 70–99)
Glucose-Capillary: 102 mg/dL — ABNORMAL HIGH (ref 70–99)
Glucose-Capillary: 153 mg/dL — ABNORMAL HIGH (ref 70–99)

## 2018-08-01 MED ORDER — PANTOPRAZOLE SODIUM 40 MG PO TBEC
40.0000 mg | DELAYED_RELEASE_TABLET | Freq: Two times a day (BID) | ORAL | Status: DC
  Administered 2018-08-01 – 2018-08-02 (×4): 40 mg via ORAL
  Filled 2018-08-01 (×4): qty 1

## 2018-08-01 MED ORDER — PHENOL 1.4 % MT LIQD
1.0000 | OROMUCOSAL | Status: DC | PRN
  Administered 2018-08-01 (×2): 1 via OROMUCOSAL
  Filled 2018-08-01: qty 177

## 2018-08-01 NOTE — Plan of Care (Signed)
  Problem: Education: Goal: Knowledge of General Education information will improve Description: Including pain rating scale, medication(s)/side effects and non-pharmacologic comfort measures Outcome: Progressing   Problem: Pain Managment: Goal: General experience of comfort will improve Outcome: Progressing   Problem: Safety: Goal: Ability to remain free from injury will improve Outcome: Progressing   

## 2018-08-01 NOTE — Progress Notes (Signed)
Assessment & Plan: HD#2 - small bowel obstruction due to incarcerated ventral incisional hernia  Continue NPO, NG, IV hydration  Continue to hold Plavix  Encouraged OOB, ambulation, up to chair  Per medical service  Will follow with you.  Poor operative candidate for repair.  Has had large hernia for 10+ years.  Has been evaluated at least twice in our practice and at [facility] in specialty clinic for possible repair.  High risk of complication and recurrence.  Hopefully will resolve with medical management.        Earnstine Regal, MD, [facility]       Office: [phone]   Chief Complaint: Small bowel obstruction  Subjective: Patient in bed, more comfortable.  Limited ability to walk - uses walker.  Objective: Vital signs in last 24 hours: Temp:  [98.3 F (36.8 C)-98.7 F (37.1 C)] 98.7 F (37.1 C) (08/04 0413) Pulse Rate:  [69-75] 75 (08/04 0413) Resp:  [16-18] 18 (08/04 0413) BP: (126-150)/(63-84) 126/84 (08/04 0413) SpO2:  [97 %-98 %] 98 % (08/04 0413) Weight:  [92.9 kg (204 lb 12.9 oz)] 92.9 kg (204 lb 12.9 oz) (08/04 0500) Last BM Date: 07/30/18  Intake/Output from previous day: 08/03 0701 - 08/04 0700 In: 2366.1 [P.O.:200; I.V.:1966.1; IV Piggyback:199.9] Out: 1625 [Urine:350; Emesis/NG SWFUXN:2355] Intake/Output this shift: No intake/output data recorded.  Physical Exam: HEENT - sclerae clear, mucous membranes moist Neck - soft Chest - clear bilaterally Cor - RRR, systolic murmur Abdomen - large, obese; large incisional hernia, mildly tender, not reducible Neuro - alert & oriented, no focal deficits  Lab Results:  Recent Labs    07/30/18 0610 08/01/18 0434  WBC 16.3* 12.6*  HGB 12.1 10.5*  HCT 37.9 33.8*  PLT 432* 381   BMET Recent Labs    07/31/18 1211 08/01/18 0434  NA 137 141  K 3.9 4.2  CL 100 101  CO2 28 29  GLUCOSE 131* 115*  BUN 21 16  CREATININE 1.11* 1.17*  CALCIUM 8.6* 8.7*   PT/INR No  results for input(s): LABPROT, INR in the last 72 hours. Comprehensive Metabolic Panel:    Component Value Date/Time   NA 141 08/01/2018 0434   NA 137 07/31/2018 1211   NA 142 01/28/2018 1035   K 4.2 08/01/2018 0434   K 3.9 07/31/2018 1211   CL 101 08/01/2018 0434   CL 100 07/31/2018 1211   CO2 29 08/01/2018 0434   CO2 28 07/31/2018 1211   BUN 16 08/01/2018 0434   BUN 21 07/31/2018 1211   BUN 19 01/28/2018 1035   BUN 14.7 07/18/2014 1142   BUN 30.0 (H) 09/24/2012 1025   CREATININE 1.17 (H) 08/01/2018 0434   CREATININE 1.11 (H) 07/31/2018 1211   CREATININE 1.0 07/18/2014 1142   CREATININE 1.2 (H) 09/24/2012 1025   GLUCOSE 115 (H) 08/01/2018 0434   GLUCOSE 131 (H) 07/31/2018 1211   CALCIUM 8.7 (L) 08/01/2018 0434   CALCIUM 8.6 (L) 07/31/2018 1211   AST 21 07/30/2018 0610   AST 24 07/29/2018 2104   ALT 22 07/30/2018 0610   ALT 25 07/29/2018 2104   ALKPHOS 56 07/30/2018 0610   ALKPHOS 62 07/29/2018 2104   BILITOT 0.6 07/30/2018 0610   BILITOT 0.7 07/29/2018 2104   PROT 7.3 07/30/2018 0610   PROT 7.7 07/29/2018 2104   PROT 6.5 03/10/2017 1656   ALBUMIN 3.6 07/30/2018 0610   ALBUMIN 4.0 07/29/2018 2104  Studies/Results: Dg Abd 1 View  Result Date: 07/30/2018 CLINICAL DATA:  67 year old Lauren [name] status post NG tube placement. EXAM: ABDOMEN - 1 VIEW COMPARISON:  0948 hours today. FINDINGS: Portable AP view at 1243 hours. The enteric tube has been advanced and the side hole now projects at the level of the distal gastric body. The tip is at the level of the antrum. Mild gas distended stomach. Negative lung bases. Stable cholecystectomy clips. Excreted IV contrast in nondilated right renal collecting system. Possible small volume of retained oral contrast in the lower abdominal small bowel or sigmoid colon. IMPRESSION: Enteric tube tip now at the gastric antrum, side hole at the distal body. Electronically Signed   By: Genevie Ann M.D.   On: 07/30/2018 13:51   Dg Abd Portable 1v-small  Bowel Obstruction Protocol-initial, 8 Hr Delay  Result Date: 07/30/2018 CLINICAL DATA:  Small bowel obstruction, 8 hour delayed image. EXAM: PORTABLE ABDOMEN - 1 VIEW COMPARISON:  Radiographs and CT earlier this day. FINDINGS: Portable AP view of the lower abdomen demonstrates no enteric contrast in the colon. Probable enteric contrast in the small bowel in the central and right mid abdomen. Enteric tube tip and side-port in the stomach. Patient refused additional imaging of the upper abdomen. IMPRESSION: No enteric contrast visualized within the colon. Recommend 24 hour delayed film. Electronically Signed   By: Jeb Levering M.D.   On: 07/30/2018 22:45   Dg Abd Portable 1v-small Bowel Protocol-position Verification  Result Date: 07/30/2018 CLINICAL DATA:  Check nasogastric catheter placement EXAM: PORTABLE ABDOMEN - 1 VIEW COMPARISON:  07/30/2018 FINDINGS: Nasogastric catheter is again identified been withdrawn somewhat. The proximal side port lies in the distal esophagus. This should be advanced several cm. IMPRESSION: Nasogastric catheter as described. This should be advanced several cm. Electronically Signed   By: Inez Catalina M.D.   On: 07/30/2018 10:15      Adair Lauderback M 08/01/2018  Patient ID: Lauren [name], Lauren [name]   DOB: 11-Jun-1951, 67 y.o.   MRN: [id number]

## 2018-08-01 NOTE — Progress Notes (Signed)
[facility] PROGRESS NOTE    Progress Note  Lauren [name]  [id number] DOB: Apr 07, 1951 DOA: 07/29/2018 PCP: [name], MD     Brief Narrative:   Lauren [name] is an 67 y.o. female past medical history of hypertension, hyperlipidemia, diabetes mellitus type 2, with a history of CVA, COPD psoriasis ventral hernia status post endometrial cancer comes in complaining of abdominal pain that started around 4 AM the day prior to admission she denies any fever chills diarrhea black stools bright red blood per rectum, CT scan of the abdomen and pelvis shows small bowel obstruction with a transition point lower than the ventral hernia  Assessment/Plan:   SBO (small bowel obstruction) ([place]): Keep the patient n.p.o. continue IV fluids. Continue NG tube with intermittent suction. Replete electrolytes as needed. We will get a small bowel protocol. Surgery deemed her high risk surgical candidate, they would like to continue conservative management, she is not passing gas or having bowel movement. We will get a KUB. Start on Protonix twice daily.  Hypovolemic hypernatremia: Resolved with IV fluid hydration change IV fluids to half-normal saline with potassium. Basic metabolic panel is pending.  Diabetes mellitus without complication ([place]) Fairly controlled continue check CBGs, patient is n.p.o. on D5 half-normal with potassium supplementation.  Possible acute lower UTI UA showed many bacteria 21-50 white blood cells started empirically on IV Rocephin. Urine cultures were not sent on admission we will treat empirically for 3 days.  DVT prophylaxis: lovenxo Family Communication:none Disposition Plan/Barrier to D/C: home in 3 days Code Status:     Code Status Orders  (From admission, onward)        Start     Ordered   07/30/18 0247  Full code  Continuous     07/30/18 0252    Code Status History    Date Active Date Inactive Code Status Order ID Comments User  Context   06/28/2018 1421 06/28/2018 1953 Full Code [id number]  [name], MD Inpatient   08/22/2017 0309 08/25/2017 1738 Full Code [id number]  [name], MD ED   02/02/2017 2358 02/05/2017 1849 Full Code [id number]  [name], DO ED   08/17/2015 1211 08/18/2015 1155 Full Code [id number]  [name], PA-C Inpatient        IV Access:    Peripheral IV   Procedures and diagnostic studies:   Dg Abd 1 View  Result Date: 07/30/2018 CLINICAL DATA:  67 year old female status post NG tube placement. EXAM: ABDOMEN - 1 VIEW COMPARISON:  0948 hours today. FINDINGS: Portable AP view at 1243 hours. The enteric tube has been advanced and the side hole now projects at the level of the distal gastric body. The tip is at the level of the antrum. Mild gas distended stomach. Negative lung bases. Stable cholecystectomy clips. Excreted IV contrast in nondilated right renal collecting system. Possible small volume of retained oral contrast in the lower abdominal small bowel or sigmoid colon. IMPRESSION: Enteric tube tip now at the gastric antrum, side hole at the distal body. Electronically Signed   By: Genevie Ann M.D.   On: 07/30/2018 13:51   Dg Abd Portable 1v-small Bowel Obstruction Protocol-initial, 8 Hr Delay  Result Date: 07/30/2018 CLINICAL DATA:  Small bowel obstruction, 8 hour delayed image. EXAM: PORTABLE ABDOMEN - 1 VIEW COMPARISON:  Radiographs and CT earlier this day. FINDINGS: Portable AP view of the lower abdomen demonstrates no enteric contrast in the colon. Probable enteric contrast in the small bowel in the central  and right mid abdomen. Enteric tube tip and side-port in the stomach. Patient refused additional imaging of the upper abdomen. IMPRESSION: No enteric contrast visualized within the colon. Recommend 24 hour delayed film. Electronically Signed   By: Jeb Levering M.D.   On: 07/30/2018 22:45     Medical Consultants:    None.  Anti-Infectives:    rocephin  Subjective:    [name] relates some indigestion with burning, she also denies any bowel movements she is not passing gas. Objective:    Vitals:   07/31/18 1401 07/31/18 2202 08/01/18 0413 08/01/18 0500  BP: 138/63 (!) 150/75 126/84   Pulse: 73 69 75   Resp: 16 18 18    Temp: 98.3 F (36.8 C) 98.7 F (37.1 C) 98.7 F (37.1 C)   TempSrc: Oral Oral Oral   SpO2: 97% 97% 98%   Weight:    92.9 kg (204 lb 12.9 oz)  Height:        Intake/Output Summary (Last 24 hours) at 08/01/2018 1050 Last data filed at 08/01/2018 0600 Gross per 24 hour  Intake 2191.72 ml  Output 1425 ml  Net 766.72 ml   Filed Weights   07/30/18 0400 07/31/18 0500 08/01/18 0500  Weight: 91.8 kg (202 lb 6.1 oz) 91.1 kg (200 lb 13.4 oz) 92.9 kg (204 lb 12.9 oz)    Exam: General exam: In no acute distress. Respiratory system: Good air movement and clear to auscultation. Cardiovascular system: S1 & S2 heard, RRR.  Gastrointestinal system: Abdomen is soft with ventral hernia nontender to palpation no rebound or guarding. Central nervous system: Alert and oriented. No focal neurological deficits. Extremities: No pedal edema. Skin: No rashes, lesions or ulcers Psychiatry: Judgement and insight appear normal. Mood & affect appropriate.    Data Reviewed:    Labs: Basic Metabolic Panel: Recent Labs  Lab 07/29/18 2104 07/30/18 0610 07/31/18 1211 08/01/18 0434  NA 150* 142 137 141  K 4.3 3.6 3.9 4.2  CL 107 99 100 101  CO2 29 30 28 29   GLUCOSE 196* 133* 131* 115*  BUN 21 24* 21 16  CREATININE 1.11* 1.25* 1.11* 1.17*  CALCIUM 9.9 9.2 8.6* 8.7*   GFR Estimated Creatinine Clearance: 53.5 mL/min (A) (by C-G formula based on SCr of 1.17 mg/dL (H)). Liver Function Tests: Recent Labs  Lab 07/29/18 2104 07/30/18 0610  AST 24 21  ALT 25 22  ALKPHOS 62 56  BILITOT 0.7 0.6  PROT 7.7 7.3  ALBUMIN 4.0 3.6   Recent Labs  Lab 07/29/18 2104  LIPASE 23   No results for input(s):  AMMONIA in the last 168 hours. Coagulation profile No results for input(s): INR, PROTIME in the last 168 hours.  CBC: Recent Labs  Lab 07/29/18 2104 07/30/18 0610 08/01/18 0434  WBC 19.1* 16.3* 12.6*  HGB 12.4 12.1 10.5*  HCT 38.9 37.9 33.8*  MCV 90.7 91.1 92.1  PLT 438* 432* 381   Cardiac Enzymes: No results for input(s): CKTOTAL, CKMB, CKMBINDEX, TROPONINI in the last 168 hours. BNP (last 3 results) No results for input(s): PROBNP in the last 8760 hours. CBG: Recent Labs  Lab 07/31/18 2014 07/31/18 2203 08/01/18 0010 08/01/18 0405 08/01/18 0815  GLUCAP 87 80 88 102* 123*   D-Dimer: No results for input(s): DDIMER in the last 72 hours. Hgb A1c: No results for input(s): HGBA1C in the last 72 hours. Lipid Profile: No results for input(s): CHOL, HDL, LDLCALC, TRIG, CHOLHDL, LDLDIRECT in the last 72 hours. Thyroid function studies:  No results for input(s): TSH, T4TOTAL, T3FREE, THYROIDAB in the last 72 hours.  Invalid input(s): FREET3 Anemia work up: No results for input(s): VITAMINB12, FOLATE, FERRITIN, TIBC, IRON, RETICCTPCT in the last 72 hours. Sepsis Labs: Recent Labs  Lab 07/29/18 2104 07/30/18 0610 08/01/18 0434  WBC 19.1* 16.3* 12.6*   Microbiology No results found for this or any previous visit (from the past 240 hour(s)).   Medications:   . albuterol  2.5 mg Nebulization Daily  . amLODipine  5 mg Oral Daily  . aspirin EC  81 mg Oral Daily  . cholecalciferol  1,000 Units Oral Daily  . enoxaparin (LOVENOX) injection  40 mg Subcutaneous Q24H  . escitalopram  20 mg Oral Daily  . ferrous sulfate  325 mg Oral Daily  . folic acid  1 mg Oral Daily  . gabapentin  600 mg Oral TID  . Gerhardt's butt cream   Topical BID  . insulin aspart  0-9 Units Subcutaneous Q4H  . isosorbide mononitrate  30 mg Oral Daily  . losartan  50 mg Oral Daily  . magnesium oxide  400 mg Oral Daily  . Melatonin  10 mg Oral QHS  . metaxalone  800 mg Oral 3 times per day  .  metoprolol succinate  50 mg Oral Daily  . mometasone-formoterol  2 puff Inhalation BID  . nortriptyline  10 mg Oral QHS  . oxyCODONE  15 mg Oral Q12H  . potassium chloride  10 mEq Oral BID  . rosuvastatin  5 mg Oral QHS  . senna  1-2 tablet Oral Daily  . tapentadol  50 mg Oral Q6H  . vitamin B-12  1,000 mcg Oral Daily   Continuous Infusions: . cefTRIAXone (ROCEPHIN)  IV Stopped (07/31/18 2301)  . dextrose 5 % and 0.45 % NaCl with KCl 40 mEq/L 100 mL/hr at 08/01/18 0514  . famotidine (PEPCID) IV 20 mg (08/01/18 1005)      LOS: 2 days   [facility] Pager [phone]  *Please refer to [email or web address], password [email or web address] to get updated schedule on who will round on this patient, as hospitalists switch teams weekly. If 7PM-7AM, please contact night-coverage at [email or web address], password [email or web address] for any overnight needs.  08/01/2018, 10:50 AM

## 2018-08-01 NOTE — Progress Notes (Signed)
Pt called this RN into room at [facility] after NG tube came out. Reinsertion attempted x3 with assistance from another RN. During attempts, pt turns head and pushes staff away. Pt adamantly refusing reinsertion at this time. Pt educated on importance of having NG tube. Verbalizes understanding. Will attempt again later. Will continue to monitor.

## 2018-08-02 ENCOUNTER — Inpatient Hospital Stay (HOSPITAL_COMMUNITY): Payer: Medicare Other

## 2018-08-02 LAB — BASIC METABOLIC PANEL
ANION GAP: 8 (ref 5–15)
BUN: 10 mg/dL (ref 8–23)
CALCIUM: 8.9 mg/dL (ref 8.9–10.3)
CO2: 26 mmol/L (ref 22–32)
Chloride: 102 mmol/L (ref 98–111)
Creatinine, Ser: 0.97 mg/dL (ref 0.44–1.00)
GFR, EST NON AFRICAN AMERICAN: 59 mL/min — AB (ref >60)
Glucose, Bld: 166 mg/dL — ABNORMAL HIGH (ref 70–99)
POTASSIUM: 4.8 mmol/L (ref 3.5–5.1)
Sodium: 136 mmol/L (ref 135–145)

## 2018-08-02 LAB — GLUCOSE, CAPILLARY
GLUCOSE-CAPILLARY: 159 mg/dL — AB (ref 70–99)
GLUCOSE-CAPILLARY: 165 mg/dL — AB (ref 70–99)
GLUCOSE-CAPILLARY: 157 mg/dL — AB (ref 70–99)
Glucose-Capillary: 126 mg/dL — ABNORMAL HIGH (ref 70–99)
Glucose-Capillary: 155 mg/dL — ABNORMAL HIGH (ref 70–99)
Glucose-Capillary: 160 mg/dL — ABNORMAL HIGH (ref 70–99)

## 2018-08-02 MED ORDER — KCL IN DEXTROSE-NACL 40-5-0.45 MEQ/L-%-% IV SOLN
INTRAVENOUS | Status: DC
  Administered 2018-08-02 – 2018-08-06 (×5): via INTRAVENOUS
  Filled 2018-08-02 (×6): qty 1000

## 2018-08-02 MED ORDER — PROCHLORPERAZINE EDISYLATE 10 MG/2ML IJ SOLN
10.0000 mg | Freq: Four times a day (QID) | INTRAMUSCULAR | Status: DC | PRN
  Administered 2018-08-03: 10 mg via INTRAVENOUS
  Filled 2018-08-02: qty 2

## 2018-08-02 MED ORDER — POTASSIUM CHLORIDE 2 MEQ/ML IV SOLN: INTRAVENOUS | Status: DC

## 2018-08-02 NOTE — Progress Notes (Signed)
[facility] PROGRESS NOTE    Progress Note  Lauren [name]  PVX:[id number] DOB: 1951/04/08 DOA: 07/29/2018 PCP: [name], MD     Brief Narrative:   Lauren [name] is an 67 y.o. female past medical history of hypertension, hyperlipidemia, diabetes mellitus type 2, with a history of CVA, COPD psoriasis ventral hernia status post endometrial cancer comes in complaining of abdominal pain that started around 4 AM the day prior to admission she denies any fever chills diarrhea black stools bright red blood per rectum, CT scan of the abdomen and pelvis shows small bowel obstruction with a transition point lower than the ventral hernia  Assessment/Plan:   SBO (small bowel obstruction) ([place]): Keep the patient n.p.o. continue IV fluids. NG tube is out. Replete electrolytes as needed. We will get a small bowel protocol. Start on Protonix twice daily.  Further management per surgical team. Consult physical therapy.  Hypovolemic hypernatremia: Resolved with IV fluid hydration continue D5 half-normal with potassium.  Diabetes mellitus without complication ([place]) Fairly controlled continue check CBGs, patient is n.p.o. on D5 half-normal with potassium supplementation.  Possible acute lower UTI UA showed many bacteria 21-50 white blood cells started empirically on IV Rocephin. Urine cultures were not sent on admission we will treat empirically for 3 days.  DVT prophylaxis: lovenxo Family Communication:none Disposition Plan/Barrier to D/C: home in 3 days Code Status:     Code Status Orders  (From admission, onward)        Start     Ordered   07/30/18 0247  Full code  Continuous     07/30/18 0252    Code Status History    Date Active Date Inactive Code Status Order ID Comments User Context   06/28/2018 1421 06/28/2018 1953 Full Code [id number]  [name], MD Inpatient   08/22/2017 0309 08/25/2017 1738 Full Code [id number]  [name], MD ED   02/02/2017 2358 02/05/2017 1849 Full Code [id number]  [name], DO ED   08/17/2015 1211 08/18/2015 1155 Full Code [id number]  [name], PA-C Inpatient        IV Access:    Peripheral IV   Procedures and diagnostic studies:   Dg Abd 1 View  Result Date: 08/02/2018 CLINICAL DATA:  67 year old female with a history of bowel obstruction EXAM: ABDOMEN - 1 VIEW COMPARISON:  Multiple prior plain film, with most recent 08/01/2018. Prior CT 07/30/2018 FINDINGS: Limited plain film abdomen. There has been removal of the gastric tube. Gas within stomach. Multiple dilated small bowel loops within the mid abdomen, right abdomen, left abdomen. Enteric contrast present within the hepatic flexure. Decompressed left colon, with small enteric contrast at the splenic flexure. Distal colon relatively decompressed. Surgical changes of the lumbar region. IMPRESSION: Plain film evidence of partial small bowel obstruction. Gastric tube is been removed. Electronically Signed   By: Corrie Mckusick D.O.   On: 08/02/2018 11:10   Dg Abd 1 View  Result Date: 08/01/2018 CLINICAL DATA:  Abdominal pain and nausea. Small bowel obstruction. Ventral hernia. Endometrial carcinoma. EXAM: ABDOMEN - 1 VIEW COMPARISON:  07/30/2018 FINDINGS: Nasogastric tube remains in place with tip overlying the gastric antrum. Multiple dilated small bowel loops have increased since previous study. Oral contrast material from previous CT is now seen within mildly dilated small bowel loops in right lower quadrant ventral hernia. A small amount of contrast is seen within the ascending and transverse colon which is nondilated. IMPRESSION: Partial small bowel obstruction, with increased small bowel dilatation  since previous study. Oral contrast now seen in dilated small bowel loops within right abdominal ventral hernia. Electronically Signed   By: Earle Gell M.D.   On: 08/01/2018 14:13     Medical Consultants:    None.  Anti-Infectives:    rocephin  Subjective:    Lauren [name] relates some indigestion with burning, she also denies any bowel movements she is not passing gas. Objective:    Vitals:   08/01/18 1220 08/01/18 2034 08/02/18 0450 08/02/18 0454  BP: (!) 158/64 136/73  139/65  Pulse: 73 70  72  Resp: 14 12  14   Temp: 98 F (36.7 C) 98.8 F (37.1 C)  98.7 F (37.1 C)  TempSrc: Oral Oral  Oral  SpO2: 97% 94%  95%  Weight:   93.5 kg (206 lb 2.1 oz)   Height:        Intake/Output Summary (Last 24 hours) at 08/02/2018 1116 Last data filed at 08/02/2018 1000 Gross per 24 hour  Intake 3031.43 ml  Output 1950 ml  Net 1081.43 ml   Filed Weights   07/31/18 0500 08/01/18 0500 08/02/18 0450  Weight: 91.1 kg (200 lb 13.4 oz) 92.9 kg (204 lb 12.9 oz) 93.5 kg (206 lb 2.1 oz)    Exam: General exam: In no acute distress. Respiratory system: Good air movement and clear to auscultation. Cardiovascular system: S1 & S2 heard, RRR.  Gastrointestinal system: Abdomen is soft with ventral hernia nontender to palpation no rebound or guarding. Central nervous system: Alert and oriented. No focal neurological deficits. Extremities: No pedal edema. Skin: No rashes, lesions or ulcers Psychiatry: Judgement and insight appear normal. Mood & affect appropriate.    Data Reviewed:    Labs: Basic Metabolic Panel: Recent Labs  Lab 07/29/18 2104 07/30/18 0610 07/31/18 1211 08/01/18 0434 08/02/18 0441  NA 150* 142 137 141 136  K 4.3 3.6 3.9 4.2 4.8  CL 107 99 100 101 102  CO2 29 30 28 29 26   GLUCOSE 196* 133* 131* 115* 166*  BUN 21 24* 21 16 10   CREATININE 1.11* 1.25* 1.11* 1.17* 0.97  CALCIUM 9.9 9.2 8.6* 8.7* 8.9   GFR Estimated Creatinine Clearance: 64.9 mL/min (by C-G formula based on SCr of 0.97 mg/dL). Liver Function Tests: Recent Labs  Lab 07/29/18 2104 07/30/18 0610  AST 24 21  ALT 25 22  ALKPHOS 62 56  BILITOT 0.7 0.6  PROT 7.7 7.3  ALBUMIN 4.0 3.6   Recent Labs  Lab 07/29/18 2104   LIPASE 23   No results for input(s): AMMONIA in the last 168 hours. Coagulation profile No results for input(s): INR, PROTIME in the last 168 hours.  CBC: Recent Labs  Lab 07/29/18 2104 07/30/18 0610 08/01/18 0434  WBC 19.1* 16.3* 12.6*  HGB 12.4 12.1 10.5*  HCT 38.9 37.9 33.8*  MCV 90.7 91.1 92.1  PLT 438* 432* 381   Cardiac Enzymes: No results for input(s): CKTOTAL, CKMB, CKMBINDEX, TROPONINI in the last 168 hours. BNP (last 3 results) No results for input(s): PROBNP in the last 8760 hours. CBG: Recent Labs  Lab 08/01/18 1603 08/01/18 2030 08/02/18 0005 08/02/18 0447 08/02/18 0801  GLUCAP 134* 153* 126* 159* 165*   D-Dimer: No results for input(s): DDIMER in the last 72 hours. Hgb A1c: No results for input(s): HGBA1C in the last 72 hours. Lipid Profile: No results for input(s): CHOL, HDL, LDLCALC, TRIG, CHOLHDL, LDLDIRECT in the last 72 hours. Thyroid function studies: No results for input(s): TSH, T4TOTAL, T3FREE, THYROIDAB  in the last 72 hours.  Invalid input(s): FREET3 Anemia work up: No results for input(s): VITAMINB12, FOLATE, FERRITIN, TIBC, IRON, RETICCTPCT in the last 72 hours. Sepsis Labs: Recent Labs  Lab 07/29/18 2104 07/30/18 0610 08/01/18 0434  WBC 19.1* 16.3* 12.6*   Microbiology No results found for this or any previous visit (from the past 240 hour(s)).   Medications:   . albuterol  2.5 mg Nebulization Daily  . amLODipine  5 mg Oral Daily  . aspirin EC  81 mg Oral Daily  . cholecalciferol  1,000 Units Oral Daily  . enoxaparin (LOVENOX) injection  40 mg Subcutaneous Q24H  . escitalopram  20 mg Oral Daily  . ferrous sulfate  325 mg Oral Daily  . folic acid  1 mg Oral Daily  . gabapentin  600 mg Oral TID  . Gerhardt's butt cream   Topical BID  . insulin aspart  0-9 Units Subcutaneous Q4H  . isosorbide mononitrate  30 mg Oral Daily  . losartan  50 mg Oral Daily  . magnesium oxide  400 mg Oral Daily  . Melatonin  10 mg Oral QHS   . metaxalone  800 mg Oral 3 times per day  . metoprolol succinate  50 mg Oral Daily  . mometasone-formoterol  2 puff Inhalation BID  . nortriptyline  10 mg Oral QHS  . oxyCODONE  15 mg Oral Q12H  . pantoprazole  40 mg Oral BID  . potassium chloride  10 mEq Oral BID  . rosuvastatin  5 mg Oral QHS  . senna  1-2 tablet Oral Daily  . tapentadol  50 mg Oral Q6H  . vitamin B-12  1,000 mcg Oral Daily   Continuous Infusions: . cefTRIAXone (ROCEPHIN)  IV Stopped (08/01/18 2312)  . dextrose 5 % and 0.45 % NaCl with KCl 40 mEq/L 100 mL/hr at 08/02/18 0600  . famotidine (PEPCID) IV 20 mg (08/02/18 1010)      LOS: 3 days   [facility] Pager [phone]  *Please refer to [email or web address], password [email or web address] to get updated schedule on who will round on this patient, as hospitalists switch teams weekly. If 7PM-7AM, please contact night-coverage at [email or web address], password [email or web address] for any overnight needs.  08/02/2018, 11:16 AM

## 2018-08-02 NOTE — Care Management Important Message (Signed)
Important Message  Patient Details  Name: Lauren [name] MRN: [id number] Date of Birth: [birth year]   Medicare Important Message Given:  Yes    Kerin Salen 08/02/2018, 11:19 AMImportant Message  Patient Details  Name: Lauren [name] MRN: [id number] Date of Birth: 12-15-1951   Medicare Important Message Given:  Yes    Kerin Salen 08/02/2018, 11:17 AM

## 2018-08-02 NOTE — Progress Notes (Signed)
Patient ID: Lauren [name], female   DOB: [birth year], 67 y.o.   MRN: [id number]       Subjective: Pt's NGT came out overnight.  No nausea since out and no flatus.  No abdominal pain.  Objective: Vital signs in last 24 hours: Temp:  [98 F (36.7 C)-98.8 F (37.1 C)] 98.7 F (37.1 C) (08/05 0454) Pulse Rate:  [70-73] 72 (08/05 0454) Resp:  [12-14] 14 (08/05 0454) BP: (136-158)/(64-73) 139/65 (08/05 0454) SpO2:  [94 %-97 %] 95 % (08/05 0454) Weight:  [93.5 kg (206 lb 2.1 oz)] 93.5 kg (206 lb 2.1 oz) (08/05 0450) Last BM Date: 07/30/18  Intake/Output from previous day: 08/04 0701 - 08/05 0700 In: 2511.5 [P.O.:100; I.V.:2211.5; IV Piggyback:200] Out: 1950 [Urine:1500; Emesis/NG output:450] Intake/Output this shift: No intake/output data recorded.  PE: Heart: regular Lungs: CTAB Abd: soft, NT, large ventral hernia noted, +BS  Lab Results:  Recent Labs    08/01/18 0434  WBC 12.6*  HGB 10.5*  HCT 33.8*  PLT 381   BMET Recent Labs    08/01/18 0434 08/02/18 0441  NA 141 136  K 4.2 4.8  CL 101 102  CO2 29 26  GLUCOSE 115* 166*  BUN 16 10  CREATININE 1.17* 0.97  CALCIUM 8.7* 8.9   PT/INR No results for input(s): LABPROT, INR in the last 72 hours. CMP     Component Value Date/Time   NA 136 08/02/2018 0441   NA 142 01/28/2018 1035   K 4.8 08/02/2018 0441   CL 102 08/02/2018 0441   CO2 26 08/02/2018 0441   GLUCOSE 166 (H) 08/02/2018 0441   BUN 10 08/02/2018 0441   BUN 19 01/28/2018 1035   BUN 14.7 07/18/2014 1142   CREATININE 0.97 08/02/2018 0441   CREATININE 1.0 07/18/2014 1142   CALCIUM 8.9 08/02/2018 0441   PROT 7.3 07/30/2018 0610   PROT 6.5 03/10/2017 1656   ALBUMIN 3.6 07/30/2018 0610   AST 21 07/30/2018 0610   ALT 22 07/30/2018 0610   ALKPHOS 56 07/30/2018 0610   BILITOT 0.6 07/30/2018 0610   GFRNONAA 59 (L) 08/02/2018 0441   GFRAA >60 08/02/2018 0441   Lipase     Component Value Date/Time   LIPASE 23 07/29/2018 2104        Studies/Results: Dg Abd 1 View  Result Date: 08/01/2018 CLINICAL DATA:  Abdominal pain and nausea. Small bowel obstruction. Ventral hernia. Endometrial carcinoma. EXAM: ABDOMEN - 1 VIEW COMPARISON:  07/30/2018 FINDINGS: Nasogastric tube remains in place with tip overlying the gastric antrum. Multiple dilated small bowel loops have increased since previous study. Oral contrast material from previous CT is now seen within mildly dilated small bowel loops in right lower quadrant ventral hernia. A small amount of contrast is seen within the ascending and transverse colon which is nondilated. IMPRESSION: Partial small bowel obstruction, with increased small bowel dilatation since previous study. Oral contrast now seen in dilated small bowel loops within right abdominal ventral hernia. Electronically Signed   By: Earle Gell M.D.   On: 08/01/2018 14:13    Anti-infectives: Anti-infectives (From admission, onward)   Start     Dose/Rate Route Frequency Ordered Stop   07/30/18 2200  cefTRIAXone (ROCEPHIN) 1 g in sodium chloride 0.9 % 100 mL IVPB     1 g 200 mL/hr over 30 Minutes Intravenous Every 24 hours 07/30/18 0253     07/30/18 0200  cefTRIAXone (ROCEPHIN) 1 g in sodium chloride 0.9 % 100 mL IVPB  1 g 200 mL/hr over 30 Minutes Intravenous  Once 07/30/18 0151 07/30/18 0322       Assessment/Plan SBO secondary to chronically incarcerated large ventral incisional hernia  -reinsert NGT and try to get patient better with conservative management. -repeat films today -if patient does not improve, she will likely require surgical intervention which puts her at high risk for complications and recurrence. -cont NPO -minimize narcotics as much as able, but patient on chronic narcotics at home for chronic pain -will follow  H/O uterine ca, s/p hysterectomy CVA Obesity HTN DM COPD CAD  FEN - NPO, re-insert NGT, IVFs  VTE - SCDs, Lovenox ID - none   LOS: 3 days    Henreitta Cea  , [facility] 08/02/2018, 8:34 AM Pager: [phone]

## 2018-08-03 ENCOUNTER — Inpatient Hospital Stay (HOSPITAL_COMMUNITY): Payer: Medicare Other

## 2018-08-03 LAB — BASIC METABOLIC PANEL
ANION GAP: 10 (ref 5–15)
BUN: 11 mg/dL (ref 8–23)
CALCIUM: 9 mg/dL (ref 8.9–10.3)
CHLORIDE: 99 mmol/L (ref 98–111)
CO2: 28 mmol/L (ref 22–32)
Creatinine, Ser: 1.04 mg/dL — ABNORMAL HIGH (ref 0.44–1.00)
GFR calc non Af Amer: 54 mL/min — ABNORMAL LOW (ref >60)
GLUCOSE: 231 mg/dL — AB (ref 70–99)
Potassium: 4.9 mmol/L (ref 3.5–5.1)
Sodium: 137 mmol/L (ref 135–145)

## 2018-08-03 LAB — CBC
HEMATOCRIT: 35 % — AB (ref 36.0–46.0)
HEMOGLOBIN: 11 g/dL — AB (ref 12.0–15.0)
MCH: 28.4 pg (ref 26.0–34.0)
MCHC: 31.4 g/dL (ref 30.0–36.0)
MCV: 90.4 fL (ref 78.0–100.0)
Platelets: 410 10*3/uL — ABNORMAL HIGH (ref 150–400)
RBC: 3.87 MIL/uL (ref 3.87–5.11)
RDW: 16.5 % — AB (ref 11.5–15.5)
WBC: 14.2 10*3/uL — AB (ref 4.0–10.5)

## 2018-08-03 LAB — GLUCOSE, CAPILLARY
GLUCOSE-CAPILLARY: 167 mg/dL — AB (ref 70–99)
Glucose-Capillary: 210 mg/dL — ABNORMAL HIGH (ref 70–99)
Glucose-Capillary: 185 mg/dL — ABNORMAL HIGH (ref 70–99)
Glucose-Capillary: 129 mg/dL — ABNORMAL HIGH (ref 70–99)

## 2018-08-03 MED ORDER — HYDRALAZINE HCL 20 MG/ML IJ SOLN
5.0000 mg | INTRAMUSCULAR | Status: DC | PRN
  Administered 2018-08-03 – 2018-08-05 (×2): 5 mg via INTRAVENOUS
  Filled 2018-08-03 (×2): qty 1

## 2018-08-03 MED ORDER — METOPROLOL TARTRATE 5 MG/5ML IV SOLN
5.0000 mg | Freq: Four times a day (QID) | INTRAVENOUS | Status: DC
  Administered 2018-08-03 – 2018-08-06 (×11): 5 mg via INTRAVENOUS
  Filled 2018-08-03 (×11): qty 5

## 2018-08-03 MED ORDER — PROMETHAZINE HCL 25 MG/ML IJ SOLN
12.5000 mg | Freq: Once | INTRAMUSCULAR | Status: AC
  Administered 2018-08-03: 12.5 mg via INTRAVENOUS
  Filled 2018-08-03: qty 1

## 2018-08-03 MED ORDER — MILK AND MOLASSES ENEMA
1.0000 | Freq: Once | RECTAL | Status: DC
  Filled 2018-08-03: qty 250

## 2018-08-03 MED ORDER — IOPAMIDOL (ISOVUE-300) INJECTION 61%
INTRAVENOUS | Status: AC
  Filled 2018-08-03: qty 50

## 2018-08-03 MED ORDER — LORAZEPAM 2 MG/ML IJ SOLN
1.0000 mg | Freq: Once | INTRAMUSCULAR | Status: AC
  Administered 2018-08-03: 1 mg via INTRAVENOUS
  Filled 2018-08-03: qty 1

## 2018-08-03 MED ORDER — LIP MEDEX EX OINT
TOPICAL_OINTMENT | CUTANEOUS | Status: AC
  Administered 2018-08-03: 17:00:00
  Filled 2018-08-03: qty 7

## 2018-08-03 MED ORDER — BUTAMBEN-TETRACAINE-BENZOCAINE 2-2-14 % EX AERO
INHALATION_SPRAY | CUTANEOUS | Status: AC
  Filled 2018-08-03: qty 20

## 2018-08-03 MED ORDER — MORPHINE SULFATE (PF) 2 MG/ML IV SOLN
2.0000 mg | INTRAVENOUS | Status: DC | PRN
  Administered 2018-08-03 – 2018-08-05 (×7): 2 mg via INTRAVENOUS
  Filled 2018-08-03 (×7): qty 1

## 2018-08-03 MED ORDER — FAMOTIDINE IN NACL 20-0.9 MG/50ML-% IV SOLN: 20.0000 mg | Freq: Two times a day (BID) | INTRAVENOUS | Status: DC

## 2018-08-03 NOTE — Evaluation (Signed)
Physical Therapy Evaluation Patient Details Name: Lauren [name] MRN: [id number] DOB: [birth year] Today's Date: 08/03/2018   History of Present Illness  67 y.o. female past medical history of hypertension, hyperlipidemia, diabetes mellitus type 2, CVA, COPD, multiple back surgeries, ventral hernia, status post endometrial cancer comes in complaining of abdominal pain and admitted for SBO secondary to chronically incarcerated large ventral incisional hernia   Clinical Impression  Pt admitted with above diagnosis. Pt currently with functional limitations due to the deficits listed below (see PT Problem List).  Pt will benefit from skilled PT to increase their independence and safety with mobility to allow discharge to the venue listed below.  Pt requiring encouragement for OOB.  Pt agreeable to up to recliner however declined ambulating.  Pt assisted with donning shoes (L shoe with AFO).  Caregiver also present and provided encouragement.   Caregiver reports she provides some assist for pt at home.  Recommend HHPT if pt has 24/7 assist.  Pt is able to ambulate short distance however uses w/c (present in room) for longer distances.     Follow Up Recommendations Supervision/Assistance - 24 hour;Home health PT    Equipment Recommendations  None recommended by PT    Recommendations for Other Services       Precautions / Restrictions Precautions Precautions: Fall Required Braces or Orthoses: Other Brace/Splint Other Brace/Splint: has AFO in L shoe      Mobility  Bed Mobility Overal bed mobility: Needs Assistance Bed Mobility: Supine to Sit     Supine to sit: Mod assist;HOB elevated     General bed mobility comments: requires encouragement to participate and self assist, assist for scooting to EOB and trunk fully upright  Transfers Overall transfer level: Needs assistance Equipment used: Rolling walker (2 wheeled) Transfers: Sit to/from [facility] Sit to Stand: Min  assist;+2 safety/equipment Stand pivot transfers: Min assist       General transfer comment: verbal cues for safe technique, assist for initiating standing however pt was able to assist, encouraged to take a couple steps to recliner  Ambulation/Gait             General Gait Details: pt declined to ambulate today  Stairs            Wheelchair Mobility    Modified Rankin (Stroke Patients Only)       Balance Overall balance assessment: Needs assistance Sitting-balance support: Bilateral upper extremity supported;Feet supported Sitting balance-Leahy Scale: Poor     Standing balance support: Bilateral upper extremity supported Standing balance-Leahy Scale: Poor                               Pertinent Vitals/Pain Pain Assessment: Faces Faces Pain Scale: Hurts even more Pain Location: bil feet with donning shoes Pain Descriptors / Indicators: Tender;Discomfort;Grimacing Pain Intervention(s): Limited activity within patient's tolerance;Monitored during session    Home Living Family/patient expects to be discharged to:: Private residence Living Arrangements: Spouse/significant other Available Help at Discharge: Family;Available PRN/intermittently;Personal care attendant Type of Home: House       Home Layout: One level Home Equipment: Walker - 2 wheels;Transport chair      Prior Function Level of Independence: Needs assistance   Gait / Transfers Assistance Needed: pt sleeps in her recliner and uses a RW to ambulate short distances.  Uses transport chair for longer distances. Husband helps with a lot of her basic mobility.  Pt has not been OOB very  much approximately a week leading to admission           Hand Dominance        Extremity/Trunk Assessment   Upper Extremity Assessment Upper Extremity Assessment: Generalized weakness    Lower Extremity Assessment Lower Extremity Assessment: Generalized weakness;RLE deficits/detail;LLE  deficits/detail RLE Deficits / Details: bil resting PF LLE Deficits / Details: AFO present in L shoe       Communication   Communication: No difficulties  Cognition Arousal/Alertness: Awake/alert Behavior During Therapy: WFL for tasks assessed/performed Overall Cognitive Status: Within Functional Limits for tasks assessed                                 General Comments: requires increased encouragement      General Comments      Exercises     Assessment/Plan    PT Assessment Patient needs continued PT services  PT Problem List         PT Treatment Interventions DME instruction;Therapeutic activities;Gait training;Therapeutic exercise;Patient/family education;Functional mobility training;Balance training;Wheelchair mobility training    PT Goals (Current goals can be found in the Care Plan section)  Acute Rehab PT Goals PT Goal Formulation: With patient/family Time For Goal Achievement: 08/17/18 Potential to Achieve Goals: Fair    Frequency Min 3X/week   Barriers to discharge        Co-evaluation               AM-PAC PT "6 Clicks" Daily Activity  Outcome Measure Difficulty turning over in bed (including adjusting bedclothes, sheets and blankets)?: A Lot Difficulty moving from lying on back to sitting on the side of the bed? : Unable Difficulty sitting down on and standing up from a chair with arms (e.g., wheelchair, bedside commode, etc,.)?: Unable Help needed moving to and from a bed to chair (including a wheelchair)?: A Little Help needed walking in hospital room?: A Lot Help needed climbing 3-5 steps with a railing? : Total 6 Click Score: 10    End of Session Equipment Utilized During Treatment: Gait belt Activity Tolerance: Patient tolerated treatment well Patient left: with call bell/phone within reach;in chair;with chair alarm set;with family/visitor present Nurse Communication: Mobility status PT Visit Diagnosis: Difficulty in  walking, not elsewhere classified (R26.2);Muscle weakness (generalized) (M62.81)    Time: 4709-6283 PT Time Calculation (min) (ACUTE ONLY): 26 min   Charges:   PT Evaluation $PT Eval Low Complexity: 1 Low          Carmelia Bake, PT, DPT 08/03/2018 Pager: [phone]   York Ram E 08/03/2018, 11:46 AM

## 2018-08-03 NOTE — Progress Notes (Signed)
Initial Nutrition Assessment  DOCUMENTATION CODES:   Obesity unspecified  INTERVENTION:    Pt has not ate in 5 days, recommend initiating TPN if she continues to be without nutrition.   Monitor for diet advancement/toleration  NUTRITION DIAGNOSIS:   Increased nutrient needs related to wound healing as evidenced by estimated needs.  GOAL:   Patient will meet greater than or equal to 90% of their needs  MONITOR:   Diet advancement, Weight trends, Labs, I & O's, Skin  REASON FOR ASSESSMENT:   NPO/Clear Liquid Diet, Other (Comment)(PI report)    ASSESSMENT:   Patient with PMH significant for HTN, HLD, DM, CVA, COPD, and ventral hernia, s/p endometrial cancer. Presents this admission with complaints of abdominal pain and diarrhea. CT scan of the abdomen and pelvis shows small bowel obstruction with a transition point within the ventral hernia.    Pt reports her appetite was great until one day PTA. States typically she eats three meals/day that consist of meats, vegetables, and grains. Pt has remained NPO since admit on 8/1. She complains of a vomiting spell all last night, but this has since resolved. Spoke with RN regarding NGT placement. She had multiple bedside placements and an IR placement that were not successful. The pt claims to have pulled out all of them as they were being placed. Discussed the importance for the NGT with pt. Pt willing to have one placed if she can be sedated.   CCS notes they are hopeful to avoid surgery as she would be high risk given the size of her hernia and the time it has been present (10 yrs).   Pt's is unsure of her UBW and denies any recent wt loss. Records indicate pt has gained wt over the last year from 183 lb on 09/11/18 to 201 lb this admission. Nutrition-Focused physical exam completed.   Medications reviewed and include: Vit D, ferrous sulfate, folic acid, Mg oxide, 10 mEq KCl BID, Vit B12 Labs reviewed: CBG 231 (H)   NUTRITION -  FOCUSED PHYSICAL EXAM:    Most Recent Value  Orbital Region  No depletion  Upper Arm Region  No depletion  Thoracic and Lumbar Region  No depletion  Buccal Region  No depletion  Temple Region  No depletion  Clavicle Bone Region  No depletion  Clavicle and Acromion Bone Region  No depletion  Scapular Bone Region  No depletion  Dorsal Hand  No depletion  Patellar Region  No depletion  Anterior Thigh Region  Mild depletion  Posterior Calf Region  Mild depletion  Edema (RD Assessment)  Mild  Hair  Reviewed  Eyes  Reviewed  Mouth  Reviewed  Skin  Reviewed  Nails  Reviewed     Diet Order:   Diet Order           Diet NPO time specified Except for: Sips with Meds  Diet effective now          EDUCATION NEEDS:   Education needs have been addressed  Skin:  Skin Assessment: Skin Integrity Issues: Skin Integrity Issues:: Diabetic Ulcer Diabetic Ulcer: right heel  Last BM:  08/03/18  Height:   Ht Readings from Last 1 Encounters:  07/30/18 5\' 6"  (1.676 m)    Weight:   Wt Readings from Last 1 Encounters:  08/03/18 201 lb 1 oz (91.2 kg)    Ideal Body Weight:  59.1 kg  BMI:  Body mass index is 32.45 kg/m.  Estimated Nutritional Needs:   Kcal:  1550-1750  kcal  Protein:  75-85 grams   Fluid:  >/= 1.5 L/day    Mariana Single RD, LDN Clinical Nutrition Pager # - [phone]

## 2018-08-03 NOTE — Progress Notes (Signed)
[facility] Progress Note     Subjective: CC-  Patient states that she feels about the same. Continues to complain of intermittent diffuse abdominal pain. She vomited multiple times yesterday. No flatus or BM.  Ambulates some at home with a walker, but typically uses a wheelchair for mobilization.  Objective: Vital signs in last 24 hours: Temp:  [98.2 F (36.8 C)-99.1 F (37.3 C)] 99.1 F (37.3 C) (08/06 0526) Pulse Rate:  [79-83] 82 (08/06 0526) Resp:  [16-18] 18 (08/05 2015) BP: (138-186)/(67-83) 157/69 (08/06 0526) SpO2:  [95 %-99 %] 95 % (08/06 0526) Weight:  [201 lb 1 oz (91.2 kg)] 201 lb 1 oz (91.2 kg) (08/06 0524) Last BM Date: 08/03/18  Intake/Output from previous day: 08/05 0701 - 08/06 0700 In: 1636.8 [P.O.:240; I.V.:1201.7; IV Piggyback:195.1] Out: 875 [Urine:375; Emesis/NG output:500] Intake/Output this shift: No intake/output data recorded.  PE: Gen:  Alert, NAD Card:  RRR Pulm:  CTAB, no W/R/R, effort normal Abd: obese, soft, nontender, large ventral hernia soft without overlying skin changes, +BS Skin: no rashes noted, warm and dry  Lab Results:  Recent Labs    08/01/18 0434 08/03/18 0443  WBC 12.6* 14.2*  HGB 10.5* 11.0*  HCT 33.8* 35.0*  PLT 381 410*   BMET Recent Labs    08/02/18 0441 08/03/18 0443  NA 136 137  K 4.8 4.9  CL 102 99  CO2 26 28  GLUCOSE 166* 231*  BUN 10 11  CREATININE 0.97 1.04*  CALCIUM 8.9 9.0   PT/INR No results for input(s): LABPROT, INR in the last 72 hours. CMP     Component Value Date/Time   NA 137 08/03/2018 0443   NA 142 01/28/2018 1035   K 4.9 08/03/2018 0443   CL 99 08/03/2018 0443   CO2 28 08/03/2018 0443   GLUCOSE 231 (H) 08/03/2018 0443   BUN 11 08/03/2018 0443   BUN 19 01/28/2018 1035   BUN 14.7 07/18/2014 1142   CREATININE 1.04 (H) 08/03/2018 0443   CREATININE 1.0 07/18/2014 1142   CALCIUM 9.0 08/03/2018 0443   PROT 7.3 07/30/2018 0610   PROT 6.5 03/10/2017 1656   ALBUMIN  3.6 07/30/2018 0610   AST 21 07/30/2018 0610   ALT 22 07/30/2018 0610   ALKPHOS 56 07/30/2018 0610   BILITOT 0.6 07/30/2018 0610   GFRNONAA 54 (L) 08/03/2018 0443   GFRAA >60 08/03/2018 0443   Lipase     Component Value Date/Time   LIPASE 23 07/29/2018 2104       Studies/Results: Dg Abd 1 View  Result Date: 08/02/2018 CLINICAL DATA:  67 year old female with a history of bowel obstruction EXAM: ABDOMEN - 1 VIEW COMPARISON:  Multiple prior plain film, with most recent 08/01/2018. Prior CT 07/30/2018 FINDINGS: Limited plain film abdomen. There has been removal of the gastric tube. Gas within stomach. Multiple dilated small bowel loops within the mid abdomen, right abdomen, left abdomen. Enteric contrast present within the hepatic flexure. Decompressed left colon, with small enteric contrast at the splenic flexure. Distal colon relatively decompressed. Surgical changes of the lumbar region. IMPRESSION: Plain film evidence of partial small bowel obstruction. Gastric tube is been removed. Electronically Signed   By: Corrie Mckusick D.O.   On: 08/02/2018 11:10   Dg Abd 1 View  Result Date: 08/01/2018 CLINICAL DATA:  Abdominal pain and nausea. Small bowel obstruction. Ventral hernia. Endometrial carcinoma. EXAM: ABDOMEN - 1 VIEW COMPARISON:  07/30/2018 FINDINGS: Nasogastric tube remains in place with tip overlying the gastric antrum.  Multiple dilated small bowel loops have increased since previous study. Oral contrast material from previous CT is now seen within mildly dilated small bowel loops in right lower quadrant ventral hernia. A small amount of contrast is seen within the ascending and transverse colon which is nondilated. IMPRESSION: Partial small bowel obstruction, with increased small bowel dilatation since previous study. Oral contrast now seen in dilated small bowel loops within right abdominal ventral hernia. Electronically Signed   By: Earle Gell M.D.   On: 08/01/2018 14:13     Anti-infectives: Anti-infectives (From admission, onward)   Start     Dose/Rate Route Frequency Ordered Stop   07/30/18 2200  cefTRIAXone (ROCEPHIN) 1 g in sodium chloride 0.9 % 100 mL IVPB     1 g 200 mL/hr over 30 Minutes Intravenous Every 24 hours 07/30/18 0253     07/30/18 0200  cefTRIAXone (ROCEPHIN) 1 g in sodium chloride 0.9 % 100 mL IVPB     1 g 200 mL/hr over 30 Minutes Intravenous  Once 07/30/18 0151 07/30/18 0322       Assessment/Plan H/O uterine ca, s/p hysterectomy CVA Obesity HTN DM COPD CAD  SBO secondary to chronically incarcerated large ventral incisional hernia  - CT 8/2 showed small bowel obstruction with transition point within the large lower ventral abdominal wall hernia. Transition occurs within the mesentery located in the hernia sac rather than  the hernia neck suggesting adhesions as source of obstruction. Mesenteric edema and free fluid but no perforation - abdominal pain about the same, nontender on exam, no flatus or BM  ID - rocephin 8/2>> FEN - IVF, NPO, replace NG tube VTE - SCDs, lovenox Foley - in place  Plan - To radiology today for NG tube placement. Enema ordered. Encouraged more OOB/mobilization today. Labs in AM.   LOS: 4 days    Wellington Hampshire , [facility] 08/03/2018, 8:04 AM Pager: [phone] Consults: [phone] Mon 7:00 am -11:30 AM Tues-Fri 7:00 am-4:30 pm Sat-Sun 7:00 am-11:30 am

## 2018-08-03 NOTE — Progress Notes (Addendum)
[facility] PROGRESS NOTE    Progress Note  Lauren [name]  OZD:[id number] DOB: [birth year] DOA: 07/29/2018 PCP: [name], MD     Brief Narrative:   Lauren [name] is an 67 y.o. female past medical history of hypertension, hyperlipidemia, diabetes mellitus type 2, with a history of CVA, COPD psoriasis ventral hernia status post endometrial cancer comes in complaining of abdominal pain that started around 4 AM the day prior to admission she denies any fever chills diarrhea black stools bright red blood per rectum, CT scan of the abdomen and pelvis shows small bowel obstruction with a transition point lower than the ventral hernia  Assessment/Plan:   SBO (small bowel obstruction) ([place]): Keep the patient n.p.o. continue IV fluids. Unsuccessful placement of NG tube Replete electrolytes as needed. No contrast in the hepatic flexure Continue Protonix twice daily.  Further management per surgical team.  She is high risk for surgical complications. Physical therapy has evaluated the patient probably need 24-hour supervision and home health PT. We will change most of her medications to IV form as she is not able to tolerate orals.  Hypovolemic hypernatremia: Resolved with IV fluid hydration continue D5 half-normal with potassium.  Diabetes mellitus without complication ([place]) Fairly controlled continue check CBGs, patient is n.p.o. on D5 half-normal with potassium supplementation.  Possible acute lower UTI Urine cultures were not sent on admission, she completed 3-day course of antibiotics.  DVT prophylaxis: lovenxo Family Communication:none Disposition Plan/Barrier to D/C: Unable to determine. Code Status:     Code Status Orders  (From admission, onward)        Start     Ordered   07/30/18 0247  Full code  Continuous     07/30/18 0252    Code Status History    Date Active Date Inactive Code Status Order ID Comments User Context   06/28/2018 1421 06/28/2018 1953  Full Code [id number]  [name], MD Inpatient   08/22/2017 0309 08/25/2017 1738 Full Code [id number]  [name], MD ED   02/02/2017 2358 02/05/2017 1849 Full Code [id number]  [name], DO ED   08/17/2015 1211 08/18/2015 1155 Full Code [id number]  [name], PA-C Inpatient        IV Access:    Peripheral IV   Procedures and diagnostic studies:   Dg Abd 1 View  Result Date: 08/02/2018 CLINICAL DATA:  67 year old female with a history of bowel obstruction EXAM: ABDOMEN - 1 VIEW COMPARISON:  Multiple prior plain film, with most recent 08/01/2018. Prior CT 07/30/2018 FINDINGS: Limited plain film abdomen. There has been removal of the gastric tube. Gas within stomach. Multiple dilated small bowel loops within the mid abdomen, right abdomen, left abdomen. Enteric contrast present within the hepatic flexure. Decompressed left colon, with small enteric contrast at the splenic flexure. Distal colon relatively decompressed. Surgical changes of the lumbar region. IMPRESSION: Plain film evidence of partial small bowel obstruction. Gastric tube is been removed. Electronically Signed   By: Corrie Mckusick D.O.   On: 08/02/2018 11:10   Dg Fluoro Rm 1-60 Min  Result Date: 08/03/2018 CLINICAL DATA:  67 year old female with nasogastric tube removed. Initial encounter. EXAM: FLOURO RM 1-60 MIN CONTRAST:  None. FLUOROSCOPY TIME:  Fluoroscopy Time:  3 seconds Radiation Exposure Index: 0.4 mGy COMPARISON:  August 02, 2018. FINDINGS: After 3 attempts of placing 5 French nasogastric tube, patient grabbed tube and removed the tube. Patient was not able to proceed with placement of tube. Gas-filled stomach noted on  fluoroscopy. IMPRESSION: Patient not able to cooperate with placement of nasogastric tube. Electronically Signed   By: Genia Del M.D.   On: 08/03/2018 09:42     Medical Consultants:    None.  Anti-Infectives:   rocephin  Subjective:    Lauren [name] patient has  been throwing up last night, she refuses to put the NG tube  Objective:    Vitals:   08/02/18 1254 08/02/18 2015 08/03/18 0524 08/03/18 0526  BP:  138/67  (!) 157/69  Pulse: 83 79  82  Resp:  18    Temp:  98.6 F (37 C)  99.1 F (37.3 C)  TempSrc:  Oral Oral Oral  SpO2: 99% 96%  95%  Weight:   91.2 kg (201 lb 1 oz)   Height:        Intake/Output Summary (Last 24 hours) at 08/03/2018 1157 Last data filed at 08/03/2018 0624 Gross per 24 hour  Intake 1116.9 ml  Output 875 ml  Net 241.9 ml   Filed Weights   08/01/18 0500 08/02/18 0450 08/03/18 0524  Weight: 92.9 kg (204 lb 12.9 oz) 93.5 kg (206 lb 2.1 oz) 91.2 kg (201 lb 1 oz)    Exam: General exam: In no acute distress. Respiratory system: Good air movement and clear to auscultation. Cardiovascular system: S1 & S2 heard, RRR.  Gastrointestinal system: Abdomen is soft with ventral hernia nontender to palpation no rebound or guarding. Central nervous system: Alert and oriented. No focal neurological deficits. Extremities: No pedal edema. Skin: No rashes, lesions or ulcers Psychiatry: Judgement and insight appear normal. Mood & affect appropriate.    Data Reviewed:    Labs: Basic Metabolic Panel: Recent Labs  Lab 07/30/18 0610 07/31/18 1211 08/01/18 0434 08/02/18 0441 08/03/18 0443  NA 142 137 141 136 137  K 3.6 3.9 4.2 4.8 4.9  CL 99 100 101 102 99  CO2 30 28 29 26 28   GLUCOSE 133* 131* 115* 166* 231*  BUN 24* 21 16 10 11   CREATININE 1.25* 1.11* 1.17* 0.97 1.04*  CALCIUM 9.2 8.6* 8.7* 8.9 9.0   GFR Estimated Creatinine Clearance: 59.7 mL/min (A) (by C-G formula based on SCr of 1.04 mg/dL (H)). Liver Function Tests: Recent Labs  Lab 07/29/18 2104 07/30/18 0610  AST 24 21  ALT 25 22  ALKPHOS 62 56  BILITOT 0.7 0.6  PROT 7.7 7.3  ALBUMIN 4.0 3.6   Recent Labs  Lab 07/29/18 2104  LIPASE 23   No results for input(s): AMMONIA in the last 168 hours. Coagulation profile No results for input(s): INR,  PROTIME in the last 168 hours.  CBC: Recent Labs  Lab 07/29/18 2104 07/30/18 0610 08/01/18 0434 08/03/18 0443  WBC 19.1* 16.3* 12.6* 14.2*  HGB 12.4 12.1 10.5* 11.0*  HCT 38.9 37.9 33.8* 35.0*  MCV 90.7 91.1 92.1 90.4  PLT 438* 432* 381 410*   Cardiac Enzymes: No results for input(s): CKTOTAL, CKMB, CKMBINDEX, TROPONINI in the last 168 hours. BNP (last 3 results) No results for input(s): PROBNP in the last 8760 hours. CBG: Recent Labs  Lab 08/02/18 0801 08/02/18 1253 08/02/18 1601 08/02/18 2100 08/03/18 0728  GLUCAP 165* 157* 155* 160* 210*   D-Dimer: No results for input(s): DDIMER in the last 72 hours. Hgb A1c: No results for input(s): HGBA1C in the last 72 hours. Lipid Profile: No results for input(s): CHOL, HDL, LDLCALC, TRIG, CHOLHDL, LDLDIRECT in the last 72 hours. Thyroid function studies: No results for input(s): TSH, T4TOTAL, T3FREE, THYROIDAB  in the last 72 hours.  Invalid input(s): FREET3 Anemia work up: No results for input(s): VITAMINB12, FOLATE, FERRITIN, TIBC, IRON, RETICCTPCT in the last 72 hours. Sepsis Labs: Recent Labs  Lab 07/29/18 2104 07/30/18 0610 08/01/18 0434 08/03/18 0443  WBC 19.1* 16.3* 12.6* 14.2*   Microbiology No results found for this or any previous visit (from the past 240 hour(s)).   Medications:   . albuterol  2.5 mg Nebulization Daily  . amLODipine  5 mg Oral Daily  . aspirin EC  81 mg Oral Daily  . butamben-tetracaine-benzocaine      . cholecalciferol  1,000 Units Oral Daily  . enoxaparin (LOVENOX) injection  40 mg Subcutaneous Q24H  . escitalopram  20 mg Oral Daily  . ferrous sulfate  325 mg Oral Daily  . folic acid  1 mg Oral Daily  . gabapentin  600 mg Oral TID  . Gerhardt's butt cream   Topical BID  . insulin aspart  0-9 Units Subcutaneous Q4H  . iopamidol      . isosorbide mononitrate  30 mg Oral Daily  . losartan  50 mg Oral Daily  . magnesium oxide  400 mg Oral Daily  . Melatonin  10 mg Oral QHS  .  metaxalone  800 mg Oral 3 times per day  . metoprolol succinate  50 mg Oral Daily  . milk and molasses  1 enema Rectal Once  . mometasone-formoterol  2 puff Inhalation BID  . nortriptyline  10 mg Oral QHS  . oxyCODONE  15 mg Oral Q12H  . pantoprazole  40 mg Oral BID  . potassium chloride  10 mEq Oral BID  . rosuvastatin  5 mg Oral QHS  . senna  1-2 tablet Oral Daily  . tapentadol  50 mg Oral Q6H  . vitamin B-12  1,000 mcg Oral Daily   Continuous Infusions: . cefTRIAXone (ROCEPHIN)  IV 1 g (08/02/18 2204)  . dextrose 5 % and 0.45 % NaCl with KCl 40 mEq/L 75 mL/hr at 08/03/18 0624  . famotidine (PEPCID) IV 20 mg (08/02/18 2114)      LOS: 4 days   [facility] Pager [phone]  *Please refer to [email or web address], password [email or web address] to get updated schedule on who will round on this patient, as hospitalists switch teams weekly. If 7PM-7AM, please contact night-coverage at [email or web address], password [email or web address] for any overnight needs.  08/03/2018, 11:57 AM

## 2018-08-03 NOTE — Progress Notes (Signed)
NGT placed in rt nare tol well. Husband at bs.copious amt greenish drng out. adamantly refuses the mom/molases enema.

## 2018-08-04 ENCOUNTER — Inpatient Hospital Stay (HOSPITAL_COMMUNITY): Payer: Medicare Other

## 2018-08-04 DIAGNOSIS — N390 Urinary tract infection, site not specified: Secondary | ICD-10-CM

## 2018-08-04 DIAGNOSIS — K56609 Unspecified intestinal obstruction, unspecified as to partial versus complete obstruction: Secondary | ICD-10-CM

## 2018-08-04 DIAGNOSIS — T83511A Infection and inflammatory reaction due to indwelling urethral catheter, initial encounter: Secondary | ICD-10-CM

## 2018-08-04 DIAGNOSIS — Z0189 Encounter for other specified special examinations: Secondary | ICD-10-CM

## 2018-08-04 DIAGNOSIS — E119 Type 2 diabetes mellitus without complications: Secondary | ICD-10-CM

## 2018-08-04 DIAGNOSIS — E870 Hyperosmolality and hypernatremia: Secondary | ICD-10-CM

## 2018-08-04 LAB — CBC
HEMATOCRIT: 39.4 % (ref 36.0–46.0)
Hemoglobin: 12.6 g/dL (ref 12.0–15.0)
MCH: 29 pg (ref 26.0–34.0)
MCHC: 32 g/dL (ref 30.0–36.0)
MCV: 90.8 fL (ref 78.0–100.0)
PLATELETS: 307 10*3/uL (ref 150–400)
RBC: 4.34 MIL/uL (ref 3.87–5.11)
RDW: 16.7 % — AB (ref 11.5–15.5)
WBC: 14.4 10*3/uL — ABNORMAL HIGH (ref 4.0–10.5)

## 2018-08-04 LAB — GLUCOSE, CAPILLARY
GLUCOSE-CAPILLARY: 124 mg/dL — AB (ref 70–99)
GLUCOSE-CAPILLARY: 129 mg/dL — AB (ref 70–99)
GLUCOSE-CAPILLARY: 157 mg/dL — AB (ref 70–99)
Glucose-Capillary: 110 mg/dL — ABNORMAL HIGH (ref 70–99)
Glucose-Capillary: 112 mg/dL — ABNORMAL HIGH (ref 70–99)
Glucose-Capillary: 132 mg/dL — ABNORMAL HIGH (ref 70–99)
Glucose-Capillary: 150 mg/dL — ABNORMAL HIGH (ref 70–99)

## 2018-08-04 LAB — BASIC METABOLIC PANEL
ANION GAP: 10 (ref 5–15)
BUN: 10 mg/dL (ref 8–23)
CALCIUM: 9.5 mg/dL (ref 8.9–10.3)
CO2: 29 mmol/L (ref 22–32)
CREATININE: 1.05 mg/dL — AB (ref 0.44–1.00)
Chloride: 101 mmol/L (ref 98–111)
GFR, EST NON AFRICAN AMERICAN: 54 mL/min — AB (ref >60)
GLUCOSE: 173 mg/dL — AB (ref 70–99)
Potassium: 4.4 mmol/L (ref 3.5–5.1)
Sodium: 140 mmol/L (ref 135–145)

## 2018-08-04 MED ORDER — LIDOCAINE HCL URETHRAL/MUCOSAL 2 % EX GEL
1.0000 "application " | Freq: Once | CUTANEOUS | Status: AC
  Administered 2018-08-04: 1
  Filled 2018-08-04: qty 5

## 2018-08-04 MED ORDER — LORAZEPAM 2 MG/ML IJ SOLN
1.0000 mg | Freq: Once | INTRAMUSCULAR | Status: AC
  Administered 2018-08-04: 1 mg via INTRAVENOUS
  Filled 2018-08-04: qty 1

## 2018-08-04 MED ORDER — HALOPERIDOL LACTATE 5 MG/ML IJ SOLN
2.0000 mg | Freq: Once | INTRAMUSCULAR | Status: AC
  Administered 2018-08-04: 2 mg via INTRAVENOUS
  Filled 2018-08-04: qty 1

## 2018-08-04 NOTE — Progress Notes (Signed)
OT Cancellation Note  Patient Details Name: Lauren [name] MRN: [id number] DOB: 1951/06/16   Cancelled Treatment:    Reason Eval/Treat Not Completed: Other (comment).  Checked back this pm, and RN was getting ready to insert an NG tube. Will check back tomorrow.    Arnie Maiolo 08/04/2018, 2:45 PM  Lesle Chris, OTR/L [phone] 08/04/2018

## 2018-08-04 NOTE — Progress Notes (Signed)
Patient adamantly refuses to allow RNs to replace NG tube, even in restraints fighting staff, moving head side to side and refusing placement.  Caregiver/friend in room.  Patient states she does not care if she dies she does not want a NG tube replaced.  RN paging surgery to discuss any additional options.  Patient says she would need to be "knocked out" before NG could be placed.

## 2018-08-04 NOTE — Progress Notes (Signed)
Order written for NG tube and to apply restraints as patient has pulled out multiple NG tubes and requires this for conservative management of her SBO. Patient adamantly refuses to have RN place restraints or NG tube.  Friend/caregiver in the room, states patients husband and daughter are coming to hospital and they may be able to convince her to have NG placed.   Will hold off on restraints until NG tube placed as restraints are for sole purpose of preventing patient from pulling NG tube out.

## 2018-08-04 NOTE — Progress Notes (Signed)
Pt pulled out NG tube again. Multiple attempts made to place another, pt refuses, oncall provider made aware.

## 2018-08-04 NOTE — Progress Notes (Signed)
PROGRESS NOTE  Lauren [name] KYH:[id number] DOB: 03/14/1951 DOA: 07/29/2018 PCP: [name], MD  HPI/Recap of past 24 hours:  Lauren [name] is an 67 y.o. female past medical history of hypertension, hyperlipidemia, diabetes mellitus type 2, with a history of CVA, COPD psoriasis ventral hernia status post endometrial cancer comes in complaining of abdominal pain that started around 4 AM the day prior to admission she denies any fever chills diarrhea black stools bright red blood per rectum, CT scan of the abdomen and pelvis shows small bowel obstruction with a transition point lower than the ventral hernia.  08/04/2018: Patient seen and examined at her bedside.  Confusion noted.  Does not appears to be in distress.  General surgery following and recommends conservative management.  Assessment/Plan: Principal Problem:   SBO (small bowel obstruction) (HCC) Active Problems:   Diabetes mellitus without complication (HCC)   Acute lower UTI   Hypernatremia  Small bowel obstruction Patient has pulled out her NG tube Ideally she should have NG tube Continue enemas Poor surgical candidate  Surgery recommends conservative management Continue to monitor electrolytes  Accelerated hypertension Continue IV antihypertensive medications  Leukocytosis possibly secondary to SBO Monitor fever curve Monitor WBC Repeat CBC in the morning  UTI Completed treatment with 4 days of IV antibiotics  Ambulatory dysfunction PT recommend home health PT with 24 hours assistance Fall precautions   Code Status: Full code  Family Communication: None at bedside  Disposition Plan: Home versus SNF in 1 to 2 days when clinically stable.   Consultants:  General surgery  Procedures:  None  Antimicrobials:  None  DVT prophylaxis: Subcu Lovenox daily   Objective: Vitals:   08/03/18 2013 08/03/18 2229 08/04/18 0414 08/04/18 0423  BP: (!) 165/73 (!) 159/67  (!) 163/87  Pulse: 91 96   (!) 102  Resp: 16   19  Temp: 97.9 F (36.6 C)   99.5 F (37.5 C)  TempSrc: Oral   Oral  SpO2: 94%   94%  Weight:   90.7 kg (199 lb 15.3 oz)   Height:        Intake/Output Summary (Last 24 hours) at 08/04/2018 1224 Last data filed at 08/04/2018 1517 Gross per 24 hour  Intake 794.4 ml  Output 1500 ml  Net -705.6 ml   Filed Weights   08/02/18 0450 08/03/18 0524 08/04/18 0414  Weight: 93.5 kg (206 lb 2.1 oz) 91.2 kg (201 lb 1 oz) 90.7 kg (199 lb 15.3 oz)    Exam:  . General: 67 y.o. year-old female well developed well nourished in no acute distress.  Alert but confused. . Cardiovascular: Regular rate and rhythm with no rubs or gallops.  No thyromegaly or JVD noted.   Marland Kitchen Respiratory: Clear to auscultation with no wheezes or rales. Good inspiratory effort. . Abdomen: Soft nontender nondistended with hypoactive bowel sounds. . Musculoskeletal: No lower extremity edema. 2/4 pulses in all 4 extremities. Marland Kitchen Psychiatry: Mood is appropriate for condition and setting   Data Reviewed: CBC: Recent Labs  Lab 07/29/18 2104 07/30/18 0610 08/01/18 0434 08/03/18 0443 08/04/18 0438  WBC 19.1* 16.3* 12.6* 14.2* 14.4*  HGB 12.4 12.1 10.5* 11.0* 12.6  HCT 38.9 37.9 33.8* 35.0* 39.4  MCV 90.7 91.1 92.1 90.4 90.8  PLT 438* 432* 381 410* 616   Basic Metabolic Panel: Recent Labs  Lab 07/31/18 1211 08/01/18 0434 08/02/18 0441 08/03/18 0443 08/04/18 0438  NA 137 141 136 137 140  K 3.9 4.2 4.8 4.9 4.4  CL  100 101 102 99 101  CO2 28 29 26 28 29   GLUCOSE 131* 115* 166* 231* 173*  BUN 21 16 10 11 10   CREATININE 1.11* 1.17* 0.97 1.04* 1.05*  CALCIUM 8.6* 8.7* 8.9 9.0 9.5   GFR: Estimated Creatinine Clearance: 59 mL/min (A) (by C-G formula based on SCr of 1.05 mg/dL (H)). Liver Function Tests: Recent Labs  Lab 07/29/18 2104 07/30/18 0610  AST 24 21  ALT 25 22  ALKPHOS 62 56  BILITOT 0.7 0.6  PROT 7.7 7.3  ALBUMIN 4.0 3.6   Recent Labs  Lab 07/29/18 2104  LIPASE 23   No  results for input(s): AMMONIA in the last 168 hours. Coagulation Profile: No results for input(s): INR, PROTIME in the last 168 hours. Cardiac Enzymes: No results for input(s): CKTOTAL, CKMB, CKMBINDEX, TROPONINI in the last 168 hours. BNP (last 3 results) No results for input(s): PROBNP in the last 8760 hours. HbA1C: No results for input(s): HGBA1C in the last 72 hours. CBG: Recent Labs  Lab 08/03/18 2009 08/03/18 2353 08/04/18 0418 08/04/18 0813 08/04/18 1123  GLUCAP 129* 124* 157* 150* 132*   Lipid Profile: No results for input(s): CHOL, HDL, LDLCALC, TRIG, CHOLHDL, LDLDIRECT in the last 72 hours. Thyroid Function Tests: No results for input(s): TSH, T4TOTAL, FREET4, T3FREE, THYROIDAB in the last 72 hours. Anemia Panel: No results for input(s): VITAMINB12, FOLATE, FERRITIN, TIBC, IRON, RETICCTPCT in the last 72 hours. Urine analysis:    Component Value Date/Time   COLORURINE YELLOW 07/29/2018 2104   APPEARANCEUR CLOUDY (A) 07/29/2018 2104   LABSPEC 1.020 07/29/2018 2104   LABSPEC 1.010 02/12/2009 1035   PHURINE 5.0 07/29/2018 2104   GLUCOSEU NEGATIVE 07/29/2018 2104   HGBUR NEGATIVE 07/29/2018 2104   BILIRUBINUR NEGATIVE 07/29/2018 2104   BILIRUBINUR Negative 02/12/2009 1035   KETONESUR 5 (A) 07/29/2018 2104   PROTEINUR NEGATIVE 07/29/2018 2104   UROBILINOGEN 0.2 08/14/2015 1400   NITRITE NEGATIVE 07/29/2018 2104   LEUKOCYTESUR MODERATE (A) 07/29/2018 2104   LEUKOCYTESUR Large 02/12/2009 1035   Sepsis Labs: @LABRCNTIP (procalcitonin:4,lacticidven:4)  )No results found for this or any previous visit (from the past 240 hour(s)).    Studies: Dg Abd 1 View  Result Date: 08/03/2018 CLINICAL DATA:  Encounter for nasogastric tube placement EXAM: ABDOMEN - 1 VIEW COMPARISON:  08/02/2018 FINDINGS: Nasogastric tube tip projects at the distal stomach. Enteric contrast which at least partially outlines the colon. IMPRESSION: Nasogastric tube with tip over the distal stomach.  Electronically Signed   By: Monte Fantasia M.D.   On: 08/03/2018 18:15    Scheduled Meds: . albuterol  2.5 mg Nebulization Daily  . enoxaparin (LOVENOX) injection  40 mg Subcutaneous Q24H  . Gerhardt's butt cream   Topical BID  . insulin aspart  0-9 Units Subcutaneous Q4H  . metoprolol tartrate  5 mg Intravenous Q6H  . milk and molasses  1 enema Rectal Once  . mometasone-formoterol  2 puff Inhalation BID  . tapentadol  50 mg Oral Q6H    Continuous Infusions: . dextrose 5 % and 0.45 % NaCl with KCl 40 mEq/L 25 mL/hr at 08/04/18 1005  . famotidine (PEPCID) IV 20 mg (08/04/18 1015)     LOS: 5 days     Kayleen Memos, MD [facility] Pager [phone]  If 7PM-7AM, please contact night-coverage [email or web address] Password [facility] 08/04/2018, 12:24 PM

## 2018-08-04 NOTE — Progress Notes (Signed)
OT Cancellation Note  Patient Details Name: SYNIYAH BOURNE MRN: [id number] DOB: 05/06/1951   Cancelled Treatment:    Reason Eval/Treat Not Completed: Other (comment). Nursing is in with pt and she is crying. Will check back later.  Jakub Debold 08/04/2018, 10:34 AM  Lesle Chris, OTR/L [phone] 08/04/2018

## 2018-08-04 NOTE — Plan of Care (Signed)
Patient medicated for pain, both acute and chronic multiple times on 7 a to 7 p shift with some improvement.  NG replaced at approximately 2 p.m., has remained intact so far with approximately 450 ccs green output.  Husband and daughter at bedside.

## 2018-08-04 NOTE — Progress Notes (Signed)
Pt pulled out NG tube, given Ativan one time order, new NG tube placed. Pt placed on tele-sitter monitor.

## 2018-08-04 NOTE — Progress Notes (Signed)
Patient ID: Lauren [name], female   DOB: 1951-07-27, 67 y.o.   MRN: [id number]       Subjective: Pt in mittens.  When asked why she is pulling her NGT out repeatedly and refusing her enema she just says "I don't know!"  When asked orientation questions she very nonchalantly answers "I don't know where I am, I don't know the year, I don't know the president"  She doesn't take time to think about it.  She almost seems to be not answering the questions on purpose, but i'm not sure what the gain of that would be.  Objective: Vital signs in last 24 hours: Temp:  [97.9 F (36.6 C)-99.5 F (37.5 C)] 99.5 F (37.5 C) (08/07 0423) Pulse Rate:  [90-102] 102 (08/07 0423) Resp:  [16-19] 19 (08/07 0423) BP: (154-165)/(64-87) 163/87 (08/07 0423) SpO2:  [94 %-96 %] 94 % (08/07 0423) Weight:  [90.7 kg (199 lb 15.3 oz)] 90.7 kg (199 lb 15.3 oz) (08/07 0414) Last BM Date: 08/03/18  Intake/Output from previous day: 08/06 0701 - 08/07 0700 In: 748.4 [P.O.:120; I.V.:228.4; IV Piggyback:400] Out: 1500 [Urine:600; Emesis/NG output:900] Intake/Output this shift: Total I/O In: 332.4 [I.V.:332.4] Out: -   PE: Heart: regular Lungs: CTAB Abd: soft, hernia is soft, +BS, obese, NT  Lab Results:  Recent Labs    08/03/18 0443 08/04/18 0438  WBC 14.2* 14.4*  HGB 11.0* 12.6  HCT 35.0* 39.4  PLT 410* 307   BMET Recent Labs    08/03/18 0443 08/04/18 0438  NA 137 140  K 4.9 4.4  CL 99 101  CO2 28 29  GLUCOSE 231* 173*  BUN 11 10  CREATININE 1.04* 1.05*  CALCIUM 9.0 9.5   PT/INR No results for input(s): LABPROT, INR in the last 72 hours. CMP     Component Value Date/Time   NA 140 08/04/2018 0438   NA 142 01/28/2018 1035   K 4.4 08/04/2018 0438   CL 101 08/04/2018 0438   CO2 29 08/04/2018 0438   GLUCOSE 173 (H) 08/04/2018 0438   BUN 10 08/04/2018 0438   BUN 19 01/28/2018 1035   BUN 14.7 07/18/2014 1142   CREATININE 1.05 (H) 08/04/2018 0438   CREATININE 1.0 07/18/2014 1142   CALCIUM 9.5 08/04/2018 0438   PROT 7.3 07/30/2018 0610   PROT 6.5 03/10/2017 1656   ALBUMIN 3.6 07/30/2018 0610   AST 21 07/30/2018 0610   ALT 22 07/30/2018 0610   ALKPHOS 56 07/30/2018 0610   BILITOT 0.6 07/30/2018 0610   GFRNONAA 54 (L) 08/04/2018 0438   GFRAA >60 08/04/2018 0438   Lipase     Component Value Date/Time   LIPASE 23 07/29/2018 2104       Studies/Results: Dg Abd 1 View  Result Date: 08/03/2018 CLINICAL DATA:  Encounter for nasogastric tube placement EXAM: ABDOMEN - 1 VIEW COMPARISON:  08/02/2018 FINDINGS: Nasogastric tube tip projects at the distal stomach. Enteric contrast which at least partially outlines the colon. IMPRESSION: Nasogastric tube with tip over the distal stomach. Electronically Signed   By: Monte Fantasia M.D.   On: 08/03/2018 18:15   Dg Abd 1 View  Result Date: 08/02/2018 CLINICAL DATA:  67 year old female with a history of bowel obstruction EXAM: ABDOMEN - 1 VIEW COMPARISON:  Multiple prior plain film, with most recent 08/01/2018. Prior CT 07/30/2018 FINDINGS: Limited plain film abdomen. There has been removal of the gastric tube. Gas within stomach. Multiple dilated small bowel loops within the mid abdomen, right abdomen, left abdomen.  Enteric contrast present within the hepatic flexure. Decompressed left colon, with small enteric contrast at the splenic flexure. Distal colon relatively decompressed. Surgical changes of the lumbar region. IMPRESSION: Plain film evidence of partial small bowel obstruction. Gastric tube is been removed. Electronically Signed   By: Corrie Mckusick D.O.   On: 08/02/2018 11:10   Dg Fluoro Rm 1-60 Min  Result Date: 08/03/2018 CLINICAL DATA:  67 year old female with nasogastric tube removed. Initial encounter. EXAM: FLOURO RM 1-60 MIN CONTRAST:  None. FLUOROSCOPY TIME:  Fluoroscopy Time:  3 seconds Radiation Exposure Index: 0.4 mGy COMPARISON:  August 02, 2018. FINDINGS: After 3 attempts of placing 37 French nasogastric tube,  patient grabbed tube and removed the tube. Patient was not able to proceed with placement of tube. Gas-filled stomach noted on fluoroscopy. IMPRESSION: Patient not able to cooperate with placement of nasogastric tube. Electronically Signed   By: Genia Del M.D.   On: 08/03/2018 09:42    Anti-infectives: Anti-infectives (From admission, onward)   Start     Dose/Rate Route Frequency Ordered Stop   07/30/18 2200  cefTRIAXone (ROCEPHIN) 1 g in sodium chloride 0.9 % 100 mL IVPB  Status:  Discontinued     1 g 200 mL/hr over 30 Minutes Intravenous Every 24 hours 07/30/18 0253 08/03/18 1205   07/30/18 0200  cefTRIAXone (ROCEPHIN) 1 g in sodium chloride 0.9 % 100 mL IVPB     1 g 200 mL/hr over 30 Minutes Intravenous  Once 07/30/18 0151 07/30/18 0322       Assessment/Plan H/O uterine ca, s/p hysterectomy CVA Obesity HTN DM COPD CAD  SBO secondary to chronically incarcerated large ventral incisional hernia - patient continues to pull NGT out and refuses enemas and care that we are trying to provide to get her better without an operation.  She would not tolerate an operation very well as she has lost her abdominal domain and trying to close her abdomen would be incredibly difficult.   -she seems disoriented this morning, but they way she answers seems insincere and almost like she is just refusing to answer the question.  She does not know why she is interfering in her care by refusing her enemas and repeatedly pulling out her NGT.  Because of her presumed delirium, I will write for soft wrist restraints and have her NGT replaced so she is hopefully unable to pull her NGT out and allow her to have decompression and possibly get better.   -she states she is agreeable today to proceed with the enema ordered yesterday. -will follow  ID - rocephin 8/2>> FEN - IVF, NPO, replace NG tube VTE - SCDs, lovenox Foley - in place  Plan - soft wrist restraints, replace NGT, enema, try to get her  better with conservative management.     LOS: 5 days    Henreitta Cea , [facility] 08/04/2018, 8:06 AM Pager: [phone]

## 2018-08-05 LAB — CBC
HCT: 36.2 % (ref 36.0–46.0)
Hemoglobin: 11.5 g/dL — ABNORMAL LOW (ref 12.0–15.0)
MCH: 28.7 pg (ref 26.0–34.0)
MCHC: 31.8 g/dL (ref 30.0–36.0)
MCV: 90.3 fL (ref 78.0–100.0)
Platelets: 398 10*3/uL (ref 150–400)
RBC: 4.01 MIL/uL (ref 3.87–5.11)
RDW: 16.8 % — ABNORMAL HIGH (ref 11.5–15.5)
WBC: 12.4 10*3/uL — ABNORMAL HIGH (ref 4.0–10.5)

## 2018-08-05 LAB — GLUCOSE, CAPILLARY
GLUCOSE-CAPILLARY: 123 mg/dL — AB (ref 70–99)
Glucose-Capillary: 121 mg/dL — ABNORMAL HIGH (ref 70–99)
Glucose-Capillary: 137 mg/dL — ABNORMAL HIGH (ref 70–99)
Glucose-Capillary: 135 mg/dL — ABNORMAL HIGH (ref 70–99)
Glucose-Capillary: 130 mg/dL — ABNORMAL HIGH (ref 70–99)
Glucose-Capillary: 127 mg/dL — ABNORMAL HIGH (ref 70–99)

## 2018-08-05 NOTE — Progress Notes (Signed)
Physical Therapy Treatment Patient Details Name: Lauren [name] MRN: [id number] DOB: 1951/01/20 Today's Date: 08/05/2018    History of Present Illness 67 y.o. female past medical history of hypertension, hyperlipidemia, diabetes mellitus type 2, CVA, COPD, multiple back surgeries, ventral hernia, status post endometrial cancer comes in complaining of abdominal pain and admitted for SBO secondary to chronically incarcerated large ventral incisional hernia     PT Comments    Pt in bed with B wrist restraints, NG tube, cath, IV and friend Benjamine Mola in room.  Pt AxO x 2 with slight cognition impairment about current condition.  Also, pt required MAX encouragement to participate.  Quick to say "I can't".  Assisted to EOB pt did well.  Assisted to attempt amb however pt was reluctant to progress past a few steps.  MAX encouragement to get to advance to 2 feet.  Pt was able to support self but difficult to tolerate WBing R LE due to foot pain "sore". Positioned in recliner and left wrist restraints off.  RN notified.   Follow Up Recommendations  Supervision/Assistance - 24 hour;Home health PT     Equipment Recommendations  None recommended by PT    Recommendations for Other Services       Precautions / Restrictions Precautions Precautions: Fall Required Braces or Orthoses: Other Brace/Splint Other Brace/Splint: has AFO in L shoe Restrictions Weight Bearing Restrictions: No    Mobility  Bed Mobility Overal bed mobility: Needs Assistance Bed Mobility: Supine to Sit     Supine to sit: Mod assist;HOB elevated Sit to supine: Mod assist;+2 for safety/equipment   General bed mobility comments: assist for legs and guided trunk back to bed and assist for both for up. Rolled with min A to straighten pad. Used bedrail  Transfers Overall transfer level: Needs assistance Equipment used: Rolling walker (2 wheeled) Transfers: Sit to/from [facility] Sit to Stand: Min  assist;+2 safety/equipment         General transfer comment: increased time from elevated bed  Ambulation/Gait Ambulation/Gait assistance: Min assist;Mod assist;+2 physical assistance;+2 safety/equipment Gait Distance (Feet): 2 Feet Assistive device: Rolling walker (2 wheeled) Gait Pattern/deviations: Step-to pattern;Decreased step length - right;Decreased step length - left Gait velocity: decreased   General Gait Details: MAX encouragement as pt had multiple excuses but was able to weight shift and support self self.  Present with some anxiety and fear of falling.  Lexine Baton assisted by following with recliner.     Stairs             Wheelchair Mobility    Modified Rankin (Stroke Patients Only)       Balance                                            Cognition Arousal/Alertness: Awake/alert Behavior During Therapy: WFL for tasks assessed/performed Overall Cognitive Status: Impaired/Different from baseline                                 General Comments: cooperative but with much encouragement      Exercises      General Comments        Pertinent Vitals/Pain Pain Assessment: No/denies pain Faces Pain Scale: No hurt Pain Location: bil feet with donning shoes Pain Descriptors / Indicators: Tender;Discomfort;Grimacing Pain Intervention(s): Monitored during session;Repositioned  Home Living Family/patient expects to be discharged to:: Private residence Living Arrangements: Spouse/significant other Available Help at Discharge: Family;Personal care attendant         Home Equipment: Gilford Rile - 2 wheels;Transport chair;Shower seat      Prior Function Level of Independence: Needs assistance  Gait / Transfers Assistance Needed: pt sleeps in her recliner and uses a RW to ambulate short distances.  Uses transport chair for longer distances. Husband helps with a lot of her basic mobility.  Pt has not been OOB very much  approximately a week leading to admission ADL's / Homemaking Assistance Needed: aide assists with LB adls     PT Goals (current goals can now be found in the care plan section) Acute Rehab PT Goals Patient Stated Goal: none stated Progress towards PT goals: Progressing toward goals    Frequency    Min 3X/week      PT Plan Current plan remains appropriate    Co-evaluation              AM-PAC PT "6 Clicks" Daily Activity  Outcome Measure  Difficulty turning over in bed (including adjusting bedclothes, sheets and blankets)?: A Lot Difficulty moving from lying on back to sitting on the side of the bed? : A Lot Difficulty sitting down on and standing up from a chair with arms (e.g., wheelchair, bedside commode, etc,.)?: A Lot Help needed moving to and from a bed to chair (including a wheelchair)?: A Lot Help needed walking in hospital room?: A Lot Help needed climbing 3-5 steps with a railing? : A Lot 6 Click Score: 12    End of Session Equipment Utilized During Treatment: Gait belt Activity Tolerance: Patient tolerated treatment well Patient left: with call bell/phone within reach;in chair;with chair alarm set;with family/visitor present Nurse Communication: Mobility status PT Visit Diagnosis: Difficulty in walking, not elsewhere classified (R26.2);Muscle weakness (generalized) (M62.81)     Time: 1610-9604 PT Time Calculation (min) (ACUTE ONLY): 32 min  Charges:  $Gait Training: 8-22 mins $Therapeutic Activity: 8-22 mins                     Rica Koyanagi  PTA [facility]  Acute  Rehab Pager      [phone]

## 2018-08-05 NOTE — Evaluation (Signed)
Occupational Therapy Evaluation Patient Details Name: Lauren [name] MRN: [id number] DOB: 1951-04-02 Today's Date: 08/05/2018    History of Present Illness 67 y.o. female past medical history of hypertension, hyperlipidemia, diabetes mellitus type 2, CVA, COPD, multiple back surgeries, ventral hernia, status post endometrial cancer comes in complaining of abdominal pain and admitted for SBO secondary to chronically incarcerated large ventral incisional hernia    Clinical Impression   Pt was admitted for the above.  Caregiver was present at time of evaluation:  She helps pt get into walk in tub and helps with LB adls extensively. Sat EOB this visit.  Will follow in acute setting focusing on toilet transfers and standing for adls.      Follow Up Recommendations  Supervision/Assistance - 24 hour;Home health OT(as long as family can manage)    Equipment Recommendations  (to be further assessed)    Recommendations for Other Services       Precautions / Restrictions Precautions Precautions: Fall Other Brace/Splint: has AFO in L shoe Restrictions Weight Bearing Restrictions: No      Mobility Bed Mobility         Supine to sit: Mod assist;HOB elevated Sit to supine: Mod assist;+2 for safety/equipment   General bed mobility comments: assist for legs and guided trunk back to bed and assist for both for up. Rolled with min A to straighten pad. Used bedrail  Transfers                 General transfer comment: not performed today: she was min +2 with PT during their evaluation    Balance                                           ADL either performed or assessed with clinical judgement   ADL Overall ADL's : Needs assistance/impaired                                       General ADL Comments: pt is near baseline for ADLs; set up for UB and max A for LB bathing/total A for LB dressing.  SPT not performed.  Aide believes that walk in tub  transfers may be difficult after this hospitalization     Vision         Perception     Praxis      Pertinent Vitals/Pain Pain Assessment: Faces Faces Pain Scale: No hurt     Hand Dominance     Extremity/Trunk Assessment Upper Extremity Assessment Upper Extremity Assessment: Generalized weakness;LUE deficits/detail LUE Deficits / Details: reports RC problem; FF to 90           Communication Communication Communication: No difficulties   Cognition Arousal/Alertness: Awake/alert Behavior During Therapy: WFL for tasks assessed/performed Overall Cognitive Status: Impaired/Different from baseline                                 General Comments: aide present; pt answered PLOF questions incorrectly.  Pt oriented and NOT pulling at lines when restraints off     General Comments       Exercises     Shoulder Instructions      Home Living Family/patient expects to be discharged to:: Private residence Living Arrangements:  Spouse/significant other Available Help at Discharge: Family;Personal care attendant               Bathroom Shower/Tub: (walk in tub)   Bathroom Toilet: Handicapped height     Home Equipment: Environmental consultant - 2 wheels;Transport chair;Shower seat          Prior Functioning/Environment Level of Independence: Needs assistance  Gait / Transfers Assistance Needed: pt sleeps in her recliner and uses a RW to ambulate short distances.  Uses transport chair for longer distances. Husband helps with a lot of her basic mobility.  Pt has not been OOB very much approximately a week leading to admission ADL's / Homemaking Assistance Needed: aide assists with LB adls            OT Problem List: Decreased strength;Decreased activity tolerance;Impaired balance (sitting and/or standing);Decreased knowledge of use of DME or AE;Decreased cognition      OT Treatment/Interventions: Self-care/ADL training;DME and/or AE instruction;Patient/family  education;Balance training;Therapeutic activities;Cognitive remediation/compensation    OT Goals(Current goals can be found in the care plan section) Acute Rehab OT Goals Patient Stated Goal: none stated OT Goal Formulation: With patient Time For Goal Achievement: 08/19/18 Potential to Achieve Goals: Good ADL Goals Pt Will Transfer to Toilet: with min assist;stand pivot transfer;bedside commode Pt Will Perform Toileting - Clothing Manipulation and hygiene: with min assist;sit to/from stand;sitting/lateral leans  OT Frequency: Min 2X/week   Barriers to D/C:            Co-evaluation              AM-PAC PT "6 Clicks" Daily Activity     Outcome Measure Help from another person eating meals?: None Help from another person taking care of personal grooming?: A Little Help from another person toileting, which includes using toliet, bedpan, or urinal?: A Lot Help from another person bathing (including washing, rinsing, drying)?: A Lot Help from another person to put on and taking off regular upper body clothing?: A Little Help from another person to put on and taking off regular lower body clothing?: Total 6 Click Score: 15   End of Session    Activity Tolerance: Patient tolerated treatment well Patient left: in bed;with call bell/phone within reach;with bed alarm set;with family/visitor present;with nursing/sitter in room;with restraints reapplied  OT Visit Diagnosis: Muscle weakness (generalized) (M62.81)                Time: 1610-9604 OT Time Calculation (min): 21 min Charges:  OT General Charges $OT Visit: 1 Visit OT Evaluation $OT Eval Moderate Complexity: [address], OTR/L [phone] 08/05/2018  Lauren [name] 08/05/2018, 4:30 PM

## 2018-08-05 NOTE — Progress Notes (Signed)
Patient ID: Lauren [name], female   DOB: 1950/12/30, 67 y.o.   MRN: [id number]       Subjective: Pt in restraints this morning but has had her NGT for the longest time so far, since 1400 yesterday.  She states that she has passed flatus.  She is completely alert and oriented today.  Objective: Vital signs in last 24 hours: Temp:  [98.3 F (36.8 C)-99.3 F (37.4 C)] 98.3 F (36.8 C) (08/08 0405) Pulse Rate:  [76-97] 76 (08/08 0601) Resp:  [18-20] 20 (08/08 0405) BP: (148-170)/(73-87) 164/87 (08/08 0601) SpO2:  [95 %-97 %] 95 % (08/08 0405) Weight:  [90.4 kg] 90.4 kg (08/08 0405) Last BM Date: 08/03/18  Intake/Output from previous day: 08/07 0701 - 08/08 0700 In: 1175.2 [I.V.:751.1; IV Piggyback:424.2] Out: 1450 [Urine:850; Emesis/NG output:600] Intake/Output this shift: No intake/output data recorded.  PE: Heart: regular Lungs: CTAB Abd: soft, NT, +BS, hernia present and still soft.  Lab Results:  Recent Labs    08/04/18 0438 08/05/18 0424  WBC 14.4* 12.4*  HGB 12.6 11.5*  HCT 39.4 36.2  PLT 307 398   BMET Recent Labs    08/03/18 0443 08/04/18 0438  NA 137 140  K 4.9 4.4  CL 99 101  CO2 28 29  GLUCOSE 231* 173*  BUN 11 10  CREATININE 1.04* 1.05*  CALCIUM 9.0 9.5   PT/INR No results for input(s): LABPROT, INR in the last 72 hours. CMP     Component Value Date/Time   NA 140 08/04/2018 0438   NA 142 01/28/2018 1035   K 4.4 08/04/2018 0438   CL 101 08/04/2018 0438   CO2 29 08/04/2018 0438   GLUCOSE 173 (H) 08/04/2018 0438   BUN 10 08/04/2018 0438   BUN 19 01/28/2018 1035   BUN 14.7 07/18/2014 1142   CREATININE 1.05 (H) 08/04/2018 0438   CREATININE 1.0 07/18/2014 1142   CALCIUM 9.5 08/04/2018 0438   PROT 7.3 07/30/2018 0610   PROT 6.5 03/10/2017 1656   ALBUMIN 3.6 07/30/2018 0610   AST 21 07/30/2018 0610   ALT 22 07/30/2018 0610   ALKPHOS 56 07/30/2018 0610   BILITOT 0.6 07/30/2018 0610   GFRNONAA 54 (L) 08/04/2018 0438   GFRAA >60  08/04/2018 0438   Lipase     Component Value Date/Time   LIPASE 23 07/29/2018 2104       Studies/Results: Dg Abd 1 View  Result Date: 08/03/2018 CLINICAL DATA:  Encounter for nasogastric tube placement EXAM: ABDOMEN - 1 VIEW COMPARISON:  08/02/2018 FINDINGS: Nasogastric tube tip projects at the distal stomach. Enteric contrast which at least partially outlines the colon. IMPRESSION: Nasogastric tube with tip over the distal stomach. Electronically Signed   By: Monte Fantasia M.D.   On: 08/03/2018 18:15   Dg Fluoro Rm 1-60 Min  Result Date: 08/03/2018 CLINICAL DATA:  67 year old female with a history of nasogastric  tube placement female with nasogastric tube removed. Initial encounter. female with nasogastric tube removed. Initial encounter. EXAM: FLOURO RM 1-60 MIN CONTRAST:  None. FLUOROSCOPY TIME:  Fluoroscopy Time:  3 seconds Radiation Exposure Index: 0.4 mGy COMPARISON:  August 02, 2018. FINDINGS: After 3 attempts of placing 36 French nasogastric tube, patient grabbed tube and removed the tube. Patient was not able to proceed with placement of tube. Gas-filled stomach noted on fluoroscopy. IMPRESSION: Patient not able to cooperate with placement of nasogastric tube. Electronically Signed   By: Genia Del M.D.   On: 08/03/2018 09:42   Dg Abd Portable 1v  Result Date: 08/04/2018 CLINICAL DATA:  67 year old female with a history of nasogastric  tube placement female with nasogastric tube removed. Initial encounter. female with a history of nasogastric  tube placement EXAM: PORTABLE ABDOMEN - 1 VIEW COMPARISON:  08/03/2018 FINDINGS: Limited plain film of the abdomen demonstrates gastric tube terminating in the stomach. Side port is beyond the GE junction. Surgical changes of right cholecystectomy. Surgical changes of the mid lower abdomen. Retained enteric contrast, similar to the prior. No significantly distended small bowel or colon. IMPRESSION: Gastric tube terminates within the stomach. Retained enteric contrast. Electronically Signed   By: Corrie Mckusick D.O.   On: 08/04/2018 14:58    Anti-infectives: Anti-infectives (From admission, onward)   Start     Dose/Rate Route Frequency Ordered Stop    07/30/18 2200  cefTRIAXone (ROCEPHIN) 1 g in sodium chloride 0.9 % 100 mL IVPB  Status:  Discontinued     1 g 200 mL/hr over 30 Minutes Intravenous Every 24 hours 07/30/18 0253 08/03/18 1205   07/30/18 0200  cefTRIAXone (ROCEPHIN) 1 g in sodium chloride 0.9 % 100 mL IVPB     1 g 200 mL/hr over 30 Minutes Intravenous  Once 07/30/18 0151 07/30/18 0322       Assessment/Plan H/O uterine ca, s/p hysterectomy CVA Obesity HTN DM COPD CAD  SBO secondary to chronically incarcerated large ventral incisional hernia -patient states once again she is agreeable to get enema today. -requests restraints be removed.  States " I will be good and I won't pull my tube out."  We discussed that her past history suggests otherwise and if we are going to get her better without an operation then she needs to keep to her restraints today to be compliant with medical care so hopefully she will improve and we can DC everything soon.  She understands and at least states that she is agreeable for now. -repeat films in am.  ID -rocephin 8/2, currently none FEN -IVF, NPO, NG tube VTE -SCDs, lovenox Foley -in place  Plan- soft wrist restraints,  NGT, enema, try to get her better with conservative management.  Repeat film in am   LOS: 6 days    Henreitta Cea , [facility] 08/05/2018, 9:07 AM Pager: [phone]

## 2018-08-05 NOTE — Progress Notes (Signed)
PROGRESS NOTE  Lauren [name]:[id number] DOB: 01/14/1951 DOA: 07/29/2018 PCP: [name], MD  HPI/Recap of past 24 hours:  Lauren [name] is an 67 y.o. female past medical history of hypertension, hyperlipidemia, diabetes mellitus type 2, with a history of CVA, COPD psoriasis ventral hernia status post endometrial cancer comes in complaining of abdominal pain that started around 4 AM the day prior to admission she denies any fever chills diarrhea black stools bright red blood per rectum, CT scan of the abdomen and pelvis shows small bowel obstruction with a transition point lower than the ventral hernia.  08/04/2018: Patient seen and examined at her bedside.  Confusion noted.  Does not appears to be in distress.  General surgery following and recommends conservative management.  08/05/18: Seen and examined with her home sitter at bedside.  She is alert and oriented x3.  NG-tube in place to suction and draining well.  She is on restraints.  Assessment/Plan: Principal Problem:   SBO (small bowel obstruction) (HCC) Active Problems:   Diabetes mellitus without complication (HCC)   Acute lower UTI   Hypernatremia  Small bowel obstruction NG tube in place to suction Monitor output Continue enemas Poor surgical candidate  Surgery recommends conservative management Continue to monitor electrolytes Repeat abdominal x-ray in the morning  Accelerated hypertension Continue IV antihypertensive medications  Improving leukocytosis possibly secondary to SBO Monitor fever curve Monitor WBC Repeat CBC in the morning  UTI Completed treatment with 4 days of IV antibiotics  Ambulatory dysfunction PT recommend home health PT with 24 hours assistance Fall precautions   Code Status: Full code  Family Communication: None at bedside  Disposition Plan: Home versus SNF in 1 to 2 days when clinically stable.   Consultants:  General  surgery  Procedures:  None  Antimicrobials:  None  DVT prophylaxis: Subcu Lovenox daily   Objective: Vitals:   08/04/18 2007 08/05/18 0405 08/05/18 0601 08/05/18 1226  BP: (!) 153/83 (!) 170/73 (!) 164/87 (!) 167/89  Pulse: 89 84 76 92  Resp: 18 20  20   Temp: 99.3 F (37.4 C) 98.3 F (36.8 C)  98.7 F (37.1 C)  TempSrc: Oral Oral  Oral  SpO2: 96% 95%  98%  Weight:  90.4 kg    Height:        Intake/Output Summary (Last 24 hours) at 08/05/2018 1423 Last data filed at 08/05/2018 1330 Gross per 24 hour  Intake 333.99 ml  Output 2625 ml  Net -2291.01 ml   Filed Weights   08/03/18 0524 08/04/18 0414 08/05/18 0405  Weight: 91.2 kg 90.7 kg 90.4 kg    Exam:  . General: 67 y.o. year-old female obese in no acute distress.  Alert and oriented x3.   . Cardiovascular: Regular rate and rhythm with no rubs or gallops.  No JVD or thyromegaly noted. Marland Kitchen Respiratory: Clear to auscultation with no wheezes or rales. Good inspiratory effort. . Abdomen: Soft nontender nondistended with hypoactive bowel sounds. . Musculoskeletal: No lower extremity edema. 2/4 pulses in all 4 extremities. Marland Kitchen Psychiatry: Mood is appropriate for condition and setting   Data Reviewed: CBC: Recent Labs  Lab 07/30/18 0610 08/01/18 0434 08/03/18 0443 08/04/18 0438 08/05/18 0424  WBC 16.3* 12.6* 14.2* 14.4* 12.4*  HGB 12.1 10.5* 11.0* 12.6 11.5*  HCT 37.9 33.8* 35.0* 39.4 36.2  MCV 91.1 92.1 90.4 90.8 90.3  PLT 432* 381 410* 307 283   Basic Metabolic Panel: Recent Labs  Lab 07/31/18 1211 08/01/18 0434 08/02/18 0441 08/03/18  0443 08/04/18 0438  NA 137 141 136 137 140  K 3.9 4.2 4.8 4.9 4.4  CL 100 101 102 99 101  CO2 28 29 26 28 29   GLUCOSE 131* 115* 166* 231* 173*  BUN 21 16 10 11 10   CREATININE 1.11* 1.17* 0.97 1.04* 1.05*  CALCIUM 8.6* 8.7* 8.9 9.0 9.5   GFR: Estimated Creatinine Clearance: 58.8 mL/min (A) (by C-G formula based on SCr of 1.05 mg/dL (H)). Liver Function Tests: Recent  Labs  Lab 07/29/18 2104 07/30/18 0610  AST 24 21  ALT 25 22  ALKPHOS 62 56  BILITOT 0.7 0.6  PROT 7.7 7.3  ALBUMIN 4.0 3.6   Recent Labs  Lab 07/29/18 2104  LIPASE 23   No results for input(s): AMMONIA in the last 168 hours. Coagulation Profile: No results for input(s): INR, PROTIME in the last 168 hours. Cardiac Enzymes: No results for input(s): CKTOTAL, CKMB, CKMBINDEX, TROPONINI in the last 168 hours. BNP (last 3 results) No results for input(s): PROBNP in the last 8760 hours. HbA1C: No results for input(s): HGBA1C in the last 72 hours. CBG: Recent Labs  Lab 08/04/18 1646 08/04/18 2004 08/04/18 2340 08/05/18 0401 08/05/18 1145  GLUCAP 129* 110* 112* 121* 135*   Lipid Profile: No results for input(s): CHOL, HDL, LDLCALC, TRIG, CHOLHDL, LDLDIRECT in the last 72 hours. Thyroid Function Tests: No results for input(s): TSH, T4TOTAL, FREET4, T3FREE, THYROIDAB in the last 72 hours. Anemia Panel: No results for input(s): VITAMINB12, FOLATE, FERRITIN, TIBC, IRON, RETICCTPCT in the last 72 hours. Urine analysis:    Component Value Date/Time   COLORURINE YELLOW 07/29/2018 2104   APPEARANCEUR CLOUDY (A) 07/29/2018 2104   LABSPEC 1.020 07/29/2018 2104   LABSPEC 1.010 02/12/2009 1035   PHURINE 5.0 07/29/2018 2104   GLUCOSEU NEGATIVE 07/29/2018 2104   HGBUR NEGATIVE 07/29/2018 2104   BILIRUBINUR NEGATIVE 07/29/2018 2104   BILIRUBINUR Negative 02/12/2009 1035   KETONESUR 5 (A) 07/29/2018 2104   PROTEINUR NEGATIVE 07/29/2018 2104   UROBILINOGEN 0.2 08/14/2015 1400   NITRITE NEGATIVE 07/29/2018 2104   LEUKOCYTESUR MODERATE (A) 07/29/2018 2104   LEUKOCYTESUR Large 02/12/2009 1035   Sepsis Labs: @LABRCNTIP (procalcitonin:4,lacticidven:4)  )No results found for this or any previous visit (from the past 240 hour(s)).    Studies: Dg Abd Portable 1v  Result Date: 08/04/2018 CLINICAL DATA:  67 year old female with a history of nasogastric tube placement EXAM: PORTABLE  ABDOMEN - 1 VIEW COMPARISON:  08/03/2018 FINDINGS: Limited plain film of the abdomen demonstrates gastric tube terminating in the stomach. Side port is beyond the GE junction. Surgical changes of right cholecystectomy. Surgical changes of the mid lower abdomen. Retained enteric contrast, similar to the prior. No significantly distended small bowel or colon. IMPRESSION: Gastric tube terminates within the stomach. Retained enteric contrast. Electronically Signed   By: Corrie Mckusick D.O.   On: 08/04/2018 14:58    Scheduled Meds: . albuterol  2.5 mg Nebulization Daily  . enoxaparin (LOVENOX) injection  40 mg Subcutaneous Q24H  . Gerhardt's butt cream   Topical BID  . insulin aspart  0-9 Units Subcutaneous Q4H  . metoprolol tartrate  5 mg Intravenous Q6H  . milk and molasses  1 enema Rectal Once  . mometasone-formoterol  2 puff Inhalation BID  . tapentadol  50 mg Oral Q6H    Continuous Infusions: . dextrose 5 % and 0.45 % NaCl with KCl 40 mEq/L 25 mL/hr at 08/05/18 0335  . famotidine (PEPCID) IV 20 mg (08/05/18 1352)  LOS: 6 days     Kayleen Memos, MD [facility] Pager [phone]  If 7PM-7AM, please contact night-coverage [email or web address] Password [facility] 08/05/2018, 2:23 PM

## 2018-08-05 NOTE — Care Management Important Message (Signed)
Important Message  Patient Details  Name: Lauren [name] MRN: [id number] Date of Birth: [birth year]   Medicare Important Message Given:  Yes    Kerin Salen 08/05/2018, 12:18 [place] Message  Patient Details  Name: Lauren [name] MRN: [id number] Date of Birth: Dec 05, 1951   Medicare Important Message Given:  Yes    Kerin Salen 08/05/2018, 12:18 PM

## 2018-08-06 ENCOUNTER — Encounter: Payer: Medicare Other | Admitting: Vascular Surgery

## 2018-08-06 ENCOUNTER — Inpatient Hospital Stay (HOSPITAL_COMMUNITY): Payer: Medicare Other

## 2018-08-06 LAB — BASIC METABOLIC PANEL
Anion gap: 10 (ref 5–15)
BUN: 12 mg/dL (ref 8–23)
CALCIUM: 8.9 mg/dL (ref 8.9–10.3)
CO2: 26 mmol/L (ref 22–32)
CREATININE: 0.94 mg/dL (ref 0.44–1.00)
Chloride: 102 mmol/L (ref 98–111)
GFR calc Af Amer: >60 mL/min (ref >60)
Glucose, Bld: 161 mg/dL — ABNORMAL HIGH (ref 70–99)
Potassium: 4.1 mmol/L (ref 3.5–5.1)
SODIUM: 138 mmol/L (ref 135–145)

## 2018-08-06 LAB — CREATININE, SERUM
CREATININE: 0.89 mg/dL (ref 0.44–1.00)
GFR calc non Af Amer: >60 mL/min (ref >60)

## 2018-08-06 LAB — GLUCOSE, CAPILLARY
GLUCOSE-CAPILLARY: 138 mg/dL — AB (ref 70–99)
GLUCOSE-CAPILLARY: 161 mg/dL — AB (ref 70–99)
GLUCOSE-CAPILLARY: 171 mg/dL — AB (ref 70–99)
GLUCOSE-CAPILLARY: 139 mg/dL — AB (ref 70–99)
Glucose-Capillary: 135 mg/dL — ABNORMAL HIGH (ref 70–99)

## 2018-08-06 LAB — CBC
HCT: 37.7 % (ref 36.0–46.0)
HCT: 35.5 % — ABNORMAL LOW (ref 36.0–46.0)
Hemoglobin: 11.9 g/dL — ABNORMAL LOW (ref 12.0–15.0)
Hemoglobin: 11.5 g/dL — ABNORMAL LOW (ref 12.0–15.0)
MCH: 28.4 pg (ref 26.0–34.0)
MCH: 28.9 pg (ref 26.0–34.0)
MCHC: 31.6 g/dL (ref 30.0–36.0)
MCHC: 32.4 g/dL (ref 30.0–36.0)
MCV: 90 fL (ref 78.0–100.0)
MCV: 89.2 fL (ref 78.0–100.0)
PLATELETS: 473 10*3/uL — AB (ref 150–400)
PLATELETS: 458 10*3/uL — AB (ref 150–400)
RBC: 4.19 MIL/uL (ref 3.87–5.11)
RBC: 3.98 MIL/uL (ref 3.87–5.11)
RDW: 16.9 % — AB (ref 11.5–15.5)
RDW: 16.6 % — AB (ref 11.5–15.5)
WBC: 14.3 10*3/uL — AB (ref 4.0–10.5)
WBC: 16.8 10*3/uL — ABNORMAL HIGH (ref 4.0–10.5)

## 2018-08-06 LAB — PROCALCITONIN: Procalcitonin: 0.1 ng/mL

## 2018-08-06 MED ORDER — ISOSORBIDE MONONITRATE ER 30 MG PO TB24
30.0000 mg | ORAL_TABLET | Freq: Every day | ORAL | Status: DC
  Administered 2018-08-06 – 2018-08-07 (×2): 30 mg via ORAL
  Filled 2018-08-06 (×2): qty 1

## 2018-08-06 MED ORDER — FERROUS SULFATE 325 (65 FE) MG PO TABS
325.0000 mg | ORAL_TABLET | Freq: Every day | ORAL | Status: DC
  Administered 2018-08-07: 325 mg via ORAL
  Filled 2018-08-06 (×2): qty 1

## 2018-08-06 MED ORDER — PRO-STAT SUGAR FREE PO LIQD
30.0000 mL | Freq: Three times a day (TID) | ORAL | Status: DC
  Administered 2018-08-06: 30 mL via ORAL
  Filled 2018-08-06 (×3): qty 30

## 2018-08-06 MED ORDER — FUROSEMIDE 40 MG PO TABS
40.0000 mg | ORAL_TABLET | Freq: Every day | ORAL | Status: DC
  Administered 2018-08-06 – 2018-08-07 (×2): 40 mg via ORAL
  Filled 2018-08-06 (×2): qty 1

## 2018-08-06 MED ORDER — SENNA 8.6 MG PO TABS
1.0000 | ORAL_TABLET | Freq: Two times a day (BID) | ORAL | Status: DC
  Administered 2018-08-06 – 2018-08-07 (×2): 8.6 mg via ORAL
  Filled 2018-08-06 (×2): qty 1

## 2018-08-06 MED ORDER — MILK AND MOLASSES ENEMA
1.0000 | Freq: Once | RECTAL | Status: AC
  Administered 2018-08-06: 250 mL via RECTAL
  Filled 2018-08-06: qty 250

## 2018-08-06 MED ORDER — AMLODIPINE BESYLATE 5 MG PO TABS
5.0000 mg | ORAL_TABLET | Freq: Every day | ORAL | Status: DC
  Administered 2018-08-06 – 2018-08-07 (×2): 5 mg via ORAL
  Filled 2018-08-06 (×2): qty 1

## 2018-08-06 MED ORDER — ALBUTEROL SULFATE (2.5 MG/3ML) 0.083% IN NEBU
2.5000 mg | INHALATION_SOLUTION | Freq: Every day | RESPIRATORY_TRACT | Status: DC
  Filled 2018-08-06: qty 3

## 2018-08-06 MED ORDER — OXYCODONE HCL ER 15 MG PO T12A
15.0000 mg | EXTENDED_RELEASE_TABLET | Freq: Two times a day (BID) | ORAL | Status: DC
  Administered 2018-08-06 – 2018-08-07 (×3): 15 mg via ORAL
  Filled 2018-08-06 (×3): qty 1

## 2018-08-06 MED ORDER — VITAMIN D3 25 MCG (1000 UNIT) PO TABS
1000.0000 [IU] | ORAL_TABLET | Freq: Every day | ORAL | Status: DC
  Administered 2018-08-06 – 2018-08-07 (×2): 1000 [IU] via ORAL
  Filled 2018-08-06 (×2): qty 1

## 2018-08-06 MED ORDER — VITAMIN B-12 1000 MCG PO TABS
1000.0000 ug | ORAL_TABLET | Freq: Every day | ORAL | Status: DC
  Administered 2018-08-07: 1000 ug via ORAL
  Filled 2018-08-06 (×2): qty 1

## 2018-08-06 MED ORDER — PANTOPRAZOLE SODIUM 40 MG PO TBEC
40.0000 mg | DELAYED_RELEASE_TABLET | Freq: Every day | ORAL | Status: DC
  Administered 2018-08-07: 40 mg via ORAL
  Filled 2018-08-06: qty 1

## 2018-08-06 MED ORDER — CLOPIDOGREL BISULFATE 75 MG PO TABS
75.0000 mg | ORAL_TABLET | Freq: Every day | ORAL | Status: DC
  Administered 2018-08-06 – 2018-08-07 (×2): 75 mg via ORAL
  Filled 2018-08-06 (×2): qty 1

## 2018-08-06 MED ORDER — METOPROLOL SUCCINATE ER 50 MG PO TB24
50.0000 mg | ORAL_TABLET | Freq: Every day | ORAL | Status: DC
  Administered 2018-08-06 – 2018-08-07 (×2): 50 mg via ORAL
  Filled 2018-08-06 (×2): qty 1

## 2018-08-06 MED ORDER — GABAPENTIN 300 MG PO CAPS
600.0000 mg | ORAL_CAPSULE | Freq: Three times a day (TID) | ORAL | Status: DC
  Administered 2018-08-06 – 2018-08-07 (×3): 600 mg via ORAL
  Filled 2018-08-06 (×3): qty 2

## 2018-08-06 MED ORDER — ASPIRIN EC 81 MG PO TBEC
81.0000 mg | DELAYED_RELEASE_TABLET | Freq: Every day | ORAL | Status: DC
  Administered 2018-08-06 – 2018-08-07 (×2): 81 mg via ORAL
  Filled 2018-08-06 (×2): qty 1

## 2018-08-06 MED ORDER — MELATONIN 5 MG PO TABS
10.0000 mg | ORAL_TABLET | Freq: Every day | ORAL | Status: DC
  Administered 2018-08-06: 10 mg via ORAL
  Filled 2018-08-06: qty 2

## 2018-08-06 MED ORDER — FOLIC ACID 1 MG PO TABS
1.0000 mg | ORAL_TABLET | Freq: Every day | ORAL | Status: DC
  Administered 2018-08-07: 1 mg via ORAL
  Filled 2018-08-06: qty 1

## 2018-08-06 MED ORDER — ESCITALOPRAM OXALATE 20 MG PO TABS
20.0000 mg | ORAL_TABLET | Freq: Every day | ORAL | Status: DC
  Administered 2018-08-06 – 2018-08-07 (×2): 20 mg via ORAL
  Filled 2018-08-06 (×2): qty 1

## 2018-08-06 MED ORDER — MAGNESIUM OXIDE 400 (241.3 MG) MG PO TABS
400.0000 mg | ORAL_TABLET | Freq: Every day | ORAL | Status: DC
  Administered 2018-08-07: 400 mg via ORAL
  Filled 2018-08-06 (×2): qty 1

## 2018-08-06 NOTE — Progress Notes (Signed)
PROGRESS NOTE  Lauren [name] MHD:[id number] DOB: [birth year] DOA: 07/29/2018 PCP: [name], MD  HPI/Recap of past 24 hours:  Lauren [name] is an 67 y.o. female past medical history of hypertension, hyperlipidemia, diabetes mellitus type 2, with a history of CVA, COPD psoriasis ventral hernia status post endometrial cancer comes in complaining of abdominal pain that started around 4 AM the day prior to admission she denies any fever chills diarrhea black stools bright red blood per rectum, CT scan of the abdomen and pelvis shows small bowel obstruction with a transition point lower than the ventral hernia.  08/04/2018: Patient seen and examined at her bedside.  Confusion noted.  Does not appears to be in distress.  General surgery following and recommends conservative management.  08/05/18: Seen and examined with her home sitter at bedside.  She is alert and oriented x3.  NG-tube in place to suction and draining well.  She is on restraints.  08/06/2018: Possible resolution of SBO.  Alert and oriented x3.  Had a bowel movement and is tolerating a clear liquid diet.  Advance diet as tolerated.  Assessment/Plan: Principal Problem:   SBO (small bowel obstruction) (HCC) Active Problems:   Diabetes mellitus without complication (HCC)   Acute lower UTI   Hypernatremia  Small bowel obstruction, resolving Remove NG tube per surgery Abdominal x-ray done this morning revealed resolution of SBO On clear liquid diet Advance diet as tolerated  Accelerated hypertension Resume oral antihypertensive medications Continue to monitor vital signs  Leukocytosis WBC 14 from 12 K  Suspect reactive Afebrile Obtain procalcitonin to rule out bacterial infection Repeat CBC in the morning  UTI Completed treatment with 4 days of IV antibiotics  Ambulatory dysfunction PT recommend home health PT with 24 hours assistance Fall precautions   Code Status: Full code  Family Communication: None at  bedside  Disposition Plan: Home with home health PT possibly tomorrow 08/07/2018 if can tolerate a soft diet.   Consultants:  General surgery  Procedures:  None  Antimicrobials:  None  DVT prophylaxis: Subcu Lovenox daily   Objective: Vitals:   08/05/18 2324 08/06/18 0400 08/06/18 0405 08/06/18 1415  BP: (!) 144/81 (!) 165/61  (!) 179/81  Pulse: 92 82  90  Resp:  18  18  Temp:  98 F (36.7 C)  98.6 F (37 C)  TempSrc:  Oral  Oral  SpO2:  99%  98%  Weight:   89.1 kg   Height:        Intake/Output Summary (Last 24 hours) at 08/06/2018 1558 Last data filed at 08/06/2018 1400 Gross per 24 hour  Intake 2351.81 ml  Output 1825 ml  Net 526.81 ml   Filed Weights   08/04/18 0414 08/05/18 0405 08/06/18 0405  Weight: 90.7 kg 90.4 kg 89.1 kg    Exam:  . General: 67 y.o. year-old female obese in no acute distress.  Alert and oriented x3.   . Cardiovascular: Regular rate and rhythm with no rubs or gallops.  No JVD or thyromegaly noted.   Marland Kitchen Respiratory: Clear to auscultation with no wheezes or rales. Good inspiratory effort. . Abdomen: Soft nontender nondistended with abdominal hernia which is nonreducible.  Normal bowel sounds x4 quadrants. . Musculoskeletal: No lower extremity edema. 2/4 pulses in all 4 extremities. Marland Kitchen Psychiatry: Mood is appropriate for condition and setting   Data Reviewed: CBC: Recent Labs  Lab 08/01/18 0434 08/03/18 0443 08/04/18 0438 08/05/18 0424 08/06/18 0429  WBC 12.6* 14.2* 14.4* 12.4* 14.3*  HGB 10.5* 11.0* 12.6 11.5* 11.9*  HCT 33.8* 35.0* 39.4 36.2 37.7  MCV 92.1 90.4 90.8 90.3 90.0  PLT 381 410* 307 398 109*   Basic Metabolic Panel: Recent Labs  Lab 07/31/18 1211 08/01/18 0434 08/02/18 0441 08/03/18 0443 08/04/18 0438 08/06/18 0429  NA 137 141 136 137 140  --   K 3.9 4.2 4.8 4.9 4.4  --   CL 100 101 102 99 101  --   CO2 28 29 26 28 29   --   GLUCOSE 131* 115* 166* 231* 173*  --   BUN 21 16 10 11 10   --   CREATININE  1.11* 1.17* 0.97 1.04* 1.05* 0.89  CALCIUM 8.6* 8.7* 8.9 9.0 9.5  --    GFR: Estimated Creatinine Clearance: 68.9 mL/min (by C-G formula based on SCr of 0.89 mg/dL). Liver Function Tests: No results for input(s): AST, ALT, ALKPHOS, BILITOT, PROT, ALBUMIN in the last 168 hours. No results for input(s): LIPASE, AMYLASE in the last 168 hours. No results for input(s): AMMONIA in the last 168 hours. Coagulation Profile: No results for input(s): INR, PROTIME in the last 168 hours. Cardiac Enzymes: No results for input(s): CKTOTAL, CKMB, CKMBINDEX, TROPONINI in the last 168 hours. BNP (last 3 results) No results for input(s): PROBNP in the last 8760 hours. HbA1C: No results for input(s): HGBA1C in the last 72 hours. CBG: Recent Labs  Lab 08/05/18 2052 08/05/18 2321 08/06/18 0401 08/06/18 0835 08/06/18 1157  GLUCAP 127* 123* 138* 161* 171*   Lipid Profile: No results for input(s): CHOL, HDL, LDLCALC, TRIG, CHOLHDL, LDLDIRECT in the last 72 hours. Thyroid Function Tests: No results for input(s): TSH, T4TOTAL, FREET4, T3FREE, THYROIDAB in the last 72 hours. Anemia Panel: No results for input(s): VITAMINB12, FOLATE, FERRITIN, TIBC, IRON, RETICCTPCT in the last 72 hours. Urine analysis:    Component Value Date/Time   COLORURINE YELLOW 07/29/2018 2104   APPEARANCEUR CLOUDY (A) 07/29/2018 2104   LABSPEC 1.020 07/29/2018 2104   LABSPEC 1.010 02/12/2009 1035   PHURINE 5.0 07/29/2018 2104   GLUCOSEU NEGATIVE 07/29/2018 2104   HGBUR NEGATIVE 07/29/2018 2104   BILIRUBINUR NEGATIVE 07/29/2018 2104   BILIRUBINUR Negative 02/12/2009 1035   KETONESUR 5 (A) 07/29/2018 2104   PROTEINUR NEGATIVE 07/29/2018 2104   UROBILINOGEN 0.2 08/14/2015 1400   NITRITE NEGATIVE 07/29/2018 2104   LEUKOCYTESUR MODERATE (A) 07/29/2018 2104   LEUKOCYTESUR Large 02/12/2009 1035   Sepsis Labs: @LABRCNTIP (procalcitonin:4,lacticidven:4)  )No results found for this or any previous visit (from the past 240  hour(s)).    Studies: Dg Abd 2 Views  Result Date: 08/06/2018 CLINICAL DATA:  Small-bowel obstruction, abdominal wall hernia, hypertension, diabetes mellitus, COPD, endometrial cancer EXAM: ABDOMEN - 2 VIEW COMPARISON:  08/04/2018 FINDINGS: Tip of nasogastric tube projects over gastric antrum. Lung bases clear. Retained contrast in colon and distal small bowel. No bowel dilatation, bowel wall thickening, or free air. No definite evidence of obstruction on current exam. Bones demineralized. Surgical clips RIGHT upper quadrant and in pelvis. IMPRESSION: Nonobstructive bowel gas pattern. GI contrast seen on 08/04/2018 has progressed distally in bowel to rectum. Electronically Signed   By: Lavonia Dana M.D.   On: 08/06/2018 08:34    Scheduled Meds: . albuterol  2.5 mg Inhalation Daily  . amLODipine  5 mg Oral Daily  . aspirin EC  81 mg Oral Daily  . cholecalciferol  1,000 Units Oral Daily  . clopidogrel  75 mg Oral Daily  . enoxaparin (LOVENOX) injection  40 mg Subcutaneous Q24H  .  escitalopram  20 mg Oral Daily  . feeding supplement (PRO-STAT SUGAR FREE 64)  30 mL Oral TID  . [START ON 08/07/2018] ferrous sulfate  325 mg Oral QAC breakfast  . folic acid  1 mg Oral Daily  . furosemide  40 mg Oral Daily  . gabapentin  600 mg Oral TID  . Gerhardt's butt cream   Topical BID  . insulin aspart  0-9 Units Subcutaneous Q4H  . isosorbide mononitrate  30 mg Oral Daily  . [START ON 08/07/2018] magnesium oxide  400 mg Oral Daily  . Melatonin  10 mg Oral QHS  . metoprolol succinate  50 mg Oral Daily  . mometasone-formoterol  2 puff Inhalation BID  . oxyCODONE  15 mg Oral Q12H  . [START ON 08/07/2018] pantoprazole  40 mg Oral Daily  . senna  1-2 tablet Oral BID  . tapentadol  50 mg Oral Q6H  . [START ON 08/07/2018] vitamin B-12  1,000 mcg Oral Daily    Continuous Infusions: . dextrose 5 % and 0.45 % NaCl with KCl 40 mEq/L 25 mL/hr at 08/06/18 0600  . famotidine (PEPCID) IV 20 mg (08/06/18 1005)      LOS: 7 days     Kayleen Memos, MD [facility] Pager [phone]  If 7PM-7AM, please contact night-coverage [email or web address] Password [email or web address] 08/06/2018, 3:58 PM

## 2018-08-06 NOTE — Progress Notes (Signed)
Physical Therapy Treatment Patient Details Name: Lauren [name] MRN: [id number] DOB: 03/05/1951 Today's Date: 08/06/2018    History of Present Illness 67 y.o. female past medical history of hypertension, hyperlipidemia, diabetes mellitus type 2, CVA, COPD, multiple back surgeries, ventral hernia, status post endometrial cancer comes in complaining of abdominal pain and admitted for SBO secondary to chronically incarcerated large ventral incisional hernia     PT Comments    Pt required a little encouragement however agreeable to up to recliner.  Pt declined attempting ambulation (even just a few steps).    Follow Up Recommendations  Supervision/Assistance - 24 hour;Home health PT(SNF if family/caregiver unable to provide physical assist)     Equipment Recommendations  None recommended by PT    Recommendations for Other Services       Precautions / Restrictions Precautions Precautions: Fall Required Braces or Orthoses: Other Brace/Splint Other Brace/Splint: has AFO in L shoe    Mobility  Bed Mobility Overal bed mobility: Needs Assistance Bed Mobility: Supine to Sit     Supine to sit: HOB elevated;Mod assist     General bed mobility comments: pt able to assist more with LEs today, assist required for scooting to EOB and trunk upright  Transfers Overall transfer level: Needs assistance Equipment used: Rolling walker (2 wheeled) Transfers: Sit to/from [employer] Transfers Sit to Stand: Min assist;+2 safety/equipment Stand pivot transfers: Min assist;+2 safety/equipment       General transfer comment: performed standing once and then pt returned to sitting when ambulation mentioned, pt performed standing again as she was agreeable to up to recliner, cues for RW positioning and small steps  Ambulation/Gait                 Stairs             Wheelchair Mobility    Modified Rankin (Stroke Patients Only)       Balance Overall balance  assessment: Needs assistance Sitting-balance support: No upper extremity supported;Feet supported Sitting balance-Leahy Scale: Fair     Standing balance support: Bilateral upper extremity supported Standing balance-Leahy Scale: Poor                              Cognition Arousal/Alertness: Awake/alert Behavior During Therapy: WFL for tasks assessed/performed Overall Cognitive Status: Within Functional Limits for tasks assessed                                 General Comments: family present, pt not pulling at lines today      Exercises      General Comments        Pertinent Vitals/Pain Pain Assessment: Faces Faces Pain Scale: Hurts little more Pain Location: bil feet with donning shoes Pain Descriptors / Indicators: Tender;Discomfort;Grimacing Pain Intervention(s): Limited activity within patient's tolerance;Monitored during session;Repositioned    Home Living                      Prior Function            PT Goals (current goals can now be found in the care plan section) Progress towards PT goals: Progressing toward goals    Frequency    Min 3X/week      PT Plan Current plan remains appropriate    Co-evaluation              AM-PAC  PT "6 Clicks" Daily Activity  Outcome Measure  Difficulty turning over in bed (including adjusting bedclothes, sheets and blankets)?: Unable Difficulty moving from lying on back to sitting on the side of the bed? : Unable Difficulty sitting down on and standing up from a chair with arms (e.g., wheelchair, bedside commode, etc,.)?: Unable Help needed moving to and from a bed to chair (including a wheelchair)?: A Little Help needed walking in hospital room?: A Lot Help needed climbing 3-5 steps with a railing? : Total 6 Click Score: 9    End of Session Equipment Utilized During Treatment: Gait belt Activity Tolerance: Patient tolerated treatment well Patient left: with call bell/phone  within reach;in chair;with chair alarm set;with family/visitor present;Other (comment)(telesitter)   PT Visit Diagnosis: Difficulty in walking, not elsewhere classified (R26.2);Muscle weakness (generalized) (M62.81)     Time: 7048-8891 PT Time Calculation (min) (ACUTE ONLY): 27 min  Charges:  $Therapeutic Activity: 23-37 mins                     Carmelia Bake, PT, DPT 08/06/2018 Pager: [phone]  York Ram E 08/06/2018, 3:41 PM

## 2018-08-06 NOTE — Progress Notes (Signed)
Patient was taken off restraints this morning, patient is tolerating well and not pulling at NG tube. Tele sitter is still in patients room monitor patient while tube is still in place. Patient was advanced to clear liquids and is tolerating well. Patient has had multiple bowel movements and complains of no pain. If NG tube can be taken out later this afternoon, I will discontinue the [facility]. Family at bedside. Will continue to monitor patient.   Carmela Hurt, RN

## 2018-08-06 NOTE — Progress Notes (Signed)
Patient ID: Lauren [name], female   DOB: May 07, 1951, 67 y.o.   MRN: [id number]       Subjective: Pt feels better today.  + flatus, no BM.  NGT with very watered down appearing output.  No abdominal pain.  Objective: Vital signs in last 24 hours: Temp:  [98 F (36.7 C)-98.7 F (37.1 C)] 98 F (36.7 C) (08/09 0400) Pulse Rate:  [82-93] 82 (08/09 0400) Resp:  [18-20] 18 (08/09 0400) BP: (144-167)/(61-89) 165/61 (08/09 0400) SpO2:  [97 %-99 %] 99 % (08/09 0400) Weight:  [89.1 kg] 89.1 kg (08/09 0405) Last BM Date: 08/03/18  Intake/Output from previous day: 08/08 0701 - 08/09 0700 In: 1726.4 [I.V.:622.1; IV Piggyback:1104.2] Out: 2800 [Urine:550; Emesis/NG output:2250] Intake/Output this shift: No intake/output data recorded.  PE: Heart: regular Lungs: CTAB Abd: soft, NT, +BS, ND, hernia present and soft.  Lab Results:  Recent Labs    08/05/18 0424 08/06/18 0429  WBC 12.4* 14.3*  HGB 11.5* 11.9*  HCT 36.2 37.7  PLT 398 473*   BMET Recent Labs    08/04/18 0438 08/06/18 0429  NA 140  --   K 4.4  --   CL 101  --   CO2 29  --   GLUCOSE 173*  --   BUN 10  --   CREATININE 1.05* 0.89  CALCIUM 9.5  --    PT/INR No results for input(s): LABPROT, INR in the last 72 hours. CMP     Component Value Date/Time   NA 140 08/04/2018 0438   NA 142 01/28/2018 1035   K 4.4 08/04/2018 0438   CL 101 08/04/2018 0438   CO2 29 08/04/2018 0438   GLUCOSE 173 (H) 08/04/2018 0438   BUN 10 08/04/2018 0438   BUN 19 01/28/2018 1035   BUN 14.7 07/18/2014 1142   CREATININE 0.89 08/06/2018 0429   CREATININE 1.0 07/18/2014 1142   CALCIUM 9.5 08/04/2018 0438   PROT 7.3 07/30/2018 0610   PROT 6.5 03/10/2017 1656   ALBUMIN 3.6 07/30/2018 0610   AST 21 07/30/2018 0610   ALT 22 07/30/2018 0610   ALKPHOS 56 07/30/2018 0610   BILITOT 0.6 07/30/2018 0610   GFRNONAA >60 08/06/2018 0429   GFRAA >60 08/06/2018 0429   Lipase     Component Value Date/Time   LIPASE 23 07/29/2018 2104        Studies/Results: Dg Abd Portable 1v  Result Date: 08/04/2018 CLINICAL DATA:  67 year old female with a history of nasogastric tube placement EXAM: PORTABLE ABDOMEN - 1 VIEW COMPARISON:  08/03/2018 FINDINGS: Limited plain film of the abdomen demonstrates gastric tube terminating in the stomach. Side port is beyond the GE junction. Surgical changes of right cholecystectomy. Surgical changes of the mid lower abdomen. Retained enteric contrast, similar to the prior. No significantly distended small bowel or colon. IMPRESSION: Gastric tube terminates within the stomach. Retained enteric contrast. Electronically Signed   By: Corrie Mckusick D.O.   On: 08/04/2018 14:58    Anti-infectives: Anti-infectives (From admission, onward)   Start     Dose/Rate Route Frequency Ordered Stop   07/30/18 2200  cefTRIAXone (ROCEPHIN) 1 g in sodium chloride 0.9 % 100 mL IVPB  Status:  Discontinued     1 g 200 mL/hr over 30 Minutes Intravenous Every 24 hours 07/30/18 0253 08/03/18 1205   07/30/18 0200  cefTRIAXone (ROCEPHIN) 1 g in sodium chloride 0.9 % 100 mL IVPB     1 g 200 mL/hr over 30 Minutes Intravenous  Once 07/30/18  0151 07/30/18 0322       Assessment/Plan H/O uterine ca, s/p hysterectomy CVA Obesity HTN DM COPD CAD  SBO secondary to chronically incarcerated large ventral incisional hernia -patient states once again she is agreeable to get enema yesterday, but never given.  This has been reordered for today. -await films today.  If these show improvement, then will pull NGT and allow clear liquids -DC restraints once NGT out  ID -rocephin 8/2, currently none FEN -IVF, NPO, NG tube VTE -SCDs, lovenox Foley -in place  Plan-await films today, enema today  LOS: 7 days    Henreitta Cea , [facility] 08/06/2018, 7:54 AM Pager: [phone]

## 2018-08-06 NOTE — Progress Notes (Signed)
Nutrition Follow-up  DOCUMENTATION CODES:   Obesity unspecified  INTERVENTION:   30 ml Prostat TID, each supplement provides 100 kcals and 15 grams protein.   Monitor magnesium, potassium, and phosphorus daily for at least 3 days, MD to replete as needed, as pt is at risk for refeeding syndrome given lack of PO intake for 6 days.  NUTRITION DIAGNOSIS:   Increased nutrient needs related to wound healing as evidenced by estimated needs.  Ongoing  GOAL:   Patient will meet greater than or equal to 90% of their needs  Not meeting on CLD  MONITOR:   Diet advancement, Weight trends, Labs, I & O's, Skin  REASON FOR ASSESSMENT:   NPO/Clear Liquid Diet, Other (Comment)(PI report)    ASSESSMENT:   Patient with PMH significant for HTN, HLD, DM, CVA, COPD, and ventral hernia, s/p endometrial cancer. Presents this admission with complaints of abdominal pain and diarrhea. CT scan of the abdomen and pelvis shows small bowel obstruction with a transition point within the ventral hernia.    8/7- NGT placed to suction 8/9- NGT clamped, clear liquid diet  NGT output: 2250 ml x 24 hrs  Most current xray showed nonobstructive bowel gas pattern with contrast and distal colon. Pt had sips of soda and broth this morning, denies any nausea/vomiting. She had a bowel movement this am. NGT could possibly be pulled out today is she continues to improve. Discussed the importance of protein intake while on a clear liquid diet. Pt dislikes Boost but is willing to try Prostat. RD to order.   Weight noted to decrease 4 lb since last RD visit on 8/6. Will continue to monitor trends.   Medications reviewed and include: D5 with 40 mEq KCl @ 25 ml/hr, pepcid Labs reviewed.   Diet Order:   Diet Order            Diet clear liquid Room service appropriate? Yes; Fluid consistency: Thin  Diet effective now              EDUCATION NEEDS:   Education needs have been addressed  Skin:  Skin  Assessment: Skin Integrity Issues: Skin Integrity Issues:: Diabetic Ulcer Diabetic Ulcer: right heel  Last BM:  08/06/18  Height:   Ht Readings from Last 1 Encounters:  07/30/18 5\' 6"  (1.676 m)    Weight:   Wt Readings from Last 1 Encounters:  08/06/18 89.1 kg    Ideal Body Weight:  59.1 kg  BMI:  Body mass index is 31.7 kg/m.  Estimated Nutritional Needs:   Kcal:  1550-1750 kcal  Protein:  75-85 grams   Fluid:  >/= 1.5 L/day    Mariana Single RD, LDN Clinical Nutrition Pager # - [phone]

## 2018-08-06 NOTE — Progress Notes (Signed)
All 4 side rails up per pt request for safety. Will continue to monitor patient. Carmela Hurt, RN

## 2018-08-07 LAB — CBC
HEMATOCRIT: 36.8 % (ref 36.0–46.0)
HEMOGLOBIN: 11.5 g/dL — AB (ref 12.0–15.0)
MCH: 28 pg (ref 26.0–34.0)
MCHC: 31.3 g/dL (ref 30.0–36.0)
MCV: 89.5 fL (ref 78.0–100.0)
PLATELETS: 494 10*3/uL — AB (ref 150–400)
RBC: 4.11 MIL/uL (ref 3.87–5.11)
RDW: 16.9 % — ABNORMAL HIGH (ref 11.5–15.5)
WBC: 13 10*3/uL — ABNORMAL HIGH (ref 4.0–10.5)

## 2018-08-07 LAB — GLUCOSE, CAPILLARY
Glucose-Capillary: 123 mg/dL — ABNORMAL HIGH (ref 70–99)
Glucose-Capillary: 135 mg/dL — ABNORMAL HIGH (ref 70–99)
Glucose-Capillary: 135 mg/dL — ABNORMAL HIGH (ref 70–99)
Glucose-Capillary: 149 mg/dL — ABNORMAL HIGH (ref 70–99)

## 2018-08-07 LAB — PROCALCITONIN: PROCALCITONIN: 0.11 ng/mL

## 2018-08-07 MED ORDER — POLYETHYLENE GLYCOL 3350 17 G PO PACK
17.0000 g | PACK | Freq: Every day | ORAL | Status: DC
  Administered 2018-08-07: 17 g via ORAL
  Filled 2018-08-07: qty 1

## 2018-08-07 MED ORDER — PRO-STAT SUGAR FREE PO LIQD: 30.0000 mL | Freq: Three times a day (TID) | ORAL | 0 refills | Status: DC

## 2018-08-07 NOTE — Progress Notes (Signed)
Patient ID: Lauren [name], female   DOB: 10/12/1951, 67 y.o.   MRN: [id number]     Subjective: Feels well this morning.  Tolerating diet without nausea or pain.  Has had flatus and bowel movement.  Feels she is back to her baseline.  Objective: Vital signs in last 24 hours: Temp:  [98.4 F (36.9 C)-98.6 F (37 C)] 98.5 F (36.9 C) (08/10 0400) Pulse Rate:  [84-90] 86 (08/10 0400) Resp:  [12-18] 12 (08/10 0400) BP: (145-179)/(62-81) 148/64 (08/10 0400) SpO2:  [96 %-98 %] 96 % (08/10 0400) Weight:  [88.1 kg] 88.1 kg (08/10 0516) Last BM Date: 08/06/18  Intake/Output from previous day: 08/09 0701 - 08/10 0700 In: 1747.7 [P.O.:1120; I.V.:518.9; IV Piggyback:108.7] Out: 975 [Urine:975] Intake/Output this shift: No intake/output data recorded.  General appearance: alert, cooperative and no distress GI: Large lower abdominal ventral hernia.  Soft and nontender.  Lab Results:  Recent Labs    08/06/18 1617 08/07/18 0427  WBC 16.8* 13.0*  HGB 11.5* 11.5*  HCT 35.5* 36.8  PLT 458* 494*   BMET Recent Labs    08/06/18 0429 08/06/18 1617  NA  --  138  K  --  4.1  CL  --  102  CO2  --  26  GLUCOSE  --  161*  BUN  --  12  CREATININE 0.89 0.94  CALCIUM  --  8.9     Studies/Results: Dg Abd 2 Views  Result Date: 08/06/2018 CLINICAL DATA:  Small-bowel obstruction, abdominal wall hernia, hypertension, diabetes mellitus, COPD, endometrial cancer EXAM: ABDOMEN - 2 VIEW COMPARISON:  08/04/2018 FINDINGS: Tip of nasogastric tube projects over gastric antrum. Lung bases clear. Retained contrast in colon and distal small bowel. No bowel dilatation, bowel wall thickening, or free air. No definite evidence of obstruction on current exam. Bones demineralized. Surgical clips RIGHT upper quadrant and in pelvis. IMPRESSION: Nonobstructive bowel gas pattern. GI contrast seen on 08/04/2018 has progressed distally in bowel to rectum. Electronically Signed   By: Lavonia Dana M.D.   On: 08/06/2018  08:34    Anti-infectives: Anti-infectives (From admission, onward)   Start     Dose/Rate Route Frequency Ordered Stop   07/30/18 2200  cefTRIAXone (ROCEPHIN) 1 g in sodium chloride 0.9 % 100 mL IVPB  Status:  Discontinued     1 g 200 mL/hr over 30 Minutes Intravenous Every 24 hours 07/30/18 0253 08/03/18 1205   07/30/18 0200  cefTRIAXone (ROCEPHIN) 1 g in sodium chloride 0.9 % 100 mL IVPB     1 g 200 mL/hr over 30 Minutes Intravenous  Once 07/30/18 0151 07/30/18 0322      Assessment/Plan: Episode of small bowel obstruction related to large chronic ventral incisional hernia.  This appears resolved. Okay for discharge from our point of view.  We will sign off. Patient has been evaluated in [place] for repair which was declined due to smoking but she has quit smoking for several years and I recommended that she contact Dr. Roxanne Mins again in [place] to consider repair.    LOS: 8 days    Edward Jolly 08/07/2018

## 2018-08-07 NOTE — Discharge Summary (Signed)
Discharge Summary  Lauren [name] YTK:[id number] DOB: 1951-03-25  PCP: [name], MD  Admit date: 07/29/2018 Discharge date: 08/07/2018  Time spent: 25 minutes  Recommendations for Outpatient Follow-up:  1. Follow-up with Dr. Macy Mis in [place] for possible abdominal hernia repair. 2. Follow-up with your PCP 3. Continue PT at home 4. Fall precautions 5. Take your medications as prescribed   Discharge Diagnoses:  Active Hospital Problems   Diagnosis Date Noted  . SBO (small bowel obstruction) ([place]) 07/30/2018  . Acute lower UTI 07/30/2018  . Hypernatremia 07/30/2018  . Diabetes mellitus without complication ([place]) 55/73/2202    Resolved Hospital Problems  No resolved problems to display.    Discharge Condition: Stable  Diet recommendation: Resume previous diet  Vitals:   08/06/18 2103 08/07/18 0400  BP: (!) 145/62 (!) 148/64  Pulse: 84 86  Resp: 16 12  Temp: 98.4 F (36.9 C) 98.5 F (36.9 C)  SpO2: 96% 96%    History of present illness:  Lauren [name] an 67 y.o.femalepast medical history of hypertension, hyperlipidemia, diabetes mellitus type 2, with a history of CVA, COPD psoriasis ventral hernia status post endometrial cancer comes in complaining of abdominal pain that started around 4 AM the day prior to admission she denies any fever chills diarrhea black stools bright red blood per rectum, CT scan of the abdomen and pelvis shows small bowel obstruction with a transition point lower than the ventral hernia.  Hospital course complicated by intermittent confusions with pulling at NG tube which resolved on 08/05/2018.  On 08/06/2018 abdominal x-ray revealed resolution of small bowel obstruction.  Patient had bowel movements and was tolerating a diet which was advanced.  08/07/2018: Patient seen and examined at her bedside.  She has no new complaints.  She denies nausea, cramps, or abdominal pain on a regular consistency diet.  On the day of discharge  the patient was hemodynamically stable.  She will need to follow-up with her general surgeon for possible abdominal hernia repair and also follow-up with her PCP post hospitalization.   Hospital Course:  Principal Problem:   SBO (small bowel obstruction) (H[facility]) Active Problems:   Diabetes mellitus without complication ([place])   Acute lower UTI   Hypernatremia  Small bowel obstruction,  resolved NG tube removed Had bowel movements Well tolerated regular consistency diet Abdominal x-ray done on 08/06/2018 revealed resolution of SBO  Accelerated hypertension, resolved Continue oral antihypertensive medications Follow-up with your PCP outpatient  Leukocytosis suspect reactive WBC 13 K from 14 K Afebrile with procalcitonin 0.11 No sign of active infective process  UTI Completed treatment with 4 days of IV antibiotics  Ambulatory dysfunction PT recommend home health PT with 24 hours assistance Fall precautions   Procedures:  None  Consultations:  General surgery  Discharge Exam: BP (!) 148/64 (BP Location: Left Arm)   Pulse 86   Temp 98.5 F (36.9 C) (Oral)   Resp 12   Ht 5\' 6"  (1.676 m)   Wt 88.1 kg   SpO2 96%   BMI 31.35 kg/m  . General: 67 y.o. year-old female well developed well nourished in no acute distress.  Alert and oriented x3. . Cardiovascular: Regular rate and rhythm with no rubs or gallops.  No thyromegaly or JVD noted.   Marland Kitchen Respiratory: Clear to auscultation with no wheezes or rales. Good inspiratory effort. . Abdomen: Soft nontender nondistended with normal bowel sounds x4 quadrants. . Musculoskeletal: No lower extremity edema. 2/4 pulses in all 4 extremities. . Skin: No ulcerative  lesions noted or rashes, . Psychiatry: Mood is appropriate for condition and setting  Discharge Instructions You were cared for by a hospitalist during your hospital stay. If you have any questions about your discharge medications or the care you received while you  were in the hospital after you are discharged, you can call the unit and asked to speak with the hospitalist on call if the hospitalist that took care of you is not available. Once you are discharged, your primary care physician will handle any further medical issues. Please note that NO REFILLS for any discharge medications will be authorized once you are discharged, as it is imperative that you return to your primary care physician (or establish a relationship with a primary care physician if you do not have one) for your aftercare needs so that they can reassess your need for medications and monitor your lab values.   Allergies as of 08/07/2018      Reactions   Ambien [zolpidem Tartrate] Other (See Comments)   Per the pt, "it makes me go crazy"   Sulfa Antibiotics Rash   Ativan [lorazepam] Other (See Comments)   Patient has the opposite effect when taking medication    Elavil [amitriptyline Hcl] Other (See Comments)   Gastritis   Prozac [fluoxetine Hcl] Anxiety, Other (See Comments)   irritability      Medication List    STOP taking these medications   nortriptyline 10 MG capsule Commonly known as:  PAMELOR     TAKE these medications   ACCU-CHEK AVIVA PLUS test strip Generic drug:  glucose blood 1 each by Other route as needed. Use as instructed   amLODipine 5 MG tablet Commonly known as:  NORVASC Take 1 tablet (5 mg total) by mouth daily.   aspirin EC 81 MG tablet Take 81 mg by mouth daily.   clopidogrel 75 MG tablet Commonly known as:  PLAVIX Take 1 tablet (75 mg total) by mouth daily.   escitalopram 20 MG tablet Commonly known as:  LEXAPRO Take 20 mg by mouth daily.   esomeprazole 40 MG capsule Commonly known as:  NEXIUM Take 40 mg by mouth daily.   feeding supplement (PRO-STAT SUGAR FREE 64) Liqd Take 30 mLs by mouth 3 (three) times daily.   ferrous sulfate 325 (65 FE) MG tablet Take 325 mg by mouth daily.   Fluticasone-Salmeterol 250-50 MCG/DOSE  Aepb Commonly known as:  ADVAIR Inhale 1 puff into the lungs 2 (two) times daily as needed (shortness of breath).   folic acid 1 MG tablet Commonly known as:  FOLVITE Take 1 mg by mouth daily.   furosemide 40 MG tablet Commonly known as:  LASIX Take 40 mg by mouth daily.   gabapentin 600 MG tablet Commonly known as:  NEURONTIN Take 600 mg by mouth 3 (three) times daily.   insulin glargine 100 UNIT/ML injection Commonly known as:  LANTUS Inject 70 Units into the skin daily.   isosorbide mononitrate 30 MG 24 hr tablet Commonly known as:  IMDUR Take 1 tablet (30 mg total) by mouth daily.   losartan 50 MG tablet Commonly known as:  COZAAR Take 50 mg by mouth daily.   magnesium oxide 400 MG tablet Commonly known as:  MAG-OX Take 400 mg by mouth daily.   Melatonin 10 MG Tabs Take 10 mg by mouth at bedtime.   metaxalone 800 MG tablet Commonly known as:  SKELAXIN Take 800 mg by mouth 3 (three) times daily. 6am, noon, 6pm   methotrexate  2.5 MG tablet 5-7.5 tablets. Take 7.5mg  by mouth in the morning and 5mg  in the evening once weekly on Wednesday   metoprolol succinate 50 MG 24 hr tablet Commonly known as:  TOPROL-XL Take 50 mg by mouth daily.   multivitamin with minerals Tabs tablet Take 1 tablet by mouth daily.   nitroGLYCERIN 0.4 MG SL tablet Commonly known as:  NITROSTAT Place 1 tablet (0.4 mg total) under the tongue every 5 (five) minutes as needed for chest pain.   ondansetron 4 MG disintegrating tablet Commonly known as:  ZOFRAN-ODT Take 4 mg by mouth every 8 (eight) hours as needed for nausea or vomiting.   OXYCONTIN 15 mg 12 hr tablet Generic drug:  oxyCODONE Take 1 tablet (15 mg total) by mouth every 12 (twelve) hours.   potassium chloride 10 MEQ CR capsule Commonly known as:  MICRO-K Take 10 mEq by mouth 2 (two) times daily.   PROAIR RESPICLICK 092 (90 Base) MCG/ACT Aepb Generic drug:  Albuterol Sulfate Inhale 2 puffs into the lungs daily.    promethazine 25 MG tablet Commonly known as:  PHENERGAN Take 12.5-25 mg by mouth every 6 (six) hours as needed for nausea.   RESTA SILVER Gel Apply 1 application topically daily.   rosuvastatin 5 MG tablet Commonly known as:  CRESTOR Take 5 mg by mouth at bedtime.   senna 8.6 MG tablet Commonly known as:  SENOKOT Take 1-2 tablets by mouth daily. 1 tablet in the morning and 2 tablets at night   tapentadol 50 MG tablet Commonly known as:  NUCYNTA Take 1 tablet (50 mg total) by mouth every 6 (six) hours. 6 am, noon, 6pm  And midnight   vitamin B-12 1000 MCG tablet Commonly known as:  CYANOCOBALAMIN Take 1,000 mcg by mouth daily.   Vitamin D3 1000 units Caps Take 1,000 Units by mouth daily.      Allergies  Allergen Reactions  . Ambien [Zolpidem Tartrate] Other (See Comments)    Per the pt, "it makes me go crazy"  . Sulfa Antibiotics Rash  . Ativan [Lorazepam] Other (See Comments)    Patient has the opposite effect when taking medication   . Elavil [Amitriptyline Hcl] Other (See Comments)    Gastritis  . Prozac [Fluoxetine Hcl] Anxiety and Other (See Comments)    irritability    Follow-up Information    [name], MD. Call in 1 day(s).   Specialty:  Family Medicine Why:  Please call for an appointment. Contact information: [place] [address] [phone]        Heniford, Esperanza Sheets, MD. Call in 1 day(s).   Specialty:  Surgery Why:  Please call for an appointment.           The results of significant diagnostics from this hospitalization (including imaging, microbiology, ancillary and laboratory) are listed below for reference.    Significant Diagnostic Studies: Dg Abd 1 View  Result Date: 08/03/2018 CLINICAL DATA:  Encounter for nasogastric tube placement EXAM: ABDOMEN - 1 VIEW COMPARISON:  08/02/2018 FINDINGS: Nasogastric tube tip projects at the distal stomach. Enteric contrast which at least partially outlines the colon.  IMPRESSION: Nasogastric tube with tip over the distal stomach. Electronically Signed   By: Monte Fantasia M.D.   On: 08/03/2018 18:15   Dg Abd 1 View  Result Date: 08/02/2018 CLINICAL DATA:  67 year old female with a history of bowel obstruction EXAM: ABDOMEN - 1 VIEW COMPARISON:  Multiple prior plain film, with most recent 08/01/2018. Prior CT  07/30/2018 FINDINGS: Limited plain film abdomen. There has been removal of the gastric tube. Gas within stomach. Multiple dilated small bowel loops within the mid abdomen, right abdomen, left abdomen. Enteric contrast present within the hepatic flexure. Decompressed left colon, with small enteric contrast at the splenic flexure. Distal colon relatively decompressed. Surgical changes of the lumbar region. IMPRESSION: Plain film evidence of partial small bowel obstruction. Gastric tube is been removed. Electronically Signed   By: Corrie Mckusick D.O.   On: 08/02/2018 11:10   Dg Abd 1 View  Result Date: 08/01/2018 CLINICAL DATA:  Abdominal pain and nausea. Small bowel obstruction. Ventral hernia. Endometrial carcinoma. EXAM: ABDOMEN - 1 VIEW COMPARISON:  07/30/2018 FINDINGS: Nasogastric tube remains in place with tip overlying the gastric antrum. Multiple dilated small bowel loops have increased since previous study. Oral contrast material from previous CT is now seen within mildly dilated small bowel loops in right lower quadrant ventral hernia. A small amount of contrast is seen within the ascending and transverse colon which is nondilated. IMPRESSION: Partial small bowel obstruction, with increased small bowel dilatation since previous study. Oral contrast now seen in dilated small bowel loops within right abdominal ventral hernia. Electronically Signed   By: Earle Gell M.D.   On: 08/01/2018 14:13   Dg Abd 1 View  Result Date: 07/30/2018 CLINICAL DATA:  67 year old female status post NG tube placement. EXAM: ABDOMEN - 1 VIEW COMPARISON:  0948 hours today. FINDINGS:  Portable AP view at 1243 hours. The enteric tube has been advanced and the side hole now projects at the level of the distal gastric body. The tip is at the level of the antrum. Mild gas distended stomach. Negative lung bases. Stable cholecystectomy clips. Excreted IV contrast in nondilated right renal collecting system. Possible small volume of retained oral contrast in the lower abdominal small bowel or sigmoid colon. IMPRESSION: Enteric tube tip now at the gastric antrum, side hole at the distal body. Electronically Signed   By: Genevie Ann M.D.   On: 07/30/2018 13:51   Dg Abdomen 1 View  Result Date: 07/30/2018 CLINICAL DATA:  Nasogastric tube placement. EXAM: ABDOMEN - 1 VIEW COMPARISON:  CT abdomen and pelvis July 30, 2018 a FINDINGS: Nasogastric tube past mid stomach, distal tip out of field of view. Included bowel gas pattern is nondilated and nonobstructive. Contrast in the RIGHT collecting system, LEFT kidney demonstrated normal contrast excretion on today's CT. Surgical clips in the included right abdomen compatible with cholecystectomy. Surgical clips mid abdomen. IMPRESSION: 1. Nasogastric tube past mid stomach, distal tip out of field of view. Electronically Signed   By: Elon Alas M.D.   On: 07/30/2018 03:36   Ct Abdomen Pelvis W Contrast  Result Date: 07/30/2018 CLINICAL DATA:  Nausea, vomiting, abdominal pain. Hernia, rule out incarceration. EXAM: CT ABDOMEN AND PELVIS WITH CONTRAST TECHNIQUE: Multidetector CT imaging of the abdomen and pelvis was performed using the standard protocol following bolus administration of intravenous contrast. CONTRAST:  170mL ISOVUE-300 IOPAMIDOL (ISOVUE-300) INJECTION 61% COMPARISON:  CT 08/21/2017 FINDINGS: Lower chest: Lung bases are clear. Hepatobiliary: No focal liver abnormality is seen. Status post cholecystectomy. No biliary dilatation. Pancreas: Parenchymal atrophy. No ductal dilatation or inflammation. Spleen: Normal in size without focal  abnormality. Adrenals/Urinary Tract: No adrenal nodule. Decreased right hydronephrosis from prior exam with mild residual prominence of the renal pelvis. Resolved left hydronephrosis since prior exam. Cortical scarring throughout the left kidney. No perinephric edema. Both ureters are decompressed. Urinary bladder is decompressed by Foley  catheter. Stomach/Bowel: Distended stomach and small bowel with transition point within a small bowel loop within the large lower ventral abdominal wall hernia. Transition point appreciated on image 30 series 6. This is not at the hernia neck, and is likely due to adhesion within the hernia sac. Hernia contains transverse colon and small bowel. No evidence pneumatosis or perforation. There is adjacent mesenteric edema and free fluid. More distal small bowel is decompressed. Small volume of stool throughout the colon without colonic wall thickening or inflammatory change. Normal appendix tentatively identified. Transverse colon enters large lower anterior abdominal wall hernia. Vascular/Lymphatic: Diffuse aorto bi-iliac atherosclerosis. No aneurysm. No acute vascular finding. No adenopathy. Reproductive: Status post hysterectomy. No adnexal masses. Other: Large ventral abdominal wall hernia in the midline with associated rectus diastasis. Fluid within the hernia sac with associated mesenteric edema. Trace free fluid in the pelvis. No free air. Musculoskeletal: Postsurgical and degenerative change in the lower lumbar spine. Grade 2 anterolisthesis of L4 on L5, with fragmented appearance of the L5 vertebra and lumbosacral junction, stable in appearance from prior. No acute osseous abnormality. IMPRESSION: 1. Small bowel obstruction, with transition point within the large lower ventral abdominal wall hernia. Transition occurs within the mesentery located in the hernia sac rather than the hernia neck suggesting adhesions as source of obstruction. Mesenteric edema and free fluid but no  perforation. 2. Resolved left hydronephrosis and improved right hydronephrosis from CT 1 year ago. 3.  Aortic Atherosclerosis (ICD10-I70.0). Electronically Signed   By: Jeb Levering M.D.   On: 07/30/2018 01:10   Dg Fluoro Rm 1-60 Min  Result Date: 08/03/2018 CLINICAL DATA:  67 year old female with nasogastric tube removed. Initial encounter. EXAM: FLOURO RM 1-60 MIN CONTRAST:  None. FLUOROSCOPY TIME:  Fluoroscopy Time:  3 seconds Radiation Exposure Index: 0.4 mGy COMPARISON:  August 02, 2018. FINDINGS: After 3 attempts of placing 42 French nasogastric tube, patient grabbed tube and removed the tube. Patient was not able to proceed with placement of tube. Gas-filled stomach noted on fluoroscopy. IMPRESSION: Patient not able to cooperate with placement of nasogastric tube. Electronically Signed   By: Genia Del M.D.   On: 08/03/2018 09:42   Dg Abd 2 Views  Result Date: 08/06/2018 CLINICAL DATA:  Small-bowel obstruction, abdominal wall hernia, hypertension, diabetes mellitus, COPD, endometrial cancer EXAM: ABDOMEN - 2 VIEW COMPARISON:  08/04/2018 FINDINGS: Tip of nasogastric tube projects over gastric antrum. Lung bases clear. Retained contrast in colon and distal small bowel. No bowel dilatation, bowel wall thickening, or free air. No definite evidence of obstruction on current exam. Bones demineralized. Surgical clips RIGHT upper quadrant and in pelvis. IMPRESSION: Nonobstructive bowel gas pattern. GI contrast seen on 08/04/2018 has progressed distally in bowel to rectum. Electronically Signed   By: Lavonia Dana M.D.   On: 08/06/2018 08:34   Dg Abd Portable 1v  Result Date: 08/04/2018 CLINICAL DATA:  67 year old female with a history of nasogastric tube placement EXAM: PORTABLE ABDOMEN - 1 VIEW COMPARISON:  08/03/2018 FINDINGS: Limited plain film of the abdomen demonstrates gastric tube terminating in the stomach. Side port is beyond the GE junction. Surgical changes of right cholecystectomy. Surgical  changes of the mid lower abdomen. Retained enteric contrast, similar to the prior. No significantly distended small bowel or colon. IMPRESSION: Gastric tube terminates within the stomach. Retained enteric contrast. Electronically Signed   By: Corrie Mckusick D.O.   On: 08/04/2018 14:58   Dg Abd Portable 1v-small Bowel Obstruction Protocol-initial, 8 Hr Delay  Result Date: 07/30/2018  CLINICAL DATA:  Small bowel obstruction, 8 hour delayed image. EXAM: PORTABLE ABDOMEN - 1 VIEW COMPARISON:  Radiographs and CT earlier this day. FINDINGS: Portable AP view of the lower abdomen demonstrates no enteric contrast in the colon. Probable enteric contrast in the small bowel in the central and right mid abdomen. Enteric tube tip and side-port in the stomach. Patient refused additional imaging of the upper abdomen. IMPRESSION: No enteric contrast visualized within the colon. Recommend 24 hour delayed film. Electronically Signed   By: Jeb Levering M.D.   On: 07/30/2018 22:45   Dg Abd Portable 1v-small Bowel Protocol-position Verification  Result Date: 07/30/2018 CLINICAL DATA:  Check nasogastric catheter placement EXAM: PORTABLE ABDOMEN - 1 VIEW COMPARISON:  07/30/2018 FINDINGS: Nasogastric catheter is again identified been withdrawn somewhat. The proximal side port lies in the distal esophagus. This should be advanced several cm. IMPRESSION: Nasogastric catheter as described. This should be advanced several cm. Electronically Signed   By: Inez Catalina M.D.   On: 07/30/2018 10:15   Mm 3d Screen Breast Bilateral  Result Date: 07/14/2018 CLINICAL DATA:  Screening. EXAM: DIGITAL SCREENING BILATERAL MAMMOGRAM WITH TOMO AND CAD COMPARISON:  Previous exam(s). ACR Breast Density Category a: The breast tissue is almost entirely fatty. FINDINGS: There are no findings suspicious for malignancy. Images were processed with CAD. IMPRESSION: No mammographic evidence of malignancy. A result letter of this screening mammogram will be  mailed directly to the patient. RECOMMENDATION: Screening mammogram in one year. (Code:SM-B-01Y) BI-RADS CATEGORY  1: Negative. Electronically Signed   By: Claudie Revering M.D.   On: 07/14/2018 08:50    Microbiology: No results found for this or any previous visit (from the past 240 hour(s)).   Labs: Basic Metabolic Panel: Recent Labs  Lab 08/01/18 0434 08/02/18 0441 08/03/18 0443 08/04/18 0438 08/06/18 0429 08/06/18 1617  NA 141 136 137 140  --  138  K 4.2 4.8 4.9 4.4  --  4.1  CL 101 102 99 101  --  102  CO2 29 26 28 29   --  26  GLUCOSE 115* 166* 231* 173*  --  161*  BUN 16 10 11 10   --  12  CREATININE 1.17* 0.97 1.04* 1.05* 0.89 0.94  CALCIUM 8.7* 8.9 9.0 9.5  --  8.9   Liver Function Tests: No results for input(s): AST, ALT, ALKPHOS, BILITOT, PROT, ALBUMIN in the last 168 hours. No results for input(s): LIPASE, AMYLASE in the last 168 hours. No results for input(s): AMMONIA in the last 168 hours. CBC: Recent Labs  Lab 08/04/18 0438 08/05/18 0424 08/06/18 0429 08/06/18 1617 08/07/18 0427  WBC 14.4* 12.4* 14.3* 16.8* 13.0*  HGB 12.6 11.5* 11.9* 11.5* 11.5*  HCT 39.4 36.2 37.7 35.5* 36.8  MCV 90.8 90.3 90.0 89.2 89.5  PLT 307 398 473* 458* 494*   Cardiac Enzymes: No results for input(s): CKTOTAL, CKMB, CKMBINDEX, TROPONINI in the last 168 hours. BNP: BNP (last 3 results) Recent Labs    08/21/17 2015 01/28/18 1035  BNP 125.4* 87.1    ProBNP (last 3 results) No results for input(s): PROBNP in the last 8760 hours.  CBG: Recent Labs  Lab 08/06/18 2100 08/07/18 0007 08/07/18 0358 08/07/18 0743 08/07/18 1117  GLUCAP 135* 123* 135* 149* 135*       Signed:  Kayleen Memos, MD [facility] 08/07/2018, 12:55 PM

## 2018-08-07 NOTE — Discharge Instructions (Signed)
Abdominal Pain, Adult Many things can cause belly (abdominal) pain. Most times, belly pain is not dangerous. Many cases of belly pain can be watched and treated at home. Sometimes belly pain is serious, though. Your doctor will try to find the cause of your belly pain. Follow these instructions at home:  Take over-the-counter and prescription medicines only as told by your doctor. Do not take medicines that help you poop (laxatives) unless told to by your doctor.  Drink enough fluid to keep your pee (urine) clear or pale yellow.  Watch your belly pain for any changes.  Keep all follow-up visits as told by your doctor. This is important. Contact a doctor if:  Your belly pain changes or gets worse.  You are not hungry, or you lose weight without trying.  You are having trouble pooping (constipated) or have watery poop (diarrhea) for more than 2-3 days.  You have pain when you pee or poop.  Your belly pain wakes you up at night.  Your pain gets worse with meals, after eating, or with certain foods.  You are throwing up and cannot keep anything down.  You have a fever. Get help right away if:  Your pain does not go away as soon as your doctor says it should.  You cannot stop throwing up.  Your pain is only in areas of your belly, such as the right side or the left lower part of the belly.  You have bloody or black poop, or poop that looks like tar.  You have very bad pain, cramping, or bloating in your belly.  You have signs of not having enough fluid or water in your body (dehydration), such as: ? Dark pee, very little pee, or no pee. ? Cracked lips. ? Dry mouth. ? Sunken eyes. ? Sleepiness. ? Weakness. This information is not intended to replace advice given to you by your health care provider. Make sure you discuss any questions you have with your health care provider. Document Released: 06/02/2008 Document Revised: 07/04/2016 Document Reviewed: 05/28/2016 Elsevier  Interactive Patient Education  2018 [place] A hernia happens when an organ or tissue inside your body pushes out through a weak spot in the belly (abdomen). Follow these instructions at home:  Avoid stretching or overusing (straining) the muscles near the hernia.  Do not lift anything heavier than 10 lb (4.5 kg).  Use the muscles in your leg when you lift something up. Do not use the muscles in your back.  When you cough, try to cough gently.  Eat a diet that has a lot of fiber. Eat lots of fruits and vegetables.  Drink enough fluids to keep your pee (urine) clear or pale yellow. Try to drink 6-8 glasses of water a day.  Take medicines to make your poop soft (stool softeners) as told by your doctor.  Lose weight, if you are overweight.  Do not use any tobacco products, including cigarettes, chewing tobacco, or electronic cigarettes. If you need help quitting, ask your doctor.  Keep all follow-up visits as told by your doctor. This is important. Contact a doctor if:  The skin by the hernia gets puffy (swollen) or red.  The hernia is painful. Get help right away if:  You have a fever.  You have belly pain that is getting worse.  You feel sick to your stomach (nauseous) or you throw up (vomit).  You cannot push the hernia back in place by gently pressing on it while  you are lying down.  The hernia: ? Changes in shape or size. ? Is stuck outside your belly. ? Changes color. ? Feels hard or tender. This information is not intended to replace advice given to you by your health care provider. Make sure you discuss any questions you have with your health care provider. Document Released: 06/04/2010 Document Revised: 05/22/2016 Document Reviewed: 10/25/2014 Elsevier Interactive Patient Education  2018 [place] Bowel Obstruction A small bowel obstruction means that something is blocking the small bowel. The small bowel is also called the small  intestine. It is the long tube that connects the stomach to the colon. An obstruction will stop food and fluids from passing through the small bowel. Treatment depends on what is causing the problem and how bad the problem is. Follow these instructions at home:  Get a lot of rest.  Follow your diet as told by your doctor. You may need to: ? Only drink clear liquids until you start to get better. ? Avoid solid foods as told by your doctor.  Take over-the-counter and prescription medicines only as told by your doctor.  Keep all follow-up visits as told by your doctor. This is important. Contact a doctor if:  You have a fever.  You have chills. Get help right away if:  You have pain or cramps that get worse.  You throw up (vomit) blood.  You have a feeling of being sick to your stomach (nausea) that does not go away.  You cannot stop throwing up.  You cannot drink fluids.  You feel confused.  You feel dry or thirsty (dehydrated).  Your belly gets more bloated.  You feel weak or you pass out (faint). This information is not intended to replace advice given to you by your health care provider. Make sure you discuss any questions you have with your health care provider. Document Released: 01/22/2005 Document Revised: 08/11/2016 Document Reviewed: 02/08/2015 Elsevier Interactive Patient Education  [employer].

## 2018-08-07 NOTE — Care Management Note (Addendum)
Case Management Note  Patient Details  Name: RESHONDA KOERBER MRN: [id number] Date of Birth: [birth year]  Subjective/Objective:  SBO r/t large chronic ventral incisional hernia                 Action/Plan: NCM spoke to pt and states she is active with [facility] RN, Occupational Therapy and Physical Therapy. Pt states her wound is management at the specialist office. She has RW, wheelchair, and cane at home. Husband at home to assist with care. She pays out of pocket for a Caregiver that comes each day from 8-1pm.   Expected Discharge Date:  08/07/18               Expected Discharge Plan:  [place]  In-House Referral:  Clinical Social Work  Discharge planning Services  CM Consult  Post Acute Care Choice:  Home Health, Resumption of Svcs/PTA Provider Choice offered to:  Patient  DME Arranged:  N/A DME Agency:  NA  HH Arranged:  PT, RN [place] Agency:  [facility]  Status of Service:  Completed, signed off  If discussed at [place] [facility] of Stay Meetings, dates discussed:    Additional Comments:  Erenest Rasher, RN 08/07/2018, 1:35 PM

## 2018-10-29 ENCOUNTER — Other Ambulatory Visit: Payer: Self-pay | Admitting: Vascular Surgery

## 2018-11-23 ENCOUNTER — Ambulatory Visit: Payer: Medicare Other | Admitting: Cardiovascular Disease

## 2018-12-17 ENCOUNTER — Other Ambulatory Visit: Payer: Self-pay | Admitting: Cardiovascular Disease

## 2018-12-31 ENCOUNTER — Encounter: Payer: Self-pay | Admitting: Vascular Surgery

## 2018-12-31 ENCOUNTER — Ambulatory Visit (INDEPENDENT_AMBULATORY_CARE_PROVIDER_SITE_OTHER): Payer: Medicare Other | Admitting: Vascular Surgery

## 2018-12-31 ENCOUNTER — Other Ambulatory Visit: Payer: Self-pay

## 2018-12-31 VITALS — BP 119/62 | HR 61 | Temp 97.3°F | Resp 20 | Ht 66.0 in | Wt 199.0 lb

## 2018-12-31 DIAGNOSIS — I739 Peripheral vascular disease, unspecified: Secondary | ICD-10-CM | POA: Diagnosis not present

## 2018-12-31 NOTE — Progress Notes (Signed)
Patient ID: Lauren [name], female   DOB: 02-21-1951, 68 y.o.   MRN: [id number]  Reason for Consult: PAD (3-4wks f/u with ABI, LE Art, S/p Aortogram bilat LE runoff, Stent L SFA and Pop arteries. )   Referred by [name], MD  Subjective:     HPI:  Lauren [name] is a 68 y.o. female history of bilateral lower extremity foot drop worse on the left.  She underwent stenting of her left SFA and popliteal artery back in July of last year.  Studies were performed after that.  She subsequently developed a bowel obstruction was hospitalized developed C. difficile and has not followed up since.  She does not have any wounds on her legs.  She walks with the help of a walker and a foot drop shoe brace.  She not have any tissue loss or ulceration.  Currently feeling fine with stable leg pain.  No studies before today's visit.  Past Medical History:  Diagnosis Date  . Adenomatous colon polyp   . Anemia   . Anxiety   . AVM (arteriovenous malformation)    cecum  . CAD (coronary artery disease)   . Carotid artery occlusion   . Cataract    bil cateracts removed  . COPD (chronic obstructive pulmonary disease) ([place])   . Cough   . DDD (degenerative disc disease), lumbar   . Depression   . Diabetes mellitus   . Elevated LFTs   . Endometrial ca [facility])    endometrial ca dx 4/11  . GERD (gastroesophageal reflux disease)   . Gout 2015  . Hernia of flank   . Hydronephrosis   . Hyperlipidemia   . Hypertension   . Insomnia   . OAB (overactive bladder)   . Osteoarthritis   . Peripheral neuropathy    following chemotherapy  . Psoriasis   . Pulmonary nodule    since 2016  . Radiation 05/01/2011-06/11/11   5600 cGy 28 fxs/periaortic  . Stroke ([place])    tia  . Thyroid nodule   . TIA (transient ischemic attack) 2015  . 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 Uterine cancer ([place])   . Ventral hernia    postoperative since 2009   Family History  Problem  Relation Age of Onset  . Stroke Mother   . Irritable bowel syndrome Mother   . Heart attack Father 66       died  . Heart disease Father        Heart Disease before age 80, rheumatic heart disease  . Hyperlipidemia Father   . Hypertension Father   . Diabetes Daughter   . Hypertension Daughter   . Irritable bowel syndrome Daughter   . Colon cancer Neg Hx   . Esophageal cancer Neg Hx   . Pancreatic cancer Neg Hx   . Rectal cancer Neg Hx   . Stomach cancer Neg Hx   . Neuropathy Neg Hx    Past Surgical History:  Procedure Laterality Date  . ABDOMINAL AORTOGRAM W/LOWER EXTREMITY Right 06/28/2018   Procedure: ABDOMINAL AORTOGRAM W/LOWER EXTREMITY;  Surgeon: [name], MD;  Location: [facility];  Service: Cardiovascular;  Laterality: Right;  . ABDOMINAL HYSTERECTOMY  2009   for ca  . BACK SURGERY  Aug/sept 2014   3 back surgeries  (Aug 2014 & Sept 2014)  . BREAST BIOPSY Left over 10 years ago   benign  . CHOLECYSTECTOMY  biliteral  . COLONOSCOPY  11/13/2014   negative except for a small cecal AVM and 1 polyp  . ENDARTERECTOMY Right 08/17/2015   Procedure: Right Carotid Endartarectomy with patch Angioplasty ;  Surgeon: Mal Misty, MD;  Location: [place];  Service: Vascular;  Laterality: Right;  . EYE SURGERY Right Aug. 2013   Cataract, lens implant  . EYE SURGERY Left Sept. 2013   Cataract, lens implant  . I & D of lumbar fusion /removal of hardware for chronic infection  Jan. 8, 2015  . right carotid endarterectomy  2016  . SPINE SURGERY      Short Social History:  Social History   Tobacco Use  . Smoking status: Former Smoker    Packs/day: 0.10    Years: 40.00    Pack years: 4.00    Types: Cigarettes    Last attempt to quit: 01/2017    Years since quitting: 1.9  . Smokeless tobacco: Never Used  Substance Use Topics  . Alcohol use: No    Alcohol/week: 0.0 standard drinks    Allergies  Allergen Reactions  . Ambien [Zolpidem Tartrate]  Other (See Comments)    Per the pt, "it makes me go crazy"  . Sulfa Antibiotics Rash  . Ativan [Lorazepam] Other (See Comments)    Patient has the opposite effect when taking medication   . Elavil [Amitriptyline Hcl] Other (See Comments)    Gastritis  . Prozac [Fluoxetine Hcl] Anxiety and Other (See Comments)    irritability     Current Outpatient Medications  Medication Sig Dispense Refill  . Albuterol Sulfate (PROAIR RESPICLICK) 809 (90 Base) MCG/ACT AEPB Inhale 2 puffs into the lungs daily.     . Amino Acids-Protein Hydrolys (FEEDING SUPPLEMENT, PRO-STAT SUGAR FREE 64,) LIQD Take 30 mLs by mouth 3 (three) times daily. 900 mL 0  . amLODipine (NORVASC) 5 MG tablet Take 1 tablet (5 mg total) by mouth daily. 30 tablet 0  . aspirin EC 81 MG tablet Take 81 mg by mouth daily.    . Cholecalciferol (VITAMIN D3) 1000 UNITS CAPS Take 1,000 Units by mouth daily.     . clopidogrel (PLAVIX) 75 MG tablet TAKE 1 TABLET BY MOUTH ONCE DAILY 30 tablet 3  . escitalopram (LEXAPRO) 20 MG tablet Take 20 mg by mouth daily.    Marland Kitchen esomeprazole (NEXIUM) 40 MG capsule Take 40 mg by mouth daily.     . ferrous sulfate 325 (65 FE) MG tablet Take 325 mg by mouth daily.     . Fluticasone-Salmeterol (ADVAIR) 250-50 MCG/DOSE AEPB Inhale 1 puff into the lungs 2 (two) times daily as needed (shortness of breath).     . folic acid (FOLVITE) 1 MG tablet Take 1 mg by mouth daily.    . furosemide (LASIX) 40 MG tablet Take 40 mg by mouth daily.     Marland Kitchen gabapentin (NEURONTIN) 600 MG tablet Take 600 mg by mouth 3 (three) times daily.    Marland Kitchen glucose blood (ACCU-CHEK AVIVA PLUS) test strip 1 each by Other route as needed. Use as instructed     . insulin glargine (LANTUS) 100 UNIT/ML injection Inject 70 Units into the skin daily.     . isosorbide mononitrate (IMDUR) 30 MG 24 hr tablet Take 1 tablet (30 mg total) by mouth daily. 90 tablet 2  . losartan (COZAAR) 50 MG tablet Take 50 mg by mouth daily.     . magnesium oxide (MAG-OX) 400  MG tablet Take 400 mg by mouth daily.    Marland Kitchen  Melatonin 10 MG TABS Take 10 mg by mouth at bedtime.    . metaxalone (SKELAXIN) 800 MG tablet Take 800 mg by mouth 3 (three) times daily. 6am, noon, 6pm    . methotrexate 2.5 MG tablet 5-7.5 tablets. Take 7.5mg  by mouth in the morning and 5mg  in the evening once weekly on Wednesday  3  . metoprolol succinate (TOPROL-XL) 50 MG 24 hr tablet Take 50 mg by mouth daily.    . Multiple Vitamin (MULTIVITAMIN WITH MINERALS) TABS tablet Take 1 tablet by mouth daily.    . nitroGLYCERIN (NITROSTAT) 0.4 MG SL tablet Place 1 tablet (0.4 mg total) under the tongue every 5 (five) minutes as needed for chest pain. 25 tablet 3  . ondansetron (ZOFRAN-ODT) 4 MG disintegrating tablet Take 4 mg by mouth every 8 (eight) hours as needed for nausea or vomiting.     . OXYCONTIN 15 MG 12 hr tablet Take 1 tablet (15 mg total) by mouth every 12 (twelve) hours. 4 tablet 0  . potassium chloride (MICRO-K) 10 MEQ CR capsule Take 10 mEq by mouth 2 (two) times daily.   3  . potassium chloride SA (K-DUR,KLOR-CON) 20 MEQ tablet TAKE 1 TABLET BY MOUTH TWICE DAILY 180 tablet 0  . promethazine (PHENERGAN) 25 MG tablet Take 12.5-25 mg by mouth every 6 (six) hours as needed for nausea.     Dwyane Dee SILVER GEL Apply 1 application topically daily.    . rosuvastatin (CRESTOR) 5 MG tablet Take 5 mg by mouth at bedtime.     . senna (SENOKOT) 8.6 MG tablet Take 1-2 tablets by mouth daily. 1 tablet in the morning and 2 tablets at night    . tapentadol (NUCYNTA) 50 MG tablet Take 1 tablet (50 mg total) by mouth every 6 (six) hours. 6 am, noon, 6pm  And midnight 8 tablet 0  . vitamin B-12 (CYANOCOBALAMIN) 1000 MCG tablet Take 1,000 mcg by mouth daily.     No current facility-administered medications for this visit.     Review of Systems  Constitutional:  Constitutional negative. HENT: HENT negative.  Eyes: Eyes negative.  Respiratory: Respiratory negative.  Cardiovascular: Cardiovascular negative.    GI: Gastrointestinal negative.  Musculoskeletal: Positive for gait problem.       Bilateral foot drop Skin: Skin negative.  Hematologic: Hematologic/lymphatic negative.  Psychiatric: Psychiatric negative.        Objective:  Objective   Vitals:   12/31/18 1501  BP: 119/62  Pulse: 61  Resp: 20  Temp: (!) 97.3 F (36.3 C)  TempSrc: Oral  SpO2: 97%  Weight: 199 lb (90.3 kg)  Height: 5\' 6"  (1.676 m)   Body mass index is 32.12 kg/m.  Physical Exam HENT:     Head: Normocephalic.  Cardiovascular:     Rate and Rhythm: Normal rate.     Comments: Strong left peroneal signal Pulmonary:     Effort: Pulmonary effort is normal.  Abdominal:     General: Abdomen is flat.  Musculoskeletal:        General: No swelling.     Comments: Left foot drop  Skin:    Capillary Refill: Capillary refill takes less than 2 seconds.  Neurological:     Mental Status: She is alert.  Psychiatric:        Mood and Affect: Mood normal.        Behavior: Behavior normal.        Thought Content: Thought content normal.  Judgment: Judgment normal.     Data: No studies today     Assessment/Plan:     68 year old female with multiple previous surgical issues and has undergone left SFA and popliteal artery stenting in an effort to heal corrective foot drop surgery which actually did not happen.  There are new new studies today to evaluate her previous stenting she does have a strong peroneal signal which was her dominant runoff although I cannot feel a palpable popliteal pulse.  We will follow her up in 6 months with left lower extremity duplex of her stents as well as ABIs.  If she is to have the surgery before this I would want to get the studies prior.  She demonstrates good understanding.  If she does not need any other surgery she does not have any symptoms and so I would not pursue any intervention otherwise.    [name] MD [facility]

## 2019-01-25 ENCOUNTER — Encounter (INDEPENDENT_AMBULATORY_CARE_PROVIDER_SITE_OTHER): Payer: Self-pay | Admitting: Orthopedic Surgery

## 2019-01-25 ENCOUNTER — Ambulatory Visit (INDEPENDENT_AMBULATORY_CARE_PROVIDER_SITE_OTHER): Payer: Medicare Other | Admitting: Orthopedic Surgery

## 2019-01-25 VITALS — Ht 66.0 in | Wt 199.0 lb

## 2019-01-25 DIAGNOSIS — E1151 Type 2 diabetes mellitus with diabetic peripheral angiopathy without gangrene: Secondary | ICD-10-CM | POA: Diagnosis not present

## 2019-01-25 DIAGNOSIS — M545 Low back pain, unspecified: Secondary | ICD-10-CM

## 2019-01-25 DIAGNOSIS — E1351 Other specified diabetes mellitus with diabetic peripheral angiopathy without gangrene: Secondary | ICD-10-CM

## 2019-01-27 ENCOUNTER — Encounter (HOSPITAL_COMMUNITY): Payer: Self-pay | Admitting: *Deleted

## 2019-01-27 ENCOUNTER — Other Ambulatory Visit: Payer: Self-pay

## 2019-01-27 ENCOUNTER — Ambulatory Visit (INDEPENDENT_AMBULATORY_CARE_PROVIDER_SITE_OTHER): Payer: Self-pay | Admitting: Physician Assistant

## 2019-01-27 NOTE — Progress Notes (Signed)
Mrs Clonch denies chest pain, does get short of breath with some activity, has COPD. PCP is Dr. Sandi Mariscal.  Patient has type II diabetes, she reports that last A1C was 7.6; fasting CBGs 120-150. I instructed patient to check CBG after awaking and every 2 hours until arrival  to the hospital.  I Instructed patient if CBG is less than 70 to take 4 Glucose Tablets or 1 tube of Glucose Gel or 1/2 cup of a clear juice. Recheck CBG in 15 minutes then call pre- op desk at [phone] for further instructions. If CBG > 70 take 1/2 of Lantus dose- 40 units.

## 2019-01-27 NOTE — Anesthesia Preprocedure Evaluation (Addendum)
Anesthesia Evaluation  Patient identified by MRN, date of birth, ID band Patient awake    Reviewed: Allergy & Precautions, H&P , NPO status , Patient's Chart, lab work & pertinent test results, reviewed documented beta blocker date and time   Airway Mallampati: II  TM Distance: >3 FB Neck ROM: Full    Dental no notable dental hx. (+) Teeth Intact, Dental Advisory Given   Pulmonary COPD,  COPD inhaler, former smoker,    Pulmonary exam normal breath sounds clear to auscultation       Cardiovascular Exercise Tolerance: Good hypertension, + CAD, + Peripheral Vascular Disease and +CHF   Rhythm:Regular Rate:Normal     Neuro/Psych Anxiety Depression negative neurological ROS     GI/Hepatic Neg liver ROS, GERD  Medicated and Controlled,  Endo/Other  diabetes, Insulin Dependent, Oral Hypoglycemic Agents  Renal/GU negative Renal ROS  negative genitourinary   Musculoskeletal  (+) Arthritis , Osteoarthritis,    Abdominal   Peds  Hematology  (+) Blood dyscrasia, anemia ,   Anesthesia Other Findings   Reproductive/Obstetrics negative OB ROS                            Anesthesia Physical Anesthesia Plan  ASA: III  Anesthesia Plan: General   Post-op Pain Management:    Induction: Intravenous  PONV Risk Score and Plan: 3 and Ondansetron, Midazolam and Treatment may vary due to age or medical condition  Airway Management Planned: Oral ETT and LMA  Additional Equipment:   Intra-op Plan:   Post-operative Plan: Extubation in OR  Informed Consent: I have reviewed the patients History and Physical, chart, labs and discussed the procedure including the risks, benefits and alternatives for the proposed anesthesia with the patient or authorized representative who has indicated his/her understanding and acceptance.     Dental advisory given  Plan Discussed with: CRNA  Anesthesia Plan Comments:         Anesthesia Quick Evaluation

## 2019-01-28 ENCOUNTER — Ambulatory Visit (HOSPITAL_COMMUNITY): Payer: Medicare Other | Admitting: Anesthesiology

## 2019-01-28 ENCOUNTER — Encounter (HOSPITAL_COMMUNITY)
Admission: RE | Disposition: A | Discharge status: Home / Self Care | Payer: Self-pay | Source: Home / Self Care | Attending: Orthopedic Surgery

## 2019-01-28 ENCOUNTER — Ambulatory Visit (HOSPITAL_COMMUNITY)
Admission: RE | Admit: 2019-01-28 | Discharge: 2019-01-28 | Disposition: A | Discharge status: Home / Self Care | Payer: Medicare Other | Attending: Orthopedic Surgery | Admitting: Orthopedic Surgery

## 2019-01-28 DIAGNOSIS — N3281 Overactive bladder: Secondary | ICD-10-CM | POA: Insufficient documentation

## 2019-01-28 DIAGNOSIS — Z86010 Personal history of colonic polyps: Secondary | ICD-10-CM | POA: Diagnosis not present

## 2019-01-28 DIAGNOSIS — J449 Chronic obstructive pulmonary disease, unspecified: Secondary | ICD-10-CM | POA: Diagnosis not present

## 2019-01-28 DIAGNOSIS — G4700 Insomnia, unspecified: Secondary | ICD-10-CM | POA: Insufficient documentation

## 2019-01-28 DIAGNOSIS — Z79899 Other long term (current) drug therapy: Secondary | ICD-10-CM | POA: Diagnosis not present

## 2019-01-28 DIAGNOSIS — Z7951 Long term (current) use of inhaled steroids: Secondary | ICD-10-CM | POA: Insufficient documentation

## 2019-01-28 DIAGNOSIS — M6701 Short Achilles tendon (acquired), right ankle: Secondary | ICD-10-CM

## 2019-01-28 DIAGNOSIS — E785 Hyperlipidemia, unspecified: Secondary | ICD-10-CM | POA: Insufficient documentation

## 2019-01-28 DIAGNOSIS — Z87891 Personal history of nicotine dependence: Secondary | ICD-10-CM | POA: Insufficient documentation

## 2019-01-28 DIAGNOSIS — M21371 Foot drop, right foot: Secondary | ICD-10-CM | POA: Insufficient documentation

## 2019-01-28 DIAGNOSIS — Z79891 Long term (current) use of opiate analgesic: Secondary | ICD-10-CM | POA: Insufficient documentation

## 2019-01-28 DIAGNOSIS — Z8249 Family history of ischemic heart disease and other diseases of the circulatory system: Secondary | ICD-10-CM | POA: Insufficient documentation

## 2019-01-28 DIAGNOSIS — E1140 Type 2 diabetes mellitus with diabetic neuropathy, unspecified: Secondary | ICD-10-CM | POA: Insufficient documentation

## 2019-01-28 DIAGNOSIS — F329 Major depressive disorder, single episode, unspecified: Secondary | ICD-10-CM | POA: Insufficient documentation

## 2019-01-28 DIAGNOSIS — Z7902 Long term (current) use of antithrombotics/antiplatelets: Secondary | ICD-10-CM | POA: Insufficient documentation

## 2019-01-28 DIAGNOSIS — Z794 Long term (current) use of insulin: Secondary | ICD-10-CM | POA: Insufficient documentation

## 2019-01-28 DIAGNOSIS — K219 Gastro-esophageal reflux disease without esophagitis: Secondary | ICD-10-CM | POA: Diagnosis not present

## 2019-01-28 DIAGNOSIS — I2510 Atherosclerotic heart disease of native coronary artery without angina pectoris: Secondary | ICD-10-CM | POA: Diagnosis not present

## 2019-01-28 DIAGNOSIS — F419 Anxiety disorder, unspecified: Secondary | ICD-10-CM | POA: Diagnosis not present

## 2019-01-28 DIAGNOSIS — Z8542 Personal history of malignant neoplasm of other parts of uterus: Secondary | ICD-10-CM | POA: Insufficient documentation

## 2019-01-28 DIAGNOSIS — Z8673 Personal history of transient ischemic attack (TIA), and cerebral infarction without residual deficits: Secondary | ICD-10-CM | POA: Diagnosis not present

## 2019-01-28 DIAGNOSIS — E1151 Type 2 diabetes mellitus with diabetic peripheral angiopathy without gangrene: Secondary | ICD-10-CM | POA: Diagnosis not present

## 2019-01-28 DIAGNOSIS — I110 Hypertensive heart disease with heart failure: Secondary | ICD-10-CM | POA: Diagnosis not present

## 2019-01-28 DIAGNOSIS — D649 Anemia, unspecified: Secondary | ICD-10-CM | POA: Insufficient documentation

## 2019-01-28 DIAGNOSIS — M5136 Other intervertebral disc degeneration, lumbar region: Secondary | ICD-10-CM | POA: Diagnosis not present

## 2019-01-28 DIAGNOSIS — Z882 Allergy status to sulfonamides status: Secondary | ICD-10-CM | POA: Insufficient documentation

## 2019-01-28 DIAGNOSIS — Z7982 Long term (current) use of aspirin: Secondary | ICD-10-CM | POA: Insufficient documentation

## 2019-01-28 DIAGNOSIS — I509 Heart failure, unspecified: Secondary | ICD-10-CM | POA: Diagnosis not present

## 2019-01-28 DIAGNOSIS — Z888 Allergy status to other drugs, medicaments and biological substances status: Secondary | ICD-10-CM | POA: Insufficient documentation

## 2019-01-28 HISTORY — DX: Heart failure, unspecified: I50.9

## 2019-01-28 HISTORY — DX: Pneumonia, unspecified organism: J18.9

## 2019-01-28 HISTORY — DX: Peripheral vascular disease, unspecified: I73.9

## 2019-01-28 HISTORY — PX: ACHILLES TENDON LENGTHENING: SHX6455

## 2019-01-28 HISTORY — DX: Polyneuropathy, unspecified: G62.9

## 2019-01-28 HISTORY — DX: Foot drop, unspecified foot: M21.379

## 2019-01-28 LAB — CBC
HCT: 37.9 % (ref 36.0–46.0)
Hemoglobin: 11.2 g/dL — ABNORMAL LOW (ref 12.0–15.0)
MCH: 27.8 pg (ref 26.0–34.0)
MCHC: 29.6 g/dL — ABNORMAL LOW (ref 30.0–36.0)
MCV: 94 fL (ref 80.0–100.0)
NRBC: 0 % (ref 0.0–0.2)
Platelets: 340 10*3/uL (ref 150–400)
RBC: 4.03 MIL/uL (ref 3.87–5.11)
RDW: 16.3 % — ABNORMAL HIGH (ref 11.5–15.5)
WBC: 9.8 10*3/uL (ref 4.0–10.5)

## 2019-01-28 LAB — HEMOGLOBIN A1C
Hgb A1c MFr Bld: 8.4 % — ABNORMAL HIGH (ref 4.8–5.6)
Mean Plasma Glucose: 194.38 mg/dL

## 2019-01-28 LAB — BASIC METABOLIC PANEL
Anion gap: 11 (ref 5–15)
BUN: 18 mg/dL (ref 8–23)
CO2: 26 mmol/L (ref 22–32)
Calcium: 9 mg/dL (ref 8.9–10.3)
Chloride: 101 mmol/L (ref 98–111)
Creatinine, Ser: 1.03 mg/dL — ABNORMAL HIGH (ref 0.44–1.00)
GFR calc non Af Amer: 56 mL/min — ABNORMAL LOW (ref >60)
Glucose, Bld: 219 mg/dL — ABNORMAL HIGH (ref 70–99)
POTASSIUM: 4.1 mmol/L (ref 3.5–5.1)
Sodium: 138 mmol/L (ref 135–145)

## 2019-01-28 LAB — GLUCOSE, CAPILLARY
Glucose-Capillary: 212 mg/dL — ABNORMAL HIGH (ref 70–99)
Glucose-Capillary: 198 mg/dL — ABNORMAL HIGH (ref 70–99)

## 2019-01-28 SURGERY — LENGTHENING, TENDON, ACHILLES
Anesthesia: General | Laterality: Right

## 2019-01-28 MED ORDER — VANCOMYCIN HCL IN DEXTROSE 1-5 GM/200ML-% IV SOLN
1000.0000 mg | Freq: Once | INTRAVENOUS | Status: AC
  Administered 2019-01-28: 1000 mg via INTRAVENOUS
  Filled 2019-01-28: qty 200

## 2019-01-28 MED ORDER — MIDAZOLAM HCL 2 MG/2ML IJ SOLN
INTRAMUSCULAR | Status: AC
  Filled 2019-01-28: qty 2

## 2019-01-28 MED ORDER — CHLORHEXIDINE GLUCONATE 4 % EX LIQD: 60.0000 mL | Freq: Once | CUTANEOUS | Status: DC

## 2019-01-28 MED ORDER — CEFAZOLIN SODIUM-DEXTROSE 2-4 GM/100ML-% IV SOLN
2.0000 g | INTRAVENOUS | Status: AC
  Administered 2019-01-28: 2 g via INTRAVENOUS
  Filled 2019-01-28: qty 100

## 2019-01-28 MED ORDER — PROPOFOL 10 MG/ML IV BOLUS
INTRAVENOUS | Status: AC
  Filled 2019-01-28: qty 20

## 2019-01-28 MED ORDER — HYDROMORPHONE HCL 1 MG/ML IJ SOLN: 0.2500 mg | INTRAMUSCULAR | Status: DC | PRN

## 2019-01-28 MED ORDER — SODIUM CHLORIDE 0.9 % IR SOLN: Status: DC | PRN

## 2019-01-28 MED ORDER — LIDOCAINE 2% (20 MG/ML) 5 ML SYRINGE
INTRAMUSCULAR | Status: AC
  Filled 2019-01-28: qty 10

## 2019-01-28 MED ORDER — LACTATED RINGERS IV SOLN
INTRAVENOUS | Status: DC
  Administered 2019-01-28 (×2): via INTRAVENOUS

## 2019-01-28 MED ORDER — EPHEDRINE SULFATE-NACL 50-0.9 MG/10ML-% IV SOSY
PREFILLED_SYRINGE | INTRAVENOUS | Status: DC | PRN
  Administered 2019-01-28: 10 mg via INTRAVENOUS

## 2019-01-28 MED ORDER — ONDANSETRON HCL 4 MG/2ML IJ SOLN
INTRAMUSCULAR | Status: DC | PRN
  Administered 2019-01-28: 4 mg via INTRAVENOUS

## 2019-01-28 MED ORDER — ACETAMINOPHEN 500 MG PO TABS
1000.0000 mg | ORAL_TABLET | Freq: Once | ORAL | Status: AC
  Administered 2019-01-28: 1000 mg via ORAL
  Filled 2019-01-28: qty 2

## 2019-01-28 MED ORDER — FENTANYL CITRATE (PF) 100 MCG/2ML IJ SOLN
INTRAMUSCULAR | Status: DC | PRN
  Administered 2019-01-28 (×2): 50 ug via INTRAVENOUS

## 2019-01-28 MED ORDER — PROPOFOL 10 MG/ML IV BOLUS
INTRAVENOUS | Status: DC | PRN
  Administered 2019-01-28: 150 mg via INTRAVENOUS

## 2019-01-28 MED ORDER — DEXAMETHASONE SODIUM PHOSPHATE 10 MG/ML IJ SOLN
INTRAMUSCULAR | Status: DC | PRN
  Administered 2019-01-28: 5 mg via INTRAVENOUS

## 2019-01-28 MED ORDER — FENTANYL CITRATE (PF) 250 MCG/5ML IJ SOLN
INTRAMUSCULAR | Status: AC
  Filled 2019-01-28: qty 5

## 2019-01-28 MED ORDER — OXYCODONE-ACETAMINOPHEN 5-325 MG PO TABS: 1.0000 | ORAL_TABLET | ORAL | 0 refills | Status: DC | PRN

## 2019-01-28 MED ORDER — LIDOCAINE 2% (20 MG/ML) 5 ML SYRINGE
INTRAMUSCULAR | Status: DC | PRN
  Administered 2019-01-28: 60 mg via INTRAVENOUS

## 2019-01-28 MED ORDER — 0.9 % SODIUM CHLORIDE (POUR BTL) OPTIME
TOPICAL | Status: DC | PRN
  Administered 2019-01-28: 1000 mL

## 2019-01-28 SURGICAL SUPPLY — 34 items
BLADE MINI RND TIP GREEN BEAV (BLADE) ×2 IMPLANT
BLADE SURG 21 STRL SS (BLADE) ×3 IMPLANT
BNDG COHESIVE 6X5 TAN STRL LF (GAUZE/BANDAGES/DRESSINGS) IMPLANT
BNDG GAUZE ELAST 4 BULKY (GAUZE/BANDAGES/DRESSINGS) ×6 IMPLANT
COVER SURGICAL LIGHT HANDLE (MISCELLANEOUS) ×6 IMPLANT
COVER WAND RF STERILE (DRAPES) ×3 IMPLANT
DRAPE U-SHAPE 47X51 STRL (DRAPES) ×3 IMPLANT
DRSG ADAPTIC 3X8 NADH LF (GAUZE/BANDAGES/DRESSINGS) ×3 IMPLANT
DURAPREP 26ML APPLICATOR (WOUND CARE) ×3 IMPLANT
ELECT REM PT RETURN 9FT ADLT (ELECTROSURGICAL)
ELECTRODE REM PT RTRN 9FT ADLT (ELECTROSURGICAL) IMPLANT
GAUZE SPONGE 4X4 12PLY STRL (GAUZE/BANDAGES/DRESSINGS) ×3 IMPLANT
GLOVE BIOGEL PI IND STRL 9 (GLOVE) ×1 IMPLANT
GLOVE BIOGEL PI INDICATOR 9 (GLOVE) ×2
GLOVE SURG ORTHO 9.0 STRL STRW (GLOVE) ×3 IMPLANT
GOWN STRL REUS W/ TWL XL LVL3 (GOWN DISPOSABLE) ×2 IMPLANT
GOWN STRL REUS W/TWL XL LVL3 (GOWN DISPOSABLE) ×6
HANDPIECE INTERPULSE COAX TIP (DISPOSABLE)
KIT BASIN OR (CUSTOM PROCEDURE TRAY) ×3 IMPLANT
KIT TURNOVER KIT B (KITS) ×3 IMPLANT
MANIFOLD NEPTUNE II (INSTRUMENTS) ×3 IMPLANT
NS IRRIG 1000ML POUR BTL (IV SOLUTION) ×3 IMPLANT
PACK ORTHO EXTREMITY (CUSTOM PROCEDURE TRAY) ×3 IMPLANT
PAD ARMBOARD 7.5X6 YLW CONV (MISCELLANEOUS) ×6 IMPLANT
PADDING CAST ABS 4INX4YD NS (CAST SUPPLIES) ×2
PADDING CAST ABS COTTON 4X4 ST (CAST SUPPLIES) IMPLANT
SET HNDPC FAN SPRY TIP SCT (DISPOSABLE) IMPLANT
STOCKINETTE IMPERVIOUS 9X36 MD (GAUZE/BANDAGES/DRESSINGS) IMPLANT
SWAB COLLECTION DEVICE MRSA (MISCELLANEOUS) ×3 IMPLANT
SWAB CULTURE ESWAB REG 1ML (MISCELLANEOUS) IMPLANT
TOWEL OR 17X26 10 PK STRL BLUE (TOWEL DISPOSABLE) ×3 IMPLANT
TUBE CONNECTING 12'X1/4 (SUCTIONS) ×1
TUBE CONNECTING 12X1/4 (SUCTIONS) ×2 IMPLANT
YANKAUER SUCT BULB TIP NO VENT (SUCTIONS) ×3 IMPLANT

## 2019-01-28 NOTE — Progress Notes (Signed)
Orthopedic Tech Progress Note Patient Details:  RODNISHA BLOMGREN [birth year] [id number] PACU RN called up for this post op shoe Ortho Devices Type of Ortho Device: Postop shoe/boot Ortho Device/Splint Location: LRE Ortho Device/Splint Interventions: Application, Ordered, Adjustment   Post Interventions Patient Tolerated: Well Instructions Provided: Adjustment of device, Care of device   Janit Pagan 01/28/2019, 11:25 AM

## 2019-01-28 NOTE — Discharge Instructions (Signed)
May be weight bearing to tolerance on right foot.  Keep right leg elevated as much as possible.

## 2019-01-28 NOTE — Op Note (Signed)
01/28/2019  9:32 AM  PATIENT:  Lauren [name]    PRE-OPERATIVE DIAGNOSIS:  Right Achilles Contracture  POST-OPERATIVE DIAGNOSIS:  Same  PROCEDURE:  RIGHT ACHILLES TENDON LENGTHENING  SURGEON:  Newt Minion, MD  PHYSICIAN ASSISTANT:None ANESTHESIA:   General  PREOPERATIVE INDICATIONS:  [name] is a  68 y.o. female with a diagnosis of Right Achilles Contracture who failed conservative measures and elected for surgical management.    The risks benefits and alternatives were discussed with the patient preoperatively including but not limited to the risks of infection, bleeding, nerve injury, cardiopulmonary complications, the need for revision surgery, among others, and the patient was willing to proceed.  OPERATIVE IMPLANTS: None.  @ENCIMAGES @  OPERATIVE FINDINGS: Achilles tendon contracture start at 40 degrees of equinus contracture after lengthening patient had 20 degrees of dorsiflexion.  OPERATIVE PROCEDURE: Patient was brought the operating room and underwent general anesthetic.  After adequate levels anesthesia obtained patient's right lower extremity was prepped using DuraPrep draped into a sterile field a timeout was called.  A 64 Beaver blade was used to perform a Z-lengthening of the Achilles.  Contracture went from 40 degrees of equinus contracture to 20 degrees of dorsiflexion.  Sterile dressings were applied a posterior sugar tong splint was applied to maintain the lengthening.  Patient was extubated taken the PACU in stable condition.   DISCHARGE PLANNING:  Antibiotic duration: Preoperative antibiotics  Weightbearing: Weightbearing as tolerated  Pain medication: Prescription called in for Percocet  Dressing care/ Wound VAC: Follow-up in the office 1 week to change the dressing  Ambulatory devices: Not applicable  Discharge to: Home.  Follow-up: In the office 1 week post operative.

## 2019-01-28 NOTE — H&P (Signed)
Lauren [name] is an 68 y.o. female.   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 She has had 3 back surgeries and states she developed foot drop following the surgeries. She presents for Z-plasty lengthening of her right achilles tendon.   Past Medical History:  Diagnosis Date  . Adenomatous colon polyp   . Anemia   . Anxiety   . AVM (arteriovenous malformation)    cecum  . CAD (coronary artery disease)   . Carotid artery occlusion   . Cataract    bil cateracts removed  . CHF (congestive heart failure) ([place])   . COPD (chronic obstructive pulmonary disease) ([place])   . Cough   . DDD (degenerative disc disease), lumbar   . Depression   . Diabetes mellitus    Type II  . Elevated LFTs   . Endometrial ca [facility])    endometrial ca dx 4/11  . Foot drop   . GERD (gastroesophageal reflux disease)   . Gout 2015  . Hernia of flank   . Hydronephrosis   . Hyperlipidemia   . Hypertension   . Insomnia   . Neuropathy   . OAB (overactive bladder)   . Osteoarthritis   . Peripheral neuropathy    following chemotherapy  . Peripheral vascular disease ([place])   . Pneumonia 07/2017  . Psoriasis   . Pulmonary nodule    since 2016  . Radiation 05/01/2011-06/11/11   5600 cGy 28 fxs/periaortic  . Stroke [facility])    tia- Patient denies  . Thyroid nodule   . TIA (transient ischemic attack) 2015  . 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 Uterine cancer ([place])   . Ventral hernia    postoperative since 2009    Past Surgical History:  Procedure Laterality Date  . ABDOMINAL AORTOGRAM W/LOWER EXTREMITY Right 06/28/2018   Procedure: ABDOMINAL AORTOGRAM W/LOWER EXTREMITY;  Surgeon: [name], MD;  Location: [place] [facility];  Service: Cardiovascular;  Laterality: Right;  . ABDOMINAL HYSTERECTOMY  2009   for ca  . arterial duplex stent - SFA Left    . BACK SURGERY  Aug/sept 2014   3 back surgeries  (Aug 2014 & Sept 2014)  . BREAST BIOPSY Left over 10 years ago   benign  . CHOLECYSTECTOMY     biliteral  . COLONOSCOPY  11/13/2014   negative except for a small cecal AVM and 1 polyp  . ENDARTERECTOMY Right 08/17/2015   Procedure: Right Carotid Endartarectomy with patch Angioplasty ;  Surgeon: Mal Misty, MD;  Location: [place];  Service: Vascular;  Laterality: Right;  . EYE SURGERY Right Aug. 2013   Cataract, lens implant  . EYE SURGERY Left Sept. 2013   Cataract, lens implant  . I & D of lumbar fusion /removal of hardware for chronic infection  Jan. 8, 2015  . SPINE SURGERY      Family History  Problem Relation Age of Onset  . Stroke Mother   . Irritable bowel syndrome Mother   . Heart attack Father 72       died  . Heart disease Father        Heart Disease before age 74, rheumatic heart disease  . Hyperlipidemia Father   . Hypertension Father   . Diabetes Daughter   . Hypertension Daughter   . Irritable bowel syndrome  Daughter   . Colon cancer Neg Hx   . Esophageal cancer Neg Hx   . Pancreatic cancer Neg Hx   . Rectal cancer Neg Hx   . Stomach cancer Neg Hx   . Neuropathy Neg Hx    Social History:  reports that she quit smoking about 1 years ago. Her smoking use included cigarettes. She has a 4.00 pack-year smoking history. She has never used smokeless tobacco. She reports that she does not drink alcohol or use drugs.  Allergies:  Allergies  Allergen Reactions  . Ambien [Zolpidem Tartrate] Other (See Comments)    Per the pt, "it makes me go crazy"  . Sulfa Antibiotics Rash  . Ativan [Lorazepam] Other (See Comments)    Patient has the opposite effect when taking medication   . Elavil [Amitriptyline Hcl] Other (See Comments)    Gastritis  . Prozac [Fluoxetine Hcl] Anxiety and Other (See Comments)    irritability     Medications Prior to Admission  Medication Sig Dispense Refill  . Albuterol Sulfate  (PROAIR RESPICLICK) 045 (90 Base) MCG/ACT AEPB Inhale 2 puffs into the lungs every 6 (six) hours as needed (congestion, cough, wheezing).     Marland Kitchen amLODipine (NORVASC) 5 MG tablet Take 1 tablet (5 mg total) by mouth daily. 30 tablet 0  . aspirin EC 81 MG tablet Take 81 mg by mouth daily.    . betamethasone dipropionate (DIPROLENE) 0.05 % cream Apply 1 application topically 2 (two) times daily as needed (psoriasis).     . Cholecalciferol (VITAMIN D3) 1000 UNITS CAPS Take 1,000 Units by mouth daily.     . clopidogrel (PLAVIX) 75 MG tablet TAKE 1 TABLET BY MOUTH ONCE DAILY (Patient taking differently: Take 75 mg by mouth daily. ) 30 tablet 3  . escitalopram (LEXAPRO) 20 MG tablet Take 20 mg by mouth daily.    Marland Kitchen esomeprazole (NEXIUM) 40 MG capsule Take 40 mg by mouth daily at 8 pm.     . ferrous sulfate 325 (65 FE) MG tablet Take 325 mg by mouth daily.     . Fluticasone-Salmeterol (ADVAIR) 250-50 MCG/DOSE AEPB Inhale 1 puff into the lungs 2 (two) times daily.     . folic acid (FOLVITE) 1 MG tablet Take 1 mg by mouth daily at 12 noon.     . furosemide (LASIX) 40 MG tablet Take 40 mg by mouth daily.     Marland Kitchen gabapentin (NEURONTIN) 600 MG tablet Take 600 mg by mouth 3 (three) times daily. 0800, 1600, 0000    . insulin glargine (LANTUS) 100 UNIT/ML injection Inject 80 Units into the skin daily at 12 noon.     . isosorbide mononitrate (IMDUR) 30 MG 24 hr tablet Take 1 tablet (30 mg total) by mouth daily. 90 tablet 2  . losartan (COZAAR) 100 MG tablet Take 50 mg by mouth daily at 3 pm.    . magnesium oxide (MAG-OX) 400 MG tablet Take 400 mg by mouth daily.    . Melatonin 10 MG TABS Take 10 mg by mouth daily at 8 pm.     . metaxalone (SKELAXIN) 800 MG tablet Take 800 mg by mouth 3 (three) times daily. 0800, 1200, 1600    . methotrexate 2.5 MG tablet Take 5-7.5 tablets by mouth See admin instructions. Take 7.5mg  by mouth in the morning and 5mg  in the evening once weekly on Wednesday  3  . metoprolol succinate  (TOPROL-XL) 50 MG 24 hr tablet Take 50 mg by mouth daily.    Marland Kitchen  Multiple Vitamin (MULTIVITAMIN WITH MINERALS) TABS tablet Take 1 tablet by mouth daily.    . nitrofurantoin, macrocrystal-monohydrate, (MACROBID) 100 MG capsule Take 100 mg by mouth every evening.    . nitroGLYCERIN (NITROSTAT) 0.4 MG SL tablet Place 1 tablet (0.4 mg total) under the tongue every 5 (five) minutes as needed for chest pain. 25 tablet 3  . nortriptyline (PAMELOR) 10 MG capsule Take 10 mg by mouth at bedtime. (0000)    . Nortriptyline HCl (NORTRIPYTLINE HCL PO) Take 10 mg by mouth.     Marland Kitchen omeprazole (PRILOSEC) 40 MG capsule Take 40 mg by mouth daily at 8 pm.    . ondansetron (ZOFRAN-ODT) 4 MG disintegrating tablet Take 4 mg by mouth every 8 (eight) hours as needed for nausea or vomiting.     . OXYCONTIN 15 MG 12 hr tablet Take 1 tablet (15 mg total) by mouth every 12 (twelve) hours. 4 tablet 0  . potassium chloride SA (K-DUR,KLOR-CON) 20 MEQ tablet Take 10 mEq by mouth 2 (two) times daily. 1200 & 2000    . rosuvastatin (CRESTOR) 5 MG tablet Take 5 mg by mouth daily at 8 pm.     . senna (SENOKOT) 8.6 MG tablet Take 1 tablet by mouth as needed for constipation.     . tapentadol (NUCYNTA) 50 MG tablet Take 1 tablet (50 mg total) by mouth every 6 (six) hours. 6 am, noon, 6pm  And midnight (Patient taking differently: Take 50 mg by mouth every 6 (six) hours. 0800, 1200, 1800, 0000) 8 tablet 0  . vitamin B-12 (CYANOCOBALAMIN) 1000 MCG tablet Take 1,000 mcg by mouth daily.    . promethazine (PHENERGAN) 25 MG tablet Take 12.5-25 mg by mouth every 6 (six) hours as needed for nausea.       Results for orders placed or performed during the hospital encounter of 01/28/19 (from the past 48 hour(s))  Glucose, capillary     Status: Abnormal   Collection Time: 01/28/19  6:40 AM  Result Value Ref Range   Glucose-Capillary 212 (H) 70 - 99 mg/dL   No results found.  Review of Systems  All other systems reviewed and are  negative.   Blood pressure (!) 152/49, pulse 65, temperature 98.8 F (37.1 C), temperature source Oral, resp. rate 18, height 5\' 6"  (1.676 m), weight 90.3 kg, SpO2 96 %. Physical Exam  Constitutional: She is oriented to person, place, and time. She appears well-developed and well-nourished. No distress.  HENT:  Head: Normocephalic and atraumatic.  Neck: No tracheal deviation present. No thyromegaly present.  Cardiovascular: Normal rate.  Respiratory: Effort normal.  GI: Soft. She exhibits no distension.  Musculoskeletal:     Comments: With a Doppler she has a monophasic posterior tibial pulse and a weak dorsalis pedis pulse.  Her foot is warm she does have venous swelling in the right leg worse than the left.  She has good hip flexion knee flexion and extension strength she has an equinus contracture of the left foot with approximately 40 degrees equinovarus contracture.  She has plantar flexion strength on the right but no dorsiflexion strength on the right.   Neurological: She is alert and oriented to person, place, and time. No cranial nerve deficit. Coordination normal.  Skin: Skin is warm and dry. No rash noted. No erythema. No pallor.  Psychiatric: She has a normal mood and affect. Her behavior is normal. Judgment and thought content normal.     Assessment/Plan Plan Z-plasty lengthening of the right Achilles  tendon. The risks and benefits were discussed with the patient including the risks of infection, neurovascular injury, bleeding and need for further surgery. The patient states she understands and wishes to proceed at this time.   Erlinda Hong, PA-C 01/28/2019, 7:32 AM [employer] [phone]

## 2019-01-28 NOTE — Anesthesia Procedure Notes (Signed)
Procedure Name: LMA Insertion Date/Time: 01/28/2019 9:12 AM Performed by: Leonor Liv, CRNA Pre-anesthesia Checklist: Patient identified, Emergency Drugs available, Suction available and Patient being monitored Patient Re-evaluated:Patient Re-evaluated prior to induction Oxygen Delivery Method: Circle System Utilized Preoxygenation: Pre-oxygenation with 100% oxygen Induction Type: IV induction Ventilation: Mask ventilation without difficulty LMA: LMA inserted LMA Size: 4.0 Number of attempts: 1 Placement Confirmation: positive ETCO2 Tube secured with: Tape Dental Injury: Teeth and Oropharynx as per pre-operative assessment  Comments: LMA placed under direct supervision by [employer] paramedic student

## 2019-01-28 NOTE — Transfer of Care (Signed)
Immediate Anesthesia Transfer of Care Note  Patient: Lauren [name]  Procedure(s) Performed: RIGHT ACHILLES TENDON LENGTHENING (Right )  Patient Location: PACU  Anesthesia Type:General  Level of Consciousness: awake, alert  and oriented  Airway & Oxygen Therapy: Patient Spontanous Breathing and Patient connected to face mask oxygen  Post-op Assessment: Report given to RN, Post -op Vital signs reviewed and stable and Patient moving all extremities  Post vital signs: Reviewed and stable  Last Vitals:  Vitals Value Taken Time  BP 129/45 01/28/2019  9:42 AM  Temp    Pulse 74 01/28/2019  9:42 AM  Resp 11 01/28/2019  9:42 AM  SpO2 100 % 01/28/2019  9:42 AM  Vitals shown include unvalidated device data.  Last Pain:  Vitals:   01/28/19 0658  TempSrc:   PainSc: 5       Patients Stated Pain Goal: 3 (09/32/67 1245)  Complications: No apparent anesthesia complications

## 2019-01-31 ENCOUNTER — Encounter (INDEPENDENT_AMBULATORY_CARE_PROVIDER_SITE_OTHER): Payer: Self-pay | Admitting: Orthopedic Surgery

## 2019-01-31 NOTE — Progress Notes (Signed)
Office Visit Note   Patient: Lauren [name]           Date of Birth: 11/08/1951           MRN: [id number] Visit Date: 01/25/2019              Requested by: [name], MD [address] [place], [place] [address] PCP: [name], MD  Chief Complaint  Patient presents with  . Right Leg - Pain  . Left Leg - Pain      HPI: Patient is a 68 year old woman who is seen for evaluation for both lower extremities.  Patient has had foot drop on the right and uses a brace to hold her foot neutral.  Patient has fixed equinus deformity on the right and has pain with attempted weightbearing.  Assessment & Plan: Visit Diagnoses:  1. Peripheral vascular disease due to secondary diabetes mellitus ([place])   2. Lumbar pain with radiation down right leg     Plan: Due to the fixed equinus contracture of the right with pain with weightbearing patient would like to proceed with surgical intervention.  We will plan for a Z-lengthening of the Achilles will place her in a postoperative splint and follow-up in the office at which time anticipate we can either place her in bracing or a fracture boot for ambulation.  Follow-Up Instructions: Return in about 2 weeks (around 02/08/2019).   Ortho Exam  Patient is alert, oriented, no adenopathy, well-dressed, normal affect, normal respiratory effort. Examination patient has a good dorsalis pedis pulse bilaterally.  Her ankle-brachial indices showed decreased circulation in both lower extremities with peripheral vascular disease.  She has a fixed 45 degrees of equinus contracture on the right.  Imaging: No results found. No images are attached to the encounter.  Labs: Lab Results  Component Value Date   HGBA1C 8.4 (H) 01/28/2019   HGBA1C 5.9 (H) 08/22/2017   HGBA1C 6.9 (H) 08/17/2015   ESRSEDRATE 36 03/10/2017   REPTSTATUS 08/23/2017 FINAL 08/21/2017   GRAMSTAIN No WBC Seen 04/16/2017   GRAMSTAIN No Organisms Seen 04/16/2017   GRAMSTAIN  CYTOSPIN 04/16/2017   CULT  08/21/2017    NO GROWTH Performed at [facility], [place] [address], [place], [place] [address]    LABORGA NO GROWTH 04/16/2017     Lab Results  Component Value Date   ALBUMIN 3.6 07/30/2018   ALBUMIN 4.0 07/29/2018   ALBUMIN 3.7 10/13/2017    Body mass index is 32.12 kg/m.  Orders:  No orders of the defined types were placed in this encounter.  No orders of the defined types were placed in this encounter.    Procedures: No procedures performed  Clinical Data: No additional findings.  ROS:  All other systems negative, except as noted in the HPI. Review of Systems  Objective: Vital Signs: Ht 5\' 6"  (1.676 m)   Wt 199 lb (90.3 kg)   BMI 32.12 kg/m   Specialty Comments:  No specialty comments available.  PMFS History: Patient Active Problem List   Diagnosis Date Noted  . Achilles tendon contracture, right   . SBO (small bowel obstruction) ([place]) 07/30/2018  . Acute lower UTI 07/30/2018  . Hypernatremia 07/30/2018  . Peripheral vascular disease due to secondary diabetes mellitus ([place]) 05/17/2018  . Foot drop, bilateral 05/17/2018  . Foot drop, left 02/11/2018  . DOE (dyspnea on exertion) 01/28/2018  . Diabetes mellitus without complication ([place]) 62/83/1517  . Diastolic dysfunction 61/60/7371  . Abnormal liver function  tests 11/14/2017  . 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 Acute encephalopathy 08/22/2017  . Acute urinary retention 08/22/2017  . AKI (acute kidney injury) ([place]) 08/22/2017  . PAD (peripheral artery disease) ([place]) 08/15/2017  . Cor pulmonale, chronic ([place]) 08/15/2017  . Chronic diastolic heart failure ([place]) 08/15/2017  . History of cigarette smoking 08/15/2017  . Multiple pulmonary nodules determined by computed tomography of lung 07/31/2017  . 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 Pressure ulcer of left heel, stage 2 ([place]) 02/04/2017  . Altered mental status     . Community acquired pneumonia   . Encephalopathy   . Fall   . Weakness of both lower extremities   . Chronic pain   . Narcotic dependence ([place])   . Incontinence of feces   . Sepsis ([place]) 02/02/2017  . Carotid stenosis, bilateral 01/30/2017  . Aftercare following surgery of the circulatory system 01/30/2017  . Impingement syndrome of left shoulder 12/17/2016  . Coronary artery disease of native artery of native heart with stable angina pectoris ([place]) 10/07/2016  . Iron deficiency anemia 10/07/2016  . DDD (degenerative disc disease), lumbar 10/03/2016  . Fatigue 10/03/2016  . Intervertebral disc disease 10/03/2016  . Low back pain 10/03/2016  . Myelopathy ([place]) 10/03/2016  . Prolapsed lumbosacral intervertebral disc 10/03/2016  . Spondylisthesis 10/03/2016  . MRSA (methicillin resistant staph aureus) culture positive 10/10/2015  . Carotid stenosis 08/17/2015  . Preoperative cardiovascular examination 08/10/2015  . Tobacco abuse 08/10/2015  . 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 Acute osteomyelitis of spine ([place]) 08/28/2014  . Difficult or painful urination 08/28/2014  . H/O Spinal surgery 08/28/2014  . Elevated WBC count 08/28/2014  . Encounter for aftercare for long-term (current) use of antibiotics 08/28/2014  . N&V (nausea and vomiting) 08/28/2014  . Bladder retention 08/28/2014  . Infection of urinary tract 08/28/2014  . Ventral hernia 06/15/2013  . COPD GOLD III/IV 06/15/2013  . DJD (degenerative joint disease) of lumbar spine 06/15/2013  . Depression 06/15/2013  . Exertional angina ([place]) 06/15/2013  . Preoperative evaluation 06/15/2013  . Occlusion and stenosis of carotid artery without mention of cerebral infarction 03/02/2012  . Malignant neoplasm of corpus uteri, except isthmus ([place]) 01/19/2012  . Abnormal EKG 12/07/2011  . Essential hypertension, benign 12/07/2011  . Mixed hyperlipidemia 12/07/2011  . Carotid artery disease without cerebral infarction ([place])  12/07/2011   Past Medical History:  Diagnosis Date  . Adenomatous colon polyp   . Anemia   . Anxiety   . AVM (arteriovenous malformation)    cecum  . CAD (coronary artery disease)   . Carotid artery occlusion   . Cataract    bil cateracts removed  . CHF (congestive heart failure) ([place])   . COPD (chronic obstructive pulmonary disease) ([place])   . Cough   . DDD (degenerative disc disease), lumbar   . Depression   . Diabetes mellitus    Type II  . Elevated LFTs   . Endometrial ca [facility])    endometrial ca dx 4/11  . Foot drop   . GERD (gastroesophageal reflux disease)   . Gout 2015  . Hernia of flank   . Hydronephrosis   . Hyperlipidemia   . Hypertension   . Insomnia   . Neuropathy   . OAB (overactive bladder)   . Osteoarthritis   . Peripheral neuropathy    following chemotherapy  . Peripheral vascular disease ([place])   . Pneumonia 07/2017  . Psoriasis   . Pulmonary nodule  since 2016  . Radiation 05/01/2011-06/11/11   5600 cGy 28 fxs/periaortic  . Stroke [facility])    tia- Patient denies  . Thyroid nodule   . TIA (transient ischemic attack) 2015  . 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 Uterine cancer ([place])   . Ventral hernia    postoperative since 2009    Family History  Problem Relation Age of Onset  . Stroke Mother   . Irritable bowel syndrome Mother   . Heart attack Father 85       died  . Heart disease Father        Heart Disease before age 73, rheumatic heart disease  . Hyperlipidemia Father   . Hypertension Father   . Diabetes Daughter   . Hypertension Daughter   . Irritable bowel syndrome Daughter   . Colon cancer Neg Hx   . Esophageal cancer Neg Hx   . Pancreatic cancer Neg Hx   . Rectal cancer Neg Hx   . Stomach cancer Neg Hx   . Neuropathy Neg Hx     Past Surgical History:  Procedure Laterality Date  . ABDOMINAL AORTOGRAM W/LOWER EXTREMITY Right 06/28/2018   Procedure: ABDOMINAL AORTOGRAM W/LOWER EXTREMITY;  Surgeon:  [name], MD;  Location: [place] [facility];  Service: Cardiovascular;  Laterality: Right;  . ABDOMINAL HYSTERECTOMY  2009   for ca  . arterial duplex stent - SFA Left   . BACK SURGERY  Aug/sept 2014   3 back surgeries  (Aug 2014 & Sept 2014)  . BREAST BIOPSY Left over 10 years ago   benign  . CHOLECYSTECTOMY     biliteral  . COLONOSCOPY  11/13/2014   negative except for a small cecal AVM and 1 polyp  . ENDARTERECTOMY Right 08/17/2015   Procedure: Right Carotid Endartarectomy with patch Angioplasty ;  Surgeon: Mal Misty, MD;  Location: [place];  Service: Vascular;  Laterality: Right;  . EYE SURGERY Right Aug. 2013   Cataract, lens implant  . EYE SURGERY Left Sept. 2013   Cataract, lens implant  . I & D of lumbar fusion /removal of hardware for chronic infection  Jan. 8, 2015  . SPINE SURGERY     Social History   Occupational History  . Occupation: disabled  Tobacco Use  . Smoking status: Former Smoker    Packs/day: 0.10    Years: 40.00    Pack years: 4.00    Types: Cigarettes    Last attempt to quit: 01/2017    Years since quitting: 2.0  . Smokeless tobacco: Never Used  Substance and Sexual Activity  . Alcohol use: No    Alcohol/week: 0.0 standard drinks  . Drug use: No  . Sexual activity: Never

## 2019-01-31 NOTE — Anesthesia Postprocedure Evaluation (Signed)
Anesthesia Post Note  Patient: Lauren [name]  Procedure(s) Performed: RIGHT ACHILLES TENDON LENGTHENING (Right )     Patient location during evaluation: Other Anesthesia Type: General Level of consciousness: awake and alert Pain management: pain level controlled Vital Signs Assessment: post-procedure vital signs reviewed and stable Respiratory status: spontaneous breathing, nonlabored ventilation and respiratory function stable Cardiovascular status: blood pressure returned to baseline and stable Postop Assessment: no apparent nausea or vomiting Anesthetic complications: no    Last Vitals:  Vitals:   01/28/19 1100 01/28/19 1111  BP:  112/73  Pulse:  63  Resp:  (!) 21  Temp: 36.5 C   SpO2:  98%    Last Pain:  Vitals:   01/28/19 1100  TempSrc:   PainSc: 0-No pain                 Meriam Chojnowski,W. EDMOND

## 2019-02-01 ENCOUNTER — Encounter (HOSPITAL_COMMUNITY): Payer: Self-pay | Admitting: Orthopedic Surgery

## 2019-02-03 ENCOUNTER — Other Ambulatory Visit (INDEPENDENT_AMBULATORY_CARE_PROVIDER_SITE_OTHER): Payer: Self-pay | Admitting: Physician Assistant

## 2019-02-03 ENCOUNTER — Telehealth (INDEPENDENT_AMBULATORY_CARE_PROVIDER_SITE_OTHER): Payer: Self-pay | Admitting: Orthopedic Surgery

## 2019-02-03 MED ORDER — OXYCODONE-ACETAMINOPHEN 5-325 MG PO TABS: 1.0000 | ORAL_TABLET | Freq: Three times a day (TID) | ORAL | 0 refills | Status: DC | PRN

## 2019-02-03 NOTE — Telephone Encounter (Signed)
Called pt to advise that rx would be faxed to pharm. [address]

## 2019-02-03 NOTE — Telephone Encounter (Signed)
done

## 2019-02-03 NOTE — Telephone Encounter (Signed)
Patient's husband Careers adviser) called advised patient need Rx refilled (Oxycodone) The number to contact patient is [phone]

## 2019-02-04 ENCOUNTER — Ambulatory Visit: Payer: Medicare Other | Admitting: Cardiovascular Disease

## 2019-02-07 ENCOUNTER — Encounter (INDEPENDENT_AMBULATORY_CARE_PROVIDER_SITE_OTHER): Payer: Self-pay | Admitting: Physician Assistant

## 2019-02-07 ENCOUNTER — Ambulatory Visit (INDEPENDENT_AMBULATORY_CARE_PROVIDER_SITE_OTHER): Payer: Medicare Other | Admitting: Physician Assistant

## 2019-02-07 VITALS — Ht 66.0 in | Wt 199.0 lb

## 2019-02-07 DIAGNOSIS — M6701 Short Achilles tendon (acquired), right ankle: Secondary | ICD-10-CM

## 2019-02-07 DIAGNOSIS — E1351 Other specified diabetes mellitus with diabetic peripheral angiopathy without gangrene: Secondary | ICD-10-CM

## 2019-02-07 NOTE — Progress Notes (Signed)
Office Visit Note   Patient: Lauren [name]           Date of Birth: 06-Apr-1951           MRN: [id number] Visit Date: 02/07/2019              Requested by: [name], MD [address] [place], [place] [address] PCP: [name], MD  Chief Complaint  Patient presents with  . Right Leg - Routine Post Op    z lengthening       HPI: The patient is a 68 year old woman here with her husband who is seen for postoperative follow-up following a right Achilles tendon Z-plasty for lengthening on 01/28/2019. She is about 10 days postop.  She has been weightbearing as tolerated in a postop shoe but is minimally ambulatory.  She does stand some to transfer.  She reports initially her pain was quite severe but now it is much more tolerable and she has decreased her use of oral pain medications.  She reports her motion in the ankle is much improved from preoperatively.  Assessment & Plan: Visit Diagnoses:  1. Short Achilles tendon (acquired), right ankle   2. Peripheral vascular disease due to secondary diabetes mellitus ([place])     Plan: Discussed the patient should do some standing with her transfers and weightbearing over the right lower extremity.  She can shower and wash the area with Dial soap and water and apply a dressing as needed.  If her pain continues to improve we will plan on going ahead and getting her in double upright bracing with shoe and insert.  She will follow-up in 1 week.  Follow-Up Instructions: Return in about 1 week (around 02/14/2019).   Ortho Exam  Patient is alert, oriented, no adenopathy, well-dressed, normal affect, normal respiratory effort. The puncture wounds over the posterior right Achilles are closed and healing well and without signs of infection or cellulitis.  She is able to get her ankle to neutral dorsiflexion.  She continues to have slight equina varus of the foot but it is markedly better.  She has good pedal pulses.  Imaging: No  results found. No images are attached to the encounter.  Labs: Lab Results  Component Value Date   HGBA1C 8.4 (H) 01/28/2019   HGBA1C 5.9 (H) 08/22/2017   HGBA1C 6.9 (H) 08/17/2015   ESRSEDRATE 36 03/10/2017   REPTSTATUS 08/23/2017 FINAL 08/21/2017   GRAMSTAIN No WBC Seen 04/16/2017   GRAMSTAIN No Organisms Seen 04/16/2017   GRAMSTAIN CYTOSPIN 04/16/2017   CULT  08/21/2017    NO GROWTH Performed at [facility], [place] [address] [place], [place] [address]    LABORGA NO GROWTH 04/16/2017     Lab Results  Component Value Date   ALBUMIN 3.6 07/30/2018   ALBUMIN 4.0 07/29/2018   ALBUMIN 3.7 10/13/2017    Body mass index is 32.12 kg/m.  Orders:  No orders of the defined types were placed in this encounter.  No orders of the defined types were placed in this encounter.    Procedures: No procedures performed  Clinical Data: No additional findings.  ROS:  All other systems negative, except as noted in the HPI. Review of Systems  Objective: Vital Signs: Ht 5\' 6"  (1.676 m)   Wt 199 lb (90.3 kg)   BMI 32.12 kg/m   Specialty Comments:  No specialty comments available.  PMFS History: Patient Active Problem List   Diagnosis Date Noted  . Achilles tendon  contracture, right   . SBO (small bowel obstruction) ([place]) 07/30/2018  . Acute lower UTI 07/30/2018  . Hypernatremia 07/30/2018  . Peripheral vascular disease due to secondary diabetes mellitus ([place]) 05/17/2018  . Foot drop, bilateral 05/17/2018  . Foot drop, left 02/11/2018  . DOE (dyspnea on exertion) 01/28/2018  . Diabetes mellitus without complication ([place]) 67/67/2094  . Diastolic dysfunction 70/96/2836  . Abnormal liver function tests 11/14/2017  . CIDP (chronic inflammatory demyelinating polyneuropathy) ([place]) 09/09/2017  . Acute encephalopathy 08/22/2017  . Acute urinary retention 08/22/2017  . AKI (acute kidney injury) ([place]) 08/22/2017  . PAD (peripheral artery disease) ([place]) 08/15/2017  . Cor  pulmonale, chronic ([place]) 08/15/2017  . Chronic diastolic heart failure ([place]) 08/15/2017  . History of cigarette smoking 08/15/2017  . Multiple pulmonary nodules determined by computed tomography of lung 07/31/2017  . 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 Pressure ulcer of left heel, stage 2 ([place]) 02/04/2017  . Altered mental status   . Community acquired pneumonia   . Encephalopathy   . Fall   . Weakness of both lower extremities   . Chronic pain   . Narcotic dependence ([place])   . Incontinence of feces   . Sepsis ([place]) 02/02/2017  . Carotid stenosis, bilateral 01/30/2017  . Aftercare following surgery of the circulatory system 01/30/2017  . Impingement syndrome of left shoulder 12/17/2016  . Coronary artery disease of native artery of native heart with stable angina pectoris ([place]) 10/07/2016  . Iron deficiency anemia 10/07/2016  . DDD (degenerative disc disease), lumbar 10/03/2016  . Fatigue 10/03/2016  . Intervertebral disc disease 10/03/2016  . Low back pain 10/03/2016  . Myelopathy ([place]) 10/03/2016  . Prolapsed lumbosacral intervertebral disc 10/03/2016  . Spondylisthesis 10/03/2016  . MRSA (methicillin resistant staph aureus) culture positive 10/10/2015  . Carotid stenosis 08/17/2015  . Preoperative cardiovascular examination 08/10/2015  . Tobacco abuse 08/10/2015  . 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 Acute osteomyelitis of spine ([place]) 08/28/2014  . Difficult or painful urination 08/28/2014  . H/O Spinal surgery 08/28/2014  . Elevated WBC count 08/28/2014  . Encounter for aftercare for long-term (current) use of antibiotics 08/28/2014  . N&V (nausea and vomiting) 08/28/2014  . Bladder retention 08/28/2014  . Infection of urinary tract 08/28/2014  . Ventral hernia 06/15/2013  . COPD GOLD III/IV 06/15/2013  . DJD (degenerative joint disease) of lumbar spine 06/15/2013  . Depression 06/15/2013  . Exertional angina ([place]) 06/15/2013  . Preoperative evaluation  06/15/2013  . Occlusion and stenosis of carotid artery without mention of cerebral infarction 03/02/2012  . Malignant neoplasm of corpus uteri, except isthmus ([place]) 01/19/2012  . Abnormal EKG 12/07/2011  . Essential hypertension, benign 12/07/2011  . Mixed hyperlipidemia 12/07/2011  . Carotid artery disease without cerebral infarction ([place]) 12/07/2011   Past Medical History:  Diagnosis Date  . Adenomatous colon polyp   . Anemia   . Anxiety   . AVM (arteriovenous malformation)    cecum  . CAD (coronary artery disease)   . Carotid artery occlusion   . Cataract    bil cateracts removed  . CHF (congestive heart failure) ([place])   . COPD (chronic obstructive pulmonary disease) ([place])   . Cough   . DDD (degenerative disc disease), lumbar   . Depression   . Diabetes mellitus    Type II  . Elevated LFTs   . Endometrial ca [facility])    endometrial ca dx 4/11  . Foot drop   . GERD (gastroesophageal reflux disease)   .  Gout 2015  . Hernia of flank   . Hydronephrosis   . Hyperlipidemia   . Hypertension   . Insomnia   . Neuropathy   . OAB (overactive bladder)   . Osteoarthritis   . Peripheral neuropathy    following chemotherapy  . Peripheral vascular disease ([place])   . Pneumonia 07/2017  . Psoriasis   . Pulmonary nodule    since 2016  . Radiation 05/01/2011-06/11/11   5600 cGy 28 fxs/periaortic  . Stroke [facility])    tia- Patient denies  . Thyroid nodule   . TIA (transient ischemic attack) 2015  . 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 Uterine cancer ([place])   . Ventral hernia    postoperative since 2009    Family History  Problem Relation Age of Onset  . Stroke Mother   . Irritable bowel syndrome Mother   . Heart attack Father 28       died  . Heart disease Father        Heart Disease before age 86, rheumatic heart disease  . Hyperlipidemia Father   . Hypertension Father   . Diabetes Daughter   . Hypertension Daughter   . Irritable bowel syndrome  Daughter   . Colon cancer Neg Hx   . Esophageal cancer Neg Hx   . Pancreatic cancer Neg Hx   . Rectal cancer Neg Hx   . Stomach cancer Neg Hx   . Neuropathy Neg Hx     Past Surgical History:  Procedure Laterality Date  . ABDOMINAL AORTOGRAM W/LOWER EXTREMITY Right 06/28/2018   Procedure: ABDOMINAL AORTOGRAM W/LOWER EXTREMITY;  Surgeon: [name], MD;  Location: [place] [facility];  Service: Cardiovascular;  Laterality: Right;  . ABDOMINAL HYSTERECTOMY  2009   for ca  . ACHILLES TENDON LENGTHENING Right 01/28/2019   Procedure: RIGHT ACHILLES TENDON LENGTHENING;  Surgeon: Newt Minion, MD;  Location: [place];  Service: Orthopedics;  Laterality: Right;  . arterial duplex stent - SFA Left   . BACK SURGERY  Aug/sept 2014   3 back surgeries  (Aug 2014 & Sept 2014)  . BREAST BIOPSY Left over 10 years ago   benign  . CHOLECYSTECTOMY     biliteral  . COLONOSCOPY  11/13/2014   negative except for a small cecal AVM and 1 polyp  . ENDARTERECTOMY Right 08/17/2015   Procedure: Right Carotid Endartarectomy with patch Angioplasty ;  Surgeon: Mal Misty, MD;  Location: [place];  Service: Vascular;  Laterality: Right;  . EYE SURGERY Right Aug. 2013   Cataract, lens implant  . EYE SURGERY Left Sept. 2013   Cataract, lens implant  . I & D of lumbar fusion /removal of hardware for chronic infection  Jan. 8, 2015  . SPINE SURGERY     Social History   Occupational History  . Occupation: disabled  Tobacco Use  . Smoking status: Former Smoker    Packs/day: 0.10    Years: 40.00    Pack years: 4.00    Types: Cigarettes    Last attempt to quit: 01/2017    Years since quitting: 2.0  . Smokeless tobacco: Never Used  Substance and Sexual Activity  . Alcohol use: No    Alcohol/week: 0.0 standard drinks  . Drug use: No  . Sexual activity: Never

## 2019-02-14 ENCOUNTER — Ambulatory Visit (INDEPENDENT_AMBULATORY_CARE_PROVIDER_SITE_OTHER): Payer: Medicare Other | Admitting: Orthopedic Surgery

## 2019-02-14 ENCOUNTER — Encounter (INDEPENDENT_AMBULATORY_CARE_PROVIDER_SITE_OTHER): Payer: Self-pay | Admitting: Physician Assistant

## 2019-02-14 VITALS — Ht 66.0 in | Wt 199.0 lb

## 2019-02-14 DIAGNOSIS — M6701 Short Achilles tendon (acquired), right ankle: Secondary | ICD-10-CM

## 2019-02-22 ENCOUNTER — Encounter (INDEPENDENT_AMBULATORY_CARE_PROVIDER_SITE_OTHER): Payer: Self-pay | Admitting: Orthopedic Surgery

## 2019-02-22 NOTE — Progress Notes (Signed)
Office Visit Note   Patient: Lauren [name]           Date of Birth: Jan 08, 1951           MRN: [id number] Visit Date: 02/14/2019              Requested by: [name], MD [address] [place], [place] [address] PCP: [name], MD  Chief Complaint  Patient presents with  . Right Ankle - Routine Post Op    01/28/2019 achilles Z lengthening       HPI: Patient is a 68 year old woman who presents 2 weeks status post right Achilles tendon lengthening.  Patient has been nonweightbearing in a wheelchair.  She states she want to go to biotech to get her foot drop brace fitted.  Assessment & Plan: Visit Diagnoses:  1. Short Achilles tendon (acquired), right ankle     Plan: Recommend patient follow-up with [facility]otech for her foot drop anterior foot orthosis.  Follow-Up Instructions: Return in about 4 weeks (around 03/14/2019).   Ortho Exam  Patient is alert, oriented, no adenopathy, well-dressed, normal affect, normal respiratory effort. Examination the incisions are well-healed she has dorsiflexion to neutral.  Patient was given instructions and demonstrated Achilles stretching.  Imaging: No results found. No images are attached to the encounter.  Labs: Lab Results  Component Value Date   HGBA1C 8.4 (H) 01/28/2019   HGBA1C 5.9 (H) 08/22/2017   HGBA1C 6.9 (H) 08/17/2015   ESRSEDRATE 36 03/10/2017   REPTSTATUS 08/23/2017 FINAL 08/21/2017   GRAMSTAIN No WBC Seen 04/16/2017   GRAMSTAIN No Organisms Seen 04/16/2017   GRAMSTAIN CYTOSPIN 04/16/2017   CULT  08/21/2017    NO GROWTH Performed at [facility], [place] [address], [place], [place] [address]    LABORGA NO GROWTH 04/16/2017     Lab Results  Component Value Date   ALBUMIN 3.6 07/30/2018   ALBUMIN 4.0 07/29/2018   ALBUMIN 3.7 10/13/2017    Body mass index is 32.12 kg/m.  Orders:  No orders of the defined types were placed in this encounter.  No orders of the defined types were placed in  this encounter.    Procedures: No procedures performed  Clinical Data: No additional findings.  ROS:  All other systems negative, except as noted in the HPI. Review of Systems  Objective: Vital Signs: Ht 5\' 6"  (1.676 m)   Wt 199 lb (90.3 kg)   BMI 32.12 kg/m   Specialty Comments:  No specialty comments available.  PMFS History: Patient Active Problem List   Diagnosis Date Noted  . Achilles tendon contracture, right   . SBO (small bowel obstruction) ([place]) 07/30/2018  . Acute lower UTI 07/30/2018  . Hypernatremia 07/30/2018  . Peripheral vascular disease due to secondary diabetes mellitus ([place]) 05/17/2018  . Foot drop, bilateral 05/17/2018  . Foot drop, left 02/11/2018  . DOE (dyspnea on exertion) 01/28/2018  . Diabetes mellitus without complication ([place]) 67/11/4579  . Diastolic dysfunction 99/83/3825  . Abnormal liver function tests 11/14/2017  . CIDP (chronic inflammatory demyelinating polyneuropathy) ([place]) 09/09/2017  . Acute encephalopathy 08/22/2017  . Acute urinary retention 08/22/2017  . AKI (acute kidney injury) ([place]) 08/22/2017  . PAD (peripheral artery disease) ([place]) 08/15/2017  . Cor pulmonale, chronic ([place]) 08/15/2017  . Chronic diastolic heart failure ([place]) 08/15/2017  . History of cigarette smoking 08/15/2017  . Multiple pulmonary nodules determined by computed tomography of lung 07/31/2017  . 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 Pressure ulcer of  left heel, stage 2 ([place]) 02/04/2017  . Altered mental status   . Community acquired pneumonia   . Encephalopathy   . Fall   . Weakness of both lower extremities   . Chronic pain   . Narcotic dependence ([place])   . Incontinence of feces   . Sepsis ([place]) 02/02/2017  . Carotid stenosis, bilateral 01/30/2017  . Aftercare following surgery of the circulatory system 01/30/2017  . Impingement syndrome of left shoulder 12/17/2016  . Coronary artery disease of native artery of native heart with stable  angina pectoris ([place]) 10/07/2016  . Iron deficiency anemia 10/07/2016  . DDD (degenerative disc disease), lumbar 10/03/2016  . Fatigue 10/03/2016  . Intervertebral disc disease 10/03/2016  . Low back pain 10/03/2016  . Myelopathy ([place]) 10/03/2016  . Prolapsed lumbosacral intervertebral disc 10/03/2016  . Spondylisthesis 10/03/2016  . MRSA (methicillin resistant staph aureus) culture positive 10/10/2015  . Carotid stenosis 08/17/2015  . Preoperative cardiovascular examination 08/10/2015  . Tobacco abuse 08/10/2015  . 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 Acute osteomyelitis of spine ([place]) 08/28/2014  . Difficult or painful urination 08/28/2014  . H/O Spinal surgery 08/28/2014  . Elevated WBC count 08/28/2014  . Encounter for aftercare for long-term (current) use of antibiotics 08/28/2014  . N&V (nausea and vomiting) 08/28/2014  . Bladder retention 08/28/2014  . Infection of urinary tract 08/28/2014  . Ventral hernia 06/15/2013  . COPD GOLD III/IV 06/15/2013  . DJD (degenerative joint disease) of lumbar spine 06/15/2013  . Depression 06/15/2013  . Exertional angina ([place]) 06/15/2013  . Preoperative evaluation 06/15/2013  . Occlusion and stenosis of carotid artery without mention of cerebral infarction 03/02/2012  . Malignant neoplasm of corpus uteri, except isthmus ([place]) 01/19/2012  . Abnormal EKG 12/07/2011  . Essential hypertension, benign 12/07/2011  . Mixed hyperlipidemia 12/07/2011  . Carotid artery disease without cerebral infarction ([place]) 12/07/2011   Past Medical History:  Diagnosis Date  . Adenomatous colon polyp   . Anemia   . Anxiety   . AVM (arteriovenous malformation)    cecum  . CAD (coronary artery disease)   . Carotid artery occlusion   . Cataract    bil cateracts removed  . CHF (congestive heart failure) ([place])   . COPD (chronic obstructive pulmonary disease) ([place])   . Cough   . DDD (degenerative disc disease), lumbar   . Depression   . Diabetes mellitus      Type II  . Elevated LFTs   . Endometrial ca [facility])    endometrial ca dx 4/11  . Foot drop   . GERD (gastroesophageal reflux disease)   . Gout 2015  . Hernia of flank   . Hydronephrosis   . Hyperlipidemia   . Hypertension   . Insomnia   . Neuropathy   . OAB (overactive bladder)   . Osteoarthritis   . Peripheral neuropathy    following chemotherapy  . Peripheral vascular disease ([place])   . Pneumonia 07/2017  . Psoriasis   . Pulmonary nodule    since 2016  . Radiation 05/01/2011-06/11/11   5600 cGy 28 fxs/periaortic  . Stroke [facility])    tia- Patient denies  . Thyroid nodule   . TIA (transient ischemic attack) 2015  . 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 Uterine cancer ([place])   . Ventral hernia    postoperative since 2009    Family History  Problem Relation Age of Onset  . Stroke Mother   . Irritable  bowel syndrome Mother   . Heart attack Father 56       died  . Heart disease Father        Heart Disease before age 71, rheumatic heart disease  . Hyperlipidemia Father   . Hypertension Father   . Diabetes Daughter   . Hypertension Daughter   . Irritable bowel syndrome Daughter   . Colon cancer Neg Hx   . Esophageal cancer Neg Hx   . Pancreatic cancer Neg Hx   . Rectal cancer Neg Hx   . Stomach cancer Neg Hx   . Neuropathy Neg Hx     Past Surgical History:  Procedure Laterality Date  . ABDOMINAL AORTOGRAM W/LOWER EXTREMITY Right 06/28/2018   Procedure: ABDOMINAL AORTOGRAM W/LOWER EXTREMITY;  Surgeon: [name], MD;  Location: [place] [facility];  Service: Cardiovascular;  Laterality: Right;  . ABDOMINAL HYSTERECTOMY  2009   for ca  . ACHILLES TENDON LENGTHENING Right 01/28/2019   Procedure: RIGHT ACHILLES TENDON LENGTHENING;  Surgeon: Newt Minion, MD;  Location: [place];  Service: Orthopedics;  Laterality: Right;  . arterial duplex stent - SFA Left   . BACK SURGERY  Aug/sept 2014   3 back surgeries  (Aug 2014 & Sept 2014)   . BREAST BIOPSY Left over 10 years ago   benign  . CHOLECYSTECTOMY     biliteral  . COLONOSCOPY  11/13/2014   negative except for a small cecal AVM and 1 polyp  . ENDARTERECTOMY Right 08/17/2015   Procedure: Right Carotid Endartarectomy with patch Angioplasty ;  Surgeon: Mal Misty, MD;  Location: [place];  Service: Vascular;  Laterality: Right;  . EYE SURGERY Right Aug. 2013   Cataract, lens implant  . EYE SURGERY Left Sept. 2013   Cataract, lens implant  . I & D of lumbar fusion /removal of hardware for chronic infection  Jan. 8, 2015  . SPINE SURGERY     Social History   Occupational History  . Occupation: disabled  Tobacco Use  . Smoking status: Former Smoker    Packs/day: 0.10    Years: 40.00    Pack years: 4.00    Types: Cigarettes    Last attempt to quit: 01/2017    Years since quitting: 2.0  . Smokeless tobacco: Never Used  Substance and Sexual Activity  . Alcohol use: No    Alcohol/week: 0.0 standard drinks  . Drug use: No  . Sexual activity: Never

## 2019-03-07 ENCOUNTER — Other Ambulatory Visit: Payer: Self-pay | Admitting: Vascular Surgery

## 2019-03-14 ENCOUNTER — Ambulatory Visit (INDEPENDENT_AMBULATORY_CARE_PROVIDER_SITE_OTHER): Payer: Medicare Other | Admitting: Orthopedic Surgery

## 2019-04-08 ENCOUNTER — Other Ambulatory Visit: Payer: Self-pay | Admitting: Cardiovascular Disease

## 2019-04-08 NOTE — Telephone Encounter (Signed)
Isosorbide refilled. 

## 2019-04-18 ENCOUNTER — Other Ambulatory Visit: Payer: Self-pay | Admitting: Vascular Surgery

## 2019-05-03 ENCOUNTER — Other Ambulatory Visit (HOSPITAL_COMMUNITY): Payer: Self-pay | Admitting: Family Medicine

## 2019-05-03 ENCOUNTER — Other Ambulatory Visit: Payer: Self-pay

## 2019-05-03 ENCOUNTER — Ambulatory Visit (HOSPITAL_COMMUNITY)
Admission: RE | Admit: 2019-05-03 | Discharge: 2019-05-03 | Disposition: A | Discharge status: Home / Self Care | Payer: Medicare Other | Source: Ambulatory Visit | Attending: Family | Admitting: Family

## 2019-05-03 DIAGNOSIS — R609 Edema, unspecified: Secondary | ICD-10-CM | POA: Diagnosis present

## 2019-05-03 DIAGNOSIS — M79661 Pain in right lower leg: Secondary | ICD-10-CM | POA: Diagnosis present

## 2019-05-03 DIAGNOSIS — M79662 Pain in left lower leg: Principal | ICD-10-CM

## 2019-05-03 NOTE — Progress Notes (Signed)
Bilateral lower extremity venous duplex performed. Preliminary results called to Dr. Deboraha Sprang office and given to [facility]. Patient instructed to go home, Dr. Sandi Mariscal will have patient admitted to hospital. Patient and husband who was with her expressed understanding.

## 2019-05-04 ENCOUNTER — Telehealth: Payer: Self-pay | Admitting: Cardiovascular Disease

## 2019-05-04 NOTE — Telephone Encounter (Signed)
Returned the call to the patient's husband per the dpr. He was calling to give an update on the patient and to set up an appointment with Dr. Sallyanne Kuster.  The patient had a lower extremity venous completed yesterday that showed a dvt in the right leg. The patient has been started on eliquis 10 mg bid for the next 7 days. She will see her PCP next week to determine if the dosage will be decreased or stay at 10 mg bid.   An appointment was made for the patient on 07/08/2019 with Dr. Sallyanne Kuster. The husband would like to know if she needs to be seen earlier than that but would like to wait until they have gotten an update from Dr. Sandi Mariscal next week. He will ask Dr. Sandi Mariscal to send the notes from the visit.

## 2019-05-04 NOTE — Telephone Encounter (Signed)
I am sorry to hear that she has a DVT.  Agree with the choice of Eliquis.  It is standard procedure to drop the dose to 5 mg twice daily after the first 7 days when treating DVT. I know that July 10 seems far away for an appointment, but unless Dr. Sandi Mariscal thinks that it is imperative she have an earlier appointment, I would keep that date. MCr

## 2019-05-04 NOTE — Telephone Encounter (Signed)
Call returned to the patient's husband. He has been made aware to keep the appointment in July for now. He will keep the office updated if something sooner is needed.

## 2019-05-04 NOTE — Telephone Encounter (Signed)
Husband of Pt called to r/s appt from 05/14. There were no openings until July 10, and Husband feels like that will be too long to wait.

## 2019-05-11 ENCOUNTER — Telehealth: Payer: Self-pay | Admitting: Cardiovascular Disease

## 2019-05-11 NOTE — Telephone Encounter (Signed)
Called Sherry back with Dr. Deboraha Sprang office. Informed her that per Dr. Sallyanne Kuster, patient should stop the Plavix while she take eliquis. Judeen Hammans verbalized understanding and will let Dr. Sandi Mariscal know.

## 2019-05-11 NOTE — Telephone Encounter (Signed)
I think she should stop the Plavix while she takes Eliquis. MCr

## 2019-05-11 NOTE — Telephone Encounter (Signed)
New Message   Pt c/o medication issue:  1. Name of Medication: clopidogrel (PLAVIX) 75 MG tablet   2. How are you currently taking this medication (dosage and times per day)?   3. Are you having a reaction (difficulty breathing--STAT)?   4. What is your medication issue? Sherri is calling from Dr. Clair Gulling office to advise that the patient developed a DVT of the right leg last week and was started on Eliquis. They are wanting to know should the patient discontinue use of the Plavix.

## 2019-05-12 ENCOUNTER — Ambulatory Visit: Payer: Medicare Other | Admitting: Cardiovascular Disease

## 2019-05-12 ENCOUNTER — Encounter

## 2019-05-18 ENCOUNTER — Other Ambulatory Visit: Payer: Self-pay | Admitting: Family Medicine

## 2019-05-18 DIAGNOSIS — M5416 Radiculopathy, lumbar region: Secondary | ICD-10-CM

## 2019-06-16 ENCOUNTER — Encounter (HOSPITAL_BASED_OUTPATIENT_CLINIC_OR_DEPARTMENT_OTHER): Payer: Medicare Other | Attending: Internal Medicine

## 2019-06-16 DIAGNOSIS — I872 Venous insufficiency (chronic) (peripheral): Secondary | ICD-10-CM | POA: Diagnosis not present

## 2019-06-16 DIAGNOSIS — I10 Essential (primary) hypertension: Secondary | ICD-10-CM | POA: Diagnosis not present

## 2019-06-16 DIAGNOSIS — Z87891 Personal history of nicotine dependence: Secondary | ICD-10-CM | POA: Insufficient documentation

## 2019-06-16 DIAGNOSIS — J449 Chronic obstructive pulmonary disease, unspecified: Secondary | ICD-10-CM | POA: Insufficient documentation

## 2019-06-16 DIAGNOSIS — E1151 Type 2 diabetes mellitus with diabetic peripheral angiopathy without gangrene: Secondary | ICD-10-CM | POA: Diagnosis not present

## 2019-06-16 DIAGNOSIS — L89893 Pressure ulcer of other site, stage 3: Secondary | ICD-10-CM | POA: Insufficient documentation

## 2019-06-16 DIAGNOSIS — Z923 Personal history of irradiation: Secondary | ICD-10-CM | POA: Diagnosis not present

## 2019-06-16 DIAGNOSIS — Z794 Long term (current) use of insulin: Secondary | ICD-10-CM | POA: Diagnosis not present

## 2019-06-16 DIAGNOSIS — L89616 Pressure-induced deep tissue damage of right heel: Secondary | ICD-10-CM | POA: Insufficient documentation

## 2019-06-16 DIAGNOSIS — L304 Erythema intertrigo: Secondary | ICD-10-CM | POA: Diagnosis not present

## 2019-06-16 DIAGNOSIS — E1140 Type 2 diabetes mellitus with diabetic neuropathy, unspecified: Secondary | ICD-10-CM | POA: Insufficient documentation

## 2019-06-16 DIAGNOSIS — L89322 Pressure ulcer of left buttock, stage 2: Secondary | ICD-10-CM | POA: Diagnosis present

## 2019-06-16 DIAGNOSIS — Z7901 Long term (current) use of anticoagulants: Secondary | ICD-10-CM | POA: Insufficient documentation

## 2019-06-16 DIAGNOSIS — Z9221 Personal history of antineoplastic chemotherapy: Secondary | ICD-10-CM | POA: Insufficient documentation

## 2019-06-16 DIAGNOSIS — I2510 Atherosclerotic heart disease of native coronary artery without angina pectoris: Secondary | ICD-10-CM | POA: Insufficient documentation

## 2019-06-16 DIAGNOSIS — Z86718 Personal history of other venous thrombosis and embolism: Secondary | ICD-10-CM | POA: Insufficient documentation

## 2019-06-16 DIAGNOSIS — L89626 Pressure-induced deep tissue damage of left heel: Secondary | ICD-10-CM | POA: Diagnosis not present

## 2019-06-23 DIAGNOSIS — L89322 Pressure ulcer of left buttock, stage 2: Secondary | ICD-10-CM | POA: Diagnosis not present

## 2019-07-07 ENCOUNTER — Encounter (HOSPITAL_BASED_OUTPATIENT_CLINIC_OR_DEPARTMENT_OTHER): Payer: Medicare Other | Attending: Internal Medicine

## 2019-07-07 ENCOUNTER — Telehealth: Payer: Self-pay | Admitting: *Deleted

## 2019-07-07 DIAGNOSIS — I2510 Atherosclerotic heart disease of native coronary artery without angina pectoris: Secondary | ICD-10-CM | POA: Insufficient documentation

## 2019-07-07 DIAGNOSIS — Z9221 Personal history of antineoplastic chemotherapy: Secondary | ICD-10-CM | POA: Diagnosis not present

## 2019-07-07 DIAGNOSIS — I10 Essential (primary) hypertension: Secondary | ICD-10-CM | POA: Insufficient documentation

## 2019-07-07 DIAGNOSIS — Z923 Personal history of irradiation: Secondary | ICD-10-CM | POA: Diagnosis not present

## 2019-07-07 DIAGNOSIS — J449 Chronic obstructive pulmonary disease, unspecified: Secondary | ICD-10-CM | POA: Diagnosis not present

## 2019-07-07 DIAGNOSIS — Z872 Personal history of diseases of the skin and subcutaneous tissue: Secondary | ICD-10-CM | POA: Diagnosis not present

## 2019-07-07 DIAGNOSIS — L304 Erythema intertrigo: Secondary | ICD-10-CM | POA: Insufficient documentation

## 2019-07-07 DIAGNOSIS — Z09 Encounter for follow-up examination after completed treatment for conditions other than malignant neoplasm: Secondary | ICD-10-CM | POA: Insufficient documentation

## 2019-07-07 DIAGNOSIS — E1140 Type 2 diabetes mellitus with diabetic neuropathy, unspecified: Secondary | ICD-10-CM | POA: Insufficient documentation

## 2019-07-07 NOTE — Telephone Encounter (Signed)
    COVID-19 Pre-Screening Questions:  . In the past 7 to 10 days have you had a cough,  shortness of breath, headache, congestion, fever (100 or greater) body aches, chills, sore throat, or sudden loss of taste or sense of smell? No . Have you been around anyone with known Covid 19. No . Have you been around anyone who is awaiting Covid 19 test results in the past 7 to 10 days? No . Have you been around anyone who has been exposed to Covid 19, or has mentioned symptoms of Covid 19 within the past 7 to 10 days? No  If you have any concerns/questions about symptoms patients report during screening (either on the phone or at threshold). Contact the provider seeing the patient or DOD for further guidance.  If neither are available contact a member of the leadership team.  The husband, per the dpr, has been made aware that the patient needs to wear a mask tomorrow for her appointment. She may need to have her husband with her to help. They have both been prescreened.

## 2019-07-08 ENCOUNTER — Other Ambulatory Visit: Payer: Self-pay

## 2019-07-08 ENCOUNTER — Ambulatory Visit (INDEPENDENT_AMBULATORY_CARE_PROVIDER_SITE_OTHER): Payer: Medicare Other | Admitting: Cardiovascular Disease

## 2019-07-08 ENCOUNTER — Encounter: Payer: Self-pay | Admitting: Cardiovascular Disease

## 2019-07-08 VITALS — BP 127/64 | HR 79 | Ht 65.0 in | Wt 196.0 lb

## 2019-07-08 DIAGNOSIS — E1151 Type 2 diabetes mellitus with diabetic peripheral angiopathy without gangrene: Secondary | ICD-10-CM

## 2019-07-08 DIAGNOSIS — E1165 Type 2 diabetes mellitus with hyperglycemia: Secondary | ICD-10-CM

## 2019-07-08 DIAGNOSIS — I10 Essential (primary) hypertension: Secondary | ICD-10-CM | POA: Diagnosis not present

## 2019-07-08 DIAGNOSIS — I2721 Secondary pulmonary arterial hypertension: Secondary | ICD-10-CM

## 2019-07-08 DIAGNOSIS — I824Y2 Acute embolism and thrombosis of unspecified deep veins of left proximal lower extremity: Secondary | ICD-10-CM

## 2019-07-08 DIAGNOSIS — I779 Disorder of arteries and arterioles, unspecified: Secondary | ICD-10-CM

## 2019-07-08 DIAGNOSIS — I2729 Other secondary pulmonary hypertension: Secondary | ICD-10-CM

## 2019-07-08 DIAGNOSIS — E782 Mixed hyperlipidemia: Secondary | ICD-10-CM

## 2019-07-08 DIAGNOSIS — I739 Peripheral vascular disease, unspecified: Secondary | ICD-10-CM

## 2019-07-08 DIAGNOSIS — I50810 Right heart failure, unspecified: Secondary | ICD-10-CM

## 2019-07-08 DIAGNOSIS — I5032 Chronic diastolic (congestive) heart failure: Secondary | ICD-10-CM | POA: Diagnosis not present

## 2019-07-08 DIAGNOSIS — E7800 Pure hypercholesterolemia, unspecified: Secondary | ICD-10-CM

## 2019-07-08 DIAGNOSIS — K5520 Angiodysplasia of colon without hemorrhage: Secondary | ICD-10-CM

## 2019-07-08 DIAGNOSIS — IMO0002 Reserved for concepts with insufficient information to code with codable children: Secondary | ICD-10-CM

## 2019-07-08 NOTE — Progress Notes (Signed)
Cardiology Office Note    Date:  07/10/2019   ID:  Lauren [name], DOB 19-Mar-1951, MRN [id number]  PCP:  [name], MD  Cardiologist:   Sanda Klein, MD   Chief Complaint  Patient presents with   Congestive Heart Failure    History of Present Illness:  Lauren [name] is a 68 y.o. female with severe COPD, cor pulmonale/ right heart failure returning in follow-up.  Important comorbidities are RA and spinal stenosis (she is extremely sedentary, in wheelchair) and insulin requiring DM. She has extensive PAD and coronary calcification on CT chest. Very likely has CAD, but with successful medical management on antianginal therapy and preserved LVEF.  She remains very sedentary.  She is in a wheelchair and now has a indwelling urinary catheter.  She is had problems with slow healing wounds in her groin in the gluteal area and on her feet.  Recent wound care clinic.  Had surgery on her right Achilles tendon which has made her even less mobile.  In May she was diagnosed with an acute right femoral-popliteal DVT.  Her calf was swollen and painful.  This happened many months after birth orthopedic surgery.  She is currently taking anticoagulation without bleeding problems.  She denies angina or dyspnea but she is as mentioned and essentially immobile.  She has had well-controlled edema in her lower extremities and has not required any recent diuretic dose adjustments.  She has not had any focal neurological events and denies palpitations, dizziness or syncope.  She has not had overt severe hypotension, but her systolic blood pressure tends to run in the 100s.  Her last carotid scan was in May 2019 (Dr. Donzetta Matters) and she does not want to have her carotid scan this year.  In July of last year her lower extremity ABIs showed stable findings with severe ischemia (ABI 0.59) on the right and mildly reduced ABI (0.87).  Mediocre glycemic control with a hemoglobin A1c of 8.4% in January (with 8  %/year/mL).  It looks like she has not had a recent lipid profile but last year the findings are generally favorable and the current medications (total cholesterol 102, HDL 48, LDL 48, triglycerides 81).  Only VLDL particle number was slightly increased at 859.  Seeing Dr. Donzetta Matters in a few days, to have carotid duplex and ABI that same day.  Lauren [name] has coronary artery calcification on CT scan and has numerous coronary risk factors (hypertension, hyperlipidemia, diabetes, positive family history, ongoing cigarette smoking) and already has well-established peripheral arterial disease (R carotid endarterectomy 2016, L carotid 40%). However, she does not have angina pectoris and had a low risk nuclear stress test in 2014 and again in 2016. Echo in 2017 showed normal LVEF, mild LVH, elevated mean LA pressure and mild PAH 36 mm Hg). Her abdominal-pelvic CT also showed probably significant stenosis of the aorto-bilateral iliac bifurcation and multiple stenoses in the iliac and common femoral arteries on bothe sides. Activity is limited by severe lumbar radiculopathy, especially bad left sciatica pain. She has severe sensorimotor neuropathy. In 2015 she had EGD and colonoscopy and was found to have angiodysplasia. Repeat EGD in 2017 showed only mild antral erythema.  Past Medical History:  Diagnosis Date   Adenomatous colon polyp    Anemia    Anxiety    AVM (arteriovenous malformation)    cecum   CAD (coronary artery disease)    Carotid artery occlusion    Cataract    bil cateracts removed  CHF (congestive heart failure) (HCC)    COPD (chronic obstructive pulmonary disease) (HCC)    Cough    DDD (degenerative disc disease), lumbar    Depression    Diabetes mellitus    Type II   Elevated LFTs    Endometrial ca ([place])    endometrial ca dx 4/11   Foot drop    GERD (gastroesophageal reflux disease)    Gout 2015   Hernia of flank    Hydronephrosis    Hyperlipidemia     Hypertension    Insomnia    Neuropathy    OAB (overactive bladder)    Osteoarthritis    Peripheral neuropathy    following chemotherapy   Peripheral vascular disease ([place])    Pneumonia 07/2017   Psoriasis    Pulmonary nodule    since 2016   Radiation 05/01/2011-06/11/11   5600 cGy 28 fxs/periaortic   Stroke ([place])    tia- Patient denies   Thyroid nodule    TIA (transient ischemic attack) 2015   Tumor cells, malignant    radiation tx 05/2011 for spinal tumor   Uterine cancer ([place])    Ventral hernia    postoperative since 2009    Past Surgical History:  Procedure Laterality Date   ABDOMINAL AORTOGRAM W/LOWER EXTREMITY Right 06/28/2018   Procedure: ABDOMINAL AORTOGRAM W/LOWER EXTREMITY;  Surgeon: [name], MD;  Location: [facility];  Service: Cardiovascular;  Laterality: Right;   ABDOMINAL HYSTERECTOMY  2009   for ca   ACHILLES TENDON LENGTHENING Right 01/28/2019   Procedure: RIGHT ACHILLES TENDON LENGTHENING;  Surgeon: Newt Minion, MD;  Location: [facility];  Service: Orthopedics;  Laterality: Right;   arterial duplex stent - SFA Left    BACK SURGERY  Aug/sept 2014   3 back surgeries  (Aug 2014 & Sept 2014)   BREAST BIOPSY Left over 10 years ago   benign   CHOLECYSTECTOMY     biliteral   COLONOSCOPY  11/13/2014   negative except for a small cecal AVM and 1 polyp   ENDARTERECTOMY Right 08/17/2015   Procedure: Right Carotid Endartarectomy with patch Angioplasty ;  Surgeon: Mal Misty, MD;  Location: [facility];  Service: Vascular;  Laterality: Right;   EYE SURGERY Right Aug. 2013   Cataract, lens implant   EYE SURGERY Left Sept. 2013   Cataract, lens implant   I & D of lumbar fusion /removal of hardware for chronic infection  Jan. 8, 2015   SPINE SURGERY      Current Medications: Outpatient Medications Prior to Visit  Medication Sig Dispense Refill   Albuterol Sulfate (PROAIR RESPICLICK) 381 (90 Base) MCG/ACT AEPB Inhale 2  puffs into the lungs every 6 (six) hours as needed (congestion, cough, wheezing).      amLODipine (NORVASC) 5 MG tablet Take 1 tablet (5 mg total) by mouth daily. 30 tablet 0   apixaban (ELIQUIS) 5 MG TABS tablet Take 5 mg by mouth 2 (two) times daily.     betamethasone dipropionate (DIPROLENE) 0.05 % cream Apply 1 application topically 2 (two) times daily as needed (psoriasis).      Cholecalciferol (VITAMIN D3) 1000 UNITS CAPS Take 1,000 Units by mouth daily.      escitalopram (LEXAPRO) 20 MG tablet Take 20 mg by mouth daily.     esomeprazole (NEXIUM) 40 MG capsule Take 40 mg by mouth daily at 8 pm.      ferrous sulfate 325 (65 FE) MG tablet Take 325 mg by  mouth daily.      Fluticasone-Salmeterol (ADVAIR) 250-50 MCG/DOSE AEPB Inhale 1 puff into the lungs 2 (two) times daily.      folic acid (FOLVITE) 1 MG tablet Take 1 mg by mouth daily at 12 noon.      furosemide (LASIX) 40 MG tablet Take 40 mg by mouth daily.      gabapentin (NEURONTIN) 600 MG tablet Take 600 mg by mouth 3 (three) times daily. 0800, 1600, 0000     insulin glargine (LANTUS) 100 UNIT/ML injection Inject 80 Units into the skin daily at 12 noon.      Insulin Glargine-Lixisenatide (SOLIQUA) 100-33 UNT-MCG/ML SOPN Inject 60 Units as directed as directed.     isosorbide mononitrate (IMDUR) 30 MG 24 hr tablet Take 1 tablet by mouth once daily 90 tablet 1   losartan (COZAAR) 100 MG tablet Take 50 mg by mouth daily at 3 pm.     magnesium oxide (MAG-OX) 400 MG tablet Take 400 mg by mouth daily.     Melatonin 10 MG TABS Take 10 mg by mouth daily at 8 pm.      metaxalone (SKELAXIN) 800 MG tablet Take 800 mg by mouth 3 (three) times daily. 0800, 1200, 1600     methotrexate 2.5 MG tablet Take 5-7.5 tablets by mouth See admin instructions. Take 7.48m by mouth in the morning and 517min the evening once weekly on Wednesday  3   metoprolol succinate (TOPROL-XL) 50 MG 24 hr tablet Take 50 mg by mouth daily.     Multiple  Vitamin (MULTIVITAMIN WITH MINERALS) TABS tablet Take 1 tablet by mouth daily.     nitrofurantoin, macrocrystal-monohydrate, (MACROBID) 100 MG capsule Take 100 mg by mouth every evening.     nitroGLYCERIN (NITROSTAT) 0.4 MG SL tablet Place 1 tablet (0.4 mg total) under the tongue every 5 (five) minutes as needed for chest pain. 25 tablet 3   nortriptyline (PAMELOR) 10 MG capsule Take 10 mg by mouth at bedtime. (0000)     Nortriptyline HCl (NORTRIPYTLINE HCL PO) Take 10 mg by mouth.      omeprazole (PRILOSEC) 40 MG capsule Take 40 mg by mouth daily at 8 pm.     ondansetron (ZOFRAN-ODT) 4 MG disintegrating tablet Take 4 mg by mouth every 8 (eight) hours as needed for nausea or vomiting.      OXYCONTIN 15 MG 12 hr tablet Take 1 tablet (15 mg total) by mouth every 12 (twelve) hours. 4 tablet 0   potassium chloride SA (K-DUR,KLOR-CON) 20 MEQ tablet Take 10 mEq by mouth 2 (two) times daily. 1200 & 2000     promethazine (PHENERGAN) 25 MG tablet Take 12.5-25 mg by mouth every 6 (six) hours as needed for nausea.      rosuvastatin (CRESTOR) 5 MG tablet Take 5 mg by mouth daily at 8 pm.      senna (SENOKOT) 8.6 MG tablet Take 1 tablet by mouth as needed for constipation.      tapentadol (NUCYNTA) 50 MG tablet Take 1 tablet (50 mg total) by mouth every 6 (six) hours. 6 am, noon, 6pm  And midnight (Patient taking differently: Take 50 mg by mouth every 6 (six) hours. 0800, 1200, 1800, 0000) 8 tablet 0   vitamin B-12 (CYANOCOBALAMIN) 1000 MCG tablet Take 1,000 mcg by mouth daily.     aspirin EC 81 MG tablet Take 81 mg by mouth daily.     clopidogrel (PLAVIX) 75 MG tablet Take 1 tablet by mouth once daily (  Patient not taking: Reported on 07/08/2019) 30 tablet 6   oxyCODONE-acetaminophen (PERCOCET/ROXICET) 5-325 MG tablet Take 1 tablet by mouth every 8 (eight) hours as needed for severe pain (break through pain only). (Patient not taking: Reported on 07/08/2019) 20 tablet 0   No facility-administered  medications prior to visit.      Allergies:   Ambien [zolpidem tartrate], Sulfa antibiotics, Ativan [lorazepam], Elavil [amitriptyline hcl], and Prozac [fluoxetine hcl]   Social History   Socioeconomic History   Marital status: Married    Spouse name: Not on file   Number of children: 1   Years of education: College   Highest education level: Not on file  Occupational History   Occupation: disabled  Scientist, product/process development strain: Not on file   Food insecurity    Worry: Not on file    Inability: Not on file   Transportation needs    Medical: Not on file    Non-medical: Not on file  Tobacco Use   Smoking status: Former Smoker    Packs/day: 0.10    Years: 40.00    Pack years: 4.00    Types: Cigarettes    Quit date: 01/2017    Years since quitting: 2.4   Smokeless tobacco: Never Used  Substance and Sexual Activity   Alcohol use: No    Alcohol/week: 0.0 standard drinks   Drug use: No   Sexual activity: Never  Lifestyle   Physical activity    Days per week: Not on file    Minutes per session: Not on file   Stress: Not on file  Relationships   Social connections    Talks on phone: Not on file    Gets together: Not on file    Attends religious service: Not on file    Active member of club or organization: Not on file    Attends meetings of clubs or organizations: Not on file    Relationship status: Not on file  Other Topics Concern   Not on file  Social History Narrative   Married, 1 daughter   Disabled   Right-hand   Caffeine: caffeine free sodas     Family History:  The patient's family history includes Diabetes in her daughter; Heart attack (age of onset: 56) in her father; Heart disease in her father; Hyperlipidemia in her father; Hypertension in her daughter and father; Irritable bowel syndrome in her daughter and mother; Stroke in her mother.   ROS:   Please see the history of present illness.    ROS all other systems are  reviewed and are negative   PHYSICAL EXAM:   VS:  BP 127/64    Pulse 79    Ht '5\' 5"'  (1.651 m)    Wt 196 lb (88.9 kg)    SpO2 97%    BMI 32.62 kg/m      General: Alert, oriented x3, no distress, obese.  In a wheelchair. Head: no evidence of trauma, PERRL, EOMI, no exophtalmos or lid lag, no myxedema, no xanthelasma; normal ears, nose and oropharynx Neck: normal jugular venous pulsations and no hepatojugular reflux; brisk carotid pulses without delay, bilateral faint carotid bruits Chest: clear to auscultation, no signs of consolidation by percussion or palpation, normal fremitus, symmetrical and full respiratory excursions Cardiovascular: normal position and quality of the apical impulse, regular rhythm, normal first and second heart sounds, no murmurs, rubs or gallops Abdomen: no tenderness or distention, no masses by palpation, no abnormal pulsatility or arterial bruits, normal  bowel sounds, no hepatosplenomegaly Extremities: no clubbing, cyanosis or edema; 2+ radial, ulnar and brachial pulses bilaterally; 2+ right femoral, posterior tibial and dorsalis pedis pulses; 2+ left femoral, posterior tibial and dorsalis pedis pulses; no subclavian or femoral bruits.  Orthosis on left foot/ankle. Neurological: grossly nonfocal Psych: Normal mood and affect    Wt Readings from Last 3 Encounters:  07/08/19 196 lb (88.9 kg)  02/14/19 199 lb (90.3 kg)  02/07/19 199 lb (90.3 kg)      Studies/Labs Reviewed:   EKG:  EKG is ordered today.  It shows normal sinus rhythm and is a completely normal tracing.  QTc 433 ms.  Recent Labs: 09/02/2016 Total cholesterol 150, LDL 86, LDL particle #1065, HDL 38, triglyceride 129 Hemoglobin A1c 5.8% Creatinine 1.15, hemoglobin 9.9 with microcytic hypochromic indices, normal TSH September 14 ferritin 18, iron saturation 15%  07/20/2017 and 07/30/2017: BNP 156, A1c 7.2%, Hgb 10.4, AST 55, ALT 111, Bili 0.3, alk phos 49, K 4.6, creat 1.21, glu 79 September 21, 2017 labs showed hemoglobin 10.7, normal liver function tests, creatinine 0.9, NT-proBNP 330 (upper range of normal), hemoglobin A1c 8.1%, mildly reduced magnesium of 1.6 03/18/2017  Chol 112, HDL 48, LDL 48 and LDL-P 859, TG 81, creat 1.1, Hgb A1c 8%.  05/16/2019 Creatinine 1.08, glucose 250, potassium 4.4, hemoglobin 11.3 Normal liver function tests October 2019 ASSESSMENT:    1. Mixed hyperlipidemia   2. Essential hypertension, benign   3. Chronic diastolic heart failure ([place])   4. PAH (pulmonary artery hypertension) ([place])   5. 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 DVT, lower extremity, proximal, acute, left ([place])   7. DM (diabetes mellitus) type II uncontrolled, periph vascular disorder ([place])   8. Hypercholesterolemia   9. Carotid artery disease without cerebral infarction ([place])   10. Cecal angiodysplasia     PLAN:  In order of problems listed above:  1. CHF: Stable weight, no overt hypervolemia on exam.  No complaints of dyspnea. 2. RHF/cor pulmonale/PAH: Right heart failure is the major contributor to her fluid retention.   3. CAD: No complaints of angina on combination therapy with amlodipine, metoprolol, isosorbide.  Very sedentary.  On aspirin and statin.  She is not a candidate for bypass surgery due to her lung problems and has preserved left ventricular systolic function.  In the past at least, she was a poor candidate for percutaneous revascularization due to GI bleeding.   4. Anticoagulation: she seems to be tolerating anticoagulation with Eliquis.  Stop aspirin while on anticoagulants. 5. HTN: Blood pressure has been in the normal range. 6. PAD: At least moderate ischemia in the left lower extremity, but she does not have claudication since she is so sedentary. 7. Carotid stenosis: Last scan performed in May 2019 showed a patent right carotid endarterectomy site and mild, stable disease on the left. 8. DM: Glucose control has deteriorated  slightly. 9. HLP: Her lipid profile is good on rosuvastatin. 10. Iron deficiency anemia: Hemoglobin has been stable around 11.  Thankfully without overt bleeding while on Eliquis.  Known to have angiodysplasia of the right colon.  Medication Adjustments/Labs and Tests Ordered: Current medicines are reviewed at length with the patient today.  Concerns regarding medicines are outlined above.  Medication changes, Labs and Tests ordered today are listed in the Patient Instructions below. Patient Instructions  Medication Instructions:  STOP the Aspirin  If you need a refill on your cardiac medications before your next appointment, please call your  pharmacy.   Lab work: Dr. Sallyanne Kuster would like for you to have FASTING Lipids within the next few weeks.  Testing/Procedures: None ordered  Follow-Up: At [facility], you and your health needs are our priority.  As part of our continuing mission to provide you with exceptional heart care, we have created designated Provider Care Teams.  These Care Teams include your primary Cardiologist (physician) and Advanced Practice Providers (APPs -  Physician Assistants and Nurse Practitioners) who all work together to provide you with the care you need, when you need it. You will need a follow up appointment in 12 months.  Please call our office 2 months in advance to schedule this appointment.  You may see Sanda Klein, MD or one of the following Advanced Practice Providers on your designated Care Team: Almyra Deforest, PA-C  Fabian Sharp, [place]        Signed, Sanda Klein, MD  07/10/2019 3:27 PM    [address], [place], [place]  [id number] Phone: [phone]; Fax: [phone]

## 2019-07-08 NOTE — Patient Instructions (Signed)
Medication Instructions:  STOP the Aspirin  If you need a refill on your cardiac medications before your next appointment, please call your pharmacy.   Lab work: Dr. Sallyanne Kuster would like for you to have FASTING Lipids within the next few weeks.  Testing/Procedures: None ordered  Follow-Up: At [facility], you and your health needs are our priority.  As part of our continuing mission to provide you with exceptional heart care, we have created designated Provider Care Teams.  These Care Teams include your primary Cardiologist (physician) and Advanced Practice Providers (APPs -  Physician Assistants and Nurse Practitioners) who all work together to provide you with the care you need, when you need it. You will need a follow up appointment in 12 months.  Please call our office 2 months in advance to schedule this appointment.  You may see Sanda Klein, MD or one of the following Advanced Practice Providers on your designated Care Team: [place], [place] . Fabian Sharp, PA-C

## 2019-07-10 ENCOUNTER — Encounter: Payer: Self-pay | Admitting: Cardiovascular Disease

## 2019-07-10 DIAGNOSIS — E7800 Pure hypercholesterolemia, unspecified: Secondary | ICD-10-CM | POA: Insufficient documentation

## 2019-07-10 DIAGNOSIS — I2721 Secondary pulmonary arterial hypertension: Secondary | ICD-10-CM | POA: Insufficient documentation

## 2019-07-10 DIAGNOSIS — I824Y2 Acute embolism and thrombosis of unspecified deep veins of left proximal lower extremity: Secondary | ICD-10-CM | POA: Insufficient documentation

## 2019-07-10 DIAGNOSIS — I2729 Other secondary pulmonary hypertension: Secondary | ICD-10-CM | POA: Insufficient documentation

## 2019-07-10 DIAGNOSIS — I50810 Right heart failure, unspecified: Secondary | ICD-10-CM | POA: Insufficient documentation

## 2019-08-31 ENCOUNTER — Ambulatory Visit (INDEPENDENT_AMBULATORY_CARE_PROVIDER_SITE_OTHER): Payer: Medicare Other | Admitting: Podiatry

## 2019-08-31 ENCOUNTER — Encounter: Payer: Self-pay | Admitting: Podiatry

## 2019-08-31 ENCOUNTER — Other Ambulatory Visit: Payer: Self-pay

## 2019-08-31 DIAGNOSIS — I999 Unspecified disorder of circulatory system: Secondary | ICD-10-CM

## 2019-08-31 DIAGNOSIS — M79676 Pain in unspecified toe(s): Secondary | ICD-10-CM | POA: Diagnosis not present

## 2019-08-31 DIAGNOSIS — M79671 Pain in right foot: Secondary | ICD-10-CM

## 2019-08-31 DIAGNOSIS — M79609 Pain in unspecified limb: Secondary | ICD-10-CM

## 2019-08-31 DIAGNOSIS — B351 Tinea unguium: Secondary | ICD-10-CM

## 2019-08-31 MED ORDER — IBUPROFEN 200 MG PO TABS: 600.0000 mg | ORAL_TABLET | Freq: Four times a day (QID) | ORAL | Status: DC | PRN

## 2019-08-31 NOTE — Progress Notes (Signed)
Subjective:   Patient ID: Lauren [name], female   DOB: 68 y.o.   MRN: [id number]   HPI Patient presents with redness of the lesser digits right and states that she has had history of being seen by vascular doctor has not been seen in almost a year and states her toes have become very tender with thick nailbeds 1-5 right foot that she cannot cut   ROS      Objective:  Physical Exam  Neurovascular status diminished with diminished PT and DP pulses noted and thick incurvated nailbeds 1-5 right with no breakdown of tissue noted     Assessment:  Possibility for change in vascular status which needs to be evaluated there can make an appointment ASAP with pain vascular with a gone in the past and 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  Debrided nailbeds 1-5 right foot and if any breakdown of tissue or any other pathology were to occur patient will be seen back immediately by [place]

## 2019-09-01 ENCOUNTER — Telehealth: Payer: Self-pay | Admitting: Vascular Surgery

## 2019-09-01 NOTE — Telephone Encounter (Signed)
Lauren [name] from [place] at [facility] called in for patient.  Pt is have right lower leg redness and blue Great and second toe.  She also says she needs to get ultrasounds of her leg.  Patient is only accepting afternoon appointments.  I advised her to go to the ED or Urgent care if we cannot accommodate her Ultrasound and appointment times.  Pt verbalized understanding but says she will not go there.  Wayburn Shaler E., LPN.

## 2019-09-06 ENCOUNTER — Other Ambulatory Visit: Payer: Self-pay

## 2019-09-06 DIAGNOSIS — I739 Peripheral vascular disease, unspecified: Secondary | ICD-10-CM

## 2019-09-07 ENCOUNTER — Other Ambulatory Visit: Payer: Self-pay

## 2019-09-07 DIAGNOSIS — I739 Peripheral vascular disease, unspecified: Secondary | ICD-10-CM

## 2019-09-08 ENCOUNTER — Ambulatory Visit (INDEPENDENT_AMBULATORY_CARE_PROVIDER_SITE_OTHER): Payer: Medicare Other | Admitting: Physician Assistant

## 2019-09-08 ENCOUNTER — Other Ambulatory Visit: Payer: Self-pay

## 2019-09-08 ENCOUNTER — Ambulatory Visit (INDEPENDENT_AMBULATORY_CARE_PROVIDER_SITE_OTHER)
Admission: RE | Admit: 2019-09-08 | Discharge: 2019-09-08 | Disposition: A | Discharge status: Home / Self Care | Payer: Medicare Other | Source: Ambulatory Visit | Attending: Vascular Surgery | Admitting: Vascular Surgery

## 2019-09-08 ENCOUNTER — Ambulatory Visit (HOSPITAL_COMMUNITY)
Admission: RE | Admit: 2019-09-08 | Discharge: 2019-09-08 | Disposition: A | Discharge status: Home / Self Care | Payer: Medicare Other | Source: Ambulatory Visit | Attending: Family | Admitting: Family

## 2019-09-08 ENCOUNTER — Encounter: Payer: Self-pay | Admitting: *Deleted

## 2019-09-08 ENCOUNTER — Other Ambulatory Visit: Payer: Self-pay | Admitting: *Deleted

## 2019-09-08 VITALS — BP 151/71 | HR 83 | Temp 97.3°F | Resp 16 | Ht 66.5 in | Wt 200.0 lb

## 2019-09-08 DIAGNOSIS — I739 Peripheral vascular disease, unspecified: Secondary | ICD-10-CM | POA: Diagnosis present

## 2019-09-08 DIAGNOSIS — I70221 Atherosclerosis of native arteries of extremities with rest pain, right leg: Secondary | ICD-10-CM | POA: Diagnosis not present

## 2019-09-08 NOTE — Progress Notes (Signed)
Established Critical Limb Ischemia Patient   History of Present Illness   Lauren [name] is a 68 y.o. (07/18/1951) female with past medical history significant for hypertension, hyperlipidemia, insulin-dependent diabetes mellitus, severe COPD, cor pulmonale, and CAD who presents with chief complaint worsening pain R foot and great toe.  Surgical history significant for right carotid endarterectomy in 2016 by Dr. Kellie Simmering.  Patient is also had left SFA and popliteal stenting by Dr. Donzetta Matters in July 2019.  She describes over the past 2 months that she has had occasional rest pain of right foot which has been increasingly painful and more regular over the past 2 weeks.  She has bilateral foot drop and admittedly does not walk anymore.  She is however able to transfer from chair with some assist.  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  She is a former tobacco smoker.  It should also be noted that due to extensive history of back pain and spine surgery she is unable to lie flat and had a difficult time tolerating arterial duplex in office today.  She was sedated for aortogram in July 2019.  Current Outpatient Medications  Medication Sig Dispense Refill  . Albuterol Sulfate (PROAIR RESPICLICK) 122 (90 Base) MCG/ACT AEPB Inhale 2 puffs into the lungs every 6 (six) hours as needed (congestion, cough, wheezing).     Marland Kitchen amLODipine (NORVASC) 5 MG tablet Take 1 tablet (5 mg total) by mouth daily. 30 tablet 0  . apixaban (ELIQUIS) 5 MG TABS tablet Take 5 mg by mouth 2 (two) times daily.    . betamethasone dipropionate (DIPROLENE) 0.05 % cream Apply 1 application topically 2 (two) times daily as needed (psoriasis).     . Cholecalciferol (VITAMIN D3) 1000 UNITS CAPS Take 1,000 Units by mouth daily.     Marland Kitchen escitalopram (LEXAPRO) 20 MG tablet Take 20 mg by mouth daily.    Marland Kitchen esomeprazole (NEXIUM) 40 MG capsule Take 40 mg by mouth daily at 8 pm.     . ferrous sulfate 325 (65 FE) MG tablet Take 325 mg by mouth  daily.     . Fluticasone-Salmeterol (ADVAIR) 250-50 MCG/DOSE AEPB Inhale 1 puff into the lungs 2 (two) times daily.     . folic acid (FOLVITE) 1 MG tablet Take 1 mg by mouth daily at 12 noon.     . furosemide (LASIX) 40 MG tablet Take 40 mg by mouth daily.     Marland Kitchen gabapentin (NEURONTIN) 600 MG tablet Take 600 mg by mouth 3 (three) times daily. 0800, 1600, 0000    . insulin glargine (LANTUS) 100 UNIT/ML injection Inject 80 Units into the skin daily at 12 noon.     . Insulin Glargine-Lixisenatide (SOLIQUA) 100-33 UNT-MCG/ML SOPN Inject 60 Units as directed as directed.    . isosorbide mononitrate (IMDUR) 30 MG 24 hr tablet Take 1 tablet by mouth once daily 90 tablet 1  . losartan (COZAAR) 100 MG tablet Take 50 mg by mouth daily at 3 pm.    . magnesium oxide (MAG-OX) 400 (241.3 Mg) MG tablet Take 1 tablet by mouth daily.    . magnesium oxide (MAG-OX) 400 MG tablet Take 400 mg by mouth daily.    . Melatonin 10 MG TABS Take 10 mg by mouth daily at 8 pm.     . metaxalone (SKELAXIN) 800 MG tablet Take 800 mg by mouth 3 (three) times daily. 0800, 1200, 1600    . methotrexate 2.5 MG tablet Take 5-7.5  tablets by mouth See admin instructions. Take 7.35m by mouth in the morning and 558min the evening once weekly on Wednesday  3  . metoprolol succinate (TOPROL-XL) 50 MG 24 hr tablet Take 50 mg by mouth daily.    . Multiple Vitamin (MULTIVITAMIN WITH MINERALS) TABS tablet Take 1 tablet by mouth daily.    . nitrofurantoin, macrocrystal-monohydrate, (MACROBID) 100 MG capsule Take 100 mg by mouth every evening.    . nitroGLYCERIN (NITROSTAT) 0.4 MG SL tablet Place 1 tablet (0.4 mg total) under the tongue every 5 (five) minutes as needed for chest pain. 25 tablet 3  . nortriptyline (PAMELOR) 10 MG capsule Take 10 mg by mouth at bedtime. (0000)    . Nortriptyline HCl (NORTRIPYTLINE HCL PO) Take 10 mg by mouth.     . Marland Kitchenmeprazole (PRILOSEC) 40 MG capsule Take 40 mg by mouth daily at 8 pm.    . ondansetron (ZOFRAN-ODT)  4 MG disintegrating tablet Take 4 mg by mouth every 8 (eight) hours as needed for nausea or vomiting.     . OXYCONTIN 15 MG 12 hr tablet Take 1 tablet (15 mg total) by mouth every 12 (twelve) hours. 4 tablet 0  . potassium chloride (MICRO-K) 10 MEQ CR capsule Take 10 mEq by mouth 2 (two) times daily.    . potassium chloride SA (K-DUR,KLOR-CON) 20 MEQ tablet Take 10 mEq by mouth 2 (two) times daily. 1200 & 2000    . promethazine (PHENERGAN) 25 MG tablet Take 12.5-25 mg by mouth every 6 (six) hours as needed for nausea.     . Marland KitchenELION INSULIN SYRINGE 1ML/31G 31G X 5/16" 1 ML MISC See admin instructions.    . rosuvastatin (CRESTOR) 5 MG tablet Take 5 mg by mouth daily at 8 pm.     . senna (SENOKOT) 8.6 MG tablet Take 1 tablet by mouth as needed for constipation.     . tapentadol (NUCYNTA) 50 MG tablet Take 1 tablet (50 mg total) by mouth every 6 (six) hours. 6 am, noon, 6pm  And midnight (Patient taking differently: Take 50 mg by mouth every 6 (six) hours. 0800, 1200, 1800, 0000) 8 tablet 0  . vitamin B-12 (CYANOCOBALAMIN) 1000 MCG tablet Take 1,000 mcg by mouth daily.     Current Facility-Administered Medications  Medication Dose Route Frequency Provider Last Rate Last Dose  . ibuprofen (ADVIL) tablet 600 mg  600 mg Oral Q6H PRN ReWallene HuhDPM        On ROS today: 10 system ROS is negative unless otherwise noted in HPI   Physical Examination   Vitals:   09/08/19 1533  BP: (!) 151/71  Pulse: 83  Resp: 16  Temp: (!) 97.3 F (36.3 C)  TempSrc: Temporal  SpO2: 98%  Weight: 200 lb (90.7 kg)  Height: 5' 6.5" (1.689 m)   Body mass index is 31.8 kg/m.  General Alert, O x 3, WD, NAD  Pulmonary Sym exp, good B air movt, CTA B  Cardiac RRR, Nl S1, S2  Vascular Vessel Right Left  Radial Palpable Palpable  Aorta Not palpable N/A  Femoral Not palpable Faintly palpable  Popliteal Not palpable Not palpable  PT Not palpable Not palpable  DP Not palpable Not palpable    Gastro-  intestinal soft, non-distended, non-tender to palpation,   Musculo- skeletal M/S 5/5 throughout  , dependent rubor, dusky appearing toes R foot  Neurologic Cranial nerves 2-12 intact , Pain and light touch intact in extremities , Motor exam as  listed above    Non-Invasive Vascular Imaging   Arterial duplex demonstrating monophasic flow throughout bilateral lower extremities.  Dampened waveforms without unattainable ABI right leg.  Left ABI 0.47   Medical Decision Making   ARIYA BOHANNON is a 68 y.o. female who presents with: right lower extremity critical limb ischemia with rest pain and early tissue changes   Unobtainable ABI right lower extremity with only dampened waveforms at the level of the ankle  Patient was unable to lie flat for more than 10 minutes at a time during her duplex study today.  Thus, plan will be for aortogram with possible right lower extremity intervention by Dr. Donzetta Matters in the operating room under local and MAC on 09/15/2019  Given patient's immobility and past medical history including severe COPD, cor pulmonale, CAD she is likely not a surgical candidate for revascularization.  Patient is aware this is a limb threatening situation which may result in amputation if unable to improve blood flow via endovascular procedure  We will hold her Eliquis 48 hours prior to procedure  Patient and husband or explained the risks and benefits of the procedure and agreed to proceed   Dagoberto Ligas PA-C [facility] Office: [phone]  Clinic MD: Dr. Oneida Alar

## 2019-09-12 ENCOUNTER — Other Ambulatory Visit (HOSPITAL_COMMUNITY)
Admission: RE | Admit: 2019-09-12 | Discharge: 2019-09-12 | Disposition: A | Discharge status: Home / Self Care | Payer: Medicare Other | Source: Ambulatory Visit | Attending: Vascular Surgery | Admitting: Vascular Surgery

## 2019-09-12 DIAGNOSIS — Z01812 Encounter for preprocedural laboratory examination: Secondary | ICD-10-CM | POA: Insufficient documentation

## 2019-09-12 DIAGNOSIS — Z20828 Contact with and (suspected) exposure to other viral communicable diseases: Secondary | ICD-10-CM | POA: Diagnosis not present

## 2019-09-12 NOTE — Progress Notes (Addendum)
[place], [place] [address] Phone: [phone] Fax: [phone]      Your procedure is scheduled on 09/15/2019.  Report to [facility] Main Entrance "A" at 5:30 A.M., and check in at the Admitting office.  Call this number if you have problems the morning of surgery:  [phone]  Call [phone] if you have any questions prior to your surgery date Monday-Friday 8am-4pm    Remember:  Do not eat or drink after midnight the night before your surgery   Take these medicines the morning of surgery with A SIP OF WATER:  amLODipine (NORVASC) escitalopram (LEXAPRO) Fluticasone-Salmeterol (ADVAIR) gabapentin (NEURONTIN) metaxalone (SKELAXIN) metoprolol succinate (TOPROL-XL)   Take these medicines if needed:  Albuterol Sulfate (PROAIR RESPICLICK)  betamethasone dipropionate (DIPROLENE) nitroGLYCERIN (NITROSTAT) ondansetron (ZOFRAN-ODT) OXYCONTIN    WHAT DO I DO ABOUT MY DIABETES MEDICATION?   Marland Kitchen Do not take oral diabetes medicines (pills) the morning of surgery.  . The day of surgery, do not take other diabetes injectables, including Byetta (exenatide), Bydureon (exenatide ER), Victoza (liraglutide), or Trulicity (dulaglutide).  . If your CBG is greater than 220 mg/dL, you may take  of your sliding scale (correction) dose of insulin.   How to Manage Your Diabetes Before and After Surgery  Why is it important to control my blood sugar before and after surgery? . Improving blood sugar levels before and after surgery helps healing and can limit problems. . A way of improving blood sugar control is eating a healthy diet by: o  Eating less sugar and carbohydrates o  Increasing activity/exercise o  Talking with your doctor about reaching your blood sugar goals . High blood sugars (greater than 180 mg/dL) can raise your risk of infections and slow your recovery, so you will need to  focus on controlling your diabetes during the weeks before surgery. . Make sure that the doctor who takes care of your diabetes knows about your planned surgery including the date and location.  How do I manage my blood sugar before surgery? . Check your blood sugar at least 4 times a day, starting 2 days before surgery, to make sure that the level is not too high or low. o Check your blood sugar the morning of your surgery when you wake up and every 2 hours until you get to the Short Stay unit. . If your blood sugar is less than 70 mg/dL, you will need to treat for low blood sugar: o Do not take insulin. o Treat a low blood sugar (less than 70 mg/dL) with  cup of clear juice (cranberry or apple), 4 glucose tablets, OR glucose gel. o Recheck blood sugar in 15 minutes after treatment (to make sure it is greater than 70 mg/dL). If your blood sugar is not greater than 70 mg/dL on recheck, call [phone] for further instructions. . Report your blood sugar to the short stay nurse when you get to Short Stay.  . If you are admitted to the hospital after surgery: o Your blood sugar will be checked by the staff and you will probably be given insulin after surgery (instead of oral diabetes medicines) to make sure you have good blood sugar levels. o The goal for blood sugar control after surgery is 80-180 mg/dL.   As of today, STOP taking any Aspirin (unless otherwise instructed by your surgeon), Aleve, Naproxen, Ibuprofen, Motrin, Advil, Goody's, BC's, all herbal medications, fish oil, and all  vitamins.    The Morning of Surgery  Do not wear jewelry, make-up or nail polish.  Do not wear lotions, powders, or perfumes or deodorant  Do not shave 48 hours prior to surgery.   Do not bring valuables to the hospital.  [facility] is not responsible for any belongings or valuables.  If you are a smoker, DO NOT Smoke 24 hours prior to surgery IF you wear a CPAP at night please bring your mask, tubing,  and machine the morning of surgery   Remember that you must have someone to transport you home after your surgery, and remain with you for 24 hours if you are discharged the same day.   Contacts, glasses, hearing aids, dentures or bridgework may not be worn into surgery.    Leave your suitcase in the car.  After surgery it may be brought to your room.  For patients admitted to the hospital, discharge time will be determined by your treatment team.  Patients discharged the day of surgery will not be allowed to drive home.    Special instructions:   [place]- Preparing For Surgery  Before surgery, you can play an important role. Because skin is not sterile, your skin needs to be as free of germs as possible. You can reduce the number of germs on your skin by washing with CHG (chlorahexidine gluconate) Soap before surgery.  CHG is an antiseptic cleaner which kills germs and bonds with the skin to continue killing germs even after washing.    Oral Hygiene is also important to reduce your risk of infection.  Remember - BRUSH YOUR TEETH THE MORNING OF SURGERY WITH YOUR REGULAR TOOTHPASTE  Please do not use if you have an allergy to CHG or antibacterial soaps. If your skin becomes reddened/irritated stop using the CHG.  Do not shave (including legs and underarms) for at least 48 hours prior to first CHG shower. It is OK to shave your face.  Please follow these instructions carefully.   1. Shower the NIGHT BEFORE SURGERY and the MORNING OF SURGERY with CHG Soap.   2. If you chose to wash your hair, wash your hair first as usual with your normal shampoo.  3. After you shampoo, rinse your hair and body thoroughly to remove the shampoo.  4. Use CHG as you would any other liquid soap. You can apply CHG directly to the skin and wash gently with a scrungie or a clean washcloth.   5. Apply the CHG Soap to your body ONLY FROM THE NECK DOWN.  Do not use on open wounds or open sores. Avoid  contact with your eyes, ears, mouth and genitals (private parts). Wash Face and genitals (private parts)  with your normal soap.   6. Wash thoroughly, paying special attention to the area where your surgery will be performed.  7. Thoroughly rinse your body with warm water from the neck down.  8. DO NOT shower/wash with your normal soap after using and rinsing off the CHG Soap.  9. Pat yourself dry with a CLEAN TOWEL.  10. Wear CLEAN PAJAMAS to bed the night before surgery, wear comfortable clothes the morning of surgery  11. Place CLEAN SHEETS on your bed the night of your first shower and DO NOT SLEEP WITH PETS.    Day of Surgery:  Do not apply any deodorants/lotions. Please shower the morning of surgery with the CHG soap  Please wear clean clothes to the hospital/surgery center.   Remember to brush  your teeth WITH YOUR REGULAR TOOTHPASTE.   Please read over the following fact sheets that you were given.

## 2019-09-13 ENCOUNTER — Other Ambulatory Visit: Payer: Self-pay

## 2019-09-13 ENCOUNTER — Encounter (HOSPITAL_COMMUNITY)
Admission: RE | Admit: 2019-09-13 | Discharge: 2019-09-13 | Disposition: A | Discharge status: Home / Self Care | Payer: Medicare Other | Source: Ambulatory Visit | Attending: Vascular Surgery | Admitting: Vascular Surgery

## 2019-09-13 ENCOUNTER — Encounter (HOSPITAL_COMMUNITY): Payer: Self-pay

## 2019-09-13 DIAGNOSIS — E119 Type 2 diabetes mellitus without complications: Secondary | ICD-10-CM | POA: Diagnosis not present

## 2019-09-13 DIAGNOSIS — Z01812 Encounter for preprocedural laboratory examination: Secondary | ICD-10-CM | POA: Diagnosis not present

## 2019-09-13 HISTORY — DX: Acute embolism and thrombosis of unspecified deep veins of unspecified lower extremity: I82.409

## 2019-09-13 LAB — BASIC METABOLIC PANEL
Anion gap: 11 (ref 5–15)
BUN: 21 mg/dL (ref 8–23)
CO2: 29 mmol/L (ref 22–32)
Calcium: 9 mg/dL (ref 8.9–10.3)
Chloride: 92 mmol/L — ABNORMAL LOW (ref 98–111)
Creatinine, Ser: 1.26 mg/dL — ABNORMAL HIGH (ref 0.44–1.00)
GFR calc Af Amer: 50 mL/min — ABNORMAL LOW (ref >60)
GFR calc non Af Amer: 43 mL/min — ABNORMAL LOW (ref >60)
Glucose, Bld: 226 mg/dL — ABNORMAL HIGH (ref 70–99)
Potassium: 4.3 mmol/L (ref 3.5–5.1)
Sodium: 132 mmol/L — ABNORMAL LOW (ref 135–145)

## 2019-09-13 LAB — CBC
HCT: 35 % — ABNORMAL LOW (ref 36.0–46.0)
Hemoglobin: 10.4 g/dL — ABNORMAL LOW (ref 12.0–15.0)
MCH: 27.6 pg (ref 26.0–34.0)
MCHC: 29.7 g/dL — ABNORMAL LOW (ref 30.0–36.0)
MCV: 92.8 fL (ref 80.0–100.0)
Platelets: 428 10*3/uL — ABNORMAL HIGH (ref 150–400)
RBC: 3.77 MIL/uL — ABNORMAL LOW (ref 3.87–5.11)
RDW: 17.4 % — ABNORMAL HIGH (ref 11.5–15.5)
WBC: 12.4 10*3/uL — ABNORMAL HIGH (ref 4.0–10.5)
nRBC: 0 % (ref 0.0–0.2)

## 2019-09-13 LAB — SURGICAL PCR SCREEN
MRSA, PCR: NEGATIVE
Staphylococcus aureus: NEGATIVE

## 2019-09-13 LAB — NOVEL CORONAVIRUS, NAA (HOSP ORDER, SEND-OUT TO REF LAB; TAT 18-24 HRS): SARS-CoV-2, NAA: NOT DETECTED

## 2019-09-13 LAB — GLUCOSE, CAPILLARY: Glucose-Capillary: 222 mg/dL — ABNORMAL HIGH (ref 70–99)

## 2019-09-13 NOTE — Progress Notes (Signed)
PCP:  Dr. [name] Cardiologist:  Dr. Dani Gobble Croitoru  EKG:  07/08/19 CXR:  N/A ECHO:  08/22/17 Stress Test:  2016 Cardiac Cath:  Denies  Fasting Blood Sugar- 150-250 Checks Blood Sugar__3_ times a day  Eliquis-last dose 09/11/19  Anesthesia Review:  Cardiac history  Patient denies shortness of breath, fever, cough, and chest pain at PAT appointment.  Patient verbalized understanding of instructions provided today at the PAT appointment.  Patient asked to review instructions at home and day of surgery.

## 2019-09-14 ENCOUNTER — Encounter (HOSPITAL_COMMUNITY): Payer: Self-pay

## 2019-09-14 LAB — HEMOGLOBIN A1C
Hgb A1c MFr Bld: 9.4 % — ABNORMAL HIGH (ref 4.8–5.6)
Mean Plasma Glucose: 223 mg/dL

## 2019-09-14 NOTE — Anesthesia Preprocedure Evaluation (Addendum)
Anesthesia Evaluation  Patient identified by MRN, date of birth, ID band Patient awake    Reviewed: Allergy & Precautions, NPO status , Patient's Chart, lab work & pertinent test results  History of Anesthesia Complications Negative for: history of anesthetic complications  Airway Mallampati: II  TM Distance: >3 FB Neck ROM: Full    Dental  (+) Dental Advisory Given   Pulmonary shortness of breath, COPD, neg recent URI, former smoker,    breath sounds clear to auscultation       Cardiovascular hypertension, Pt. on medications and Pt. on home beta blockers + CAD, + Peripheral Vascular Disease, +CHF and + DOE   Rhythm:Regular     Neuro/Psych PSYCHIATRIC DISORDERS Anxiety Depression TIA Neuromuscular disease CVA    GI/Hepatic Neg liver ROS, GERD  Medicated and Controlled,  Endo/Other  diabetes, Insulin Dependent  Renal/GU Renal InsufficiencyRenal disease     Musculoskeletal  (+) Arthritis ,   Abdominal   Peds  Hematology  (+) anemia ,   Anesthesia Other Findings DISCUSSION: Patient is a 68 year old female scheduled for the above procedure.  History includes former smoker (quit 01/29/17), COPD, pulmonary nodules (since 2016). CAD (coronary calcifications on CT), chronic diastolic CHF, PVD (s/p left SFA stent 06/28/18), right femoral-popliteal DVT (05/03/19), HTN, DM2, carotid artery disease (s/p right CEA 08/17/15), GERD, HLD, psoriasis, TIA (2015), endometrial cancer (s/p TAH/BSO 05/02/08 s/p chemoradiation with recurrent 2011 s/p radiation to periaortic region and L3-4 disc space), chemo-induced neuropathy, anxiety, anemia, cecal AVM, elevated LFTs, hydronephrosis, thyroid nodule (right, stable by 08/17/17 [place], biopsy 89/16/94: scant follicular epithelium), left foot drop, back surgery, psoriasis.  Last seen by cardiologist Dr. Sallyanne Kuster on 07/08/19. CHF stable. His note also mentions pulmonary hypertension (PAH 36 mmHg 2017; normal  PA systolic pressure and normal RV systolic function on 5038 echo). CAD by 2018 CT. No angina. Medical management continued (as not a candidate for CABG, and has been felt to be a poor candidate for PCI in the past due to GI bleeding). One year follow-up recommended.  A1c 9.4%. Reports home CBGs ~ 150-250. A1c results routed to Dr. Donzetta Matters and [facility] RNs Zigmund Daniel and [place].  Reproductive/Obstetrics                            Anesthesia Physical Anesthesia Plan  ASA: III  Anesthesia Plan: MAC   Post-op Pain Management:    Induction: Intravenous  PONV Risk Score and Plan: 2 and Treatment may vary due to age or medical condition, Propofol infusion and Ondansetron  Airway Management Planned: Nasal Cannula  Additional Equipment:   Intra-op Plan:   Post-operative Plan:   Informed Consent: I have reviewed the patients History and Physical, chart, labs and discussed the procedure including the risks, benefits and alternatives for the proposed anesthesia with the patient or authorized representative who has indicated his/her understanding and acceptance.     Dental advisory given  Plan Discussed with: CRNA and Surgeon  Anesthesia Plan Comments: (PAT note written 09/14/2019 by Myra Gianotti, PA-C. )       Anesthesia Quick Evaluation

## 2019-09-14 NOTE — Progress Notes (Signed)
Anesthesia Chart Review:  Case: [id number] Date/Time: 09/15/19 0715   Procedure: AORTOGRAM BILATERAL LOWER RUNOFF AND POSSIBLE INTERVENTIONS (N/A )   Anesthesia type: Monitor Anesthesia Care   Pre-op diagnosis: claudication   Location: [place] OR ROOM 16 / [place] OR   Surgeon: [name], MD      DISCUSSION: Patient is a 68 year old female scheduled for the above procedure.  History includes former smoker (quit 01/29/17), COPD, pulmonary nodules (since 2016). CAD (coronary calcifications on CT), chronic diastolic CHF, PVD (s/p left SFA stent 06/28/18), right femoral-popliteal DVT (05/03/19), HTN, DM2, carotid artery disease (s/p right CEA 08/17/15), GERD, HLD, psoriasis, TIA (2015), endometrial cancer (s/p TAH/BSO 05/02/08 s/p chemoradiation with recurrent 2011 s/p radiation to periaortic region and L3-4 disc space), chemo-induced neuropathy, anxiety, anemia, cecal AVM, elevated LFTs, hydronephrosis, thyroid nodule (right, stable by 08/17/17 [place], biopsy 123XX123: scant follicular epithelium), left foot drop, back surgery, psoriasis.  Last seen by cardiologist Dr. Sallyanne Kuster on 07/08/19. CHF stable. His note also mentions pulmonary hypertension (PAH 36 mmHg 2017; normal PA systolic pressure and normal RV systolic function on [id number] echo). CAD by 2018 CT. No angina. Medical management continued (as not a candidate for CABG, and has been felt to be a poor candidate for PCI in the past due to GI bleeding). One year follow-up recommended.  A1c 9.4%. Reports home CBGs ~ 150-250. A1c results routed to Dr. Donzetta Matters and VVS RNs Zigmund Daniel and [place].  Last Eliquis 09/11/19.  09/12/19 COVID-19 test negative. Anesthesia team to evaluate on the day of surgery.   VS: BP (!) 120/56   Pulse 92   Temp 36.8 C   Resp 18   Ht 5' 6.5" (1.689 m)   Wt 90.7 kg   SpO2 98%   BMI 31.80 kg/m    PROVIDERS: [name], MD is PCP - Croitoru, Dani Gobble, MD is cardiologist - Kathlene Cote, MD [facility] Last visit 07/04/19. If no improvement in her DM control at next follow-up, he will likely change her to 70/30 insulin. - Pain Management with [facility] in [place] ([place]). Christinia Gully, MD is pulmonologist. Last visit 01/07/18. He wrote that although severe COPD present, it may not be clinically relevant. Symptoms not limiting her current activity, although relatively sedentary. Unless issues with frequent exacerbations, he did not recommend escalating her pulmonary regimen at that time. Patient to contact his office if future issues arise.   LABS: Preoperative labs noted. A1c 9.4%, consistent with overage glucose of 223.   (all labs ordered are listed, but only abnormal results are displayed)  Labs Reviewed  GLUCOSE, CAPILLARY - Abnormal; Notable for the following components:      Result Value   Glucose-Capillary 222 (*)    All other components within normal limits  BASIC METABOLIC PANEL - Abnormal; Notable for the following components:   Sodium 132 (*)    Chloride 92 (*)    Glucose, Bld 226 (*)    Creatinine, Ser 1.26 (*)    GFR calc non Af Amer 43 (*)    GFR calc Af Amer 50 (*)    All other components within normal limits  CBC - Abnormal; Notable for the following components:   WBC 12.4 (*)    RBC 3.77 (*)    Hemoglobin 10.4 (*)    HCT 35.0 (*)    MCHC 29.7 (*)    RDW 17.4 (*)    Platelets 428 (*)  All other components within normal limits  HEMOGLOBIN A1C - Abnormal; Notable for the following components:   Hgb A1c MFr Bld 9.4 (*)    All other components within normal limits  SURGICAL PCR SCREEN    Spirometry 07/30/17: FVC 1.5 (43%), FEV1 0.8 (30%), FEV1/FVC 53% (69%), FEF 25-75% 0.3 (14%). Very severe obstruction, low vital capacity.    EKG: 07/08/19: NSR   CV: BLE venous US 05/03/19: Summary: Right: Findings consistent with acute deep vein thrombosis involving the right femoral vein, and right popliteal vein.  Portions of this examination were limited- see technologist comments above. Left: Portions of this examination were limited- see technologist comments above. There is no evidence of deep vein thrombosis in the lower extremity. However, portions of this examination were limited- see technologist comments above.   Carotid US 04/30/18: Final Interpretation: Right Carotid: Patent right carotid endarterectomy with no evidence of                restenosis. Left Carotid: Velocities in the left ICA are consistent with a 1-39% stenosis. Vertebrals:  Bilateral vertebral arteries demonstrate antegrade flow. Subclavians: Normal flow hemodynamics were seen in bilateral subclavian              arteries.   Echo 08/22/17: Study Conclusions - Left ventricle: The cavity size was normal. Systolic function was   normal. The estimated ejection fraction was in the range of 55%   to 60%. Wall motion was normal; there were no regional wall   motion abnormalities. Features are consistent with a pseudonormal   left ventricular filling pattern, with concomitant abnormal   relaxation and increased filling pressure (grade 2 diastolic   dysfunction). - Right ventricle: The cavity size was normal. Wall thickness was    normal. Systolic function was normal. - Mitral valve: There was trivial regurgitation. - Pulmonary artery:   The main pulmonary artery was normal-sized.   Systolic pressure was within the normal range. Impressions: - Normal LVF with grade 2 DD and trivial MR. Compared to prior   echo, PASP is nor normal.   Nuclear Stress test 08/10/15:  The left ventricular ejection fraction is mildly decreased (45-54%).  Nuclear stress EF: 49%.  There was no ST segment deviation noted during stress.  Defect 1: There is a small fixed defect of mild severity present in the basal anterior and basal anteroseptal location.  This is a low risk study. Low risk stress nuclear study with mild breast attenuation  artifact and mildly depressed global systolic function. A prior study was conducted on 06/16/2013.Compared to the prior study, there are changes.   Past Medical History:  Diagnosis Date  . Adenomatous colon polyp   . Anemia   . Anxiety   . AVM (arteriovenous malformation)    cecum  . CAD (coronary artery disease)   . Carotid artery occlusion   . Cataract    bil cateracts removed  . CHF (congestive heart failure) ([place])   . COPD (chronic obstructive pulmonary disease) ([place])   . Cough   . DDD (degenerative disc disease), lumbar   . Depression   . Diabetes mellitus    Type II  . Elevated LFTs   . Endometrial ca [facility])    endometrial ca dx 4/11  . Foot drop   . GERD (gastroesophageal reflux disease)   . Gout 2015  . Hernia of flank   . Hydronephrosis   . Hyperlipidemia   . Hypertension   . Insomnia   . Neuropathy   .  OAB (overactive bladder)   . Osteoarthritis   . Peripheral neuropathy    following chemotherapy  . Peripheral vascular disease ([place])   . Pneumonia 07/2017  . Psoriasis   . Pulmonary nodule    since 2016  . Radiation 05/01/2011-06/11/11   5600 cGy 28 fxs/periaortic  . Stroke [facility])    tia- Patient denies  . Thyroid nodule   . TIA (transient ischemic attack) 2015  . 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 Uterine cancer ([place])   . Ventral hernia    postoperative since 2009    Past Surgical History:  Procedure Laterality Date  . ABDOMINAL AORTOGRAM W/LOWER EXTREMITY Right 06/28/2018   Procedure: ABDOMINAL AORTOGRAM W/LOWER EXTREMITY;  Surgeon: [name], MD;  Location: [facility] [place] CV LAB;  Service: Cardiovascular;  Laterality: Right;  . ABDOMINAL HYSTERECTOMY  2009   for ca  . ACHILLES TENDON LENGTHENING Right 01/28/2019   Procedure: RIGHT ACHILLES TENDON LENGTHENING;  Surgeon: Newt Minion, MD;  Location: [facility];  Service: Orthopedics;  Laterality: Right;  . arterial duplex stent - SFA Left   . BACK SURGERY   Aug/sept 2014   3 back surgeries  (Aug 2014 & Sept 2014)  . BREAST BIOPSY Left over 10 years ago   benign  . CHOLECYSTECTOMY     biliteral  . COLONOSCOPY  11/13/2014   negative except for a small cecal AVM and 1 polyp  . ENDARTERECTOMY Right 08/17/2015   Procedure: Right Carotid Endartarectomy with patch Angioplasty ;  Surgeon: Mal Misty, MD;  Location: [place];  Service: Vascular;  Laterality: Right;  . EYE SURGERY Right Aug. 2013   Cataract, lens implant  . EYE SURGERY Left Sept. 2013   Cataract, lens implant  . I & D of lumbar fusion /removal of hardware for chronic infection  Jan. 8, 2015  . SPINE SURGERY      MEDICATIONS: . Albuterol Sulfate (PROAIR RESPICLICK) 123XX123 (90 Base) MCG/ACT AEPB  . amLODipine (NORVASC) 5 MG tablet  . apixaban (ELIQUIS) 5 MG TABS tablet  . betamethasone dipropionate (DIPROLENE) 0.05 % ointment  . Cholecalciferol (VITAMIN D3) 1000 UNITS CAPS  . escitalopram (LEXAPRO) 20 MG tablet  . esomeprazole (NEXIUM) 40 MG capsule  . ferrous sulfate 325 (65 FE) MG tablet  . Fluticasone-Salmeterol (ADVAIR) 250-50 MCG/DOSE AEPB  . folic acid (FOLVITE) Q000111Q MCG tablet  . furosemide (LASIX) 40 MG tablet  . gabapentin (NEURONTIN) 600 MG tablet  . insulin glargine (LANTUS) 100 UNIT/ML injection  . Insulin Glargine-Lixisenatide (SOLIQUA) 100-33 UNT-MCG/ML SOPN  . isosorbide mononitrate (IMDUR) 30 MG 24 hr tablet  . losartan (COZAAR) 100 MG tablet  . magnesium oxide (MAG-OX) 400 MG tablet  . Melatonin 10 MG TABS  . metaxalone (SKELAXIN) 800 MG tablet  . methotrexate 2.5 MG tablet  . metoprolol succinate (TOPROL-XL) 50 MG 24 hr tablet  . Multiple Vitamin (MULTIVITAMIN WITH MINERALS) TABS tablet  . nitrofurantoin, macrocrystal-monohydrate, (MACROBID) 100 MG capsule  . nitroGLYCERIN (NITROSTAT) 0.4 MG SL tablet  . nortriptyline (PAMELOR) 10 MG capsule  . ondansetron (ZOFRAN-ODT) 4 MG disintegrating tablet  . OXYCONTIN 15 MG 12 hr tablet  . potassium chloride  (MICRO-K) 10 MEQ CR capsule  . RELION INSULIN SYRINGE 1ML/31G 31G X 5/16" 1 ML MISC  . rosuvastatin (CRESTOR) 5 MG tablet  . senna (SENOKOT) 8.6 MG tablet  . tapentadol (NUCYNTA) 50 MG tablet  . vitamin B-12 (CYANOCOBALAMIN) 1000 MCG tablet   . ibuprofen (  ADVIL) tablet 600 mg    Myra Gianotti, PA-C Surgical Short Stay/Anesthesiology [facility] Phone [phone] [facility] Phone [phone] 09/14/2019 10:08 AM

## 2019-09-15 ENCOUNTER — Other Ambulatory Visit: Payer: Self-pay | Admitting: *Deleted

## 2019-09-15 ENCOUNTER — Ambulatory Visit (HOSPITAL_COMMUNITY): Payer: Medicare Other | Admitting: Certified Registered Nurse Anesthetist

## 2019-09-15 ENCOUNTER — Other Ambulatory Visit: Payer: Self-pay

## 2019-09-15 ENCOUNTER — Encounter (HOSPITAL_COMMUNITY): Payer: Self-pay

## 2019-09-15 ENCOUNTER — Encounter (HOSPITAL_COMMUNITY)
Admission: RE | Disposition: A | Discharge status: Home / Self Care | Payer: Self-pay | Source: Home / Self Care | Attending: Vascular Surgery

## 2019-09-15 ENCOUNTER — Ambulatory Visit (HOSPITAL_COMMUNITY): Payer: Medicare Other | Admitting: Vascular Surgery

## 2019-09-15 ENCOUNTER — Ambulatory Visit (HOSPITAL_COMMUNITY): Payer: Medicare Other

## 2019-09-15 ENCOUNTER — Ambulatory Visit (HOSPITAL_COMMUNITY)
Admission: RE | Admit: 2019-09-15 | Discharge: 2019-09-15 | Disposition: A | Discharge status: Home / Self Care | Payer: Medicare Other | Attending: Vascular Surgery | Admitting: Vascular Surgery

## 2019-09-15 DIAGNOSIS — E1151 Type 2 diabetes mellitus with diabetic peripheral angiopathy without gangrene: Secondary | ICD-10-CM | POA: Diagnosis not present

## 2019-09-15 DIAGNOSIS — Z79899 Other long term (current) drug therapy: Secondary | ICD-10-CM | POA: Insufficient documentation

## 2019-09-15 DIAGNOSIS — I70221 Atherosclerosis of native arteries of extremities with rest pain, right leg: Secondary | ICD-10-CM | POA: Insufficient documentation

## 2019-09-15 DIAGNOSIS — E785 Hyperlipidemia, unspecified: Secondary | ICD-10-CM | POA: Insufficient documentation

## 2019-09-15 DIAGNOSIS — Z794 Long term (current) use of insulin: Secondary | ICD-10-CM | POA: Insufficient documentation

## 2019-09-15 DIAGNOSIS — I2781 Cor pulmonale (chronic): Secondary | ICD-10-CM | POA: Diagnosis not present

## 2019-09-15 DIAGNOSIS — I2510 Atherosclerotic heart disease of native coronary artery without angina pectoris: Secondary | ICD-10-CM | POA: Insufficient documentation

## 2019-09-15 DIAGNOSIS — I998 Other disorder of circulatory system: Secondary | ICD-10-CM

## 2019-09-15 DIAGNOSIS — Z7901 Long term (current) use of anticoagulants: Secondary | ICD-10-CM | POA: Insufficient documentation

## 2019-09-15 DIAGNOSIS — I10 Essential (primary) hypertension: Secondary | ICD-10-CM | POA: Diagnosis not present

## 2019-09-15 DIAGNOSIS — Z86718 Personal history of other venous thrombosis and embolism: Secondary | ICD-10-CM | POA: Insufficient documentation

## 2019-09-15 DIAGNOSIS — J449 Chronic obstructive pulmonary disease, unspecified: Secondary | ICD-10-CM | POA: Diagnosis not present

## 2019-09-15 HISTORY — PX: LOWER EXTREMITY ANGIOGRAM: SHX5508

## 2019-09-15 HISTORY — PX: ULTRASOUND GUIDANCE FOR VASCULAR ACCESS: SHX6516

## 2019-09-15 HISTORY — PX: AORTOGRAM: SHX6300

## 2019-09-15 LAB — PROTIME-INR
INR: 1.1 (ref 0.8–1.2)
Prothrombin Time: 13.9 seconds (ref 11.4–15.2)

## 2019-09-15 LAB — POCT I-STAT, CHEM 8
BUN: 21 mg/dL (ref 8–23)
Calcium, Ion: 1.11 mmol/L — ABNORMAL LOW (ref 1.15–1.40)
Chloride: 96 mmol/L — ABNORMAL LOW (ref 98–111)
Creatinine, Ser: 1.2 mg/dL — ABNORMAL HIGH (ref 0.44–1.00)
Glucose, Bld: 235 mg/dL — ABNORMAL HIGH (ref 70–99)
HCT: 33 % — ABNORMAL LOW (ref 36.0–46.0)
Hemoglobin: 11.2 g/dL — ABNORMAL LOW (ref 12.0–15.0)
Potassium: 4.2 mmol/L (ref 3.5–5.1)
Sodium: 136 mmol/L (ref 135–145)
TCO2: 26 mmol/L (ref 22–32)

## 2019-09-15 LAB — GLUCOSE, CAPILLARY
Glucose-Capillary: 219 mg/dL — ABNORMAL HIGH (ref 70–99)
Glucose-Capillary: 216 mg/dL — ABNORMAL HIGH (ref 70–99)

## 2019-09-15 SURGERY — AORTOGRAM
Anesthesia: Monitor Anesthesia Care | Site: Leg Lower | Laterality: Right

## 2019-09-15 MED ORDER — OXYCODONE HCL 5 MG/5ML PO SOLN: 5.0000 mg | Freq: Once | ORAL | Status: DC | PRN

## 2019-09-15 MED ORDER — LIDOCAINE 2% (20 MG/ML) 5 ML SYRINGE
INTRAMUSCULAR | Status: AC
  Filled 2019-09-15: qty 5

## 2019-09-15 MED ORDER — MIDAZOLAM HCL 2 MG/2ML IJ SOLN
INTRAMUSCULAR | Status: AC
  Filled 2019-09-15: qty 2

## 2019-09-15 MED ORDER — LIDOCAINE HCL (PF) 1 % IJ SOLN
INTRAMUSCULAR | Status: AC
  Filled 2019-09-15: qty 30

## 2019-09-15 MED ORDER — ACETAMINOPHEN 500 MG PO TABS: 1000.0000 mg | ORAL_TABLET | Freq: Once | ORAL | Status: DC | PRN

## 2019-09-15 MED ORDER — LIDOCAINE 2% (20 MG/ML) 5 ML SYRINGE
INTRAMUSCULAR | Status: DC | PRN
  Administered 2019-09-15: 40 mg via INTRAVENOUS

## 2019-09-15 MED ORDER — PHENYLEPHRINE 40 MCG/ML (10ML) SYRINGE FOR IV PUSH (FOR BLOOD PRESSURE SUPPORT)
PREFILLED_SYRINGE | INTRAVENOUS | Status: AC
  Filled 2019-09-15: qty 10

## 2019-09-15 MED ORDER — SODIUM CHLORIDE 0.9 % IV SOLN
INTRAVENOUS | Status: AC
  Filled 2019-09-15: qty 1.2

## 2019-09-15 MED ORDER — ACETAMINOPHEN 160 MG/5ML PO SOLN: 1000.0000 mg | Freq: Once | ORAL | Status: DC | PRN

## 2019-09-15 MED ORDER — SODIUM CHLORIDE 0.9 % IV SOLN: INTRAVENOUS | Status: DC

## 2019-09-15 MED ORDER — SODIUM CHLORIDE 0.9 % IV SOLN
INTRAVENOUS | Status: DC | PRN
  Administered 2019-09-15: 25 ug/min via INTRAVENOUS

## 2019-09-15 MED ORDER — SODIUM CHLORIDE 0.9 % IV SOLN
INTRAVENOUS | Status: DC | PRN
  Administered 2019-09-15: 500 mL

## 2019-09-15 MED ORDER — CEFAZOLIN SODIUM-DEXTROSE 2-4 GM/100ML-% IV SOLN
INTRAVENOUS | Status: AC
  Filled 2019-09-15: qty 100

## 2019-09-15 MED ORDER — LACTATED RINGERS IV SOLN
INTRAVENOUS | Status: DC | PRN
  Administered 2019-09-15: 07:00:00 via INTRAVENOUS

## 2019-09-15 MED ORDER — ONDANSETRON HCL 4 MG/2ML IJ SOLN
INTRAMUSCULAR | Status: AC
  Filled 2019-09-15: qty 2

## 2019-09-15 MED ORDER — IODIXANOL 320 MG/ML IV SOLN
INTRAVENOUS | Status: DC | PRN
  Administered 2019-09-15: 50 mL via INTRAVENOUS

## 2019-09-15 MED ORDER — LIDOCAINE HCL 1 % IJ SOLN
INTRAMUSCULAR | Status: AC
  Filled 2019-09-15: qty 40

## 2019-09-15 MED ORDER — PROPOFOL 10 MG/ML IV BOLUS
INTRAVENOUS | Status: AC
  Filled 2019-09-15: qty 20

## 2019-09-15 MED ORDER — FENTANYL CITRATE (PF) 100 MCG/2ML IJ SOLN
INTRAMUSCULAR | Status: DC | PRN
  Administered 2019-09-15: 50 ug via INTRAVENOUS

## 2019-09-15 MED ORDER — FENTANYL CITRATE (PF) 100 MCG/2ML IJ SOLN: 25.0000 ug | INTRAMUSCULAR | Status: DC | PRN

## 2019-09-15 MED ORDER — CHLORHEXIDINE GLUCONATE CLOTH 2 % EX PADS: 6.0000 | MEDICATED_PAD | Freq: Once | CUTANEOUS | Status: DC

## 2019-09-15 MED ORDER — FENTANYL CITRATE (PF) 250 MCG/5ML IJ SOLN
INTRAMUSCULAR | Status: AC
  Filled 2019-09-15: qty 5

## 2019-09-15 MED ORDER — LIDOCAINE HCL (PF) 1 % IJ SOLN
INTRAMUSCULAR | Status: DC | PRN
  Administered 2019-09-15: 10 mL via INTRADERMAL

## 2019-09-15 MED ORDER — ACETAMINOPHEN 10 MG/ML IV SOLN: 1000.0000 mg | Freq: Once | INTRAVENOUS | Status: DC | PRN

## 2019-09-15 MED ORDER — PROPOFOL 500 MG/50ML IV EMUL
INTRAVENOUS | Status: DC | PRN
  Administered 2019-09-15: 75 ug/kg/min via INTRAVENOUS

## 2019-09-15 MED ORDER — PHENYLEPHRINE 40 MCG/ML (10ML) SYRINGE FOR IV PUSH (FOR BLOOD PRESSURE SUPPORT)
PREFILLED_SYRINGE | INTRAVENOUS | Status: DC | PRN
  Administered 2019-09-15 (×4): 80 ug via INTRAVENOUS

## 2019-09-15 MED ORDER — CEFAZOLIN SODIUM-DEXTROSE 2-4 GM/100ML-% IV SOLN
2.0000 g | INTRAVENOUS | Status: AC
  Administered 2019-09-15: 2 g via INTRAVENOUS

## 2019-09-15 MED ORDER — DEXAMETHASONE SODIUM PHOSPHATE 10 MG/ML IJ SOLN
INTRAMUSCULAR | Status: AC
  Filled 2019-09-15: qty 1

## 2019-09-15 MED ORDER — MIDAZOLAM HCL 5 MG/5ML IJ SOLN
INTRAMUSCULAR | Status: DC | PRN
  Administered 2019-09-15: 1 mg via INTRAVENOUS

## 2019-09-15 MED ORDER — OXYCODONE HCL 5 MG PO TABS: 5.0000 mg | ORAL_TABLET | Freq: Once | ORAL | Status: DC | PRN

## 2019-09-15 SURGICAL SUPPLY — 57 items
ADH SKN CLS APL DERMABOND .7 (GAUZE/BANDAGES/DRESSINGS) ×3
APL PRP STRL LF DISP 70% ISPRP (MISCELLANEOUS) ×3
BAG SNAP BAND KOVER 36X36 (MISCELLANEOUS) ×3 IMPLANT
BLADE SURG 11 STRL SS (BLADE) ×3 IMPLANT
CANISTER SUCT 3000ML PPV (MISCELLANEOUS) ×5 IMPLANT
CATH BEACON 5 .035 65 KMP TIP (CATHETERS) ×2 IMPLANT
CATH CROSS OVER TEMPO 5F (CATHETERS) ×2 IMPLANT
CATH OMNI FLUSH 5F 65CM (CATHETERS) ×5 IMPLANT
CATH QUICKCROSS SUPP .035X90CM (MICROCATHETER) ×2 IMPLANT
CHLORAPREP W/TINT 26 (MISCELLANEOUS) ×5 IMPLANT
COVER BACK TABLE 80X110 HD (DRAPES) ×6 IMPLANT
COVER DOME SNAP 22 D (MISCELLANEOUS) ×3 IMPLANT
COVER PROBE W GEL 5X96 (DRAPES) ×3 IMPLANT
COVER SURGICAL LIGHT HANDLE (MISCELLANEOUS) ×3 IMPLANT
COVER WAND RF STERILE (DRAPES) ×3 IMPLANT
DERMABOND ADVANCED (GAUZE/BANDAGES/DRESSINGS) ×2
DERMABOND ADVANCED .7 DNX12 (GAUZE/BANDAGES/DRESSINGS) ×3 IMPLANT
DEVICE TORQUE KENDALL .025-038 (MISCELLANEOUS) ×2 IMPLANT
DRAPE FEMORAL ANGIO 80X135IN (DRAPES) ×3 IMPLANT
ELECT REM PT RETURN 9FT ADLT (ELECTROSURGICAL)
ELECTRODE REM PT RTRN 9FT ADLT (ELECTROSURGICAL) IMPLANT
FILTER CO2 0.2 MICRON (VASCULAR PRODUCTS) IMPLANT
FILTER CO2 INSUFFLATOR AX1008 (MISCELLANEOUS) IMPLANT
GAUZE 4X4 16PLY RFD (DISPOSABLE) ×5 IMPLANT
GAUZE SPONGE 4X4 16PLY XRAY LF (GAUZE/BANDAGES/DRESSINGS) ×2 IMPLANT
GLIDEWIRE ADV .035X260CM (WIRE) ×2 IMPLANT
GLOVE BIO SURGEON STRL SZ7.5 (GLOVE) ×5 IMPLANT
GOWN STRL REUS W/ TWL LRG LVL3 (GOWN DISPOSABLE) ×6 IMPLANT
GOWN STRL REUS W/ TWL XL LVL3 (GOWN DISPOSABLE) ×3 IMPLANT
GOWN STRL REUS W/TWL LRG LVL3 (GOWN DISPOSABLE) ×10
GOWN STRL REUS W/TWL XL LVL3 (GOWN DISPOSABLE) ×5
GUIDEWIRE ANGLED .035X150CM (WIRE) IMPLANT
KIT BASIN OR (CUSTOM PROCEDURE TRAY) ×3 IMPLANT
KIT TURNOVER KIT B (KITS) ×5 IMPLANT
NDL PERC 18GX7CM (NEEDLE) ×3 IMPLANT
NEEDLE PERC 18GX7CM (NEEDLE) IMPLANT
NS IRRIG 1000ML POUR BTL (IV SOLUTION) IMPLANT
PACK ENDO MINOR (CUSTOM PROCEDURE TRAY) ×2 IMPLANT
PAD ARMBOARD 7.5X6 YLW CONV (MISCELLANEOUS) ×10 IMPLANT
SET FLUSH CO2 (MISCELLANEOUS) IMPLANT
SET MICROPUNCTURE 5F STIFF (MISCELLANEOUS) ×2 IMPLANT
SHEATH PINNACLE 5F 10CM (SHEATH) ×2 IMPLANT
STOPCOCK 4 WAY LG BORE MALE ST (IV SETS) ×2 IMPLANT
STOPCOCK MORSE 400PSI 3WAY (MISCELLANEOUS) ×5 IMPLANT
SYR 10ML LL (SYRINGE) ×12 IMPLANT
SYR 20ML LL LF (SYRINGE) ×6 IMPLANT
SYR 30ML LL (SYRINGE) ×3 IMPLANT
SYR 3ML LL SCALE MARK (SYRINGE) ×2 IMPLANT
SYR MEDRAD MARK V 150ML (SYRINGE) ×2 IMPLANT
TOWEL GREEN STERILE (TOWEL DISPOSABLE) ×10 IMPLANT
TRANSDUCER DISP STR W/STOPCOCK (MISCELLANEOUS) ×2 IMPLANT
TUBING ART PRESS 72  MALE/FEM (TUBING) ×2
TUBING ART PRESS 72 MALE/FEM (TUBING) IMPLANT
TUBING HIGH PRESSURE 120CM (CONNECTOR) ×2 IMPLANT
UNDERPAD 30X30 (UNDERPADS AND DIAPERS) IMPLANT
WATER STERILE IRR 1000ML POUR (IV SOLUTION) IMPLANT
WIRE BENTSON .035X145CM (WIRE) ×7 IMPLANT

## 2019-09-15 NOTE — Anesthesia Procedure Notes (Signed)
Procedure Name: MAC Date/Time: 09/15/2019 7:37 AM Performed by: Colin Benton, CRNA Pre-anesthesia Checklist: Patient identified, Emergency Drugs available, Suction available and Patient being monitored Patient Re-evaluated:Patient Re-evaluated prior to induction Oxygen Delivery Method: Simple face mask Induction Type: IV induction Placement Confirmation: positive ETCO2 Dental Injury: Teeth and Oropharynx as per pre-operative assessment

## 2019-09-15 NOTE — Op Note (Signed)
    Patient name: Lauren [name] MRN: [id number] DOB: August 01, 1951 Sex: female  09/15/2019 Pre-operative Diagnosis: Critical right lower extremity ischemia with toe discoloration ABI of 0 Post-operative diagnosis:  Same Surgeon:  Eda Paschal. Donzetta Matters, MD Procedure Performed: 1.  Ultrasound-guided cannulation left common femoral artery 2.  Aortogram  3.  Selection of right SFA and right lower extremity angiogram 4.  Pressure gradient right lower extremity  Indications: 68 year old female history of multiple medical issues has a stent in her left SFA popliteal.  She now has an ABI of 0 on the right with suggestion of proximal disease.  Findings: The aorta and iliac segments appear again diminutive she has significant bowel gas but the runoff is non-limited.  The right lower extremity there appears to be near occlusion of the common femoral artery at the profunda femoris takeoff.  The SFA is diminutive but there is no greater than 50% stenosis and dominant runoff is via the posterior tibial artery.  Pullback gradient was approximately 40 mmHg across the common femoral artery.  Patient will need right common femoral endarterectomy.   Procedure:  The patient was identified in the holding area and taken to the operating room where she is placed by operative MAC anesthesia induced.  She was gently prepped and draped in the left groin usual fashion antibiotics were minister timeout called.  Ultrasound was used to identify the left common femoral artery.  This was somewhat diseased this was cannulated micropuncture needle after 10 cc of local was injected.  Micropuncture wire followed by sheath was placed.  Bentson wire was placed under fluoroscopic guidance 5 French sheath was placed.  Omni catheter was placed to level L1 aortogram was performed.  I had to use a crossover catheter and Glidewire advantage to get over the bifurcation.  I performed limited right lower extremity angiography from there.  Really  could not see distally so I used quick cross catheter and Glidewire to cross the common femoral artery.  This was technically difficult.  I completed the right lower extremity in geography.  I then did a pullback gradient which was a systolic 40 mmHg across the common femoral artery.  Patient is going to be best served with right common femoral endarterectomy as well as afraid to perform any stenting of the common femoral artery given this would jail the profunda at the location of the stenosis/occlusion.  The wire and catheter were removed the sheath was pulled pressure held until hemostasis obtained.  She was taken to the recovery room in stable condition.  Contrast: 50 cc    C. Donzetta Matters, MD [facility] [place] Office: [phone] Pager: [phone]

## 2019-09-15 NOTE — Anesthesia Postprocedure Evaluation (Signed)
Anesthesia Post Note  Patient: Lauren [name]  Procedure(s) Performed: AORTOGRAM (N/A Abdomen) Ultrasound Guidance For Vascular Access of left femoral artery (Left Groin) right Lower Extremity Angiogram  with pressure gradiant of right common femoral artery (Right Leg Lower)     Patient location during evaluation: PACU Anesthesia Type: MAC Level of consciousness: awake and alert Pain management: pain level controlled Vital Signs Assessment: post-procedure vital signs reviewed and stable Respiratory status: spontaneous breathing, nonlabored ventilation, respiratory function stable and patient connected to nasal cannula oxygen Cardiovascular status: stable and blood pressure returned to baseline Postop Assessment: no apparent nausea or vomiting Anesthetic complications: no    Last Vitals:  Vitals:   09/15/19 1300 09/15/19 1304  BP: (!) 122/47 (!) 122/47  Pulse: 74 73  Resp: 15   Temp: 36.6 C   SpO2: 99% 98%    Last Pain:  Vitals:   09/15/19 1300  TempSrc:   PainSc: 0-No pain                 Willy Vorce

## 2019-09-15 NOTE — H&P (Signed)
   History and Physical Update  The patient was interviewed and re-examined.  The patient's previous History and Physical has been reviewed and is unchanged from recent office visit. Plan for aortogram with bilateral runoff and possible intervention on the right.   Analeese Andreatta C. Donzetta Matters, MD [facility] [place] Office: [phone] Pager: [phone]   09/15/2019, 7:21 AM

## 2019-09-15 NOTE — Transfer of Care (Signed)
Immediate Anesthesia Transfer of Care Note  Patient: Lauren [name]  Procedure(s) Performed: AORTOGRAM (N/A Abdomen) Ultrasound Guidance For Vascular Access of left femoral artery (Left Groin) right Lower Extremity Angiogram  with pressure gradiant of right common femoral artery (Right Leg Lower)  Patient Location: PACU  Anesthesia Type:MAC  Level of Consciousness: drowsy and patient cooperative  Airway & Oxygen Therapy: Patient Spontanous Breathing and Patient connected to face mask oxygen  Post-op Assessment: Report given to RN and Post -op Vital signs reviewed and stable  Post vital signs: Reviewed and stable  Last Vitals:  Vitals Value Taken Time  BP 78/45 09/15/19 0916  Temp    Pulse 77 09/15/19 0917  Resp 17 09/15/19 0917  SpO2 100 % 09/15/19 0917  Vitals shown include unvalidated device data.  Last Pain:  Vitals:   09/15/19 0643  TempSrc:   PainSc: 0-No pain      Patients Stated Pain Goal: 3 (34/06/84 0335)  Complications: No apparent anesthesia complications

## 2019-09-15 NOTE — Progress Notes (Signed)
Inpatient Diabetes Program Recommendations  AACE/ADA: New Consensus Statement on Inpatient Glycemic Control (2015)  Target Ranges:  Prepandial:   less than 140 mg/dL      Peak postprandial:   less than 180 mg/dL (1-2 hours)      Critically ill patients:  140 - 180 mg/dL   Lab Results  Component Value Date   GLUCAP 216 (H) 09/15/2019   HGBA1C 9.4 (H) 09/13/2019    Review of Glycemic Control Results for KIELEY, AKTER (MRN [id number]) as of 09/15/2019 10:17  Ref. Range 09/13/2019 15:08 09/15/2019 06:19 09/15/2019 09:20  Glucose-Capillary Latest Ref Range: 70 - 99 mg/dL 222 (H) 219 (H) 216 (H)   Diabetes history: Type 2 DM Outpatient Diabetes medications: Lantus 50 units @ QD, Soliqua 100-33- 60 units QAM Current orders for Inpatient glycemic control: none  Inpatient Diabetes Program Recommendations:    Consider -Lantus 20 units QD -Novolog 0-9 units TID & HS  Patient is followed by Dr Denton Lank, outpatient endocrinology with last appointment 07/04/2019 and patient has met with dietitians in August for dietary changes. Plan towards 70/30 if no improvement by next visit.   Thanks, Bronson Curb, MSN, RNC-OB Diabetes Coordinator [phone] (8a-5p)

## 2019-09-16 ENCOUNTER — Encounter (HOSPITAL_COMMUNITY): Payer: Self-pay | Admitting: Vascular Surgery

## 2019-09-22 ENCOUNTER — Other Ambulatory Visit: Payer: Self-pay | Admitting: Cardiovascular Disease

## 2019-09-26 ENCOUNTER — Other Ambulatory Visit (HOSPITAL_COMMUNITY)
Admission: RE | Admit: 2019-09-26 | Discharge: 2019-09-26 | Disposition: A | Discharge status: Home / Self Care | Payer: Medicare Other | Source: Ambulatory Visit | Attending: Vascular Surgery | Admitting: Vascular Surgery

## 2019-09-26 ENCOUNTER — Telehealth: Payer: Self-pay

## 2019-09-26 DIAGNOSIS — Z20828 Contact with and (suspected) exposure to other viral communicable diseases: Secondary | ICD-10-CM | POA: Insufficient documentation

## 2019-09-26 DIAGNOSIS — Z01812 Encounter for preprocedural laboratory examination: Secondary | ICD-10-CM | POA: Insufficient documentation

## 2019-09-26 NOTE — Telephone Encounter (Signed)
Patients husband called and said that she is having a lot of pain and her leg is looking much worse. He said that he wanted to know if we would send her something in for pain. He said that he called the pain clinic and they told him that he could not get more medicine from them.    Advised him that if she has a pain contract with the pain clinic that they would have to go through them for anything additional. Tried to explain to the husband that when they sign a pain contract it is with the intent to not have medication prescribed by any other physician. He said that he did not understand this as their drs told him that he would have to speak with [place]. Told him that they may have wanted him to call just to see if we could move up her surgery. He said that he did not want to do that but thought instead he should tear up the contract. Advised him that with the new laws with narcotics a physician managing long term pain meds has to set this in place.   Advised him if her pain got worse through the night or her symptoms changed and became of concern that he should take her to the ER for evaluation.   York Cerise, CMA

## 2019-09-27 ENCOUNTER — Telehealth: Payer: Self-pay | Admitting: Vascular Surgery

## 2019-09-27 LAB — NOVEL CORONAVIRUS, NAA (HOSP ORDER, SEND-OUT TO REF LAB; TAT 18-24 HRS): SARS-CoV-2, NAA: NOT DETECTED

## 2019-09-27 NOTE — Telephone Encounter (Signed)
Rep at [facility] called and left message to advise [place] that patient is already using their services and should continue after her hospital discharge.    Thurston Hole., LPN

## 2019-09-28 ENCOUNTER — Encounter (HOSPITAL_COMMUNITY): Payer: Self-pay | Admitting: *Deleted

## 2019-09-28 NOTE — Progress Notes (Signed)
Only Instructions for DOS were given.  Patient denies shortness of breath, fever, cough and chest pain.   Anesthesia review: Yes, reviewed medical hx with Dr Marcie Bal.  [place] for surgery per Dr. Marcie Bal.Marland Kitchen  PCP - Dr Lucianne Lei Cardiologist - Dr Tilden Fossa  Chest x-ray - Denies EKG - 07/08/19 Stress Test - 08/10/15 ECHO - 08/22/17 Cardiac Cath -  Denies  Blood Thinner Instructions:  Stop Eliquis 48 hours prior to surgery per MD.  Last dose on Mon, 09/26/19  : STOP now taking any Aspirin (unless otherwise instructed by your surgeon), Aleve, Naproxen, Ibuprofen, Motrin, Advil, Goody's, BC's, all herbal medications, fish oil, and all vitamins.   Coronavirus Screening Have you or your husband experienced the following symptoms:  Cough yes/no: No Fever (>100.51F)  yes/no: No Runny nose yes/no: No Sore throat yes/no: No Difficulty breathing/shortness of breath  yes/no: No  Have you or your husband traveled in the last 14 days and where? yes/no: No   Husband/Patient verbalized understanding of instructions that were given to them via phone.

## 2019-09-29 ENCOUNTER — Inpatient Hospital Stay (HOSPITAL_COMMUNITY): Payer: Medicare Other | Admitting: Certified Registered"

## 2019-09-29 ENCOUNTER — Other Ambulatory Visit: Payer: Self-pay

## 2019-09-29 ENCOUNTER — Inpatient Hospital Stay (HOSPITAL_COMMUNITY)
Admission: RE | Admit: 2019-09-29 | Discharge: 2019-11-18 | DRG: 253 | Disposition: A | Discharge status: Hospice | Payer: Medicare Other | Attending: Vascular Surgery | Admitting: Vascular Surgery

## 2019-09-29 ENCOUNTER — Encounter (HOSPITAL_COMMUNITY)
Admission: RE | Disposition: A | Discharge status: Hospice | Payer: Self-pay | Source: Home / Self Care | Attending: Vascular Surgery

## 2019-09-29 ENCOUNTER — Encounter (HOSPITAL_COMMUNITY): Payer: Self-pay

## 2019-09-29 DIAGNOSIS — E1142 Type 2 diabetes mellitus with diabetic polyneuropathy: Secondary | ICD-10-CM | POA: Diagnosis present

## 2019-09-29 DIAGNOSIS — Z8673 Personal history of transient ischemic attack (TIA), and cerebral infarction without residual deficits: Secondary | ICD-10-CM | POA: Diagnosis not present

## 2019-09-29 DIAGNOSIS — Z7951 Long term (current) use of inhaled steroids: Secondary | ICD-10-CM

## 2019-09-29 DIAGNOSIS — Z833 Family history of diabetes mellitus: Secondary | ICD-10-CM

## 2019-09-29 DIAGNOSIS — M21372 Foot drop, left foot: Secondary | ICD-10-CM | POA: Diagnosis present

## 2019-09-29 DIAGNOSIS — G8929 Other chronic pain: Secondary | ICD-10-CM | POA: Diagnosis present

## 2019-09-29 DIAGNOSIS — Z8542 Personal history of malignant neoplasm of other parts of uterus: Secondary | ICD-10-CM

## 2019-09-29 DIAGNOSIS — I743 Embolism and thrombosis of arteries of the lower extremities: Secondary | ICD-10-CM | POA: Diagnosis present

## 2019-09-29 DIAGNOSIS — Z9889 Other specified postprocedural states: Secondary | ICD-10-CM | POA: Diagnosis not present

## 2019-09-29 DIAGNOSIS — Z515 Encounter for palliative care: Secondary | ICD-10-CM

## 2019-09-29 DIAGNOSIS — T83511A Infection and inflammatory reaction due to indwelling urethral catheter, initial encounter: Secondary | ICD-10-CM | POA: Diagnosis present

## 2019-09-29 DIAGNOSIS — Z79899 Other long term (current) drug therapy: Secondary | ICD-10-CM

## 2019-09-29 DIAGNOSIS — Z794 Long term (current) use of insulin: Secondary | ICD-10-CM

## 2019-09-29 DIAGNOSIS — I70201 Unspecified atherosclerosis of native arteries of extremities, right leg: Secondary | ICD-10-CM | POA: Diagnosis present

## 2019-09-29 DIAGNOSIS — R531 Weakness: Secondary | ICD-10-CM | POA: Diagnosis not present

## 2019-09-29 DIAGNOSIS — R52 Pain, unspecified: Secondary | ICD-10-CM

## 2019-09-29 DIAGNOSIS — N390 Urinary tract infection, site not specified: Secondary | ICD-10-CM | POA: Diagnosis present

## 2019-09-29 DIAGNOSIS — Z23 Encounter for immunization: Secondary | ICD-10-CM

## 2019-09-29 DIAGNOSIS — F419 Anxiety disorder, unspecified: Secondary | ICD-10-CM | POA: Diagnosis present

## 2019-09-29 DIAGNOSIS — E1152 Type 2 diabetes mellitus with diabetic peripheral angiopathy with gangrene: Principal | ICD-10-CM | POA: Diagnosis present

## 2019-09-29 DIAGNOSIS — Z87891 Personal history of nicotine dependence: Secondary | ICD-10-CM | POA: Diagnosis not present

## 2019-09-29 DIAGNOSIS — Z993 Dependence on wheelchair: Secondary | ICD-10-CM

## 2019-09-29 DIAGNOSIS — Z86718 Personal history of other venous thrombosis and embolism: Secondary | ICD-10-CM | POA: Diagnosis not present

## 2019-09-29 DIAGNOSIS — I2510 Atherosclerotic heart disease of native coronary artery without angina pectoris: Secondary | ICD-10-CM | POA: Diagnosis present

## 2019-09-29 DIAGNOSIS — I739 Peripheral vascular disease, unspecified: Secondary | ICD-10-CM

## 2019-09-29 DIAGNOSIS — Z7901 Long term (current) use of anticoagulants: Secondary | ICD-10-CM

## 2019-09-29 DIAGNOSIS — I10 Essential (primary) hypertension: Secondary | ICD-10-CM | POA: Diagnosis present

## 2019-09-29 DIAGNOSIS — Z8349 Family history of other endocrine, nutritional and metabolic diseases: Secondary | ICD-10-CM

## 2019-09-29 DIAGNOSIS — Z20828 Contact with and (suspected) exposure to other viral communicable diseases: Secondary | ICD-10-CM | POA: Diagnosis present

## 2019-09-29 DIAGNOSIS — I2781 Cor pulmonale (chronic): Secondary | ICD-10-CM | POA: Diagnosis present

## 2019-09-29 DIAGNOSIS — M21371 Foot drop, right foot: Secondary | ICD-10-CM | POA: Diagnosis present

## 2019-09-29 DIAGNOSIS — Z8249 Family history of ischemic heart disease and other diseases of the circulatory system: Secondary | ICD-10-CM

## 2019-09-29 DIAGNOSIS — Z792 Long term (current) use of antibiotics: Secondary | ICD-10-CM

## 2019-09-29 DIAGNOSIS — I70221 Atherosclerosis of native arteries of extremities with rest pain, right leg: Secondary | ICD-10-CM | POA: Diagnosis not present

## 2019-09-29 DIAGNOSIS — J449 Chronic obstructive pulmonary disease, unspecified: Secondary | ICD-10-CM | POA: Diagnosis present

## 2019-09-29 DIAGNOSIS — Y846 Urinary catheterization as the cause of abnormal reaction of the patient, or of later complication, without mention of misadventure at the time of the procedure: Secondary | ICD-10-CM | POA: Diagnosis present

## 2019-09-29 DIAGNOSIS — Z6831 Body mass index (BMI) 31.0-31.9, adult: Secondary | ICD-10-CM

## 2019-09-29 DIAGNOSIS — K219 Gastro-esophageal reflux disease without esophagitis: Secondary | ICD-10-CM | POA: Diagnosis present

## 2019-09-29 DIAGNOSIS — L8990 Pressure ulcer of unspecified site, unspecified stage: Secondary | ICD-10-CM | POA: Insufficient documentation

## 2019-09-29 DIAGNOSIS — Z66 Do not resuscitate: Secondary | ICD-10-CM | POA: Diagnosis not present

## 2019-09-29 DIAGNOSIS — M549 Dorsalgia, unspecified: Secondary | ICD-10-CM | POA: Diagnosis present

## 2019-09-29 DIAGNOSIS — F329 Major depressive disorder, single episode, unspecified: Secondary | ICD-10-CM | POA: Diagnosis present

## 2019-09-29 DIAGNOSIS — Z823 Family history of stroke: Secondary | ICD-10-CM

## 2019-09-29 DIAGNOSIS — E785 Hyperlipidemia, unspecified: Secondary | ICD-10-CM | POA: Diagnosis present

## 2019-09-29 DIAGNOSIS — Z79891 Long term (current) use of opiate analgesic: Secondary | ICD-10-CM

## 2019-09-29 DIAGNOSIS — D72829 Elevated white blood cell count, unspecified: Secondary | ICD-10-CM

## 2019-09-29 HISTORY — PX: ENDARTERECTOMY FEMORAL: SHX5804

## 2019-09-29 LAB — COMPREHENSIVE METABOLIC PANEL
ALT: 24 U/L (ref 0–44)
AST: 28 U/L (ref 15–41)
Albumin: 3.2 g/dL — ABNORMAL LOW (ref 3.5–5.0)
Alkaline Phosphatase: 57 U/L (ref 38–126)
Anion gap: 12 (ref 5–15)
BUN: 17 mg/dL (ref 8–23)
CO2: 28 mmol/L (ref 22–32)
Calcium: 8.9 mg/dL (ref 8.9–10.3)
Chloride: 96 mmol/L — ABNORMAL LOW (ref 98–111)
Creatinine, Ser: 1.18 mg/dL — ABNORMAL HIGH (ref 0.44–1.00)
GFR calc Af Amer: 54 mL/min — ABNORMAL LOW (ref >60)
GFR calc non Af Amer: 47 mL/min — ABNORMAL LOW (ref >60)
Glucose, Bld: 137 mg/dL — ABNORMAL HIGH (ref 70–99)
Potassium: 3.8 mmol/L (ref 3.5–5.1)
Sodium: 136 mmol/L (ref 135–145)
Total Bilirubin: 0.6 mg/dL (ref 0.3–1.2)
Total Protein: 6.9 g/dL (ref 6.5–8.1)

## 2019-09-29 LAB — CBC
HCT: 35.9 % — ABNORMAL LOW (ref 36.0–46.0)
HCT: 30 % — ABNORMAL LOW (ref 36.0–46.0)
Hemoglobin: 10.5 g/dL — ABNORMAL LOW (ref 12.0–15.0)
Hemoglobin: 9.4 g/dL — ABNORMAL LOW (ref 12.0–15.0)
MCH: 27.3 pg (ref 26.0–34.0)
MCH: 28.6 pg (ref 26.0–34.0)
MCHC: 29.2 g/dL — ABNORMAL LOW (ref 30.0–36.0)
MCHC: 31.3 g/dL (ref 30.0–36.0)
MCV: 93.5 fL (ref 80.0–100.0)
MCV: 91.2 fL (ref 80.0–100.0)
Platelets: 516 10*3/uL — ABNORMAL HIGH (ref 150–400)
Platelets: 435 10*3/uL — ABNORMAL HIGH (ref 150–400)
RBC: 3.84 MIL/uL — ABNORMAL LOW (ref 3.87–5.11)
RBC: 3.29 MIL/uL — ABNORMAL LOW (ref 3.87–5.11)
RDW: 17.6 % — ABNORMAL HIGH (ref 11.5–15.5)
RDW: 17.6 % — ABNORMAL HIGH (ref 11.5–15.5)
WBC: 11.9 10*3/uL — ABNORMAL HIGH (ref 4.0–10.5)
WBC: 16 10*3/uL — ABNORMAL HIGH (ref 4.0–10.5)
nRBC: 0 % (ref 0.0–0.2)
nRBC: 0 % (ref 0.0–0.2)

## 2019-09-29 LAB — URINALYSIS, ROUTINE W REFLEX MICROSCOPIC
Bilirubin Urine: NEGATIVE
Glucose, UA: NEGATIVE mg/dL
Ketones, ur: NEGATIVE mg/dL
Nitrite: NEGATIVE
Protein, ur: 100 mg/dL — AB
Specific Gravity, Urine: 1.015 (ref 1.005–1.030)
pH: 5 (ref 5.0–8.0)

## 2019-09-29 LAB — APTT: aPTT: 31 seconds (ref 24–36)

## 2019-09-29 LAB — PROTIME-INR
INR: 1.2 (ref 0.8–1.2)
Prothrombin Time: 14.6 seconds (ref 11.4–15.2)

## 2019-09-29 LAB — CREATININE, SERUM
Creatinine, Ser: 1.25 mg/dL — ABNORMAL HIGH (ref 0.44–1.00)
GFR calc Af Amer: 51 mL/min — ABNORMAL LOW (ref >60)
GFR calc non Af Amer: 44 mL/min — ABNORMAL LOW (ref >60)

## 2019-09-29 LAB — GLUCOSE, CAPILLARY
Glucose-Capillary: 122 mg/dL — ABNORMAL HIGH (ref 70–99)
Glucose-Capillary: 170 mg/dL — ABNORMAL HIGH (ref 70–99)
Glucose-Capillary: 255 mg/dL — ABNORMAL HIGH (ref 70–99)
Glucose-Capillary: 320 mg/dL — ABNORMAL HIGH (ref 70–99)
Glucose-Capillary: 275 mg/dL — ABNORMAL HIGH (ref 70–99)

## 2019-09-29 LAB — TYPE AND SCREEN
ABO/RH(D): B POS
Antibody Screen: NEGATIVE

## 2019-09-29 SURGERY — ENDARTERECTOMY, FEMORAL
Anesthesia: General | Site: Groin | Laterality: Right

## 2019-09-29 MED ORDER — ADULT MULTIVITAMIN W/MINERALS CH
1.0000 | ORAL_TABLET | Freq: Every day | ORAL | Status: DC
  Administered 2019-09-30 – 2019-10-25 (×23): 1 via ORAL
  Filled 2019-09-29 (×24): qty 1

## 2019-09-29 MED ORDER — ROCURONIUM BROMIDE 10 MG/ML (PF) SYRINGE
PREFILLED_SYRINGE | INTRAVENOUS | Status: DC | PRN
  Administered 2019-09-29: 50 mg via INTRAVENOUS

## 2019-09-29 MED ORDER — LIDOCAINE 2% (20 MG/ML) 5 ML SYRINGE
INTRAMUSCULAR | Status: DC | PRN
  Administered 2019-09-29: 60 mg via INTRAVENOUS

## 2019-09-29 MED ORDER — HEPARIN SODIUM (PORCINE) 1000 UNIT/ML IJ SOLN
INTRAMUSCULAR | Status: DC | PRN
  Administered 2019-09-29: 10000 [IU] via INTRAVENOUS

## 2019-09-29 MED ORDER — AMLODIPINE BESYLATE 5 MG PO TABS
5.0000 mg | ORAL_TABLET | Freq: Every day | ORAL | Status: DC
  Administered 2019-09-30 – 2019-11-18 (×49): 5 mg via ORAL
  Filled 2019-09-29 (×50): qty 1

## 2019-09-29 MED ORDER — SENNA 8.6 MG PO TABS
1.0000 | ORAL_TABLET | Freq: Two times a day (BID) | ORAL | Status: DC
  Administered 2019-09-29 – 2019-11-18 (×83): 8.6 mg via ORAL
  Filled 2019-09-29 (×89): qty 1

## 2019-09-29 MED ORDER — METAXALONE 800 MG PO TABS
800.0000 mg | ORAL_TABLET | Freq: Three times a day (TID) | ORAL | Status: DC
  Administered 2019-09-29 – 2019-11-18 (×142): 800 mg via ORAL
  Filled 2019-09-29 (×162): qty 1

## 2019-09-29 MED ORDER — ONDANSETRON HCL 4 MG/2ML IJ SOLN
INTRAMUSCULAR | Status: AC
  Filled 2019-09-29: qty 2

## 2019-09-29 MED ORDER — BETAMETHASONE DIPROPIONATE 0.05 % EX OINT: 1.0000 "application " | TOPICAL_OINTMENT | Freq: Two times a day (BID) | CUTANEOUS | Status: DC | PRN

## 2019-09-29 MED ORDER — ONDANSETRON HCL 4 MG/2ML IJ SOLN
INTRAMUSCULAR | Status: AC
  Filled 2019-09-29: qty 4

## 2019-09-29 MED ORDER — FERROUS SULFATE 325 (65 FE) MG PO TABS
325.0000 mg | ORAL_TABLET | Freq: Every day | ORAL | Status: DC
  Administered 2019-09-30 – 2019-10-26 (×25): 325 mg via ORAL
  Filled 2019-09-29 (×26): qty 1

## 2019-09-29 MED ORDER — INSULIN GLARGINE-LIXISENATIDE 100-33 UNT-MCG/ML ~~LOC~~ SOPN: 60.0000 [IU] | PEN_INJECTOR | Freq: Every morning | SUBCUTANEOUS | Status: DC

## 2019-09-29 MED ORDER — SODIUM CHLORIDE 0.9 % IV SOLN
INTRAVENOUS | Status: AC
  Filled 2019-09-29: qty 1.2

## 2019-09-29 MED ORDER — POTASSIUM CHLORIDE CRYS ER 20 MEQ PO TBCR: 20.0000 meq | EXTENDED_RELEASE_TABLET | Freq: Every day | ORAL | Status: DC | PRN

## 2019-09-29 MED ORDER — PANTOPRAZOLE SODIUM 40 MG PO TBEC
40.0000 mg | DELAYED_RELEASE_TABLET | Freq: Every day | ORAL | Status: DC
  Administered 2019-09-30 – 2019-11-18 (×48): 40 mg via ORAL
  Filled 2019-09-29 (×48): qty 1

## 2019-09-29 MED ORDER — SODIUM CHLORIDE 0.9 % IV SOLN
INTRAVENOUS | Status: DC | PRN
  Administered 2019-09-29: 08:00:00 25 ug/min via INTRAVENOUS

## 2019-09-29 MED ORDER — BISACODYL 5 MG PO TBEC
5.0000 mg | DELAYED_RELEASE_TABLET | Freq: Every day | ORAL | Status: DC | PRN
  Administered 2019-10-03 – 2019-10-24 (×4): 5 mg via ORAL
  Filled 2019-09-29 (×4): qty 1

## 2019-09-29 MED ORDER — HEPARIN SODIUM (PORCINE) 5000 UNIT/ML IJ SOLN: 5000.0000 [IU] | Freq: Three times a day (TID) | INTRAMUSCULAR | Status: DC

## 2019-09-29 MED ORDER — CHLORHEXIDINE GLUCONATE CLOTH 2 % EX PADS: 6.0000 | MEDICATED_PAD | Freq: Once | CUTANEOUS | Status: DC

## 2019-09-29 MED ORDER — APIXABAN 5 MG PO TABS
5.0000 mg | ORAL_TABLET | Freq: Two times a day (BID) | ORAL | Status: DC
  Administered 2019-09-30 – 2019-10-12 (×26): 5 mg via ORAL
  Filled 2019-09-29 (×26): qty 1

## 2019-09-29 MED ORDER — CEFAZOLIN SODIUM-DEXTROSE 2-4 GM/100ML-% IV SOLN
INTRAVENOUS | Status: AC
  Filled 2019-09-29: qty 100

## 2019-09-29 MED ORDER — ONDANSETRON HCL 4 MG/2ML IJ SOLN
4.0000 mg | Freq: Four times a day (QID) | INTRAMUSCULAR | Status: DC | PRN
  Administered 2019-10-13 – 2019-10-30 (×4): 4 mg via INTRAVENOUS
  Filled 2019-09-29 (×2): qty 2

## 2019-09-29 MED ORDER — FOLIC ACID 1 MG PO TABS
1000.0000 ug | ORAL_TABLET | Freq: Every day | ORAL | Status: DC
  Administered 2019-09-30 – 2019-11-17 (×47): 1 mg via ORAL
  Filled 2019-09-29 (×48): qty 1

## 2019-09-29 MED ORDER — ALUM & MAG HYDROXIDE-SIMETH 200-200-20 MG/5ML PO SUSP
15.0000 mL | ORAL | Status: DC | PRN
  Administered 2019-10-20: 13:00:00 15 mL via ORAL
  Filled 2019-09-29: qty 30

## 2019-09-29 MED ORDER — INSULIN ASPART 100 UNIT/ML ~~LOC~~ SOLN
SUBCUTANEOUS | Status: AC
  Filled 2019-09-29: qty 1

## 2019-09-29 MED ORDER — LABETALOL HCL 5 MG/ML IV SOLN: 10.0000 mg | INTRAVENOUS | Status: DC | PRN

## 2019-09-29 MED ORDER — INSULIN ASPART 100 UNIT/ML ~~LOC~~ SOLN
0.0000 [IU] | Freq: Three times a day (TID) | SUBCUTANEOUS | Status: DC
  Administered 2019-09-29: 18:00:00 7 [IU] via SUBCUTANEOUS
  Administered 2019-09-29: 5 [IU] via SUBCUTANEOUS
  Administered 2019-09-30: 17:00:00 3 [IU] via SUBCUTANEOUS
  Administered 2019-09-30 – 2019-10-01 (×3): 2 [IU] via SUBCUTANEOUS
  Administered 2019-10-01 (×2): 3 [IU] via SUBCUTANEOUS
  Administered 2019-10-02 (×2): 5 [IU] via SUBCUTANEOUS
  Administered 2019-10-02: 07:00:00 3 [IU] via SUBCUTANEOUS
  Administered 2019-10-03 (×2): 7 [IU] via SUBCUTANEOUS
  Administered 2019-10-03: 13:00:00 10 [IU] via SUBCUTANEOUS
  Administered 2019-10-04: 17:00:00 9 [IU] via SUBCUTANEOUS
  Administered 2019-10-04: 7 [IU] via SUBCUTANEOUS
  Administered 2019-10-04: 07:00:00 3 [IU] via SUBCUTANEOUS
  Administered 2019-10-05: 18:00:00 5 [IU] via SUBCUTANEOUS
  Administered 2019-10-05: 07:00:00 2 [IU] via SUBCUTANEOUS
  Administered 2019-10-05: 7 [IU] via SUBCUTANEOUS
  Administered 2019-10-06: 3 [IU] via SUBCUTANEOUS
  Administered 2019-10-06: 7 [IU] via SUBCUTANEOUS
  Administered 2019-10-06: 07:00:00 3 [IU] via SUBCUTANEOUS
  Administered 2019-10-07: 16:00:00 5 [IU] via SUBCUTANEOUS
  Administered 2019-10-07: 07:00:00 2 [IU] via SUBCUTANEOUS
  Administered 2019-10-07: 12:00:00 5 [IU] via SUBCUTANEOUS

## 2019-09-29 MED ORDER — FENTANYL CITRATE (PF) 100 MCG/2ML IJ SOLN
25.0000 ug | INTRAMUSCULAR | Status: DC | PRN
  Administered 2019-09-29 (×2): 25 ug via INTRAVENOUS

## 2019-09-29 MED ORDER — ROSUVASTATIN CALCIUM 5 MG PO TABS
5.0000 mg | ORAL_TABLET | Freq: Every evening | ORAL | Status: DC
  Administered 2019-09-29 – 2019-11-16 (×49): 5 mg via ORAL
  Filled 2019-09-29 (×49): qty 1

## 2019-09-29 MED ORDER — MIDAZOLAM HCL 2 MG/2ML IJ SOLN
INTRAMUSCULAR | Status: AC
  Filled 2019-09-29: qty 2

## 2019-09-29 MED ORDER — OXYCODONE HCL 5 MG PO TABS
5.0000 mg | ORAL_TABLET | Freq: Once | ORAL | Status: AC | PRN
  Administered 2019-09-29: 5 mg via ORAL

## 2019-09-29 MED ORDER — CEFAZOLIN SODIUM-DEXTROSE 2-4 GM/100ML-% IV SOLN
2.0000 g | INTRAVENOUS | Status: AC
  Administered 2019-09-29: 07:00:00 2 g via INTRAVENOUS

## 2019-09-29 MED ORDER — METOPROLOL SUCCINATE ER 50 MG PO TB24
50.0000 mg | ORAL_TABLET | Freq: Every day | ORAL | Status: DC
  Administered 2019-09-30 – 2019-11-18 (×49): 50 mg via ORAL
  Filled 2019-09-29 (×50): qty 1

## 2019-09-29 MED ORDER — ALBUTEROL SULFATE (2.5 MG/3ML) 0.083% IN NEBU: 3.0000 mL | INHALATION_SOLUTION | Freq: Four times a day (QID) | RESPIRATORY_TRACT | Status: DC | PRN

## 2019-09-29 MED ORDER — FENTANYL CITRATE (PF) 250 MCG/5ML IJ SOLN
INTRAMUSCULAR | Status: AC
  Filled 2019-09-29: qty 5

## 2019-09-29 MED ORDER — FUROSEMIDE 40 MG PO TABS
40.0000 mg | ORAL_TABLET | Freq: Two times a day (BID) | ORAL | Status: DC
  Administered 2019-09-29 – 2019-11-18 (×96): 40 mg via ORAL
  Filled 2019-09-29 (×95): qty 1

## 2019-09-29 MED ORDER — MIDAZOLAM HCL 5 MG/5ML IJ SOLN
INTRAMUSCULAR | Status: DC | PRN
  Administered 2019-09-29: 2 mg via INTRAVENOUS

## 2019-09-29 MED ORDER — ACETAMINOPHEN 160 MG/5ML PO SOLN: 1000.0000 mg | Freq: Once | ORAL | Status: DC | PRN

## 2019-09-29 MED ORDER — SODIUM CHLORIDE 0.9 % IV SOLN: INTRAVENOUS | Status: DC

## 2019-09-29 MED ORDER — POTASSIUM CHLORIDE CRYS ER 10 MEQ PO TBCR
10.0000 meq | EXTENDED_RELEASE_TABLET | Freq: Two times a day (BID) | ORAL | Status: DC
  Administered 2019-09-29 – 2019-11-18 (×98): 10 meq via ORAL
  Filled 2019-09-29 (×111): qty 1

## 2019-09-29 MED ORDER — SODIUM CHLORIDE 0.9 % IV SOLN
500.0000 mL | Freq: Once | INTRAVENOUS | Status: AC | PRN
  Administered 2019-10-20: 500 mL via INTRAVENOUS

## 2019-09-29 MED ORDER — INSULIN GLARGINE 100 UNIT/ML ~~LOC~~ SOLN
50.0000 [IU] | Freq: Every day | SUBCUTANEOUS | Status: DC
  Filled 2019-09-29 (×2): qty 0.5

## 2019-09-29 MED ORDER — FENTANYL CITRATE (PF) 100 MCG/2ML IJ SOLN
INTRAMUSCULAR | Status: AC
  Filled 2019-09-29: qty 2

## 2019-09-29 MED ORDER — HEMOSTATIC AGENTS (NO CHARGE) OPTIME
TOPICAL | Status: DC | PRN
  Administered 2019-09-29: 1 via TOPICAL

## 2019-09-29 MED ORDER — OXYCODONE HCL 5 MG PO TABS
ORAL_TABLET | ORAL | Status: AC
  Filled 2019-09-29: qty 1

## 2019-09-29 MED ORDER — PROTAMINE SULFATE 10 MG/ML IV SOLN
INTRAVENOUS | Status: DC | PRN
  Administered 2019-09-29: 50 mg via INTRAVENOUS

## 2019-09-29 MED ORDER — OXYCODONE-ACETAMINOPHEN 5-325 MG PO TABS
ORAL_TABLET | ORAL | Status: AC
  Filled 2019-09-29: qty 2

## 2019-09-29 MED ORDER — DEXAMETHASONE SODIUM PHOSPHATE 10 MG/ML IJ SOLN
INTRAMUSCULAR | Status: AC
  Filled 2019-09-29: qty 2

## 2019-09-29 MED ORDER — DOCUSATE SODIUM 100 MG PO CAPS
100.0000 mg | ORAL_CAPSULE | Freq: Every day | ORAL | Status: DC
  Administered 2019-09-30 – 2019-11-18 (×46): 100 mg via ORAL
  Filled 2019-09-29 (×49): qty 1

## 2019-09-29 MED ORDER — NITROFURANTOIN MONOHYD MACRO 100 MG PO CAPS
100.0000 mg | ORAL_CAPSULE | Freq: Every day | ORAL | Status: DC
  Administered 2019-09-29 – 2019-10-08 (×10): 100 mg via ORAL
  Filled 2019-09-29 (×11): qty 1

## 2019-09-29 MED ORDER — ACETAMINOPHEN 500 MG PO TABS: 1000.0000 mg | ORAL_TABLET | Freq: Once | ORAL | Status: DC | PRN

## 2019-09-29 MED ORDER — ACETAMINOPHEN 10 MG/ML IV SOLN
INTRAVENOUS | Status: AC
  Filled 2019-09-29: qty 100

## 2019-09-29 MED ORDER — MAGNESIUM SULFATE 2 GM/50ML IV SOLN: 2.0000 g | Freq: Every day | INTRAVENOUS | Status: DC | PRN

## 2019-09-29 MED ORDER — OXYCODONE-ACETAMINOPHEN 5-325 MG PO TABS
1.0000 | ORAL_TABLET | ORAL | Status: DC | PRN
  Administered 2019-09-29 – 2019-09-30 (×5): 2 via ORAL
  Administered 2019-10-01: 1 via ORAL
  Administered 2019-10-01 – 2019-10-09 (×20): 2 via ORAL
  Administered 2019-10-09: 1 via ORAL
  Administered 2019-10-11 – 2019-10-28 (×32): 2 via ORAL
  Filled 2019-09-29 (×52): qty 2
  Filled 2019-09-29: qty 1
  Filled 2019-09-29 (×5): qty 2

## 2019-09-29 MED ORDER — GABAPENTIN 600 MG PO TABS
1200.0000 mg | ORAL_TABLET | Freq: Every day | ORAL | Status: DC
  Administered 2019-09-29 – 2019-11-17 (×50): 1200 mg via ORAL
  Filled 2019-09-29 (×51): qty 2

## 2019-09-29 MED ORDER — LOSARTAN POTASSIUM 50 MG PO TABS
100.0000 mg | ORAL_TABLET | Freq: Every day | ORAL | Status: DC
  Administered 2019-09-29 – 2019-11-17 (×45): 100 mg via ORAL
  Filled 2019-09-29 (×50): qty 2

## 2019-09-29 MED ORDER — CEFAZOLIN SODIUM-DEXTROSE 2-4 GM/100ML-% IV SOLN
2.0000 g | Freq: Three times a day (TID) | INTRAVENOUS | Status: AC
  Administered 2019-09-29: 2 g via INTRAVENOUS
  Filled 2019-09-29 (×2): qty 100

## 2019-09-29 MED ORDER — PROPOFOL 10 MG/ML IV BOLUS
INTRAVENOUS | Status: AC
  Filled 2019-09-29: qty 20

## 2019-09-29 MED ORDER — GABAPENTIN 600 MG PO TABS
600.0000 mg | ORAL_TABLET | Freq: Three times a day (TID) | ORAL | Status: DC
  Administered 2019-09-29 – 2019-11-18 (×144): 600 mg via ORAL
  Filled 2019-09-29 (×143): qty 1

## 2019-09-29 MED ORDER — TAPENTADOL HCL 50 MG PO TABS
50.0000 mg | ORAL_TABLET | Freq: Four times a day (QID) | ORAL | Status: DC
  Administered 2019-09-29 – 2019-11-17 (×184): 50 mg via ORAL
  Filled 2019-09-29 (×186): qty 1

## 2019-09-29 MED ORDER — METOPROLOL TARTRATE 5 MG/5ML IV SOLN: 2.0000 mg | INTRAVENOUS | Status: DC | PRN

## 2019-09-29 MED ORDER — SODIUM CHLORIDE 0.9 % IV SOLN
INTRAVENOUS | Status: AC
  Administered 2019-09-29 – 2019-09-30 (×2): via INTRAVENOUS

## 2019-09-29 MED ORDER — GUAIFENESIN-DM 100-10 MG/5ML PO SYRP: 15.0000 mL | ORAL_SOLUTION | ORAL | Status: DC | PRN

## 2019-09-29 MED ORDER — ROCURONIUM BROMIDE 10 MG/ML (PF) SYRINGE
PREFILLED_SYRINGE | INTRAVENOUS | Status: AC
  Filled 2019-09-29: qty 10

## 2019-09-29 MED ORDER — DEXAMETHASONE SODIUM PHOSPHATE 10 MG/ML IJ SOLN
INTRAMUSCULAR | Status: AC
  Filled 2019-09-29: qty 1

## 2019-09-29 MED ORDER — PROPOFOL 10 MG/ML IV BOLUS
INTRAVENOUS | Status: DC | PRN
  Administered 2019-09-29: 140 mg via INTRAVENOUS

## 2019-09-29 MED ORDER — SODIUM CHLORIDE 0.9 % IV SOLN
INTRAVENOUS | Status: DC | PRN
  Administered 2019-09-29: 09:00:00 500 mL

## 2019-09-29 MED ORDER — HYDROMORPHONE HCL 1 MG/ML IJ SOLN
0.5000 mg | INTRAMUSCULAR | Status: DC | PRN
  Administered 2019-09-30 – 2019-10-02 (×4): 1 mg via INTRAVENOUS
  Administered 2019-10-02: 03:00:00 0.5 mg via INTRAVENOUS
  Administered 2019-10-03 – 2019-10-29 (×22): 1 mg via INTRAVENOUS
  Filled 2019-09-29 (×32): qty 1

## 2019-09-29 MED ORDER — ACETAMINOPHEN 10 MG/ML IV SOLN
1000.0000 mg | Freq: Once | INTRAVENOUS | Status: DC | PRN
  Administered 2019-09-29: 1000 mg via INTRAVENOUS

## 2019-09-29 MED ORDER — ACETAMINOPHEN 325 MG PO TABS
325.0000 mg | ORAL_TABLET | ORAL | Status: DC | PRN
  Administered 2019-09-29 – 2019-11-13 (×9): 650 mg via ORAL
  Filled 2019-09-29 (×10): qty 2

## 2019-09-29 MED ORDER — ESCITALOPRAM OXALATE 20 MG PO TABS
20.0000 mg | ORAL_TABLET | Freq: Every day | ORAL | Status: DC
  Administered 2019-09-30 – 2019-11-18 (×49): 20 mg via ORAL
  Filled 2019-09-29 (×49): qty 1

## 2019-09-29 MED ORDER — CHLORHEXIDINE GLUCONATE CLOTH 2 % EX PADS
6.0000 | MEDICATED_PAD | Freq: Every day | CUTANEOUS | Status: DC
  Administered 2019-09-29 – 2019-11-18 (×47): 6 via TOPICAL

## 2019-09-29 MED ORDER — METHOTREXATE 2.5 MG PO TABS
5.0000 mg | ORAL_TABLET | ORAL | Status: DC
  Administered 2019-10-05 (×2): 5 mg via ORAL
  Filled 2019-09-29 (×2): qty 2

## 2019-09-29 MED ORDER — GABAPENTIN 600 MG PO TABS: 600.0000 mg | ORAL_TABLET | ORAL | Status: DC

## 2019-09-29 MED ORDER — ISOSORBIDE MONONITRATE ER 30 MG PO TB24
30.0000 mg | ORAL_TABLET | Freq: Every day | ORAL | Status: DC
  Administered 2019-09-30 – 2019-11-18 (×49): 30 mg via ORAL
  Filled 2019-09-29 (×50): qty 1

## 2019-09-29 MED ORDER — ONDANSETRON HCL 4 MG/2ML IJ SOLN
INTRAMUSCULAR | Status: DC | PRN
  Administered 2019-09-29: 4 mg via INTRAVENOUS

## 2019-09-29 MED ORDER — MOMETASONE FURO-FORMOTEROL FUM 200-5 MCG/ACT IN AERO
2.0000 | INHALATION_SPRAY | Freq: Two times a day (BID) | RESPIRATORY_TRACT | Status: DC
  Administered 2019-09-29 – 2019-11-18 (×93): 2 via RESPIRATORY_TRACT
  Filled 2019-09-29 (×4): qty 8.8

## 2019-09-29 MED ORDER — ONDANSETRON 4 MG PO TBDP: 4.0000 mg | ORAL_TABLET | Freq: Three times a day (TID) | ORAL | Status: DC | PRN

## 2019-09-29 MED ORDER — OXYCODONE HCL ER 15 MG PO T12A
15.0000 mg | EXTENDED_RELEASE_TABLET | Freq: Two times a day (BID) | ORAL | Status: DC
  Administered 2019-09-29 – 2019-10-29 (×58): 15 mg via ORAL
  Filled 2019-09-29 (×58): qty 1

## 2019-09-29 MED ORDER — ACETAMINOPHEN 325 MG RE SUPP: 325.0000 mg | RECTAL | Status: DC | PRN

## 2019-09-29 MED ORDER — HYDRALAZINE HCL 20 MG/ML IJ SOLN: 5.0000 mg | INTRAMUSCULAR | Status: DC | PRN

## 2019-09-29 MED ORDER — LACTATED RINGERS IV SOLN
INTRAVENOUS | Status: DC | PRN
  Administered 2019-09-29 (×2): via INTRAVENOUS

## 2019-09-29 MED ORDER — LIDOCAINE 2% (20 MG/ML) 5 ML SYRINGE
INTRAMUSCULAR | Status: AC
  Filled 2019-09-29: qty 5

## 2019-09-29 MED ORDER — NORTRIPTYLINE HCL 10 MG PO CAPS
30.0000 mg | ORAL_CAPSULE | Freq: Every day | ORAL | Status: DC
  Administered 2019-09-29 – 2019-11-17 (×49): 30 mg via ORAL
  Filled 2019-09-29 (×54): qty 3

## 2019-09-29 MED ORDER — PHENOL 1.4 % MT LIQD
1.0000 | OROMUCOSAL | Status: DC | PRN
  Filled 2019-09-29: qty 177

## 2019-09-29 MED ORDER — 0.9 % SODIUM CHLORIDE (POUR BTL) OPTIME
TOPICAL | Status: DC | PRN
  Administered 2019-09-29: 09:00:00 1000 mL

## 2019-09-29 MED ORDER — FENTANYL CITRATE (PF) 100 MCG/2ML IJ SOLN
INTRAMUSCULAR | Status: DC | PRN
  Administered 2019-09-29: 100 ug via INTRAVENOUS
  Administered 2019-09-29 (×4): 50 ug via INTRAVENOUS

## 2019-09-29 MED ORDER — FLEET ENEMA 7-19 GM/118ML RE ENEM: 1.0000 | ENEMA | Freq: Once | RECTAL | Status: DC | PRN

## 2019-09-29 MED ORDER — MAGNESIUM OXIDE 400 (241.3 MG) MG PO TABS
400.0000 mg | ORAL_TABLET | Freq: Every day | ORAL | Status: DC
  Administered 2019-09-30 – 2019-11-18 (×49): 400 mg via ORAL
  Filled 2019-09-29 (×51): qty 1

## 2019-09-29 MED ORDER — DEXAMETHASONE SODIUM PHOSPHATE 10 MG/ML IJ SOLN
INTRAMUSCULAR | Status: DC | PRN
  Administered 2019-09-29: 4 mg via INTRAVENOUS

## 2019-09-29 MED ORDER — NITROGLYCERIN 0.4 MG SL SUBL: 0.4000 mg | SUBLINGUAL_TABLET | SUBLINGUAL | Status: DC | PRN

## 2019-09-29 MED ORDER — OXYCODONE HCL 5 MG/5ML PO SOLN: 5.0000 mg | Freq: Once | ORAL | Status: AC | PRN

## 2019-09-29 SURGICAL SUPPLY — 42 items
ADH SKN CLS APL DERMABOND .7 (GAUZE/BANDAGES/DRESSINGS) ×1
CANISTER SUCT 3000ML PPV (MISCELLANEOUS) ×3 IMPLANT
CLIP VESOCCLUDE MED 24/CT (CLIP) ×3 IMPLANT
CLIP VESOCCLUDE SM WIDE 24/CT (CLIP) ×3 IMPLANT
COVER PROBE W GEL 5X96 (DRAPES) ×2 IMPLANT
COVER WAND RF STERILE (DRAPES) ×3 IMPLANT
DERMABOND ADVANCED (GAUZE/BANDAGES/DRESSINGS) ×2
DERMABOND ADVANCED .7 DNX12 (GAUZE/BANDAGES/DRESSINGS) ×1 IMPLANT
DRAIN CHANNEL 15F RND FF W/TCR (WOUND CARE) IMPLANT
DRSG PAD ABDOMINAL 8X10 ST (GAUZE/BANDAGES/DRESSINGS) ×2 IMPLANT
ELECT REM PT RETURN 9FT ADLT (ELECTROSURGICAL) ×3
ELECTRODE REM PT RTRN 9FT ADLT (ELECTROSURGICAL) ×1 IMPLANT
EVACUATOR SILICONE 100CC (DRAIN) IMPLANT
GLOVE BIO SURGEON STRL SZ7.5 (GLOVE) ×3 IMPLANT
GLOVE BIOGEL PI IND STRL 6.5 (GLOVE) IMPLANT
GLOVE BIOGEL PI IND STRL 7.0 (GLOVE) IMPLANT
GLOVE BIOGEL PI INDICATOR 6.5 (GLOVE) ×2
GLOVE BIOGEL PI INDICATOR 7.0 (GLOVE) ×2
GLOVE ECLIPSE 6.5 STRL STRAW (GLOVE) ×2 IMPLANT
GLOVE ECLIPSE 7.5 STRL STRAW (GLOVE) ×2 IMPLANT
GOWN STRL REUS W/ TWL LRG LVL3 (GOWN DISPOSABLE) ×2 IMPLANT
GOWN STRL REUS W/ TWL XL LVL3 (GOWN DISPOSABLE) ×1 IMPLANT
GOWN STRL REUS W/TWL LRG LVL3 (GOWN DISPOSABLE) ×6
GOWN STRL REUS W/TWL XL LVL3 (GOWN DISPOSABLE) ×3
KIT BASIN OR (CUSTOM PROCEDURE TRAY) ×3 IMPLANT
KIT TURNOVER KIT B (KITS) ×3 IMPLANT
NS IRRIG 1000ML POUR BTL (IV SOLUTION) ×6 IMPLANT
PACK PERIPHERAL VASCULAR (CUSTOM PROCEDURE TRAY) ×3 IMPLANT
PAD ARMBOARD 7.5X6 YLW CONV (MISCELLANEOUS) ×6 IMPLANT
SURGICEL SNOW 2X4 (HEMOSTASIS) ×2 IMPLANT
SUT ETHILON 3 0 PS 1 (SUTURE) IMPLANT
SUT MNCRL AB 4-0 PS2 18 (SUTURE) ×5 IMPLANT
SUT PROLENE 5 0 C 1 24 (SUTURE) ×7 IMPLANT
SUT PROLENE 6 0 BV (SUTURE) ×3 IMPLANT
SUT VIC AB 2-0 CT1 27 (SUTURE) ×3
SUT VIC AB 2-0 CT1 TAPERPNT 27 (SUTURE) ×1 IMPLANT
SUT VIC AB 3-0 SH 27 (SUTURE) ×3
SUT VIC AB 3-0 SH 27X BRD (SUTURE) ×1 IMPLANT
TOWEL GREEN STERILE (TOWEL DISPOSABLE) ×6 IMPLANT
TOWEL GREEN STERILE FF (TOWEL DISPOSABLE) ×3 IMPLANT
UNDERPAD 30X30 (UNDERPADS AND DIAPERS) ×3 IMPLANT
WATER STERILE IRR 1000ML POUR (IV SOLUTION) ×3 IMPLANT

## 2019-09-29 NOTE — Transfer of Care (Signed)
Immediate Anesthesia Transfer of Care Note  Patient: Lauren [name]  Procedure(s) Performed: ENDARTERECTOMY FEMORAL with vein patch angioplast. (Right Groin)  Patient Location: PACU  Anesthesia Type:General  Level of Consciousness: drowsy and patient cooperative  Airway & Oxygen Therapy: Patient Spontanous Breathing and Patient connected to nasal cannula oxygen  Post-op Assessment: Report given to RN, Post -op Vital signs reviewed and stable and Patient moving all extremities  Post vital signs: Reviewed and stable  Last Vitals:  Vitals Value Taken Time  BP 173/78 09/29/19 1031  Temp    Pulse 124 09/29/19 1032  Resp 17 09/29/19 1032  SpO2 99 % 09/29/19 1032  Vitals shown include unvalidated device data.  Last Pain:  Vitals:   09/29/19 0651  TempSrc:   PainSc: 7       Patients Stated Pain Goal: 3 ([id number])  Complications: No apparent anesthesia complications

## 2019-09-29 NOTE — H&P (Signed)
Established Critical Limb Ischemia Patient   History of Present Illness   Lauren [name] is a 68 y.o. (11-30-1951) female with past medical history significant for hypertension, hyperlipidemia, insulin-dependent diabetes mellitus, severe COPD, cor pulmonale, and CAD who presents with chief complaint worsening pain R foot and great toe.  Surgical history significant for right carotid endarterectomy in 2016 by Dr. Kellie Simmering.  Patient is also had left SFA and popliteal stenting by Dr. Donzetta Matters in July 2019.  She describes over the past 2 months that she has had occasional rest pain of right foot which has been increasingly painful and more regular over the past 2 weeks.  She has bilateral foot drop and admittedly does not walk anymore.  She is however able to transfer from chair with some assist.  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  She is a former tobacco smoker.  It should also be noted that due to extensive history of back pain and spine surgery she is unable to lie flat and had a difficult time tolerating arterial duplex in office today.  She was sedated for aortogram in July 2019.        Current Outpatient Medications  Medication Sig Dispense Refill  . Albuterol Sulfate (PROAIR RESPICLICK) [id number] (90 Base) MCG/ACT AEPB Inhale 2 puffs into the lungs every 6 (six) hours as needed (congestion, cough, wheezing).     Marland Kitchen amLODipine (NORVASC) 5 MG tablet Take 1 tablet (5 mg total) by mouth daily. 30 tablet 0  . apixaban (ELIQUIS) 5 MG TABS tablet Take 5 mg by mouth 2 (two) times daily.    . betamethasone dipropionate (DIPROLENE) 0.05 % cream Apply 1 application topically 2 (two) times daily as needed (psoriasis).     . Cholecalciferol (VITAMIN D3) 1000 UNITS CAPS Take 1,000 Units by mouth daily.     Marland Kitchen escitalopram (LEXAPRO) 20 MG tablet Take 20 mg by mouth daily.    Marland Kitchen esomeprazole (NEXIUM) 40 MG capsule Take 40 mg by mouth daily at 8 pm.     . ferrous sulfate 325 (65 FE) MG  tablet Take 325 mg by mouth daily.     . Fluticasone-Salmeterol (ADVAIR) 250-50 MCG/DOSE AEPB Inhale 1 puff into the lungs 2 (two) times daily.     . folic acid (FOLVITE) 1 MG tablet Take 1 mg by mouth daily at 12 noon.     . furosemide (LASIX) 40 MG tablet Take 40 mg by mouth daily.     Marland Kitchen gabapentin (NEURONTIN) 600 MG tablet Take 600 mg by mouth 3 (three) times daily. 0800, 1600, 0000    . insulin glargine (LANTUS) 100 UNIT/ML injection Inject 80 Units into the skin daily at 12 noon.     . Insulin Glargine-Lixisenatide (SOLIQUA) 100-33 UNT-MCG/ML SOPN Inject 60 Units as directed as directed.    . isosorbide mononitrate (IMDUR) 30 MG 24 hr tablet Take 1 tablet by mouth once daily 90 tablet 1  . losartan (COZAAR) 100 MG tablet Take 50 mg by mouth daily at 3 pm.    . magnesium oxide (MAG-OX) 400 (241.3 Mg) MG tablet Take 1 tablet by mouth daily.    . magnesium oxide (MAG-OX) 400 MG tablet Take 400 mg by mouth daily.    . Melatonin 10 MG TABS Take 10 mg by mouth daily at 8 pm.     . metaxalone (SKELAXIN) 800 MG tablet Take 800 mg by mouth 3 (three) times daily. 0800, 1200, 1600    .  methotrexate 2.5 MG tablet Take 5-7.5 tablets by mouth See admin instructions. Take 7.5mg  by mouth in the morning and 5mg  in the evening once weekly on Wednesday  3  . metoprolol succinate (TOPROL-XL) 50 MG 24 hr tablet Take 50 mg by mouth daily.    . Multiple Vitamin (MULTIVITAMIN WITH MINERALS) TABS tablet Take 1 tablet by mouth daily.    . nitrofurantoin, macrocrystal-monohydrate, (MACROBID) 100 MG capsule Take 100 mg by mouth every evening.    . nitroGLYCERIN (NITROSTAT) 0.4 MG SL tablet Place 1 tablet (0.4 mg total) under the tongue every 5 (five) minutes as needed for chest pain. 25 tablet 3  . nortriptyline (PAMELOR) 10 MG capsule Take 10 mg by mouth at bedtime. (0000)    . Nortriptyline HCl (NORTRIPYTLINE HCL PO) Take 10 mg by mouth.     Marland Kitchen omeprazole (PRILOSEC) 40 MG capsule  Take 40 mg by mouth daily at 8 pm.    . ondansetron (ZOFRAN-ODT) 4 MG disintegrating tablet Take 4 mg by mouth every 8 (eight) hours as needed for nausea or vomiting.     . OXYCONTIN 15 MG 12 hr tablet Take 1 tablet (15 mg total) by mouth every 12 (twelve) hours. 4 tablet 0  . potassium chloride (MICRO-K) 10 MEQ CR capsule Take 10 mEq by mouth 2 (two) times daily.    . potassium chloride SA (K-DUR,KLOR-CON) 20 MEQ tablet Take 10 mEq by mouth 2 (two) times daily. 1200 & 2000    . promethazine (PHENERGAN) 25 MG tablet Take 12.5-25 mg by mouth every 6 (six) hours as needed for nausea.     Marland Kitchen RELION INSULIN SYRINGE 1ML/31G 31G X 5/16" 1 ML MISC See admin instructions.    . rosuvastatin (CRESTOR) 5 MG tablet Take 5 mg by mouth daily at 8 pm.     . senna (SENOKOT) 8.6 MG tablet Take 1 tablet by mouth as needed for constipation.     . tapentadol (NUCYNTA) 50 MG tablet Take 1 tablet (50 mg total) by mouth every 6 (six) hours. 6 am, noon, 6pm  And midnight (Patient taking differently: Take 50 mg by mouth every 6 (six) hours. 0800, 1200, 1800, 0000) 8 tablet 0  . vitamin B-12 (CYANOCOBALAMIN) 1000 MCG tablet Take 1,000 mcg by mouth daily.              Current Facility-Administered Medications  Medication Dose Route Frequency Provider Last Rate Last Dose  . ibuprofen (ADVIL) tablet 600 mg  600 mg Oral Q6H PRN Wallene Huh, DPM        On ROS today: 10 system ROS is negative unless otherwise noted in HPI   Physical Examination   Vitals:   09/29/19 0553  BP: 140/62  Pulse: 90  Resp: 20  Temp: 98.7 F (37.1 C)  SpO2: 99%     General Alert, O x 3, WD, NAD  Pulmonary Sym exp, good B air movt, CTA B  Cardiac RRR, Nl S1, S2  Vascular Vessel Right Left  Radial Palpable Palpable  Aorta Not palpable N/A  Femoral Not palpable Faintly palpable  Popliteal Not palpable Not palpable  PT Not palpable Not palpable  DP Not palpable Not palpable    Gastro- intestinal soft,  non-distended, non-tender to palpation,   Musculo- skeletal M/S 5/5 throughout  , dependent rubor, dusky appearing toes R foot  Neurologic Cranial nerves 2-12 intact , Pain and light touch intact in extremities , Motor exam as listed above    Non-Invasive Vascular  Imaging   Arterial duplex demonstrating monophasic flow throughout bilateral lower extremities.  Dampened waveforms without unattainable ABI right leg.  Left ABI 0.47   Medical Decision Making   Lauren [name] is a 68 y.o. female who presents with: right lower extremity critical limb ischemia with rest pain and early tissue changes.   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 Plan for right cfea. Discussed risks, benefits, alternatives particularly wound issues and she agrees to proceed.   Paidyn Mcferran C. Donzetta Matters, MD [facility] Office: [phone] Pager: [phone]

## 2019-09-29 NOTE — Anesthesia Procedure Notes (Signed)
Procedure Name: Intubation Date/Time: 09/29/2019 7:44 AM Performed by: Moshe Salisbury, CRNA Pre-anesthesia Checklist: Patient identified, Emergency Drugs available, Suction available and Patient being monitored Patient Re-evaluated:Patient Re-evaluated prior to induction Oxygen Delivery Method: Circle System Utilized Preoxygenation: Pre-oxygenation with 100% oxygen Induction Type: IV induction Ventilation: Mask ventilation without difficulty Laryngoscope Size: Mac and 3 Grade View: Grade II Tube type: Oral Tube size: 7.5 mm Number of attempts: 1 Airway Equipment and Method: Stylet Placement Confirmation: ETT inserted through vocal cords under direct vision,  positive ETCO2 and breath sounds checked- equal and bilateral Secured at: 22 cm Tube secured with: Tape Dental Injury: Teeth and Oropharynx as per pre-operative assessment

## 2019-09-29 NOTE — Progress Notes (Signed)
Pt received from PACU. Pt given chg bath. Foley/Peri care performed. Vitals stable. CCMD notified/telebox 8 applied. Pt oriented to room and call bell within reach.  Will continue to monitor.  Jerald Kief, RN

## 2019-09-29 NOTE — Op Note (Signed)
Patient name: Lauren [name] MRN: [id number] DOB: May 06, 1951 Sex: female  09/29/2019 Pre-operative Diagnosis: Critical right lower extremity ischemia with common femoral artery occlusion Post-operative diagnosis:  Same Surgeon:  Erlene Quan C. Donzetta Matters, MD Assistant: Laurence Slate, PA Procedure Performed: 1.  Harvest right greater saphenous vein 2.  Right external iliac, common femoral, profunda femoral and SFA endarterectomies with vein patch angioplasty  Indications: 68 year old female has had previous left SFA stenting.  She now has a toe pressure of 0 on the right is undergone angiography with a 40 mmHg pressure gradient across her common femoral artery.  It appears to be subtotally occluded.  She is indicated for common femoral endarterectomy.  Findings: Common femoral artery was heavily calcified extending up into the external iliac artery.  There was subacute appearing thrombus in the common femoral artery it was fully occluded.  There is also heavy calcification extending down to the profunda and the takeoff of the SFA.  After extensive endarterectomy we had good backbleeding from the SFA and profunda.  The saphenous vein was somewhat sclerotic but was long enough to be used as a patch given her high risk of infection I elected to use it.  At completion she did have a week PT signal.  She had good signals in her SFA proximally and profunda femoris proximally.   Procedure:  The patient was identified in the holding area and taken to the operating room where she is placed upon operative.  She was sterilely prepped draped in the right groin there was difficulty getting exposure ultimately her hernia was taped cephalad.  She was sterilely prepped and draped in the groin and the right lower extremity antibiotics administered timeout was called.  I used ultrasound to identify her common femoral artery and vein.  I made a initial longitudinal incision dissected down medial to the sartorius muscle use  ultrasound again to identify the common femoral artery.  I was able to find this with electrocautery.  There was extensive scarring in the groin likely from previous closure device.  I extended the incision cephalad and a curvilinear incision fashion.  We then dissected up onto the inguinal ligament identified the external iliac artery and placed a vessel loop around this as well as 1 of the side branches.  I did have to divide one side branch.  There was a soft area for clamping.  I dissected down to the profunda and SFA and placed Vesseloops around this.  In the same incision identified first and anterior saphenous vein followed by great saphenous vein dissected out these for several centimeters.  10,000 units of heparin was administered.  We clamped the outflow followed by the inflow.  We performed extensive arteriotomy followed by extensive endarterectomy.  Distally we had good endpoint on the SFA.  We good backbleeding from the profunda.  We also had good inflow from the external.  The artery was healthy appearing.  We then spatulated our vein.  We reversed it and sewed it in place as a patch with 5-0 Prolene suture.  Prior to completion we allowed flushing all directions only with heparinized saline.  Upon completion there was 1 area needed for repair.  We did have a good signal in the SFA and the profunda.  50 mg of protamine was administered.  We had good signal at the posterior tibial at the ankle.  We thoroughly irrigated the wound.  We closed in layers with 2-0 Vicryl followed by 3-0 Vicryl and 4 Monocryl.  Dermabond  is placed to the level the skin.  She was awakened anesthesia having tolerated procedure without any complication.  All counts were correct at completion.  EBL: 200 cc    Randie Bloodgood C. Donzetta Matters, MD [facility] Office: [phone] Pager: [phone]

## 2019-09-29 NOTE — Anesthesia Preprocedure Evaluation (Addendum)
Anesthesia Evaluation  Patient identified by MRN, date of birth, ID band Patient awake    Reviewed: Allergy & Precautions, NPO status , Patient's Chart, lab work & pertinent test results, reviewed documented beta blocker date and time   History of Anesthesia Complications Negative for: history of anesthetic complications  Airway Mallampati: II  TM Distance: >3 FB Neck ROM: Full    Dental  (+) Teeth Intact, Dental Advisory Given,    Pulmonary shortness of breath, COPD,  COPD inhaler, former smoker,     + wheezing      Cardiovascular hypertension, Pt. on medications and Pt. on home beta blockers + angina + CAD, + Peripheral Vascular Disease, +CHF and + DOE   Rhythm:Regular     Neuro/Psych Anxiety Depression  Neuromuscular disease    GI/Hepatic Neg liver ROS, GERD  Medicated,  Endo/Other  diabetesMorbid obesity  Renal/GU Renal disease     Musculoskeletal  (+) Arthritis ,   Abdominal   Peds  Hematology  (+) anemia ,   Anesthesia Other Findings Study Conclusions  - Left ventricle: The cavity size was normal. Wall thickness was   increased in a pattern of mild LVH. The estimated ejection   fraction was 55%. Wall motion was normal; there were no regional   wall motion abnormalities. Features are consistent with a   pseudonormal left ventricular filling pattern, with concomitant   abnormal relaxation and increased filling pressure (grade 2   diastolic dysfunction). - Aortic valve: There was no stenosis. - Mitral valve: Mildly calcified annulus. There was no significant   regurgitation. - Right ventricle: The cavity size was normal. Systolic function   was normal. - Tricuspid valve: Peak RV-RA gradient (S): 28 mm Hg. - Pulmonary arteries: PA peak pressure: 36 mm Hg (S). - Systemic veins: IVC measured 1.7 cm with < 50% respirophasic   variation, suggesting RA pressure 8 mmHg. - Impressions: Normal LV size with mild  LV hypertrophy. EF 55%.   Moderate diastolic dysfunction. Normal RV size and systolic   function. No significant valvular abnormalities. Mild pulmonary  Study Highlights   The left ventricular ejection fraction is mildly decreased (45-54%).  Nuclear stress EF: 49%.  There was no ST segment deviation noted during stress.  Defect 1: There is a small fixed defect of mild severity present in the basal anterior and basal anteroseptal location.  This is a low risk study.    Reproductive/Obstetrics                            Anesthesia Physical Anesthesia Plan  ASA: III  Anesthesia Plan: General   Post-op Pain Management:    Induction: Intravenous  PONV Risk Score and Plan: 3 and Ondansetron, Dexamethasone and Midazolam  Airway Management Planned: Oral ETT  Additional Equipment: None  Intra-op Plan:   Post-operative Plan: Extubation in OR  Informed Consent: I have reviewed the patients History and Physical, chart, labs and discussed the procedure including the risks, benefits and alternatives for the proposed anesthesia with the patient or authorized representative who has indicated his/her understanding and acceptance.     Dental advisory given  Plan Discussed with: CRNA, Anesthesiologist and Surgeon  Anesthesia Plan Comments:        Anesthesia Quick Evaluation

## 2019-09-30 ENCOUNTER — Encounter (HOSPITAL_COMMUNITY): Payer: Medicare Other

## 2019-09-30 ENCOUNTER — Encounter (HOSPITAL_COMMUNITY): Payer: Self-pay | Admitting: Vascular Surgery

## 2019-09-30 ENCOUNTER — Inpatient Hospital Stay (HOSPITAL_COMMUNITY): Payer: Medicare Other

## 2019-09-30 ENCOUNTER — Ambulatory Visit: Payer: Medicare Other | Admitting: Vascular Surgery

## 2019-09-30 DIAGNOSIS — Z9889 Other specified postprocedural states: Secondary | ICD-10-CM | POA: Diagnosis not present

## 2019-09-30 LAB — GLUCOSE, CAPILLARY
Glucose-Capillary: 163 mg/dL — ABNORMAL HIGH (ref 70–99)
Glucose-Capillary: 172 mg/dL — ABNORMAL HIGH (ref 70–99)
Glucose-Capillary: 210 mg/dL — ABNORMAL HIGH (ref 70–99)
Glucose-Capillary: 241 mg/dL — ABNORMAL HIGH (ref 70–99)

## 2019-09-30 LAB — BASIC METABOLIC PANEL
Anion gap: 12 (ref 5–15)
BUN: 21 mg/dL (ref 8–23)
CO2: 26 mmol/L (ref 22–32)
Calcium: 8.5 mg/dL — ABNORMAL LOW (ref 8.9–10.3)
Chloride: 96 mmol/L — ABNORMAL LOW (ref 98–111)
Creatinine, Ser: 1.15 mg/dL — ABNORMAL HIGH (ref 0.44–1.00)
GFR calc Af Amer: 56 mL/min — ABNORMAL LOW (ref >60)
GFR calc non Af Amer: 49 mL/min — ABNORMAL LOW (ref >60)
Glucose, Bld: 209 mg/dL — ABNORMAL HIGH (ref 70–99)
Potassium: 3.5 mmol/L (ref 3.5–5.1)
Sodium: 134 mmol/L — ABNORMAL LOW (ref 135–145)

## 2019-09-30 LAB — CBC
HCT: 29.1 % — ABNORMAL LOW (ref 36.0–46.0)
Hemoglobin: 8.8 g/dL — ABNORMAL LOW (ref 12.0–15.0)
MCH: 27.3 pg (ref 26.0–34.0)
MCHC: 30.2 g/dL (ref 30.0–36.0)
MCV: 90.4 fL (ref 80.0–100.0)
Platelets: 454 10*3/uL — ABNORMAL HIGH (ref 150–400)
RBC: 3.22 MIL/uL — ABNORMAL LOW (ref 3.87–5.11)
RDW: 17.7 % — ABNORMAL HIGH (ref 11.5–15.5)
WBC: 11.3 10*3/uL — ABNORMAL HIGH (ref 4.0–10.5)
nRBC: 0 % (ref 0.0–0.2)

## 2019-09-30 MED ORDER — INSULIN GLARGINE 100 UNIT/ML ~~LOC~~ SOLN
25.0000 [IU] | Freq: Every day | SUBCUTANEOUS | Status: DC
  Administered 2019-09-30 – 2019-10-03 (×4): 25 [IU] via SUBCUTANEOUS
  Filled 2019-09-30 (×4): qty 0.25

## 2019-09-30 NOTE — Anesthesia Postprocedure Evaluation (Signed)
Anesthesia Post Note  Patient: Lauren [name]  Procedure(s) Performed: ENDARTERECTOMY FEMORAL with vein patch angioplast. (Right Groin)     Patient location during evaluation: PACU Anesthesia Type: General Level of consciousness: awake and alert Pain management: pain level controlled Vital Signs Assessment: post-procedure vital signs reviewed and stable Respiratory status: spontaneous breathing, nonlabored ventilation, respiratory function stable and patient connected to nasal cannula oxygen Cardiovascular status: blood pressure returned to baseline and stable Postop Assessment: no apparent nausea or vomiting Anesthetic complications: no    Last Vitals:  Vitals:   09/30/19 0843 09/30/19 1314  BP:  130/72  Pulse: 91 90  Resp: 20   Temp:    SpO2:      Last Pain:  Vitals:   09/30/19 1234  TempSrc:   PainSc: 3                  Liora Myles

## 2019-09-30 NOTE — Evaluation (Signed)
Occupational Therapy Evaluation Patient Details Name: Lauren [name] MRN: [id number] DOB: 1951-11-12 Today's Date: 09/30/2019    History of Present Illness Lauren [name] is a 68 y.o. (05-19-1951) female with past medical history significant for hypertension, hyperlipidemia, insulin-dependent diabetes mellitus, severe COPD, cor pulmonale, and CAD who presents with chief complaint worsening pain R foot and great toe.  pt to OR for SFA endarterectomies   Clinical Impression   Pt admitted with above diagnoses, presenting with limitations 2/2 pain with RLE and decreased activity tolerance and strength limiting ability to engage in BADL at desired level of ind. PTA pt living with husband who assisted with transfers from recliner <> w/c <> toilet only. She had a HHA and HHRN who assisted a few times a week. She states she has not been able to get in and out of her handicap accessible shower lately with her R foot being sore. At time of eval, she is a max A +2 for bed mobility and stand pivot transfers to chair. Pt severely limited by RLE pain at this time. Given current functional status, recommend SNF at d/c for safe d/c prior to returning home. Pt may refuse this option. Pt will benefit from acute OT per POC listed below.     Follow Up Recommendations  SNF;Supervision/Assistance - 24 hour    Equipment Recommendations  Other (comment)(TBD)    Recommendations for Other Services       Precautions / Restrictions Precautions Precautions: Fall Precaution Comments: right leg pain and tenderness to touch Restrictions Weight Bearing Restrictions: No Other Position/Activity Restrictions: pt able to stand only with left shoe and orthotic safely      Mobility Bed Mobility Overal bed mobility: Needs Assistance Bed Mobility: Supine to Sit     Supine to sit: +2 for physical assistance;Max assist     General bed mobility comments: pt initiating scooting of BLEs to EOB; max A at trunk and to  scoot hips EOB  Transfers Overall transfer level: Needs assistance Equipment used: None Transfers: Sit to/from [facility] Sit to Stand: Mod assist;+2 physical assistance Stand pivot transfers: Max assist;+2 physical assistance       General transfer comment: stand pivot transfer with max A +2 completed 2/2 pt anxiety of R foot touching the floor. Somewhat resistive during transfer    Balance Overall balance assessment: Needs assistance   Sitting balance-Leahy Scale: Fair Sitting balance - Comments: pt sat EOB with left foot on the floor with sacral sitting posture     Standing balance-Leahy Scale: Zero Standing balance comment: pt stood with max of 2 - weight on left leg to pivot to left                           ADL either performed or assessed with clinical judgement   ADL Overall ADL's : Needs assistance/impaired Eating/Feeding: Set up;Sitting   Grooming: Set up;Sitting   Upper Body Bathing: Minimal assistance;Sitting   Lower Body Bathing: Total assistance;Sit to/from stand;Sitting/lateral leans   Upper Body Dressing : Set up;Sitting   Lower Body Dressing: Total assistance;Sitting/lateral leans;Sit to/from stand   Toilet Transfer: Total assistance;Squat-pivot;BSC   Toileting- Clothing Manipulation and Hygiene: Total assistance       Functional mobility during ADLs: Maximal assistance;+2 for physical assistance;+2 for safety/equipment(SPT only) General ADL Comments: severely ltd 2/2 pain with RLE when transferring/engaging in dynamic BADL     Vision Patient Visual Report: No change from baseline  Perception     Praxis      Pertinent Vitals/Pain Pain Assessment: Faces Faces Pain Scale: Hurts worst Pain Location: right leg Pain Descriptors / Indicators: Aching;Burning;Discomfort;Pressure;Throbbing;Tightness Pain Intervention(s): Limited activity within patient's tolerance;Monitored during session;Repositioned     Hand  Dominance Right   Extremity/Trunk Assessment Upper Extremity Assessment Upper Extremity Assessment: Overall WFL for tasks assessed   Lower Extremity Assessment Lower Extremity Assessment: Defer to PT evaluation RLE Deficits / Details: leg is red and swollen and painful to touch.  Pt sitting EOB did long arc quads as able x 10 reps - 70 degrees flexion to -30 from full extension.  pts ankle in PF - pt unable to tolerate any PROM.  pt able to do 10 degrees of right akle movement LLE Deficits / Details: pts left leg is stiff too.  pts ankle in PF - able to get to 40 degrees from neutral - pt has shoes that fit this deformity - wtih double uprights.  Pt hurts to do AROM heel slides in bed.  Pt   Cervical / Trunk Assessment Cervical / Trunk Assessment: Kyphotic   Communication Communication Communication: No difficulties   Cognition Arousal/Alertness: Awake/alert Behavior During Therapy: Restless;Anxious Overall Cognitive Status: Within Functional Limits for tasks assessed                                 General Comments: Pt not wanting to get up - but knows she needs to.  pt  understanding the need for assessment to see what her DC plans might be   General Comments  Pt educated on floating heels and importance of not sliding skin.  pt encouraged to do AROM as able with right leg to help increase circulation    Exercises Total Joint Exercises Ankle Circles/Pumps: AROM;10 reps;Supine;Both Long Arc Quad: AROM;5 reps;Seated;Both   Shoulder Instructions      Home Living Family/patient expects to be discharged to:: Skilled nursing facility                                 Additional Comments: pt was home with husbands assist and paid caregiver 6 hours four days a week      Prior Functioning/Environment Level of Independence: Needs assistance  Gait / Transfers Assistance Needed: uses w/c at baseline, sleeps in recliner and goes from recliner to w/c to toilet  only ADL's / Homemaking Assistance Needed: BR door widened, more difficulty getting in tub lately   Comments: pt has had recent [facility] - they stopped as pt hurting too much and not progressing.  pt has been to a SNF before and not wanting to go back        OT Problem List: Decreased strength;Decreased knowledge of use of DME or AE;Obesity;Decreased activity tolerance;Impaired balance (sitting and/or standing);Pain      OT Treatment/Interventions: Self-care/ADL training;Therapeutic exercise;Patient/family education;Balance training;Energy conservation;Therapeutic activities;DME and/or AE instruction    OT Goals(Current goals can be found in the care plan section) Acute Rehab OT Goals Patient Stated Goal: I want to go home with my husband OT Goal Formulation: With patient Time For Goal Achievement: 10/14/19 Potential to Achieve Goals: Good  OT Frequency: Min 2X/week   Barriers to D/C:            Co-evaluation PT/OT/SLP Co-Evaluation/Treatment: Yes Reason for Co-Treatment: Complexity of the patient's impairments (multi-system involvement);For patient/therapist safety  PT goals addressed during session: Mobility/safety with mobility;Balance OT goals addressed during session: ADL's and self-care      AM-PAC OT "6 Clicks" Daily Activity     Outcome Measure Help from another person eating meals?: None Help from another person taking care of personal grooming?: A Little Help from another person toileting, which includes using toliet, bedpan, or urinal?: A Lot Help from another person bathing (including washing, rinsing, drying)?: A Lot Help from another person to put on and taking off regular upper body clothing?: A Little Help from another person to put on and taking off regular lower body clothing?: A Lot 6 Click Score: 16   End of Session Equipment Utilized During Treatment: Gait belt Nurse Communication: Mobility status;Need for lift equipment  Activity Tolerance: Patient  limited by pain Patient left: in chair;with call bell/phone within reach  OT Visit Diagnosis: Other abnormalities of gait and mobility (R26.89);Muscle weakness (generalized) (M62.81);Pain Pain - Right/Left: Right Pain - part of body: Leg                Time: [id number] OT Time Calculation (min): 45 min Charges:  OT General Charges $OT Visit: 1 Visit OT Evaluation $OT Eval Moderate Complexity: 1 Haskins, MSOT, OTR/L [employer] OT/ Acute Relief OT [facility] Office: [phone]   Zenovia Jarred 09/30/2019, 2:17 PM

## 2019-09-30 NOTE — Progress Notes (Signed)
[name] 09/30/2019 10:30 AM

## 2019-09-30 NOTE — Evaluation (Addendum)
Addendum -   I met with pts husband this afternoon.  We talked about pt's current mobility status and DC plans.  Husband to come in tomorrow between 1-4 and PT will work with him on getting pt OOB.  Husband will then say if he can or cannot care for pt at home as she is now.  (SNF care versus HH).   I showed husband how to help with left foot DF stretches - to keep ROM so pt can wear her adapted shoe.  We also discussed equipment - pt will benefit from right elevating leg rest on her wheelchair to keep foot from hanging down.  Talked with pt and husband for 15 minutes and answered questions.  Pts right leg less red than it was this morning.  Physical Therapy Evaluation Patient Details Name: Lauren [name] MRN: [id number] DOB: 01-25-1951 Today's Date: 09/30/2019   History of Present Illness  Lauren [name] is a 68 y.o. (1951/07/23) female with past medical history significant for hypertension, hyperlipidemia, insulin-dependent diabetes mellitus, severe COPD, cor pulmonale, and CAD who presents with chief complaint worsening pain R foot and great toe.  pt to OR for SFA endarterectomies    Clinical Impression  Pt not wanting to move.  Pt agreed with encouragement.  Pt +2 max assist to transfer to chair with stand pivot to left.  She has pain in right leg and she is afraid of falling.  Pt wanting to go home with husbands assist.  I feel like SNF stay prior to home would be helpful to pt to get stronger - as she is +2 assist transfer at this time.  Pt has had HH in the past.  Pt has left shoe orthotic - to fit her left foot deformity.  Pt has not been walking for a long time - spends day at home in recliner or wheelchair.  Her transfers and her right leg pain are her limiting factors at this time.  Pt has used sliding board in the past - not wanting to now as bottom hurts too. PT will continue to work with her while she is in the hospital - to progress her UE strength and mobilty.    Follow Up  Recommendations SNF    Equipment Recommendations  None recommended by PT    Recommendations for Other Services       Precautions / Restrictions Precautions Precautions: Fall Precaution Comments: right leg pain and tenderness to touch Restrictions Weight Bearing Restrictions: No Other Position/Activity Restrictions: pt able to stand only with left shoe and orthotic safely      Mobility  Bed Mobility Overal bed mobility: Needs Assistance Bed Mobility: Supine to Sit     Supine to sit: +2 for physical assistance;Max assist     General bed mobility comments: pt abel to help scoot each leg to EOB - reviewed importance of not sliding legs but lifting and moving to help skin.  Pt needed a lot of assist to get trunk elevated from bed. p t then unabel to scoot EOB - hurts too much on bottom she says  Transfers Overall transfer level: Needs assistance Equipment used: None Transfers: Sit to/from [facility] Sit to Stand: Mod assist;+2 physical assistance Stand pivot transfers: Max assist;+2 physical assistance       General transfer comment: Pt stood with assist from high bed -she didnt want to lean forward to get up onto foot.  Pt transfered to left side - keeping right leg out of  the way.  pt max assist of 2 to pivot to chair -she was afraid of falling and resisting the transfer  Ambulation/Gait             General Gait Details: unable - hasnt walked for a long time  Stairs            Wheelchair Mobility    Modified Rankin (Stroke Patients Only)       Balance Overall balance assessment: Needs assistance   Sitting balance-Leahy Scale: Fair Sitting balance - Comments: pt sat EOB with left foot on the floor with sacral sitting posture     Standing balance-Leahy Scale: Zero Standing balance comment: pt stood with max of 2 - weight on left leg to pivot to left                             Pertinent Vitals/Pain Pain Assessment:  Faces Faces Pain Scale: Hurts worst Pain Location: right leg Pain Descriptors / Indicators: Aching;Burning;Discomfort;Pressure;Throbbing;Tightness Pain Intervention(s): Limited activity within patient's tolerance;Monitored during session;Repositioned    Home Living Family/patient expects to be discharged to:: Skilled nursing facility                 Additional Comments: pt was home with husbands assist and paid caregiver 6 hours four days a week    Prior Function Level of Independence: Needs assistance   Gait / Transfers Assistance Needed: uses w/c at baseline, sleeps in recliner and goes from recliner to w/c to toilet only  ADL's / Homemaking Assistance Needed: BR door widened, more difficulty getting in tub lately  Comments: pt has had recent [facility] - they stopped as pt hurting too much and not progressing.  pt has been to a SNF before and not wanting to go back     Hand Dominance   Dominant Hand: Right    Extremity/Trunk Assessment   Upper Extremity Assessment Upper Extremity Assessment: Overall WFL for tasks assessed    Lower Extremity Assessment Lower Extremity Assessment: Defer to PT evaluation RLE Deficits / Details: leg is red and swollen and painful to touch.  Pt sitting EOB did long arc quads as able x 10 reps - 70 degrees flexion to -30 from full extension.  pts ankle in PF - pt unable to tolerate any PROM.  pt able to do 10 degrees of right akle movement LLE Deficits / Details: pts left leg is stiff too.  pts ankle in PF - able to get to 40 degrees from neutral - pt has shoes that fit this deformity - wtih double uprights.  Pt hurts to do AROM heel slides in bed.  Pt    Cervical / Trunk Assessment Cervical / Trunk Assessment: Kyphotic  Communication   Communication: No difficulties  Cognition Arousal/Alertness: Awake/alert Behavior During Therapy: Restless;Anxious Overall Cognitive Status: Within Functional Limits for tasks assessed                                  General Comments: Pt not wanting to get up - but knows she needs to.  pt  understanding the need for assessment to see what her DC plans might be      General Comments General comments (skin integrity, edema, etc.): Pt educated on floating heels and importance of not sliding skin.  pt encouraged to do AROM as able with right leg to help increase  circulation    Exercises Total Joint Exercises Ankle Circles/Pumps: AROM;10 reps;Supine;Both Long Arc Quad: AROM;5 reps;Seated;Both   Assessment/Plan    PT Assessment Patient needs continued PT services  PT Problem List Decreased strength;Decreased mobility;Decreased safety awareness;Decreased range of motion;Decreased knowledge of precautions;Decreased activity tolerance;Cardiopulmonary status limiting activity;Decreased skin integrity;Decreased balance;Decreased knowledge of use of DME;Impaired sensation;Pain       PT Treatment Interventions DME instruction;Therapeutic activities;Therapeutic exercise;Patient/family education;Balance training;Wheelchair mobility training;Functional mobility training    PT Goals (Current goals can be found in the Care Plan section)  Acute Rehab PT Goals Patient Stated Goal: I want to go home with my husband PT Goal Formulation: With patient Time For Goal Achievement: 10/14/19 Potential to Achieve Goals: Fair    Frequency Min 3X/week   Barriers to discharge Decreased caregiver support pt needs +2 for OOB to chair    Co-evaluation PT/OT/SLP Co-Evaluation/Treatment: Yes Reason for Co-Treatment: Complexity of the patient's impairments (multi-system involvement);For patient/therapist safety PT goals addressed during session: Mobility/safety with mobility;Balance OT goals addressed during session: ADL's and self-care       AM-PAC PT "6 Clicks" Mobility  Outcome Measure Help needed turning from your back to your side while in a flat bed without using bedrails?: A Lot Help  needed moving from lying on your back to sitting on the side of a flat bed without using bedrails?: A Lot Help needed moving to and from a bed to a chair (including a wheelchair)?: Total Help needed standing up from a chair using your arms (e.g., wheelchair or bedside chair)?: Total Help needed to walk in hospital room?: Total Help needed climbing 3-5 steps with a railing? : Total 6 Click Score: 8    End of Session Equipment Utilized During Treatment: Gait belt Activity Tolerance: Patient limited by pain;Treatment limited secondary to agitation Patient left: in chair;with call bell/phone within reach Nurse Communication: Need for lift equipment PT Visit Diagnosis: Muscle weakness (generalized) (M62.81);Unsteadiness on feet (R26.81);Pain Pain - Right/Left: Right Pain - part of body: Leg    Time: 0940-1020 PT Time Calculation (min) (ACUTE ONLY): 40 min   Charges:   PT Evaluation $PT Eval Moderate Complexity: 1 Mod PT Treatments $Therapeutic Activity: 8-22 mins        09/30/2019   Rande Lawman, PT   Loyal Buba 09/30/2019, 1:38 PM

## 2019-09-30 NOTE — Progress Notes (Signed)
Inpatient Diabetes Program Recommendations  AACE/ADA: New Consensus Statement on Inpatient Glycemic Control (2015)  Target Ranges:  Prepandial:   less than 140 mg/dL      Peak postprandial:   less than 180 mg/dL (1-2 hours)      Critically ill patients:  140 - 180 mg/dL   Lab Results  Component Value Date   GLUCAP 163 (H) 09/30/2019   HGBA1C 9.4 (H) 09/13/2019    Review of Glycemic Control Results for Lauren [name], Lauren [name] (MRN [id number]) as of 09/30/2019 11:17  Ref. Range 09/29/2019 13:59 09/29/2019 17:32 09/29/2019 21:19 09/30/2019 06:07  Glucose-Capillary Latest Ref Range: 70 - 99 mg/dL 255 (H) 320 (H) 275 (H) 163 (H)   Diabetes history: Type 2 DM Outpatient Diabetes medications: Lantus 50 units @ QD, Soliqua 100-33- 60 units QAM Current orders for Inpatient glycemic control: Lantus 50 units @ QD, Soliqua 100-33- 60 units QAM, Novolog 0-9 units TID Decadron 4 mg x 1  Inpatient Diabetes Program Recommendations:    Noted current orders.   Instead Consider: -Decreasing Lantus to 25 units QD and discontinue Soliqua.   Patient is followed by Dr Denton Lank, outpatient endocrinology with last appointment 07/04/2019 and patient has met with dietitians in August for dietary changes.   Thanks, Bronson Curb, MSN, RNC-OB Diabetes Coordinator [phone] (8a-5p)

## 2019-09-30 NOTE — Progress Notes (Addendum)
  Progress Note    09/30/2019 7:39 AM 1 Day Post-Op  Subjective:  R foot feels better compared to before surgery however is painful to touch   Vitals:   09/30/19 0023 09/30/19 0458  BP: 129/82 (!) 152/62  Pulse: 95 94  Resp: 14 15  Temp: 98.7 F (37.1 C) 98.6 F (37 C)  SpO2: 94% 93%   Physical Exam: Lungs:  Non labored Incisions:  R groin c/d/i Extremities:  R PT signal by doppler; R distal foot hyperemic; calf painful to touch but soft Neurologic: A&O   CBC    Component Value Date/Time   WBC 11.3 (H) 09/30/2019 0309   RBC 3.22 (L) 09/30/2019 0309   HGB 8.8 (L) 09/30/2019 0309   HGB 11.2 01/28/2018 1035   HGB 12.0 09/29/2011 1016   HCT 29.1 (L) 09/30/2019 0309   HCT 34.4 01/28/2018 1035   HCT 35.3 09/29/2011 1016   PLT 454 (H) 09/30/2019 0309   PLT 384 (H) 01/28/2018 1035   MCV 90.4 09/30/2019 0309   MCV 88 01/28/2018 1035   MCV 88.0 09/29/2011 1016   MCH 27.3 09/30/2019 0309   MCHC 30.2 09/30/2019 0309   RDW 17.7 (H) 09/30/2019 0309   RDW 17.6 (H) 01/28/2018 1035   RDW 14.8 (H) 09/29/2011 1016   LYMPHSABS 1.1 09/11/2017 2011   LYMPHSABS 0.6 (L) 09/29/2011 1016   MONOABS 1.5 (H) 09/11/2017 2011   MONOABS 0.8 09/29/2011 1016   EOSABS 0.2 09/11/2017 2011   EOSABS 0.3 09/29/2011 1016   BASOSABS 0.0 09/11/2017 2011   BASOSABS 0.1 09/29/2011 1016    BMET    Component Value Date/Time   NA 134 (L) 09/30/2019 0309   NA 142 01/28/2018 1035   K 3.5 09/30/2019 0309   CL 96 (L) 09/30/2019 0309   CO2 26 09/30/2019 0309   GLUCOSE 209 (H) 09/30/2019 0309   BUN 21 09/30/2019 0309   BUN 19 01/28/2018 1035   BUN 14.7 07/18/2014 1142   CREATININE 1.15 (H) 09/30/2019 0309   CREATININE 1.0 07/18/2014 1142   CALCIUM 8.5 (L) 09/30/2019 0309   GFRNONAA 49 (L) 09/30/2019 0309   GFRAA 56 (L) 09/30/2019 0309    INR    Component Value Date/Time   INR 1.2 09/29/2019 0628     Intake/Output Summary (Last 24 hours) at 09/30/2019 0739 Last data filed at 09/30/2019  0501 Gross per 24 hour  Intake 1250 ml  Output 2875 ml  Net -1625 ml     Assessment/Plan:  68 y.o. female is s/p R iliofemoral endart with vein patch angioplasty 1 Day Post-Op   R PT signal by doppler; R foot hyperemic R calf painful but somewhat soft; continue to monitor Dry dressing changes at least BID R groin, no tape Home when pain controlled; maybe over the weekend   Dagoberto Ligas, PA-C [facility] [phone] 09/30/2019 7:39 AM  I have independently interviewed and examinedpatient and agree with PA assessment and plan above.  We will allow right foot to demarcate hopefully all is salvageable.  Will need to have right groin wound dressing changed daily to keep it dry.   C. Donzetta Matters, MD [facility] [place] Office: [phone] Pager: [phone]

## 2019-10-01 LAB — GLUCOSE, CAPILLARY
Glucose-Capillary: 167 mg/dL — ABNORMAL HIGH (ref 70–99)
Glucose-Capillary: 202 mg/dL — ABNORMAL HIGH (ref 70–99)
Glucose-Capillary: 228 mg/dL — ABNORMAL HIGH (ref 70–99)
Glucose-Capillary: 220 mg/dL — ABNORMAL HIGH (ref 70–99)

## 2019-10-01 NOTE — Plan of Care (Signed)

## 2019-10-01 NOTE — TOC Initial Note (Signed)
Transition of Care [facility]) - Initial/Assessment Note    Patient Details  Name: Lauren [name] MRN: [id number] Date of Birth: August 30, 1951  [facility]) CM/SW Contact:    Carles Collet, RN Phone Number: 10/01/2019, 11:26 AM  Clinical Narrative:                Damaris Schooner w patient at bedside. She was verbally abusive, swearing at [facility] and name calling after I introduced myself and role. Called husband [name] who was agreeable to discuss home resources. He stated that he would be in from 1-4 today to observe PT session. He stated he was leaning toward short term SNF at DC. He provided that [facility] was following patient PTA and she has a new lift chair, WC, raised toilet seat with arms, walker, and a brace for both her L and R foot at home already.  TOC will continue to follow, spouse stated he thought she would be staying until Monday.     Expected Discharge Plan: Skilled Nursing Facility Barriers to Discharge: Continued Medical Work up   Patient Goals and CMS Choice Patient states their goals for this hospitalization and ongoing recovery are:: patient did not state, spouse stated he anticipated patient would go to rehab      Expected Discharge Plan and Services Expected Discharge Plan: [place]   Discharge Planning Services: CM Consult                                          Prior Living Arrangements/Services   Lives with:: Spouse              Current home services: Homehealth aide, Home RN([facility])    Activities of Daily Living      Permission Sought/Granted                  Emotional Assessment              Admission diagnosis:  PAD (peripheral artery disease) ([place]) [I73.9] Patient Active Problem List   Diagnosis Date Noted  . Femoral artery occlusion, right ([place]) 09/29/2019  . Atherosclerosis of right lower extremity with rest pain ([place]) 09/08/2019  . PAH (pulmonary artery hypertension) ([place]) 07/10/2019  . 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 DVT, lower extremity, proximal, acute, left ([place]) 07/10/2019  . Hypercholesterolemia 07/10/2019  . Achilles tendon contracture, right   . SBO (small bowel obstruction) ([place]) 07/30/2018  . Acute lower UTI 07/30/2018  . Hypernatremia 07/30/2018  . Peripheral vascular disease due to secondary diabetes mellitus ([place]) 05/17/2018  . Foot drop, bilateral 05/17/2018  . Foot drop, left 02/11/2018  . DOE (dyspnea on exertion) 01/28/2018  . DM (diabetes mellitus) type II uncontrolled, periph vascular disorder ([place]) 01/28/2018  . Diastolic dysfunction [id number]  . Abnormal liver function tests 11/14/2017  . 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 Acute encephalopathy 08/22/2017  . Acute urinary retention 08/22/2017  . AKI (acute kidney injury) ([place]) 08/22/2017  . PAD (peripheral artery disease) ([place]) 08/15/2017  . Cor pulmonale, chronic ([place]) 08/15/2017  . Chronic diastolic heart failure ([place]) 08/15/2017  . History of cigarette smoking 08/15/2017  . Multiple pulmonary nodules determined by computed tomography of lung 07/31/2017  . 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 Pressure ulcer of left heel, stage 2 ([place]) 02/04/2017  . Altered mental status   .  Community acquired pneumonia   . Encephalopathy   . Fall   . Weakness of both lower extremities   . Chronic pain   . Narcotic dependence ([place])   . Incontinence of feces   . Sepsis ([place]) 02/02/2017  . Carotid stenosis, bilateral 01/30/2017  . Aftercare following surgery of the circulatory system 01/30/2017  . Impingement syndrome of left shoulder 12/17/2016  . Coronary artery disease of native artery of native heart with stable angina pectoris ([place]) 10/07/2016  . Iron deficiency anemia 10/07/2016  . DDD (degenerative disc disease), lumbar 10/03/2016  . Fatigue 10/03/2016  . Intervertebral disc disease 10/03/2016  . Low back pain 10/03/2016   . Myelopathy ([place]) 10/03/2016  . Prolapsed lumbosacral intervertebral disc 10/03/2016  . Spondylisthesis 10/03/2016  . MRSA (methicillin resistant staph aureus) culture positive 10/10/2015  . Carotid stenosis 08/17/2015  . Preoperative cardiovascular examination 08/10/2015  . Tobacco abuse 08/10/2015  . 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 Acute osteomyelitis of spine ([place]) 08/28/2014  . Difficult or painful urination 08/28/2014  . H/O Spinal surgery 08/28/2014  . Elevated WBC count 08/28/2014  . Encounter for aftercare for long-term (current) use of antibiotics 08/28/2014  . N&V (nausea and vomiting) 08/28/2014  . Bladder retention 08/28/2014  . Infection of urinary tract 08/28/2014  . Ventral hernia 06/15/2013  . COPD GOLD III/IV 06/15/2013  . DJD (degenerative joint disease) of lumbar spine 06/15/2013  . Depression 06/15/2013  . Exertional angina ([place]) 06/15/2013  . Preoperative evaluation 06/15/2013  . Occlusion and stenosis of carotid artery without mention of cerebral infarction 03/02/2012  . Malignant neoplasm of corpus uteri, except isthmus ([place]) 01/19/2012  . Abnormal EKG 12/07/2011  . Essential hypertension, benign 12/07/2011  . Mixed hyperlipidemia 12/07/2011  . Carotid artery disease without cerebral infarction ([place]) 12/07/2011   PCP:  [name], MD Pharmacy:   [facility] [place] [address] Phone: [phone] Fax: [phone]     Social Determinants of Health (SDOH) Interventions    Readmission Risk Interventions Readmission Risk Prevention Plan 08/07/2018  Transportation Screening Complete  Medication Review ([place]) Complete  PCP or Specialist appointment within 3-5 days of discharge Patient refused  [place] or Home Care Consult Complete  SW Recovery Care/Counseling Consult Complete  Palliative Care Screening Complete  Medication Reconcilation  (Pharmacy) Complete  Skilled Nursing Facility Complete  Some recent data might be hidden

## 2019-10-01 NOTE — Progress Notes (Addendum)
  Progress Note    10/01/2019 8:47 AM 2 Days Post-Op  Subjective:  R foot still painful to touch   Vitals:   10/01/19 0432 10/01/19 0828  BP: 135/66   Pulse: 84   Resp: 17   Temp: 98.3 F (36.8 C)   SpO2: 95% 95%   Physical Exam: Lungs:  Non labored Incisions:  R groin incision c/d/i Extremities:  R PTA signal by doppler; foot painful to touch, warm to touch, dusky appearing toes Abdomen:  Soft Neurologic: A&O  CBC    Component Value Date/Time   WBC 11.3 (H) 09/30/2019 0309   RBC 3.22 (L) 09/30/2019 0309   HGB 8.8 (L) 09/30/2019 0309   HGB 11.2 01/28/2018 1035   HGB 12.0 09/29/2011 1016   HCT 29.1 (L) 09/30/2019 0309   HCT 34.4 01/28/2018 1035   HCT 35.3 09/29/2011 1016   PLT 454 (H) 09/30/2019 0309   PLT 384 (H) 01/28/2018 1035   MCV 90.4 09/30/2019 0309   MCV 88 01/28/2018 1035   MCV 88.0 09/29/2011 1016   MCH 27.3 09/30/2019 0309   MCHC 30.2 09/30/2019 0309   RDW 17.7 (H) 09/30/2019 0309   RDW 17.6 (H) 01/28/2018 1035   RDW 14.8 (H) 09/29/2011 1016   LYMPHSABS 1.1 09/11/2017 2011   LYMPHSABS 0.6 (L) 09/29/2011 1016   MONOABS 1.5 (H) 09/11/2017 2011   MONOABS 0.8 09/29/2011 1016   EOSABS 0.2 09/11/2017 2011   EOSABS 0.3 09/29/2011 1016   BASOSABS 0.0 09/11/2017 2011   BASOSABS 0.1 09/29/2011 1016    BMET    Component Value Date/Time   NA 134 (L) 09/30/2019 0309   NA 142 01/28/2018 1035   K 3.5 09/30/2019 0309   CL 96 (L) 09/30/2019 0309   CO2 26 09/30/2019 0309   GLUCOSE 209 (H) 09/30/2019 0309   BUN 21 09/30/2019 0309   BUN 19 01/28/2018 1035   BUN 14.7 07/18/2014 1142   CREATININE 1.15 (H) 09/30/2019 0309   CREATININE 1.0 07/18/2014 1142   CALCIUM 8.5 (L) 09/30/2019 0309   GFRNONAA 49 (L) 09/30/2019 0309   GFRAA 56 (L) 09/30/2019 0309    INR    Component Value Date/Time   INR 1.2 09/29/2019 0628     Intake/Output Summary (Last 24 hours) at 10/01/2019 0847 Last data filed at 10/01/2019 [id number] Gross per 24 hour  Intake -  Output  3050 ml  Net -3050 ml     Assessment/Plan:  67 y.o. female is s/p R iliofemoral endart with vein patch angioplasty 2 Days Post-Op   Perfusing R foot well with PTA signal by doppler I suspect reperfusion pain R foot; calf soft; continue current pain medicine regimen PT/OT recommending SNF; patient refuses SNF, prefers [facility] PT; case management consulted Home when HHPT arranged and post operative pain better controlled   Dagoberto Ligas, PA-C [facility] [phone] 10/01/2019 8:47 AM   I have seen and evaluated the patient. I agree with the PA note as documented above. POD#2 s/p right CFA endarterectomy.  Brisk PT by doppler to right foot.  Will let foot demarcate.  Patient does not feel ready for discharge at this time.  She does not want to go to SNF.    Marty Heck, MD [facility] [place] Office: [phone] Pager: [phone]

## 2019-10-01 NOTE — Progress Notes (Signed)
Rehab Admissions Coordinator Note:  Per PT recommendation, this patient was screened by Raechel Ache for appropriateness for an Inpatient Acute Rehab Consult. Per chart review it appears this patient has been having mobility issues for a while and progress is currently limited by pain and poor endurnace. Unsure if pt will be able to tolerate the intensity of IP Rehab but will go ahead and request consult order from MD to allow for for further assessment and evaluation of IP Rehab needs.   Raechel Ache 10/01/2019, 4:49 PM  I can be reached at [phone].

## 2019-10-01 NOTE — Progress Notes (Signed)
Physical Therapy Treatment Patient Details Name: Lauren [name] MRN: [id number] DOB: [birth year] Today's Date: 10/01/2019    History of Present Illness Lauren [name] is a 68 y.o. (1951/02/13) female with past medical history significant for hypertension, hyperlipidemia, insulin-dependent diabetes mellitus, severe COPD, cor pulmonale, and CAD who presents with chief complaint worsening pain R foot and great toe.  pt to OR for SFA endarterectomies    PT Comments    Pt admitted with above diagnosis. Pt was able to sit EOB x 15 min with min guard assist and perform some exercises and scooting in bed.  Pt participates with encouragement.  Husband present and both agree with Rehab.  Will try for inpt Rehab as this PT feels pt could benefit.  Will follow acutely.  Pt currently with functional limitations due to balance and endurance deficits. Pt will benefit from skilled PT to increase their independence and safety with mobility to allow discharge to the venue listed below.     Follow Up Recommendations  CIR;Supervision/Assistance - 24 hour     Equipment Recommendations  None recommended by PT    Recommendations for Other Services Rehab consult     Precautions / Restrictions Precautions Precautions: Fall Precaution Comments: right leg pain and tenderness to touch Restrictions Weight Bearing Restrictions: No    Mobility  Bed Mobility Overal bed mobility: Needs Assistance Bed Mobility: Supine to Sit     Supine to sit: Max assist;Mod assist;+2 for physical assistance     General bed mobility comments: pt initiating scooting of BLEs to EOB; mod to max A of 2 at trunk and to scoot hips EOB with use of pad  Transfers Overall transfer level: Needs assistance Equipment used: None Transfers: Lateral/Scoot Transfers          Lateral/Scoot Transfers: Max assist;+2 physical assistance General transfer comment: Would not stand due to pain in right LE.  Asked MD for cast shoe for  right foot. Scooted pt left to [facility] with max assist of 2 and use of pad  Ambulation/Gait             General Gait Details: unable - hasnt walked for a long time   Stairs             Wheelchair Mobility    Modified Rankin (Stroke Patients Only)       Balance Overall balance assessment: Needs assistance Sitting-balance support: Bilateral upper extremity supported;Feet supported(left foot more supportive and right LE just resting on floor) Sitting balance-Leahy Scale: Fair Sitting balance - Comments: pt sat EOB with left foot on the floor with sacral sitting posture but was able to sit unsupported with min gaurd assist with bil UE support and perform some exercises.                                     Cognition Arousal/Alertness: Awake/alert Behavior During Therapy: Restless;Anxious Overall Cognitive Status: Within Functional Limits for tasks assessed                                 General Comments: Willing to get up with encouragment and explanation.      Exercises Total Joint Exercises Ankle Circles/Pumps: AROM;10 reps;Supine;Both Long Arc Quad: AROM;Seated;Both;15 reps General Exercises - Lower Extremity Hip Flexion/Marching: AROM;Both;10 reps;Seated    General Comments General comments (skin integrity, edema, etc.): Pt educated  on floating heels and importance of not sliding skin.  pt encouraged to do AROM as able with right leg to help increase circulation      Pertinent Vitals/Pain Pain Assessment: Faces Faces Pain Scale: Hurts worst Pain Location: right leg Pain Descriptors / Indicators: Aching;Burning;Discomfort;Pressure;Throbbing;Tightness Pain Intervention(s): Limited activity within patient's tolerance;Premedicated before session;Monitored during session;Repositioned    Home Living                      Prior Function            PT Goals (current goals can now be found in the care plan section) Acute  Rehab PT Goals Patient Stated Goal: I want to go home with my husband Progress towards PT goals: Progressing toward goals    Frequency    Min 3X/week      PT Plan Discharge plan needs to be updated    Co-evaluation PT/OT/SLP Co-Evaluation/Treatment: Yes            AM-PAC PT "6 Clicks" Mobility   Outcome Measure  Help needed turning from your back to your side while in a flat bed without using bedrails?: A Lot Help needed moving from lying on your back to sitting on the side of a flat bed without using bedrails?: A Lot Help needed moving to and from a bed to a chair (including a wheelchair)?: Total Help needed standing up from a chair using your arms (e.g., wheelchair or bedside chair)?: Total Help needed to walk in hospital room?: Total Help needed climbing 3-5 steps with a railing? : Total 6 Click Score: 8    End of Session Equipment Utilized During Treatment: Gait belt Activity Tolerance: Patient limited by pain;Patient limited by fatigue Patient left: with call bell/phone within reach;in bed;with family/visitor present Nurse Communication: Need for lift equipment;Mobility status PT Visit Diagnosis: Muscle weakness (generalized) (M62.81);Unsteadiness on feet (R26.81);Pain Pain - Right/Left: Right Pain - part of body: Leg     Time: 1359-1438 PT Time Calculation (min) (ACUTE ONLY): 39 min  Charges:  $Therapeutic Exercise: 8-22 mins $Therapeutic Activity: 23-37 mins                     [place] Pager:  [phone]  Office:  [place] 10/01/2019, 3:35 PM

## 2019-10-02 LAB — GLUCOSE, CAPILLARY
Glucose-Capillary: 201 mg/dL — ABNORMAL HIGH (ref 70–99)
Glucose-Capillary: 278 mg/dL — ABNORMAL HIGH (ref 70–99)
Glucose-Capillary: 287 mg/dL — ABNORMAL HIGH (ref 70–99)
Glucose-Capillary: 316 mg/dL — ABNORMAL HIGH (ref 70–99)

## 2019-10-02 NOTE — Progress Notes (Addendum)
  Progress Note    10/02/2019 8:19 AM 3 Days Post-Op  Subjective:  Discussed disposition with husband.  She is more open to SNF if rehab is not an option   Vitals:   10/01/19 2355 10/02/19 0350  BP: (!) 117/50 (!) 111/48  Pulse: 78 76  Resp: 14 13  Temp: 98.7 F (37.1 C) 98.3 F (36.8 C)  SpO2: 96% 95%   Physical Exam: Lungs:  Non labored Incisions:  R groin incision c/d/i Extremities:  R PT by doppler; dusky GT and metatarsal Neurologic: A&O  CBC    Component Value Date/Time   WBC 11.3 (H) 09/30/2019 0309   RBC 3.22 (L) 09/30/2019 0309   HGB 8.8 (L) 09/30/2019 0309   HGB 11.2 01/28/2018 1035   HGB 12.0 09/29/2011 1016   HCT 29.1 (L) 09/30/2019 0309   HCT 34.4 01/28/2018 1035   HCT 35.3 09/29/2011 1016   PLT 454 (H) 09/30/2019 0309   PLT 384 (H) 01/28/2018 1035   MCV 90.4 09/30/2019 0309   MCV 88 01/28/2018 1035   MCV 88.0 09/29/2011 1016   MCH 27.3 09/30/2019 0309   MCHC 30.2 09/30/2019 0309   RDW 17.7 (H) 09/30/2019 0309   RDW 17.6 (H) 01/28/2018 1035   RDW 14.8 (H) 09/29/2011 1016   LYMPHSABS 1.1 09/11/2017 2011   LYMPHSABS 0.6 (L) 09/29/2011 1016   MONOABS 1.5 (H) 09/11/2017 2011   MONOABS 0.8 09/29/2011 1016   EOSABS 0.2 09/11/2017 2011   EOSABS 0.3 09/29/2011 1016   BASOSABS 0.0 09/11/2017 2011   BASOSABS 0.1 09/29/2011 1016    BMET    Component Value Date/Time   NA 134 (L) 09/30/2019 0309   NA 142 01/28/2018 1035   K 3.5 09/30/2019 0309   CL 96 (L) 09/30/2019 0309   CO2 26 09/30/2019 0309   GLUCOSE 209 (H) 09/30/2019 0309   BUN 21 09/30/2019 0309   BUN 19 01/28/2018 1035   BUN 14.7 07/18/2014 1142   CREATININE 1.15 (H) 09/30/2019 0309   CREATININE 1.0 07/18/2014 1142   CALCIUM 8.5 (L) 09/30/2019 0309   GFRNONAA 49 (L) 09/30/2019 0309   GFRAA 56 (L) 09/30/2019 0309    INR    Component Value Date/Time   INR 1.2 09/29/2019 0628     Intake/Output Summary (Last 24 hours) at 10/02/2019 0819 Last data filed at 10/02/2019 0357 Gross  per 24 hour  Intake 960 ml  Output 1660 ml  Net -700 ml     Assessment/Plan:  68 y.o. female is s/p R iliofemoral endart with vein patch angioplasty 3 Days Post-Op   R PTA signal by doppler R GT appears dusky; will continue to let this demarcate PT/OT recommending CIR; CIR consulted Dispo: patient likely not a candidate for CIR however is now more willing to go to SNF after discussion with her husband   Dagoberto Ligas, PA-C [facility] [phone] 10/02/2019 8:19 AM   I have seen and evaluated the patient. I agree with the PA note as documented above. Post-op status post right CFA endarterectomy with vein patch.  Right groin still painful, but looks ok.  Brisk right PT signal.  Will let foot demarcate.  PT recommended CIR - will consult.  Willing to go to SNF if does not qualify for CIR.  Marty Heck, MD [facility] [place] Office: [phone] Pager: [phone]

## 2019-10-02 NOTE — Plan of Care (Signed)

## 2019-10-03 LAB — GLUCOSE, RANDOM: Glucose, Bld: 452 mg/dL — ABNORMAL HIGH (ref 70–99)

## 2019-10-03 LAB — GLUCOSE, CAPILLARY
Glucose-Capillary: 316 mg/dL — ABNORMAL HIGH (ref 70–99)
Glucose-Capillary: 481 mg/dL — ABNORMAL HIGH (ref 70–99)
Glucose-Capillary: 318 mg/dL — ABNORMAL HIGH (ref 70–99)
Glucose-Capillary: 380 mg/dL — ABNORMAL HIGH (ref 70–99)

## 2019-10-03 MED ORDER — INSULIN ASPART 100 UNIT/ML ~~LOC~~ SOLN
4.0000 [IU] | Freq: Three times a day (TID) | SUBCUTANEOUS | Status: DC | PRN
  Administered 2019-10-04 – 2019-11-13 (×9): 4 [IU] via SUBCUTANEOUS
  Filled 2019-10-03 (×10): qty 0.04

## 2019-10-03 MED ORDER — INSULIN ASPART 100 UNIT/ML ~~LOC~~ SOLN: 10.0000 [IU] | Freq: Once | SUBCUTANEOUS | Status: DC

## 2019-10-03 MED ORDER — INSULIN GLARGINE 100 UNIT/ML ~~LOC~~ SOLN
50.0000 [IU] | Freq: Every day | SUBCUTANEOUS | Status: DC
  Administered 2019-10-04 – 2019-11-17 (×43): 50 [IU] via SUBCUTANEOUS
  Filled 2019-10-03 (×46): qty 0.5

## 2019-10-03 NOTE — Progress Notes (Signed)
Inpatient Rehab Admissions:  Inpatient Rehab Consult received.  I met with pt and her husband at the bedside for rehabilitation assessment. AC discussed program details and differences between CIR and SNF settings. Pt does not feel she is able to tolerate up to 3 hours of therapy a day and her husband agrees she will need a longer program based on current functional level. All in agreement for SNF at this time due to limited tolerance.   AC has communicated recommendation to CM.   AC will sign off.    , OTR/L  Rehab Admissions Coordinator  [phone] 10/03/2019 1:42 PM  

## 2019-10-03 NOTE — Progress Notes (Signed)
Physical Therapy Treatment Patient Details Name: Lauren [name] MRN: [id number] DOB: [birth year] Today's Date: 10/03/2019    History of Present Illness Lauren [name] is a 68 y.o. (1951/11/28) female with past medical history significant for hypertension, hyperlipidemia, insulin-dependent diabetes mellitus, severe COPD, cor pulmonale, and CAD who presents with chief complaint worsening pain R foot and great toe.  pt to OR for SFA endarterectomies    PT Comments    Patient seen for mobility progression. Pt is making gradual progress toward PT goals and requires +2 assist for all mobility this session. Continue to progress as tolerated and recommend post acute rehab for further skilled PT services to maximize independence and safety with mobility.     Follow Up Recommendations  CIR;Supervision/Assistance - 24 hour     Equipment Recommendations  None recommended by PT    Recommendations for Other Services Rehab consult     Precautions / Restrictions Precautions Precautions: Fall Precaution Comments: right leg pain and tenderness to touch Restrictions Weight Bearing Restrictions: No Other Position/Activity Restrictions: L orthotic and shoe for OOB    Mobility  Bed Mobility Overal bed mobility: Needs Assistance Bed Mobility: Supine to Sit;Rolling Rolling: Max assist;+2 for physical assistance   Supine to sit: Max assist;+2 for physical assistance;HOB elevated     General bed mobility comments: cues for sequencing and technique; use of rails and bed pad  Transfers Overall transfer level: Needs assistance   Transfers: Lateral/Scoot Transfers          Lateral/Scoot Transfers: Max assist;+2 physical assistance General transfer comment: pt reports she cannot tolerated R LE weight bearing; +2 with bed pad for lateral scoot to drop arm recliner; cues for sequencing   Ambulation/Gait             General Gait Details: non ambulatory   Stairs              Wheelchair Mobility    Modified Rankin (Stroke Patients Only)       Balance Overall balance assessment: Needs assistance Sitting-balance support: Bilateral upper extremity supported;Feet supported(left foot more supportive and right LE just resting on floor) Sitting balance-Leahy Scale: Fair                                      Cognition Arousal/Alertness: Awake/alert Behavior During Therapy: WFL for tasks assessed/performed Overall Cognitive Status: Within Functional Limits for tasks assessed                                 General Comments: likes to stall mobility      Exercises      General Comments        Pertinent Vitals/Pain Pain Assessment: Faces Faces Pain Scale: Hurts even more Pain Location: right leg Pain Descriptors / Indicators: Grimacing;Guarding;Moaning;Sore Pain Intervention(s): Limited activity within patient's tolerance;Monitored during session;Repositioned    Home Living                      Prior Function            PT Goals (current goals can now be found in the care plan section) Progress towards PT goals: Progressing toward goals    Frequency    Min 3X/week      PT Plan Discharge plan needs to be updated    Co-evaluation  AM-PAC PT "6 Clicks" Mobility   Outcome Measure  Help needed turning from your back to your side while in a flat bed without using bedrails?: A Lot Help needed moving from lying on your back to sitting on the side of a flat bed without using bedrails?: A Lot Help needed moving to and from a bed to a chair (including a wheelchair)?: A Lot Help needed standing up from a chair using your arms (e.g., wheelchair or bedside chair)?: Total Help needed to walk in hospital room?: Total Help needed climbing 3-5 steps with a railing? : Total 6 Click Score: 9    End of Session Equipment Utilized During Treatment: Gait belt Activity Tolerance: Patient  tolerated treatment well Patient left: with call bell/phone within reach;in chair Nurse Communication: Need for lift equipment;Mobility status PT Visit Diagnosis: Muscle weakness (generalized) (M62.81);Unsteadiness on feet (R26.81);Pain Pain - Right/Left: Right Pain - part of body: Leg     Time: 1207-1243 PT Time Calculation (min) (ACUTE ONLY): 36 min  Charges:  $Therapeutic Activity: 23-37 mins                     Earney Navy, PTA Acute Rehabilitation Services Pager: [phone] Office: [phone]     Darliss Cheney 10/03/2019, 2:45 PM

## 2019-10-03 NOTE — Care Management Important Message (Signed)
Important Message  Patient Details  Name: Lauren [name] MRN: [id number] Date of Birth: 05-Feb-1951   Medicare Important Message Given:  Yes     [name] 10/03/2019, 2:07 PM

## 2019-10-03 NOTE — Progress Notes (Addendum)
[facility] [place] lady without new complaints.  Still having pain in the right groin area and right forefoot.   Objective 138/76 88 98.5 F (36.9 C) (Oral) 16 98%  Intake/Output Summary (Last 24 hours) at 10/03/2019 0729 Last data filed at 10/03/2019 0600 Gross per 24 hour  Intake 600 ml  Output 3050 ml  Net -2450 ml    Doppler PT right LE absent DP, erythema in forefoot/toes, warm to touch. Right groin incision intact, dry guaze placed to keep incision dry Lungs non labored breathing    Assessment/Planning: POD # 4 68 y.o. female is s/p R iliofemoral endart with vein patch angioplasty  Erythema in forefoot and toes will continue to observe demarcation. Pending CIR verse SNF for rehabilitation Low grade temp will draw cbc for am Pending disposition  Roxy Horseman 10/03/2019 7:29 AM --  Laboratory Lab Results: No results for input(s): WBC, HGB, HCT, PLT in the last 72 hours. BMET No results for input(s): NA, K, CL, CO2, GLUCOSE, BUN, CREATININE, CALCIUM in the last 72 hours.  COAG Lab Results  Component Value Date   INR 1.2 09/29/2019   INR 1.1 09/15/2019   INR 1.14 08/22/2017   No results found for: PTT  I have independently interviewed and examined patient and agree with PA assessment and plan above. Strong right pt signal can be traced nearly to toes distally. Groin incision changed bid. Allowing foot to demarcate. She will need snf.   Lillyth Spong C. Donzetta Matters, MD [facility] [place] Office: [phone] Pager: [phone]

## 2019-10-03 NOTE — Progress Notes (Addendum)
Inpatient Diabetes Program Recommendations  AACE/ADA: New Consensus Statement on Inpatient Glycemic Control (2015)  Target Ranges:  Prepandial:   less than 140 mg/dL      Peak postprandial:   less than 180 mg/dL (1-2 hours)      Critically ill patients:  140 - 180 mg/dL   Lab Results  Component Value Date   GLUCAP 316 (H) 10/03/2019   HGBA1C 9.4 (H) 09/13/2019    Review of Glycemic Control Results for Lauren [name], Lauren [name] (MRN [id number]) as of 10/03/2019 10:46  Ref. Range 10/02/2019 11:47 10/02/2019 16:28 10/02/2019 20:50 10/03/2019 06:38  Glucose-Capillary Latest Ref Range: 70 - 99 mg/dL 278 (H) 287 (H) 316 (H) 316 (H)   Diabetes history:Type 2 DM Outpatient Diabetes medications:Lantus 50 units @ QD, Soliqua 100-33- 60 units QAM Current orders for Inpatient glycemic control:Lantus 25 units @ QD, Novolog 0-9 units TID  Inpatient Diabetes Program Recommendations:  Noted current orders. Lunchtime CBG 481 mg/dL.  Consider:  -Add Lantus 20 units QHS x 1 dose. -Increasing Lantus to 50 units QD (to start on 10/6).  - If post prandials continue to exceed 180 mg/dL, consider adding Novolog 4 units TID (assuming patient is consuming >50% of meals).   Thanks, Bronson Curb, MSN, RNC-OB Diabetes Coordinator [phone] (8a-5p)

## 2019-10-04 LAB — CBC
HCT: 28.8 % — ABNORMAL LOW (ref 36.0–46.0)
Hemoglobin: 8.8 g/dL — ABNORMAL LOW (ref 12.0–15.0)
MCH: 27.5 pg (ref 26.0–34.0)
MCHC: 30.6 g/dL (ref 30.0–36.0)
MCV: 90 fL (ref 80.0–100.0)
Platelets: 527 10*3/uL — ABNORMAL HIGH (ref 150–400)
RBC: 3.2 MIL/uL — ABNORMAL LOW (ref 3.87–5.11)
RDW: 17.4 % — ABNORMAL HIGH (ref 11.5–15.5)
WBC: 13.4 10*3/uL — ABNORMAL HIGH (ref 4.0–10.5)
nRBC: 0.1 % (ref 0.0–0.2)

## 2019-10-04 LAB — GLUCOSE, CAPILLARY
Glucose-Capillary: 511 mg/dL (ref 70–99)
Glucose-Capillary: 245 mg/dL — ABNORMAL HIGH (ref 70–99)
Glucose-Capillary: 314 mg/dL — ABNORMAL HIGH (ref 70–99)
Glucose-Capillary: 365 mg/dL — ABNORMAL HIGH (ref 70–99)
Glucose-Capillary: 208 mg/dL — ABNORMAL HIGH (ref 70–99)

## 2019-10-04 NOTE — Progress Notes (Addendum)
  Progress Note    10/04/2019 7:21 AM 5 Days Post-Op  Subjective:  C/o right hip pain  Tm 99.9 now afebrile HR 80's-90's NSR [id number] systolic 0000000 RA   Vitals:   10/04/19 0032 10/04/19 0403  BP: (!) 127/53 (!) 146/55  Pulse: 97 97  Resp: 17 18  Temp: 99.1 F (37.3 C) 98.5 F (36.9 C)  SpO2: 95% 97%    Physical Exam: Cardiac:  regular Lungs:  Non labored Incisions:  Dry gauze in right groin  Extremities:  Brisk right PT doppler signal   CBC    Component Value Date/Time   WBC 13.4 (H) 10/04/2019 0205   RBC 3.20 (L) 10/04/2019 0205   HGB 8.8 (L) 10/04/2019 0205   HGB 11.2 01/28/2018 1035   HGB 12.0 09/29/2011 1016   HCT 28.8 (L) 10/04/2019 0205   HCT 34.4 01/28/2018 1035   HCT 35.3 09/29/2011 1016   PLT 527 (H) 10/04/2019 0205   PLT 384 (H) 01/28/2018 1035   MCV 90.0 10/04/2019 0205   MCV 88 01/28/2018 1035   MCV 88.0 09/29/2011 1016   MCH 27.5 10/04/2019 0205   MCHC 30.6 10/04/2019 0205   RDW 17.4 (H) 10/04/2019 0205   RDW 17.6 (H) 01/28/2018 1035   RDW 14.8 (H) 09/29/2011 1016   LYMPHSABS 1.1 09/11/2017 2011   LYMPHSABS 0.6 (L) 09/29/2011 1016   MONOABS 1.5 (H) 09/11/2017 2011   MONOABS 0.8 09/29/2011 1016   EOSABS 0.2 09/11/2017 2011   EOSABS 0.3 09/29/2011 1016   BASOSABS 0.0 09/11/2017 2011   BASOSABS 0.1 09/29/2011 1016    BMET    Component Value Date/Time   NA 134 (L) 09/30/2019 0309   NA 142 01/28/2018 1035   K 3.5 09/30/2019 0309   CL 96 (L) 09/30/2019 0309   CO2 26 09/30/2019 0309   GLUCOSE 452 (H) 10/03/2019 1255   BUN 21 09/30/2019 0309   BUN 19 01/28/2018 1035   BUN 14.7 07/18/2014 1142   CREATININE 1.15 (H) 09/30/2019 0309   CREATININE 1.0 07/18/2014 1142   CALCIUM 8.5 (L) 09/30/2019 0309   GFRNONAA 49 (L) 09/30/2019 0309   GFRAA 56 (L) 09/30/2019 0309    INR    Component Value Date/Time   INR 1.2 09/29/2019 0628     Intake/Output Summary (Last 24 hours) at 10/04/2019 0721 Last data filed at 10/03/2019 2021 Gross  per 24 hour  Intake 640 ml  Output 2000 ml  Net -1360 ml     Assessment:  68 y.o. female is s/p:  femaleis s/p R iliofemoral endart with vein patch angioplasty  5 Days Post-Op  Plan: -pt with brisk doppler signal right PT -pt will need SNF as she does not feel she can tolerate 3 hours of therapy a day.   -c/o right hip pain-unsure if this is new-will d/w Dr. Donzetta Matters.  May need xray of hip.  -DM-glucose still elevated but improved from yesterday; appreciate DM coordinator assistance  -DVT prophylaxis:  Eliquis   Leontine Locket, PA-C [facility] [phone] 10/04/2019 7:21 AM   I have independently interviewed and examined patient and agree with PA assessment and plan above.  I had difficulty visualizing her right groin today it does appear to be a foul smell and will need to evaluate further tomorrow.  Possibly will need groin debridement prior to discharge.  Cataldo Cosgriff C. Donzetta Matters, MD [facility] [place] Office: [phone] Pager: [phone]

## 2019-10-05 ENCOUNTER — Encounter: Payer: Self-pay | Admitting: Gynecology

## 2019-10-05 LAB — GLUCOSE, CAPILLARY
Glucose-Capillary: 197 mg/dL — ABNORMAL HIGH (ref 70–99)
Glucose-Capillary: 303 mg/dL — ABNORMAL HIGH (ref 70–99)
Glucose-Capillary: 263 mg/dL — ABNORMAL HIGH (ref 70–99)
Glucose-Capillary: 304 mg/dL — ABNORMAL HIGH (ref 70–99)

## 2019-10-05 NOTE — TOC Initial Note (Signed)
Transition of Care [facility]) - Initial/Assessment Note    Patient Details  Name: Lauren [name] MRN: [id number] Date of Birth: [birth year]  Transition of Care [facility]) CM/SW Contact:    Vinie Sill, [place] Phone Number: 10/05/2019, 3:54 PM  Clinical Narrative:                  CSW visit with the patient at bedside. CSW introduced self and explained role. CSW discussed PT recommendation of ST rehab at [facility]. Patient states she was agreeable to ST rehab at SNF prior to going home. Patient states she has been to [facility] before and was willing to go back there if they have availability. Patient gave CSW permission to send out referrals in the [place] area. Patient states no questions or concerns at this time.  CSW called patient's spouse, [name] and explained the SNF process.    CSW will continue to follow and assist with discharge planning.  Thurmond Butts, MSW, [facility] Clinical Social Worker [phone]   Expected Discharge Plan: Skilled Nursing Facility Barriers to Discharge: Continued Medical Work up   Patient Goals and CMS Choice Patient states their goals for this hospitalization and ongoing recovery are:: patient did not state, spouse stated he anticipated patient would go to rehab      Expected Discharge Plan and Services Expected Discharge Plan: [place]   Discharge Planning Services: CM Consult                                          Prior Living Arrangements/Services   Lives with:: Spouse Patient language and need for interpreter reviewed:: Yes        Need for Family Participation in Patient Care: Yes (Comment)   Current home services: Homehealth aide, Home RN([facility]) Criminal Activity/Legal Involvement Pertinent to Current Situation/Hospitalization: No - Comment as needed  Activities of Daily Living      Permission Sought/Granted Permission sought to share information with : Family Supports    Share Information with NAME:  Trynitee Fore  Permission granted to share info w AGENCY: SNFs  Permission granted to share info w Relationship: spouse  Permission granted to share info w Contact Information: [phone]  Emotional Assessment Appearance:: Appears stated age Attitude/Demeanor/Rapport: Engaged Affect (typically observed): Appropriate, Hopeful Orientation: : Oriented to Self, Oriented to Place, Oriented to  Time, Oriented to Situation Alcohol / Substance Use: Not Applicable Psych Involvement: No (comment)  Admission diagnosis:  PAD (peripheral artery disease) ([place]) [I73.9] Patient Active Problem List   Diagnosis Date Noted  . Femoral artery occlusion, right ([place]) 09/29/2019  . Atherosclerosis of right lower extremity with rest pain ([place]) 09/08/2019  . PAH (pulmonary artery hypertension) ([place]) 07/10/2019  . 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 DVT, lower extremity, proximal, acute, left ([place]) 07/10/2019  . Hypercholesterolemia 07/10/2019  . Achilles tendon contracture, right   . SBO (small bowel obstruction) ([place]) 07/30/2018  . Acute lower UTI 07/30/2018  . Hypernatremia 07/30/2018  . Peripheral vascular disease due to secondary diabetes mellitus ([place]) 05/17/2018  . Foot drop, bilateral 05/17/2018  . Foot drop, left 02/11/2018  . DOE (dyspnea on exertion) 01/28/2018  . DM (diabetes mellitus) type II uncontrolled, periph vascular disorder ([place]) 01/28/2018  . Diastolic dysfunction [id number]  . Abnormal liver function tests 11/14/2017  . CIDP (chronic inflammatory demyelinating polyneuropathy) ([place]) 09/09/2017  . Acute encephalopathy 08/22/2017  .  Acute urinary retention 08/22/2017  . AKI (acute kidney injury) ([place]) 08/22/2017  . PAD (peripheral artery disease) ([place]) 08/15/2017  . Cor pulmonale, chronic ([place]) 08/15/2017  . Chronic diastolic heart failure ([place]) 08/15/2017  . History of cigarette smoking 08/15/2017  . Multiple pulmonary nodules determined by  computed tomography of lung 07/31/2017  . 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 Pressure ulcer of left heel, stage 2 ([place]) 02/04/2017  . Altered mental status   . Community acquired pneumonia   . Encephalopathy   . Fall   . Weakness of both lower extremities   . Chronic pain   . Narcotic dependence ([place])   . Incontinence of feces   . Sepsis ([place]) 02/02/2017  . Carotid stenosis, bilateral 01/30/2017  . Aftercare following surgery of the circulatory system 01/30/2017  . Impingement syndrome of left shoulder 12/17/2016  . Coronary artery disease of native artery of native heart with stable angina pectoris ([place]) 10/07/2016  . Iron deficiency anemia 10/07/2016  . DDD (degenerative disc disease), lumbar 10/03/2016  . Fatigue 10/03/2016  . Intervertebral disc disease 10/03/2016  . Low back pain 10/03/2016  . Myelopathy ([place]) 10/03/2016  . Prolapsed lumbosacral intervertebral disc 10/03/2016  . Spondylisthesis 10/03/2016  . MRSA (methicillin resistant staph aureus) culture positive 10/10/2015  . Carotid stenosis 08/17/2015  . Preoperative cardiovascular examination 08/10/2015  . Tobacco abuse 08/10/2015  . 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 Acute osteomyelitis of spine ([place]) 08/28/2014  . Difficult or painful urination 08/28/2014  . H/O Spinal surgery 08/28/2014  . Elevated WBC count 08/28/2014  . Encounter for aftercare for long-term (current) use of antibiotics 08/28/2014  . N&V (nausea and vomiting) 08/28/2014  . Bladder retention 08/28/2014  . Infection of urinary tract 08/28/2014  . Ventral hernia 06/15/2013  . COPD GOLD III/IV 06/15/2013  . DJD (degenerative joint disease) of lumbar spine 06/15/2013  . Depression 06/15/2013  . Exertional angina ([place]) 06/15/2013  . Preoperative evaluation 06/15/2013  . Occlusion and stenosis of carotid artery without mention of cerebral infarction 03/02/2012  . Malignant neoplasm of corpus uteri, except isthmus ([place]) 01/19/2012  .  Abnormal EKG 12/07/2011  . Essential hypertension, benign 12/07/2011  . Mixed hyperlipidemia 12/07/2011  . Carotid artery disease without cerebral infarction ([place]) 12/07/2011   PCP:  [name], MD Pharmacy:   [facility], [address] [place] [address] Phone: [phone] Fax: [phone]     Social Determinants of Health (SDOH) Interventions    Readmission Risk Interventions Readmission Risk Prevention Plan 08/07/2018  Transportation Screening Complete  Medication Review ([place]) Complete  PCP or Specialist appointment within 3-5 days of discharge Patient refused  [place] or Home Care Consult Complete  SW Recovery Care/Counseling Consult Complete  Palliative Care Screening Complete  Medication Reconcilation (Pharmacy) Complete  Skilled Nursing Facility Complete  Some recent data might be hidden

## 2019-10-05 NOTE — Progress Notes (Signed)
Inpatient Diabetes Program Recommendations  AACE/ADA: New Consensus Statement on Inpatient Glycemic Control (2015)  Target Ranges:  Prepandial:   less than 140 mg/dL      Peak postprandial:   less than 180 mg/dL (1-2 hours)      Critically ill patients:  140 - 180 mg/dL   Lab Results  Component Value Date   GLUCAP 197 (H) 10/05/2019   HGBA1C 9.4 (H) 09/13/2019    Review of Glycemic Control Results for NIURKA, HOUFEK (MRN [id number]) as of 10/05/2019 10:20  Ref. Range 10/04/2019 10:57 10/04/2019 15:54 10/04/2019 21:49 10/05/2019 06:40  Glucose-Capillary Latest Ref Range: 70 - 99 mg/dL 314 (H) 365 (H) 208 (H) 197 (H)  s:    Diabetes history:Type 2 DM Outpatient Diabetes medications:Lantus 50 units @ QD, Soliqua 100-33- 60 units QAM Current orders for Inpatient glycemic control:Lantus 50 units QD, Novolog 0-9 units TID, Novolog 4 units TID Novolog 10 units x 1  Inpatient Diabetes Program Recommendations:   Consider: - Increasing Novolog 8 units TID (assuming patient is consuming >50% of meals).  - Increasing correction to Novolog 0-15 units TID  Thanks, Bronson Curb, MSN, RNC-OB Diabetes Coordinator [phone] (8a-5p)

## 2019-10-05 NOTE — Progress Notes (Addendum)
Physical Therapy Treatment Patient Details Name: Lauren [name] MRN: [id number] DOB: 13-Mar-1951 Today's Date: 10/05/2019    History of Present Illness Lauren [name] is a 68 y.o. ([birth year]) female with past medical history significant for hypertension, hyperlipidemia, insulin-dependent diabetes mellitus, severe COPD, cor pulmonale, and CAD who presents with chief complaint worsening pain R foot and great toe.  pt to OR for SFA endarterectomies    PT Comments    Pt very emotionally labile this session, with periods of tearfulness vs periods of laughing in very short spans. Pt required total assist +2 for bed mobility today, and declined OOB due to severe LE pain. PT/OT also unable to don L shoe due to pt pain and rigid PF of L foot. PT updated recommendation to reflect SNF level of care upon d/c. Will continue to follow acutely.    Follow Up Recommendations  Supervision/Assistance - 24 hour;SNF     Equipment Recommendations  None recommended by PT    Recommendations for Other Services Rehab consult     Precautions / Restrictions Precautions Precautions: Fall Precaution Comments: right leg pain and tenderness to touch Restrictions Weight Bearing Restrictions: No Other Position/Activity Restrictions: L orthotic and shoe for OOB    Mobility  Bed Mobility Overal bed mobility: Needs Assistance Bed Mobility: Supine to Sit;Sit to Supine     Supine to sit: Total assist;+2 for physical assistance;+2 for safety/equipment;HOB elevated Sit to supine: Total assist;+2 for physical assistance;+2 for safety/equipment;HOB elevated   General bed mobility comments: total assist +2 for trunk and LE management, PT making sure to use bed pad to cradle RLE vs physically touching RLE. Pt not assisting with transfer to EOB, actually resisting with use of UEs of bedrails. VERY increased time to perform bed mobility, pt stalls.  Transfers Overall transfer level: (NT)                   Ambulation/Gait                 Stairs             Wheelchair Mobility    Modified Rankin (Stroke Patients Only)       Balance Overall balance assessment: Needs assistance Sitting-balance support: Bilateral upper extremity supported;Feet supported(left foot more supportive and right LE just resting on floor) Sitting balance-Leahy Scale: Fair Sitting balance - Comments: Sat EOB without L shoe donned due to heavy PF position, relatively rigid. Pt with posterior leaning requiring mod assist to correct. Sat EOB ~5 minutes, limited by pain and fatigue. Postural control: Posterior lean                                  Cognition Arousal/Alertness: Awake/alert Behavior During Therapy: WFL for tasks assessed/performed;Anxious Overall Cognitive Status: Impaired/Different from baseline Area of Impairment: Attention;Following commands;Memory;Safety/judgement;Problem solving                   Current Attention Level: Sustained Memory: Decreased short-term memory Following Commands: Follows one step commands with increased time Safety/Judgement: Decreased awareness of deficits;Decreased awareness of safety   Problem Solving: Decreased initiation;Difficulty sequencing;Requires verbal cues;Requires tactile cues General Comments: Pt instructed in short mobility cues, pt often getting distracted and stating "what do you want me to do again?". Pt moving between multiple points of conversation, including mobility, what is on TV, and tearful periods, very difficult to follow pt.      Exercises General  Exercises - Lower Extremity Long Arc Quad: AROM;Both;10 reps;Seated Hip Flexion/Marching: AROM;Both;10 reps;Seated    General Comments        Pertinent Vitals/Pain Pain Location: right leg Pain Descriptors / Indicators: Grimacing;Guarding;Moaning;Sore;Crying Pain Intervention(s): Limited activity within patient's tolerance;Monitored during  session;Repositioned    Home Living                      Prior Function            PT Goals (current goals can now be found in the care plan section) Acute Rehab PT Goals Patient Stated Goal: I want to go home with my husband PT Goal Formulation: With patient Time For Goal Achievement: 10/14/19 Potential to Achieve Goals: Fair Progress towards PT goals: Not progressing toward goals - comment(very limited by pain, defers OOB mobility)    Frequency    Min 2X/week      PT Plan Discharge plan needs to be updated;Frequency needs to be updated    Co-evaluation PT/OT/SLP Co-Evaluation/Treatment: Yes Reason for Co-Treatment: Complexity of the patient's impairments (multi-system involvement);For patient/therapist safety PT goals addressed during session: Mobility/safety with mobility;Balance OT goals addressed during session: ADL's and self-care      AM-PAC PT "6 Clicks" Mobility   Outcome Measure  Help needed turning from your back to your side while in a flat bed without using bedrails?: Total Help needed moving from lying on your back to sitting on the side of a flat bed without using bedrails?: Total Help needed moving to and from a bed to a chair (including a wheelchair)?: Total Help needed standing up from a chair using your arms (e.g., wheelchair or bedside chair)?: Total Help needed to walk in hospital room?: Total Help needed climbing 3-5 steps with a railing? : Total 6 Click Score: 6    End of Session Equipment Utilized During Treatment: Gait belt Activity Tolerance: Patient limited by fatigue;Other (comment)(tearfulness) Patient left: with call bell/phone within reach;in chair Nurse Communication: Need for lift equipment;Mobility status PT Visit Diagnosis: Muscle weakness (generalized) (M62.81);Unsteadiness on feet (R26.81);Pain;Other abnormalities of gait and mobility (R26.89) Pain - Right/Left: Right Pain - part of body: Leg     Time: [id number] PT  Time Calculation (min) (ACUTE ONLY): 24 min  Charges:  $Therapeutic Activity: 8-22 mins                    Lauren [name] Conception Chancy, PT Acute Rehabilitation Services Pager [phone]  Office [phone]   Lauren [name] 10/05/2019, 4:24 PM

## 2019-10-05 NOTE — NC FL2 (Signed)
[place] LEVEL OF CARE SCREENING TOOL     IDENTIFICATION  Patient Name: Lauren [name] Birthdate: 1951/02/27 Sex: female Admission Date (Current Location): 09/29/2019  [facility] and [place] Number:  Herbalist and Address:  [facility] [place], [place] [address] [place], [place] [address]      Provider Number: [id number]  Attending Physician Name and Address:  [place], [place] Name and Phone Number:  Alizandra Canipe    [id number]    Current Level of Care: Hospital Recommended Level of Care: [place] Prior Approval Number:    Date Approved/Denied:   PASRR Number: [id number]  Discharge Plan: SNF    Current Diagnoses: Patient Active Problem List   Diagnosis Date Noted  . Femoral artery occlusion, right ([place]) 09/29/2019  . Atherosclerosis of right lower extremity with rest pain ([place]) 09/08/2019  . PAH (pulmonary artery hypertension) ([place]) 07/10/2019  . 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 DVT, lower extremity, proximal, acute, left ([place]) 07/10/2019  . Hypercholesterolemia 07/10/2019  . Achilles tendon contracture, right   . SBO (small bowel obstruction) ([place]) 07/30/2018  . Acute lower UTI 07/30/2018  . Hypernatremia 07/30/2018  . Peripheral vascular disease due to secondary diabetes mellitus ([place]) 05/17/2018  . Foot drop, bilateral 05/17/2018  . Foot drop, left 02/11/2018  . DOE (dyspnea on exertion) 01/28/2018  . DM (diabetes mellitus) type II uncontrolled, periph vascular disorder ([place]) 01/28/2018  . Diastolic dysfunction [id number]  . Abnormal liver function tests 11/14/2017  . CIDP (chronic inflammatory demyelinating polyneuropathy) ([place]) 09/09/2017  . Acute encephalopathy 08/22/2017  . Acute urinary retention 08/22/2017  . AKI (acute kidney injury) ([place]) 08/22/2017  . PAD (peripheral artery disease) ([place]) 08/15/2017  . Cor pulmonale, chronic ([place])  08/15/2017  . Chronic diastolic heart failure ([place]) 08/15/2017  . History of cigarette smoking 08/15/2017  . Multiple pulmonary nodules determined by computed tomography of lung 07/31/2017  . 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 Pressure ulcer of left heel, stage 2 ([place]) 02/04/2017  . Altered mental status   . Community acquired pneumonia   . Encephalopathy   . Fall   . Weakness of both lower extremities   . Chronic pain   . Narcotic dependence ([place])   . Incontinence of feces   . Sepsis ([place]) 02/02/2017  . Carotid stenosis, bilateral 01/30/2017  . Aftercare following surgery of the circulatory system 01/30/2017  . Impingement syndrome of left shoulder 12/17/2016  . Coronary artery disease of native artery of native heart with stable angina pectoris ([place]) 10/07/2016  . Iron deficiency anemia 10/07/2016  . DDD (degenerative disc disease), lumbar 10/03/2016  . Fatigue 10/03/2016  . Intervertebral disc disease 10/03/2016  . Low back pain 10/03/2016  . Myelopathy ([place]) 10/03/2016  . Prolapsed lumbosacral intervertebral disc 10/03/2016  . Spondylisthesis 10/03/2016  . MRSA (methicillin resistant staph aureus) culture positive 10/10/2015  . Carotid stenosis 08/17/2015  . Preoperative cardiovascular examination 08/10/2015  . Tobacco abuse 08/10/2015  . 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 Acute osteomyelitis of spine ([place]) 08/28/2014  . Difficult or painful urination 08/28/2014  . H/O Spinal surgery 08/28/2014  . Elevated WBC count 08/28/2014  . Encounter for aftercare for long-term (current) use of antibiotics 08/28/2014  . N&V (nausea and vomiting) 08/28/2014  . Bladder retention 08/28/2014  . Infection of urinary tract 08/28/2014  . Ventral hernia 06/15/2013  . COPD GOLD III/IV 06/15/2013  . DJD (degenerative joint disease) of lumbar spine  06/15/2013  . Depression 06/15/2013  . Exertional angina ([place]) 06/15/2013  . Preoperative evaluation 06/15/2013  . Occlusion and  stenosis of carotid artery without mention of cerebral infarction 03/02/2012  . Malignant neoplasm of corpus uteri, except isthmus ([place]) 01/19/2012  . Abnormal EKG 12/07/2011  . Essential hypertension, benign 12/07/2011  . Mixed hyperlipidemia 12/07/2011  . Carotid artery disease without cerebral infarction ([place]) 12/07/2011    Orientation RESPIRATION BLADDER Height & Weight     Self, Time, Situation  Normal Continent Weight: 205 lb 4 oz (93.1 kg) Height:  5' 7.5" (171.5 cm)  BEHAVIORAL SYMPTOMS/MOOD NEUROLOGICAL BOWEL NUTRITION STATUS      Continent Diet(please see discharge summary)  AMBULATORY STATUS COMMUNICATION OF NEEDS Skin     Verbally Surgical wounds(incision(closed)groin,right, incision(closed)leg,right)                       Personal Care Assistance Level of Assistance  Bathing, Feeding, Dressing Bathing Assistance: Limited assistance Feeding assistance: Independent Dressing Assistance: Limited assistance     Functional Limitations Info  Sight, Hearing, Speech Sight Info: Adequate Hearing Info: Adequate Speech Info: Adequate    SPECIAL CARE FACTORS FREQUENCY  PT (By licensed PT), OT (By licensed OT)     PT Frequency: 5x per week OT Frequency: 5x per week            Contractures Contractures Info: Not present    Additional Factors Info  Code Status, Allergies Code Status Info: FULL Allergies Info: Ambien,Sulfa Antibiotics,Ativan,Elavil,Prozac           Current Medications (10/05/2019):  This is the current hospital active medication list Current Facility-Administered Medications  Medication Dose Route Frequency Provider Last Rate Last Dose  . 0.9 %  sodium chloride infusion  500 mL Intravenous Once PRN Laurence Slate M, PA-C      . acetaminophen (TYLENOL) tablet 325-650 mg  325-650 mg Oral Q4H PRN [name], PA-C   650 mg at 09/29/19 2137   Or  . acetaminophen (TYLENOL) suppository 325-650 mg  325-650 mg Rectal Q4H PRN [name],  PA-C      . albuterol (PROVENTIL) (2.5 MG/3ML) 0.083% nebulizer solution 3 mL  3 mL Inhalation Q6H PRN [name], PA-C      . alum & mag hydroxide-simeth (MAALOX/MYLANTA) 200-200-20 MG/5ML suspension 15-30 mL  15-30 mL Oral Q2H PRN Laurence Slate M, PA-C      . amLODipine (NORVASC) tablet 5 mg  5 mg Oral Daily Laurence Slate M, PA-C   5 mg at 10/05/19 0820  . apixaban (ELIQUIS) tablet 5 mg  5 mg Oral BID Laurence Slate M, PA-C   5 mg at 10/05/19 [id number]  . bisacodyl (DULCOLAX) EC tablet 5 mg  5 mg Oral Daily PRN [name], PA-C   5 mg at 10/04/19 0851  . Chlorhexidine Gluconate Cloth 2 % PADS 6 each  6 each Topical Daily [name], MD   6 each at 10/05/19 1058  . docusate sodium (COLACE) capsule 100 mg  100 mg Oral Daily Laurence Slate M, PA-C   100 mg at 10/05/19 [id number]  . escitalopram (LEXAPRO) tablet 20 mg  20 mg Oral Daily Laurence Slate M, PA-C   20 mg at 10/05/19 [id number]  . ferrous sulfate tablet 325 mg  325 mg Oral Daily Laurence Slate M, PA-C   325 mg at 10/05/19 0820  . folic acid (FOLVITE) tablet 1 mg  1,000 mcg Oral Q1200 [name], PA-C  1 mg at 10/05/19 1227  . furosemide (LASIX) tablet 40 mg  40 mg Oral BID [name], PA-C   40 mg at 10/05/19 0820  . gabapentin (NEURONTIN) tablet 600 mg  600 mg Oral TID Skeet Simmer, RPH   600 mg at 10/05/19 1227   And  . gabapentin (NEURONTIN) tablet 1,200 mg  1,200 mg Oral QHS Skeet Simmer, RPH   1,200 mg at 10/04/19 2105  . guaiFENesin-dextromethorphan (ROBITUSSIN DM) 100-10 MG/5ML syrup 15 mL  15 mL Oral Q4H PRN Laurence Slate M, PA-C      . hydrALAZINE (APRESOLINE) injection 5 mg  5 mg Intravenous Q20 Min PRN Laurence Slate M, PA-C      . HYDROmorphone (DILAUDID) injection 0.5-1 mg  0.5-1 mg Intravenous Q2H PRN Laurence Slate M, PA-C   1 mg at 10/03/19 2321  . insulin aspart (novoLOG) injection 0-9 Units  0-9 Units Subcutaneous TID WC [name], PA-C   7 Units at 10/05/19 1227  . insulin aspart (novoLOG)  injection 10 Units  10 Units Subcutaneous Once Laurence Slate M, PA-C      . insulin aspart (novoLOG) injection 4 Units  4 Units Subcutaneous TID WC PRN [name], PA-C   4 Units at 10/04/19 1719  . insulin glargine (LANTUS) injection 50 Units  50 Units Subcutaneous Q1200 [name], PA-C   50 Units at 10/05/19 1059  . isosorbide mononitrate (IMDUR) 24 hr tablet 30 mg  30 mg Oral Daily Laurence Slate M, PA-C   30 mg at 10/05/19 [id number]  . labetalol (NORMODYNE) injection 10 mg  10 mg Intravenous Q10 min PRN [name], PA-C      . losartan (COZAAR) tablet 100 mg  100 mg Oral Q1500 Laurence Slate M, PA-C   100 mg at 10/05/19 1452  . magnesium oxide (MAG-OX) tablet 400 mg  400 mg Oral Daily Laurence Slate M, PA-C   400 mg at 10/05/19 0820  . magnesium sulfate IVPB 2 g 50 mL  2 g Intravenous Daily PRN Laurence Slate M, PA-C      . metaxalone [facility]) tablet 800 mg  800 mg Oral TID [name], PA-C   800 mg at 10/04/19 2106  . methotrexate (RHEUMATREX) tablet 5 mg  5 mg Oral 2 times per day on Wed Collins, Emma M, PA-C   5 mg at 10/05/19 1054  . metoprolol succinate (TOPROL-XL) 24 hr tablet 50 mg  50 mg Oral Daily Laurence Slate M, PA-C   50 mg at 10/05/19 [id number]  . metoprolol tartrate (LOPRESSOR) injection 2-5 mg  2-5 mg Intravenous Q2H PRN Laurence Slate M, PA-C      . mometasone-formoterol [facility]) 200-5 MCG/ACT inhaler 2 puff  2 puff Inhalation BID [name], PA-C   2 puff at 10/05/19 0859  . multivitamin with minerals tablet 1 tablet  1 tablet Oral Daily [name], PA-C   1 tablet at 10/05/19 [id number]  . nitrofurantoin (macrocrystal-monohydrate) (MACROBID) capsule 100 mg  100 mg Oral QHS Laurence Slate M, PA-C   100 mg at 10/04/19 2106  . nitroGLYCERIN (NITROSTAT) SL tablet 0.4 mg  0.4 mg Sublingual Q5 min PRN Laurence Slate M, PA-C      . nortriptyline (PAMELOR) capsule 30 mg  30 mg Oral QHS Laurence Slate M, PA-C   30 mg at 10/04/19 2106  . ondansetron (ZOFRAN) injection 4 mg  4 mg  Intravenous Q6H PRN [name], PA-C      .  ondansetron (ZOFRAN-ODT) disintegrating tablet 4 mg  4 mg Oral Q8H PRN Laurence Slate M, PA-C      . oxyCODONE (OXYCONTIN) 12 hr tablet 15 mg  15 mg Oral Q12H Laurence Slate M, PA-C   15 mg at 10/05/19 1054  . oxyCODONE-acetaminophen (PERCOCET/ROXICET) 5-325 MG per tablet 1-2 tablet  1-2 tablet Oral Q4H PRN [name], PA-C   2 tablet at 10/05/19 1452  . pantoprazole (PROTONIX) EC tablet 40 mg  40 mg Oral Daily Laurence Slate M, PA-C   40 mg at 10/05/19 [id number]  . phenol (CHLORASEPTIC) mouth spray 1 spray  1 spray Mouth/Throat PRN Laurence Slate M, PA-C      . potassium chloride (KLOR-CON) CR tablet 10 mEq  10 mEq Oral BID Laurence Slate M, PA-C   10 mEq at 10/05/19 [id number]  . potassium chloride SA (KLOR-CON) CR tablet 20-40 mEq  20-40 mEq Oral Daily PRN Laurence Slate M, PA-C      . rosuvastatin (CRESTOR) tablet 5 mg  5 mg Oral QPM Laurence Slate M, PA-C   5 mg at 10/04/19 1714  . senna (SENOKOT) tablet 8.6 mg  1 tablet Oral BID Laurence Slate M, PA-C   8.6 mg at 10/05/19 [id number]  . sodium phosphate (FLEET) 7-19 GM/118ML enema 1 enema  1 enema Rectal Once PRN [name], PA-C      . tapentadol (NUCYNTA) tablet 50 mg  50 mg Oral Q6H CollinsTerrence Dupont M, PA-C   50 mg at 10/05/19 1452     Discharge Medications: Please see discharge summary for a list of discharge medications.  Relevant Imaging Results:  Relevant Lab Results:   Additional Information SSN Prentiss [place], [place]

## 2019-10-05 NOTE — Progress Notes (Addendum)
Noted blood tinged urine in foley catheter tubing.  Patient states this has happened many times before with the last time about a month ago.   States she has not told her urologist or her PCP. Patients states she has had the catheter since about sept 2019 because she is incontinient and her husband can't take her to the bathroom that many times a day and she can't put new briefs on that many times.

## 2019-10-05 NOTE — Progress Notes (Signed)
Occupational Therapy Treatment Patient Details Name: Lauren [name] MRN: [id number] DOB: [birth year] Today's Date: 10/05/2019    History of present illness Lauren [name] is a 68 y.o. (1951/09/05) female with past medical history significant for hypertension, hyperlipidemia, insulin-dependent diabetes mellitus, severe COPD, cor pulmonale, and CAD who presents with chief complaint worsening pain R foot and great toe.  pt to OR for SFA endarterectomies   OT comments  Patient supine in bed and agreeable to OT/PT session.  Patient emotionally labile during session, requires increased time to process and is easily frustrated.  Limited by weakness, activity tolerance, balance, and R LE pain.  Requires +2 total assistance for bed mobility, EOB with min-min guard assist (increased assist dynamically) grooming with setup assist. Noted R LE stiffness, educated on positioning with patient reports "I"m too old for this, I've been through too much.". SNF remains appropriate. Will follow acutely.    Follow Up Recommendations  SNF;Supervision/Assistance - 24 hour    Equipment Recommendations  Other (comment)(TBD at next venue of care)    Recommendations for Other Services      Precautions / Restrictions Precautions Precautions: Fall Precaution Comments: right leg pain and tenderness to touch Restrictions Weight Bearing Restrictions: No Other Position/Activity Restrictions: L orthotic and shoe for OOB       Mobility Bed Mobility Overal bed mobility: Needs Assistance Bed Mobility: Supine to Sit;Sit to Supine     Supine to sit: Total assist;+2 for physical assistance;+2 for safety/equipment;HOB elevated Sit to supine: Total assist;+2 for physical assistance;+2 for safety/equipment;HOB elevated   General bed mobility comments: total assist +2 for trunk and LE management, PT making sure to use bed pad to cradle RLE vs physically touching RLE. Pt not assisting with transfer to EOB, actually  resisting with use of UEs of bedrails. VERY increased time to perform bed mobility, pt stalls.  Transfers Overall transfer level: (NT)               General transfer comment: deferred due to safety    Balance Overall balance assessment: Needs assistance Sitting-balance support: Bilateral upper extremity supported;Feet supported Sitting balance-Leahy Scale: Fair Sitting balance - Comments: sat EOB with R LE resting on pillow, L LE barely touching the floor; dynamically with posterior lean but statically min guard  Postural control: Posterior lean   Standing balance-Leahy Scale: Zero                             ADL either performed or assessed with clinical judgement   ADL Overall ADL's : Needs assistance/impaired     Grooming: Sitting;Wash/dry hands;Wash/dry face;Min guard Grooming Details (indicate cue type and reason): sitting EOB with min guard to supervision                              Functional mobility during ADLs: Total assistance;+2 for physical assistance;+2 for safety/equipment(limited to EOB only) General ADL Comments: limited due to pain, impaired balance, weakness, and cognition     Vision       Perception     Praxis      Cognition Arousal/Alertness: Awake/alert Behavior During Therapy: WFL for tasks assessed/performed;Anxious Overall Cognitive Status: Impaired/Different from baseline Area of Impairment: Attention;Following commands;Memory;Safety/judgement;Problem solving                   Current Attention Level: Sustained Memory: Decreased short-term memory Following Commands: Follows one step  commands with increased time Safety/Judgement: Decreased awareness of deficits;Decreased awareness of safety   Problem Solving: Decreased initiation;Difficulty sequencing;Requires verbal cues;Requires tactile cues General Comments: Pt instructed in short mobility cues, pt often getting distracted and stating "what do you  want me to do again?". Pt moving between multiple points of conversation, including mobility, what is on TV, and tearful periods, very difficult to follow pt.        Exercises General Exercises - Lower Extremity Long Arc Quad: AROM;Both;10 reps;Seated Hip Flexion/Marching: AROM;Both;10 reps;Seated   Shoulder Instructions       General Comments repositioned in chair position in bed with trunk pilllow to support on R trunk, heels floating     Pertinent Vitals/ Pain       Pain Assessment: Faces Faces Pain Scale: Hurts even more Pain Location: right leg Pain Descriptors / Indicators: Grimacing;Guarding;Moaning;Sore;Crying Pain Intervention(s): Limited activity within patient's tolerance;Monitored during session;Repositioned  Home Living                                          Prior Functioning/Environment              Frequency  Min 2X/week        Progress Toward Goals  OT Goals(current goals can now be found in the care plan section)  Progress towards OT goals: Not progressing toward goals - comment(limited by pain in R foot)  Acute Rehab OT Goals Patient Stated Goal: I want to go home with my husband OT Goal Formulation: With patient  Plan Discharge plan remains appropriate;Frequency remains appropriate    Co-evaluation    PT/OT/SLP Co-Evaluation/Treatment: Yes Reason for Co-Treatment: Complexity of the patient's impairments (multi-system involvement);For patient/therapist safety;Necessary to address cognition/behavior during functional activity;To address functional/ADL transfers PT goals addressed during session: Mobility/safety with mobility;Balance OT goals addressed during session: ADL's and self-care      AM-PAC OT "6 Clicks" Daily Activity     Outcome Measure   Help from another person eating meals?: None Help from another person taking care of personal grooming?: A Little Help from another person toileting, which includes using  toliet, bedpan, or urinal?: Total Help from another person bathing (including washing, rinsing, drying)?: A Lot Help from another person to put on and taking off regular upper body clothing?: A Little Help from another person to put on and taking off regular lower body clothing?: Total 6 Click Score: 14    End of Session    OT Visit Diagnosis: Other abnormalities of gait and mobility (R26.89);Muscle weakness (generalized) (M62.81);Pain Pain - Right/Left: Right Pain - part of body: Leg   Activity Tolerance Patient limited by pain   Patient Left in bed;with call bell/phone within reach;with bed alarm set   Nurse Communication Mobility status        Time: [id number] OT Time Calculation (min): 24 min  Charges: OT General Charges $OT Visit: 1 Visit OT Treatments $Self Care/Home Management : 8-22 mins  Delight Stare, OT Acute Rehabilitation Services Pager [phone] Office [phone]    Delight Stare 10/05/2019, 5:01 PM

## 2019-10-05 NOTE — Progress Notes (Addendum)
[facility] [place]  Subjective  - Hip pain is better, right foot pain is worse.   Objective (!) 134/56 91 98.3 F (36.8 C) (Oral) 17 93%  Intake/Output Summary (Last 24 hours) at 10/05/2019 [id number] Last data filed at 10/05/2019 0522 Gross per 24 hour  Intake -  Output 2650 ml  Net -2650 ml      Doppler DP/PT, toes cool to touch Right groin incision intact, ABD placed Lungs non labored  Assessment/Planning: POD # 68 y.o. female is s/p:  femaleis s/p R iliofemoral endart with vein patch angioplasty  I have asked that nursing staff to lift panus and clean groin area surround incision and place ABD pad TID to protect incision. Cont Eliquis Discharge pending SNF.  Will watch toes for demarcation and cont. To watch groin.     Lauren [name] 10/05/2019 7:22 AM --  Laboratory Lab Results: Recent Labs    10/04/19 0205  WBC 13.4*  HGB 8.8*  HCT 28.8*  PLT 527*   BMET Recent Labs    10/03/19 1255  GLUCOSE 452*    COAG Lab Results  Component Value Date   INR 1.2 09/29/2019   INR 1.1 09/15/2019   INR 1.14 08/22/2017   No results found for: PTT  I have independently interviewed and examined patient and agree with PA assessment and plan above. Groin remains tenuous without any indication for debridement but there is foul smell which might be from large panus. Allowing right toes to demarcate.  Alis Sawchuk C. Donzetta Matters, MD [facility] [place] Office: [phone] Pager: [phone]

## 2019-10-06 LAB — BASIC METABOLIC PANEL
Anion gap: 14 (ref 5–15)
BUN: 16 mg/dL (ref 8–23)
CO2: 29 mmol/L (ref 22–32)
Calcium: 9 mg/dL (ref 8.9–10.3)
Chloride: 92 mmol/L — ABNORMAL LOW (ref 98–111)
Creatinine, Ser: 1.16 mg/dL — ABNORMAL HIGH (ref 0.44–1.00)
GFR calc Af Amer: 56 mL/min — ABNORMAL LOW (ref >60)
GFR calc non Af Amer: 48 mL/min — ABNORMAL LOW (ref >60)
Glucose, Bld: 268 mg/dL — ABNORMAL HIGH (ref 70–99)
Potassium: 3.6 mmol/L (ref 3.5–5.1)
Sodium: 135 mmol/L (ref 135–145)

## 2019-10-06 LAB — CBC
HCT: 30.8 % — ABNORMAL LOW (ref 36.0–46.0)
Hemoglobin: 9.6 g/dL — ABNORMAL LOW (ref 12.0–15.0)
MCH: 27.8 pg (ref 26.0–34.0)
MCHC: 31.2 g/dL (ref 30.0–36.0)
MCV: 89.3 fL (ref 80.0–100.0)
Platelets: 546 10*3/uL — ABNORMAL HIGH (ref 150–400)
RBC: 3.45 MIL/uL — ABNORMAL LOW (ref 3.87–5.11)
RDW: 17.6 % — ABNORMAL HIGH (ref 11.5–15.5)
WBC: 14.3 10*3/uL — ABNORMAL HIGH (ref 4.0–10.5)
nRBC: 0 % (ref 0.0–0.2)

## 2019-10-06 LAB — URINALYSIS, ROUTINE W REFLEX MICROSCOPIC
Bilirubin Urine: NEGATIVE
Glucose, UA: >=500 mg/dL — AB
Ketones, ur: NEGATIVE mg/dL
Nitrite: POSITIVE — AB
Protein, ur: 100 mg/dL — AB
Specific Gravity, Urine: 1.016 (ref 1.005–1.030)
pH: 6 (ref 5.0–8.0)

## 2019-10-06 LAB — GLUCOSE, CAPILLARY
Glucose-Capillary: 247 mg/dL — ABNORMAL HIGH (ref 70–99)
Glucose-Capillary: 314 mg/dL — ABNORMAL HIGH (ref 70–99)
Glucose-Capillary: 162 mg/dL — ABNORMAL HIGH (ref 70–99)

## 2019-10-06 NOTE — TOC Progression Note (Signed)
Transition of Care [facility]) - Progression Note    Patient Details  Name: Lauren [name] MRN: [id number] Date of Birth: 1951/12/18  Transition of Care [facility]) CM/SW [place], [place] Phone Number: 10/06/2019, 1:31 PM  Clinical Narrative:     CSW called patient's spouse, [name]- gave bed offers for SNF. He will discuss SNF options  with patient's daughter and return call to CSW.  Thurmond Butts, MSW, [facility] Clinical Social Worker [phone]   Expected Discharge Plan: Skilled Nursing Facility Barriers to Discharge: Continued Medical Work up  Expected Discharge Plan and Services Expected Discharge Plan: [place]   Discharge Planning Services: CM Consult                                           Social Determinants of Health (SDOH) Interventions    Readmission Risk Interventions Readmission Risk Prevention Plan 08/07/2018  Transportation Screening Complete  Medication Review ([place]) Complete  PCP or Specialist appointment within 3-5 days of discharge Patient refused  [place] or Home Care Consult Complete  SW Recovery Care/Counseling Consult Complete  Palliative Care Screening Complete  Medication Reconcilation (Pharmacy) Complete  Skilled Nursing Facility Complete  Some recent data might be hidden

## 2019-10-06 NOTE — Progress Notes (Signed)
Inpatient Diabetes Program Recommendations  AACE/ADA: New Consensus Statement on Inpatient Glycemic Control (2015)  Target Ranges:  Prepandial:   less than 140 mg/dL      Peak postprandial:   less than 180 mg/dL (1-2 hours)      Critically ill patients:  140 - 180 mg/dL   Lab Results  Component Value Date   GLUCAP 247 (H) 10/06/2019   HGBA1C 9.4 (H) 09/13/2019    Review of Glycemic Control Results for Lauren [name], Lauren [name] (MRN [id number]) as of 10/06/2019 12:39  Ref. Range 10/05/2019 10:59 10/05/2019 16:00 10/05/2019 22:15 10/06/2019 06:47  Glucose-Capillary Latest Ref Range: 70 - 99 mg/dL 303 (H) 263 (H) 304 (H) 247 (H)   Diabetes history:Type 2 DM Outpatient Diabetes medications:Lantus 50 units @ QD, Soliqua 100-33- 60 units QAM Current orders for Inpatient glycemic control:Lantus50units QD, Novolog 0-9 units TID, Novolog 4 units TID Novolog 10 units x 1  Inpatient Diabetes Program Recommendations:   Consider: - Increasing Novolog 8 units TID (assuming patient is consuming >50% of meals). - Increasing correction to Novolog 0-15 units TID -Increasing Lantus to 60 units QD.  Thanks, Bronson Curb, MSN, RNC-OB Diabetes Coordinator [phone] (8a-5p)

## 2019-10-06 NOTE — Progress Notes (Signed)
Patient has Temp. 101.8 2 tylenol given. Butch Penny R.N. aware. Called Dr. Donnetta Hutching at 21:42  And call returned see orders for CBC and BMP in the morning.

## 2019-10-06 NOTE — Progress Notes (Addendum)
[facility] [place]  Subjective  - Pain controlled with PO medication.     Objective (!) 145/55 89 99.3 F (37.4 C) (Oral) 13 98%  Intake/Output Summary (Last 24 hours) at 10/06/2019 0731 Last data filed at 10/06/2019 0436 Gross per 24 hour  Intake 400 ml  Output 3350 ml  Net -2950 ml    Groin skin tear above the incision superficial, incision still intact.  Wipe area with saline and placed ABD in groin crease. Doppler PT, absent DP.  Toes demarcating and cool to touch. Lungs non labored breathing       Assessment/Planning: 68 y.o.femaleis s/p:  femaleis s/p R iliofemoral endart with vein patch angioplasty Leukocytosis 14.5 with Tm of 101.8 Tylenol given T 99.3 Will draw UA she has a history of UTI    Roxy Horseman 10/06/2019 7:31 AM --  Laboratory Lab Results: Recent Labs    10/04/19 0205 10/06/19 0303  WBC 13.4* 14.3*  HGB 8.8* 9.6*  HCT 28.8* 30.8*  PLT 527* 546*   BMET Recent Labs    10/03/19 1255 10/06/19 0303  NA  --  135  K  --  3.6  CL  --  92*  CO2  --  29  GLUCOSE 452* 268*  BUN  --  16  CREATININE  --  1.16*  CALCIUM  --  9.0    COAG Lab Results  Component Value Date   INR 1.2 09/29/2019   INR 1.1 09/15/2019   INR 1.14 08/22/2017   No results found for: PTT  I have independently interviewed and examined patient and agree with PA assessment and plan above.   Llesenia Fogal C. Donzetta Matters, MD [facility] [place] Office: [phone] Pager: [phone]

## 2019-10-07 LAB — GLUCOSE, CAPILLARY
Glucose-Capillary: 187 mg/dL — ABNORMAL HIGH (ref 70–99)
Glucose-Capillary: 254 mg/dL — ABNORMAL HIGH (ref 70–99)
Glucose-Capillary: 263 mg/dL — ABNORMAL HIGH (ref 70–99)
Glucose-Capillary: 311 mg/dL — ABNORMAL HIGH (ref 70–99)

## 2019-10-07 MED ORDER — INSULIN ASPART 100 UNIT/ML ~~LOC~~ SOLN
10.0000 [IU] | Freq: Once | SUBCUTANEOUS | Status: AC
  Administered 2019-10-07: 10 [IU] via SUBCUTANEOUS

## 2019-10-07 MED ORDER — OXYCODONE-ACETAMINOPHEN 5-325 MG PO TABS: 1.0000 | ORAL_TABLET | ORAL | 0 refills | Status: AC | PRN

## 2019-10-07 MED ORDER — CIPROFLOXACIN HCL 500 MG PO TABS
500.0000 mg | ORAL_TABLET | Freq: Two times a day (BID) | ORAL | Status: DC
  Administered 2019-10-07 – 2019-10-09 (×5): 500 mg via ORAL
  Filled 2019-10-07 (×5): qty 1

## 2019-10-07 MED ORDER — PNEUMOCOCCAL VAC POLYVALENT 25 MCG/0.5ML IJ INJ: 0.5000 mL | INJECTION | INTRAMUSCULAR | Status: DC

## 2019-10-07 MED ORDER — INSULIN ASPART 100 UNIT/ML ~~LOC~~ SOLN
0.0000 [IU] | Freq: Three times a day (TID) | SUBCUTANEOUS | Status: DC
  Administered 2019-10-08 (×2): 11 [IU] via SUBCUTANEOUS
  Administered 2019-10-09: 4 [IU] via SUBCUTANEOUS
  Administered 2019-10-09: 7 [IU] via SUBCUTANEOUS
  Administered 2019-10-09: 11 [IU] via SUBCUTANEOUS
  Administered 2019-10-10 (×2): 4 [IU] via SUBCUTANEOUS
  Administered 2019-10-10: 11 [IU] via SUBCUTANEOUS
  Administered 2019-10-11 (×3): 4 [IU] via SUBCUTANEOUS
  Administered 2019-10-12 (×2): 11 [IU] via SUBCUTANEOUS
  Administered 2019-10-12: 17:00:00 7 [IU] via SUBCUTANEOUS
  Administered 2019-10-13: 4 [IU] via SUBCUTANEOUS
  Administered 2019-10-13: 18:00:00 7 [IU] via SUBCUTANEOUS
  Administered 2019-10-14: 11 [IU] via SUBCUTANEOUS
  Administered 2019-10-14 – 2019-10-15 (×3): 4 [IU] via SUBCUTANEOUS
  Administered 2019-10-16: 8 [IU] via SUBCUTANEOUS
  Administered 2019-10-16: 11 [IU] via SUBCUTANEOUS
  Administered 2019-10-17: 18:00:00 7 [IU] via SUBCUTANEOUS
  Administered 2019-10-18 – 2019-10-19 (×2): 3 [IU] via SUBCUTANEOUS
  Administered 2019-10-19: 11 [IU] via SUBCUTANEOUS
  Administered 2019-10-19 – 2019-10-20 (×2): 4 [IU] via SUBCUTANEOUS
  Administered 2019-10-20: 7 [IU] via SUBCUTANEOUS
  Administered 2019-10-20: 4 [IU] via SUBCUTANEOUS
  Administered 2019-10-21: 3 [IU] via SUBCUTANEOUS
  Administered 2019-10-21 – 2019-10-22 (×2): 4 [IU] via SUBCUTANEOUS
  Administered 2019-10-22 – 2019-10-23 (×4): 7 [IU] via SUBCUTANEOUS
  Administered 2019-10-24: 4 [IU] via SUBCUTANEOUS
  Administered 2019-10-24: 3 [IU] via SUBCUTANEOUS
  Administered 2019-10-25: 7 [IU] via SUBCUTANEOUS
  Administered 2019-10-25: 3 [IU] via SUBCUTANEOUS
  Administered 2019-10-25: 12:00:00 7 [IU] via SUBCUTANEOUS
  Administered 2019-10-26 – 2019-10-29 (×7): 3 [IU] via SUBCUTANEOUS
  Administered 2019-10-30 (×2): 4 [IU] via SUBCUTANEOUS
  Administered 2019-10-30: 3 [IU] via SUBCUTANEOUS
  Administered 2019-10-31: 4 [IU] via SUBCUTANEOUS
  Administered 2019-11-01 – 2019-11-02 (×2): 3 [IU] via SUBCUTANEOUS
  Administered 2019-11-03: 7 [IU] via SUBCUTANEOUS
  Administered 2019-11-03 (×2): 3 [IU] via SUBCUTANEOUS
  Administered 2019-11-04 – 2019-11-05 (×4): 4 [IU] via SUBCUTANEOUS
  Administered 2019-11-05 (×2): 3 [IU] via SUBCUTANEOUS
  Administered 2019-11-06: 7 [IU] via SUBCUTANEOUS
  Administered 2019-11-06: 3 [IU] via SUBCUTANEOUS
  Administered 2019-11-06: 18:00:00 4 [IU] via SUBCUTANEOUS
  Administered 2019-11-07: 7 [IU] via SUBCUTANEOUS
  Administered 2019-11-07 – 2019-11-08 (×3): 4 [IU] via SUBCUTANEOUS
  Administered 2019-11-08: 7 [IU] via SUBCUTANEOUS
  Administered 2019-11-09 (×3): 3 [IU] via SUBCUTANEOUS
  Administered 2019-11-10: 4 [IU] via SUBCUTANEOUS
  Administered 2019-11-11: 7 [IU] via SUBCUTANEOUS
  Administered 2019-11-11: 3 [IU] via SUBCUTANEOUS
  Administered 2019-11-11: 7 [IU] via SUBCUTANEOUS
  Administered 2019-11-12: 4 [IU] via SUBCUTANEOUS
  Administered 2019-11-12 (×2): 7 [IU] via SUBCUTANEOUS
  Administered 2019-11-13 – 2019-11-15 (×5): 4 [IU] via SUBCUTANEOUS
  Administered 2019-11-16: 3 [IU] via SUBCUTANEOUS
  Administered 2019-11-16 – 2019-11-17 (×2): 4 [IU] via SUBCUTANEOUS

## 2019-10-07 NOTE — TOC Progression Note (Signed)
Transition of Care [facility]) - Progression Note    Patient Details  Name: TANESHA VULLO MRN: [id number] Date of Birth: 10-Oct-1951  Transition of Care [facility]) CM/SW [place], [place] Phone Number: 10/07/2019, 4:38 PM  Clinical Narrative:     CSW spoke with the patient's spouse, the family has selected [facility] for SNF.  CSW contacted Juliann Pulse, with [facility] waiting on confirmation on bed offer.  Thurmond Butts, MSW, [facility] Clinical Social Worker [phone]    Expected Discharge Plan: Skilled Nursing Facility Barriers to Discharge: Continued Medical Work up  Expected Discharge Plan and Services Expected Discharge Plan: [place]   Discharge Planning Services: CM Consult                                           Social Determinants of Health (SDOH) Interventions    Readmission Risk Interventions Readmission Risk Prevention Plan 08/07/2018  Transportation Screening Complete  Medication Review ([place]) Complete  PCP or Specialist appointment within 3-5 days of discharge Patient refused  [place] or Home Care Consult Complete  SW Recovery Care/Counseling Consult Complete  Palliative Care Screening Complete  Medication Reconcilation (Pharmacy) Complete  Skilled Nursing Facility Complete  Some recent data might be hidden

## 2019-10-07 NOTE — Discharge Summary (Signed)
Vascular and Vein Specialists Discharge Summary   Patient ID:  Lauren [name] MRN: [id number] DOB/AGE: 03-Aug-1951 68 y.o.  Admit date: 09/29/2019 Discharge date: 10/07/2019 Date of Surgery: 09/29/2019 Surgeon: Surgeon(s): [name], MD  Admission Diagnosis: PAD (peripheral artery disease) [facility] [I73.9]  Discharge Diagnoses:  PAD (peripheral artery disease) ([place]) [I73.9]  Secondary Diagnoses: Past Medical History:  Diagnosis Date  . Adenomatous colon polyp   . Anemia   . Anxiety   . AVM (arteriovenous malformation)    cecum  . CAD (coronary artery disease)   . Carotid artery occlusion   . Cataract    bil cateracts removed  . CHF (congestive heart failure) ([place])   . COPD (chronic obstructive pulmonary disease) ([place])   . Cough   . DDD (degenerative disc disease), lumbar   . Depression   . Diabetes mellitus    Type II  . DVT (deep venous thrombosis) ([place])    RLE 05/03/19  . Elevated LFTs   . Endometrial ca [facility])    endometrial ca dx 4/11  . Foot drop   . GERD (gastroesophageal reflux disease)   . Gout 2015  . Hernia of flank   . Hydronephrosis   . Hyperlipidemia   . Hypertension   . Insomnia   . Neuropathy   . OAB (overactive bladder)   . Osteoarthritis   . Peripheral neuropathy    following chemotherapy  . Peripheral vascular disease ([place])   . Pneumonia 07/2017  . Psoriasis   . Pulmonary nodule    since 2016  . Radiation 05/01/2011-06/11/11   5600 cGy 28 fxs/periaortic  . Stroke [facility]) 2015   tia- Patient denies  . Thyroid nodule   . TIA (transient ischemic attack) 2015  . 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 Uterine cancer ([place])   . Ventral hernia    postoperative since 2009    Procedure(s): ENDARTERECTOMY FEMORAL with vein patch angioplast.  Discharged Condition: stable  HPI: Lauren [name] is a 68 y.o. (06-14-1951) female with past medical history significant for hypertension, hyperlipidemia,  insulin-dependent diabetes mellitus, severe COPD, cor pulmonale, and CAD who presents with chief complaint worsening pain R foot and great toe.  Surgical history significant for right carotid endarterectomy in 2016 by Dr. Kellie Simmering.  Patient is also had left SFA and popliteal stenting by Dr. Donzetta Matters in July 2019.  She describes over the past 2 months that she has had occasional rest pain of right foot which has been increasingly painful and more regular over the past 2 weeks.  She has bilateral foot drop and admittedly does not walk anymore.  She is however able to transfer from chair with some assist.  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   She underwent aortogram 09/15/2019 by Dr. Donzetta Matters.  Findings: The aorta and iliac segments appear again diminutive she has significant bowel gas but the runoff is non-limited.  The right lower extremity there appears to be near occlusion of the common femoral artery at the profunda femoris takeoff.  The SFA is diminutive but there is no greater than 50% stenosis and dominant runoff is via the posterior tibial artery.  Pullback gradient was approximately 40 mmHg across the common femoral artery.  Patient will need right common femoral endarterectomy.  Hospital Course:  Lauren [name] is a 68 y.o. female is S/P Right Procedure(s): ENDARTERECTOMY FEMORAL with vein patch angioplast.   Our biggest concern is incisional care.  She has large panus and large hernia so we place ABD pad at least 3 times a day to keep the incision clean and dry with soap and water cleaning at each dressing change.    She has a chronic indwelling foley catheter last exchanged 07/31/2019 and is on Macrobid daily.  We will exchange the foley today 10/07/2019.  She does not ambulate and can stand pivot with a 2 person assit.  Her left foot is showing ischemic changes to the GT and all toes are cool to touch.  We are watching her left foot demarcate and if it becomes infected or the pain is  intolerable no further intervention is needed now.    UTI will treat with oral Ciprofloxacin until D/C.     Significant Diagnostic Studies: CBC Lab Results  Component Value Date   WBC 14.3 (H) 10/06/2019   HGB 9.6 (L) 10/06/2019   HCT 30.8 (L) 10/06/2019   MCV 89.3 10/06/2019   PLT 546 (H) 10/06/2019    BMET    Component Value Date/Time   NA 135 10/06/2019 0303   NA 142 01/28/2018 1035   K 3.6 10/06/2019 0303   CL 92 (L) 10/06/2019 0303   CO2 29 10/06/2019 0303   GLUCOSE 268 (H) 10/06/2019 0303   BUN 16 10/06/2019 0303   BUN 19 01/28/2018 1035   BUN 14.7 07/18/2014 1142   CREATININE 1.16 (H) 10/06/2019 0303   CREATININE 1.0 07/18/2014 1142   CALCIUM 9.0 10/06/2019 0303   GFRNONAA 48 (L) 10/06/2019 0303   GFRAA 56 (L) 10/06/2019 0303   COAG Lab Results  Component Value Date   INR 1.2 09/29/2019   INR 1.1 09/15/2019   INR 1.14 08/22/2017     Disposition:  Discharge to :Skilled nursing facility Discharge Instructions    Call MD for:  redness, tenderness, or signs of infection (pain, swelling, bleeding, redness, odor or green/yellow discharge around incision site)   Complete by: As directed    Call MD for:  severe or increased pain, loss or decreased feeling  in affected limb(s)   Complete by: As directed    Call MD for:  temperature >100.5   Complete by: As directed    Resume previous diet   Complete by: As directed      Allergies as of 10/07/2019      Reactions   Ambien [zolpidem Tartrate] Other (See Comments)   Per the pt, "it makes me go crazy"   Sulfa Antibiotics Rash   Ativan [lorazepam] Other (See Comments)   Patient has the opposite effect when taking medication    Elavil [amitriptyline Hcl] Other (See Comments)   Gastritis   Prozac [fluoxetine Hcl] Anxiety, Other (See Comments)   irritability      Medication List    TAKE these medications   amLODipine 5 MG tablet Commonly known as: NORVASC Take 1 tablet (5 mg total) by mouth daily.    betamethasone dipropionate 0.05 % ointment Commonly known as: DIPROLENE Apply 1 application topically 2 (two) times daily as needed (psoriasis).   Eliquis 5 MG Tabs tablet Generic drug: apixaban Take 5 mg by mouth 2 (two) times daily.   escitalopram 20 MG tablet Commonly known as: LEXAPRO Take 20 mg by mouth daily.   esomeprazole 40 MG capsule Commonly known as: NEXIUM Take 40 mg by mouth every evening.   ferrous sulfate 325 (65 FE) MG tablet Take 325 mg by mouth daily.   Fluticasone-Salmeterol 250-50 MCG/DOSE Aepb Commonly known as: ADVAIR  Inhale 1 puff into the lungs 2 (two) times daily.   folic acid [id number] MCG tablet Commonly known as: FOLVITE Take 800 mcg by mouth daily at 12 noon.   furosemide 40 MG tablet Commonly known as: LASIX Take 40 mg by mouth 2 (two) times daily.   gabapentin 600 MG tablet Commonly known as: NEURONTIN Take 600-1,200 mg by mouth See admin instructions. Take 600 mg by mouth in the morning, afternoon and evening and 1200 mg at bedtime   insulin glargine 100 UNIT/ML injection Commonly known as: LANTUS Inject 50 Units into the skin daily at 12 noon.   isosorbide mononitrate 30 MG 24 hr tablet Commonly known as: IMDUR Take 1 tablet by mouth once daily   losartan 100 MG tablet Commonly known as: COZAAR Take 100 mg by mouth daily at 3 pm.   magnesium oxide 400 MG tablet Commonly known as: MAG-OX Take 400 mg by mouth daily.   Melatonin 10 MG Tabs Take 10 mg by mouth at bedtime.   metaxalone 800 MG tablet Commonly known as: SKELAXIN Take 800 mg by mouth 3 (three) times daily. 0800, 1200, 1600   methotrexate 2.5 MG tablet Take 5 mg by mouth See admin instructions. Take 5 mg by mouth twice a day on Wednesdays   metoprolol succinate 50 MG 24 hr tablet Commonly known as: TOPROL-XL Take 50 mg by mouth daily.   multivitamin with minerals Tabs tablet Take 1 tablet by mouth daily.   nitrofurantoin (macrocrystal-monohydrate) 100 MG  capsule Commonly known as: MACROBID Take 100 mg by mouth at bedtime.   nitroGLYCERIN 0.4 MG SL tablet Commonly known as: Nitrostat Place 1 tablet (0.4 mg total) under the tongue every 5 (five) minutes as needed for chest pain.   nortriptyline 10 MG capsule Commonly known as: PAMELOR Take 30 mg by mouth at bedtime.   ondansetron 4 MG disintegrating tablet Commonly known as: ZOFRAN-ODT Take 4 mg by mouth every 8 (eight) hours as needed for nausea or vomiting.   oxyCODONE-acetaminophen 5-325 MG tablet Commonly known as: PERCOCET/ROXICET Take 1 tablet by mouth every 4 (four) hours as needed for moderate pain.   OxyCONTIN 15 mg 12 hr tablet Generic drug: oxyCODONE Take 1 tablet (15 mg total) by mouth every 12 (twelve) hours.   potassium chloride 10 MEQ CR capsule Commonly known as: MICRO-K Take 10 mEq by mouth 2 (two) times daily.   ProAir RespiClick [id number] (90 Base) MCG/ACT Aepb Generic drug: Albuterol Sulfate Inhale 2 puffs into the lungs every 6 (six) hours as needed (congestion, cough, wheezing).   RELION INSULIN SYRINGE 1ML/31G 31G X 5/16" 1 ML Misc Generic drug: Insulin Syringe-Needle U-100 See admin instructions.   rosuvastatin 5 MG tablet Commonly known as: CRESTOR Take 5 mg by mouth every evening.   senna 8.6 MG tablet Commonly known as: SENOKOT Take 1 tablet by mouth 2 (two) times daily.   Soliqua 100-33 UNT-MCG/ML Sopn Generic drug: Insulin Glargine-Lixisenatide Inject 60 Units into the skin every morning.   tapentadol 50 MG tablet Commonly known as: Nucynta Take 1 tablet (50 mg total) by mouth every 6 (six) hours. 6 am, noon, 6pm  And midnight What changed: additional instructions   vitamin B-12 1000 MCG tablet Commonly known as: CYANOCOBALAMIN Take 1,000 mcg by mouth daily.   Vitamin D3 25 MCG (1000 UT) Caps Take 1,000 Units by mouth daily.      Verbal and written Discharge instructions given to the patient. Wound care per Discharge AVS Follow-up  Information  [name], MD Follow up in 2 week(s).   Specialties: Vascular Surgery, Cardiology Why: office will call Contact information: [place] [address] [phone]           Signed: Roxy Horseman 10/07/2019, 8:56 AM

## 2019-10-07 NOTE — Care Management Important Message (Signed)
Important Message  Patient Details  Name: SABRIYA NORA MRN: [id number] Date of Birth: 08-05-1951   Medicare Important Message Given:  Yes     [name] 10/07/2019, 1:23 PM

## 2019-10-07 NOTE — Progress Notes (Addendum)
Vascular and Vein Specialists of [place]  Subjective  - No new complaints.   Objective (!) 141/54 98 100.1 F (37.8 C) (Oral) 19 100%  Intake/Output Summary (Last 24 hours) at 10/07/2019 0733 Last data filed at 10/07/2019 [id number] Gross per 24 hour  Intake 420 ml  Output 2775 ml  Net -2355 ml   Right groin incision intact, dry ABD placed Left foot continues to be cool at the toes and erythema with dusky appearance to the GT. Doppler PT, toe demarcating cool to touch Lungs non labored breathing   Assessment/Planning: 68 y.o.femaleis s/p: femaleis s/p R iliofemoral endart with vein patch angioplasty  Left groin incision remains intact.  Cont ABD changes at least 3 times a day. UTI + will start treatment with cipro, she is on chronic Macrobid and has chronic indwelling foley catheter that is changed on a schedule. I will ask the nurses to call her husband to find out.   Pending SNF placement  Roxy Horseman 10/07/2019 7:33 AM --  Laboratory Lab Results: Recent Labs    10/06/19 0303  WBC 14.3*  HGB 9.6*  HCT 30.8*  PLT 546*   BMET Recent Labs    10/06/19 0303  NA 135  K 3.6  CL 92*  CO2 29  GLUCOSE 268*  BUN 16  CREATININE 1.16*  CALCIUM 9.0    COAG Lab Results  Component Value Date   INR 1.2 09/29/2019   INR 1.1 09/15/2019   INR 1.14 08/22/2017   No results found for: PTT   I have interviewed and examined patient with PA and agree with assessment and plan above.   Brandon C. Donzetta Matters, MD [facility] Specialists of [place] Office: [phone] Pager: [phone]

## 2019-10-08 LAB — CBC
HCT: 26.3 % — ABNORMAL LOW (ref 36.0–46.0)
Hemoglobin: 8 g/dL — ABNORMAL LOW (ref 12.0–15.0)
MCH: 27 pg (ref 26.0–34.0)
MCHC: 30.4 g/dL (ref 30.0–36.0)
MCV: 88.9 fL (ref 80.0–100.0)
Platelets: 526 10*3/uL — ABNORMAL HIGH (ref 150–400)
RBC: 2.96 MIL/uL — ABNORMAL LOW (ref 3.87–5.11)
RDW: 17.8 % — ABNORMAL HIGH (ref 11.5–15.5)
WBC: 12.7 10*3/uL — ABNORMAL HIGH (ref 4.0–10.5)
nRBC: 0 % (ref 0.0–0.2)

## 2019-10-08 LAB — GLUCOSE, CAPILLARY
Glucose-Capillary: 155 mg/dL — ABNORMAL HIGH (ref 70–99)
Glucose-Capillary: 264 mg/dL — ABNORMAL HIGH (ref 70–99)
Glucose-Capillary: 226 mg/dL — ABNORMAL HIGH (ref 70–99)
Glucose-Capillary: 184 mg/dL — ABNORMAL HIGH (ref 70–99)

## 2019-10-08 MED ORDER — INFLUENZA VAC SPLIT QUAD 0.5 ML IM SUSY: 0.5000 mL | PREFILLED_SYRINGE | INTRAMUSCULAR | Status: DC

## 2019-10-08 NOTE — Progress Notes (Signed)
Vascular and Vein Specialists of [place]  Subjective  - feels ok   Objective 104/66 87 99.4 F (37.4 C) (Oral) 17 96%  Intake/Output Summary (Last 24 hours) at 10/08/2019 0811 Last data filed at 10/08/2019 0430 Gross per 24 hour  Intake 480 ml  Output 1475 ml  Net -995 ml   DP PT doppler Still with baseline erythema groin no real drainage  Assessment/Planning: S/p fem endart tenuous groin incision Continue dry gauze in groin  Ruta Hinds 10/08/2019 8:11 AM --  Laboratory Lab Results: Recent Labs    10/06/19 0303 10/08/19 0303  WBC 14.3* 12.7*  HGB 9.6* 8.0*  HCT 30.8* 26.3*  PLT 546* 526*   BMET Recent Labs    10/06/19 0303  NA 135  K 3.6  CL 92*  CO2 29  GLUCOSE 268*  BUN 16  CREATININE 1.16*  CALCIUM 9.0    COAG Lab Results  Component Value Date   INR 1.2 09/29/2019   INR 1.1 09/15/2019   INR 1.14 08/22/2017   No results found for: PTT

## 2019-10-09 LAB — GLUCOSE, CAPILLARY
Glucose-Capillary: 174 mg/dL — ABNORMAL HIGH (ref 70–99)
Glucose-Capillary: 265 mg/dL — ABNORMAL HIGH (ref 70–99)
Glucose-Capillary: 216 mg/dL — ABNORMAL HIGH (ref 70–99)
Glucose-Capillary: 229 mg/dL — ABNORMAL HIGH (ref 70–99)

## 2019-10-09 MED ORDER — PIPERACILLIN-TAZOBACTAM 3.375 G IVPB: 3.3750 g | Freq: Four times a day (QID) | INTRAVENOUS | Status: DC

## 2019-10-09 MED ORDER — PNEUMOCOCCAL VAC POLYVALENT 25 MCG/0.5ML IJ INJ
0.5000 mL | INJECTION | INTRAMUSCULAR | Status: AC
  Administered 2019-11-09: 0.5 mL via INTRAMUSCULAR
  Filled 2019-10-09: qty 0.5

## 2019-10-09 MED ORDER — PIPERACILLIN-TAZOBACTAM 3.375 G IVPB
3.3750 g | Freq: Three times a day (TID) | INTRAVENOUS | Status: DC
  Administered 2019-10-09 – 2019-10-14 (×14): 3.375 g via INTRAVENOUS
  Filled 2019-10-09 (×12): qty 50

## 2019-10-09 MED ORDER — VANCOMYCIN HCL 10 G IV SOLR
2000.0000 mg | Freq: Once | INTRAVENOUS | Status: AC
  Administered 2019-10-09: 2000 mg via INTRAVENOUS
  Filled 2019-10-09: qty 2000

## 2019-10-09 MED ORDER — INFLUENZA VAC SPLIT QUAD 0.5 ML IM SUSY
0.5000 mL | PREFILLED_SYRINGE | INTRAMUSCULAR | Status: AC
  Administered 2019-11-09: 0.5 mL via INTRAMUSCULAR
  Filled 2019-10-09: qty 0.5

## 2019-10-09 MED ORDER — VANCOMYCIN HCL IN DEXTROSE 1-5 GM/200ML-% IV SOLN
1000.0000 mg | INTRAVENOUS | Status: DC
  Administered 2019-10-10 – 2019-10-13 (×3): 1000 mg via INTRAVENOUS
  Filled 2019-10-09 (×5): qty 200

## 2019-10-09 NOTE — Progress Notes (Signed)
Pharmacy Antibiotic Note  Lauren [name] is a 68 y.o. female admitted on 09/29/2019 with wound infection.  Patient's right groin now with 2 cm skin necrosis around incision with clear drainage, planning for debridement and VAC placement 10/13. Pharmacy has been consulted for Vancomycin dosing.  WBC yesterday elevated but trending down. Currently afebrile but did spike fever with Tmax 101.4. Renal function last checked 10/8 but stable.   Plan: Vancomycin 2000 mg IV once, then 1000 mg IV Q 24 hrs. Goal AUC 400-550. - Expected AUC: 511 - SCr used: 1.16 - Vd used: 0.5 Zosyn 3.375g IV q8h (4 hour infusion) per MD After discussion with Dr. Oneida Alar, will discontinue Ciprofloxacin and chronic Macrobid at this time Monitor renal function and clinical progression Vancomycin levels at steady state F/u ABX LOT  Height: 5' 7.5" (171.5 cm) Weight: 205 lb 4 oz (93.1 kg) IBW/kg (Calculated) : 62.75  Temp (24hrs), Avg:99.3 F (37.4 C), Min:98.4 F (36.9 C), Max:101.4 F (38.6 C)  Recent Labs  Lab 10/04/19 0205 10/06/19 0303 10/08/19 0303  WBC 13.4* 14.3* 12.7*  CREATININE  --  1.16*  --     Estimated Creatinine Clearance: 54.1 mL/min (A) (by C-G formula based on SCr of 1.16 mg/dL (H)).    Allergies  Allergen Reactions  . Ambien [Zolpidem Tartrate] Other (See Comments)    Per the pt, "it makes me go crazy"  . Sulfa Antibiotics Rash  . Ativan [Lorazepam] Other (See Comments)    Patient has the opposite effect when taking medication   . Elavil [Amitriptyline Hcl] Other (See Comments)    Gastritis  . Prozac [Fluoxetine Hcl] Anxiety and Other (See Comments)    irritability     Antimicrobials this admission: Macrobid 10/1 >> 10/11 Ciprofloxacin 10/9 >> 10/11 Vancomycin 10/11 >> Zosyn 10/11 >>  Microbiology results: None  Richardine Service, PharmD PGY1 Pharmacy Resident Phone: [phone] 10/09/2019  11:56 AM  Please check [email or web address] for unit-specific pharmacy phone numbers.

## 2019-10-09 NOTE — Progress Notes (Signed)
[facility] Vein Specialists of [place]  Subjective  - drainage smells bad   Objective (!) 118/46 87 98.8 F (37.1 C) (Oral) 17 94%  Intake/Output Summary (Last 24 hours) at 10/09/2019 1115 Last data filed at 10/09/2019 0908 Gross per 24 hour  Intake 500 ml  Output 700 ml  Net -200 ml   Doppler signals in foot Right groin now with 2 cm skin necrosis around the entire incision with clear drainage  Assessment/Planning: Right groin incisional necrosis Will start IV antibiotics today Will need right groin debridement and VAC placement Will schedule with Dr Donzetta Matters on Tuesday   Ruta Hinds 10/09/2019 11:15 AM --  Laboratory Lab Results: Recent Labs    10/08/19 0303  WBC 12.7*  HGB 8.0*  HCT 26.3*  PLT 526*   BMET No results for input(s): NA, K, CL, CO2, GLUCOSE, BUN, CREATININE, CALCIUM in the last 72 hours.  COAG Lab Results  Component Value Date   INR 1.2 09/29/2019   INR 1.1 09/15/2019   INR 1.14 08/22/2017   No results found for: PTT

## 2019-10-09 NOTE — Consult Note (Signed)
[place] Nurse wound consult note Reason for Consult: right groin wound.  Patient seen by Dr.Fields (Vascular Surgery) this morning and his note indicates that patient will require right groin debridement and VAC dressing placement on Tuesday. She has been scheduled with Dr. Donzetta Matters.  [place] nursing team will not follow, but will remain available to this patient, the nursing and medical teams post procedure.  Please re-consult if needed. Thanks, Maudie Flakes, MSN, RN, [place], Arther Abbott  Pager# [phone]

## 2019-10-09 NOTE — TOC Progression Note (Signed)
Transition of Care [facility]) - Progression Note    Patient Details  Name: Lauren [name] MRN: [id number] Date of Birth: November 01, 1951  [facility]) CM/SW [place], [place] Phone Number: [phone] 10/09/2019, 11:17 AM  Clinical Narrative:     CSW spoke with Amy at [facility] and she stated that facility has a bed for patient. Amy informed CSW that she will need a COVID 48 prior to discharge. CSW called RN Shanon Brow and alerted him of bed status and the need for a COVID test. RN informed CSW that patient in not medically stable for discharge.  TOC team will continue to monitor for discharge planning.   Expected Discharge Plan: Skilled Nursing Facility Barriers to Discharge: Continued Medical Work up  Expected Discharge Plan and Services Expected Discharge Plan: [place]   Discharge Planning Services: CM Consult                                           Social Determinants of Health (SDOH) Interventions    Readmission Risk Interventions Readmission Risk Prevention Plan 08/07/2018  Transportation Screening Complete  Medication Review ([place]) Complete  PCP or Specialist appointment within 3-5 days of discharge Patient refused  [place] or Home Care Consult Complete  SW Recovery Care/Counseling Consult Complete  Palliative Care Screening Complete  Medication Reconcilation (Pharmacy) Complete  Skilled Nursing Facility Complete  Some recent data might be hidden

## 2019-10-10 LAB — BASIC METABOLIC PANEL
Anion gap: 12 (ref 5–15)
BUN: 23 mg/dL (ref 8–23)
CO2: 26 mmol/L (ref 22–32)
Calcium: 8.6 mg/dL — ABNORMAL LOW (ref 8.9–10.3)
Chloride: 95 mmol/L — ABNORMAL LOW (ref 98–111)
Creatinine, Ser: 1.34 mg/dL — ABNORMAL HIGH (ref 0.44–1.00)
GFR calc Af Amer: 47 mL/min — ABNORMAL LOW (ref >60)
GFR calc non Af Amer: 40 mL/min — ABNORMAL LOW (ref >60)
Glucose, Bld: 185 mg/dL — ABNORMAL HIGH (ref 70–99)
Potassium: 3.7 mmol/L (ref 3.5–5.1)
Sodium: 133 mmol/L — ABNORMAL LOW (ref 135–145)

## 2019-10-10 LAB — GLUCOSE, CAPILLARY
Glucose-Capillary: 176 mg/dL — ABNORMAL HIGH (ref 70–99)
Glucose-Capillary: 241 mg/dL — ABNORMAL HIGH (ref 70–99)
Glucose-Capillary: 195 mg/dL — ABNORMAL HIGH (ref 70–99)
Glucose-Capillary: 207 mg/dL — ABNORMAL HIGH (ref 70–99)

## 2019-10-10 LAB — CBC
HCT: 25.6 % — ABNORMAL LOW (ref 36.0–46.0)
Hemoglobin: 7.8 g/dL — ABNORMAL LOW (ref 12.0–15.0)
MCH: 27.2 pg (ref 26.0–34.0)
MCHC: 30.5 g/dL (ref 30.0–36.0)
MCV: 89.2 fL (ref 80.0–100.0)
Platelets: 535 10*3/uL — ABNORMAL HIGH (ref 150–400)
RBC: 2.87 MIL/uL — ABNORMAL LOW (ref 3.87–5.11)
RDW: 17.7 % — ABNORMAL HIGH (ref 11.5–15.5)
WBC: 14.6 10*3/uL — ABNORMAL HIGH (ref 4.0–10.5)
nRBC: 0 % (ref 0.0–0.2)

## 2019-10-10 NOTE — Progress Notes (Addendum)
[facility]ists of [place]  Subjective  - Pain in the right foot.   Objective 113/73 87 98.9 F (37.2 C) (Oral) 14 97%  Intake/Output Summary (Last 24 hours) at 10/10/2019 [id number] Last data filed at 10/10/2019 [id number] Gross per 24 hour  Intake 600 ml  Output 1410 ml  Net -810 ml    Right GT dusky, remaining toes erythema Doppler PT Right groin malodor  Assessment/Planning: POD # 11 right femoral, profunda and SFA endarterectomies with vein patch angioplasty  Will have DR. Donzetta Matters examine the right groin.  Plan to return to the OR tomorrow for I & D with possible wound vac placement. I have not pre-op'd the patient yet.   Roxy Horseman 10/10/2019 7:14 AM --  Laboratory Lab Results: Recent Labs    10/08/19 0303 10/10/19 0437  WBC 12.7* 14.6*  HGB 8.0* 7.8*  HCT 26.3* 25.6*  PLT 526* 535*   BMET Recent Labs    10/10/19 0437  NA 133*  K 3.7  CL 95*  CO2 26  GLUCOSE 185*  BUN 23  CREATININE 1.34*  CALCIUM 8.6*    COAG Lab Results  Component Value Date   INR 1.2 09/29/2019   INR 1.1 09/15/2019   INR 1.14 08/22/2017   No results found for: PTT   I have independently interviewed and examined patient and agree with PA assessment and plan above.  Plan will be for right groin debridement probable wound VAC placement and right great toe amputation tomorrow in the OR.  Patient to be n.p.o. past midnight.  Gaelen Brager C. Donzetta Matters, MD [facility] of [place] Office: [phone] Pager: [phone]

## 2019-10-10 NOTE — Progress Notes (Signed)
Physical Therapy Treatment Patient Details Name: Lauren [name] MRN: [id number] DOB: 02-28-1951 Today's Date: 10/10/2019    History of Present Illness Lauren [name] is a 68 y.o. (1951-03-31) female with past medical history significant for hypertension, hyperlipidemia, insulin-dependent diabetes mellitus, severe COPD, cor pulmonale, and CAD who presents with chief complaint worsening pain R foot and great toe.  pt to OR for SFA endarterectomies    PT Comments    Pt was seen for gentle bed ex's with focus on increasing ROM to her legs.  Pt is having some disuse issues of ankle ROM resulting in very strong PF posture of ankles.  Worked with her on this ROM along with comfort of LE positioning at the end.  Follow acutely and hopefully will be more able to do some sitting balance control work next session.   Follow Up Recommendations  SNF     Equipment Recommendations  None recommended by PT    Recommendations for Other Services       Precautions / Restrictions Restrictions Weight Bearing Restrictions: Yes RLE Weight Bearing: Non weight bearing LLE Weight Bearing: Non weight bearing Other Position/Activity Restrictions: L orthotic and shoe for OOB    Mobility  Bed Mobility Overal bed mobility: Needs Assistance             General bed mobility comments: declined OOB  Transfers                    Ambulation/Gait                 Stairs             Wheelchair Mobility    Modified Rankin (Stroke Patients Only)       Balance                                            Cognition Arousal/Alertness: Awake/alert Behavior During Therapy: WFL for tasks assessed/performed;Anxious Overall Cognitive Status: Within Functional Limits for tasks assessed                     Current Attention Level: Sustained         Problem Solving: Decreased initiation;Requires verbal cues General Comments: pt is more focused today  with better control of movement but fatigued.      Exercises Total Joint Exercises Ankle Circles/Pumps: AAROM;Both;5 reps General Exercises - Lower Extremity Quad Sets: AROM;Both;10 reps Gluteal Sets: AROM;Both;10 reps Heel Slides: AAROM;Both;10 reps Hip ABduction/ADduction: AAROM;Both;20 reps Straight Leg Raises: AAROM;Both;10 reps    General Comments        Pertinent Vitals/Pain Pain Assessment: Faces Faces Pain Scale: Hurts even more Pain Location: R hip and groin Pain Descriptors / Indicators: Grimacing Pain Intervention(s): Limited activity within patient's tolerance;Monitored during session;Premedicated before session;Repositioned    Home Living                      Prior Function            PT Goals (current goals can now be found in the care plan section) Progress towards PT goals: Progressing toward goals    Frequency    Min 2X/week      PT Plan Current plan remains appropriate    Co-evaluation              AM-PAC PT "6 Clicks"  Mobility   Outcome Measure  Help needed turning from your back to your side while in a flat bed without using bedrails?: A Lot Help needed moving from lying on your back to sitting on the side of a flat bed without using bedrails?: A Lot Help needed moving to and from a bed to a chair (including a wheelchair)?: A Lot Help needed standing up from a chair using your arms (e.g., wheelchair or bedside chair)?: Total Help needed to walk in hospital room?: Total Help needed climbing 3-5 steps with a railing? : Total 6 Click Score: 9    End of Session   Activity Tolerance: Patient limited by fatigue;Patient limited by pain Patient left: in bed;with call bell/phone within reach;with bed alarm set Nurse Communication: Mobility status PT Visit Diagnosis: Muscle weakness (generalized) (M62.81);Unsteadiness on feet (R26.81);Pain;Other abnormalities of gait and mobility (R26.89) Pain - Right/Left: Right Pain - part of  body: Leg     Time: [id number] PT Time Calculation (min) (ACUTE ONLY): 21 min  Charges:  $Therapeutic Exercise: 8-22 mins                    Ramond Dial 10/10/2019, 1:49 PM   Mee Hives, PT MS Acute Rehab Dept. Number: [place] and [place]

## 2019-10-11 ENCOUNTER — Encounter (HOSPITAL_COMMUNITY)
Admission: RE | Disposition: A | Discharge status: Hospice | Payer: Self-pay | Source: Home / Self Care | Attending: Vascular Surgery

## 2019-10-11 ENCOUNTER — Inpatient Hospital Stay (HOSPITAL_COMMUNITY): Payer: Medicare Other | Admitting: Certified Registered Nurse Anesthetist

## 2019-10-11 ENCOUNTER — Encounter (HOSPITAL_COMMUNITY): Payer: Self-pay | Admitting: Certified Registered Nurse Anesthetist

## 2019-10-11 HISTORY — PX: GROIN DEBRIDEMENT: SHX5159

## 2019-10-11 HISTORY — PX: APPLICATION OF WOUND VAC: SHX5189

## 2019-10-11 HISTORY — PX: TOE AMPUTATION: SHX809

## 2019-10-11 HISTORY — PX: AMPUTATION TOE: SHX6595

## 2019-10-11 LAB — GLUCOSE, CAPILLARY
Glucose-Capillary: 152 mg/dL — ABNORMAL HIGH (ref 70–99)
Glucose-Capillary: 153 mg/dL — ABNORMAL HIGH (ref 70–99)
Glucose-Capillary: 141 mg/dL — ABNORMAL HIGH (ref 70–99)
Glucose-Capillary: 187 mg/dL — ABNORMAL HIGH (ref 70–99)
Glucose-Capillary: 370 mg/dL — ABNORMAL HIGH (ref 70–99)

## 2019-10-11 SURGERY — DEBRIDEMENT, INGUINAL REGION
Anesthesia: General | Site: Toe | Laterality: Right

## 2019-10-11 MED ORDER — PHENYLEPHRINE 40 MCG/ML (10ML) SYRINGE FOR IV PUSH (FOR BLOOD PRESSURE SUPPORT)
PREFILLED_SYRINGE | INTRAVENOUS | Status: AC
  Filled 2019-10-11: qty 10

## 2019-10-11 MED ORDER — HYDROMORPHONE HCL 1 MG/ML IJ SOLN
INTRAMUSCULAR | Status: AC
  Filled 2019-10-11: qty 1

## 2019-10-11 MED ORDER — HYDROMORPHONE HCL 1 MG/ML IJ SOLN
0.2500 mg | INTRAMUSCULAR | Status: DC | PRN
  Administered 2019-10-11 (×3): 0.5 mg via INTRAVENOUS

## 2019-10-11 MED ORDER — MIDAZOLAM HCL 5 MG/5ML IJ SOLN
INTRAMUSCULAR | Status: DC | PRN
  Administered 2019-10-11: 2 mg via INTRAVENOUS

## 2019-10-11 MED ORDER — HYDROMORPHONE HCL 1 MG/ML IJ SOLN
INTRAMUSCULAR | Status: AC
  Administered 2019-10-11: 15:00:00
  Filled 2019-10-11: qty 1

## 2019-10-11 MED ORDER — LIDOCAINE 2% (20 MG/ML) 5 ML SYRINGE
INTRAMUSCULAR | Status: DC | PRN
  Administered 2019-10-11: 100 mg via INTRAVENOUS

## 2019-10-11 MED ORDER — SODIUM CHLORIDE 0.9 % IR SOLN
Status: DC | PRN
  Administered 2019-10-11: 3000 mL

## 2019-10-11 MED ORDER — PHENYLEPHRINE 40 MCG/ML (10ML) SYRINGE FOR IV PUSH (FOR BLOOD PRESSURE SUPPORT)
PREFILLED_SYRINGE | INTRAVENOUS | Status: DC | PRN
  Administered 2019-10-11 (×4): 80 ug via INTRAVENOUS

## 2019-10-11 MED ORDER — ONDANSETRON HCL 4 MG/2ML IJ SOLN
INTRAMUSCULAR | Status: AC
  Filled 2019-10-11: qty 2

## 2019-10-11 MED ORDER — LACTATED RINGERS IV SOLN
INTRAVENOUS | Status: DC
  Administered 2019-10-11: 12:00:00 via INTRAVENOUS

## 2019-10-11 MED ORDER — 0.9 % SODIUM CHLORIDE (POUR BTL) OPTIME
TOPICAL | Status: DC | PRN
  Administered 2019-10-11: 1000 mL

## 2019-10-11 MED ORDER — DEXAMETHASONE SODIUM PHOSPHATE 4 MG/ML IJ SOLN
INTRAMUSCULAR | Status: DC | PRN
  Administered 2019-10-11: 5 mg via INTRAVENOUS

## 2019-10-11 MED ORDER — LIDOCAINE 2% (20 MG/ML) 5 ML SYRINGE
INTRAMUSCULAR | Status: AC
  Filled 2019-10-11: qty 10

## 2019-10-11 MED ORDER — ONDANSETRON HCL 4 MG/2ML IJ SOLN
INTRAMUSCULAR | Status: DC | PRN
  Administered 2019-10-11: 4 mg via INTRAVENOUS

## 2019-10-11 MED ORDER — PROPOFOL 10 MG/ML IV BOLUS
INTRAVENOUS | Status: DC | PRN
  Administered 2019-10-11: 160 mg via INTRAVENOUS

## 2019-10-11 MED ORDER — FENTANYL CITRATE (PF) 100 MCG/2ML IJ SOLN
INTRAMUSCULAR | Status: DC | PRN
  Administered 2019-10-11: 50 ug via INTRAVENOUS
  Administered 2019-10-11 (×2): 25 ug via INTRAVENOUS

## 2019-10-11 MED ORDER — MIDAZOLAM HCL 2 MG/2ML IJ SOLN
INTRAMUSCULAR | Status: AC
  Filled 2019-10-11: qty 2

## 2019-10-11 MED ORDER — FENTANYL CITRATE (PF) 250 MCG/5ML IJ SOLN
INTRAMUSCULAR | Status: AC
  Filled 2019-10-11: qty 5

## 2019-10-11 SURGICAL SUPPLY — 40 items
BNDG ELASTIC 4X5.8 VLCR STR LF (GAUZE/BANDAGES/DRESSINGS) ×2 IMPLANT
BNDG GAUZE ELAST 4 BULKY (GAUZE/BANDAGES/DRESSINGS) ×2 IMPLANT
CANISTER SUCT 3000ML PPV (MISCELLANEOUS) ×8 IMPLANT
CANISTER WOUNDNEG PRESSURE 500 (CANNISTER) ×2 IMPLANT
COVER SURGICAL LIGHT HANDLE (MISCELLANEOUS) ×4 IMPLANT
COVER WAND RF STERILE (DRAPES) ×2 IMPLANT
DRSG EMULSION OIL 3X3 NADH (GAUZE/BANDAGES/DRESSINGS) ×2 IMPLANT
DRSG VAC ATS LRG SENSATRAC (GAUZE/BANDAGES/DRESSINGS) IMPLANT
DRSG VAC ATS MED SENSATRAC (GAUZE/BANDAGES/DRESSINGS) ×2 IMPLANT
DRSG VERSA FOAM LRG 10X15 (GAUZE/BANDAGES/DRESSINGS) ×2 IMPLANT
ELECT REM PT RETURN 9FT ADLT (ELECTROSURGICAL) ×4
ELECTRODE REM PT RTRN 9FT ADLT (ELECTROSURGICAL) ×2 IMPLANT
GAUZE SPONGE 4X4 12PLY STRL (GAUZE/BANDAGES/DRESSINGS) ×2 IMPLANT
GLOVE BIO SURGEON STRL SZ 6.5 (GLOVE) ×1 IMPLANT
GLOVE BIO SURGEON STRL SZ7.5 (GLOVE) ×4 IMPLANT
GLOVE BIO SURGEONS STRL SZ 6.5 (GLOVE) ×1
GLOVE BIOGEL PI IND STRL 6 (GLOVE) IMPLANT
GLOVE BIOGEL PI IND STRL 6.5 (GLOVE) IMPLANT
GLOVE BIOGEL PI IND STRL 7.0 (GLOVE) IMPLANT
GLOVE BIOGEL PI INDICATOR 6 (GLOVE) ×2
GLOVE BIOGEL PI INDICATOR 6.5 (GLOVE) ×4
GLOVE BIOGEL PI INDICATOR 7.0 (GLOVE) ×2
GOWN STRL REUS W/ TWL LRG LVL3 (GOWN DISPOSABLE) ×6 IMPLANT
GOWN STRL REUS W/ TWL XL LVL3 (GOWN DISPOSABLE) ×2 IMPLANT
GOWN STRL REUS W/TWL LRG LVL3 (GOWN DISPOSABLE) ×8
GOWN STRL REUS W/TWL XL LVL3 (GOWN DISPOSABLE) ×4
HANDPIECE INTERPULSE COAX TIP (DISPOSABLE) ×4
IV NS IRRIG 3000ML ARTHROMATIC (IV SOLUTION) ×2 IMPLANT
KIT BASIN OR (CUSTOM PROCEDURE TRAY) ×4 IMPLANT
KIT TURNOVER KIT B (KITS) ×4 IMPLANT
NS IRRIG 1000ML POUR BTL (IV SOLUTION) ×4 IMPLANT
PACK GENERAL/GYN (CUSTOM PROCEDURE TRAY) ×4 IMPLANT
PACK UNIVERSAL I (CUSTOM PROCEDURE TRAY) ×2 IMPLANT
PAD ARMBOARD 7.5X6 YLW CONV (MISCELLANEOUS) ×8 IMPLANT
PAD NEG PRESSURE SENSATRAC (MISCELLANEOUS) ×2 IMPLANT
SET HNDPC FAN SPRY TIP SCT (DISPOSABLE) IMPLANT
SPONGE LAP 18X18 X RAY DECT (DISPOSABLE) ×2 IMPLANT
SUT ETHILON 3 0 PS 1 (SUTURE) ×2 IMPLANT
TOWEL GREEN STERILE (TOWEL DISPOSABLE) ×6 IMPLANT
WATER STERILE IRR 1000ML POUR (IV SOLUTION) IMPLANT

## 2019-10-11 NOTE — Anesthesia Procedure Notes (Signed)
Procedure Name: LMA Insertion Date/Time: 10/11/2019 12:49 PM Performed by: Candis Shine, CRNA Pre-anesthesia Checklist: Patient identified, Emergency Drugs available, Suction available and Patient being monitored Patient Re-evaluated:Patient Re-evaluated prior to induction Oxygen Delivery Method: Circle System Utilized Preoxygenation: Pre-oxygenation with 100% oxygen Induction Type: IV induction LMA: LMA inserted LMA Size: 4.0 Number of attempts: 1 Placement Confirmation: positive ETCO2 Tube secured with: Tape Dental Injury: Teeth and Oropharynx as per pre-operative assessment

## 2019-10-11 NOTE — Progress Notes (Signed)
Methotrexate (Trexall; Rheumatrex) hold criteria  Hgb < 8 (Hgb 7.8 on 10/12)  WBC < 3  Pltc < 100K  SCr > 1.5x baseline (or > 2 if baseline unknown)  AST or ALT >3x ULN  Bili > 1.5x ULN  Ascites or pleural effusion  Diarrhea - Grade 2 or higher  Ulcerative stomatitis  Unexplained pneumonitis / hypoxemia  Active infection   Methotrexate has been placed on HOLD per P&T policy.  Callin Ashe S. Alford Highland, PharmD, [facility] [place] Clinical Staff Pharmacist [phone]

## 2019-10-11 NOTE — Op Note (Signed)
    Patient name: Lauren [name] MRN: [id number] DOB: 1951-10-23 Sex: female  10/11/2019 Pre-operative Diagnosis: right groin surgical wound necrosis and right great toe gangrene Post-operative diagnosis:  Same Surgeon:  Erlene Quan C. Donzetta Matters, MD Assistant: Ellsworth Lennox, RN Procedure Performed: 1.  Sharp, excisional debridement of right groin to 17 x 5 x 3 cm 2.  Right great toe amputation  Indications: 68 year old female recently underwent right common femoral endarterectomy with vein patch angioplasty.  This was done for critical right lower extremity ischemia.  She now has progressive gangrenous changes to the right great toe.  She is also had wound necrosis in the right groin.  She is indicated for debridement as well as great toe amputation.  Findings: Superficial wound was all necrotic did not appear grossly infected.  This was debrided back to healthy-appearing tissue 17 x 5 x 3 cm in size.  Great toe amputation site had adequate bleeding to suggest healing is a possibility.   Procedure:  The patient was identified in the holding area and taken to the operating room where she is placed supine operative table general anesthesia induced.  She was sterilely prepped and draped the right groin right lower extremity and foot in usual fashion antibiotics were minister and timeout was called.  We began with scissor sharp excisional debridement of all the necrotic tissue in the right groin to 17 x 5 x 3 cm.  We then performed pulse evacuation with 3 L.  We obtain hemostasis with cautery.  We placed a white sponge overlying the artery although I could not fully visualize this.  We then placed the black sponge and fashion a wound VAC in place in place at the suction.  We then turned our attention to the right foot.  Tennis racquet type incision was made around the great toe.  We dissected back to the metatarsophalangeal joint and remove the toe en bloc.  Rongeur was used to debride the metatarsal capsule.  We  then smoothed with rasp.  We irrigated the wound obtain hemostasis.  We closed the skin with interrupted 3-0 nylon suture.  She was then away from anesthesia having tolerated procedure without immediate complication.  EBL: 50 cc   Kincade Granberg C. Donzetta Matters, MD [facility] [place] Office: [phone] Pager: [phone]

## 2019-10-11 NOTE — Plan of Care (Signed)
Continue to monitor

## 2019-10-11 NOTE — Progress Notes (Addendum)
Report called to short stay. Patient to OR at this time. Patient will need IV. Attempted twice

## 2019-10-11 NOTE — Care Management Important Message (Signed)
Important Message  Patient Details  Name: Lauren [name] MRN: [id number] Date of Birth: [birth year]   Medicare Important Message Given:  Yes     [name] 10/11/2019, 12:54 PM

## 2019-10-11 NOTE — Transfer of Care (Signed)
Immediate Anesthesia Transfer of Care Note  Patient: LAMIS MCCULLY  Procedure(s) Performed: [name] DEBRIDEMENT RIGHT GROIN (Right Groin) APPLICATION OF WOUND VAC TO RIGHT GROIN (Right Groin) Amputation of Right Great Toe (Right Toe)  Patient Location: PACU  Anesthesia Type:General  Level of Consciousness: awake, alert  and oriented  Airway & Oxygen Therapy: Patient Spontanous Breathing and Patient connected to face mask oxygen  Post-op Assessment: Report given to RN and Post -op Vital signs reviewed and stable  Post vital signs: Reviewed and stable  Last Vitals:  Vitals Value Taken Time  BP 141/70 10/11/19 1401  Temp    Pulse 82 10/11/19 1403  Resp 12 10/11/19 1403  SpO2 95 % 10/11/19 1403  Vitals shown include unvalidated device data.  Last Pain:  Vitals:   10/11/19 0830  TempSrc:   PainSc: 6       Patients Stated Pain Goal: 2 ([id number])  Complications: No apparent anesthesia complications

## 2019-10-11 NOTE — Progress Notes (Signed)
Patient back to room at this time from PACU. Telemetry applied. V/s and assessment complete. Husband at bedside. Will continue to monitor.   Emelda Fear, RN

## 2019-10-11 NOTE — Anesthesia Preprocedure Evaluation (Addendum)
Anesthesia Evaluation  Patient identified by MRN, date of birth, ID band Patient awake    Reviewed: Allergy & Precautions, H&P , NPO status , Patient's Chart, lab work & pertinent test results  Airway Mallampati: II  TM Distance: >3 FB Neck ROM: Full    Dental  (+) Dental Advisory Given   Pulmonary pneumonia, COPD, former smoker,    Pulmonary exam normal breath sounds clear to auscultation       Cardiovascular hypertension, Pt. on medications and Pt. on home beta blockers + angina + CAD, + Peripheral Vascular Disease and + DOE  Normal cardiovascular exam Rhythm:Regular Rate:Normal  Ech 2018 - Left ventricle: The cavity size was normal. Systolic function was normal. The estimated ejection fraction was in the range of 55% to 60%. Wall motion was normal; there were no regional wall motion abnormalities. Features are consistent with a pseudonormal left ventricular filling pattern, with concomitant abnormal relaxation and increased filling pressure (grade 2 diastolic dysfunction). - Mitral valve: There was trivial regurgitation.  Impressions:- Normal LVF with grade 2 DD and trivial MR. Compared to prior echo, PASP is nor normal.   Neuro/Psych PSYCHIATRIC DISORDERS Anxiety Depression TIACVA    GI/Hepatic Neg liver ROS, GERD  ,  Endo/Other  diabetes  Renal/GU Renal disease  negative genitourinary   Musculoskeletal  (+) Arthritis ,   Abdominal   Peds  Hematology  (+) Blood dyscrasia, anemia ,   Anesthesia Other Findings   Reproductive/Obstetrics negative OB ROS                          Anesthesia Physical Anesthesia Plan  ASA: III  Anesthesia Plan: General   Post-op Pain Management:    Induction: Intravenous  PONV Risk Score and Plan: 4 or greater and Ondansetron, Dexamethasone and Treatment may vary due to age or medical condition  Airway Management Planned: LMA  Additional Equipment:  None  Intra-op Plan:   Post-operative Plan: Extubation in OR  Informed Consent: I have reviewed the patients History and Physical, chart, labs and discussed the procedure including the risks, benefits and alternatives for the proposed anesthesia with the patient or authorized representative who has indicated his/her understanding and acceptance.     Dental advisory given  Plan Discussed with: CRNA  Anesthesia Plan Comments:        Anesthesia Quick Evaluation

## 2019-10-11 NOTE — Progress Notes (Signed)
  Progress Note    10/11/2019 8:54 AM 12 Days Post-Op  Subjective: no overnight issues  Vitals:   10/11/19 0406 10/11/19 0822  BP: 108/66 (!) 103/91  Pulse: 73   Resp: 17 18  Temp: 97.6 F (36.4 C) 98.4 F (36.9 C)  SpO2: 93%     Physical Exam: aaox3 Non labored respirations Right groin dressing in place Right foot is warm, great toe is dusky  CBC    Component Value Date/Time   WBC 14.6 (H) 10/10/2019 0437   RBC 2.87 (L) 10/10/2019 0437   HGB 7.8 (L) 10/10/2019 0437   HGB 11.2 01/28/2018 1035   HGB 12.0 09/29/2011 1016   HCT 25.6 (L) 10/10/2019 0437   HCT 34.4 01/28/2018 1035   HCT 35.3 09/29/2011 1016   PLT 535 (H) 10/10/2019 0437   PLT 384 (H) 01/28/2018 1035   MCV 89.2 10/10/2019 0437   MCV 88 01/28/2018 1035   MCV 88.0 09/29/2011 1016   MCH 27.2 10/10/2019 0437   MCHC 30.5 10/10/2019 0437   RDW 17.7 (H) 10/10/2019 0437   RDW 17.6 (H) 01/28/2018 1035   RDW 14.8 (H) 09/29/2011 1016   LYMPHSABS 1.1 09/11/2017 2011   LYMPHSABS 0.6 (L) 09/29/2011 1016   MONOABS 1.5 (H) 09/11/2017 2011   MONOABS 0.8 09/29/2011 1016   EOSABS 0.2 09/11/2017 2011   EOSABS 0.3 09/29/2011 1016   BASOSABS 0.0 09/11/2017 2011   BASOSABS 0.1 09/29/2011 1016    BMET    Component Value Date/Time   NA 133 (L) 10/10/2019 0437   NA 142 01/28/2018 1035   K 3.7 10/10/2019 0437   CL 95 (L) 10/10/2019 0437   CO2 26 10/10/2019 0437   GLUCOSE 185 (H) 10/10/2019 0437   BUN 23 10/10/2019 0437   BUN 19 01/28/2018 1035   BUN 14.7 07/18/2014 1142   CREATININE 1.34 (H) 10/10/2019 0437   CREATININE 1.0 07/18/2014 1142   CALCIUM 8.6 (L) 10/10/2019 0437   GFRNONAA 40 (L) 10/10/2019 0437   GFRAA 47 (L) 10/10/2019 0437    INR    Component Value Date/Time   INR 1.2 09/29/2019 0628     Intake/Output Summary (Last 24 hours) at 10/11/2019 0854 Last data filed at 10/11/2019 0604 Gross per 24 hour  Intake 724.55 ml  Output 1000 ml  Net -275.45 ml     Assessment/plan:  68 y.o.  female is s/p R CFEA with wound breakdown in groin. Right great toe does not appear salvageable. Plan for OR today with R groin debridement and R great toe amputation    Baylie Drakes C. Donzetta Matters, MD [facility] [place] Office: [phone] Pager: [phone]  10/11/2019 8:54 AM

## 2019-10-12 ENCOUNTER — Encounter (HOSPITAL_COMMUNITY): Payer: Self-pay | Admitting: General Practice

## 2019-10-12 LAB — BASIC METABOLIC PANEL
Anion gap: 12 (ref 5–15)
BUN: 20 mg/dL (ref 8–23)
CO2: 27 mmol/L (ref 22–32)
Calcium: 8.5 mg/dL — ABNORMAL LOW (ref 8.9–10.3)
Chloride: 96 mmol/L — ABNORMAL LOW (ref 98–111)
Creatinine, Ser: 1.15 mg/dL — ABNORMAL HIGH (ref 0.44–1.00)
GFR calc Af Amer: 56 mL/min — ABNORMAL LOW (ref >60)
GFR calc non Af Amer: 49 mL/min — ABNORMAL LOW (ref >60)
Glucose, Bld: 338 mg/dL — ABNORMAL HIGH (ref 70–99)
Potassium: 4 mmol/L (ref 3.5–5.1)
Sodium: 135 mmol/L (ref 135–145)

## 2019-10-12 LAB — CBC WITH DIFFERENTIAL/PLATELET
Abs Immature Granulocytes: 0.16 10*3/uL — ABNORMAL HIGH (ref 0.00–0.07)
Basophils Absolute: 0 10*3/uL (ref 0.0–0.1)
Basophils Relative: 0 %
Eosinophils Absolute: 0 10*3/uL (ref 0.0–0.5)
Eosinophils Relative: 0 %
HCT: 24.4 % — ABNORMAL LOW (ref 36.0–46.0)
Hemoglobin: 7.5 g/dL — ABNORMAL LOW (ref 12.0–15.0)
Immature Granulocytes: 1 %
Lymphocytes Relative: 5 %
Lymphs Abs: 0.7 10*3/uL (ref 0.7–4.0)
MCH: 27 pg (ref 26.0–34.0)
MCHC: 30.7 g/dL (ref 30.0–36.0)
MCV: 87.8 fL (ref 80.0–100.0)
Monocytes Absolute: 1.1 10*3/uL — ABNORMAL HIGH (ref 0.1–1.0)
Monocytes Relative: 7 %
Neutro Abs: 14.3 10*3/uL — ABNORMAL HIGH (ref 1.7–7.7)
Neutrophils Relative %: 87 %
Platelets: 559 10*3/uL — ABNORMAL HIGH (ref 150–400)
RBC: 2.78 MIL/uL — ABNORMAL LOW (ref 3.87–5.11)
RDW: 17.8 % — ABNORMAL HIGH (ref 11.5–15.5)
WBC: 16.3 10*3/uL — ABNORMAL HIGH (ref 4.0–10.5)
nRBC: 0 % (ref 0.0–0.2)

## 2019-10-12 LAB — GLUCOSE, CAPILLARY
Glucose-Capillary: 289 mg/dL — ABNORMAL HIGH (ref 70–99)
Glucose-Capillary: 284 mg/dL — ABNORMAL HIGH (ref 70–99)
Glucose-Capillary: 220 mg/dL — ABNORMAL HIGH (ref 70–99)
Glucose-Capillary: 184 mg/dL — ABNORMAL HIGH (ref 70–99)

## 2019-10-12 NOTE — Progress Notes (Addendum)
[facility] [place]  Subjective  - No new complaints.   Objective (!) 142/51 74 97.8 F (36.6 C) (Oral) 11 92%  Intake/Output Summary (Last 24 hours) at 10/12/2019 0733 Last data filed at 10/12/2019 0400 Gross per 24 hour  Intake 572.83 ml  Output 4400 ml  Net -3827.17 ml    Doppler signal left PT Left foot dressing clean and dry Left groin wound vac 100cc OP Lungs non labored   Assessment/Planning: Femoral enterectomy 09/29/2019 Groin debridement with wound vac placement and GT amputation We will change the dressings tomorrow    Lauren [name] 10/12/2019 7:33 AM --  Laboratory Lab Results: Recent Labs    10/10/19 0437 10/12/19 0421  WBC 14.6* 16.3*  HGB 7.8* 7.5*  HCT 25.6* 24.4*  PLT 535* 559*   BMET Recent Labs    10/10/19 0437 10/12/19 0421  NA 133* 135  K 3.7 4.0  CL 95* 96*  CO2 26 27  GLUCOSE 185* 338*  BUN 23 20  CREATININE 1.34* 1.15*  CALCIUM 8.6* 8.5*    COAG Lab Results  Component Value Date   INR 1.2 09/29/2019   INR 1.1 09/15/2019   INR 1.14 08/22/2017   No results found for: PTT   I have independently interviewed and examined patient and agree with PA assessment and plan above. Plan to return to OR tomorrow for wound vac change.  Terrea Bruster C. Donzetta Matters, MD [facility] [place] Office: [phone] Pager: [phone]

## 2019-10-12 NOTE — Plan of Care (Signed)
Continue to monitor

## 2019-10-12 NOTE — Progress Notes (Signed)
Inpatient Diabetes Program Recommendations  AACE/ADA: New Consensus Statement on Inpatient Glycemic Control (2015)  Target Ranges:  Prepandial:   less than 140 mg/dL      Peak postprandial:   less than 180 mg/dL (1-2 hours)      Critically ill patients:  140 - 180 mg/dL   Lab Results  Component Value Date   GLUCAP 289 (H) 10/12/2019   HGBA1C 9.4 (H) 09/13/2019    Review of Glycemic Control  Discussed HgbA1C of 9.4% with pt and stressed importance of good glycemic control to prevent long and short-term complications.  Pt states she is going to new endocrinologist in November, although cannot recall name.  Checks blood sugars 1-2x/day and states it's usually "high." Admits to eating large quantities of CHOs and very little exercise. States she knows what to do, just needs to motivate herself.   Inpatient Diabetes Program Recommendations:     Add Novolog 4 units tidwc for meal coverage insulin.  Will continue to follow.  Thank you. Lorenda Peck, RD, LDN, CDE Inpatient Diabetes Coordinator [phone]

## 2019-10-12 NOTE — Anesthesia Preprocedure Evaluation (Addendum)
Anesthesia Evaluation  Patient identified by MRN, date of birth, ID band Patient awake    Reviewed: Allergy & Precautions, H&P , NPO status , Patient's Chart, lab work & pertinent test results, reviewed documented beta blocker date and time   Airway Mallampati: II  TM Distance: >3 FB Neck ROM: Full    Dental  (+) Dental Advisory Given, Partial Lower   Pulmonary COPD, former smoker,    Pulmonary exam normal        Cardiovascular hypertension, Pt. on medications and Pt. on home beta blockers + angina + CAD, + Peripheral Vascular Disease, +CHF (diastolic) and + DOE  Normal cardiovascular exam  Ech 2018 - Left ventricle: The cavity size was normal. Systolic function was normal. The estimated ejection fraction was in the range of 55% to 60%. Wall motion was normal; there were no regional wall motion abnormalities. Features are consistent with a pseudonormal left ventricular filling pattern, with concomitant abnormal relaxation and increased filling pressure (grade 2 diastolic dysfunction). - Mitral valve: There was trivial regurgitation.  Impressions:- Normal LVF with grade 2 DD and trivial MR. Compared to prior echo, PASP is nor normal.   Neuro/Psych PSYCHIATRIC DISORDERS Anxiety Depression TIACVA    GI/Hepatic Neg liver ROS, GERD  ,  Endo/Other  diabetes, Type 2, Insulin Dependent Obesity   Renal/GU Renal InsufficiencyRenal disease     Musculoskeletal  (+) Arthritis ,   Abdominal   Peds  Hematology  (+) anemia ,   Anesthesia Other Findings   Reproductive/Obstetrics                            Anesthesia Physical  Anesthesia Plan  ASA: III  Anesthesia Plan: General   Post-op Pain Management:    Induction: Intravenous  PONV Risk Score and Plan: 4 or greater and Ondansetron, Dexamethasone and Treatment may vary due to age or medical condition  Airway Management Planned: LMA  Additional  Equipment: None  Intra-op Plan:   Post-operative Plan: Extubation in OR  Informed Consent: I have reviewed the patients History and Physical, chart, labs and discussed the procedure including the risks, benefits and alternatives for the proposed anesthesia with the patient or authorized representative who has indicated his/her understanding and acceptance.     Dental advisory given  Plan Discussed with: CRNA and Anesthesiologist  Anesthesia Plan Comments:        Anesthesia Quick Evaluation

## 2019-10-12 NOTE — Consult Note (Signed)
[place] Nurse wound follow up Groin debridement with wound vac placement and GT amputation 10/11/19.  Surgery will take back to OR to change the dressings tomorrow.  No WOC needs at this time.   Will not follow at this time.  Please re-consult if needed.  Domenic Moras MSN, RN, FNP-BC CWON Wound, Ostomy, Continence Nurse Pager [phone]

## 2019-10-12 NOTE — Progress Notes (Signed)
Pharmacy Antibiotic Note  Lauren [name] is a 68 y.o. female admitted on 09/29/2019 with R great toe gangrene and wound necrosis in the right groin.  Pharmacy has been consulted for Vancomycin dosing.  ID: Vanc/zosyn for R great toe gangrene and wound necrosis in the right groin No cultures WBC 14.6>>16.3 up. Afebrile. Scr 1.15 back down. -10/13: I&D and VAC placement of R groin (MD notes Superficial wound was all necrotic did not appear grossly infected)  and R great toe amputation  Chronic Macrobid 10/1 >> 10/11 Ciprofloxacin 10/9 >> 10/11 Vancomycin 10/11 >> Zosyn 10/11 >>  Vancomycin 2000 mg IV once, then 1000 mg IV Q 24 hrs.  (est AUC: 511, SCr 1.16, Vd 0.5)  Plan:  Vancomycin 1000 mg IV Q 24 hrs.  Zosyn 3.375g IV q8h (4 hour infusion) per MD D/c Abx soon?     Height: 5' 7.5" (171.5 cm) Weight: 205 lb 4 oz (93.1 kg) IBW/kg (Calculated) : 62.75  Temp (24hrs), Avg:98 F (36.7 C), Min:97.8 F (36.6 C), Max:98.3 F (36.8 C)  Recent Labs  Lab 10/06/19 0303 10/08/19 0303 10/10/19 0437 10/12/19 0421  WBC 14.3* 12.7* 14.6* 16.3*  CREATININE 1.16*  --  1.34* 1.15*    Estimated Creatinine Clearance: 54.6 mL/min (A) (by C-G formula based on SCr of 1.15 mg/dL (H)).    Allergies  Allergen Reactions  . Ambien [Zolpidem Tartrate] Other (See Comments)    Per the pt, "it makes me go crazy"  . Sulfa Antibiotics Rash  . Ativan [Lorazepam] Other (See Comments)    Patient has the opposite effect when taking medication   . Elavil [Amitriptyline Hcl] Other (See Comments)    Gastritis  . Prozac [Fluoxetine Hcl] Anxiety and Other (See Comments)    irritability     Krystian Younglove S. Alford Highland, PharmD, BCPS Clinical Staff Pharmacist Eilene Ghazi [place] 10/12/2019 10:36 AM

## 2019-10-13 ENCOUNTER — Inpatient Hospital Stay (HOSPITAL_COMMUNITY): Payer: Medicare Other | Admitting: Anesthesiology

## 2019-10-13 ENCOUNTER — Encounter (HOSPITAL_COMMUNITY)
Admission: RE | Disposition: A | Discharge status: Hospice | Payer: Self-pay | Source: Home / Self Care | Attending: Vascular Surgery

## 2019-10-13 ENCOUNTER — Encounter (HOSPITAL_COMMUNITY): Payer: Self-pay | Admitting: Orthopedic Surgery

## 2019-10-13 HISTORY — PX: APPLICATION OF WOUND VAC: SHX5189

## 2019-10-13 HISTORY — PX: MUSCLE FLAP CLOSURE: SHX2054

## 2019-10-13 HISTORY — PX: GROIN DEBRIDEMENT: SHX5159

## 2019-10-13 LAB — GLUCOSE, CAPILLARY
Glucose-Capillary: 102 mg/dL — ABNORMAL HIGH (ref 70–99)
Glucose-Capillary: 100 mg/dL — ABNORMAL HIGH (ref 70–99)
Glucose-Capillary: 137 mg/dL — ABNORMAL HIGH (ref 70–99)
Glucose-Capillary: 195 mg/dL — ABNORMAL HIGH (ref 70–99)
Glucose-Capillary: 226 mg/dL — ABNORMAL HIGH (ref 70–99)
Glucose-Capillary: 261 mg/dL — ABNORMAL HIGH (ref 70–99)

## 2019-10-13 LAB — HEPARIN LEVEL (UNFRACTIONATED): Heparin Unfractionated: 1.06 IU/mL — ABNORMAL HIGH (ref 0.30–0.70)

## 2019-10-13 LAB — APTT: aPTT: 38 seconds — ABNORMAL HIGH (ref 24–36)

## 2019-10-13 IMAGING — DX DG ABDOMEN 1V
1 series · 1 of 1 positions shown · non-contrast
Comparison: 0066 hours today.

CLINICAL DATA: 66-year-old female status post NG tube placement.

EXAM:
ABDOMEN - 1 VIEW

[abdomen kub]
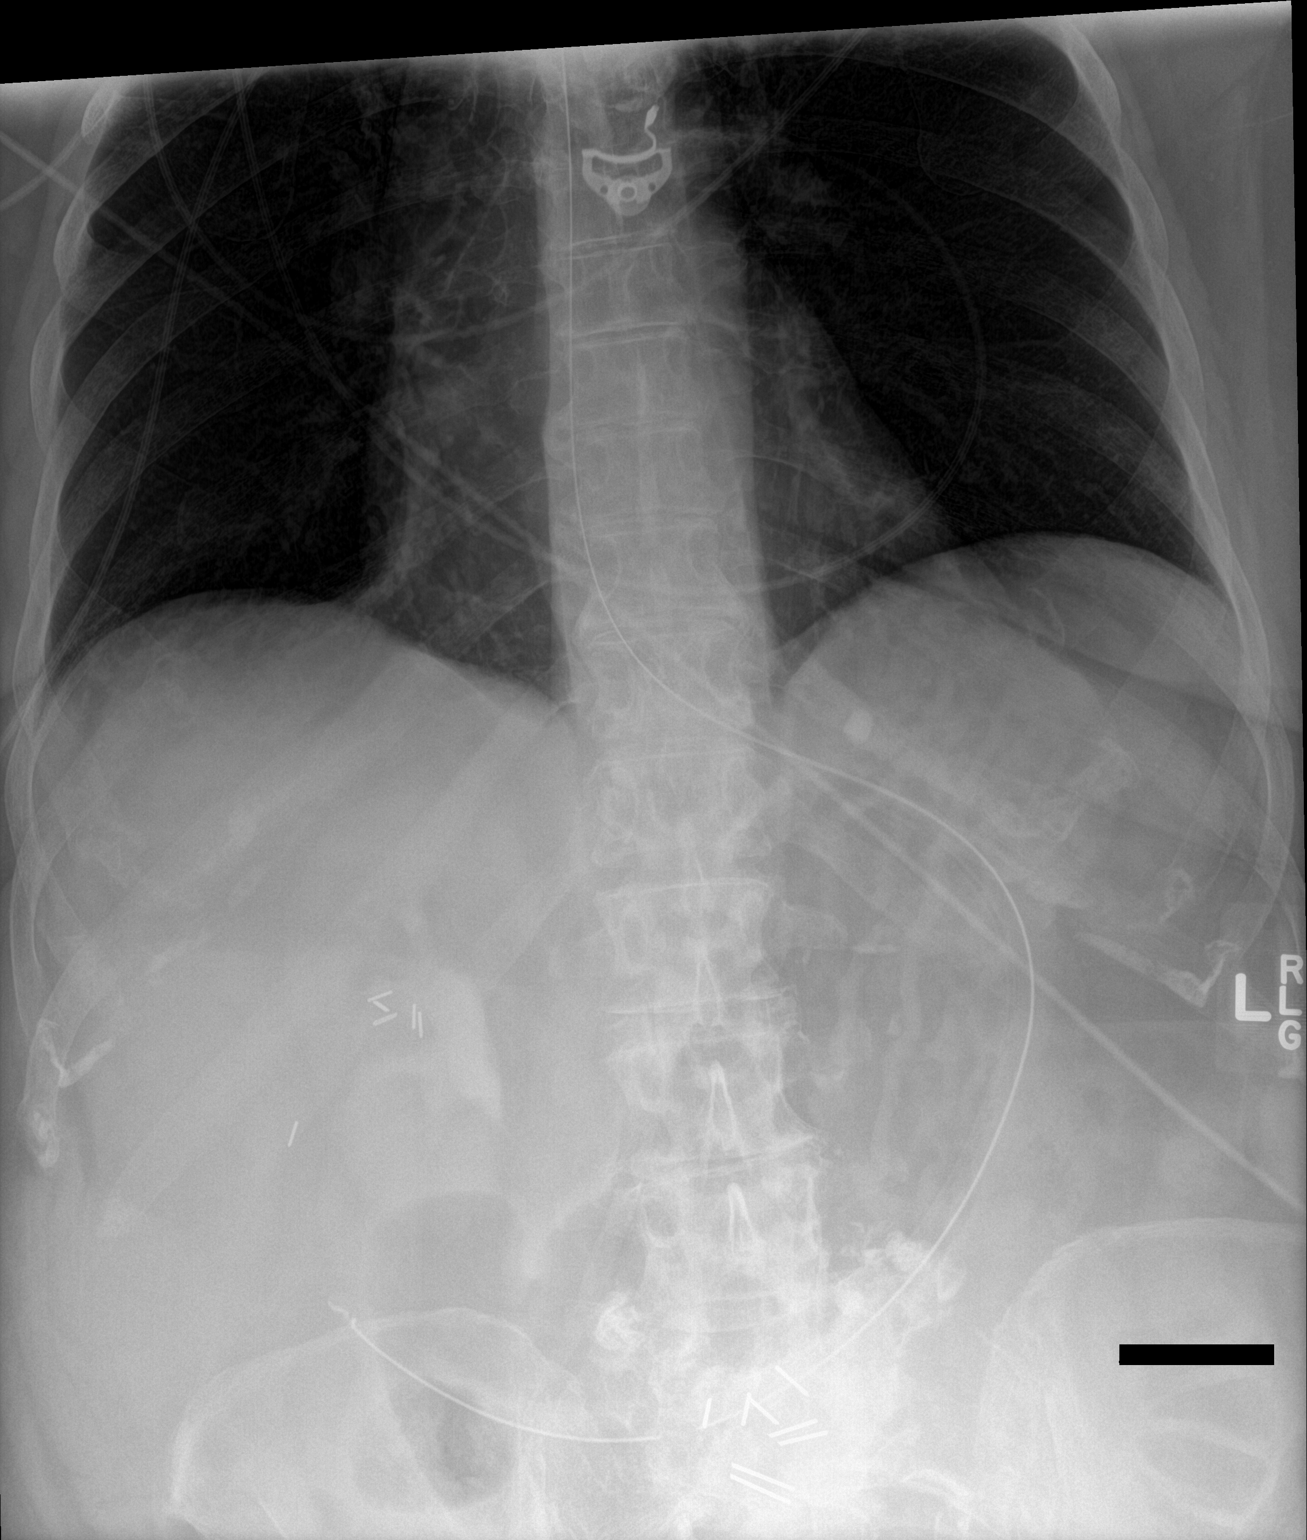

[1 of 1 positions shown; findings below may reference images not displayed]

FINDINGS: Portable AP view at 0684 hours. The enteric tube has been advanced
and the side hole now projects at the level of the distal gastric
body. The tip is at the level of the antrum. Mild gas distended
stomach.

Negative lung bases. Stable cholecystectomy clips. Excreted IV
contrast in nondilated right renal collecting system. Possible small
volume of retained oral contrast in the lower abdominal small bowel
or sigmoid colon.
IMPRESSION: Enteric tube tip now at the gastric antrum, side hole at the distal
body.

## 2019-10-13 SURGERY — DEBRIDEMENT, INGUINAL REGION
Anesthesia: General | Site: Groin | Laterality: Right

## 2019-10-13 MED ORDER — FENTANYL CITRATE (PF) 100 MCG/2ML IJ SOLN
INTRAMUSCULAR | Status: AC
  Filled 2019-10-13: qty 2

## 2019-10-13 MED ORDER — FENTANYL CITRATE (PF) 100 MCG/2ML IJ SOLN
INTRAMUSCULAR | Status: AC
  Administered 2019-10-13: 50 ug via INTRAVENOUS
  Filled 2019-10-13: qty 2

## 2019-10-13 MED ORDER — OXYCODONE HCL 5 MG/5ML PO SOLN: 5.0000 mg | Freq: Once | ORAL | Status: AC | PRN

## 2019-10-13 MED ORDER — LIDOCAINE 2% (20 MG/ML) 5 ML SYRINGE
INTRAMUSCULAR | Status: AC
  Filled 2019-10-13: qty 5

## 2019-10-13 MED ORDER — ONDANSETRON HCL 4 MG/2ML IJ SOLN
INTRAMUSCULAR | Status: AC
  Filled 2019-10-13: qty 2

## 2019-10-13 MED ORDER — LACTATED RINGERS IV SOLN
INTRAVENOUS | Status: DC | PRN
  Administered 2019-10-13: 09:00:00 via INTRAVENOUS

## 2019-10-13 MED ORDER — ENSURE PO LIQD: 237.0000 mL | Freq: Three times a day (TID) | ORAL | Status: DC

## 2019-10-13 MED ORDER — DEXAMETHASONE SODIUM PHOSPHATE 4 MG/ML IJ SOLN
INTRAMUSCULAR | Status: DC | PRN
  Administered 2019-10-13: 4 mg via INTRAVENOUS

## 2019-10-13 MED ORDER — DEXAMETHASONE SODIUM PHOSPHATE 10 MG/ML IJ SOLN
INTRAMUSCULAR | Status: AC
  Filled 2019-10-13: qty 1

## 2019-10-13 MED ORDER — ENSURE ENLIVE PO LIQD
237.0000 mL | Freq: Three times a day (TID) | ORAL | Status: DC
  Administered 2019-10-15: 11:00:00 237 mL via ORAL

## 2019-10-13 MED ORDER — FENTANYL CITRATE (PF) 250 MCG/5ML IJ SOLN
INTRAMUSCULAR | Status: AC
  Filled 2019-10-13: qty 5

## 2019-10-13 MED ORDER — ONDANSETRON HCL 4 MG/2ML IJ SOLN
INTRAMUSCULAR | Status: DC | PRN
  Administered 2019-10-13: 4 mg via INTRAVENOUS

## 2019-10-13 MED ORDER — HEPARIN (PORCINE) 25000 UT/250ML-% IV SOLN
1250.0000 [IU]/h | INTRAVENOUS | Status: DC
  Administered 2019-10-13: 1150 [IU]/h via INTRAVENOUS
  Administered 2019-10-14: 09:00:00 1500 [IU]/h via INTRAVENOUS
  Administered 2019-10-15: 1400 [IU]/h via INTRAVENOUS
  Administered 2019-10-15: 1500 [IU]/h via INTRAVENOUS
  Administered 2019-10-16: 11:00:00 1400 [IU]/h via INTRAVENOUS
  Administered 2019-10-17: 03:00:00 1450 [IU]/h via INTRAVENOUS
  Administered 2019-10-17: 1400 [IU]/h via INTRAVENOUS
  Administered 2019-10-19 – 2019-10-23 (×6): 1300 [IU]/h via INTRAVENOUS
  Administered 2019-10-25: 1000 [IU]/h via INTRAVENOUS
  Administered 2019-10-26 – 2019-11-03 (×10): 1150 [IU]/h via INTRAVENOUS
  Administered 2019-11-04: 23:00:00 1250 [IU]/h via INTRAVENOUS
  Administered 2019-11-04: 1150 [IU]/h via INTRAVENOUS
  Administered 2019-11-05 – 2019-11-07 (×3): 1250 [IU]/h via INTRAVENOUS
  Administered 2019-11-08 – 2019-11-11 (×4): 1100 [IU]/h via INTRAVENOUS
  Administered 2019-11-12 – 2019-11-17 (×6): 1250 [IU]/h via INTRAVENOUS
  Filled 2019-10-13 (×42): qty 250

## 2019-10-13 MED ORDER — PROPOFOL 10 MG/ML IV BOLUS
INTRAVENOUS | Status: DC | PRN
  Administered 2019-10-13: 160 mg via INTRAVENOUS

## 2019-10-13 MED ORDER — ACETAMINOPHEN 10 MG/ML IV SOLN
1000.0000 mg | Freq: Once | INTRAVENOUS | Status: DC | PRN
  Administered 2019-10-13: 11:00:00 1000 mg via INTRAVENOUS

## 2019-10-13 MED ORDER — PROPOFOL 10 MG/ML IV BOLUS
INTRAVENOUS | Status: AC
  Filled 2019-10-13: qty 20

## 2019-10-13 MED ORDER — OXYCODONE HCL 5 MG PO TABS
ORAL_TABLET | ORAL | Status: AC
  Administered 2019-10-13: 11:00:00 5 mg via ORAL
  Filled 2019-10-13: qty 1

## 2019-10-13 MED ORDER — HYDROMORPHONE HCL 1 MG/ML IJ SOLN
0.2500 mg | INTRAMUSCULAR | Status: DC | PRN
  Administered 2019-10-13 (×2): 0.5 mg via INTRAVENOUS

## 2019-10-13 MED ORDER — PHENYLEPHRINE HCL (PRESSORS) 10 MG/ML IV SOLN
INTRAVENOUS | Status: DC | PRN
  Administered 2019-10-13 (×2): 80 ug via INTRAVENOUS

## 2019-10-13 MED ORDER — GLUCERNA SHAKE PO LIQD
237.0000 mL | Freq: Three times a day (TID) | ORAL | Status: DC
  Administered 2019-10-15: 11:00:00 237 mL via ORAL

## 2019-10-13 MED ORDER — HYDROMORPHONE HCL 1 MG/ML IJ SOLN
INTRAMUSCULAR | Status: AC
  Administered 2019-10-13: 0.5 mg via INTRAVENOUS
  Filled 2019-10-13: qty 1

## 2019-10-13 MED ORDER — SODIUM CHLORIDE 0.9 % IR SOLN
Status: DC | PRN
  Administered 2019-10-13: 3000 mL

## 2019-10-13 MED ORDER — MIDAZOLAM HCL 2 MG/2ML IJ SOLN
INTRAMUSCULAR | Status: AC
  Filled 2019-10-13: qty 2

## 2019-10-13 MED ORDER — TAB-A-VITE/IRON PO TABS
1.0000 | ORAL_TABLET | Freq: Every day | ORAL | Status: DC
  Administered 2019-10-14 – 2019-10-25 (×8): 1 via ORAL
  Filled 2019-10-13 (×14): qty 1

## 2019-10-13 MED ORDER — FENTANYL CITRATE (PF) 100 MCG/2ML IJ SOLN
INTRAMUSCULAR | Status: DC | PRN
  Administered 2019-10-13 (×4): 50 ug via INTRAVENOUS

## 2019-10-13 MED ORDER — MIDAZOLAM HCL 5 MG/5ML IJ SOLN
INTRAMUSCULAR | Status: DC | PRN
  Administered 2019-10-13: 2 mg via INTRAVENOUS

## 2019-10-13 MED ORDER — FENTANYL CITRATE (PF) 100 MCG/2ML IJ SOLN
25.0000 ug | INTRAMUSCULAR | Status: DC | PRN
  Administered 2019-10-13 (×3): 50 ug via INTRAVENOUS

## 2019-10-13 MED ORDER — ONDANSETRON HCL 4 MG/2ML IJ SOLN: 4.0000 mg | Freq: Once | INTRAMUSCULAR | Status: DC | PRN

## 2019-10-13 MED ORDER — EPHEDRINE SULFATE-NACL 50-0.9 MG/10ML-% IV SOSY
PREFILLED_SYRINGE | INTRAVENOUS | Status: DC | PRN
  Administered 2019-10-13 (×2): 5 mg via INTRAVENOUS
  Administered 2019-10-13: 10 mg via INTRAVENOUS
  Administered 2019-10-13: 5 mg via INTRAVENOUS

## 2019-10-13 MED ORDER — PROMETHAZINE HCL 25 MG/ML IJ SOLN
12.5000 mg | Freq: Once | INTRAMUSCULAR | Status: AC
  Administered 2019-10-13: 12.5 mg via INTRAVENOUS
  Filled 2019-10-13: qty 1

## 2019-10-13 MED ORDER — 0.9 % SODIUM CHLORIDE (POUR BTL) OPTIME
TOPICAL | Status: DC | PRN
  Administered 2019-10-13: 1000 mL

## 2019-10-13 MED ORDER — ACETAMINOPHEN 10 MG/ML IV SOLN
INTRAVENOUS | Status: AC
  Administered 2019-10-13: 1000 mg via INTRAVENOUS
  Filled 2019-10-13: qty 100

## 2019-10-13 MED ORDER — OXYCODONE HCL 5 MG PO TABS
5.0000 mg | ORAL_TABLET | Freq: Once | ORAL | Status: AC | PRN
  Administered 2019-10-13: 11:00:00 5 mg via ORAL

## 2019-10-13 MED ORDER — LIDOCAINE 2% (20 MG/ML) 5 ML SYRINGE
INTRAMUSCULAR | Status: DC | PRN
  Administered 2019-10-13: 80 mg via INTRAVENOUS

## 2019-10-13 SURGICAL SUPPLY — 30 items
CANISTER SUCT 3000ML PPV (MISCELLANEOUS) ×4 IMPLANT
CANISTER WOUND CARE 500ML ATS (WOUND CARE) ×2 IMPLANT
COVER SURGICAL LIGHT HANDLE (MISCELLANEOUS) ×3 IMPLANT
COVER WAND RF STERILE (DRAPES) ×3 IMPLANT
DRSG VAC ATS LRG SENSATRAC (GAUZE/BANDAGES/DRESSINGS) IMPLANT
DRSG VAC ATS MED SENSATRAC (GAUZE/BANDAGES/DRESSINGS) ×2 IMPLANT
DRSG VERSA FOAM LRG 10X15 (GAUZE/BANDAGES/DRESSINGS) ×2 IMPLANT
ELECT REM PT RETURN 9FT ADLT (ELECTROSURGICAL) ×3
ELECTRODE REM PT RTRN 9FT ADLT (ELECTROSURGICAL) ×1 IMPLANT
GLOVE BIO SURGEON STRL SZ7.5 (GLOVE) ×3 IMPLANT
GLOVE ECLIPSE 8.0 STRL XLNG CF (GLOVE) ×2 IMPLANT
GLOVE SURG SS PI 8.0 STRL IVOR (GLOVE) ×2 IMPLANT
GOWN STRL REUS W/ TWL LRG LVL3 (GOWN DISPOSABLE) ×3 IMPLANT
GOWN STRL REUS W/ TWL XL LVL3 (GOWN DISPOSABLE) ×1 IMPLANT
GOWN STRL REUS W/TWL 2XL LVL3 (GOWN DISPOSABLE) ×2 IMPLANT
GOWN STRL REUS W/TWL LRG LVL3 (GOWN DISPOSABLE)
GOWN STRL REUS W/TWL XL LVL3 (GOWN DISPOSABLE) ×6
HANDPIECE INTERPULSE COAX TIP (DISPOSABLE) ×3
IV NS IRRIG 3000ML ARTHROMATIC (IV SOLUTION) ×2 IMPLANT
KIT BASIN OR (CUSTOM PROCEDURE TRAY) ×3 IMPLANT
KIT TURNOVER KIT B (KITS) ×3 IMPLANT
NS IRRIG 1000ML POUR BTL (IV SOLUTION) ×3 IMPLANT
PACK GENERAL/GYN (CUSTOM PROCEDURE TRAY) ×3 IMPLANT
PACK UNIVERSAL I (CUSTOM PROCEDURE TRAY) ×2 IMPLANT
PAD ARMBOARD 7.5X6 YLW CONV (MISCELLANEOUS) ×6 IMPLANT
PAD NEG PRESSURE SENSATRAC (MISCELLANEOUS) ×1 IMPLANT
SET HNDPC FAN SPRY TIP SCT (DISPOSABLE) IMPLANT
SUT VIC AB 2-0 CTB1 (SUTURE) ×6 IMPLANT
TOWEL GREEN STERILE (TOWEL DISPOSABLE) ×3 IMPLANT
WATER STERILE IRR 1000ML POUR (IV SOLUTION) IMPLANT

## 2019-10-13 NOTE — Progress Notes (Signed)
  Progress Note    10/13/2019 8:22 AM Day of Surgery  Subjective:  No overnight issues  Vitals:   10/13/19 0407 10/13/19 0745  BP: (!) 109/58 (!) 133/41  Pulse: 71 100  Resp: (!) 9 15  Temp: 98 F (36.7 C) 98.5 F (36.9 C)  SpO2: 94%     Physical Exam: aaox3 Wound vac to suction right groin R great toe amp cdi with sutures in place  CBC    Component Value Date/Time   WBC 16.3 (H) 10/12/2019 0421   RBC 2.78 (L) 10/12/2019 0421   HGB 7.5 (L) 10/12/2019 0421   HGB 11.2 01/28/2018 1035   HGB 12.0 09/29/2011 1016   HCT 24.4 (L) 10/12/2019 0421   HCT 34.4 01/28/2018 1035   HCT 35.3 09/29/2011 1016   PLT 559 (H) 10/12/2019 0421   PLT 384 (H) 01/28/2018 1035   MCV 87.8 10/12/2019 0421   MCV 88 01/28/2018 1035   MCV 88.0 09/29/2011 1016   MCH 27.0 10/12/2019 0421   MCHC 30.7 10/12/2019 0421   RDW 17.8 (H) 10/12/2019 0421   RDW 17.6 (H) 01/28/2018 1035   RDW 14.8 (H) 09/29/2011 1016   LYMPHSABS 0.7 10/12/2019 0421   LYMPHSABS 0.6 (L) 09/29/2011 1016   MONOABS 1.1 (H) 10/12/2019 0421   MONOABS 0.8 09/29/2011 1016   EOSABS 0.0 10/12/2019 0421   EOSABS 0.3 09/29/2011 1016   BASOSABS 0.0 10/12/2019 0421   BASOSABS 0.1 09/29/2011 1016    BMET    Component Value Date/Time   NA 135 10/12/2019 0421   NA 142 01/28/2018 1035   K 4.0 10/12/2019 0421   CL 96 (L) 10/12/2019 0421   CO2 27 10/12/2019 0421   GLUCOSE 338 (H) 10/12/2019 0421   BUN 20 10/12/2019 0421   BUN 19 01/28/2018 1035   BUN 14.7 07/18/2014 1142   CREATININE 1.15 (H) 10/12/2019 0421   CREATININE 1.0 07/18/2014 1142   CALCIUM 8.5 (L) 10/12/2019 0421   GFRNONAA 49 (L) 10/12/2019 0421   GFRAA 56 (L) 10/12/2019 0421    INR    Component Value Date/Time   INR 1.2 09/29/2019 0628     Intake/Output Summary (Last 24 hours) at 10/13/2019 [id number] Last data filed at 10/13/2019 0702 Gross per 24 hour  Intake 650 ml  Output 3050 ml  Net -2400 ml     Assessment/plan:  68 y.o. female is s/p RCFEA now  with wound debridement and great toe amp. Plan to evaluate groin wound under anesthesia today and change wound vac.    [place] Marney C. Donzetta Matters, MD [facility] [place] Office: [phone] Pager: [phone]  10/13/2019 8:22 AM

## 2019-10-13 NOTE — Progress Notes (Signed)
ANTICOAGULATION CONSULT NOTE - Follow Up Consult  Pharmacy Consult for heparin Indication: h/o DVT  Labs: Recent Labs    10/12/19 0421 10/13/19 2125  HGB 7.5*  --   HCT 24.4*  --   PLT 559*  --   APTT  --  38*  HEPARINUNFRC  --  1.06*  CREATININE 1.15*  --     Assessment: 68yo female subtherapeutic on heparin with initial dosing while Xarelto on hold; no gtt issues or signs of bleeding per RN.  Goal of Therapy:  aPTT 66-102 seconds   Plan:  Will increase heparin gtt by 4 units/kgABW/hr to 1500 units/hr and check PTT in 8 hours.    Wynona Neat, PharmD, BCPS  10/13/2019,11:11 PM

## 2019-10-13 NOTE — Transfer of Care (Signed)
Immediate Anesthesia Transfer of Care Note  Patient: Lauren [name]  Procedure(s) Performed: [name] DEBRIDEMENT (Right Groin) APPLICATION OF WOUND VAC (Right Groin) SARTORIUS FLAP (Right Groin)  Patient Location: PACU  Anesthesia Type:General  Level of Consciousness: awake, alert  and oriented  Airway & Oxygen Therapy: Patient Spontanous Breathing and Patient connected to nasal cannula oxygen  Post-op Assessment: Report given to RN, Post -op Vital signs reviewed and stable and Patient moving all extremities  Post vital signs: Reviewed and stable  Last Vitals:  Vitals Value Taken Time  BP 133/69 10/13/19 1014  Temp    Pulse 89 10/13/19 1019  Resp 9 10/13/19 1019  SpO2 100 % 10/13/19 1019  Vitals shown include unvalidated device data.  Last Pain:  Vitals:   10/13/19 0745  TempSrc: Oral  PainSc:       Patients Stated Pain Goal: 0 ([id number])  Complications: No apparent anesthesia complications

## 2019-10-13 NOTE — Anesthesia Procedure Notes (Signed)
Procedure Name: LMA Insertion Date/Time: 10/13/2019 8:56 AM Performed by: Amadeo Garnet, CRNA Pre-anesthesia Checklist: Patient identified, Emergency Drugs available, Suction available, Patient being monitored and Timeout performed Patient Re-evaluated:Patient Re-evaluated prior to induction Oxygen Delivery Method: Circle system utilized Preoxygenation: Pre-oxygenation with 100% oxygen Induction Type: IV induction Ventilation: Mask ventilation without difficulty LMA: LMA inserted LMA Size: 4.0 Number of attempts: 1 Placement Confirmation: positive ETCO2 and breath sounds checked- equal and bilateral Dental Injury: Teeth and Oropharynx as per pre-operative assessment

## 2019-10-13 NOTE — Op Note (Signed)
    Patient name: Lauren [name] MRN: [id number] DOB: 11-20-1951 Sex: female  10/13/2019 Pre-operative Diagnosis: Right postsurgical wound Post-operative diagnosis:  Same Surgeon:  Erlene Quan C. Donzetta Matters, MD Procedure Performed: 1.  Sharp excisional debridement of right groin skin and soft tissue to 21 x 9 x 3 cm 2.  Right groin sartorius muscle rotational flap 3.  Pulse evacuation right groin wound with 3 L normal saline 4.  Application of wound VAC right groin wound  Indications: 68 year old female with history of right common femoral endarterectomy with vein patch angioplasty.  She 2 days ago underwent right groin debridement with wound VAC placement.  She is now indicated for rePete wound debridement.  Findings: Today I could not visualize the artery at least the vein patch this was palpable in the wound bed.  Wound was clean except at the edges which were debrided out to 21 cm x 9 cm all back to healthy-appearing tissue.  The sartorius muscle was quite diminutive although was easily rotated tacked medially over the artery.  A wound VAC was fashioned in place.  Patient will need repeat wound check likely in the operating room in 2-3 days.   Procedure:  The patient was identified in the holding area and taken to the operating room where she is placed supine upon table general anesthesia induced.  She was sterilely prepped and draped in the right groin usual fashion antibiotics were up-to-date timeout was called.  We began by evaluating the current wound.  There was some necrosis medially and laterally at the edges cephalad and caudally.  This was debrided with sharp excisional debridement with scissors.  We did have adequate bleeding and this was addressed with cautery.  The artery was visible in the wound bed.  I did elect to evaluate the sartorius muscle which was diminutive but appeared healthy.  I detached this from the anterior superior iliac spine.  I medially rotated this tacked it medially  to the was quite unhealthy tissue with interrupted 2-0 Vicryl suture in a mattress fashion.  I did have covered over the artery there.  We did Pulsavac the groin wound with 3 L saline.  We then fashioned a wound VAC placed this in the right groin there was to suction without leak.  She tolerated procedure without any complication.  All counts were correct at completion.  EBL: 100 cc   Jizel Cheeks C. Donzetta Matters, MD [facility] [place] Office: [phone] Pager: [phone]

## 2019-10-13 NOTE — Anesthesia Postprocedure Evaluation (Signed)
Anesthesia Post Note  Patient: Lauren [name]  Procedure(s) Performed: [name] DEBRIDEMENT (Right Groin) APPLICATION OF WOUND VAC (Right Groin) SARTORIUS FLAP (Right Groin)     Patient location during evaluation: PACU Anesthesia Type: General Level of consciousness: awake and alert Pain management: pain level controlled Vital Signs Assessment: post-procedure vital signs reviewed and stable Respiratory status: spontaneous breathing, nonlabored ventilation and respiratory function stable Cardiovascular status: blood pressure returned to baseline and stable Postop Assessment: no apparent nausea or vomiting Anesthetic complications: no    Last Vitals:  Vitals:   10/13/19 1129 10/13/19 1153  BP: 124/87 140/67  Pulse: 91 89  Resp: 17 13  Temp: 36.9 C 36.8 C  SpO2:  92%    Last Pain:  Vitals:   10/13/19 1223  TempSrc:   PainSc: [name]

## 2019-10-13 NOTE — Progress Notes (Signed)
Patient did not receive beta blocker before coming down for surgery. Dr Fransisco Beau made aware and was told not to give the beta blocker.

## 2019-10-13 NOTE — Progress Notes (Signed)
Physical Therapy Cancellation Note   10/13/19 0823  PT Visit Information  Last PT Received On 10/13/19  Reason Eval/Treat Not Completed Patient at procedure or test/unavailable. Pt in the OR for I&D. PT will continue to follow acutely.   Earney Navy, PTA Acute Rehabilitation Services Pager: [phone] Office: [phone]

## 2019-10-13 NOTE — Anesthesia Postprocedure Evaluation (Signed)
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

## 2019-10-13 NOTE — Progress Notes (Signed)
ANTICOAGULATION CONSULT NOTE - Initial Consult  Pharmacy Consult for Heparin Indication: h/o DVT  Allergies  Allergen Reactions  . Ambien [Zolpidem Tartrate] Other (See Comments)    Per the pt, "it makes me go crazy"  . Sulfa Antibiotics Rash  . Ativan [Lorazepam] Other (See Comments)    Patient has the opposite effect when taking medication   . Elavil [Amitriptyline Hcl] Other (See Comments)    Gastritis  . Prozac [Fluoxetine Hcl] Anxiety and Other (See Comments)    irritability     Patient Measurements: Height: 5' 7.48" (171.4 cm) Weight: 205 lb 4 oz (93.1 kg) IBW/kg (Calculated) : 62.7 Heparin Dosing Weight:  82.5 kg  Vital Signs: Temp: 98.2 F (36.8 C) (10/15 1153) Temp Source: Oral (10/15 1153) BP: 140/67 (10/15 1153) Pulse Rate: 89 (10/15 1153)  Labs: Recent Labs    10/12/19 0421  HGB 7.5*  HCT 24.4*  PLT 559*  CREATININE 1.15*    Estimated Creatinine Clearance: 54.6 mL/min (A) (by C-G formula based on SCr of 1.15 mg/dL (H)).   Medical History: Past Medical History:  Diagnosis Date  . Adenomatous colon polyp   . Anemia   . Anxiety   . AVM (arteriovenous malformation)    cecum  . CAD (coronary artery disease)   . Carotid artery occlusion   . Cataract    bil cateracts removed  . CHF (congestive heart failure) ([place])   . COPD (chronic obstructive pulmonary disease) ([place])   . Cough   . DDD (degenerative disc disease), lumbar   . Depression   . Diabetes mellitus    Type II  . DVT (deep venous thrombosis) ([place])    RLE 05/03/19  . Elevated LFTs   . Endometrial ca [facility])    endometrial ca dx 4/11  . Foot drop   . GERD (gastroesophageal reflux disease)   . Gout 2015  . Hernia of flank   . Hydronephrosis   . Hyperlipidemia   . Hypertension   . Insomnia   . Neuropathy   . OAB (overactive bladder)   . Osteoarthritis   . Peripheral neuropathy    following chemotherapy  . Peripheral vascular disease ([place])   . Pneumonia 07/2017  . Psoriasis   .  Pulmonary nodule    since 2016  . Radiation 05/01/2011-06/11/11   5600 cGy 28 fxs/periaortic  . Stroke [facility]) 2015   tia- Patient denies  . Thyroid nodule   . TIA (transient ischemic attack) 2015  . 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 Uterine cancer ([place])   . Ventral hernia    postoperative since 2009    Assessment:  Anticoag: Eliquis PTA for h/o femoral DVT (on PTA). LD Eliquis 10/14 PM.  Transition to IV heparin post-op. Hgb 7.8>7.5, Plts 500's.  Goal of Therapy:  Heparin level 0.3-0.7 units/ml  APTT 66-102 Monitor platelets by anticoagulation protocol: Yes   Plan:  HOLD Eliquis (LD 10/14 PM) Start IV heparin (no bolus, just back from OR) at 1150 units/hr Check aPTT and HL 6 hrs after heparin starts Daily HL, CBC, aPTT   Codie Krogh S. Alford Highland, PharmD, [place] Clinical Staff Pharmacist Eilene Ghazi Stillinger 10/13/2019,12:48 PM

## 2019-10-14 ENCOUNTER — Encounter (HOSPITAL_COMMUNITY): Payer: Self-pay | Admitting: Vascular Surgery

## 2019-10-14 LAB — APTT: aPTT: 87 seconds — ABNORMAL HIGH (ref 24–36)

## 2019-10-14 LAB — BASIC METABOLIC PANEL
Anion gap: 12 (ref 5–15)
BUN: 17 mg/dL (ref 8–23)
CO2: 26 mmol/L (ref 22–32)
Calcium: 8 mg/dL — ABNORMAL LOW (ref 8.9–10.3)
Chloride: 97 mmol/L — ABNORMAL LOW (ref 98–111)
Creatinine, Ser: 1.25 mg/dL — ABNORMAL HIGH (ref 0.44–1.00)
GFR calc Af Amer: 51 mL/min — ABNORMAL LOW (ref >60)
GFR calc non Af Amer: 44 mL/min — ABNORMAL LOW (ref >60)
Glucose, Bld: 198 mg/dL — ABNORMAL HIGH (ref 70–99)
Potassium: 3.4 mmol/L — ABNORMAL LOW (ref 3.5–5.1)
Sodium: 135 mmol/L (ref 135–145)

## 2019-10-14 LAB — CBC WITH DIFFERENTIAL/PLATELET
Abs Immature Granulocytes: 0.28 10*3/uL — ABNORMAL HIGH (ref 0.00–0.07)
Basophils Absolute: 0.1 10*3/uL (ref 0.0–0.1)
Basophils Relative: 1 %
Eosinophils Absolute: 0.1 10*3/uL (ref 0.0–0.5)
Eosinophils Relative: 1 %
HCT: 20.3 % — ABNORMAL LOW (ref 36.0–46.0)
Hemoglobin: 6.1 g/dL — CL (ref 12.0–15.0)
Immature Granulocytes: 2 %
Lymphocytes Relative: 11 %
Lymphs Abs: 1.6 10*3/uL (ref 0.7–4.0)
MCH: 26.5 pg (ref 26.0–34.0)
MCHC: 30 g/dL (ref 30.0–36.0)
MCV: 88.3 fL (ref 80.0–100.0)
Monocytes Absolute: 1.7 10*3/uL — ABNORMAL HIGH (ref 0.1–1.0)
Monocytes Relative: 11 %
Neutro Abs: 11.5 10*3/uL — ABNORMAL HIGH (ref 1.7–7.7)
Neutrophils Relative %: 74 %
Platelets: 692 10*3/uL — ABNORMAL HIGH (ref 150–400)
RBC: 2.3 MIL/uL — ABNORMAL LOW (ref 3.87–5.11)
RDW: 17.9 % — ABNORMAL HIGH (ref 11.5–15.5)
WBC: 15.3 10*3/uL — ABNORMAL HIGH (ref 4.0–10.5)
nRBC: 0 % (ref 0.0–0.2)

## 2019-10-14 LAB — GLUCOSE, CAPILLARY
Glucose-Capillary: 184 mg/dL — ABNORMAL HIGH (ref 70–99)
Glucose-Capillary: 189 mg/dL — ABNORMAL HIGH (ref 70–99)
Glucose-Capillary: 293 mg/dL — ABNORMAL HIGH (ref 70–99)
Glucose-Capillary: 190 mg/dL — ABNORMAL HIGH (ref 70–99)
Glucose-Capillary: 114 mg/dL — ABNORMAL HIGH (ref 70–99)

## 2019-10-14 LAB — PREPARE RBC (CROSSMATCH)

## 2019-10-14 LAB — HEPARIN LEVEL (UNFRACTIONATED): Heparin Unfractionated: 1.2 IU/mL — ABNORMAL HIGH (ref 0.30–0.70)

## 2019-10-14 MED ORDER — SODIUM CHLORIDE 0.9% IV SOLUTION
Freq: Once | INTRAVENOUS | Status: AC
  Administered 2019-10-24: 300 mL via INTRAVENOUS

## 2019-10-14 NOTE — Progress Notes (Addendum)
Occupational Therapy Treatment Patient Details Name: Lauren [name] MRN: [id number] DOB: [birth year] Today's Date: 10/14/2019    History of present illness Lauren [name] is a 68 y.o. (1951-06-08) female with past medical history significant for hypertension, hyperlipidemia, insulin-dependent diabetes mellitus, severe COPD, cor pulmonale, and CAD who presents with chief complaint worsening pain R foot and great toe.  pt to OR for SFA endarterectomies. Pt is now s/p RCFEA now with wound debridement and great toe amp. on 10/15.   OT comments  Pt progressing towards acute OT goals. Focus of session was bed mobility and functional transfer. Utilized maximove and +2 assist for OOB to recliner. D/c plan remains appropriate.    Follow Up Recommendations  SNF;Supervision/Assistance - 24 hour    Equipment Recommendations  Other (comment)(TBD next venue)    Recommendations for Other Services      Precautions / Restrictions Precautions Precautions: Fall Precaution Comments: right leg and right groin pain and tenderness to touch Restrictions Other Position/Activity Restrictions: WB through R heel, L orthotic and shoe for OOB       Mobility Bed Mobility Overal bed mobility: Needs Assistance Bed Mobility: Rolling Rolling: Mod assist         General bed mobility comments: rolled to each side with extra time and effort, increased pain   Transfers Overall transfer level: Needs assistance               General transfer comment: assist for rolling to each side for position of maxi move sling. Pt fearful but agreeable to total lift transfer. Assist to support RLE as she has significant limitations in R knee flexion and increased pain on R side. Due to configuration of equipment and RLE in extension positioned pt initially at a slight angle in the recliner. +2 total A utilized to pivot hips to more midline position, utilized maxi move pad for this.     Balance                                           ADL either performed or assessed with clinical judgement   ADL Overall ADL's : Needs assistance/impaired                                       General ADL Comments: Maxi move +2 assist utilized for OOB to recliner.      Vision       Perception     Praxis      Cognition Arousal/Alertness: Awake/alert Behavior During Therapy: WFL for tasks assessed/performed;Anxious Overall Cognitive Status: Within Functional Limits for tasks assessed                                          Exercises     Shoulder Instructions       General Comments      Pertinent Vitals/ Pain       Pain Assessment: Faces Faces Pain Scale: Hurts whole lot Pain Location: R foot, R hip and groin Pain Descriptors / Indicators: Grimacing;Guarding;Moaning Pain Intervention(s): Limited activity within patient's tolerance;Monitored during session;Repositioned  Home Living  Prior Functioning/Environment              Frequency  Min 2X/week        Progress Toward Goals  OT Goals(current goals can now be found in the care plan section)  Progress towards OT goals: Progressing toward goals  Acute Rehab OT Goals Patient Stated Goal: I want to go home with my husband OT Goal Formulation: With patient Time For Goal Achievement: 10/28/19 Potential to Achieve Goals: Good ADL Goals Pt Will Transfer to Toilet: with min assist;stand pivot transfer;bedside commode;regular height toilet Pt Will Perform Toileting - Clothing Manipulation and hygiene: with min assist;sitting/lateral leans;sit to/from stand Pt Will Perform Tub/Shower Transfer: with min assist;Stand pivot transfer;shower seat;grab bars Additional ADL Goal #1: Pt will complete bed mobility at min A level in preparation for safe BADL engagement  Plan Discharge plan remains appropriate    Co-evaluation     PT/OT/SLP Co-Evaluation/Treatment: Yes Reason for Co-Treatment: Complexity of the patient's impairments (multi-system involvement);For patient/therapist safety;To address functional/ADL transfers   OT goals addressed during session: ADL's and self-care;Proper use of Adaptive equipment and DME      AM-PAC OT "6 Clicks" Daily Activity     Outcome Measure   Help from another person eating meals?: None Help from another person taking care of personal grooming?: A Little Help from another person toileting, which includes using toliet, bedpan, or urinal?: Total Help from another person bathing (including washing, rinsing, drying)?: A Lot Help from another person to put on and taking off regular upper body clothing?: A Little Help from another person to put on and taking off regular lower body clothing?: Total 6 Click Score: 14    End of Session Equipment Utilized During Treatment: Other (comment)(maxi move)  OT Visit Diagnosis: Other abnormalities of gait and mobility (R26.89);Muscle weakness (generalized) (M62.81);Pain Pain - Right/Left: Right Pain - part of body: Leg   Activity Tolerance Patient limited by pain   Patient Left in chair;with call bell/phone within reach;with chair alarm set   Nurse Communication Mobility status        Time: [id number] OT Time Calculation (min): 34 min  Charges: OT General Charges $OT Visit: 1 Visit OT Treatments $Self Care/Home Management : 8-22 mins  Tyrone Schimke, OT Acute Rehabilitation Services Pager: [phone] Office: [phone]    Hortencia Pilar 10/14/2019, 12:17 PM

## 2019-10-14 NOTE — Progress Notes (Addendum)
[facility] [place]  Subjective  - No new complaints, foot pain is getting better.   Objective (!) 130/57 93 98.9 F (37.2 C) (Oral) 17 96%  Intake/Output Summary (Last 24 hours) at 10/14/2019 0725 Last data filed at 10/14/2019 0600 Gross per 24 hour  Intake 1484.11 ml  Output 2130 ml  Net -645.89 ml    Left GT amputation site healing well Wound vac to suction 200 cc in vac Lung non labored breathing  Assessment/Planning:   68 y.o. female is s/p RCFEA now with wound debridement and great toe amp. POD#1  Procedure Performed: 1.  Sharp excisional debridement of right groin skin and soft tissue to 21 x 9 x 3 cm 2.  Right groin sartorius muscle rotational flap 3.  Pulse evacuation right groin wound with 3 L normal saline 4.  Application of wound VAC right groin wound   Possible plan to return to the OR tomorrow for evaluation of muscle flap and possible further intervention. Continue current treatment  Roxy Horseman 10/14/2019 7:25 AM --  Laboratory Lab Results: Recent Labs    10/12/19 0421 10/14/19 0529  WBC 16.3* 15.3*  HGB 7.5* 6.1*  HCT 24.4* 20.3*  PLT 559* 692*   BMET Recent Labs    10/12/19 0421 10/14/19 0529  NA 135 135  K 4.0 3.4*  CL 96* 97*  CO2 27 26  GLUCOSE 338* 198*  BUN 20 17  CREATININE 1.15* 1.25*  CALCIUM 8.5* 8.0*    COAG Lab Results  Component Value Date   INR 1.2 09/29/2019   INR 1.1 09/15/2019   INR 1.14 08/22/2017   No results found for: PTT  I have independently interviewed and examined patient and agree with PA assessment and plan above.  Plan will be to return to the operating room this weekend for evaluation of wound under anesthesia.  Right great toe amputation appears to be healing well.  Continue heparin.  Will transfuse 2 units packed red blood cells but does not appear to be actively bleeding.  Discontinue antibiotics.  Filippo Puls C. Donzetta Matters, MD [facility]  [place] Office: [phone] Pager: [phone]

## 2019-10-14 NOTE — H&P (View-Only) (Signed)
[facility] [place]  Subjective  - No new complaints, foot pain is getting better.   Objective (!) 130/57 93 98.9 F (37.2 C) (Oral) 17 96%  Intake/Output Summary (Last 24 hours) at 10/14/2019 0725 Last data filed at 10/14/2019 0600 Gross per 24 hour  Intake 1484.11 ml  Output 2130 ml  Net -645.89 ml    Left GT amputation site healing well Wound vac to suction 200 cc in vac Lung non labored breathing  Assessment/Planning:   68 y.o. female is s/p RCFEA now with wound debridement and great toe amp. POD#1  Procedure Performed: 1.  Sharp excisional debridement of right groin skin and soft tissue to 21 x 9 x 3 cm 2.  Right groin sartorius muscle rotational flap 3.  Pulse evacuation right groin wound with 3 L normal saline 4.  Application of wound VAC right groin wound   Possible plan to return to the OR tomorrow for evaluation of muscle flap and possible further intervention. Continue current treatment  Roxy Horseman 10/14/2019 7:25 AM --  Laboratory Lab Results: Recent Labs    10/12/19 0421 10/14/19 0529  WBC 16.3* 15.3*  HGB 7.5* 6.1*  HCT 24.4* 20.3*  PLT 559* 692*   BMET Recent Labs    10/12/19 0421 10/14/19 0529  NA 135 135  K 4.0 3.4*  CL 96* 97*  CO2 27 26  GLUCOSE 338* 198*  BUN 20 17  CREATININE 1.15* 1.25*  CALCIUM 8.5* 8.0*    COAG Lab Results  Component Value Date   INR 1.2 09/29/2019   INR 1.1 09/15/2019   INR 1.14 08/22/2017   No results found for: PTT  I have independently interviewed and examined patient and agree with PA assessment and plan above.  Plan will be to return to the operating room this weekend for evaluation of wound under anesthesia.  Right great toe amputation appears to be healing well.  Continue heparin.  Will transfuse 2 units packed red blood cells but does not appear to be actively bleeding.  Discontinue antibiotics.  Ruthell Feigenbaum C. Donzetta Matters, MD [facility]  [place] Office: [phone] Pager: [phone]

## 2019-10-14 NOTE — Progress Notes (Signed)
ANTICOAGULATION CONSULT NOTE - f/u Consult  Pharmacy Consult for Heparin + Vanco/Zosyn Indication: h/o DVT + wound necrosis in the right groin.  Allergies  Allergen Reactions  . Ambien [Zolpidem Tartrate] Other (See Comments)    Per the pt, "it makes me go crazy"  . Sulfa Antibiotics Rash  . Ativan [Lorazepam] Other (See Comments)    Patient has the opposite effect when taking medication   . Elavil [Amitriptyline Hcl] Other (See Comments)    Gastritis  . Prozac [Fluoxetine Hcl] Anxiety and Other (See Comments)    irritability     Patient Measurements: Height: 5' 7.48" (171.4 cm) Weight: 205 lb 4 oz (93.1 kg) IBW/kg (Calculated) : 62.7 Heparin Dosing Weight:  82.5 kg  Vital Signs: Temp: 98.6 F (37 C) (10/16 0828) Temp Source: Oral (10/16 0828) BP: 140/51 (10/16 0828) Pulse Rate: 99 (10/16 0828)  Labs: Recent Labs    10/12/19 0421 10/13/19 2125 10/14/19 0529 10/14/19 0825  HGB 7.5*  --  6.1*  --   HCT 24.4*  --  20.3*  --   PLT 559*  --  692*  --   APTT  --  38*  --  87*  HEPARINUNFRC  --  1.06*  --  1.20*  CREATININE 1.15*  --  1.25*  --     Estimated Creatinine Clearance: 50.2 mL/min (A) (by C-G formula based on SCr of 1.25 mg/dL (H)).   Medical History: Past Medical History:  Diagnosis Date  . Adenomatous colon polyp   . Anemia   . Anxiety   . AVM (arteriovenous malformation)    cecum  . CAD (coronary artery disease)   . Carotid artery occlusion   . Cataract    bil cateracts removed  . CHF (congestive heart failure) ([place])   . COPD (chronic obstructive pulmonary disease) ([place])   . Cough   . DDD (degenerative disc disease), lumbar   . Depression   . Diabetes mellitus    Type II  . DVT (deep venous thrombosis) ([place])    RLE 05/03/19  . Elevated LFTs   . Endometrial ca [facility])    endometrial ca dx 4/11  . Foot drop   . GERD (gastroesophageal reflux disease)   . Gout 2015  . Hernia of flank   . Hydronephrosis   . Hyperlipidemia   . Hypertension    . Insomnia   . Neuropathy   . OAB (overactive bladder)   . Osteoarthritis   . Peripheral neuropathy    following chemotherapy  . Peripheral vascular disease ([place])   . Pneumonia 07/2017  . Psoriasis   . Pulmonary nodule    since 2016  . Radiation 05/01/2011-06/11/11   5600 cGy 28 fxs/periaortic  . Stroke [facility]) 2015   tia- Patient denies  . Thyroid nodule   . TIA (transient ischemic attack) 2015  . 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 Uterine cancer ([place])   . Ventral hernia    postoperative since 2009    Assessment:  Anticoag: Eliquis PTA for femoral DVT (on PTA). LD Eliquis 10/14 PM.  Transition to IV heparin post-op 10/15. HL's remain elevated due to Eliquis. APTT 87. Hgb 7.8>7.5>6.1, Plts 500's. Give 2 units PRBC on 10/16.  ID: Vanc/Zosyn for R great toe gangrene (now amputated) and wound necrosis in the right groin. Immunosuppressed patient.  No cultures WBC 15.3. Afebrile. Scr 1.25 up slightly -10/13: R great toe amputation and  I&D and VAC placement of  R groin (MD notes Superficial wound was all necrotic did not appear grossly infected)   - 10/15: Groin debridement + wound vac  Chronic Macrobid 10/1 >> 10/11 Ciprofloxacin 10/9 >> 10/11 Vancomycin 10/11 >> Zosyn 10/11 >>  10/11: Vancomycin 2000 mg IV once, then 1000 mg IV Q 24 hrs.  (est AUC: 511, SCr 1.16, Vd 0.5)  Goal of Therapy:  Heparin level 0.3-0.7 units/ml  APTT 66-102 Monitor platelets by anticoagulation protocol: Yes   Plan:  HOLD Eliquis (LD 10/14 PM) IV heparin (no bolus) 1500 units/hr. Check confirmatory level in 6 hrs. Give 2 units PRBC Daily HL and CBC  Vancomycin 1000 mg IV Q 24 hrs. (not charted 10/13, await new [place] state for levels). Adjust dose if Scr increases further. Zosyn 3.375g IV q8h (4 hour infusion) per MD   Latreshia Beauchaine S. Alford Highland, PharmD, [place] Clinical Staff Pharmacist Eilene Ghazi Stillinger 10/14/2019,9:53 AM

## 2019-10-14 NOTE — Progress Notes (Signed)
Physical Therapy Treatment Patient Details Name: Lauren [name] MRN: [id number] DOB: 16-Jul-1951 Today's Date: 10/14/2019    History of Present Illness Lauren [name] is a 68 y.o. (Oct 04, 1951) female with past medical history significant for hypertension, hyperlipidemia, insulin-dependent diabetes mellitus, severe COPD, cor pulmonale, and CAD who presents with chief complaint worsening pain R foot and great toe.  pt to OR for SFA endarterectomies. Pt is now s/p RCFEA now with wound debridement and great toe amp. on 10/13.  Had R groin I&D and sartoirus muscle flap with wound vac placed on 10/15.    PT Comments    Patient with limited progress due to pain in groin and with new wound vac expecting return to OR tomorrow.  Reported previous transfers with very difficulty so used maximove lift for OOB today and educated nursing how to assist to bed as pt with limited AROM in LE's and needed to be placed in at angle.  Feel she will need extensive time to rehab so would plan for SNF at d/c.    Follow Up Recommendations  SNF     Equipment Recommendations  None recommended by PT    Recommendations for Other Services       Precautions / Restrictions Precautions Precautions: Fall Precaution Comments: right leg and right groin pain and tenderness to touch Restrictions Weight Bearing Restrictions: No Other Position/Activity Restrictions: WB through R heel, L orthotic and shoe for OOB    Mobility  Bed Mobility Overal bed mobility: Needs Assistance Bed Mobility: Rolling Rolling: Mod assist;+2 for physical assistance         General bed mobility comments: rolled to each side with extra time and effort, increased pain   Transfers Overall transfer level: Needs assistance               General transfer comment: total A for OOB to chair via maximove  Ambulation/Gait                 Stairs             Wheelchair Mobility    Modified Rankin (Stroke Patients  Only)       Balance Overall balance assessment: Needs assistance   Sitting balance-Leahy Scale: Poor Sitting balance - Comments: placed in chair, pt able to pull forward with armrests                                    Cognition Arousal/Alertness: Awake/alert Behavior During Therapy: Anxious Overall Cognitive Status: Within Functional Limits for tasks assessed                                        Exercises      General Comments General comments (skin integrity, edema, etc.): limited knee flexion bilaterally so placed in chair via lift at angle due to unable to clear legs from post on lift.      Pertinent Vitals/Pain Pain Assessment: Faces Faces Pain Scale: Hurts whole lot Pain Location: R foot, R hip and groin Pain Descriptors / Indicators: Grimacing;Guarding;Moaning Pain Intervention(s): Monitored during session;Repositioned;Premedicated before session    Home Living                      Prior Function            PT Goals (  current goals can now be found in the care plan section) Progress towards PT goals: Not progressing toward goals - comment(limited due to pain, decreased ROM in legs)    Frequency    Min 2X/week      PT Plan Current plan remains appropriate    Co-evaluation PT/OT/SLP Co-Evaluation/Treatment: Yes Reason for Co-Treatment: Complexity of the patient's impairments (multi-system involvement);For patient/therapist safety;To address functional/ADL transfers PT goals addressed during session: Mobility/safety with mobility        AM-PAC PT "6 Clicks" Mobility   Outcome Measure  Help needed turning from your back to your side while in a flat bed without using bedrails?: A Lot Help needed moving from lying on your back to sitting on the side of a flat bed without using bedrails?: Total Help needed moving to and from a bed to a chair (including a wheelchair)?: Total Help needed standing up from a chair  using your arms (e.g., wheelchair or bedside chair)?: Total Help needed to walk in hospital room?: Total Help needed climbing 3-5 steps with a railing? : Total 6 Click Score: 7    End of Session   Activity Tolerance: Patient limited by pain Patient left: in chair;with call bell/phone within reach;with chair alarm set Nurse Communication: Mobility status;Need for lift equipment PT Visit Diagnosis: Muscle weakness (generalized) (M62.81);Unsteadiness on feet (R26.81);Pain;Other abnormalities of gait and mobility (R26.89) Pain - Right/Left: Right Pain - part of body: Leg     Time: 1111-1145 PT Time Calculation (min) (ACUTE ONLY): 34 min  Charges:  $Therapeutic Activity: 8-22 mins                     Lauren [name], [place] [facility] [phone] 10/14/2019    Lauren [name] 10/14/2019, 5:10 PM

## 2019-10-14 NOTE — Care Management Important Message (Signed)
Important Message  Patient Details  Name: Lauren [name] MRN: [id number] Date of Birth: Nov 25, 1951   Medicare Important Message Given:  Yes     [name] 10/14/2019, 12:08 PM

## 2019-10-14 NOTE — Progress Notes (Signed)
Pt blood transfusion ended, no sign of distress noted pt resting in bed eyes closed. VSS. Will continue to monitor.

## 2019-10-14 NOTE — Progress Notes (Signed)
crCRITICAL VALUE ALERT  Critical Value:  hgb 6.1  Date & Time Notied:  0630  Provider Notified: Dr Donzetta Matters  Orders Received/Actions taken: give two units of blood

## 2019-10-15 ENCOUNTER — Inpatient Hospital Stay (HOSPITAL_COMMUNITY): Payer: Medicare Other | Admitting: Anesthesiology

## 2019-10-15 ENCOUNTER — Encounter (HOSPITAL_COMMUNITY): Payer: Self-pay | Admitting: Certified Registered Nurse Anesthetist

## 2019-10-15 ENCOUNTER — Encounter (HOSPITAL_COMMUNITY)
Admission: RE | Disposition: A | Discharge status: Hospice | Payer: Self-pay | Source: Home / Self Care | Attending: Vascular Surgery

## 2019-10-15 HISTORY — PX: GROIN DEBRIDEMENT: SHX5159

## 2019-10-15 HISTORY — PX: APPLICATION OF WOUND VAC: SHX5189

## 2019-10-15 LAB — APTT
aPTT: 116 seconds — ABNORMAL HIGH (ref 24–36)
aPTT: 35 seconds (ref 24–36)
aPTT: 68 seconds — ABNORMAL HIGH (ref 24–36)

## 2019-10-15 LAB — BPAM RBC
Blood Product Expiration Date: 202011172359
Blood Product Expiration Date: 202011172359
ISSUE DATE / TIME: 202010161330
ISSUE DATE / TIME: 202010161831
Unit Type and Rh: 7300
Unit Type and Rh: 7300

## 2019-10-15 LAB — TYPE AND SCREEN
ABO/RH(D): B POS
Antibody Screen: NEGATIVE
Unit division: 0
Unit division: 0

## 2019-10-15 LAB — GLUCOSE, CAPILLARY
Glucose-Capillary: 123 mg/dL — ABNORMAL HIGH (ref 70–99)
Glucose-Capillary: 122 mg/dL — ABNORMAL HIGH (ref 70–99)
Glucose-Capillary: 144 mg/dL — ABNORMAL HIGH (ref 70–99)
Glucose-Capillary: 183 mg/dL — ABNORMAL HIGH (ref 70–99)
Glucose-Capillary: 149 mg/dL — ABNORMAL HIGH (ref 70–99)

## 2019-10-15 LAB — CBC
HCT: 28.9 % — ABNORMAL LOW (ref 36.0–46.0)
Hemoglobin: 9.6 g/dL — ABNORMAL LOW (ref 12.0–15.0)
MCH: 29.3 pg (ref 26.0–34.0)
MCHC: 33.2 g/dL (ref 30.0–36.0)
MCV: 88.1 fL (ref 80.0–100.0)
Platelets: 625 10*3/uL — ABNORMAL HIGH (ref 150–400)
RBC: 3.28 MIL/uL — ABNORMAL LOW (ref 3.87–5.11)
RDW: 15.7 % — ABNORMAL HIGH (ref 11.5–15.5)
WBC: 17.1 10*3/uL — ABNORMAL HIGH (ref 4.0–10.5)
nRBC: 0.4 % — ABNORMAL HIGH (ref 0.0–0.2)

## 2019-10-15 LAB — HEPARIN LEVEL (UNFRACTIONATED): Heparin Unfractionated: 0.98 IU/mL — ABNORMAL HIGH (ref 0.30–0.70)

## 2019-10-15 SURGERY — DEBRIDEMENT, INGUINAL REGION
Anesthesia: General | Laterality: Right

## 2019-10-15 MED ORDER — HYDROMORPHONE HCL 1 MG/ML IJ SOLN
0.2500 mg | INTRAMUSCULAR | Status: DC | PRN
  Administered 2019-10-15: 0.5 mg via INTRAVENOUS

## 2019-10-15 MED ORDER — SUCCINYLCHOLINE CHLORIDE 200 MG/10ML IV SOSY
PREFILLED_SYRINGE | INTRAVENOUS | Status: AC
  Filled 2019-10-15: qty 10

## 2019-10-15 MED ORDER — 0.9 % SODIUM CHLORIDE (POUR BTL) OPTIME
TOPICAL | Status: DC | PRN
  Administered 2019-10-15: 1000 mL

## 2019-10-15 MED ORDER — ONDANSETRON HCL 4 MG/2ML IJ SOLN
INTRAMUSCULAR | Status: AC
  Filled 2019-10-15: qty 2

## 2019-10-15 MED ORDER — ONDANSETRON HCL 4 MG/2ML IJ SOLN
INTRAMUSCULAR | Status: DC | PRN
  Administered 2019-10-15: 4 mg via INTRAVENOUS

## 2019-10-15 MED ORDER — HYDROMORPHONE HCL 1 MG/ML IJ SOLN
INTRAMUSCULAR | Status: AC
  Filled 2019-10-15: qty 1

## 2019-10-15 MED ORDER — LIDOCAINE 2% (20 MG/ML) 5 ML SYRINGE
INTRAMUSCULAR | Status: AC
  Filled 2019-10-15: qty 5

## 2019-10-15 MED ORDER — PHENYLEPHRINE 40 MCG/ML (10ML) SYRINGE FOR IV PUSH (FOR BLOOD PRESSURE SUPPORT)
PREFILLED_SYRINGE | INTRAVENOUS | Status: DC | PRN
  Administered 2019-10-15: 80 ug via INTRAVENOUS

## 2019-10-15 MED ORDER — STERILE WATER FOR IRRIGATION IR SOLN
Status: DC | PRN
  Administered 2019-10-15: 1000 mL

## 2019-10-15 MED ORDER — PHENYLEPHRINE 40 MCG/ML (10ML) SYRINGE FOR IV PUSH (FOR BLOOD PRESSURE SUPPORT)
PREFILLED_SYRINGE | INTRAVENOUS | Status: AC
  Filled 2019-10-15: qty 10

## 2019-10-15 MED ORDER — LIDOCAINE 2% (20 MG/ML) 5 ML SYRINGE
INTRAMUSCULAR | Status: DC | PRN
  Administered 2019-10-15: 100 mg via INTRAVENOUS

## 2019-10-15 MED ORDER — MEPERIDINE HCL 25 MG/ML IJ SOLN: 6.2500 mg | INTRAMUSCULAR | Status: DC | PRN

## 2019-10-15 MED ORDER — CEFAZOLIN SODIUM-DEXTROSE 2-3 GM-%(50ML) IV SOLR
INTRAVENOUS | Status: DC | PRN
  Administered 2019-10-15: 2 g via INTRAVENOUS

## 2019-10-15 MED ORDER — ONDANSETRON HCL 4 MG/2ML IJ SOLN
4.0000 mg | Freq: Once | INTRAMUSCULAR | Status: AC | PRN
  Administered 2019-10-15: 4 mg via INTRAVENOUS

## 2019-10-15 MED ORDER — LACTATED RINGERS IV SOLN
INTRAVENOUS | Status: DC
  Administered 2019-10-15: 08:00:00 via INTRAVENOUS

## 2019-10-15 MED ORDER — FENTANYL CITRATE (PF) 100 MCG/2ML IJ SOLN
INTRAMUSCULAR | Status: DC | PRN
  Administered 2019-10-15 (×2): 50 ug via INTRAVENOUS
  Administered 2019-10-15 (×2): 25 ug via INTRAVENOUS
  Administered 2019-10-15: 50 ug via INTRAVENOUS

## 2019-10-15 MED ORDER — PROPOFOL 10 MG/ML IV BOLUS
INTRAVENOUS | Status: DC | PRN
  Administered 2019-10-15: 100 mg via INTRAVENOUS
  Administered 2019-10-15: 50 mg via INTRAVENOUS

## 2019-10-15 MED ORDER — CEFAZOLIN SODIUM 1 G IJ SOLR
INTRAMUSCULAR | Status: AC
  Filled 2019-10-15: qty 20

## 2019-10-15 MED ORDER — FENTANYL CITRATE (PF) 250 MCG/5ML IJ SOLN
INTRAMUSCULAR | Status: AC
  Filled 2019-10-15: qty 5

## 2019-10-15 MED ORDER — PROPOFOL 10 MG/ML IV BOLUS
INTRAVENOUS | Status: AC
  Filled 2019-10-15: qty 20

## 2019-10-15 SURGICAL SUPPLY — 38 items
APL SKNCLS STERI-STRIP NONHPOA (GAUZE/BANDAGES/DRESSINGS) ×4
BENZOIN TINCTURE PRP APPL 2/3 (GAUZE/BANDAGES/DRESSINGS) ×8 IMPLANT
CANISTER SUCT 3000ML PPV (MISCELLANEOUS) ×3 IMPLANT
CANISTER WOUND CARE 500ML ATS (WOUND CARE) ×2 IMPLANT
COVER WAND RF STERILE (DRAPES) ×1 IMPLANT
DRSG VAC ATS MED SENSATRAC (GAUZE/BANDAGES/DRESSINGS) ×2 IMPLANT
DRSG VERSA FOAM LRG 10X15 (GAUZE/BANDAGES/DRESSINGS) ×2 IMPLANT
ELECT REM PT RETURN 9FT ADLT (ELECTROSURGICAL) ×3
ELECTRODE REM PT RTRN 9FT ADLT (ELECTROSURGICAL) ×1 IMPLANT
GAUZE SPONGE 4X4 12PLY STRL (GAUZE/BANDAGES/DRESSINGS) ×3 IMPLANT
GLOVE BIO SURGEON STRL SZ 6.5 (GLOVE) ×1 IMPLANT
GLOVE BIO SURGEONS STRL SZ 6.5 (GLOVE) ×1
GLOVE BIOGEL PI IND STRL 6.5 (GLOVE) IMPLANT
GLOVE BIOGEL PI IND STRL 7.5 (GLOVE) IMPLANT
GLOVE BIOGEL PI IND STRL 8 (GLOVE) IMPLANT
GLOVE BIOGEL PI INDICATOR 6.5 (GLOVE) ×2
GLOVE BIOGEL PI INDICATOR 7.5 (GLOVE) ×4
GLOVE BIOGEL PI INDICATOR 8 (GLOVE) ×2
GLOVE SS BIOGEL STRL SZ 7 (GLOVE) ×1 IMPLANT
GLOVE SUPERSENSE BIOGEL SZ 7 (GLOVE)
GLOVE SURG SS PI 7.5 STRL IVOR (GLOVE) ×6 IMPLANT
GOWN STRL REUS W/ TWL LRG LVL3 (GOWN DISPOSABLE) ×3 IMPLANT
GOWN STRL REUS W/ TWL XL LVL3 (GOWN DISPOSABLE) IMPLANT
GOWN STRL REUS W/TWL LRG LVL3 (GOWN DISPOSABLE) ×9
GOWN STRL REUS W/TWL XL LVL3 (GOWN DISPOSABLE) ×3
KIT BASIN OR (CUSTOM PROCEDURE TRAY) ×3 IMPLANT
KIT TURNOVER KIT B (KITS) ×3 IMPLANT
NS IRRIG 1000ML POUR BTL (IV SOLUTION) ×3 IMPLANT
PACK GENERAL/GYN (CUSTOM PROCEDURE TRAY) ×3 IMPLANT
PACK UNIVERSAL I (CUSTOM PROCEDURE TRAY) ×3 IMPLANT
PAD ARMBOARD 7.5X6 YLW CONV (MISCELLANEOUS) ×6 IMPLANT
STAPLER VISISTAT 35W (STAPLE) IMPLANT
SUT ETHILON 3 0 PS 1 (SUTURE) IMPLANT
SUT VIC AB 2-0 CTX 36 (SUTURE) IMPLANT
SUT VIC AB 3-0 SH 27 (SUTURE)
SUT VIC AB 3-0 SH 27X BRD (SUTURE) IMPLANT
TOWEL GREEN STERILE (TOWEL DISPOSABLE) ×3 IMPLANT
WATER STERILE IRR 1000ML POUR (IV SOLUTION) ×3 IMPLANT

## 2019-10-15 NOTE — Interval H&P Note (Signed)
History and Physical Interval Note:  10/15/2019 8:09 AM  Lauren [name]  has presented today for surgery, with the diagnosis of right groin wound.  The various methods of treatment have been discussed with the patient and family. After consideration of risks, benefits and other options for treatment, the patient has consented to  Procedure(s): GROIN DEBRIDEMENT (Right) APPLICATION OF WOUND VAC (Right) as a surgical intervention.  The patient's history has been reviewed, patient examined, no change in status, stable for surgery.  I have reviewed the patient's chart and labs.  Questions were answered to the patient's satisfaction.     Annamarie Major

## 2019-10-15 NOTE — Progress Notes (Signed)
ANTICOAGULATION CONSULT NOTE - f/u Consult  Pharmacy Consult for Heparin  Indication: h/o DVT + wound necrosis in the right groin.  Allergies  Allergen Reactions  . Ambien [Zolpidem Tartrate] Other (See Comments)    Per the pt, "it makes me go crazy"  . Sulfa Antibiotics Rash  . Ativan [Lorazepam] Other (See Comments)    Patient has the opposite effect when taking medication   . Elavil [Amitriptyline Hcl] Other (See Comments)    Gastritis  . Prozac [Fluoxetine Hcl] Anxiety and Other (See Comments)    irritability     Patient Measurements: Height: 5' 7.48" (171.4 cm) Weight: 205 lb 4 oz (93.1 kg) IBW/kg (Calculated) : 62.7 Heparin Dosing Weight:  82.5 kg  Vital Signs: Temp: 98.4 F (36.9 C) (10/17 1020) Temp Source: Axillary (10/17 1020) BP: 143/83 (10/17 1020) Pulse Rate: 97 (10/17 1005)  Labs: Recent Labs    10/13/19 2125 10/14/19 0529 10/14/19 0825 10/15/19 0330  HGB  --  6.1*  --  9.6*  HCT  --  20.3*  --  28.9*  PLT  --  692*  --  625*  APTT 38*  --  87* 116*  HEPARINUNFRC 1.06*  --  1.20* 0.98*  CREATININE  --  1.25*  --   --     Estimated Creatinine Clearance: 50.2 mL/min (A) (by C-G formula based on SCr of 1.25 mg/dL (H)).   Medical History: Past Medical History:  Diagnosis Date  . Adenomatous colon polyp   . Anemia   . Anxiety   . AVM (arteriovenous malformation)    cecum  . CAD (coronary artery disease)   . Carotid artery occlusion   . Cataract    bil cateracts removed  . CHF (congestive heart failure) ([place])   . COPD (chronic obstructive pulmonary disease) ([place])   . Cough   . DDD (degenerative disc disease), lumbar   . Depression   . Diabetes mellitus    Type II  . DVT (deep venous thrombosis) ([place])    RLE 05/03/19  . Elevated LFTs   . Endometrial ca [facility])    endometrial ca dx 4/11  . Foot drop   . GERD (gastroesophageal reflux disease)   . Gout 2015  . Hernia of flank   . Hydronephrosis   . Hyperlipidemia   . Hypertension   .  Insomnia   . Neuropathy   . OAB (overactive bladder)   . Osteoarthritis   . Peripheral neuropathy    following chemotherapy  . Peripheral vascular disease ([place])   . Pneumonia 07/2017  . Psoriasis   . Pulmonary nodule    since 2016  . Radiation 05/01/2011-06/11/11   5600 cGy 28 fxs/periaortic  . Stroke [facility]) 2015   tia- Patient denies  . Thyroid nodule   . TIA (transient ischemic attack) 2015  . 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 Uterine cancer ([place])   . Ventral hernia    postoperative since 2009    Assessment:  Anticoag: Eliquis PTA for femoral DVT (on PTA). LD Eliquis 10/14 PM.  Transition to IV heparin post-op 10/15. HL's remain elevated due to Eliquis.  HL overnight elev at 0.98 with supratherapeutic aPTT at 116. Dose was dec from 1500 units/hr to 1400 units/hr.   1200 caPTT is subtherapeutic at 35 . H&H today is increased at 9.6/28.9 after RBC infusion 10/16. After calling nursing, heparin was held while patient was in [place] since ~0700 this am.  Per nurse Trixie who spoke with Dr. Trula Slade, plan to re-start heparin infusion now with no bolus.   Goal of Therapy:  Heparin level 0.3-0.7 units/ml  APTT 66-102 Monitor platelets by anticoagulation protocol: Yes   Plan:  restart IV heparin at 1400 units/hour   8 hour aPTT HOLD Eliquis (LD 10/14 PM) Daily HL and CBC Follow up re-start Eliquis  Monitor signs of bleeding    Thank you,   Eddie Candle, PharmD PGY-1 Pharmacy Resident   Please check amion for clinical pharmacist contact number

## 2019-10-15 NOTE — OR Nursing (Signed)
Immediately prior to anesthetic induction, patient shared that her throat felt sore and dry.  Symptom communicated to both anesthesia provider and surgeon.  No new orders received.

## 2019-10-15 NOTE — Transfer of Care (Signed)
Immediate Anesthesia Transfer of Care Note  Patient: Lauren [name]  Procedure(s) Performed: [name] DEBRIDEMENT (Right ) APPLICATION OF WOUND VAC (Right )  Patient Location: PACU  Anesthesia Type:General  Level of Consciousness: awake, alert  and oriented  Airway & Oxygen Therapy: Patient Spontanous Breathing and Patient connected to nasal cannula oxygen  Post-op Assessment: Report given to RN, Post -op Vital signs reviewed and stable and Patient moving all extremities  Post vital signs: Reviewed and stable  Last Vitals:  Vitals Value Taken Time  BP 164/94 10/15/19 0947  Temp    Pulse 97 10/15/19 0951  Resp 14 10/15/19 0951  SpO2 100 % 10/15/19 0951  Vitals shown include unvalidated device data.  Last Pain:  Vitals:   10/15/19 0512  TempSrc: Oral  PainSc:       Patients Stated Pain Goal: 4 ([id number])  Complications: No apparent anesthesia complications

## 2019-10-15 NOTE — Anesthesia Postprocedure Evaluation (Signed)
Anesthesia Post Note  Patient: Lauren [name]  Procedure(s) Performed: [name] DEBRIDEMENT (Right ) APPLICATION OF WOUND VAC (Right )     Patient location during evaluation: PACU Anesthesia Type: General Level of consciousness: awake and alert Pain management: pain level controlled Vital Signs Assessment: post-procedure vital signs reviewed and stable Respiratory status: spontaneous breathing, nonlabored ventilation, respiratory function stable and patient connected to nasal cannula oxygen Cardiovascular status: blood pressure returned to baseline and stable Postop Assessment: no apparent nausea or vomiting Anesthetic complications: no    Last Vitals:  Vitals:   10/15/19 0945 10/15/19 1005  BP: (!) 164/94 (!) 145/72  Pulse: (!) 101 97  Resp: 17 15  Temp: 36.7 C   SpO2: 100% 96%    Last Pain:  Vitals:   10/15/19 0512  TempSrc: Oral  PainSc:                  Decklin Weddington DAVID

## 2019-10-15 NOTE — Anesthesia Preprocedure Evaluation (Signed)
Anesthesia Evaluation  Patient identified by MRN, date of birth, ID band Patient awake    Reviewed: Allergy & Precautions, NPO status , Patient's Chart, lab work & pertinent test results  Airway Mallampati: I  TM Distance: >3 FB Neck ROM: Full    Dental   Pulmonary COPD, former smoker,    Pulmonary exam normal        Cardiovascular hypertension, Pt. on medications + CAD  Normal cardiovascular exam     Neuro/Psych Anxiety Depression CVA    GI/Hepatic GERD  Medicated and Controlled,  Endo/Other  diabetes  Renal/GU      Musculoskeletal   Abdominal   Peds  Hematology   Anesthesia Other Findings   Reproductive/Obstetrics                             Anesthesia Physical Anesthesia Plan  ASA: III  Anesthesia Plan: General   Post-op Pain Management:    Induction: Intravenous  PONV Risk Score and Plan: 3 and Midazolam, Ondansetron and Treatment may vary due to age or medical condition  Airway Management Planned: LMA  Additional Equipment:   Intra-op Plan:   Post-operative Plan: Extubation in OR  Informed Consent: I have reviewed the patients History and Physical, chart, labs and discussed the procedure including the risks, benefits and alternatives for the proposed anesthesia with the patient or authorized representative who has indicated his/her understanding and acceptance.       Plan Discussed with: CRNA and Surgeon  Anesthesia Plan Comments:         Anesthesia Quick Evaluation

## 2019-10-15 NOTE — Progress Notes (Signed)
ANTICOAGULATION CONSULT NOTE - Follow Up Consult  Pharmacy Consult for heparin Indication: h/o DVT  Labs: Recent Labs    10/13/19 2125 10/14/19 0529 10/14/19 0825 10/15/19 0330  HGB  --  6.1*  --  9.6*  HCT  --  20.3*  --  28.9*  PLT  --  692*  --  625*  APTT 38*  --  87* 116*  HEPARINUNFRC 1.06*  --  1.20* 0.98*  CREATININE  --  1.25*  --   --     Assessment: 68yo female now supratherapeutic on heparin after one PTT at goal; no gtt issues or signs of bleeding per RN.  Goal of Therapy:  aPTT 66-102 seconds   Plan:  Will decrease heparin gtt by 1 unit/kg/hr to 1400 units/hr and check PTT in 6-8 hours.    Wynona Neat, PharmD, BCPS  10/15/2019,6:43 AM

## 2019-10-15 NOTE — Anesthesia Procedure Notes (Signed)
Procedure Name: LMA Insertion Date/Time: 10/15/2019 8:46 AM Performed by: Destini Cambre T, CRNA Pre-anesthesia Checklist: Patient identified, Emergency Drugs available, Suction available and Patient being monitored Patient Re-evaluated:Patient Re-evaluated prior to induction Oxygen Delivery Method: Circle system utilized Preoxygenation: Pre-oxygenation with 100% oxygen Induction Type: IV induction LMA: LMA inserted LMA Size: 5.0 Number of attempts: 1 Airway Equipment and Method: Patient positioned with wedge pillow Placement Confirmation: positive ETCO2 and breath sounds checked- equal and bilateral Tube secured with: Tape Dental Injury: Teeth and Oropharynx as per pre-operative assessment

## 2019-10-15 NOTE — Op Note (Signed)
    Patient name: Lauren [name] MRN: [id number] DOB: 1951/03/17 Sex: female  10/15/2019 Pre-operative Diagnosis: Open right groin wound Post-operative diagnosis:  Same Surgeon:  Annamarie Major Assistants: None Procedure:   #1: Debridement of skin and soft tissue and right groin incision   #2: Wound VAC change Anesthesia: General Blood Loss: 50 cc Specimens: None  Findings: Small patchy areas of fat necrosis were debrided.  Wound VAC was placed.  Wound measurements were 20 x 10 x 5 cm  Indications: The patient is undergoing groin debridement with sartorius flap to cover the artery.  She is here today for a wound VAC change  Procedure:  The patient was identified in the holding area and taken to [place] 11  The patient was then placed supine on the table. general anesthesia was administered.  The patient was prepped and draped in the usual sterile fashion.  A time out was called and antibiotics were administered.  After removing the wound VAC, the groin was copiously irrigated.  The artery was covered with muscle.  There was a palpable pulse under the muscle flap.  There were several areas of superficial fat necrosis on the superior and inferior aspects of the wound as well as the lateral edges.  These were sharply debrided with a 10 blade back to healthy tissue.  The wound was then irrigated.  A white sponge was placed over the sartorius flap and a black sponge was placed and secured with the tape and connected to the suction canister.  I had a good seal on the wound VAC.  There were no immediate complications.   Disposition: To PACU stable   V. Annamarie Major, M.D., [facility] [place] Office: [phone] Pager:  [phone]

## 2019-10-15 NOTE — Plan of Care (Signed)
  Problem: Clinical Measurements: Goal: Ability to maintain clinical measurements within normal limits will improve Outcome: Progressing Goal: Will remain free from infection Outcome: Progressing Goal: Diagnostic test results will improve Outcome: Progressing Goal: Respiratory complications will improve Outcome: Progressing Goal: Cardiovascular complication will be avoided Outcome: Progressing   Problem: Activity: Goal: Risk for activity intolerance will decrease Outcome: Progressing   Problem: Coping: Goal: Level of anxiety will decrease Outcome: Progressing   Problem: Elimination: Goal: Will not experience complications related to urinary retention Outcome: Progressing   Problem: Pain Managment: Goal: General experience of comfort will improve Outcome: Progressing   Problem: Skin Integrity: Goal: Risk for impaired skin integrity will decrease Outcome: Progressing

## 2019-10-16 ENCOUNTER — Encounter (HOSPITAL_COMMUNITY): Payer: Self-pay | Admitting: Surgery

## 2019-10-16 LAB — GLUCOSE, CAPILLARY
Glucose-Capillary: 108 mg/dL — ABNORMAL HIGH (ref 70–99)
Glucose-Capillary: 254 mg/dL — ABNORMAL HIGH (ref 70–99)
Glucose-Capillary: 196 mg/dL — ABNORMAL HIGH (ref 70–99)
Glucose-Capillary: 93 mg/dL (ref 70–99)

## 2019-10-16 LAB — APTT
aPTT: 33 seconds (ref 24–36)
aPTT: 56 seconds — ABNORMAL HIGH (ref 24–36)

## 2019-10-16 LAB — CBC
HCT: 26.6 % — ABNORMAL LOW (ref 36.0–46.0)
Hemoglobin: 8.2 g/dL — ABNORMAL LOW (ref 12.0–15.0)
MCH: 28.1 pg (ref 26.0–34.0)
MCHC: 30.8 g/dL (ref 30.0–36.0)
MCV: 91.1 fL (ref 80.0–100.0)
Platelets: 601 10*3/uL — ABNORMAL HIGH (ref 150–400)
RBC: 2.92 MIL/uL — ABNORMAL LOW (ref 3.87–5.11)
RDW: 16.5 % — ABNORMAL HIGH (ref 11.5–15.5)
WBC: 18 10*3/uL — ABNORMAL HIGH (ref 4.0–10.5)
nRBC: 0.1 % (ref 0.0–0.2)

## 2019-10-16 LAB — HEPARIN LEVEL (UNFRACTIONATED): Heparin Unfractionated: 0.29 IU/mL — ABNORMAL LOW (ref 0.30–0.70)

## 2019-10-16 MED ORDER — SODIUM CHLORIDE 0.9% FLUSH: 10.0000 mL | INTRAVENOUS | Status: DC | PRN

## 2019-10-16 NOTE — Progress Notes (Signed)
Subjective  -   No complaints this morning   Physical Exam:  Right groin wound VAC with good seal       Assessment/Plan:    Continue IV heparin We will plan at least 1 more dressing change under general anesthesia this week, likely Tuesday  [place]  10/16/2019 10:20 AM --  Vitals:   10/16/19 0506 10/16/19 0816  BP:  (!) 106/57  Pulse: 83 87  Resp: 19   Temp: 99 F (37.2 C) 98.4 F (36.9 C)  SpO2: 93% 95%    Intake/Output Summary (Last 24 hours) at 10/16/2019 1020 Last data filed at 10/16/2019 0853 Gross per 24 hour  Intake 821.97 ml  Output 1150 ml  Net -328.03 ml     Laboratory CBC    Component Value Date/Time   WBC 18.0 (H) 10/16/2019 0337   HGB 8.2 (L) 10/16/2019 0337   HGB 11.2 01/28/2018 1035   HGB 12.0 09/29/2011 1016   HCT 26.6 (L) 10/16/2019 0337   HCT 34.4 01/28/2018 1035   HCT 35.3 09/29/2011 1016   PLT 601 (H) 10/16/2019 0337   PLT 384 (H) 01/28/2018 1035    BMET    Component Value Date/Time   NA 135 10/14/2019 0529   NA 142 01/28/2018 1035   K 3.4 (L) 10/14/2019 0529   CL 97 (L) 10/14/2019 0529   CO2 26 10/14/2019 0529   GLUCOSE 198 (H) 10/14/2019 0529   BUN 17 10/14/2019 0529   BUN 19 01/28/2018 1035   BUN 14.7 07/18/2014 1142   CREATININE 1.25 (H) 10/14/2019 0529   CREATININE 1.0 07/18/2014 1142   CALCIUM 8.0 (L) 10/14/2019 0529   GFRNONAA 44 (L) 10/14/2019 0529   GFRAA 51 (L) 10/14/2019 0529    COAG Lab Results  Component Value Date   INR 1.2 09/29/2019   INR 1.1 09/15/2019   INR 1.14 08/22/2017   No results found for: PTT  Antibiotics Anti-infectives (From admission, onward)   Start     Dose/Rate Route Frequency Ordered Stop   10/10/19 1230  vancomycin (VANCOCIN) IVPB 1000 mg/200 mL premix  Status:  Discontinued     1,000 mg 200 mL/hr over 60 Minutes Intravenous Every 24 hours 10/09/19 1141 10/14/19 1200   10/09/19 1400  piperacillin-tazobactam (ZOSYN) IVPB 3.375 g  Status:  Discontinued     3.375  g 12.5 mL/hr over 240 Minutes Intravenous Every 8 hours 10/09/19 1152 10/14/19 0816   10/09/19 1230  vancomycin (VANCOCIN) 2,000 mg in sodium chloride 0.9 % 500 mL IVPB     2,000 mg 250 mL/hr over 120 Minutes Intravenous  Once 10/09/19 1141 10/10/19 0853   10/09/19 1200  piperacillin-tazobactam (ZOSYN) IVPB 3.375 g  Status:  Discontinued     3.375 g 12.5 mL/hr over 240 Minutes Intravenous Every 6 hours 10/09/19 1127 10/09/19 1152   10/07/19 0800  ciprofloxacin (CIPRO) tablet 500 mg  Status:  Discontinued     500 mg Oral 2 times daily 10/07/19 0741 10/09/19 1152   09/29/19 1345  ceFAZolin (ANCEF) IVPB 2g/100 mL premix     2 g 200 mL/hr over 30 Minutes Intravenous Every 8 hours 09/29/19 1342 09/30/19 0544   09/29/19 0633  ceFAZolin (ANCEF) 2-4 GM/100ML-% IVPB    Note to Pharmacy: Tamsen Snider   : cabinet override      09/29/19 0633 09/29/19 0727   09/29/19 0615  ceFAZolin (ANCEF) IVPB 2g/100 mL premix     2 g 200 mL/hr over 30 Minutes Intravenous 30  min pre-op 09/29/19 0615 09/29/19 0727       V. Leia Alf, M.D., [facility] [place] Office: [phone] Pager:  [phone]

## 2019-10-16 NOTE — Progress Notes (Signed)
ANTICOAGULATION CONSULT NOTE - f/u Consult  Pharmacy Consult for Heparin  Indication: h/o DVT + wound necrosis in the right groin.  Allergies  Allergen Reactions  . Ambien [Zolpidem Tartrate] Other (See Comments)    Per the pt, "it makes me go crazy"  . Sulfa Antibiotics Rash  . Ativan [Lorazepam] Other (See Comments)    Patient has the opposite effect when taking medication   . Elavil [Amitriptyline Hcl] Other (See Comments)    Gastritis  . Prozac [Fluoxetine Hcl] Anxiety and Other (See Comments)    irritability     Patient Measurements: Height: 5' 7.48" (171.4 cm) Weight: 205 lb 4 oz (93.1 kg) IBW/kg (Calculated) : 62.7 Heparin Dosing Weight:  83 kg  Vital Signs: Temp: 98.1 F (36.7 C) (10/18 1051) Temp Source: Oral (10/18 1051) BP: 105/72 (10/18 1051) Pulse Rate: 84 (10/18 1051)  Labs: Recent Labs    10/14/19 0529 10/14/19 0825 10/15/19 0330  10/15/19 2311 10/16/19 0337 10/16/19 1036  HGB 6.1*  --  9.6*  --   --  8.2*  --   HCT 20.3*  --  28.9*  --   --  26.6*  --   PLT 692*  --  625*  --   --  601*  --   APTT  --  87* 116*   < > 68* 33 56*  HEPARINUNFRC  --  1.20* 0.98*  --   --  0.29*  --   CREATININE 1.25*  --   --   --   --   --   --    < > = values in this interval not displayed.    Estimated Creatinine Clearance: 50.2 mL/min (A) (by C-G formula based on SCr of 1.25 mg/dL (H)).   Medical History: Past Medical History:  Diagnosis Date  . Adenomatous colon polyp   . Anemia   . Anxiety   . AVM (arteriovenous malformation)    cecum  . CAD (coronary artery disease)   . Carotid artery occlusion   . Cataract    bil cateracts removed  . CHF (congestive heart failure) ([place])   . COPD (chronic obstructive pulmonary disease) ([place])   . Cough   . DDD (degenerative disc disease), lumbar   . Depression   . Diabetes mellitus    Type II  . DVT (deep venous thrombosis) ([place])    RLE 05/03/19  . Elevated LFTs   . Endometrial ca [facility])    endometrial ca dx  4/11  . Foot drop   . GERD (gastroesophageal reflux disease)   . Gout 2015  . Hernia of flank   . Hydronephrosis   . Hyperlipidemia   . Hypertension   . Insomnia   . Neuropathy   . OAB (overactive bladder)   . Osteoarthritis   . Peripheral neuropathy    following chemotherapy  . Peripheral vascular disease ([place])   . Pneumonia 07/2017  . Psoriasis   . Pulmonary nodule    since 2016  . Radiation 05/01/2011-06/11/11   5600 cGy 28 fxs/periaortic  . Stroke [facility]) 2015   tia- Patient denies  . Thyroid nodule   . TIA (transient ischemic attack) 2015  . 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 Uterine cancer ([place])   . Ventral hernia    postoperative since 2009    Assessment: Eliquis PTA for femoral DVT (on PTA). LD Eliquis 10/14 PM.  Transition to IV heparin post-op 10/15. HL's remain elevated  due to Eliquis.  APTT over night subtherapeutic at 33, thought to be from infusion issues so continued on 1400 units/hr. Confirmatory aPTT (drawn slightly early) at 1030 is subtherapeutic at 56. Of note, her HL this am was 0.29 so it appears the HL aPTT are correlated now and we can only check HL.   H&H had decreased since yesterday and is now 8.2/262.6, plts are above the normal limit at 601.   Her aPTT was supratherapeutic on previous rate of 1500 unit/hour, so will only slightly increase her rate now    Goal of Therapy:  Heparin level 0.3-0.7 units/ml  Monitor platelets by anticoagulation protocol: Yes   Plan:  Increase IV heparin to 1450 units/hour   HOLD Eliquis (LD 10/14 PM) Daily HL and CBC Follow up re-start Eliquis  Monitor signs of bleeding    Thank you,   Eddie Candle, PharmD PGY-1 Pharmacy Resident   Please check amion for clinical pharmacist contact number

## 2019-10-16 NOTE — Progress Notes (Addendum)
ANTICOAGULATION CONSULT NOTE - Follow Up Consult  Pharmacy Consult for heparin Indication: h/o DVT  Labs: Recent Labs    10/13/19 2125 10/14/19 0529 10/14/19 0825 10/15/19 0330 10/15/19 1215 10/15/19 2311  HGB  --  6.1*  --  9.6*  --   --   HCT  --  20.3*  --  28.9*  --   --   PLT  --  692*  --  625*  --   --   APTT 38*  --  87* 116* 35 68*  HEPARINUNFRC 1.06*  --  1.20* 0.98*  --   --   CREATININE  --  1.25*  --   --   --   --     Assessment/Plan:  68yo female therapeutic on heparin after resumed. Will continue gtt at current rate and confirm stable with am labs.   Wynona Neat, PharmD, BCPS  10/16/2019,12:06 AM   Addendum: AM PTT has dropped to 33; no gtt issues or interruptions per RN.  Will increase heparin gtt by 2 units/kg/hr to 1600 units/hr and check PTT in 6 hours.    VB 5:04 AM    Addendum: When RN went to pt's room to make rate adjustment, she noticed that the pt's bed was wet >> believes the IV has not been infusing properly.  Will continue heparin gtt at 1400 units/hr and recheck PTT.  VB 5:14 AM

## 2019-10-17 LAB — GLUCOSE, CAPILLARY
Glucose-Capillary: 99 mg/dL (ref 70–99)
Glucose-Capillary: 99 mg/dL (ref 70–99)
Glucose-Capillary: 212 mg/dL — ABNORMAL HIGH (ref 70–99)
Glucose-Capillary: 218 mg/dL — ABNORMAL HIGH (ref 70–99)

## 2019-10-17 LAB — CBC
HCT: 28.5 % — ABNORMAL LOW (ref 36.0–46.0)
Hemoglobin: 8.6 g/dL — ABNORMAL LOW (ref 12.0–15.0)
MCH: 27.7 pg (ref 26.0–34.0)
MCHC: 30.2 g/dL (ref 30.0–36.0)
MCV: 91.9 fL (ref 80.0–100.0)
Platelets: 625 10*3/uL — ABNORMAL HIGH (ref 150–400)
RBC: 3.1 MIL/uL — ABNORMAL LOW (ref 3.87–5.11)
RDW: 16.7 % — ABNORMAL HIGH (ref 11.5–15.5)
WBC: 15.2 10*3/uL — ABNORMAL HIGH (ref 4.0–10.5)
nRBC: 0 % (ref 0.0–0.2)

## 2019-10-17 LAB — BASIC METABOLIC PANEL
Anion gap: 13 (ref 5–15)
BUN: 11 mg/dL (ref 8–23)
CO2: 26 mmol/L (ref 22–32)
Calcium: 8.4 mg/dL — ABNORMAL LOW (ref 8.9–10.3)
Chloride: 99 mmol/L (ref 98–111)
Creatinine, Ser: 1.2 mg/dL — ABNORMAL HIGH (ref 0.44–1.00)
GFR calc Af Amer: 53 mL/min — ABNORMAL LOW (ref >60)
GFR calc non Af Amer: 46 mL/min — ABNORMAL LOW (ref >60)
Glucose, Bld: 105 mg/dL — ABNORMAL HIGH (ref 70–99)
Potassium: 3.8 mmol/L (ref 3.5–5.1)
Sodium: 138 mmol/L (ref 135–145)

## 2019-10-17 LAB — HEPARIN LEVEL (UNFRACTIONATED)
Heparin Unfractionated: 0.9 IU/mL — ABNORMAL HIGH (ref 0.30–0.70)
Heparin Unfractionated: 0.55 IU/mL (ref 0.30–0.70)

## 2019-10-17 LAB — APTT
aPTT: 108 seconds — ABNORMAL HIGH (ref 24–36)
aPTT: 65 seconds — ABNORMAL HIGH (ref 24–36)

## 2019-10-17 MED ORDER — CEFAZOLIN SODIUM-DEXTROSE 1-4 GM/50ML-% IV SOLN
1.0000 g | INTRAVENOUS | Status: AC
  Filled 2019-10-17: qty 50

## 2019-10-17 NOTE — Care Management Important Message (Signed)
Important Message  Patient Details  Name: Lauren [name] MRN: [id number] Date of Birth: [birth year]   Medicare Important Message Given:  Yes     [name] 10/17/2019, 1:27 PM

## 2019-10-17 NOTE — Progress Notes (Signed)
ANTICOAGULATION CONSULT NOTE - Follow Up Consult  Pharmacy Consult for heparin Indication: h/o DVT  Labs: Recent Labs    10/15/19 0330  10/16/19 0337 10/16/19 1036 10/17/19 0437  HGB 9.6*  --  8.2*  --  8.6*  HCT 28.9*  --  26.6*  --  28.5*  PLT 625*  --  601*  --  625*  APTT 116*   < > 33 56* 108*  HEPARINUNFRC 0.98*  --  0.29*  --  0.90*   < > = values in this interval not displayed.    Assessment: 68yo female supratherapeutic on heparin after rate change; no gtt issues or signs of bleeding per RN.  Goal of Therapy:  Heparin level 0.3-0.7 units/ml aPTT 66-102 seconds   Plan:  Will decrease heparin gtt by 1 unit/kg/hr to 1350 units/hr and check labs in 6 hours.    Lauren [name], PharmD, BCPS  10/17/2019,6:22 AM

## 2019-10-17 NOTE — Progress Notes (Addendum)
    Progress Note    10/17/2019 2:41 PM 2 Days Post-Op  Subjective: Only complains of right groin pain  Vitals:   10/17/19 0900 10/17/19 0914  BP: (!) 118/51   Pulse:    Resp: 11   Temp:    SpO2:  92%    Physical Exam: Awake alert oriented Nonlabored respirations Right groin wound VAC to suction Right first amputation site healing well appears well perfused.  CBC    Component Value Date/Time   WBC 15.2 (H) 10/17/2019 0437   RBC 3.10 (L) 10/17/2019 0437   HGB 8.6 (L) 10/17/2019 0437   HGB 11.2 01/28/2018 1035   HGB 12.0 09/29/2011 1016   HCT 28.5 (L) 10/17/2019 0437   HCT 34.4 01/28/2018 1035   HCT 35.3 09/29/2011 1016   PLT 625 (H) 10/17/2019 0437   PLT 384 (H) 01/28/2018 1035   MCV 91.9 10/17/2019 0437   MCV 88 01/28/2018 1035   MCV 88.0 09/29/2011 1016   MCH 27.7 10/17/2019 0437   MCHC 30.2 10/17/2019 0437   RDW 16.7 (H) 10/17/2019 0437   RDW 17.6 (H) 01/28/2018 1035   RDW 14.8 (H) 09/29/2011 1016   LYMPHSABS 1.6 10/14/2019 0529   LYMPHSABS 0.6 (L) 09/29/2011 1016   MONOABS 1.7 (H) 10/14/2019 0529   MONOABS 0.8 09/29/2011 1016   EOSABS 0.1 10/14/2019 0529   EOSABS 0.3 09/29/2011 1016   BASOSABS 0.1 10/14/2019 0529   BASOSABS 0.1 09/29/2011 1016    BMET    Component Value Date/Time   NA 138 10/17/2019 0437   NA 142 01/28/2018 1035   K 3.8 10/17/2019 0437   CL 99 10/17/2019 0437   CO2 26 10/17/2019 0437   GLUCOSE 105 (H) 10/17/2019 0437   BUN 11 10/17/2019 0437   BUN 19 01/28/2018 1035   BUN 14.7 07/18/2014 1142   CREATININE 1.20 (H) 10/17/2019 0437   CREATININE 1.0 07/18/2014 1142   CALCIUM 8.4 (L) 10/17/2019 0437   GFRNONAA 46 (L) 10/17/2019 0437   GFRAA 53 (L) 10/17/2019 0437    INR    Component Value Date/Time   INR 1.2 09/29/2019 0628     Intake/Output Summary (Last 24 hours) at 10/17/2019 1441 Last data filed at 10/17/2019 1200 Gross per 24 hour  Intake 1044.72 ml  Output 2625 ml  Net -1580.28 ml     Assessment:  68  y.o. female is s/p right common femoral endarterectomy and multiple groin debridements with sartorius muscle flap on the right.  Right amputation site healing well.  Wound VAC is to suction.  Plan: NPO past MN for  wound vac change  and wound exploration tomorrow right groin.   Rikki Smestad C. Donzetta Matters, MD [facility] [place] Office: [phone] Pager: [phone]  10/17/2019 2:41 PM

## 2019-10-17 NOTE — Consult Note (Addendum)
WOC follow-up: Vascular team plans for Vac dressing change tomorrow in the OR, according to progress notes.  Please re-consult [facility] team if dressing change assistance is needed later this week.  Please re-consult if further assistance is needed.  Thank-you,  Julien Girt MSN, [place], [place], [place], [place]

## 2019-10-17 NOTE — Progress Notes (Signed)
ANTICOAGULATION CONSULT NOTE - Follow Up Consult  Pharmacy Consult for heparin Indication: h/o DVT  Allergies  Allergen Reactions  . Ambien [Zolpidem Tartrate] Other (See Comments)    Per the pt, "it makes me go crazy"  . Sulfa Antibiotics Rash  . Ativan [Lorazepam] Other (See Comments)    Patient has the opposite effect when taking medication   . Elavil [Amitriptyline Hcl] Other (See Comments)    Gastritis  . Prozac [Fluoxetine Hcl] Anxiety and Other (See Comments)    irritability     Patient Measurements: Height: 5' 7.48" (171.4 cm) Weight: 205 lb 4 oz (93.1 kg) IBW/kg (Calculated) : 62.7 Heparin Dosing Weight: 83 kg  Vital Signs: Temp: 98.4 F (36.9 C) (10/19 0743) Temp Source: Oral (10/19 0743) BP: 111/58 (10/19 1520) Pulse Rate: 83 (10/19 1520)  Labs: Recent Labs    10/15/19 0330  10/16/19 0337 10/16/19 1036 10/17/19 0437 10/17/19 1448 10/17/19 1459  HGB 9.6*  --  8.2*  --  8.6*  --   --   HCT 28.9*  --  26.6*  --  28.5*  --   --   PLT 625*  --  601*  --  625*  --   --   APTT 116*   < > 33 56* 108*  --  65*  HEPARINUNFRC 0.98*  --  0.29*  --  0.90* 0.55  --   CREATININE  --   --   --   --  1.20*  --   --    < > = values in this interval not displayed.    Estimated Creatinine Clearance: 52.3 mL/min (A) (by C-G formula based on SCr of 1.2 mg/dL (H)).    Assessment: Eliquis PTA for femoral DVT (on PTA). LD Eliquis 10/14 PM.  Transition to IV heparin post-op 10/15  H&H 8.2/26.6> 8.6/28.5, plts are above the normal limit at 625.   Her aPTT and heparin levels still not correlating exactly, would expect heparin level closer to 0.3 with current aPTT of 65. Small changes in heparin rate are causing large shifts in heparin and aPTT.  Will make small adjustment given aPTT borderline low and check another set to see if correlating better.  Goal of Therapy:  Heparin level 0.3-0.7 units/ml  APTT level 66- 102 sec Monitor platelets by anticoagulation  protocol: Yes   Plan:  Increase IV heparin to 1400 units/hour    Check anti-Xa and aPTT with AM labs HOLD Eliquis (LD 10/14 PM) Daily HL and CBC Follow up re-start Eliquis  Monitor signs of bleeding    Benetta Spar, PharmD, BCPS, [facility] Clinical Pharmacist

## 2019-10-18 ENCOUNTER — Inpatient Hospital Stay (HOSPITAL_COMMUNITY): Payer: Medicare Other | Admitting: Certified Registered Nurse Anesthetist

## 2019-10-18 ENCOUNTER — Encounter (HOSPITAL_COMMUNITY)
Admission: RE | Disposition: A | Discharge status: Hospice | Payer: Self-pay | Source: Home / Self Care | Attending: Vascular Surgery

## 2019-10-18 ENCOUNTER — Encounter (HOSPITAL_COMMUNITY): Payer: Self-pay | Admitting: Orthopedic Surgery

## 2019-10-18 HISTORY — PX: GROIN DEBRIDEMENT: SHX5159

## 2019-10-18 HISTORY — PX: APPLICATION OF WOUND VAC: SHX5189

## 2019-10-18 LAB — CBC
HCT: 28.2 % — ABNORMAL LOW (ref 36.0–46.0)
Hemoglobin: 8.9 g/dL — ABNORMAL LOW (ref 12.0–15.0)
MCH: 28.6 pg (ref 26.0–34.0)
MCHC: 31.6 g/dL (ref 30.0–36.0)
MCV: 90.7 fL (ref 80.0–100.0)
Platelets: 629 10*3/uL — ABNORMAL HIGH (ref 150–400)
RBC: 3.11 MIL/uL — ABNORMAL LOW (ref 3.87–5.11)
RDW: 16.7 % — ABNORMAL HIGH (ref 11.5–15.5)
WBC: 13.9 10*3/uL — ABNORMAL HIGH (ref 4.0–10.5)
nRBC: 0 % (ref 0.0–0.2)

## 2019-10-18 LAB — GLUCOSE, CAPILLARY
Glucose-Capillary: 129 mg/dL — ABNORMAL HIGH (ref 70–99)
Glucose-Capillary: 83 mg/dL (ref 70–99)
Glucose-Capillary: 91 mg/dL (ref 70–99)
Glucose-Capillary: 100 mg/dL — ABNORMAL HIGH (ref 70–99)
Glucose-Capillary: 97 mg/dL (ref 70–99)
Glucose-Capillary: 155 mg/dL — ABNORMAL HIGH (ref 70–99)

## 2019-10-18 LAB — BASIC METABOLIC PANEL
Anion gap: 10 (ref 5–15)
BUN: 12 mg/dL (ref 8–23)
CO2: 27 mmol/L (ref 22–32)
Calcium: 8.6 mg/dL — ABNORMAL LOW (ref 8.9–10.3)
Chloride: 99 mmol/L (ref 98–111)
Creatinine, Ser: 1.14 mg/dL — ABNORMAL HIGH (ref 0.44–1.00)
GFR calc Af Amer: 57 mL/min — ABNORMAL LOW (ref >60)
GFR calc non Af Amer: 49 mL/min — ABNORMAL LOW (ref >60)
Glucose, Bld: 146 mg/dL — ABNORMAL HIGH (ref 70–99)
Potassium: 3.9 mmol/L (ref 3.5–5.1)
Sodium: 136 mmol/L (ref 135–145)

## 2019-10-18 LAB — HEPARIN LEVEL (UNFRACTIONATED): Heparin Unfractionated: 0.92 IU/mL — ABNORMAL HIGH (ref 0.30–0.70)

## 2019-10-18 LAB — APTT: aPTT: 116 seconds — ABNORMAL HIGH (ref 24–36)

## 2019-10-18 LAB — PROTIME-INR
INR: 1.2 (ref 0.8–1.2)
Prothrombin Time: 15.5 seconds — ABNORMAL HIGH (ref 11.4–15.2)

## 2019-10-18 SURGERY — DEBRIDEMENT, INGUINAL REGION
Anesthesia: General | Site: Groin | Laterality: Right

## 2019-10-18 MED ORDER — MIDAZOLAM HCL 2 MG/2ML IJ SOLN
INTRAMUSCULAR | Status: AC
  Filled 2019-10-18: qty 2

## 2019-10-18 MED ORDER — MIDAZOLAM HCL 5 MG/5ML IJ SOLN
INTRAMUSCULAR | Status: DC | PRN
  Administered 2019-10-18: 2 mg via INTRAVENOUS

## 2019-10-18 MED ORDER — HYDROMORPHONE HCL 1 MG/ML IJ SOLN
INTRAMUSCULAR | Status: AC
  Filled 2019-10-18: qty 1

## 2019-10-18 MED ORDER — PROPOFOL 10 MG/ML IV BOLUS
INTRAVENOUS | Status: DC | PRN
  Administered 2019-10-18: 130 mg via INTRAVENOUS

## 2019-10-18 MED ORDER — LIDOCAINE 2% (20 MG/ML) 5 ML SYRINGE
INTRAMUSCULAR | Status: DC | PRN
  Administered 2019-10-18: 60 mg via INTRAVENOUS

## 2019-10-18 MED ORDER — FENTANYL CITRATE (PF) 250 MCG/5ML IJ SOLN
INTRAMUSCULAR | Status: AC
  Filled 2019-10-18: qty 5

## 2019-10-18 MED ORDER — ROCURONIUM BROMIDE 10 MG/ML (PF) SYRINGE
PREFILLED_SYRINGE | INTRAVENOUS | Status: AC
  Filled 2019-10-18: qty 10

## 2019-10-18 MED ORDER — HYDROMORPHONE HCL 1 MG/ML IJ SOLN
0.2500 mg | INTRAMUSCULAR | Status: DC | PRN
  Administered 2019-10-18 (×4): 0.5 mg via INTRAVENOUS

## 2019-10-18 MED ORDER — 0.9 % SODIUM CHLORIDE (POUR BTL) OPTIME
TOPICAL | Status: DC | PRN
  Administered 2019-10-18: 15:00:00 1000 mL

## 2019-10-18 MED ORDER — PHENYLEPHRINE 40 MCG/ML (10ML) SYRINGE FOR IV PUSH (FOR BLOOD PRESSURE SUPPORT)
PREFILLED_SYRINGE | INTRAVENOUS | Status: DC | PRN
  Administered 2019-10-18: 40 ug via INTRAVENOUS

## 2019-10-18 MED ORDER — DEXAMETHASONE SODIUM PHOSPHATE 10 MG/ML IJ SOLN
INTRAMUSCULAR | Status: AC
  Filled 2019-10-18: qty 1

## 2019-10-18 MED ORDER — EPHEDRINE SULFATE-NACL 50-0.9 MG/10ML-% IV SOSY
PREFILLED_SYRINGE | INTRAVENOUS | Status: DC | PRN
  Administered 2019-10-18: 10 mg via INTRAVENOUS

## 2019-10-18 MED ORDER — PROPOFOL 10 MG/ML IV BOLUS
INTRAVENOUS | Status: AC
  Filled 2019-10-18: qty 20

## 2019-10-18 MED ORDER — LIDOCAINE 2% (20 MG/ML) 5 ML SYRINGE
INTRAMUSCULAR | Status: AC
  Filled 2019-10-18: qty 5

## 2019-10-18 MED ORDER — CEFAZOLIN SODIUM-DEXTROSE 2-3 GM-%(50ML) IV SOLR
INTRAVENOUS | Status: DC | PRN
  Administered 2019-10-18: 2 g via INTRAVENOUS

## 2019-10-18 MED ORDER — FENTANYL CITRATE (PF) 100 MCG/2ML IJ SOLN
INTRAMUSCULAR | Status: DC | PRN
  Administered 2019-10-18 (×5): 50 ug via INTRAVENOUS

## 2019-10-18 MED ORDER — LACTATED RINGERS IV SOLN
INTRAVENOUS | Status: DC
  Administered 2019-10-18: 13:00:00 via INTRAVENOUS

## 2019-10-18 MED ORDER — ONDANSETRON HCL 4 MG/2ML IJ SOLN
INTRAMUSCULAR | Status: AC
  Filled 2019-10-18: qty 2

## 2019-10-18 SURGICAL SUPPLY — 27 items
CANISTER SUCT 3000ML PPV (MISCELLANEOUS) ×4 IMPLANT
COVER SURGICAL LIGHT HANDLE (MISCELLANEOUS) ×3 IMPLANT
DRSG VAC ATS LRG SENSATRAC (GAUZE/BANDAGES/DRESSINGS) IMPLANT
DRSG VAC ATS MED SENSATRAC (GAUZE/BANDAGES/DRESSINGS) ×2 IMPLANT
ELECT REM PT RETURN 9FT ADLT (ELECTROSURGICAL) ×3
ELECTRODE REM PT RTRN 9FT ADLT (ELECTROSURGICAL) ×1 IMPLANT
GLOVE BIO SURGEON STRL SZ 6.5 (GLOVE) ×1 IMPLANT
GLOVE BIO SURGEON STRL SZ7.5 (GLOVE) ×3 IMPLANT
GLOVE BIO SURGEONS STRL SZ 6.5 (GLOVE) ×1
GLOVE BIOGEL PI IND STRL 7.0 (GLOVE) IMPLANT
GLOVE BIOGEL PI INDICATOR 7.0 (GLOVE) ×2
GOWN STRL REUS W/ TWL LRG LVL3 (GOWN DISPOSABLE) ×3 IMPLANT
GOWN STRL REUS W/ TWL XL LVL3 (GOWN DISPOSABLE) ×1 IMPLANT
GOWN STRL REUS W/TWL LRG LVL3 (GOWN DISPOSABLE) ×3
GOWN STRL REUS W/TWL XL LVL3 (GOWN DISPOSABLE) ×3
HANDPIECE INTERPULSE COAX TIP (DISPOSABLE)
KIT BASIN OR (CUSTOM PROCEDURE TRAY) ×3 IMPLANT
KIT TURNOVER KIT B (KITS) ×3 IMPLANT
NS IRRIG 1000ML POUR BTL (IV SOLUTION) ×3 IMPLANT
PACK GENERAL/GYN (CUSTOM PROCEDURE TRAY) ×3 IMPLANT
PACK UNIVERSAL I (CUSTOM PROCEDURE TRAY) ×2 IMPLANT
PAD ARMBOARD 7.5X6 YLW CONV (MISCELLANEOUS) ×6 IMPLANT
PAD NEG PRESSURE SENSATRAC (MISCELLANEOUS) ×3 IMPLANT
SET HNDPC FAN SPRY TIP SCT (DISPOSABLE) IMPLANT
SPONGE LAP 18X18 X RAY DECT (DISPOSABLE) ×2 IMPLANT
TOWEL GREEN STERILE (TOWEL DISPOSABLE) ×3 IMPLANT
WATER STERILE IRR 1000ML POUR (IV SOLUTION) IMPLANT

## 2019-10-18 NOTE — Anesthesia Procedure Notes (Signed)
Procedure Name: LMA Insertion Date/Time: 10/18/2019 2:13 PM Performed by: Lowella Dell, CRNA Pre-anesthesia Checklist: Patient identified, Emergency Drugs available, Suction available and Patient being monitored Patient Re-evaluated:Patient Re-evaluated prior to induction Oxygen Delivery Method: Circle System Utilized Preoxygenation: Pre-oxygenation with 100% oxygen Induction Type: IV induction Ventilation: Mask ventilation without difficulty LMA: LMA inserted LMA Size: 4.0 Number of attempts: 1 Placement Confirmation: positive ETCO2 Tube secured with: Tape Dental Injury: Teeth and Oropharynx as per pre-operative assessment

## 2019-10-18 NOTE — Op Note (Addendum)
    Patient name: Lauren [name] MRN: [id number] DOB: 29-Sep-1951 Sex: female  10/18/2019 Pre-operative Diagnosis: Right groin postoperative wound Post-operative diagnosis:  Same Surgeon:  Erlene Quan C. Donzetta Matters, MD Assistant: Arlee Muslim, PA Procedure Performed: 1.  Sharp, excisional debridement skin and subcutaneous tissue of the right groin wound to 22 x 10 x 5 cm 2.  Wound VAC application  Indications: 68 year old female with history of right common femoral endarterectomy.  She now has large wound that has been read on multiple occasions.  She is indicated for wound VAC change possible debridement.  Findings: Wound edges laterally superiorly and on the medial inferior aspect were debrided back to healthy-appearing tissue.  Muscle beds all appear healthy including the muscle flap.  There is good signal below the muscle flap to suggest arterial patency although the artery is covered.  There is minimal granulation tissue in the wound bed.   Procedure:  The patient was identified in the holding area and taken to the operating room where general anesthesia was induced.  She was sterilely prepped and draped in the right groin antibiotics were minister timeout called.  We began by evaluating the wound.  We then performed sharp excisional debridement with 10 blade of skin and subcutaneous tissue in the aforementioned areas.  Hemostasis was obtained the wound was thoroughly irrigated and a new wound VAC was placed to suction.  She was awakened anesthesia having tolerated procedure without immediate complication.  EBL: 50 cc   Mariateresa Batra C. Donzetta Matters, MD [facility] [place] Office: [phone] Pager: [phone]

## 2019-10-18 NOTE — Progress Notes (Signed)
Physical Therapy Treatment Patient Details Name: Lauren [name] MRN: [id number] DOB: 06/04/1951 Today's Date: 10/18/2019    History of Present Illness Pt is a 68 y.o. female admitted 09/29/19 with worsening pain R foot and great toe. S/p bilateral SFA endarterectomies 10/1. Now s/p RCFEA now with wound debridement and great toe amp 10/13. S/p R groin I&D and sartoirus muscle flap with wound vac placed 10/15. To OR for additional groin I&D on 10/20. PMH includes HTN, HLD, COPD, DM, CAD.   PT Comments    Pt progressing with mobility. Today, pt requiring mod-maxA for bed mobility, able to perform prolonged sitting EOB progressing to min guard-supervision for sitting balance. Pt with increased distraction regarding groin pain and awaiting additional debridement this afternoon. Requires frequent redirection to task and encouragement for mobility. Continue to recommend SNF-level therapies. Recommend OOB transfers with maximove lift for nursing staff.   Follow Up Recommendations  SNF;Supervision for mobility/OOB     Equipment Recommendations  (TBD next venue)    Recommendations for Other Services       Precautions / Restrictions Precautions Precautions: Fall;Other (comment) Precaution Comments: R groin wound vac Restrictions Weight Bearing Restrictions: Yes Other Position/Activity Restrictions: WB through R heel, L orthotic and shoe for OOB    Mobility  Bed Mobility Overal bed mobility: Needs Assistance Bed Mobility: Supine to Sit;Rolling Rolling: Mod assist   Supine to sit: Max assist;HOB elevated Sit to supine: Max assist   General bed mobility comments: MaxA to assist RLE to EOB and RUE support for trunk elevation; maxA for return to supine due to groin pain. Pt able to roll R/L well for pad change with modA; assisting well with BUE rail support when cued  Transfers                 General transfer comment: Return to supine due to pain/fatigue  Ambulation/Gait                  Stairs             Wheelchair Mobility    Modified Rankin (Stroke Patients Only)       Balance Overall balance assessment: Needs assistance Sitting-balance support: Bilateral upper extremity supported;Feet supported Sitting balance-Leahy Scale: Fair Sitting balance - Comments: Initial modA to maintain seated balance at EOB, progressing to supervision, reliant on UE support Postural control: Posterior lean;Left lateral lean                                  Cognition Arousal/Alertness: Awake/alert Behavior During Therapy: Anxious Overall Cognitive Status: No family/caregiver present to determine baseline cognitive functioning Area of Impairment: Attention;Following commands;Safety/judgement;Problem solving                   Current Attention Level: Sustained   Following Commands: Follows one step commands with increased time Safety/Judgement: Decreased awareness of deficits;Decreased awareness of safety   Problem Solving: Requires verbal cues General Comments: Pt distracted by pain requiring frequent redirection. Often saying, "I can't do it... I don't want to..." but then clarifying "It's just because I'm lazy." Able to be redirected with frequent, multimodal cues for task      Exercises General Exercises - Lower Extremity Long Arc Quad: AROM;Both;10 reps;Seated(limited range)    General Comments        Pertinent Vitals/Pain Pain Assessment: Faces Faces Pain Scale: Hurts whole lot Pain Location: R groin/abdomen Pain Descriptors / Indicators:  Grimacing;Guarding;Moaning;Crying Pain Intervention(s): Monitored during session;Repositioned    Home Living                      Prior Function            PT Goals (current goals can now be found in the care plan section) Acute Rehab PT Goals Patient Stated Goal: I want to go home with my husband PT Goal Formulation: With patient Time For Goal Achievement:  11/01/19 Potential to Achieve Goals: Fair Progress towards PT goals: Progressing toward goals    Frequency    Min 2X/week      PT Plan Current plan remains appropriate    Co-evaluation              AM-PAC PT "6 Clicks" Mobility   Outcome Measure  Help needed turning from your back to your side while in a flat bed without using bedrails?: A Lot Help needed moving from lying on your back to sitting on the side of a flat bed without using bedrails?: A Lot Help needed moving to and from a bed to a chair (including a wheelchair)?: Total Help needed standing up from a chair using your arms (e.g., wheelchair or bedside chair)?: Total Help needed to walk in hospital room?: Total Help needed climbing 3-5 steps with a railing? : Total 6 Click Score: 8    End of Session   Activity Tolerance: Patient tolerated treatment well;Patient limited by pain Patient left: in bed;with call bell/phone within reach;with bed alarm set Nurse Communication: Mobility status;Need for lift equipment PT Visit Diagnosis: Muscle weakness (generalized) (M62.81);Unsteadiness on feet (R26.81);Pain;Other abnormalities of gait and mobility (R26.89)     Time: [id number] PT Time Calculation (min) (ACUTE ONLY): 26 min  Charges:  $Therapeutic Activity: 23-37 mins                    Mabeline Caras, PT, DPT Acute Rehabilitation Services  Pager [phone] Office [place] 10/18/2019, 1:21 PM

## 2019-10-18 NOTE — Progress Notes (Signed)
ANTICOAGULATION CONSULT NOTE - Follow Up Consult  Pharmacy Consult for heparin Indication: h/o DVT  Allergies  Allergen Reactions  . Ambien [Zolpidem Tartrate] Other (See Comments)    Per the pt, "it makes me go crazy"  . Sulfa Antibiotics Rash  . Ativan [Lorazepam] Other (See Comments)    Patient has the opposite effect when taking medication   . Elavil [Amitriptyline Hcl] Other (See Comments)    Gastritis  . Prozac [Fluoxetine Hcl] Anxiety and Other (See Comments)    irritability     Patient Measurements: Height: 5' 7.48" (171.4 cm) Weight: 205 lb 4 oz (93.1 kg) IBW/kg (Calculated) : 62.7 Heparin Dosing Weight: 83 kg  Vital Signs: Temp: 98.6 F (37 C) (10/20 0620) Temp Source: Oral (10/20 0620) BP: 130/45 (10/20 0620) Pulse Rate: 83 (10/20 0620)  Labs: Recent Labs    10/16/19 0337  10/17/19 0437 10/17/19 1448 10/17/19 1459 10/18/19 0332  HGB 8.2*  --  8.6*  --   --  8.9*  HCT 26.6*  --  28.5*  --   --  28.2*  PLT 601*  --  625*  --   --  629*  APTT 33   < > 108*  --  65* 116*  LABPROT  --   --   --   --   --  15.5*  INR  --   --   --   --   --  1.2  HEPARINUNFRC 0.29*  --  0.90* 0.55  --  0.92*  CREATININE  --   --  1.20*  --   --  1.14*   < > = values in this interval not displayed.    Estimated Creatinine Clearance: 55.1 mL/min (A) (by C-G formula based on SCr of 1.14 mg/dL (H)).    Assessment: Eliquis PTA for femoral DVT (on PTA). LD Eliquis 10/14 PM.  Transition to IV heparin post-op 10/15 aPTT 116 sec and HL 0.92 this am.  Will use heparin levels at this point to guide therapy  Goal of Therapy:  Heparin level 0.3-0.7 units/ml  Monitor platelets by anticoagulation protocol: Yes   Plan:  Decrease heparin to 1300 units/hour    Check anti-Xa in 6 hours Daily HL and CBC Follow up re-start Eliquis  Monitor signs of bleeding   Thanks for allowing pharmacy to be a part of this patient's care.  Excell Seltzer, PharmD Clinical Pharmacist

## 2019-10-18 NOTE — Anesthesia Postprocedure Evaluation (Signed)
Anesthesia Post Note  Patient: Lauren [name]  Procedure(s) Performed: [name] DEBRIDEMENT (Right Groin) WOUND VAC CHANGE (Right Groin)     Patient location during evaluation: PACU Anesthesia Type: General Level of consciousness: awake and alert Pain management: pain level controlled Vital Signs Assessment: post-procedure vital signs reviewed and stable Respiratory status: spontaneous breathing, nonlabored ventilation and respiratory function stable Cardiovascular status: blood pressure returned to baseline and stable Postop Assessment: no apparent nausea or vomiting Anesthetic complications: no    Last Vitals:  Vitals:   10/18/19 1535 10/18/19 1545  BP: 122/76   Pulse: 93 94  Resp: 14 13  Temp:    SpO2: 99% 96%    Last Pain:  Vitals:   10/18/19 1530  TempSrc:   PainSc: 10-Worst pain ever                 Prateek Knipple,W. EDMOND

## 2019-10-18 NOTE — Transfer of Care (Signed)
Immediate Anesthesia Transfer of Care Note  Patient: Lauren [name]  Procedure(s) Performed: [name] DEBRIDEMENT (Right Groin) WOUND VAC CHANGE (Right Groin)  Patient Location: PACU  Anesthesia Type:General  Level of Consciousness: awake, alert , oriented and patient cooperative  Airway & Oxygen Therapy: Patient Spontanous Breathing  Post-op Assessment: Report given to RN and Post -op Vital signs reviewed and stable  Post vital signs: Reviewed and stable  Last Vitals:  Vitals Value Taken Time  BP 185/84 10/18/19 1505  Temp    Pulse 91 10/18/19 1506  Resp 15 10/18/19 1506  SpO2 99 % 10/18/19 1506  Vitals shown include unvalidated device data.  Last Pain:  Vitals:   10/18/19 0620  TempSrc: Oral  PainSc:       Patients Stated Pain Goal: 0 ([id number])  Complications: No apparent anesthesia complications

## 2019-10-18 NOTE — Anesthesia Preprocedure Evaluation (Addendum)
Anesthesia Evaluation  Patient identified by MRN, date of birth, ID band Patient awake    Reviewed: Allergy & Precautions, H&P , NPO status , Patient's Chart, lab work & pertinent test results, reviewed documented beta blocker date and time   Airway Mallampati: III  TM Distance: >3 FB Neck ROM: Full    Dental no notable dental hx. (+) Teeth Intact, Dental Advisory Given   Pulmonary COPD,  COPD inhaler, former smoker,    Pulmonary exam normal breath sounds clear to auscultation       Cardiovascular hypertension, Pt. on medications and Pt. on home beta blockers + Peripheral Vascular Disease and + DOE   Rhythm:Regular Rate:Normal     Neuro/Psych Anxiety Depression    GI/Hepatic Neg liver ROS, GERD  ,  Endo/Other  diabetes, Insulin Dependent  Renal/GU Renal disease  negative genitourinary   Musculoskeletal  (+) Arthritis ,   Abdominal   Peds  Hematology  (+) Blood dyscrasia, anemia ,   Anesthesia Other Findings   Reproductive/Obstetrics negative OB ROS                            Anesthesia Physical Anesthesia Plan  ASA: III  Anesthesia Plan: General   Post-op Pain Management:    Induction: Intravenous  PONV Risk Score and Plan: 4 or greater and Midazolam and Ondansetron  Airway Management Planned: LMA  Additional Equipment:   Intra-op Plan:   Post-operative Plan: Extubation in OR  Informed Consent: I have reviewed the patients History and Physical, chart, labs and discussed the procedure including the risks, benefits and alternatives for the proposed anesthesia with the patient or authorized representative who has indicated his/her understanding and acceptance.     Dental advisory given  Plan Discussed with: CRNA  Anesthesia Plan Comments:        Anesthesia Quick Evaluation

## 2019-10-18 NOTE — Progress Notes (Signed)
  Progress Note    10/18/2019 8:24 AM 3 Days Post-Op  Subjective:  No overnight issues  Vitals:   10/18/19 0620 10/18/19 0802  BP: (!) 130/45   Pulse: 83 77  Resp: 17   Temp: 98.6 F (37 C)   SpO2: 96%     Physical Exam: aaox3 Non labored respirations Right groin wound vac to suction Right 1st toe amputation site healing well  CBC    Component Value Date/Time   WBC 13.9 (H) 10/18/2019 0332   RBC 3.11 (L) 10/18/2019 0332   HGB 8.9 (L) 10/18/2019 0332   HGB 11.2 01/28/2018 1035   HGB 12.0 09/29/2011 1016   HCT 28.2 (L) 10/18/2019 0332   HCT 34.4 01/28/2018 1035   HCT 35.3 09/29/2011 1016   PLT 629 (H) 10/18/2019 0332   PLT 384 (H) 01/28/2018 1035   MCV 90.7 10/18/2019 0332   MCV 88 01/28/2018 1035   MCV 88.0 09/29/2011 1016   MCH 28.6 10/18/2019 0332   MCHC 31.6 10/18/2019 0332   RDW 16.7 (H) 10/18/2019 0332   RDW 17.6 (H) 01/28/2018 1035   RDW 14.8 (H) 09/29/2011 1016   LYMPHSABS 1.6 10/14/2019 0529   LYMPHSABS 0.6 (L) 09/29/2011 1016   MONOABS 1.7 (H) 10/14/2019 0529   MONOABS 0.8 09/29/2011 1016   EOSABS 0.1 10/14/2019 0529   EOSABS 0.3 09/29/2011 1016   BASOSABS 0.1 10/14/2019 0529   BASOSABS 0.1 09/29/2011 1016    BMET    Component Value Date/Time   NA 136 10/18/2019 0332   NA 142 01/28/2018 1035   K 3.9 10/18/2019 0332   CL 99 10/18/2019 0332   CO2 27 10/18/2019 0332   GLUCOSE 146 (H) 10/18/2019 0332   BUN 12 10/18/2019 0332   BUN 19 01/28/2018 1035   BUN 14.7 07/18/2014 1142   CREATININE 1.14 (H) 10/18/2019 0332   CREATININE 1.0 07/18/2014 1142   CALCIUM 8.6 (L) 10/18/2019 0332   GFRNONAA 49 (L) 10/18/2019 0332   GFRAA 57 (L) 10/18/2019 0332    INR    Component Value Date/Time   INR 1.2 10/18/2019 0332     Intake/Output Summary (Last 24 hours) at 10/18/2019 0824 Last data filed at 10/18/2019 [id number] Gross per 24 hour  Intake 620.69 ml  Output 2400 ml  Net -1779.31 ml     Assessment:  68 y.o. female is s/p right groin  debridement and 1st toe amp  Plan: OR today for groin debridement  Ronya Gilcrest C. Donzetta Matters, MD [facility] [place] Office: [phone] Pager: [phone]  10/18/2019 8:24 AM

## 2019-10-19 ENCOUNTER — Encounter (HOSPITAL_COMMUNITY): Payer: Self-pay | Admitting: Vascular Surgery

## 2019-10-19 LAB — CBC
HCT: 26 % — ABNORMAL LOW (ref 36.0–46.0)
Hemoglobin: 8.1 g/dL — ABNORMAL LOW (ref 12.0–15.0)
MCH: 28.3 pg (ref 26.0–34.0)
MCHC: 31.2 g/dL (ref 30.0–36.0)
MCV: 90.9 fL (ref 80.0–100.0)
Platelets: 637 10*3/uL — ABNORMAL HIGH (ref 150–400)
RBC: 2.86 MIL/uL — ABNORMAL LOW (ref 3.87–5.11)
RDW: 16.8 % — ABNORMAL HIGH (ref 11.5–15.5)
WBC: 17.2 10*3/uL — ABNORMAL HIGH (ref 4.0–10.5)
nRBC: 0 % (ref 0.0–0.2)

## 2019-10-19 LAB — GLUCOSE, CAPILLARY
Glucose-Capillary: 140 mg/dL — ABNORMAL HIGH (ref 70–99)
Glucose-Capillary: 270 mg/dL — ABNORMAL HIGH (ref 70–99)
Glucose-Capillary: 196 mg/dL — ABNORMAL HIGH (ref 70–99)
Glucose-Capillary: 223 mg/dL — ABNORMAL HIGH (ref 70–99)

## 2019-10-19 LAB — HEPARIN LEVEL (UNFRACTIONATED): Heparin Unfractionated: 0.38 IU/mL (ref 0.30–0.70)

## 2019-10-19 NOTE — Progress Notes (Signed)
ANTICOAGULATION CONSULT NOTE - Follow Up Consult  Pharmacy Consult for heparin Indication: h/o DVT  Allergies  Allergen Reactions  . Ambien [Zolpidem Tartrate] Other (See Comments)    Per the pt, "it makes me go crazy"  . Sulfa Antibiotics Rash  . Ativan [Lorazepam] Other (See Comments)    Patient has the opposite effect when taking medication   . Elavil [Amitriptyline Hcl] Other (See Comments)    Gastritis  . Prozac [Fluoxetine Hcl] Anxiety and Other (See Comments)    irritability     Patient Measurements: Height: 5' 7.48" (171.4 cm) Weight: 205 lb 4 oz (93.1 kg) IBW/kg (Calculated) : 62.7 Heparin Dosing Weight: 83 kg  Vital Signs: Temp: 98.8 F (37.1 C) (10/21 0349) Temp Source: Oral (10/21 0349) BP: 118/66 (10/21 0349) Pulse Rate: 81 (10/21 0349)  Labs: Recent Labs    10/17/19 0437 10/17/19 1448 10/17/19 1459 10/18/19 0332 10/19/19 0317  HGB 8.6*  --   --  8.9* 8.1*  HCT 28.5*  --   --  28.2* 26.0*  PLT 625*  --   --  629* 637*  APTT 108*  --  65* 116*  --   LABPROT  --   --   --  15.5*  --   INR  --   --   --  1.2  --   HEPARINUNFRC 0.90* 0.55  --  0.92* 0.38  CREATININE 1.20*  --   --  1.14*  --     Estimated Creatinine Clearance: 55.1 mL/min (A) (by C-G formula based on SCr of 1.14 mg/dL (H)).    Assessment: Eliquis PTA for femoral DVT (on PTA). LD Eliquis 10/14 PM.  Transition to IV heparin post-op 10/15, continues with frequent OR visits  Heparin level therapeutic CBC stable  Goal of Therapy:  Heparin level 0.3-0.7 units/ml  Monitor platelets by anticoagulation protocol: Yes   Plan:  Continue heparin at 1300 units / hr Daily HL and CBC Follow up re-start Eliquis  Monitor signs of bleeding   Thanks for allowing pharmacy to be a part of this patient's care.  Anette Guarneri, PharmD Clinical Pharmacist

## 2019-10-19 NOTE — Progress Notes (Addendum)
[facility] [place]  Subjective  - Resting well   Objective 118/66 81 98.8 F (37.1 C) (Oral) 12 94%  Intake/Output Summary (Last 24 hours) at 10/19/2019 0809 Last data filed at 10/19/2019 0529 Gross per 24 hour  Intake 50 ml  Output 2235 ml  Net -2185 ml    Right groin wound vac sealed well to suction. Right GT amp site healing well Lungs non labored breathing  Assessment/Planning: S/P right femoral endarterectomy  followed by GT amputation and Right groin sartorius muscle rotational flap.  Wound vac changes with groin debridement x 4. 10/18/2019 Findings: Wound edges laterally superiorly and on the medial inferior aspect were debrided back to healthy-appearing tissue.  Muscle beds all appear healthy including the muscle flap.  There is good signal below the muscle flap to suggest arterial patency although the artery is covered.  There is minimal granulation tissue in the wound bed.  Plan to return to OR for right groin debridement 10/21/2019.  Roxy Horseman 10/19/2019 8:09 AM --  Laboratory Lab Results: Recent Labs    10/18/19 0332 10/19/19 0317  WBC 13.9* 17.2*  HGB 8.9* 8.1*  HCT 28.2* 26.0*  PLT 629* 637*   BMET Recent Labs    10/17/19 0437 10/18/19 0332  NA 138 136  K 3.8 3.9  CL 99 99  CO2 26 27  GLUCOSE 105* 146*  BUN 11 12  CREATININE 1.20* 1.14*  CALCIUM 8.4* 8.6*    COAG Lab Results  Component Value Date   INR 1.2 10/18/2019   INR 1.2 09/29/2019   INR 1.1 09/15/2019   No results found for: PTT   I have independently interviewed and examined patient and agree with PA assessment and plan above.   [place]x Brogden C. Donzetta Matters, MD [facility] [place] Office: [phone] Pager: [phone]

## 2019-10-20 LAB — GLUCOSE, CAPILLARY
Glucose-Capillary: 192 mg/dL — ABNORMAL HIGH (ref 70–99)
Glucose-Capillary: 241 mg/dL — ABNORMAL HIGH (ref 70–99)
Glucose-Capillary: 181 mg/dL — ABNORMAL HIGH (ref 70–99)
Glucose-Capillary: 203 mg/dL — ABNORMAL HIGH (ref 70–99)

## 2019-10-20 LAB — CBC
HCT: 26.6 % — ABNORMAL LOW (ref 36.0–46.0)
Hemoglobin: 8 g/dL — ABNORMAL LOW (ref 12.0–15.0)
MCH: 27.8 pg (ref 26.0–34.0)
MCHC: 30.1 g/dL (ref 30.0–36.0)
MCV: 92.4 fL (ref 80.0–100.0)
Platelets: 650 10*3/uL — ABNORMAL HIGH (ref 150–400)
RBC: 2.88 MIL/uL — ABNORMAL LOW (ref 3.87–5.11)
RDW: 17.1 % — ABNORMAL HIGH (ref 11.5–15.5)
WBC: 15.3 10*3/uL — ABNORMAL HIGH (ref 4.0–10.5)
nRBC: 0 % (ref 0.0–0.2)

## 2019-10-20 LAB — HEPARIN LEVEL (UNFRACTIONATED): Heparin Unfractionated: 0.41 IU/mL (ref 0.30–0.70)

## 2019-10-20 MED ORDER — AMIODARONE HCL IN DEXTROSE 360-4.14 MG/200ML-% IV SOLN: 30.0000 mg/h | INTRAVENOUS | Status: DC

## 2019-10-20 MED ORDER — AMIODARONE HCL IN DEXTROSE 360-4.14 MG/200ML-% IV SOLN: 60.0000 mg/h | INTRAVENOUS | Status: DC

## 2019-10-20 NOTE — Progress Notes (Signed)
Initial Nutrition Assessment  DOCUMENTATION CODES:   Obesity unspecified  INTERVENTION:    Continue Glucerna Shake po TID, each supplement provides 220 kcal and 10 grams of protein  MVI daily   NUTRITION DIAGNOSIS:   Increased nutrient needs related to post-op healing as evidenced by estimated needs.  GOAL:   Patient will meet greater than or equal to 90% of their needs  MONITOR:   PO intake, Supplement acceptance, Weight trends, Labs, I & O's  REASON FOR ASSESSMENT:   Other (Comment)(LOS)    ASSESSMENT:   Patient with PMH significant for HTN, HLD, DM, severe COPD, cor pulmonale, and CAD. Presents this admission with R lower extremity critical limb ischemia.   10/1- R iliofemoral endart w/ vein angioplasty 10/13-R groin debridement, R great toe amputation 10/15- R groin debridement, wound VAC 10/17- R groin debridement, change wound VAC 10/20 R groin debridement, change wound VAC  Plan fifth debridement tomorrow.   Pt denies having loss of appetite PTA. Eats 2-3 meals daily that consist of good protein sources. Appetite off/on this admission. Meal completions charted as 70-100% for pt's last 7 meals. Drinking Glucerna off/on per pt reports.  Pt endorses a UBW of 200 lb and denies recent weight loss. Records indicate pt has maintained her weight over the last year.   I/O: -21, 274 ml since admit UOP: 3,300 ml x 24 hrs  Wound VAC: 225 ml x 24 hrs   Medications: colace, ferrous sulfate, folic acid, 40 mg lasix BID, SS novolog, lantus, Mag-Ox, MVI with minerals, 10 mEq KCl BID, senokot Labs: CBG 83-218   NUTRITION - FOCUSED PHYSICAL EXAM:    Most Recent Value  Orbital Region  No depletion  Upper Arm Region  No depletion  Thoracic and Lumbar Region  Unable to assess  Buccal Region  No depletion  Temple Region  No depletion  Clavicle Bone Region  No depletion  Clavicle and Acromion Bone Region  No depletion  Scapular Bone Region  Unable to assess  Dorsal  Hand  No depletion  Patellar Region  No depletion  Anterior Thigh Region  No depletion  Posterior Calf Region  No depletion  Edema (RD Assessment)  None  Hair  Reviewed  Eyes  Reviewed  Mouth  Reviewed  Skin  Reviewed  Nails  Reviewed     Diet Order:   Diet Order            Diet NPO time specified Except for: Sips with Meds  Diet effective midnight        Diet Carb Modified Fluid consistency: Thin; Room service appropriate? Yes with Assist  Diet effective now              EDUCATION NEEDS:   Education needs have been addressed  Skin:  Skin Assessment: Skin Integrity Issues: Skin Integrity Issues:: Incisions, Wound VAC Wound Vac: R groin Incisions: R foot/R groin  Last BM:  10/13  Height:   Ht Readings from Last 1 Encounters:  10/18/19 5' 7.48" (1.714 m)    Weight:   Wt Readings from Last 1 Encounters:  10/18/19 93.1 kg    Ideal Body Weight:  63.6 kg  BMI:  Body mass index is 31.69 kg/m.  Estimated Nutritional Needs:   Kcal:  1700-1900 kcal  Protein:  85-90 grams  Fluid:  >/= 1.7 L/day   Mariana Single RD, LDN Clinical Nutrition Pager # - [phone]

## 2019-10-20 NOTE — Progress Notes (Signed)
ANTICOAGULATION CONSULT NOTE - Follow Up Consult  Pharmacy Consult for heparin Indication: h/o DVT  Allergies  Allergen Reactions  . Ambien [Zolpidem Tartrate] Other (See Comments)    Per the pt, "it makes me go crazy"  . Sulfa Antibiotics Rash  . Ativan [Lorazepam] Other (See Comments)    Patient has the opposite effect when taking medication   . Elavil [Amitriptyline Hcl] Other (See Comments)    Gastritis  . Prozac [Fluoxetine Hcl] Anxiety and Other (See Comments)    irritability     Patient Measurements: Height: 5' 7.48" (171.4 cm) Weight: 205 lb 4 oz (93.1 kg) IBW/kg (Calculated) : 62.7 Heparin Dosing Weight: 83 kg  Vital Signs: Temp: 98.4 F (36.9 C) (10/22 0500) Temp Source: Oral (10/22 0500) BP: 128/67 (10/22 0500) Pulse Rate: 82 (10/22 0500)  Labs: Recent Labs    10/17/19 1459  10/18/19 0332 10/19/19 0317 10/20/19 0246  HGB  --    < > 8.9* 8.1* 8.0*  HCT  --   --  28.2* 26.0* 26.6*  PLT  --   --  629* 637* 650*  APTT 65*  --  116*  --   --   LABPROT  --   --  15.5*  --   --   INR  --   --  1.2  --   --   HEPARINUNFRC  --   --  0.92* 0.38 0.41  CREATININE  --   --  1.14*  --   --    < > = values in this interval not displayed.    Estimated Creatinine Clearance: 55.1 mL/min (A) (by C-G formula based on SCr of 1.14 mg/dL (H)).    Assessment: Eliquis PTA for femoral DVT (on PTA). LD Eliquis 10/14 PM.  Transition to IV heparin post-op 10/15, continues with frequent OR visits  Heparin level therapeutic CBC stable  Goal of Therapy:  Heparin level 0.3-0.7 units/ml  Monitor platelets by anticoagulation protocol: Yes   Plan:  Continue heparin at 1300 units / hr Daily HL and CBC Follow up re-start Eliquis  Monitor signs of bleeding   Thanks for allowing pharmacy to be a part of this patient's care.  Anette Guarneri, PharmD Clinical Pharmacist

## 2019-10-20 NOTE — Progress Notes (Addendum)
[facility]  Subjective  - Now new complaints   Objective 128/67 82 98.4 F (36.9 C) (Oral) 12 94%  Intake/Output Summary (Last 24 hours) at 10/20/2019 0743 Last data filed at 10/20/2019 [id number] Gross per 24 hour  Intake 949.23 ml  Output 3525 ml  Net -2575.77 ml    Right groin wound vac to suction Right GT amp site healing well Doppler AT signal Lungs non labored breathing  Assessment/Planning: S/P right femoral endarterectomy  followed by GT amputation and Right groin sartorius muscle rotational flap.  Wound vac changes with groin debridement x 4. 10/18/2019     Plan to return to OR for right groin debridement 10/21/2019.  NPO past MN. HGB 8.0 currently asymptomatic will observe   Roxy Horseman 10/20/2019 7:43 AM --  Laboratory Lab Results: Recent Labs    10/19/19 0317 10/20/19 0246  WBC 17.2* 15.3*  HGB 8.1* 8.0*  HCT 26.0* 26.6*  PLT 637* 650*   BMET Recent Labs    10/18/19 0332  NA 136  K 3.9  CL 99  CO2 27  GLUCOSE 146*  BUN 12  CREATININE 1.14*  CALCIUM 8.6*    COAG Lab Results  Component Value Date   INR 1.2 10/18/2019   INR 1.2 09/29/2019   INR 1.1 09/15/2019   No results found for: PTT   I have independently interviewed and patient and agree with PA assessment and plan above.  Reevaluate in OR with Dr. Carlis Abbott tomorrow.  If no further debridement is necessary we will consider bedside wound VAC change early next week.  Butler Vegh C. Donzetta Matters, MD [facility] Office: [phone] Pager: [phone]

## 2019-10-21 ENCOUNTER — Inpatient Hospital Stay (HOSPITAL_COMMUNITY): Payer: Medicare Other | Admitting: Anesthesiology

## 2019-10-21 ENCOUNTER — Encounter (HOSPITAL_COMMUNITY)
Admission: RE | Disposition: A | Discharge status: Hospice | Payer: Self-pay | Source: Home / Self Care | Attending: Vascular Surgery

## 2019-10-21 ENCOUNTER — Encounter (HOSPITAL_COMMUNITY): Payer: Self-pay | Admitting: Orthopedic Surgery

## 2019-10-21 HISTORY — PX: APPLICATION OF WOUND VAC: SHX5189

## 2019-10-21 HISTORY — PX: GROIN DEBRIDEMENT: SHX5159

## 2019-10-21 LAB — CBC
HCT: 28.1 % — ABNORMAL LOW (ref 36.0–46.0)
Hemoglobin: 8.4 g/dL — ABNORMAL LOW (ref 12.0–15.0)
MCH: 27.5 pg (ref 26.0–34.0)
MCHC: 29.9 g/dL — ABNORMAL LOW (ref 30.0–36.0)
MCV: 92.1 fL (ref 80.0–100.0)
Platelets: 661 10*3/uL — ABNORMAL HIGH (ref 150–400)
RBC: 3.05 MIL/uL — ABNORMAL LOW (ref 3.87–5.11)
RDW: 17 % — ABNORMAL HIGH (ref 11.5–15.5)
WBC: 17.2 10*3/uL — ABNORMAL HIGH (ref 4.0–10.5)
nRBC: 0 % (ref 0.0–0.2)

## 2019-10-21 LAB — GLUCOSE, CAPILLARY
Glucose-Capillary: 163 mg/dL — ABNORMAL HIGH (ref 70–99)
Glucose-Capillary: 142 mg/dL — ABNORMAL HIGH (ref 70–99)
Glucose-Capillary: 118 mg/dL — ABNORMAL HIGH (ref 70–99)
Glucose-Capillary: 145 mg/dL — ABNORMAL HIGH (ref 70–99)
Glucose-Capillary: 100 mg/dL — ABNORMAL HIGH (ref 70–99)

## 2019-10-21 LAB — HEPARIN LEVEL (UNFRACTIONATED): Heparin Unfractionated: 0.68 IU/mL (ref 0.30–0.70)

## 2019-10-21 SURGERY — DEBRIDEMENT, INGUINAL REGION
Anesthesia: General | Laterality: Right

## 2019-10-21 MED ORDER — 0.9 % SODIUM CHLORIDE (POUR BTL) OPTIME
TOPICAL | Status: DC | PRN
  Administered 2019-10-21: 11:00:00 1000 mL

## 2019-10-21 MED ORDER — FENTANYL CITRATE (PF) 100 MCG/2ML IJ SOLN
INTRAMUSCULAR | Status: DC | PRN
  Administered 2019-10-21: 25 ug via INTRAVENOUS
  Administered 2019-10-21: 50 ug via INTRAVENOUS
  Administered 2019-10-21 (×2): 25 ug via INTRAVENOUS

## 2019-10-21 MED ORDER — PHENYLEPHRINE HCL (PRESSORS) 10 MG/ML IV SOLN
INTRAVENOUS | Status: DC | PRN
  Administered 2019-10-21: 80 ug via INTRAVENOUS

## 2019-10-21 MED ORDER — LACTATED RINGERS IV SOLN
INTRAVENOUS | Status: DC | PRN
  Administered 2019-10-21: 10:00:00 via INTRAVENOUS

## 2019-10-21 MED ORDER — LIDOCAINE 2% (20 MG/ML) 5 ML SYRINGE
INTRAMUSCULAR | Status: DC | PRN
  Administered 2019-10-21: 100 mg via INTRAVENOUS

## 2019-10-21 MED ORDER — CEFAZOLIN SODIUM 1 G IJ SOLR
INTRAMUSCULAR | Status: AC
  Filled 2019-10-21: qty 20

## 2019-10-21 MED ORDER — LACTATED RINGERS IV SOLN
INTRAVENOUS | Status: DC
  Administered 2019-10-21: 10:00:00 via INTRAVENOUS

## 2019-10-21 MED ORDER — FENTANYL CITRATE (PF) 250 MCG/5ML IJ SOLN
INTRAMUSCULAR | Status: AC
  Filled 2019-10-21: qty 5

## 2019-10-21 MED ORDER — MIDAZOLAM HCL 2 MG/2ML IJ SOLN
INTRAMUSCULAR | Status: AC
  Filled 2019-10-21: qty 2

## 2019-10-21 MED ORDER — MEPERIDINE HCL 25 MG/ML IJ SOLN: 6.2500 mg | INTRAMUSCULAR | Status: DC | PRN

## 2019-10-21 MED ORDER — HYDROMORPHONE HCL 1 MG/ML IJ SOLN
INTRAMUSCULAR | Status: AC
  Filled 2019-10-21: qty 1

## 2019-10-21 MED ORDER — PROPOFOL 10 MG/ML IV BOLUS
INTRAVENOUS | Status: DC | PRN
  Administered 2019-10-21: 175 mg via INTRAVENOUS

## 2019-10-21 MED ORDER — ONDANSETRON HCL 4 MG/2ML IJ SOLN: 4.0000 mg | Freq: Once | INTRAMUSCULAR | Status: DC | PRN

## 2019-10-21 MED ORDER — HYDROMORPHONE HCL 1 MG/ML IJ SOLN
0.2500 mg | INTRAMUSCULAR | Status: DC | PRN
  Administered 2019-10-21 (×2): 0.5 mg via INTRAVENOUS

## 2019-10-21 MED ORDER — PROPOFOL 10 MG/ML IV BOLUS
INTRAVENOUS | Status: AC
  Filled 2019-10-21: qty 20

## 2019-10-21 MED ORDER — HEPARIN SODIUM (PORCINE) 1000 UNIT/ML IJ SOLN
INTRAMUSCULAR | Status: AC
  Filled 2019-10-21: qty 1

## 2019-10-21 MED ORDER — ONDANSETRON HCL 4 MG/2ML IJ SOLN
INTRAMUSCULAR | Status: DC | PRN
  Administered 2019-10-21: 4 mg via INTRAVENOUS

## 2019-10-21 MED ORDER — CEFAZOLIN SODIUM-DEXTROSE 2-3 GM-%(50ML) IV SOLR
INTRAVENOUS | Status: DC | PRN
  Administered 2019-10-21: 2 g via INTRAVENOUS

## 2019-10-21 MED ORDER — MIDAZOLAM HCL 5 MG/5ML IJ SOLN
INTRAMUSCULAR | Status: DC | PRN
  Administered 2019-10-21: 2 mg via INTRAVENOUS

## 2019-10-21 SURGICAL SUPPLY — 40 items
ANCHOR CATH FOLEY SECURE (MISCELLANEOUS) ×2 IMPLANT
APL SKNCLS STERI-STRIP NONHPOA (GAUZE/BANDAGES/DRESSINGS) ×1
BENZOIN TINCTURE PRP APPL 2/3 (GAUZE/BANDAGES/DRESSINGS) ×2 IMPLANT
CANISTER SUCT 3000ML PPV (MISCELLANEOUS) ×3 IMPLANT
CANISTER WOUND CARE 500ML ATS (WOUND CARE) IMPLANT
COVER SURGICAL LIGHT HANDLE (MISCELLANEOUS) ×3 IMPLANT
COVER WAND RF STERILE (DRAPES) ×3 IMPLANT
DRAPE INCISE IOBAN 66X45 STRL (DRAPES) ×5 IMPLANT
DRAPE ORTHO SPLIT 77X108 STRL (DRAPES) ×3
DRAPE SURG ORHT 6 SPLT 77X108 (DRAPES) ×1 IMPLANT
DRSG MEPITEL 3X4 ME34 (GAUZE/BANDAGES/DRESSINGS) ×2 IMPLANT
DRSG VAC ATS LRG SENSATRAC (GAUZE/BANDAGES/DRESSINGS) ×4 IMPLANT
DRSG VAC ATS MED SENSATRAC (GAUZE/BANDAGES/DRESSINGS) IMPLANT
DRSG VAC ATS SM SENSATRAC (GAUZE/BANDAGES/DRESSINGS) IMPLANT
ELECT REM PT RETURN 9FT ADLT (ELECTROSURGICAL) ×3
ELECTRODE REM PT RTRN 9FT ADLT (ELECTROSURGICAL) ×1 IMPLANT
GAUZE SPONGE 4X4 12PLY STRL (GAUZE/BANDAGES/DRESSINGS) ×3 IMPLANT
GLOVE BIO SURGEON STRL SZ7.5 (GLOVE) ×3 IMPLANT
GLOVE BIOGEL PI IND STRL 8 (GLOVE) ×1 IMPLANT
GLOVE BIOGEL PI INDICATOR 8 (GLOVE) ×2
GOWN STRL REUS W/ TWL LRG LVL3 (GOWN DISPOSABLE) ×2 IMPLANT
GOWN STRL REUS W/ TWL XL LVL3 (GOWN DISPOSABLE) ×2 IMPLANT
GOWN STRL REUS W/TWL LRG LVL3 (GOWN DISPOSABLE) ×6
GOWN STRL REUS W/TWL XL LVL3 (GOWN DISPOSABLE) ×6
KIT BASIN OR (CUSTOM PROCEDURE TRAY) ×3 IMPLANT
KIT TURNOVER KIT B (KITS) ×3 IMPLANT
NS IRRIG 1000ML POUR BTL (IV SOLUTION) ×3 IMPLANT
PACK GENERAL/GYN (CUSTOM PROCEDURE TRAY) ×3 IMPLANT
PACK UNIVERSAL I (CUSTOM PROCEDURE TRAY) ×3 IMPLANT
PAD ARMBOARD 7.5X6 YLW CONV (MISCELLANEOUS) ×6 IMPLANT
SPONGE LAP 18X18 RF (DISPOSABLE) ×2 IMPLANT
STAPLER VISISTAT 35W (STAPLE) IMPLANT
SUT ETHILON 2 0 PSLX (SUTURE) IMPLANT
SUT ETHILON 3 0 PS 1 (SUTURE) IMPLANT
SUT MNCRL AB 4-0 PS2 18 (SUTURE) IMPLANT
SUT VIC AB 2-0 CTB1 (SUTURE) IMPLANT
SUT VIC AB 3-0 SH 27 (SUTURE)
SUT VIC AB 3-0 SH 27X BRD (SUTURE) IMPLANT
TOWEL GREEN STERILE (TOWEL DISPOSABLE) ×3 IMPLANT
WATER STERILE IRR 1000ML POUR (IV SOLUTION) ×3 IMPLANT

## 2019-10-21 NOTE — Op Note (Signed)
Date: October 21, 2019  Preoperative Diagnosis: Right groin postoperative wound  Postoperative Diagnosis: Same  Surgeon: Marty Heck, MD  Assistant: Roxy Horseman, PA  Procedure: 1.  Sharp excisional debridement of skin and subcutaneous tissue of right groin wound now measuring 22 cm x 10 cm x 5 cm 2.  Negative pressure wound VAC application  Indications: Patient is a 68 year old female with a history of right femoral endarterectomy.  She subsequently developed wound failure in the right groin that has required multiple debridements in the operating room.  She presents today for VAC change and possible further debridement.  Findings: Wound edges superiorly were debrided back to healthy-appearing tissue.  All the muscle and the wound bed appeared healthy including the sartorius flap.  There was evidence of some granulation tissue forming in the wound bed.  Procedure: The patient was taken to the operating room after informed consent was obtained.  She was placed on operative table in supine position.  After anesthesia was induced her previous vac was removed.  Her right groin was then prepped and draped in usual sterile fashion.  Used a 10 blade scalpel and performed sharp excisional debridement of the skin and subcutaneous tissue along the superior margin of the wound.  Hemostasis was achieved with Bovie cautery.  The wound was copiously irrigated until the effluent was clear.  A large wound VAC was brought on the field trimmed to the appropriate diameter.  It was put in the wound bed and then Ioban was used to get seal.  This was attached wound VAC with good seal.  EBL: Minimal  Condition: Stable  Complication: None  Marty Heck, MD [facility] [place] Office: [phone] Pager: Scissors

## 2019-10-21 NOTE — Anesthesia Preprocedure Evaluation (Signed)
Anesthesia Evaluation  Patient identified by MRN, date of birth, ID band Patient awake    Reviewed: Allergy & Precautions, NPO status , Patient's Chart, lab work & pertinent test results  Airway Mallampati: II  TM Distance: >3 FB Neck ROM: Full    Dental   Pulmonary COPD, former smoker,    Pulmonary exam normal        Cardiovascular hypertension, Pt. on medications + CAD  Normal cardiovascular exam     Neuro/Psych Anxiety Depression CVA    GI/Hepatic GERD  Medicated and Controlled,  Endo/Other  diabetes, Type 2, Oral Hypoglycemic Agents  Renal/GU      Musculoskeletal   Abdominal   Peds  Hematology   Anesthesia Other Findings   Reproductive/Obstetrics                             Anesthesia Physical Anesthesia Plan  ASA: III  Anesthesia Plan: General   Post-op Pain Management:    Induction: Intravenous  PONV Risk Score and Plan: 3  Airway Management Planned: LMA  Additional Equipment:   Intra-op Plan:   Post-operative Plan: Extubation in OR  Informed Consent: I have reviewed the patients History and Physical, chart, labs and discussed the procedure including the risks, benefits and alternatives for the proposed anesthesia with the patient or authorized representative who has indicated his/her understanding and acceptance.       Plan Discussed with: CRNA and Surgeon  Anesthesia Plan Comments:         Anesthesia Quick Evaluation

## 2019-10-21 NOTE — Anesthesia Postprocedure Evaluation (Signed)
Anesthesia Post Note  Patient: Lauren [name]  Procedure(s) Performed: [name] DEBRIDEMENT RIGHT GROIN (Right ) APPLICATION OF WOUND VAC EXCHANGE (Right )     Patient location during evaluation: PACU Anesthesia Type: General Level of consciousness: awake and alert Pain management: pain level controlled Vital Signs Assessment: post-procedure vital signs reviewed and stable Respiratory status: spontaneous breathing, nonlabored ventilation, respiratory function stable and patient connected to nasal cannula oxygen Cardiovascular status: blood pressure returned to baseline and stable Postop Assessment: no apparent nausea or vomiting Anesthetic complications: no    Last Vitals:  Vitals:   10/21/19 1210 10/21/19 1357  BP: (!) 170/82 (!) 133/93  Pulse: 95   Resp: 12   Temp: 37.2 C   SpO2: 100%     Last Pain:  Vitals:   10/21/19 1210  TempSrc: Oral  PainSc:                  Jaimy Kliethermes DAVID

## 2019-10-21 NOTE — Progress Notes (Signed)
[facility]  Subjective  - no complaints   Objective (!) 138/58 78 98.5 F (36.9 C) (Oral) 17 96%  Intake/Output Summary (Last 24 hours) at 10/21/2019 1008 Last data filed at 10/21/2019 0900 Gross per 24 hour  Intake 632.28 ml  Output 2900 ml  Net -2267.72 ml    Right groin wound vac to suction Right GT amp site healing well Doppler AT signal Lungs non labored breathing  Laboratory Lab Results: Recent Labs    10/20/19 0246 10/21/19 0337  WBC 15.3* 17.2*  HGB 8.0* 8.4*  HCT 26.6* 28.1*  PLT 650* 661*   BMET No results for input(s): NA, K, CL, CO2, GLUCOSE, BUN, CREATININE, CALCIUM in the last 72 hours.  COAG Lab Results  Component Value Date   INR 1.2 10/18/2019   INR 1.2 09/29/2019   INR 1.1 09/15/2019   No results found for: PTT  Assessment/Planning:  S/P right femoral endarterectomy followed by GT amputation and right groin sartorius muscle rotational flap. Wound vac changes with groin debridement x 4.  Vac change in right groin today in OR.  Marty Heck 10/21/2019 10:08 AM --

## 2019-10-21 NOTE — Anesthesia Procedure Notes (Signed)
Procedure Name: LMA Insertion Date/Time: 10/21/2019 10:24 AM Performed by: Neldon Newport, CRNA Pre-anesthesia Checklist: Timeout performed, Patient being monitored, Suction available, Emergency Drugs available and Patient identified Patient Re-evaluated:Patient Re-evaluated prior to induction Oxygen Delivery Method: Circle system utilized Preoxygenation: Pre-oxygenation with 100% oxygen Induction Type: IV induction Ventilation: Mask ventilation without difficulty LMA: LMA inserted LMA Size: 4.0 Number of attempts: 2 Placement Confirmation: breath sounds checked- equal and bilateral,  positive ETCO2 and ETT inserted through vocal cords under direct vision Tube secured with: Tape Dental Injury: Teeth and Oropharynx as per pre-operative assessment

## 2019-10-21 NOTE — Transfer of Care (Signed)
Immediate Anesthesia Transfer of Care Note  Patient: Lauren [name]  Procedure(s) Performed: [name] DEBRIDEMENT RIGHT GROIN (Right ) APPLICATION OF WOUND VAC EXCHANGE (Right )  Patient Location: PACU  Anesthesia Type:General  Level of Consciousness: awake, alert  and oriented  Airway & Oxygen Therapy: Patient Spontanous Breathing and Patient connected to nasal cannula oxygen  Post-op Assessment: Report given to RN, Post -op Vital signs reviewed and stable and Patient moving all extremities X 4  Post vital signs: Reviewed and stable  Last Vitals:  Vitals Value Taken Time  BP 121/105 10/21/19 1124  Temp    Pulse 91 10/21/19 1124  Resp 17 10/21/19 1125  SpO2 100 % 10/21/19 1124  Vitals shown include unvalidated device data.  Last Pain:  Vitals:   10/21/19 0746  TempSrc: Oral  PainSc:       Patients Stated Pain Goal: 0 ([id number])  Complications: No apparent anesthesia complications

## 2019-10-21 NOTE — Progress Notes (Signed)
OT Cancellation Note  Patient Details Name: Lauren [name] MRN: [id number] DOB: June 21, 1951   Cancelled Treatment:    Reason Eval/Treat Not Completed: Other (comment);Patient at procedure or test/ unavailable Pt at procedure for Negative pressure wound VAC application. Will check back as time allows.   [place], COTA/L [facility] [place] 10/21/2019, 11:40 AM

## 2019-10-21 NOTE — Progress Notes (Signed)
PT Cancellation Note  Patient Details Name: ALEINA CONTANT MRN: [id number] DOB: 12/30/1950   Cancelled Treatment:    Reason Eval/Treat Not Completed: Patient declined, no reason specified. Pt reports she is in too much pain after procedure to mobilize or perform therapeutic exercise despite PT encouragement. PT will continue to follow per PT POC.   Zenaida Niece 10/21/2019, 2:55 PM

## 2019-10-21 NOTE — Progress Notes (Signed)
ANTICOAGULATION CONSULT NOTE - Follow Up Consult  Pharmacy Consult for heparin Indication: h/o DVT  Allergies  Allergen Reactions  . Ambien [Zolpidem Tartrate] Other (See Comments)    Per the pt, "it makes me go crazy"  . Sulfa Antibiotics Rash  . Ativan [Lorazepam] Other (See Comments)    Patient has the opposite effect when taking medication   . Elavil [Amitriptyline Hcl] Other (See Comments)    Gastritis  . Prozac [Fluoxetine Hcl] Anxiety and Other (See Comments)    irritability     Patient Measurements: Height: 5' 7.48" (171.4 cm) Weight: 205 lb 4 oz (93.1 kg) IBW/kg (Calculated) : 62.7 Heparin Dosing Weight: 83 kg  Vital Signs: Temp: 98.5 F (36.9 C) (10/23 0746) Temp Source: Oral (10/23 0746) BP: 138/58 (10/23 0746)  Labs: Recent Labs    10/19/19 0317 10/20/19 0246 10/21/19 0337  HGB 8.1* 8.0* 8.4*  HCT 26.0* 26.6* 28.1*  PLT 637* 650* 661*  HEPARINUNFRC 0.38 0.41 0.68    Estimated Creatinine Clearance: 55.1 mL/min (A) (by C-G formula based on SCr of 1.14 mg/dL (H)).   Assessment: Eliquis PTA for femoral DVT (on PTA). LD Eliquis 10/14 PM.  Transition to IV heparin post-op 10/15, continues with frequent OR visits  Heparin level therapeutic this morning.  No overt bleeding or complications noted.  Hgb low but fairly stable, Pltc elevated.  Goal of Therapy:  Heparin level 0.3-0.7 units/ml  Monitor platelets by anticoagulation protocol: Yes   Plan:  Continue heparin at 1300 units / hr Daily HL and CBC Follow up re-start Eliquis  Monitor signs of bleeding   Thanks for allowing pharmacy to be a part of this patient's care.  Marguerite Olea, [facility] Clinical Pharmacist Phone [phone]  10/21/2019 8:42 AM

## 2019-10-22 ENCOUNTER — Encounter (HOSPITAL_COMMUNITY): Payer: Self-pay | Admitting: Vascular Surgery

## 2019-10-22 LAB — CBC
HCT: 27.3 % — ABNORMAL LOW (ref 36.0–46.0)
Hemoglobin: 8.2 g/dL — ABNORMAL LOW (ref 12.0–15.0)
MCH: 27.5 pg (ref 26.0–34.0)
MCHC: 30 g/dL (ref 30.0–36.0)
MCV: 91.6 fL (ref 80.0–100.0)
Platelets: 658 10*3/uL — ABNORMAL HIGH (ref 150–400)
RBC: 2.98 MIL/uL — ABNORMAL LOW (ref 3.87–5.11)
RDW: 17.2 % — ABNORMAL HIGH (ref 11.5–15.5)
WBC: 18.4 10*3/uL — ABNORMAL HIGH (ref 4.0–10.5)
nRBC: 0 % (ref 0.0–0.2)

## 2019-10-22 LAB — GLUCOSE, CAPILLARY
Glucose-Capillary: 123 mg/dL — ABNORMAL HIGH (ref 70–99)
Glucose-Capillary: 210 mg/dL — ABNORMAL HIGH (ref 70–99)
Glucose-Capillary: 198 mg/dL — ABNORMAL HIGH (ref 70–99)
Glucose-Capillary: 239 mg/dL — ABNORMAL HIGH (ref 70–99)

## 2019-10-22 LAB — HEPARIN LEVEL (UNFRACTIONATED): Heparin Unfractionated: 0.31 IU/mL (ref 0.30–0.70)

## 2019-10-22 MED ORDER — GERHARDT'S BUTT CREAM
TOPICAL_CREAM | CUTANEOUS | Status: DC | PRN
  Filled 2019-10-22 (×3): qty 1

## 2019-10-22 NOTE — Progress Notes (Addendum)
[facility] [place]  Subjective  - Comfortable without new complaints.   Objective 130/68 88 98.6 F (37 C) (Oral) 16 93%  Intake/Output Summary (Last 24 hours) at 10/22/2019 0726 Last data filed at 10/22/2019 0406 Gross per 24 hour  Intake 813.95 ml  Output 1535 ml  Net -721.05 ml    Right groin wound vac to suction with good seal Right GT amputation site intact, slight erythema sutures intact and foot warm to touch. AT doppler signal Lungs non labored breathing  Assessment/Planning: S/P right femoral endarterectomy followed by GT amputation and right groin sartorius muscle rotational flap. Wound vac changes with groin debridement x 4.   Muscle flap appears viable with good skin edges 22 cm x 10 cm x 5 cm Leukocytosis will order cbc She has been receiving Ancef pre-op for wound vac changes.  Encourage IS. We don't think she will be able to tolerate bedside wound vac changes so we will need to plan her next vac change in the OR.  Roxy Horseman 10/22/2019 7:26 AM --  Laboratory Lab Results: Recent Labs    10/21/19 0337 10/22/19 0243  WBC 17.2* 18.4*  HGB 8.4* 8.2*  HCT 28.1* 27.3*  PLT 661* 658*   BMET No results for input(s): NA, K, CL, CO2, GLUCOSE, BUN, CREATININE, CALCIUM in the last 72 hours.  COAG Lab Results  Component Value Date   INR 1.2 10/18/2019   INR 1.2 09/29/2019   INR 1.1 09/15/2019   No results found for: PTT   I have seen and evaluated the patient. I agree with the PA note as documented above.  Very emotional and upset this morning.  Underwent VAC change again in the OR yesterday with minimal debridement.  The wound looked overall pretty good.  VAC with good seal.  Right foot has an AT signal and the great toe amp site looks marginal.  Next VAC change early next week.  Marty Heck, MD [facility] [place] Office: [phone] Pager: [phone]

## 2019-10-22 NOTE — Progress Notes (Signed)
ANTICOAGULATION CONSULT NOTE - Follow Up Consult  Pharmacy Consult for heparin Indication: h/o DVT  Allergies  Allergen Reactions  . Ambien [Zolpidem Tartrate] Other (See Comments)    Per the pt, "it makes me go crazy"  . Sulfa Antibiotics Rash  . Ativan [Lorazepam] Other (See Comments)    Patient has the opposite effect when taking medication   . Elavil [Amitriptyline Hcl] Other (See Comments)    Gastritis  . Prozac [Fluoxetine Hcl] Anxiety and Other (See Comments)    irritability     Patient Measurements: Height: 5' 7.48" (171.4 cm) Weight: 205 lb 4 oz (93.1 kg) IBW/kg (Calculated) : 62.7 Heparin Dosing Weight: 83 kg  Vital Signs: Temp: 99.1 F (37.3 C) (10/24 0842) Temp Source: Oral (10/24 0842) BP: 158/62 (10/24 0842) Pulse Rate: 90 (10/24 0842)  Labs: Recent Labs    10/20/19 0246 10/21/19 0337 10/22/19 0243  HGB 8.0* 8.4* 8.2*  HCT 26.6* 28.1* 27.3*  PLT 650* 661* 658*  HEPARINUNFRC 0.41 0.68 0.31    Estimated Creatinine Clearance: 55.1 mL/min (A) (by C-G formula based on SCr of 1.14 mg/dL (H)).   Assessment: Eliquis PTA for femoral DVT (on PTA). LD Eliquis 10/14 PM.  Transition to IV heparin post-op 10/15, continues with frequent OR visits  Heparin level therapeutic this morning.  No overt bleeding or complications noted.  Hgb low but fairly stable, Pltc elevated.  Goal of Therapy:  Heparin level 0.3-0.7 units/ml  Monitor platelets by anticoagulation protocol: Yes   Plan:  Continue heparin at 1300 units / hr Daily HL and CBC Follow up re-start Eliquis  Monitor signs of bleeding   Thanks for allowing pharmacy to be a part of this patient's care.  Marguerite Olea, [facility] Clinical Pharmacist Phone [phone]  10/22/2019 9:09 AM

## 2019-10-23 LAB — CBC
HCT: 27.4 % — ABNORMAL LOW (ref 36.0–46.0)
Hemoglobin: 8.4 g/dL — ABNORMAL LOW (ref 12.0–15.0)
MCH: 27.7 pg (ref 26.0–34.0)
MCHC: 30.7 g/dL (ref 30.0–36.0)
MCV: 90.4 fL (ref 80.0–100.0)
Platelets: 620 10*3/uL — ABNORMAL HIGH (ref 150–400)
RBC: 3.03 MIL/uL — ABNORMAL LOW (ref 3.87–5.11)
RDW: 17 % — ABNORMAL HIGH (ref 11.5–15.5)
WBC: 15.8 10*3/uL — ABNORMAL HIGH (ref 4.0–10.5)
nRBC: 0 % (ref 0.0–0.2)

## 2019-10-23 LAB — GLUCOSE, CAPILLARY
Glucose-Capillary: 219 mg/dL — ABNORMAL HIGH (ref 70–99)
Glucose-Capillary: 214 mg/dL — ABNORMAL HIGH (ref 70–99)
Glucose-Capillary: 210 mg/dL — ABNORMAL HIGH (ref 70–99)
Glucose-Capillary: 197 mg/dL — ABNORMAL HIGH (ref 70–99)

## 2019-10-23 LAB — HEPARIN LEVEL (UNFRACTIONATED)
Heparin Unfractionated: 1.03 IU/mL — ABNORMAL HIGH (ref 0.30–0.70)
Heparin Unfractionated: 0.92 IU/mL — ABNORMAL HIGH (ref 0.30–0.70)
Heparin Unfractionated: 0.34 IU/mL (ref 0.30–0.70)

## 2019-10-23 MED ORDER — SODIUM CHLORIDE 0.9 % IV SOLN
1.5000 g | INTRAVENOUS | Status: AC
  Administered 2019-10-24: 1.5 g via INTRAVENOUS
  Filled 2019-10-23 (×2): qty 1.5

## 2019-10-23 NOTE — Progress Notes (Signed)
[place] for heparin Indication: h/o DVT  Allergies  Allergen Reactions  . Ambien [Zolpidem Tartrate] Other (See Comments)    Per the pt, "it makes me go crazy"  . Sulfa Antibiotics Rash  . Ativan [Lorazepam] Other (See Comments)    Patient has the opposite effect when taking medication   . Elavil [Amitriptyline Hcl] Other (See Comments)    Gastritis  . Prozac [Fluoxetine Hcl] Anxiety and Other (See Comments)    irritability     Patient Measurements: Height: 5' 7.48" (171.4 cm) Weight: 205 lb 4 oz (93.1 kg) IBW/kg (Calculated) : 62.7 Heparin Dosing Weight: 83 kg  Vital Signs: Temp: 98.9 F (37.2 C) (10/25 1132) Temp Source: Oral (10/25 1132) BP: 129/75 (10/25 1131) Pulse Rate: 90 (10/25 1131)  Labs: Recent Labs    10/21/19 0337 10/22/19 0243 10/23/19 0310 10/23/19 1213  HGB 8.4* 8.2* 8.4*  --   HCT 28.1* 27.3* 27.4*  --   PLT 661* 658* 620*  --   HEPARINUNFRC 0.68 0.31 1.03* 0.92*    Estimated Creatinine Clearance: 55.1 mL/min (A) (by C-G formula based on SCr of 1.14 mg/dL (H)).   Assessment: 68 y.o. female with h/o DVT, Eliquis on hold, on IV heparin at 1150 units/hr.  Heparin level above goal despite rate reduction earlier today.  Confirmed with patient level drawn from hand opposite to heparin infusion site.  No overt bleeding or complications noted.  Heparin clearance reduced compared to prior this admission.  Goal of Therapy:  Heparin level 0.3-0.7 units/ml  Monitor platelets by anticoagulation protocol: Yes   Plan:  Decrease Heparin 900 units/hr Check heparin level in 8 hours. Daily heparin level and CBC. BMET tomorrow AM.  [place] Crane, Roylene Reason, [facility] Clinical Pharmacist Phone [phone]  10/23/2019 1:26 PM

## 2019-10-23 NOTE — Progress Notes (Signed)
[place] for heparin Indication: h/o DVT  Allergies  Allergen Reactions  . Ambien [Zolpidem Tartrate] Other (See Comments)    Per the pt, "it makes me go crazy"  . Sulfa Antibiotics Rash  . Ativan [Lorazepam] Other (See Comments)    Patient has the opposite effect when taking medication   . Elavil [Amitriptyline Hcl] Other (See Comments)    Gastritis  . Prozac [Fluoxetine Hcl] Anxiety and Other (See Comments)    irritability     Patient Measurements: Height: 5' 7.48" (171.4 cm) Weight: 205 lb 4 oz (93.1 kg) IBW/kg (Calculated) : 62.7 Heparin Dosing Weight: 83 kg  Vital Signs: Temp: 98.4 F (36.9 C) (10/25 0242) Temp Source: Oral (10/25 0242) BP: 148/66 (10/25 0242) Pulse Rate: 98 (10/25 0242)  Labs: Recent Labs    10/21/19 0337 10/22/19 0243 10/23/19 0310  HGB 8.4* 8.2* 8.4*  HCT 28.1* 27.3* 27.4*  PLT 661* 658* 620*  HEPARINUNFRC 0.68 0.31 1.03*    Estimated Creatinine Clearance: 55.1 mL/min (A) (by C-G formula based on SCr of 1.14 mg/dL (H)).   Assessment: 68 y.o. female with h/o DVT, Eliquis on hold, for heparin  Goal of Therapy:  Heparin level 0.3-0.7 units/ml  Monitor platelets by anticoagulation protocol: Yes   Plan:  Decrease Heparin 1150 units/hr Check heparin level in 8 hours.  Phillis Knack, PharmD, BCPS   10/23/2019 5:00 AM

## 2019-10-23 NOTE — Progress Notes (Signed)
[place] for Heparin Indication: h/o DVT  Allergies  Allergen Reactions  . Ambien [Zolpidem Tartrate] Other (See Comments)    Per the pt, "it makes me go crazy"  . Sulfa Antibiotics Rash  . Ativan [Lorazepam] Other (See Comments)    Patient has the opposite effect when taking medication   . Elavil [Amitriptyline Hcl] Other (See Comments)    Gastritis  . Prozac [Fluoxetine Hcl] Anxiety and Other (See Comments)    irritability     Patient Measurements: Height: 5' 7.48" (171.4 cm) Weight: 205 lb 4 oz (93.1 kg) IBW/kg (Calculated) : 62.7 Heparin Dosing Weight: 83 kg  Vital Signs: Temp: 98.3 F (36.8 C) (10/25 1937) Temp Source: Oral (10/25 1937) BP: 115/51 (10/25 1937) Pulse Rate: 87 (10/25 1937)  Labs: Recent Labs    10/21/19 0337 10/22/19 0243 10/23/19 0310 10/23/19 1213 10/23/19 2241  HGB 8.4* 8.2* 8.4*  --   --   HCT 28.1* 27.3* 27.4*  --   --   PLT 661* 658* 620*  --   --   HEPARINUNFRC 0.68 0.31 1.03* 0.92* 0.34    Estimated Creatinine Clearance: 55.1 mL/min (A) (by C-G formula based on SCr of 1.14 mg/dL (H)).   Assessment: 68 y.o. female with h/o DVT, Eliquis on hold, on IV heparin at 1150 units/hr.  Heparin level above goal despite rate reduction earlier today.  Confirmed with patient level drawn from hand opposite to heparin infusion site.  No overt bleeding or complications noted.  Heparin clearance reduced compared to prior this admission.  10/25 PM update:  Heparin level therapeutic x 1 after rate decrease  Goal of Therapy:  Heparin level 0.3-0.7 units/ml  Monitor platelets by anticoagulation protocol: Yes   Plan:  Cont heparin at 900 units/hr Confirmatory HL with AM labs  Narda Bonds, PharmD, [place] Pharmacist Phone: [phone]

## 2019-10-23 NOTE — Progress Notes (Addendum)
[facility]  Subjective  - No new complaints eating breakfast.   Objective (!) 148/66 98 98.4 F (36.9 C) (Oral) 16 95%  Intake/Output Summary (Last 24 hours) at 10/23/2019 [id number] Last data filed at 10/23/2019 [id number] Gross per 24 hour  Intake 579.26 ml  Output 2600 ml  Net -2020.74 ml    Right groin vac in place, drainage from groin, but vac is still to suction and working Right GT amp site incision darkening, min erythema at incision line.   Right AT doppler signal intact Lungs non labored breathing  Assessment/Planning: S/P right femoral endarterectomy followed by GT amputation andright groin sartorius muscle rotational flap. Wound vac changes with groin debridement x 4.  Muscle flap appears viable with good skin edges 22 cm x 10 cm x 5 cm  Will leave wound vac in place Plan to return to the OR Tuesday for vac change we will observe if it holds until then.  I do not think bedside change is possible or that she will tolerate it.    Lauren [name] 10/23/2019 9:23 AM --  Laboratory Lab Results: Recent Labs    10/22/19 0243 10/23/19 0310  WBC 18.4* 15.8*  HGB 8.2* 8.4*  HCT 27.3* 27.4*  PLT 658* 620*   BMET No results for input(s): NA, K, CL, CO2, GLUCOSE, BUN, CREATININE, CALCIUM in the last 72 hours.  COAG Lab Results  Component Value Date   INR 1.2 10/18/2019   INR 1.2 09/29/2019   INR 1.1 09/15/2019   No results found for: PTT  I have seen and evaluated the patient. I agree with the PA note as documented above.  68 year old female status post right femoral endarterectomy and great toe amp.  Right AT signal.  Great toe looks marginal.  Ultimately had groin breakdown and required sartorius flap with multiple debridements and washouts with Dr. Donzetta Matters.  Now has very large wound.  VAC was last changed in the OR on Friday.  Her VAC has now failed and all the skin around the wound is looking very poor.  We will take her back  to the OR tomorrow for additional washout possible debridement and VAC placement.  She will not tolerate this at bedside.  Marty Heck, MD [facility] Office: [phone] Pager: [phone]

## 2019-10-24 ENCOUNTER — Encounter (HOSPITAL_COMMUNITY)
Admission: RE | Disposition: A | Discharge status: Hospice | Payer: Self-pay | Source: Home / Self Care | Attending: Vascular Surgery

## 2019-10-24 ENCOUNTER — Inpatient Hospital Stay (HOSPITAL_COMMUNITY): Payer: Medicare Other | Admitting: Certified Registered"

## 2019-10-24 HISTORY — PX: I & D EXTREMITY: SHX5045

## 2019-10-24 LAB — BASIC METABOLIC PANEL
Anion gap: 15 (ref 5–15)
BUN: 16 mg/dL (ref 8–23)
CO2: 25 mmol/L (ref 22–32)
Calcium: 8.7 mg/dL — ABNORMAL LOW (ref 8.9–10.3)
Chloride: 94 mmol/L — ABNORMAL LOW (ref 98–111)
Creatinine, Ser: 1.33 mg/dL — ABNORMAL HIGH (ref 0.44–1.00)
GFR calc Af Amer: 47 mL/min — ABNORMAL LOW (ref >60)
GFR calc non Af Amer: 41 mL/min — ABNORMAL LOW (ref >60)
Glucose, Bld: 140 mg/dL — ABNORMAL HIGH (ref 70–99)
Potassium: 3.9 mmol/L (ref 3.5–5.1)
Sodium: 134 mmol/L — ABNORMAL LOW (ref 135–145)

## 2019-10-24 LAB — GLUCOSE, CAPILLARY
Glucose-Capillary: 190 mg/dL — ABNORMAL HIGH (ref 70–99)
Glucose-Capillary: 147 mg/dL — ABNORMAL HIGH (ref 70–99)
Glucose-Capillary: 117 mg/dL — ABNORMAL HIGH (ref 70–99)
Glucose-Capillary: 121 mg/dL — ABNORMAL HIGH (ref 70–99)
Glucose-Capillary: 224 mg/dL — ABNORMAL HIGH (ref 70–99)

## 2019-10-24 LAB — HEPARIN LEVEL (UNFRACTIONATED): Heparin Unfractionated: 0.22 IU/mL — ABNORMAL LOW (ref 0.30–0.70)

## 2019-10-24 SURGERY — IRRIGATION AND DEBRIDEMENT EXTREMITY
Anesthesia: General | Site: Groin | Laterality: Right

## 2019-10-24 MED ORDER — LACTATED RINGERS IV SOLN: INTRAVENOUS | Status: DC

## 2019-10-24 MED ORDER — FENTANYL CITRATE (PF) 100 MCG/2ML IJ SOLN
INTRAMUSCULAR | Status: AC
  Administered 2019-10-24: 25 ug via INTRAVENOUS
  Filled 2019-10-24: qty 2

## 2019-10-24 MED ORDER — ONDANSETRON HCL 4 MG/2ML IJ SOLN
INTRAMUSCULAR | Status: AC
  Filled 2019-10-24: qty 2

## 2019-10-24 MED ORDER — ACETAMINOPHEN 160 MG/5ML PO SOLN: 325.0000 mg | Freq: Once | ORAL | Status: DC | PRN

## 2019-10-24 MED ORDER — ACETAMINOPHEN 10 MG/ML IV SOLN: 1000.0000 mg | Freq: Once | INTRAVENOUS | Status: DC | PRN

## 2019-10-24 MED ORDER — DIPHENHYDRAMINE HCL 25 MG PO CAPS
25.0000 mg | ORAL_CAPSULE | Freq: Once | ORAL | Status: AC
  Administered 2019-10-24: 25 mg via ORAL
  Filled 2019-10-24: qty 1

## 2019-10-24 MED ORDER — 0.9 % SODIUM CHLORIDE (POUR BTL) OPTIME
TOPICAL | Status: DC | PRN
  Administered 2019-10-24: 1000 mL

## 2019-10-24 MED ORDER — MEPERIDINE HCL 25 MG/ML IJ SOLN: 6.2500 mg | INTRAMUSCULAR | Status: DC | PRN

## 2019-10-24 MED ORDER — PROPOFOL 10 MG/ML IV BOLUS
INTRAVENOUS | Status: DC | PRN
  Administered 2019-10-24: 110 mg via INTRAVENOUS

## 2019-10-24 MED ORDER — FENTANYL CITRATE (PF) 100 MCG/2ML IJ SOLN
INTRAMUSCULAR | Status: DC | PRN
  Administered 2019-10-24 (×4): 50 ug via INTRAVENOUS

## 2019-10-24 MED ORDER — ACETAMINOPHEN 325 MG PO TABS: 325.0000 mg | ORAL_TABLET | Freq: Once | ORAL | Status: DC | PRN

## 2019-10-24 MED ORDER — MIDAZOLAM HCL 2 MG/2ML IJ SOLN
INTRAMUSCULAR | Status: AC
  Filled 2019-10-24: qty 2

## 2019-10-24 MED ORDER — LIDOCAINE 2% (20 MG/ML) 5 ML SYRINGE
INTRAMUSCULAR | Status: DC | PRN
  Administered 2019-10-24: 80 mg via INTRAVENOUS

## 2019-10-24 MED ORDER — MIDAZOLAM HCL 5 MG/5ML IJ SOLN
INTRAMUSCULAR | Status: DC | PRN
  Administered 2019-10-24: 2 mg via INTRAVENOUS

## 2019-10-24 MED ORDER — FENTANYL CITRATE (PF) 250 MCG/5ML IJ SOLN
INTRAMUSCULAR | Status: AC
  Filled 2019-10-24: qty 5

## 2019-10-24 MED ORDER — PROMETHAZINE HCL 25 MG/ML IJ SOLN: 6.2500 mg | INTRAMUSCULAR | Status: DC | PRN

## 2019-10-24 MED ORDER — FENTANYL CITRATE (PF) 100 MCG/2ML IJ SOLN
25.0000 ug | INTRAMUSCULAR | Status: DC | PRN
  Administered 2019-10-24: 17:00:00 25 ug via INTRAVENOUS

## 2019-10-24 MED ORDER — ARTIFICIAL TEARS OPHTHALMIC OINT
TOPICAL_OINTMENT | OPHTHALMIC | Status: AC
  Filled 2019-10-24: qty 3.5

## 2019-10-24 MED ORDER — LIDOCAINE 2% (20 MG/ML) 5 ML SYRINGE
INTRAMUSCULAR | Status: AC
  Filled 2019-10-24: qty 5

## 2019-10-24 MED ORDER — DIPHENHYDRAMINE HCL 25 MG PO CAPS
25.0000 mg | ORAL_CAPSULE | Freq: Four times a day (QID) | ORAL | Status: DC | PRN
  Administered 2019-10-24 – 2019-11-05 (×8): 25 mg via ORAL
  Filled 2019-10-24 (×9): qty 1

## 2019-10-24 MED ORDER — PROPOFOL 10 MG/ML IV BOLUS
INTRAVENOUS | Status: AC
  Filled 2019-10-24: qty 20

## 2019-10-24 MED ORDER — PHENYLEPHRINE HCL (PRESSORS) 10 MG/ML IV SOLN
INTRAVENOUS | Status: DC | PRN
  Administered 2019-10-24 (×2): 80 ug via INTRAVENOUS

## 2019-10-24 SURGICAL SUPPLY — 41 items
ADH SKN CLS APL DERMABOND .7 (GAUZE/BANDAGES/DRESSINGS) ×2
BNDG ELASTIC 4X5.8 VLCR STR LF (GAUZE/BANDAGES/DRESSINGS) IMPLANT
BNDG ELASTIC 6X5.8 VLCR STR LF (GAUZE/BANDAGES/DRESSINGS) IMPLANT
BNDG GAUZE ELAST 4 BULKY (GAUZE/BANDAGES/DRESSINGS) IMPLANT
CANISTER SUCT 3000ML PPV (MISCELLANEOUS) ×3 IMPLANT
COVER SURGICAL LIGHT HANDLE (MISCELLANEOUS) ×6 IMPLANT
COVER WAND RF STERILE (DRAPES) ×3 IMPLANT
DERMABOND ADVANCED (GAUZE/BANDAGES/DRESSINGS) ×1
DERMABOND ADVANCED .7 DNX12 (GAUZE/BANDAGES/DRESSINGS) ×2 IMPLANT
DRAPE INCISE IOBAN 66X45 STRL (DRAPES) ×3 IMPLANT
DRAPE ORTHO SPLIT 77X108 STRL (DRAPES) ×3
DRAPE SURG ORHT 6 SPLT 77X108 (DRAPES) ×2 IMPLANT
DRSG ADAPTIC 3X8 NADH LF (GAUZE/BANDAGES/DRESSINGS) ×2 IMPLANT
DRSG VAC ATS LRG SENSATRAC (GAUZE/BANDAGES/DRESSINGS) ×6 IMPLANT
DRSG VAC ATS MED SENSATRAC (GAUZE/BANDAGES/DRESSINGS) IMPLANT
ELECT REM PT RETURN 9FT ADLT (ELECTROSURGICAL) ×3
ELECTRODE REM PT RTRN 9FT ADLT (ELECTROSURGICAL) ×2 IMPLANT
GAUZE SPONGE 4X4 12PLY STRL (GAUZE/BANDAGES/DRESSINGS) ×3 IMPLANT
GLOVE BIO SURGEON STRL SZ7.5 (GLOVE) ×3 IMPLANT
GLOVE BIOGEL PI IND STRL 8 (GLOVE) ×2 IMPLANT
GLOVE BIOGEL PI INDICATOR 8 (GLOVE) ×1
GOWN STRL REUS W/ TWL LRG LVL3 (GOWN DISPOSABLE) ×4 IMPLANT
GOWN STRL REUS W/ TWL XL LVL3 (GOWN DISPOSABLE) ×4 IMPLANT
GOWN STRL REUS W/TWL LRG LVL3 (GOWN DISPOSABLE) ×6
GOWN STRL REUS W/TWL XL LVL3 (GOWN DISPOSABLE) ×6
KIT BASIN OR (CUSTOM PROCEDURE TRAY) ×3 IMPLANT
KIT TURNOVER KIT B (KITS) ×3 IMPLANT
NS IRRIG 1000ML POUR BTL (IV SOLUTION) ×3 IMPLANT
PACK CV ACCESS (CUSTOM PROCEDURE TRAY) ×3 IMPLANT
PACK GENERAL/GYN (CUSTOM PROCEDURE TRAY) ×3 IMPLANT
PAD ARMBOARD 7.5X6 YLW CONV (MISCELLANEOUS) ×6 IMPLANT
STAPLER VISISTAT 35W (STAPLE) IMPLANT
SUT ETHILON 2 0 PSLX (SUTURE) IMPLANT
SUT ETHILON 3 0 PS 1 (SUTURE) IMPLANT
SUT MNCRL AB 4-0 PS2 18 (SUTURE) IMPLANT
SUT VIC AB 2-0 CT1 27 (SUTURE)
SUT VIC AB 2-0 CT1 TAPERPNT 27 (SUTURE) IMPLANT
SUT VIC AB 3-0 SH 27 (SUTURE)
SUT VIC AB 3-0 SH 27X BRD (SUTURE) IMPLANT
TOWEL GREEN STERILE (TOWEL DISPOSABLE) ×3 IMPLANT
WATER STERILE IRR 1000ML POUR (IV SOLUTION) ×3 IMPLANT

## 2019-10-24 NOTE — Anesthesia Preprocedure Evaluation (Signed)
Anesthesia Evaluation  Patient identified by MRN, date of birth, ID band Patient awake    Reviewed: Allergy & Precautions, NPO status , Patient's Chart, lab work & pertinent test results  Airway Mallampati: II  TM Distance: >3 FB Neck ROM: Full    Dental  (+) Teeth Intact, Dental Advisory Given   Pulmonary COPD, former smoker,    breath sounds clear to auscultation       Cardiovascular hypertension, + CAD, + Peripheral Vascular Disease and +CHF   Rhythm:Regular Rate:Normal     Neuro/Psych Anxiety Depression TIACVA    GI/Hepatic Neg liver ROS, GERD  ,  Endo/Other  diabetes  Renal/GU      Musculoskeletal   Abdominal (+) + obese,   Peds  Hematology  (+) anemia ,   Anesthesia Other Findings   Reproductive/Obstetrics                             Anesthesia Physical Anesthesia Plan  ASA: III  Anesthesia Plan: General   Post-op Pain Management:    Induction: Intravenous  PONV Risk Score and Plan: 4 or greater and Ondansetron, Treatment may vary due to age or medical condition and Midazolam  Airway Management Planned: LMA  Additional Equipment: None  Intra-op Plan:   Post-operative Plan: Extubation in OR  Informed Consent: I have reviewed the patients History and Physical, chart, labs and discussed the procedure including the risks, benefits and alternatives for the proposed anesthesia with the patient or authorized representative who has indicated his/her understanding and acceptance.       Plan Discussed with: CRNA  Anesthesia Plan Comments:         Anesthesia Quick Evaluation

## 2019-10-24 NOTE — Care Management Important Message (Signed)
Important Message  Patient Details  Name: JAKYA HARGRETT MRN: [id number] Date of Birth: 04/04/1951   Medicare Important Message Given:  Yes     [name] 10/24/2019, 1:15 PM

## 2019-10-24 NOTE — Progress Notes (Signed)
[facility] for heparin Indication: h/o DVT  Allergies  Allergen Reactions  . Ambien [Zolpidem Tartrate] Other (See Comments)    Per the pt, "it makes me go crazy"  . Sulfa Antibiotics Rash  . Ativan [Lorazepam] Other (See Comments)    Patient has the opposite effect when taking medication   . Elavil [Amitriptyline Hcl] Other (See Comments)    Gastritis  . Prozac [Fluoxetine Hcl] Anxiety and Other (See Comments)    irritability     Patient Measurements: Height: 5' 7.48" (171.4 cm) Weight: 205 lb 4 oz (93.1 kg) IBW/kg (Calculated) : 62.7 Heparin Dosing Weight: 83 kg  Vital Signs: Temp: 98.6 F (37 C) (10/26 0900) Temp Source: Oral (10/26 0900) BP: 148/118 (10/26 0900) Pulse Rate: 91 (10/26 0900)  Labs: Recent Labs    10/22/19 0243 10/23/19 0310 10/23/19 1213 10/23/19 2241 10/24/19 0306  HGB 8.2* 8.4*  --   --   --   HCT 27.3* 27.4*  --   --   --   PLT 658* 620*  --   --   --   HEPARINUNFRC 0.31 1.03* 0.92* 0.34 0.22*  CREATININE  --   --   --   --  1.33*    Estimated Creatinine Clearance: 47.2 mL/min (A) (by C-G formula based on SCr of 1.33 mg/dL (H)).   Assessment: 21 yoF on Eliquis PTA for hx DVT, last dose was 10/14pm and pt has been transitioned to heparin given multiple procedures needed and wound vac in place.  Heparin level subtherapeutic this morning at 0.22, H/H stable.  Goal of Therapy:  Heparin level 0.3-0.7 units/ml  Monitor platelets by anticoagulation protocol: Yes   Plan:  -Increase heparin to 1000 units/hr -Recheck heparin level with daily labs   Arrie Senate, PharmD, BCPS Clinical Pharmacist [phone] Please check AMION for all [facility] numbers 10/24/2019

## 2019-10-24 NOTE — Progress Notes (Signed)
  Progress Note    10/24/2019 12:49 PM 3 Days Post-Op  Subjective: no overnight issues  Vitals:   10/24/19 0710 10/24/19 0900  BP:  (!) 148/118  Pulse: 92 91  Resp: 16 17  Temp:  98.6 F (37 C)  SpO2: 96% 97%    Physical Exam: aaox3 Right groin not examined due to pain Strong right pt signal, great toe amp site healing with sutures in place  CBC    Component Value Date/Time   WBC 15.8 (H) 10/23/2019 0310   RBC 3.03 (L) 10/23/2019 0310   HGB 8.4 (L) 10/23/2019 0310   HGB 11.2 01/28/2018 1035   HGB 12.0 09/29/2011 1016   HCT 27.4 (L) 10/23/2019 0310   HCT 34.4 01/28/2018 1035   HCT 35.3 09/29/2011 1016   PLT 620 (H) 10/23/2019 0310   PLT 384 (H) 01/28/2018 1035   MCV 90.4 10/23/2019 0310   MCV 88 01/28/2018 1035   MCV 88.0 09/29/2011 1016   MCH 27.7 10/23/2019 0310   MCHC 30.7 10/23/2019 0310   RDW 17.0 (H) 10/23/2019 0310   RDW 17.6 (H) 01/28/2018 1035   RDW 14.8 (H) 09/29/2011 1016   LYMPHSABS 1.6 10/14/2019 0529   LYMPHSABS 0.6 (L) 09/29/2011 1016   MONOABS 1.7 (H) 10/14/2019 0529   MONOABS 0.8 09/29/2011 1016   EOSABS 0.1 10/14/2019 0529   EOSABS 0.3 09/29/2011 1016   BASOSABS 0.1 10/14/2019 0529   BASOSABS 0.1 09/29/2011 1016    BMET    Component Value Date/Time   NA 134 (L) 10/24/2019 0306   NA 142 01/28/2018 1035   K 3.9 10/24/2019 0306   CL 94 (L) 10/24/2019 0306   CO2 25 10/24/2019 0306   GLUCOSE 140 (H) 10/24/2019 0306   BUN 16 10/24/2019 0306   BUN 19 01/28/2018 1035   BUN 14.7 07/18/2014 1142   CREATININE 1.33 (H) 10/24/2019 0306   CREATININE 1.0 07/18/2014 1142   CALCIUM 8.7 (L) 10/24/2019 0306   GFRNONAA 41 (L) 10/24/2019 0306   GFRAA 47 (L) 10/24/2019 0306    INR    Component Value Date/Time   INR 1.2 10/18/2019 0332     Intake/Output Summary (Last 24 hours) at 10/24/2019 1249 Last data filed at 10/24/2019 0900 Gross per 24 hour  Intake 574.39 ml  Output 1950 ml  Net -1375.61 ml     Assessment/plan:  68 y.o.  female is here with post surgical groin wound s/p muscle flap and multiple debridements. Plan to return to OR today for exam under anesthesia.    Deny Chevez C. Donzetta Matters, MD [facility] Office: [phone] Pager: [phone]  10/24/2019 12:49 PM

## 2019-10-24 NOTE — Anesthesia Procedure Notes (Signed)
Procedure Name: LMA Insertion Date/Time: 10/24/2019 3:19 PM Performed by: Amadeo Garnet, CRNA Pre-anesthesia Checklist: Patient identified, Emergency Drugs available, Suction available, Timeout performed and Patient being monitored Patient Re-evaluated:Patient Re-evaluated prior to induction Oxygen Delivery Method: Circle system utilized Preoxygenation: Pre-oxygenation with 100% oxygen Induction Type: IV induction Ventilation: Mask ventilation without difficulty LMA: LMA inserted LMA Size: 4.0 Number of attempts: 1 Dental Injury: Teeth and Oropharynx as per pre-operative assessment

## 2019-10-24 NOTE — Op Note (Signed)
    Patient name: Lauren [name] MRN: [id number] DOB: May 27, 1951 Sex: female  10/24/2019 Pre-operative Diagnosis: Right groin postsurgical wound Post-operative diagnosis:  Same Surgeon:  Erlene Quan C. Donzetta Matters, MD Procedure Performed:  Hervey Ard excisional debridement right groin wound skin and subcutaneous tissue to 22 x 10 cm  Indications: 68 year old female has a significant right groin wound.  She has sartorius muscle flap there.  She is indicated for debridement possible placement of wound VAC.  Findings: There is a beginning of granulation tissue.  The edges had some necrosis was debrided back to healthy bleeding tissue.  A wet-to-dry dressing was placed.   Procedure:  The patient was identified in the holding area and taken to the operating room where she was put supine operative table.  Antibiotics were administered.  General anesthesia was induced.  Timeout was called.  She sterilely prepped and draped in the right groin usual fashion.  I evaluated the wound.  Skin edges most cephalad were debrided back to healthy-appearing tissue and it was 22 x 10 cm in size.  Given the breakdown of the skin around the wound I elected to place a wet-to-dry which can be changed at the bedside.  This was done with a single Kerlix and dry dressing was placed around it.  She is awake and anesthesia having tolerated procedure well immediate complication.  EBL: 25 cc    Lauren [name] C. Donzetta Matters, MD [facility] Office: [phone] Pager: [phone]

## 2019-10-24 NOTE — Progress Notes (Signed)
PT Cancellation Note  Patient Details Name: Lauren [name] MRN: [id number] DOB: 10-Jan-1951   Cancelled Treatment:    Reason Eval/Treat Not Completed: Patient at procedure or test/unavailable. Pt off floor at OR for exam of muscle flap. PT will attempt to follow when pt is medically appropriate.   Zenaida Niece 10/24/2019, 3:23 PM

## 2019-10-24 NOTE — Transfer of Care (Signed)
Immediate Anesthesia Transfer of Care Note  Patient: Lauren [name]  Procedure(s) Performed: IRRIGATION AND DEBRIDEMENT GROIN (Right Groin)  Patient Location: PACU  Anesthesia Type:General  Level of Consciousness: awake, alert  and oriented  Airway & Oxygen Therapy: Patient Spontanous Breathing  Post-op Assessment: Report given to RN, Post -op Vital signs reviewed and stable and Patient moving all extremities  Post vital signs: Reviewed and stable  Last Vitals:  Vitals Value Taken Time  BP 124/72 10/24/19 1617  Temp    Pulse 95 10/24/19 1621  Resp 12 10/24/19 1621  SpO2 97 % 10/24/19 1621  Vitals shown include unvalidated device data.  Last Pain:  Vitals:   10/24/19 0900  TempSrc: Oral  PainSc: 0-No pain      Patients Stated Pain Goal: 0 ([id number])  Complications: No apparent anesthesia complications

## 2019-10-25 LAB — GLUCOSE, CAPILLARY
Glucose-Capillary: 237 mg/dL — ABNORMAL HIGH (ref 70–99)
Glucose-Capillary: 210 mg/dL — ABNORMAL HIGH (ref 70–99)
Glucose-Capillary: 142 mg/dL — ABNORMAL HIGH (ref 70–99)
Glucose-Capillary: 135 mg/dL — ABNORMAL HIGH (ref 70–99)

## 2019-10-25 LAB — HEPARIN LEVEL (UNFRACTIONATED): Heparin Unfractionated: 0.25 IU/mL — ABNORMAL LOW (ref 0.30–0.70)

## 2019-10-25 NOTE — Anesthesia Postprocedure Evaluation (Signed)
Anesthesia Post Note  Patient: Lauren [name]  Procedure(s) Performed: IRRIGATION AND DEBRIDEMENT GROIN (Right Groin)     Patient location during evaluation: PACU Anesthesia Type: General Level of consciousness: awake and alert Pain management: pain level controlled Vital Signs Assessment: post-procedure vital signs reviewed and stable Respiratory status: spontaneous breathing, nonlabored ventilation, respiratory function stable and patient connected to nasal cannula oxygen Cardiovascular status: blood pressure returned to baseline and stable Postop Assessment: no apparent nausea or vomiting Anesthetic complications: no    Last Vitals:  Vitals:   10/25/19 1008 10/25/19 1333  BP: 110/83 123/66  Pulse: (!) 103 88  Resp:  14  Temp:  36.8 C  SpO2:  97%    Last Pain:  Vitals:   10/25/19 1333  TempSrc: Oral  PainSc:                  Effie Berkshire

## 2019-10-25 NOTE — Progress Notes (Addendum)
[facility]  Subjective  - Painful right groin   Objective (!) 124/53 99 98.7 F (37.1 C) (Oral) 16 92%  Intake/Output Summary (Last 24 hours) at 10/25/2019 0734 Last data filed at 10/24/2019 1620 Gross per 24 hour  Intake -  Output 821 ml  Net -821 ml    Wet to dry keflex Abd and telfa to protect skin Wound base appears viable, skin erosion above the incisional wound.    Assessment/Planning: S/P right femoral endarterectomy followed by GT amputation andright groin sartorius muscle rotational flap. Wound vac changes with groin debridement x 4.  Muscle flap appears viable with good skin edges22 cm x 10 cm x 5 cm  Wound vac discontinued now wet to dry daily changes with Kerlex, ABD and Telfa non adherent dressing to protect her abdomin.   Lauren [name] 10/25/2019 7:34 AM --  Laboratory Lab Results: Recent Labs    10/23/19 0310  WBC 15.8*  HGB 8.4*  HCT 27.4*  PLT 620*   BMET Recent Labs    10/24/19 0306  NA 134*  K 3.9  CL 94*  CO2 25  GLUCOSE 140*  BUN 16  CREATININE 1.33*  CALCIUM 8.7*    COAG Lab Results  Component Value Date   INR 1.2 10/18/2019   INR 1.2 09/29/2019   INR 1.1 09/15/2019   No results found for: PTT    I have interviewed and examined patient with PA and agree with assessment and plan above.   Para Cossey C. Donzetta Matters, MD [facility] Office: [phone] Pager: [phone]

## 2019-10-25 NOTE — Progress Notes (Signed)
Physical Therapy Treatment Patient Details Name: Lauren [name] MRN: [id number] DOB: 06-Sep-1951 Today's Date: 10/25/2019    History of Present Illness Pt is a 68 y.o. female admitted 09/29/19 with worsening pain R foot and great toe. S/p bilateral SFA endarterectomies 10/1. Now s/p RCFEA now with wound debridement and great toe amp 10/13. S/p R groin I&D and sartoirus muscle flap with wound vac placed 10/15. To OR for additional groin I&D on 10/20. s/p multiple I&Ds of right groin 10/23, 10/26. PMH includes HTN, HLD, COPD, DM, CAD.    PT Comments    Patient progressing slowly towards PT goals. Requires assist of 2 for bed mobility. Tolerated sitting EOB working on core/trunk activation, lateral flexors and dynamic sitting balance for ~15 minutes. Supine BP 145/67, sitting BP 120/99 with mild dizziness which resolved. Pt seems to be pleasantly confused- saying things unrelated to topic. Pt able to be redirected with cues. Recommend nursing transfer pt OOB to chair via maximove daily. Will continue to follow and progress as tolerated.   Follow Up Recommendations  SNF;Supervision for mobility/OOB     Equipment Recommendations  Other (comment)(defer to next venue)    Recommendations for Other Services       Precautions / Restrictions Precautions Precautions: Fall Restrictions Weight Bearing Restrictions: No Other Position/Activity Restrictions: WB through R heel, L orthotic and shoe for OOB    Mobility  Bed Mobility Overal bed mobility: Needs Assistance Bed Mobility: Supine to Sit;Sit to Sidelying;Rolling Rolling: +2 for physical assistance;Mod assist   Supine to sit: Max assist;+2 for physical assistance;HOB elevated   Sit to sidelying: Max assist;+2 for physical assistance;HOB elevated General bed mobility comments: Assist with RLE, bottom and to elevate trunk to get to EOB; assist to bring BLEs into to return to supine. Rolling to right/left to adjust pads with ModA of 2.  Able to assist with UEs minimally with cues.  Transfers                 General transfer comment: Deferred as pt sliding off bed/safety so returned to supine  Ambulation/Gait                 Stairs             Wheelchair Mobility    Modified Rankin (Stroke Patients Only)       Balance Overall balance assessment: Needs assistance Sitting-balance support: Feet supported;No upper extremity supported Sitting balance-Leahy Scale: Poor Sitting balance - Comments: Initially required Mod A progressing to supervision wtih UE support. Worked on core/trunk activation reaching outside [employer] with both UEs ina ll directions. Performed mini crunches seated with assist x15. Postural control: Posterior lean                                  Cognition Arousal/Alertness: Awake/alert Behavior During Therapy: WFL for tasks assessed/performed Overall Cognitive Status: No family/caregiver present to determine baseline cognitive functioning                                 General Comments: Able to be easily redirected to task with cues. States things that are off topic and unrelated. "I'll pretend I am holding and petting a kitty."      Exercises      General Comments General comments (skin integrity, edema, etc.): Limited knee flexion bilaterally. Swelling present RLE.  Pertinent Vitals/Pain Pain Assessment: Faces Faces Pain Scale: Hurts even more Pain Location: Rt groin/abdomen with mobility Pain Descriptors / Indicators: Grimacing;Guarding;Sore Pain Intervention(s): Monitored during session;Repositioned;Premedicated before session    Home Living                      Prior Function            PT Goals (current goals can now be found in the care plan section) Progress towards PT goals: Progressing toward goals(slowly)    Frequency    Min 2X/week      PT Plan Current plan remains appropriate    Co-evaluation               AM-PAC PT "6 Clicks" Mobility   Outcome Measure  Help needed turning from your back to your side while in a flat bed without using bedrails?: A Lot Help needed moving from lying on your back to sitting on the side of a flat bed without using bedrails?: A Lot Help needed moving to and from a bed to a chair (including a wheelchair)?: Total Help needed standing up from a chair using your arms (e.g., wheelchair or bedside chair)?: Total Help needed to walk in hospital room?: Total Help needed climbing 3-5 steps with a railing? : Total 6 Click Score: 8    End of Session Equipment Utilized During Treatment: Gait belt Activity Tolerance: Patient tolerated treatment well;Patient limited by pain Patient left: in bed;with call bell/phone within reach;with bed alarm set Nurse Communication: Mobility status;Need for lift equipment PT Visit Diagnosis: Muscle weakness (generalized) (M62.81);Unsteadiness on feet (R26.81);Pain;Other abnormalities of gait and mobility (R26.89) Pain - Right/Left: Right Pain - part of body: Leg     Time: [id number] PT Time Calculation (min) (ACUTE ONLY): 24 min  Charges:  $Therapeutic Activity: 8-22 mins $Neuromuscular Re-education: 8-22 mins                     Wray Kearns, PT, DPT Acute Rehabilitation Services Pager [phone] Office [place] 10/25/2019, 12:55 PM

## 2019-10-25 NOTE — Progress Notes (Signed)
[place] [facility] for heparin Indication: h/o DVT  Allergies  Allergen Reactions  . Ambien [Zolpidem Tartrate] Other (See Comments)    Per the pt, "it makes me go crazy"  . Sulfa Antibiotics Rash  . Ativan [Lorazepam] Other (See Comments)    Patient has the opposite effect when taking medication   . Elavil [Amitriptyline Hcl] Other (See Comments)    Gastritis  . Prozac [Fluoxetine Hcl] Anxiety and Other (See Comments)    irritability     Patient Measurements: Height: 5' 7.48" (171.4 cm) Weight: 205 lb 4 oz (93.1 kg) IBW/kg (Calculated) : 62.7 Heparin Dosing Weight: 83 kg  Vital Signs: Temp: 98.7 F (37.1 C) (10/26 2024) Temp Source: Oral (10/26 2024) BP: 124/53 (10/26 2024) Pulse Rate: 99 (10/26 2030)  Labs: Recent Labs    10/23/19 0310  10/23/19 2241 10/24/19 0306 10/25/19 0344  HGB 8.4*  --   --   --   --   HCT 27.4*  --   --   --   --   PLT 620*  --   --   --   --   HEPARINUNFRC 1.03*   < > 0.34 0.22* 0.25*  CREATININE  --   --   --  1.33*  --    < > = values in this interval not displayed.    Estimated Creatinine Clearance: 47.2 mL/min (A) (by C-G formula based on SCr of 1.33 mg/dL (H)).   Assessment: 36 yoF on Eliquis PTA for hx DVT, last dose was 10/14pm and pt has been transitioned to heparin given multiple procedures needed and wound vac in place.  Heparin level subtherapeutic this morning at 0.25, no CBC this morning, no S/Sx bleeding documented. Pt s/p OR for debridement again yesterday.  Goal of Therapy:  Heparin level 0.3-0.7 units/ml  Monitor platelets by anticoagulation protocol: Yes   Plan:  -Increase heparin to 1150 units/hr -Heparin level and CBC in am   Arrie Senate, PharmD, BCPS Clinical Pharmacist [phone] Please check AMION for all [facility] numbers 10/25/2019

## 2019-10-26 ENCOUNTER — Encounter (HOSPITAL_COMMUNITY): Payer: Self-pay | Admitting: Vascular Surgery

## 2019-10-26 DIAGNOSIS — L8990 Pressure ulcer of unspecified site, unspecified stage: Secondary | ICD-10-CM | POA: Insufficient documentation

## 2019-10-26 LAB — GLUCOSE, CAPILLARY
Glucose-Capillary: 146 mg/dL — ABNORMAL HIGH (ref 70–99)
Glucose-Capillary: 97 mg/dL (ref 70–99)
Glucose-Capillary: 87 mg/dL (ref 70–99)
Glucose-Capillary: 92 mg/dL (ref 70–99)

## 2019-10-26 LAB — CBC
HCT: 24.4 % — ABNORMAL LOW (ref 36.0–46.0)
Hemoglobin: 7.4 g/dL — ABNORMAL LOW (ref 12.0–15.0)
MCH: 27.5 pg (ref 26.0–34.0)
MCHC: 30.3 g/dL (ref 30.0–36.0)
MCV: 90.7 fL (ref 80.0–100.0)
Platelets: 583 10*3/uL — ABNORMAL HIGH (ref 150–400)
RBC: 2.69 MIL/uL — ABNORMAL LOW (ref 3.87–5.11)
RDW: 16.7 % — ABNORMAL HIGH (ref 11.5–15.5)
WBC: 15 10*3/uL — ABNORMAL HIGH (ref 4.0–10.5)
nRBC: 0 % (ref 0.0–0.2)

## 2019-10-26 LAB — HEPARIN LEVEL (UNFRACTIONATED)
Heparin Unfractionated: 0.51 IU/mL (ref 0.30–0.70)
Heparin Unfractionated: 0.48 IU/mL (ref 0.30–0.70)

## 2019-10-26 MED ORDER — FERROUS SULFATE 325 (65 FE) MG PO TABS
325.0000 mg | ORAL_TABLET | Freq: Every day | ORAL | Status: DC
  Administered 2019-10-27 – 2019-11-17 (×22): 325 mg via ORAL
  Filled 2019-10-26 (×22): qty 1

## 2019-10-26 MED ORDER — ENSURE MAX PROTEIN PO LIQD
11.0000 [oz_av] | Freq: Two times a day (BID) | ORAL | Status: DC
  Administered 2019-10-26 – 2019-10-29 (×3): 11 [oz_av] via ORAL
  Filled 2019-10-26 (×14): qty 330

## 2019-10-26 MED ORDER — CIPROFLOXACIN HCL 500 MG PO TABS
500.0000 mg | ORAL_TABLET | Freq: Two times a day (BID) | ORAL | Status: DC
  Administered 2019-10-26 – 2019-11-04 (×19): 500 mg via ORAL
  Filled 2019-10-26 (×19): qty 1

## 2019-10-26 MED ORDER — DAKINS (FULL STRENGTH) SOLUTION 0.5%
Freq: Two times a day (BID) | CUTANEOUS | Status: DC
  Administered 2019-10-27 – 2019-11-01 (×9)
  Administered 2019-11-01 – 2019-11-02 (×2): 1
  Administered 2019-11-02 – 2019-11-04 (×4)
  Administered 2019-11-04: 1
  Administered 2019-11-05 (×2)
  Administered 2019-11-06: 1
  Administered 2019-11-06 – 2019-11-10 (×8)
  Administered 2019-11-11: 1
  Administered 2019-11-11 – 2019-11-16 (×10)
  Filled 2019-10-26 (×9): qty 1000

## 2019-10-26 MED ORDER — TAB-A-VITE/IRON PO TABS
1.0000 | ORAL_TABLET | Freq: Every day | ORAL | Status: DC
  Administered 2019-10-27 – 2019-11-17 (×22): 1 via ORAL
  Filled 2019-10-26 (×22): qty 1

## 2019-10-26 NOTE — Progress Notes (Signed)
Occupational Therapy Treatment Patient Details Name: Lauren [name] MRN: [id number] DOB: 08/18/1951 Today's Date: 10/26/2019    History of present illness Pt is a 68 y.o. female admitted 09/29/19 with worsening pain R foot and great toe. S/p bilateral SFA endarterectomies 10/1. Now s/p RCFEA now with wound debridement and great toe amp 10/13. S/p R groin I&D and sartoirus muscle flap with wound vac placed 10/15. To OR for additional groin I&D on 10/20. s/p multiple I&Ds of right groin 10/23, 10/26. PMH includes HTN, HLD, COPD, DM, CAD.   OT comments  Pt making slow progress towards acute OT goals. Focus of session was BUE exercises for general UB strengthening. Pt noted to be briefly agitated at times. Frequent nonsensical and tangential responses noted. D/c plan remains appropriate.    Follow Up Recommendations  SNF;Supervision/Assistance - 24 hour    Equipment Recommendations  Other (comment)(TBD next venue of care)    Recommendations for Other Services      Precautions / Restrictions Precautions Precautions: Fall Precaution Comments: R groin wound vac Restrictions Weight Bearing Restrictions: No       Mobility Bed Mobility                  Transfers                      Balance                                           ADL either performed or assessed with clinical judgement   ADL Overall ADL's : Needs assistance/impaired Eating/Feeding: Set up;Bed level Eating/Feeding Details (indicate cue type and reason): able to bring cup to mouth. generalized weakness noted. Pt noted to have difficulty steadying cup to place back on tray table.                                   General ADL Comments: seen at bed level. BUE HEP initiated.     Vision       Perception     Praxis      Cognition Arousal/Alertness: Awake/alert Behavior During Therapy: Agitated Overall Cognitive Status: No family/caregiver present to  determine baseline cognitive functioning Area of Impairment: Orientation;Attention;Memory;Following commands;Safety/judgement;Awareness;Problem solving                 Orientation Level: Place(Correct on second attempt. First: "[facility]") Current Attention Level: Sustained Memory: Decreased short-term memory Following Commands: Follows one step commands with increased time Safety/Judgement: Decreased awareness of deficits;Decreased awareness of safety Awareness: Intellectual Problem Solving: Slow processing;Difficulty sequencing General Comments: Nonsensical speech at times. "What are you doing with the elderly people?" "What about the horses?"  Pt stating at one point, "I'm out of it (ie confused)"        Exercises Exercises: General Upper Extremity;Other exercises Other Exercises Other Exercises: Level 1 theraband affixed to each bed rail for BUE exercises. Pt completed 3 reps. Cues needed for attention to task and redirection. Also provided handheld therapy squeeze ball and instructed in use for working on grip strength.    Shoulder Instructions       General Comments      Pertinent Vitals/ Pain       Pain Assessment: Faces Faces Pain Scale: Hurts even more Pain Location: "everywhere" Pain Descriptors /  Indicators: Grimacing;Guarding;Sore Pain Intervention(s): Monitored during session;Repositioned;Limited activity within patient's tolerance  Home Living                                          Prior Functioning/Environment              Frequency  Min 2X/week        Progress Toward Goals  OT Goals(current goals can now be found in the care plan section)  Progress towards OT goals: Progressing toward goals(slow progress)  Acute Rehab OT Goals Patient Stated Goal: I want to go home with my husband OT Goal Formulation: With patient Time For Goal Achievement: 10/28/19 Potential to Achieve Goals: Good ADL Goals Pt Will Transfer to  Toilet: with min assist;stand pivot transfer;bedside commode;regular height toilet Pt Will Perform Toileting - Clothing Manipulation and hygiene: with min assist;sitting/lateral leans;sit to/from stand Pt Will Perform Tub/Shower Transfer: with min assist;Stand pivot transfer;shower seat;grab bars Additional ADL Goal #1: Pt will complete bed mobility at min A level in preparation for safe BADL engagement  Plan Discharge plan remains appropriate    Co-evaluation                 AM-PAC OT "6 Clicks" Daily Activity     Outcome Measure   Help from another person eating meals?: A Little Help from another person taking care of personal grooming?: A Little Help from another person toileting, which includes using toliet, bedpan, or urinal?: Total Help from another person bathing (including washing, rinsing, drying)?: A Lot Help from another person to put on and taking off regular upper body clothing?: A Little Help from another person to put on and taking off regular lower body clothing?: Total 6 Click Score: 13    End of Session    OT Visit Diagnosis: Other abnormalities of gait and mobility (R26.89);Muscle weakness (generalized) (M62.81);Pain Pain - Right/Left: Right Pain - part of body: Leg   Activity Tolerance Patient limited by fatigue;Patient limited by pain;Treatment limited secondary to agitation   Patient Left in bed;with call bell/phone within reach   Nurse Communication          Time: [id number] OT Time Calculation (min): 16 min  Charges: OT General Charges $OT Visit: 1 Visit OT Treatments $Therapeutic Exercise: 8-22 mins  Tyrone Schimke, OT Acute Rehabilitation Services Pager: [phone] Office: [phone]    Hortencia Pilar 10/26/2019, 1:05 PM

## 2019-10-26 NOTE — Progress Notes (Addendum)
[facility]  Subjective  - Tolerated dressing changes today.   Objective (!) 108/51 92 97.8 F (36.6 C) (Oral) 17 95%  Intake/Output Summary (Last 24 hours) at 10/26/2019 0743 Last data filed at 10/26/2019 0512 Gross per 24 hour  Intake 360 ml  Output 1775 ml  Net -1415 ml    Right groin wet to dry dressing removed with greenish drainage. Dressing replaced. Right PT doppler signal, GT toe amp. Healing Lungs non labored breathing  Assessment/Planning: S/P right femoral endarterectomy followed by GT amputation andright groin sartorius muscle rotational flap. Wound vac changes with groin debridement x 4.  Muscle flap appears viable with good skin edges22 cm x 10 cm x 5 cm  Will start wet to dry with Dakins solution for the Pseudomonas appearing drainage.   Will have RN change dressing again this afternoon.   Once she is tolerating dressing changes she can be discharged to a SNF that will be diligent at dressing care.  Roxy Horseman 10/26/2019 7:43 AM --  Laboratory Lab Results: Recent Labs    10/26/19 0306  WBC 15.0*  HGB 7.4*  HCT 24.4*  PLT 583*   BMET Recent Labs    10/24/19 0306  NA 134*  K 3.9  CL 94*  CO2 25  GLUCOSE 140*  BUN 16  CREATININE 1.33*  CALCIUM 8.7*    COAG Lab Results  Component Value Date   INR 1.2 10/18/2019   INR 1.2 09/29/2019   INR 1.1 09/15/2019   No results found for: PTT    I have interviewed and examined patient with PA and agree with assessment and plan above.  She is tolerating dressing changes much better we will get twice daily dressings with the nurses changing the p.m. dressing with Dakin solution.  Hopefully can transition to all bedside changes and she can be discharged to SNF in the not so distant future.  Cipro started to cover infection.   Brandon C. Donzetta Matters, MD [facility] Office: [phone] Pager: [phone]

## 2019-10-26 NOTE — Progress Notes (Addendum)
ANTICOAGULATION CONSULT NOTE - Follow Up Consult  Pharmacy Consult forheparin Indication:h/o DVT  Allergies  Allergen Reactions  . Ambien [Zolpidem Tartrate] Other (See Comments)    Per the pt, "it makes me go crazy"  . Sulfa Antibiotics Rash  . Ativan [Lorazepam] Other (See Comments)    Patient has the opposite effect when taking medication   . Elavil [Amitriptyline Hcl] Other (See Comments)    Gastritis  . Prozac [Fluoxetine Hcl] Anxiety and Other (See Comments)    irritability     Patient Measurements: Height: 5' 7.48" (171.4 cm) Weight: 205 lb 4 oz (93.1 kg) IBW/kg (Calculated) : 62.7 Heparin Dosing Weight: 83 kg  Vital Signs: Temp: 97.8 F (36.6 C) (10/28 0509) Temp Source: Oral (10/28 0509) BP: 108/51 (10/28 0509) Pulse Rate: 95 (10/28 0800)  Labs: Recent Labs    10/24/19 0306 10/25/19 0344 10/26/19 0306  HGB  --   --  7.4*  HCT  --   --  24.4*  PLT  --   --  583*  HEPARINUNFRC 0.22* 0.25* 0.51  CREATININE 1.33*  --   --     Estimated Creatinine Clearance: 47.2 mL/min (A) (by C-G formula based on SCr of 1.33 mg/dL (H)).   Assessment: 39 yoF on Eliquis PTA for hx DVT, last dose was 10/14pm and pt has been transitioned to heparin given multiple procedures needed and wound vac in place.  Heparin level therapeutic this morning at 0.51, slight H/H down trend, plt stable, no S/Sx bleeding documented, tolerated dressing changes this AM.    Goal of Therapy:  Heparin level 0.3-0.7 units/ml Monitor platelets by anticoagulation protocol: Yes   Plan:  -Continue heparin to 1150 units/hr -Confirmatory HL at 0900 -Daily HL and CBC in am   ADDENDUM: Confirmatory HL drawn at 08:51 = 0.48, therapeutic. Continue monitoring HL daily   Benetta Spar, PharmD, BCPS, [facility] Clinical Pharmacist  Please check AMION for all [facility] [place] phone numbers After 10:00 PM, call [facility] [phone]

## 2019-10-26 NOTE — Progress Notes (Signed)
Nutrition Follow up  DOCUMENTATION CODES:   Obesity unspecified  INTERVENTION:   -Monitor for BM- days 15 without  -Recommend addition of miralax as pt takes this at home -D/C Glucerna Shakes   Add Ensure MAX Protein po BID, each supplement provides 150 kcal and 30 grams of protein  Continue MVI daily   NUTRITION DIAGNOSIS:   Increased nutrient needs related to post-op healing as evidenced by estimated needs.  Ongoing  GOAL:   Patient will meet greater than or equal to 90% of their needs  MONITOR:   PO intake, Supplement acceptance, Weight trends, Labs, I & O's  REASON FOR ASSESSMENT:   Other (Comment)(LOS)    ASSESSMENT:   Patient with PMH significant for HTN, HLD, DM, severe COPD, cor pulmonale, and CAD. Presents this admission with R lower extremity critical limb ischemia.   10/1- R iliofemoral endart w/ vein angioplasty 10/13-R groin debridement, R great toe amputation 10/15- R groin debridement, wound VAC 10/17- R groin debridement, change wound VAC 10/20 R groin debridement, change wound VAC 10/23- R groin debridement, change wound VAC  10/26- R groin debridement, change wound VAC   Pt confused upon follow up. Spoke with RN who reports pt is eating minimal amounts at [facility] times. Meal completions charted as 10-60% for pt's last eight meals. Drinks excessive amounts of diet coke. Drinking Atkins shakes brought from home inconsistently. RD to change Glucerna to Ensure MAX as this is similar to Atkins. If appetite does not progress, may consider Cortrak placement and initiation of EN to promote wound healing.   Of note: Pt without BM in 15 days. RN suspects this is related to pt not receiving home dose of miralax. Will attempt to discuss with VS.   Admission weight: 90.7 kg  Current weight: 93.1 kg   I/O: -19,932 ml since 10/14 UOP: 1,775 ml x 24 hrs   Medications: colace, senokot, ferrous sulfate, folic acid, 40 mg lasix BID, SS novolog, lantus, Mag-ox, MVI  with iron, 10 mEq KCl BID Labs: Na 134 (L) CBG 117-224   Diet Order:   Diet Order            Diet Carb Modified Fluid consistency: Thin; Room service appropriate? Yes with Assist  Diet effective now              EDUCATION NEEDS:   Education needs have been addressed  Skin:  Skin Assessment: Skin Integrity Issues: Skin Integrity Issues:: Wound VAC, Incisions, Stage II Stage II: buttocks Wound Vac: R groin Incisions: R foot/ R groin  Last BM:  10/13  Height:   Ht Readings from Last 1 Encounters:  10/18/19 5' 7.48" (1.714 m)    Weight:   Wt Readings from Last 1 Encounters:  10/18/19 93.1 kg    Ideal Body Weight:  63.6 kg  BMI:  Body mass index is 31.69 kg/m.  Estimated Nutritional Needs:   Kcal:  1700-1900 kcal  Protein:  85-90 grams  Fluid:  >/= 1.7 L/day   Mariana Single RD, LDN Clinical Nutrition Pager # - [phone]

## 2019-10-27 LAB — CBC
HCT: 23.3 % — ABNORMAL LOW (ref 36.0–46.0)
Hemoglobin: 7 g/dL — ABNORMAL LOW (ref 12.0–15.0)
MCH: 27.1 pg (ref 26.0–34.0)
MCHC: 30 g/dL (ref 30.0–36.0)
MCV: 90.3 fL (ref 80.0–100.0)
Platelets: 549 10*3/uL — ABNORMAL HIGH (ref 150–400)
RBC: 2.58 MIL/uL — ABNORMAL LOW (ref 3.87–5.11)
RDW: 16.7 % — ABNORMAL HIGH (ref 11.5–15.5)
WBC: 18.4 10*3/uL — ABNORMAL HIGH (ref 4.0–10.5)
nRBC: 0 % (ref 0.0–0.2)

## 2019-10-27 LAB — GLUCOSE, CAPILLARY
Glucose-Capillary: 119 mg/dL — ABNORMAL HIGH (ref 70–99)
Glucose-Capillary: 117 mg/dL — ABNORMAL HIGH (ref 70–99)
Glucose-Capillary: 129 mg/dL — ABNORMAL HIGH (ref 70–99)
Glucose-Capillary: 140 mg/dL — ABNORMAL HIGH (ref 70–99)

## 2019-10-27 LAB — HEPARIN LEVEL (UNFRACTIONATED): Heparin Unfractionated: 0.3 IU/mL (ref 0.30–0.70)

## 2019-10-27 MED ORDER — POLYETHYLENE GLYCOL 3350 17 G PO PACK
17.0000 g | PACK | Freq: Every day | ORAL | Status: DC
  Administered 2019-10-28 – 2019-11-17 (×15): 17 g via ORAL
  Filled 2019-10-27 (×17): qty 1

## 2019-10-27 NOTE — Progress Notes (Signed)
ANTICOAGULATION CONSULT NOTE - Follow Up Consult  Pharmacy Consult forHeparin Indication:h/o DVT  Allergies  Allergen Reactions  . Ambien [Zolpidem Tartrate] Other (See Comments)    Per the pt, "it makes me go crazy"  . Sulfa Antibiotics Rash  . Ativan [Lorazepam] Other (See Comments)    Patient has the opposite effect when taking medication   . Elavil [Amitriptyline Hcl] Other (See Comments)    Gastritis  . Prozac [Fluoxetine Hcl] Anxiety and Other (See Comments)    irritability     Patient Measurements: Height: 5' 7.48" (171.4 cm) Weight: 205 lb 4 oz (93.1 kg) IBW/kg (Calculated) : 62.7 Heparin Dosing Weight: 83 kg  Vital Signs: Temp: 98.9 F (37.2 C) (10/29 0509) Temp Source: Oral (10/29 0509) BP: 105/73 (10/29 0509) Pulse Rate: 97 (10/29 0509)  Labs: Recent Labs    10/26/19 0306 10/26/19 0851 10/27/19 0341  HGB 7.4*  --  7.0*  HCT 24.4*  --  23.3*  PLT 583*  --  549*  HEPARINUNFRC 0.51 0.48 0.30    Estimated Creatinine Clearance: 47.2 mL/min (A) (by C-G formula based on SCr of 1.33 mg/dL (H)).   Assessment: 21 yoF on Eliquis PTA for hx DVT, last dose was 10/14pm and pt has been transitioned to heparin given multiple procedures needed and wound vac in place.  Heparin level low therapeutic (0.30) on 1150 units/hr. Slight H/H down trend, plt stable, no S/Sx bleeding documented, noted tolerating dressing change this AM.   Goal of Therapy:  Heparin level 0.3-0.7 units/ml Monitor platelets by anticoagulation protocol: Yes   Plan:   Continue heparin drip at 1150 units/hr  Daily heparin level and CBC  Follow up for resuming Eliquis when able.  Arty Baumgartner, [place] Pager: [phone] or phone: [phone] 10/27/2019 10:27 AM

## 2019-10-27 NOTE — Care Management Important Message (Signed)
Important Message  Patient Details  Name: WALESKA RAILING MRN: [id number] Date of Birth: 1951/03/10   Medicare Important Message Given:  Yes     [name] 10/27/2019, 1:39 PM

## 2019-10-27 NOTE — Progress Notes (Addendum)
[facility]  Subjective  - No new complaints, continued pain with dressing changes.   Objective 105/73 97 98.9 F (37.2 C) (Oral) 17 91%  Intake/Output Summary (Last 24 hours) at 10/27/2019 0734 Last data filed at 10/26/2019 2057 Gross per 24 hour  Intake 220 ml  Output 1125 ml  Net -905 ml   Right groin wet to dry dressing removed wound bed with granulation tissue, no greenish discharge on old dressing this am.  Dressing replaced. Right PT doppler signal, GT toe amp. Healing Lungs non labored breathing   Assessment/Planning:  S/P right femoral endarterectomy followed by GT amputation andright groin sartorius muscle rotational flap. Wound vac changes with groin debridement x 4.  Muscle flap appears viable with good skin edges22 cm x 10 cm x 5 cm Started Dakins wet to 10/26/19 will cont. BID dressing changes Pending Dr. Donzetta Matters discussion with her Husband D/C planning SNF  Roxy Horseman 10/27/2019 7:34 AM --  Laboratory Lab Results: Recent Labs    10/26/19 0306 10/27/19 0341  WBC 15.0* 18.4*  HGB 7.4* 7.0*  HCT 24.4* 23.3*  PLT 583* 549*   BMET No results for input(s): NA, K, CL, CO2, GLUCOSE, BUN, CREATININE, CALCIUM in the last 72 hours.  COAG Lab Results  Component Value Date   INR 1.2 10/18/2019   INR 1.2 09/29/2019   INR 1.1 09/15/2019   No results found for: PTT   I have interviewed and examined patient with PA and agree with assessment and plan above.  Wound without any green drainage today.  She is tolerating dressing changes much better with Dakin's.  We will continue twice daily dressing changes.  Nutrition is of utmost importance.  Terrionna Bridwell C. Donzetta Matters, MD [facility] Office: [phone] Pager: [phone]

## 2019-10-28 ENCOUNTER — Encounter: Payer: Medicare Other | Admitting: Family

## 2019-10-28 LAB — CBC
HCT: 23.2 % — ABNORMAL LOW (ref 36.0–46.0)
Hemoglobin: 7 g/dL — ABNORMAL LOW (ref 12.0–15.0)
MCH: 27 pg (ref 26.0–34.0)
MCHC: 30.2 g/dL (ref 30.0–36.0)
MCV: 89.6 fL (ref 80.0–100.0)
Platelets: 528 10*3/uL — ABNORMAL HIGH (ref 150–400)
RBC: 2.59 MIL/uL — ABNORMAL LOW (ref 3.87–5.11)
RDW: 16.9 % — ABNORMAL HIGH (ref 11.5–15.5)
WBC: 13.7 10*3/uL — ABNORMAL HIGH (ref 4.0–10.5)
nRBC: 0 % (ref 0.0–0.2)

## 2019-10-28 LAB — GLUCOSE, CAPILLARY
Glucose-Capillary: 138 mg/dL — ABNORMAL HIGH (ref 70–99)
Glucose-Capillary: 145 mg/dL — ABNORMAL HIGH (ref 70–99)
Glucose-Capillary: 148 mg/dL — ABNORMAL HIGH (ref 70–99)
Glucose-Capillary: 133 mg/dL — ABNORMAL HIGH (ref 70–99)

## 2019-10-28 LAB — HEPARIN LEVEL (UNFRACTIONATED): Heparin Unfractionated: 0.35 IU/mL (ref 0.30–0.70)

## 2019-10-28 NOTE — Progress Notes (Signed)
ANTICOAGULATION CONSULT NOTE - Follow Up Consult  Pharmacy Consult forheparin Indication:h/o DVT  Allergies  Allergen Reactions  . Ambien [Zolpidem Tartrate] Other (See Comments)    Per the pt, "it makes me go crazy"  . Sulfa Antibiotics Rash  . Ativan [Lorazepam] Other (See Comments)    Patient has the opposite effect when taking medication   . Elavil [Amitriptyline Hcl] Other (See Comments)    Gastritis  . Prozac [Fluoxetine Hcl] Anxiety and Other (See Comments)    irritability     Patient Measurements: Height: 5' 7.48" (171.4 cm) Weight: 205 lb 4 oz (93.1 kg) IBW/kg (Calculated) : 62.7 Heparin Dosing Weight: 83 kg  Vital Signs: Temp: 98 F (36.7 C) (10/30 0410) Temp Source: Oral (10/30 0410) BP: 131/58 (10/30 0410) Pulse Rate: 85 (10/30 0410)  Labs: Recent Labs    10/26/19 0306 10/26/19 0851 10/27/19 0341 10/28/19 0241  HGB 7.4*  --  7.0* 7.0*  HCT 24.4*  --  23.3* 23.2*  PLT 583*  --  549* 528*  HEPARINUNFRC 0.51 0.48 0.30 0.35    Estimated Creatinine Clearance: 47.2 mL/min (A) (by C-G formula based on SCr of 1.33 mg/dL (H)).   Assessment: 57 yoF on Eliquis PTA for hx DVT, last dose was 10/14pm and pt has been transitioned to heparin given multiple procedures needed and wound vac in place.  Heparin level remains therapeutic, CBC low but stable, planning for Eliquis at discharge.  Goal of Therapy:  Heparin level 0.3-0.7 units/ml Monitor platelets by anticoagulation protocol: Yes   Plan:  -Continue heparin to 1150 units/hr -Daily heparin level and CBC   Arrie Senate, PharmD, BCPS Clinical Pharmacist [phone] Please check AMION for all [facility] numbers 10/28/2019

## 2019-10-28 NOTE — Progress Notes (Signed)
  Progress Note    10/28/2019 8:29 AM 4 Days Post-Op  Subjective:  No overnight issues  Vitals:   10/27/19 2135 10/28/19 0410  BP:  (!) 131/58  Pulse:  85  Resp:  15  Temp:  98 F (36.7 C)  SpO2: 94% 100%    Physical Exam: Awake and alert Non labored respirations Right groin wound is stable, no bleeding there is desiccation of the edge on the lateral aspect hand inferior medial as well.  No active infection Right great toe amputation site healing well foot appears well perfused  CBC    Component Value Date/Time   WBC 13.7 (H) 10/28/2019 0241   RBC 2.59 (L) 10/28/2019 0241   HGB 7.0 (L) 10/28/2019 0241   HGB 11.2 01/28/2018 1035   HGB 12.0 09/29/2011 1016   HCT 23.2 (L) 10/28/2019 0241   HCT 34.4 01/28/2018 1035   HCT 35.3 09/29/2011 1016   PLT 528 (H) 10/28/2019 0241   PLT 384 (H) 01/28/2018 1035   MCV 89.6 10/28/2019 0241   MCV 88 01/28/2018 1035   MCV 88.0 09/29/2011 1016   MCH 27.0 10/28/2019 0241   MCHC 30.2 10/28/2019 0241   RDW 16.9 (H) 10/28/2019 0241   RDW 17.6 (H) 01/28/2018 1035   RDW 14.8 (H) 09/29/2011 1016   LYMPHSABS 1.6 10/14/2019 0529   LYMPHSABS 0.6 (L) 09/29/2011 1016   MONOABS 1.7 (H) 10/14/2019 0529   MONOABS 0.8 09/29/2011 1016   EOSABS 0.1 10/14/2019 0529   EOSABS 0.3 09/29/2011 1016   BASOSABS 0.1 10/14/2019 0529   BASOSABS 0.1 09/29/2011 1016    BMET    Component Value Date/Time   NA 134 (L) 10/24/2019 0306   NA 142 01/28/2018 1035   K 3.9 10/24/2019 0306   CL 94 (L) 10/24/2019 0306   CO2 25 10/24/2019 0306   GLUCOSE 140 (H) 10/24/2019 0306   BUN 16 10/24/2019 0306   BUN 19 01/28/2018 1035   BUN 14.7 07/18/2014 1142   CREATININE 1.33 (H) 10/24/2019 0306   CREATININE 1.0 07/18/2014 1142   CALCIUM 8.7 (L) 10/24/2019 0306   GFRNONAA 41 (L) 10/24/2019 0306   GFRAA 47 (L) 10/24/2019 0306    INR    Component Value Date/Time   INR 1.2 10/18/2019 0332     Intake/Output Summary (Last 24 hours) at 10/28/2019 0829 Last  data filed at 10/28/2019 0219 Gross per 24 hour  Intake 480 ml  Output 2145 ml  Net -1665 ml     Assessment/plan:  68 y.o. female is right common femoral endarterectomy with vein patch angioplasty.  She is undergone multiple groin debridements.  Currently doing twice daily wet-to-dry dressings with Dakin's.  She is on a heparin drip as she is on Eliquis at home for DVT we will transition her to Eliquis prior to discharge.  Dispo is pending getting this wound to the beginning stages of healing.  Nutrition is her most important medicine.  We have started ciprofloxacin for concern of Pseudomonas in her groin we will continue this for at least 5 days.    Kerin Cecchi C. Donzetta Matters, MD [facility] Office: [phone] Pager: [phone]  10/28/2019 8:29 AM

## 2019-10-28 NOTE — Progress Notes (Signed)
PT Cancellation Note  Patient Details Name: Lauren [name] MRN: [id number] DOB: 26-Feb-1951   Cancelled Treatment:    Reason Eval/Treat Not Completed: Patient declined, no reason specified Patient declined participating in therapy at this time due to c/o back pain. Pt provided max encouragement to participate and educated on benefits of mobility to overall recovery.  Continue to recommend nursing staff transfer pt  OOB daily with use of maxi move hoyer lift.   Earney Navy, PTA Acute Rehabilitation Services Pager: [phone] Office: [phone]   10/28/2019, 3:03 PM

## 2019-10-28 NOTE — Progress Notes (Signed)
Pt c/o swelling in right jaw. Palpated in front of right ear and felt her salivary gland which was larger than the one on the left side. No redness or warmth noted. Will pass this information on to the Day Shift RN. Lajoyce Corners, RN

## 2019-10-29 LAB — CBC
HCT: 24.5 % — ABNORMAL LOW (ref 36.0–46.0)
Hemoglobin: 7.4 g/dL — ABNORMAL LOW (ref 12.0–15.0)
MCH: 26.9 pg (ref 26.0–34.0)
MCHC: 30.2 g/dL (ref 30.0–36.0)
MCV: 89.1 fL (ref 80.0–100.0)
Platelets: 578 10*3/uL — ABNORMAL HIGH (ref 150–400)
RBC: 2.75 MIL/uL — ABNORMAL LOW (ref 3.87–5.11)
RDW: 16.7 % — ABNORMAL HIGH (ref 11.5–15.5)
WBC: 14.6 10*3/uL — ABNORMAL HIGH (ref 4.0–10.5)
nRBC: 0 % (ref 0.0–0.2)

## 2019-10-29 LAB — GLUCOSE, CAPILLARY
Glucose-Capillary: 138 mg/dL — ABNORMAL HIGH (ref 70–99)
Glucose-Capillary: 105 mg/dL — ABNORMAL HIGH (ref 70–99)
Glucose-Capillary: 135 mg/dL — ABNORMAL HIGH (ref 70–99)
Glucose-Capillary: 160 mg/dL — ABNORMAL HIGH (ref 70–99)

## 2019-10-29 LAB — HEPARIN LEVEL (UNFRACTIONATED): Heparin Unfractionated: 0.31 IU/mL (ref 0.30–0.70)

## 2019-10-29 MED ORDER — TRAMADOL HCL 50 MG PO TABS
50.0000 mg | ORAL_TABLET | Freq: Four times a day (QID) | ORAL | Status: DC
  Administered 2019-10-29 – 2019-11-18 (×76): 50 mg via ORAL
  Filled 2019-10-29 (×78): qty 1

## 2019-10-29 MED ORDER — SODIUM CHLORIDE 0.9 % IV SOLN
INTRAVENOUS | Status: DC
  Administered 2019-10-29 – 2019-11-02 (×11): via INTRAVENOUS

## 2019-10-29 NOTE — Progress Notes (Signed)
ANTICOAGULATION CONSULT NOTE - Follow Up Consult  Pharmacy Consult forheparin Indication:h/o DVT  Allergies  Allergen Reactions  . Ambien [Zolpidem Tartrate] Other (See Comments)    Per the pt, "it makes me go crazy"  . Sulfa Antibiotics Rash  . Ativan [Lorazepam] Other (See Comments)    Patient has the opposite effect when taking medication   . Elavil [Amitriptyline Hcl] Other (See Comments)    Gastritis  . Prozac [Fluoxetine Hcl] Anxiety and Other (See Comments)    irritability     Patient Measurements: Height: 5' 7.48" (171.4 cm) Weight: 205 lb 4 oz (93.1 kg) IBW/kg (Calculated) : 62.7 Heparin Dosing Weight: 83 kg  Vital Signs: Temp: 97.7 F (36.5 C) (10/31 1200) Temp Source: Oral (10/31 1200) BP: 97/50 (10/31 1200) Pulse Rate: 97 (10/31 0435)  Labs: Recent Labs    10/27/19 0341 10/28/19 0241 10/29/19 0303  HGB 7.0* 7.0* 7.4*  HCT 23.3* 23.2* 24.5*  PLT 549* 528* 578*  HEPARINUNFRC 0.30 0.35 0.31    Estimated Creatinine Clearance: 47.2 mL/min (A) (by C-G formula based on SCr of 1.33 mg/dL (H)).   Assessment: 27 yoF on Eliquis PTA for hx DVT, last dose was 10/14pm and pt has been transitioned to heparin given multiple procedures needed and wound vac in place.  Heparin level therapeutic this morning. H&H stable, no signs of bleeding.  Goal of Therapy:  Heparin level 0.3-0.7 units/ml Monitor platelets by anticoagulation protocol: Yes   Plan:  -Continue heparin to 1150 units/hr -Daily heparin level and CBC   Thank you for allowing pharmacy to participate in this patient's care.  Akai Dollard L. Devin Going, [place] PGY1 Pharmacy Resident 10/29/19      1:58 PM  Please check AMION for all [place] phone numbers After 10:00 PM, call the [facility] [phone]

## 2019-10-29 NOTE — Progress Notes (Signed)
Attempted right groin dressing change at this time. Patient refused dressing change stating she was in pain. Will attempt dressing change at a later time.

## 2019-10-29 NOTE — Plan of Care (Signed)
  Problem: Clinical Measurements: Goal: Ability to maintain clinical measurements within normal limits will improve Outcome: Adequate for Discharge Goal: Will remain free from infection Outcome: Adequate for Discharge Goal: Diagnostic test results will improve Outcome: Adequate for Discharge Goal: Respiratory complications will improve Outcome: Adequate for Discharge Goal: Cardiovascular complication will be avoided Outcome: Adequate for Discharge   Problem: Activity: Goal: Risk for activity intolerance will decrease Outcome: Adequate for Discharge   Problem: Coping: Goal: Level of anxiety will decrease Outcome: Adequate for Discharge   Problem: Elimination: Goal: Will not experience complications related to urinary retention Outcome: Adequate for Discharge   Problem: Pain Managment: Goal: General experience of comfort will improve Outcome: Adequate for Discharge   Problem: Skin Integrity: Goal: Risk for impaired skin integrity will decrease Outcome: Adequate for Discharge

## 2019-10-29 NOTE — Progress Notes (Addendum)
  Progress Note    10/29/2019 7:42 AM 5 Days Post-Op  Subjective: No new complaints this morning   Vitals:   10/28/19 2244 10/29/19 0435  BP:  117/76  Pulse: 87 97  Resp: 16 16  Temp:  98.6 F (37 C)  SpO2: 95% 93%   Physical Exam: Lungs:  Non labored Incisions:  R groin wound bed with pink/red muscle, medial and lateral edges with some devitalized tissue; some skin sloughing also RLQ abdomen Extremities:  R GT amp site dry, no drainage, no sign of infection Neurologic: A&O  CBC    Component Value Date/Time   WBC 14.6 (H) 10/29/2019 0303   RBC 2.75 (L) 10/29/2019 0303   HGB 7.4 (L) 10/29/2019 0303   HGB 11.2 01/28/2018 1035   HGB 12.0 09/29/2011 1016   HCT 24.5 (L) 10/29/2019 0303   HCT 34.4 01/28/2018 1035   HCT 35.3 09/29/2011 1016   PLT 578 (H) 10/29/2019 0303   PLT 384 (H) 01/28/2018 1035   MCV 89.1 10/29/2019 0303   MCV 88 01/28/2018 1035   MCV 88.0 09/29/2011 1016   MCH 26.9 10/29/2019 0303   MCHC 30.2 10/29/2019 0303   RDW 16.7 (H) 10/29/2019 0303   RDW 17.6 (H) 01/28/2018 1035   RDW 14.8 (H) 09/29/2011 1016   LYMPHSABS 1.6 10/14/2019 0529   LYMPHSABS 0.6 (L) 09/29/2011 1016   MONOABS 1.7 (H) 10/14/2019 0529   MONOABS 0.8 09/29/2011 1016   EOSABS 0.1 10/14/2019 0529   EOSABS 0.3 09/29/2011 1016   BASOSABS 0.1 10/14/2019 0529   BASOSABS 0.1 09/29/2011 1016    BMET    Component Value Date/Time   NA 134 (L) 10/24/2019 0306   NA 142 01/28/2018 1035   K 3.9 10/24/2019 0306   CL 94 (L) 10/24/2019 0306   CO2 25 10/24/2019 0306   GLUCOSE 140 (H) 10/24/2019 0306   BUN 16 10/24/2019 0306   BUN 19 01/28/2018 1035   BUN 14.7 07/18/2014 1142   CREATININE 1.33 (H) 10/24/2019 0306   CREATININE 1.0 07/18/2014 1142   CALCIUM 8.7 (L) 10/24/2019 0306   GFRNONAA 41 (L) 10/24/2019 0306   GFRAA 47 (L) 10/24/2019 0306    INR    Component Value Date/Time   INR 1.2 10/18/2019 0332     Intake/Output Summary (Last 24 hours) at 10/29/2019 0742 Last data  filed at 10/29/2019 0500 Gross per 24 hour  Intake 480 ml  Output 1300 ml  Net -820 ml     Assessment/Plan:  68 y.o. female is s/p R CFA endarterectomy with vein patch angioplasty with subsequent groin debridements including muscle flap 5 Days Post-Op   RLE warm well perfused; GT amp site dry dressing changes as needed R groin dressing changed; continue wet to dry with dakins BID for concern of pseudomonas Continue IV heparin; transition to Eliquis at discharge Encouraged nutrition; Reviewed role of nutrition for wound healing   Dagoberto Ligas, PA-C [facility] [phone] 10/29/2019 7:42 AM  I have interviewed the patient and examined the patient. I agree with the findings by the PA.  She continues dressing changes with Dakin's solution twice daily.  The right great toe amputation site is stable.  Gae Gallop, MD [phone]

## 2019-10-29 NOTE — Plan of Care (Signed)
  Problem: Coping: Goal: Level of anxiety will decrease Outcome: Not Progressing   Problem: Pain Managment: Goal: General experience of comfort will improve Outcome: Not Progressing   

## 2019-10-30 LAB — GLUCOSE, CAPILLARY
Glucose-Capillary: 133 mg/dL — ABNORMAL HIGH (ref 70–99)
Glucose-Capillary: 199 mg/dL — ABNORMAL HIGH (ref 70–99)
Glucose-Capillary: 165 mg/dL — ABNORMAL HIGH (ref 70–99)
Glucose-Capillary: 119 mg/dL — ABNORMAL HIGH (ref 70–99)

## 2019-10-30 LAB — CBC
HCT: 23.6 % — ABNORMAL LOW (ref 36.0–46.0)
Hemoglobin: 7.1 g/dL — ABNORMAL LOW (ref 12.0–15.0)
MCH: 26.8 pg (ref 26.0–34.0)
MCHC: 30.1 g/dL (ref 30.0–36.0)
MCV: 89.1 fL (ref 80.0–100.0)
Platelets: 573 10*3/uL — ABNORMAL HIGH (ref 150–400)
RBC: 2.65 MIL/uL — ABNORMAL LOW (ref 3.87–5.11)
RDW: 17.1 % — ABNORMAL HIGH (ref 11.5–15.5)
WBC: 15 10*3/uL — ABNORMAL HIGH (ref 4.0–10.5)
nRBC: 0.1 % (ref 0.0–0.2)

## 2019-10-30 LAB — BASIC METABOLIC PANEL
Anion gap: 11 (ref 5–15)
BUN: 19 mg/dL (ref 8–23)
CO2: 22 mmol/L (ref 22–32)
Calcium: 8.5 mg/dL — ABNORMAL LOW (ref 8.9–10.3)
Chloride: 101 mmol/L (ref 98–111)
Creatinine, Ser: 1.24 mg/dL — ABNORMAL HIGH (ref 0.44–1.00)
GFR calc Af Amer: 51 mL/min — ABNORMAL LOW (ref >60)
GFR calc non Af Amer: 44 mL/min — ABNORMAL LOW (ref >60)
Glucose, Bld: 131 mg/dL — ABNORMAL HIGH (ref 70–99)
Potassium: 4.2 mmol/L (ref 3.5–5.1)
Sodium: 134 mmol/L — ABNORMAL LOW (ref 135–145)

## 2019-10-30 LAB — HEPARIN LEVEL (UNFRACTIONATED)
Heparin Unfractionated: 0.59 IU/mL (ref 0.30–0.70)
Heparin Unfractionated: 0.34 IU/mL (ref 0.30–0.70)

## 2019-10-30 MED ORDER — SODIUM CHLORIDE 0.9% FLUSH
10.0000 mL | INTRAVENOUS | Status: DC | PRN
  Administered 2019-11-10: 10 mL
  Filled 2019-10-30: qty 40

## 2019-10-30 MED ORDER — HYDROMORPHONE HCL 1 MG/ML IJ SOLN
0.5000 mg | INTRAMUSCULAR | Status: DC | PRN
  Administered 2019-10-31 – 2019-11-17 (×26): 0.5 mg via INTRAVENOUS
  Filled 2019-10-30 (×28): qty 1

## 2019-10-30 MED ORDER — OXYCODONE HCL ER 15 MG PO T12A
15.0000 mg | EXTENDED_RELEASE_TABLET | Freq: Two times a day (BID) | ORAL | Status: DC
  Administered 2019-10-30 – 2019-11-18 (×39): 15 mg via ORAL
  Filled 2019-10-30 (×39): qty 1

## 2019-10-30 MED ORDER — SODIUM CHLORIDE 0.9% FLUSH
10.0000 mL | Freq: Two times a day (BID) | INTRAVENOUS | Status: DC
  Administered 2019-10-31: 11:00:00 20 mL
  Administered 2019-11-03 – 2019-11-07 (×8): 10 mL
  Administered 2019-11-08: 09:00:00 20 mL
  Administered 2019-11-10 – 2019-11-15 (×6): 10 mL
  Administered 2019-11-17: 20 mL
  Administered 2019-11-17 – 2019-11-18 (×2): 10 mL

## 2019-10-30 NOTE — Progress Notes (Signed)
ANTICOAGULATION CONSULT NOTE  Pharmacy Consult:  Heparin Indication:  History of DVT  Allergies  Allergen Reactions  . Ambien [Zolpidem Tartrate] Other (See Comments)    Per the pt, "it makes me go crazy"  . Sulfa Antibiotics Rash  . Ativan [Lorazepam] Other (See Comments)    Patient has the opposite effect when taking medication   . Elavil [Amitriptyline Hcl] Other (See Comments)    Gastritis  . Prozac [Fluoxetine Hcl] Anxiety and Other (See Comments)    irritability     Patient Measurements: Height: 5' 7.48" (171.4 cm) Weight: 205 lb 4 oz (93.1 kg) IBW/kg (Calculated) : 62.7 Heparin Dosing Weight: 83 kg  Vital Signs: Temp: 98.2 F (36.8 C) (11/01 1124) Temp Source: Oral (11/01 1124) BP: 118/55 (11/01 1410) Pulse Rate: 91 (11/01 1124)  Labs: Recent Labs    10/28/19 0241 10/29/19 0303 10/30/19 0328 10/30/19 1455  HGB 7.0* 7.4* 7.1*  --   HCT 23.2* 24.5* 23.6*  --   PLT 528* 578* 573*  --   HEPARINUNFRC 0.35 0.31 0.59 0.34  CREATININE  --   --  1.24*  --     Estimated Creatinine Clearance: 50.6 mL/min (A) (by C-G formula based on SCr of 1.24 mg/dL (H)).   Assessment: 11 yoF on Eliquis PTA for hx DVT, last dose was 10/14pm and pt has been transitioned to heparin given multiple procedures needed and wound vac in place.  Heparin level is therapeutic; no bleeding reported.  Goal of Therapy:  Heparin level 0.3-0.7 units/ml Monitor platelets by anticoagulation protocol: Yes   Plan:  Continue heparin gtt at 1150 units/hr F/U AM labs  Chanler Schreiter D. Mina Marble, PharmD, BCPS, [place] 10/30/2019, 3:41 PM

## 2019-10-30 NOTE — Progress Notes (Signed)
ANTICOAGULATION CONSULT NOTE - Follow Up Consult  Pharmacy Consult forheparin Indication:h/o DVT  Allergies  Allergen Reactions  . Ambien [Zolpidem Tartrate] Other (See Comments)    Per the pt, "it makes me go crazy"  . Sulfa Antibiotics Rash  . Ativan [Lorazepam] Other (See Comments)    Patient has the opposite effect when taking medication   . Elavil [Amitriptyline Hcl] Other (See Comments)    Gastritis  . Prozac [Fluoxetine Hcl] Anxiety and Other (See Comments)    irritability     Patient Measurements: Height: 5' 7.48" (171.4 cm) Weight: 205 lb 4 oz (93.1 kg) IBW/kg (Calculated) : 62.7 Heparin Dosing Weight: 83 kg  Vital Signs: Temp: 98.4 F (36.9 C) (11/01 0615) Temp Source: Oral (11/01 0615) BP: 139/56 (11/01 0615) Pulse Rate: 89 (11/01 0615)  Labs: Recent Labs    10/28/19 0241 10/29/19 0303 10/30/19 0328  HGB 7.0* 7.4* 7.1*  HCT 23.2* 24.5* 23.6*  PLT 528* 578* 573*  HEPARINUNFRC 0.35 0.31 0.59  CREATININE  --   --  1.24*    Estimated Creatinine Clearance: 50.6 mL/min (A) (by C-G formula based on SCr of 1.24 mg/dL (H)).   Assessment: 39 yoF on Eliquis PTA for hx DVT, last dose was 10/14pm and pt has been transitioned to heparin given multiple procedures needed and wound vac in place.  Heparin level therapeutic (0.59) on heparin rate of 1,150 units/hr. H&H is low but stable, no signs of bleeding. Low Hg expected due to recent vascular procedure. Pt's heparin was paused this morning due to loss of an IV access site. Issue is now resolved and heparin will be restarted.  Goal of Therapy:  Heparin level 0.3-0.7 units/ml Monitor platelets by anticoagulation protocol: Yes   Plan:  - Will not bolus due to recent R CFA endarterectomy with vein patch angioplasty - Restart heparin infusion at 1150 units/hr - Check heparin level in 6 hours - Daily heparin level and CBC - Monitor for signs/symptoms of bleeding   Thank you for allowing pharmacy to  participate in this patient's care.  Sherren Kerns, PharmD PGY1 Acute Care Pharmacy Resident  Please check AMION for all [place] phone numbers After 10:00 PM, call the [place] [phone]

## 2019-10-30 NOTE — Progress Notes (Addendum)
  Progress Note    10/30/2019 8:05 AM 6 Days Post-Op  Subjective:  No new complaints   Vitals:   10/29/19 2046 10/30/19 0615  BP:  (!) 139/56  Pulse:  89  Resp:  16  Temp:  98.4 F (36.9 C)  SpO2: 96% 98%   Physical Exam: Lungs:  Non labored Incisions:  R groin incision with healthy muscle at wound bed however necrotic tissue at skin edges with sloughing; skin breakdown RLQ abdomen Extremities:  R PTA multiphasic by doppler; R GT amp site stable, no obvious sign of infection Neurologic: somnolent      CBC    Component Value Date/Time   WBC 15.0 (H) 10/30/2019 0328   RBC 2.65 (L) 10/30/2019 0328   HGB 7.1 (L) 10/30/2019 0328   HGB 11.2 01/28/2018 1035   HGB 12.0 09/29/2011 1016   HCT 23.6 (L) 10/30/2019 0328   HCT 34.4 01/28/2018 1035   HCT 35.3 09/29/2011 1016   PLT 573 (H) 10/30/2019 0328   PLT 384 (H) 01/28/2018 1035   MCV 89.1 10/30/2019 0328   MCV 88 01/28/2018 1035   MCV 88.0 09/29/2011 1016   MCH 26.8 10/30/2019 0328   MCHC 30.1 10/30/2019 0328   RDW 17.1 (H) 10/30/2019 0328   RDW 17.6 (H) 01/28/2018 1035   RDW 14.8 (H) 09/29/2011 1016   LYMPHSABS 1.6 10/14/2019 0529   LYMPHSABS 0.6 (L) 09/29/2011 1016   MONOABS 1.7 (H) 10/14/2019 0529   MONOABS 0.8 09/29/2011 1016   EOSABS 0.1 10/14/2019 0529   EOSABS 0.3 09/29/2011 1016   BASOSABS 0.1 10/14/2019 0529   BASOSABS 0.1 09/29/2011 1016    BMET    Component Value Date/Time   NA 134 (L) 10/30/2019 0328   NA 142 01/28/2018 1035   K 4.2 10/30/2019 0328   CL 101 10/30/2019 0328   CO2 22 10/30/2019 0328   GLUCOSE 131 (H) 10/30/2019 0328   BUN 19 10/30/2019 0328   BUN 19 01/28/2018 1035   BUN 14.7 07/18/2014 1142   CREATININE 1.24 (H) 10/30/2019 0328   CREATININE 1.0 07/18/2014 1142   CALCIUM 8.5 (L) 10/30/2019 0328   GFRNONAA 44 (L) 10/30/2019 0328   GFRAA 51 (L) 10/30/2019 0328    INR    Component Value Date/Time   INR 1.2 10/18/2019 0332     Intake/Output Summary (Last 24 hours) at  10/30/2019 0805 Last data filed at 10/29/2019 2248 Gross per 24 hour  Intake 2244.78 ml  Output -  Net 2244.78 ml     Assessment/Plan:  68 y.o. female is s/p R CFA endarterectomy with vein patch angioplasty with subsequent groin debridements including muscle flap 6 Days Post-Op   R PTA signal brisk by doppler; R GT amp site stable R groin incision changed; Dakins wet to dry dressing changes BID; narcotics for dressing change only Dietician on board to help with wound healing  DVT prophylaxis:  IV heparin; transition to Eliquis at discharge   Dagoberto Ligas, PA-C [facility] [phone] 10/30/2019 8:05 AM  I have interviewed the patient and examined the patient. I agree with the findings by the PA.  The wound does show some good granulation tissue but still has some areas with fibrin exudate.  Continue meticulous wound care.  Pain management is somewhat of an issue.  She had some mental status changes yesterday so I held some of her pain medications.  Will restart slowly.  Gae Gallop, MD [phone]

## 2019-10-30 NOTE — Progress Notes (Addendum)
patient refused dressing change.

## 2019-10-31 LAB — GLUCOSE, CAPILLARY
Glucose-Capillary: 100 mg/dL — ABNORMAL HIGH (ref 70–99)
Glucose-Capillary: 160 mg/dL — ABNORMAL HIGH (ref 70–99)
Glucose-Capillary: 106 mg/dL — ABNORMAL HIGH (ref 70–99)
Glucose-Capillary: 116 mg/dL — ABNORMAL HIGH (ref 70–99)

## 2019-10-31 LAB — HEPARIN LEVEL (UNFRACTIONATED): Heparin Unfractionated: 0.43 IU/mL (ref 0.30–0.70)

## 2019-10-31 NOTE — Progress Notes (Addendum)
  Progress Note    10/31/2019 7:37 AM 7 Days Post-Op  Subjective:  Pain R groin   Vitals:   10/30/19 2036 10/31/19 0638  BP: 124/67 130/80  Pulse: 87 94  Resp: 15 18  Temp: 98.4 F (36.9 C) 98.5 F (36.9 C)  SpO2: 98% 98%   Physical Exam: Lungs:  Non labored Incisions:  R GT amp site stable, no sign of infection Extremities:  R PT by doppler Neurologic: A&O  CBC    Component Value Date/Time   WBC 15.0 (H) 10/30/2019 0328   RBC 2.65 (L) 10/30/2019 0328   HGB 7.1 (L) 10/30/2019 0328   HGB 11.2 01/28/2018 1035   HGB 12.0 09/29/2011 1016   HCT 23.6 (L) 10/30/2019 0328   HCT 34.4 01/28/2018 1035   HCT 35.3 09/29/2011 1016   PLT 573 (H) 10/30/2019 0328   PLT 384 (H) 01/28/2018 1035   MCV 89.1 10/30/2019 0328   MCV 88 01/28/2018 1035   MCV 88.0 09/29/2011 1016   MCH 26.8 10/30/2019 0328   MCHC 30.1 10/30/2019 0328   RDW 17.1 (H) 10/30/2019 0328   RDW 17.6 (H) 01/28/2018 1035   RDW 14.8 (H) 09/29/2011 1016   LYMPHSABS 1.6 10/14/2019 0529   LYMPHSABS 0.6 (L) 09/29/2011 1016   MONOABS 1.7 (H) 10/14/2019 0529   MONOABS 0.8 09/29/2011 1016   EOSABS 0.1 10/14/2019 0529   EOSABS 0.3 09/29/2011 1016   BASOSABS 0.1 10/14/2019 0529   BASOSABS 0.1 09/29/2011 1016    BMET    Component Value Date/Time   NA 134 (L) 10/30/2019 0328   NA 142 01/28/2018 1035   K 4.2 10/30/2019 0328   CL 101 10/30/2019 0328   CO2 22 10/30/2019 0328   GLUCOSE 131 (H) 10/30/2019 0328   BUN 19 10/30/2019 0328   BUN 19 01/28/2018 1035   BUN 14.7 07/18/2014 1142   CREATININE 1.24 (H) 10/30/2019 0328   CREATININE 1.0 07/18/2014 1142   CALCIUM 8.5 (L) 10/30/2019 0328   GFRNONAA 44 (L) 10/30/2019 0328   GFRAA 51 (L) 10/30/2019 0328    INR    Component Value Date/Time   INR 1.2 10/18/2019 0332     Intake/Output Summary (Last 24 hours) at 10/31/2019 0737 Last data filed at 10/31/2019 0644 Gross per 24 hour  Intake 2010.22 ml  Output 5275 ml  Net -3264.78 ml     Assessment/Plan:   69 y.o. female is s/p R CFA endarterectomy with vein patch angioplasty with subsequent groin debridements including muscle flap 7 Days Post-Op    R GT amp site stable; PTA signal by doppler Dakins wet to dry dressing changes BID R groin; will return later to premedicate and change dressing Continue IV heparin; transition back to Eliquis closer to d/c  ADDENDUM:  Dressing changed R groin.  Appearance is stable compared to picture on chart from yesterday; healthy appearing muscle at wound bed with perimeter fibrin exudate including abd skin.  We will continue current wound care for now.  Dagoberto Ligas, PA-C [facility] [phone] 10/31/2019 7:37 AM  I have seen and evaluated the patient. I agree with the PA note as documented above. Plan for dressing change to right groin today - will pre-medicate.  Dakins wet to dry BID.  Continue IV heparin.  Right PT signal, great toe amp stable.  Marty Heck, MD [facility] [place] Office: [phone] Pager: [phone]

## 2019-10-31 NOTE — Plan of Care (Signed)
  Problem: Clinical Measurements: Goal: Will remain free from infection Outcome: Not Progressing   Problem: Activity: Goal: Risk for activity intolerance will decrease Outcome: Not Progressing   Problem: Pain Managment: Goal: General experience of comfort will improve Outcome: Not Progressing   Problem: Skin Integrity: Goal: Risk for impaired skin integrity will decrease Outcome: Not Progressing

## 2019-10-31 NOTE — Plan of Care (Signed)
  Problem: Activity: Goal: Risk for activity intolerance will decrease Outcome: Not Progressing   Problem: Pain Managment: Goal: General experience of comfort will improve Outcome: Not Progressing   Problem: Skin Integrity: Goal: Risk for impaired skin integrity will decrease Outcome: Not Progressing   Problem: Elimination: Goal: Will not experience complications related to urinary retention Outcome: Not Applicable

## 2019-10-31 NOTE — Care Management Important Message (Signed)
Important Message  Patient Details  Name: Lauren [name] MRN: [id number] Date of Birth: [birth year]   Medicare Important Message Given:  Yes     Memory Argue 10/31/2019, 4:22 PM

## 2019-10-31 NOTE — Progress Notes (Signed)
ANTICOAGULATION CONSULT NOTE - Follow Up Consult  Pharmacy Consult for Heparin (Eliquis on hold) Indication: history of DVT  Allergies  Allergen Reactions  . Ambien [Zolpidem Tartrate] Other (See Comments)    Per the pt, "it makes me go crazy"  . Sulfa Antibiotics Rash  . Ativan [Lorazepam] Other (See Comments)    Patient has the opposite effect when taking medication   . Elavil [Amitriptyline Hcl] Other (See Comments)    Gastritis  . Prozac [Fluoxetine Hcl] Anxiety and Other (See Comments)    irritability     Patient Measurements: Height: 5' 7.48" (171.4 cm) Weight: 205 lb 4 oz (93.1 kg) IBW/kg (Calculated) : 62.7 Heparin Dosing Weight: 83 kg  Vital Signs: Temp: 98.5 F (36.9 C) (11/02 0638) Temp Source: Oral (11/02 [id number]) BP: 106/81 (11/02 1031) Pulse Rate: 94 (11/02 0638)  Labs: Recent Labs    10/29/19 0303 10/30/19 0328 10/30/19 1455 10/31/19 0301  HGB 7.4* 7.1*  --   --   HCT 24.5* 23.6*  --   --   PLT 578* 573*  --   --   HEPARINUNFRC 0.31 0.59 0.34 0.43  CREATININE  --  1.24*  --   --     Estimated Creatinine Clearance: 50.6 mL/min (A) (by C-G formula based on SCr of 1.24 mg/dL (H)).  Assessment:  4 yoF on Eliquis PTA for hx DVT, last dose was 10/14 pm and pt has been transitioned to heparin given multiple procedures needed and wound vac in place.   Heparin level remains therapeutic (0.43) on 1150 units/hr.  Hgb has been low, no CBC today.  No bleeding reported.  Goal of Therapy:  Heparin level 0.3-0.7 units/ml Monitor platelets by anticoagulation protocol: Yes   Plan:   Continue heparin drip at 1150 units/hr.  Daily heparin level and CBC.  Noted plan to resume Eliquis closer to discharge.  Arty Baumgartner, [place] Pager: [phone] or phone: [phone] 10/31/2019,12:41 PM

## 2019-10-31 NOTE — Progress Notes (Signed)
Physical Therapy Treatment Patient Details Name: Lauren [name] MRN: [id number] DOB: May 02, 1951 Today's Date: 10/31/2019    History of Present Illness Pt is a 68 y.o. female admitted 09/29/19 with worsening pain R foot and great toe. S/p bilateral SFA endarterectomies 10/1. Now s/p RCFEA now with wound debridement and great toe amp 10/13. S/p R groin I&D and sartoirus muscle flap with wound vac placed 10/15. To OR for additional groin I&D on 10/20. s/p multiple I&Ds of right groin 10/23, 10/26. PMH includes HTN, HLD, COPD, DM, CAD.    PT Comments    Pt responded well to BLE exercise, noting some pain on the R with heel slides and SLR. She refused to sit EOB so unable to progress bed mobility at this time but tolerated BUE horizontal ABD and punches well, denying pain with both. She required min vc/tc with exercises today. She is agreeable with plan to progress bed mobility tomorrow.   Follow Up Recommendations  SNF;Supervision for mobility/OOB     Equipment Recommendations       Recommendations for Other Services       Precautions / Restrictions Precautions Precautions: Fall Precaution Comments: R groin wound vac Restrictions Weight Bearing Restrictions: No Other Position/Activity Restrictions: WB through R heel, L orthotic and shoe for OOB    Mobility  Bed Mobility               General bed mobility comments: NT due to refusal to sit EOB  Transfers                 General transfer comment: NT due to refusal to sit EOB  Ambulation/Gait             General Gait Details: NT due to refusal to sit EOB   Stairs             Wheelchair Mobility    Modified Rankin (Stroke Patients Only)       Balance                                            Cognition Arousal/Alertness: Awake/alert Behavior During Therapy: WFL for tasks assessed/performed   Area of Impairment: Orientation;Attention;Memory;Following  commands;Safety/judgement;Awareness;Problem solving                   Current Attention Level: Sustained   Following Commands: Follows one step commands with increased time Safety/Judgement: Decreased awareness of deficits;Decreased awareness of safety Awareness: Intellectual Problem Solving: Requires verbal cues;Requires tactile cues;Decreased initiation General Comments: Cognition appeared to be more intact today; pt unwilling to get to EOB but reluctantly agreed to exercise.      Exercises General Exercises - Lower Extremity Ankle Circles/Pumps: AAROM;Both;5 reps Heel Slides: AAROM;Both;10 reps Hip ABduction/ADduction: AROM;10 reps;Both Straight Leg Raises: AROM;Right;5 reps;Left;10 reps Other Exercises Other Exercises: Level 1 theraband affixed to each bed rail for BUE horizontal abduction. Pt completed 10 reps.  Other Exercises: Level 1 theraband affixed to each bed rail for BUE punches. Pt completed 10 reps.    General Comments        Pertinent Vitals/Pain Pain Assessment: Faces Faces Pain Scale: Hurts whole lot Pain Location: BLE  Pain Descriptors / Indicators: Grimacing;Guarding;Moaning Pain Intervention(s): Limited activity within patient's tolerance;Monitored during session;Repositioned;RN gave pain meds during session    Home Living  Prior Function            PT Goals (current goals can now be found in the care plan section) Progress towards PT goals: Progressing toward goals    Frequency    Min 2X/week      PT Plan Current plan remains appropriate    Co-evaluation              AM-PAC PT "6 Clicks" Mobility   Outcome Measure  Help needed turning from your back to your side while in a flat bed without using bedrails?: A Lot Help needed moving from lying on your back to sitting on the side of a flat bed without using bedrails?: A Lot Help needed moving to and from a bed to a chair (including a wheelchair)?:  Total Help needed standing up from a chair using your arms (e.g., wheelchair or bedside chair)?: Total Help needed to walk in hospital room?: Total Help needed climbing 3-5 steps with a railing? : Total 6 Click Score: 8    End of Session   Activity Tolerance: Patient tolerated treatment well;Patient limited by pain Patient left: in bed;with call bell/phone within reach;with bed alarm set Nurse Communication: Mobility status PT Visit Diagnosis: Muscle weakness (generalized) (M62.81);Unsteadiness on feet (R26.81);Pain;Other abnormalities of gait and mobility (R26.89) Pain - Right/Left: Right Pain - part of body: Leg     Time: [id number] PT Time Calculation (min) (ACUTE ONLY): 14 min  Charges:  $Therapeutic Exercise: 8-22 mins                     Silvana Newness, SPT  [phone]  Silvana Newness 10/31/2019, 1:47 PM

## 2019-11-01 LAB — CBC
HCT: 24.8 % — ABNORMAL LOW (ref 36.0–46.0)
Hemoglobin: 7.2 g/dL — ABNORMAL LOW (ref 12.0–15.0)
MCH: 26.2 pg (ref 26.0–34.0)
MCHC: 29 g/dL — ABNORMAL LOW (ref 30.0–36.0)
MCV: 90.2 fL (ref 80.0–100.0)
Platelets: 580 10*3/uL — ABNORMAL HIGH (ref 150–400)
RBC: 2.75 MIL/uL — ABNORMAL LOW (ref 3.87–5.11)
RDW: 17.2 % — ABNORMAL HIGH (ref 11.5–15.5)
WBC: 11.9 10*3/uL — ABNORMAL HIGH (ref 4.0–10.5)
nRBC: 0 % (ref 0.0–0.2)

## 2019-11-01 LAB — HEPARIN LEVEL (UNFRACTIONATED): Heparin Unfractionated: 0.62 IU/mL (ref 0.30–0.70)

## 2019-11-01 LAB — GLUCOSE, CAPILLARY
Glucose-Capillary: 109 mg/dL — ABNORMAL HIGH (ref 70–99)
Glucose-Capillary: 149 mg/dL — ABNORMAL HIGH (ref 70–99)
Glucose-Capillary: 114 mg/dL — ABNORMAL HIGH (ref 70–99)
Glucose-Capillary: 114 mg/dL — ABNORMAL HIGH (ref 70–99)

## 2019-11-01 NOTE — Progress Notes (Addendum)
[facility] [place]  Subjective  - No new complaints, pain with dressing changes un changed.   Objective 139/68 85 98.2 F (36.8 C) (Oral) 18 93%  Intake/Output Summary (Last 24 hours) at 11/01/2019 0740 Last data filed at 11/01/2019 0148 Gross per 24 hour  Intake 1832.36 ml  Output 3350 ml  Net -1517.64 ml    Right groin with viable muscle good granulation tissue  Superior abdominal skin with superficial skin tears.  Old dressing with green drainage.  Wet to dry with Dakin solution.   Right GT amp site with dusky plantar surface, doppler PT signal Lungs non labored breathing   Assessment/Planning: 68 y.o. female is s/p R CFA endarterectomy with vein patch angioplasty with subsequent groin debridements including muscle flap  Groin with granulation tissue, wet to dry with Dakins BID changes requiring IV pain medication prior. Right GT amp site stable for now without openings in the skin, dusky plantar to incision.  Sutures maintained. Cont. Current treatment plan.   Hx of DVT was on Eliquis at home, currently being maintained on IV Heparin.  Roxy Horseman 11/01/2019 7:40 AM --  Laboratory Lab Results: Recent Labs    10/30/19 0328 11/01/19 0247  WBC 15.0* 11.9*  HGB 7.1* 7.2*  HCT 23.6* 24.8*  PLT 573* 580*   BMET Recent Labs    10/30/19 0328  NA 134*  K 4.2  CL 101  CO2 22  GLUCOSE 131*  BUN 19  CREATININE 1.24*  CALCIUM 8.5*    COAG Lab Results  Component Value Date   INR 1.2 10/18/2019   INR 1.2 09/29/2019   INR 1.1 09/15/2019   No results found for: PTT   I have seen and evaluated the patient. I agree with the PA note as documented above. Dressing changed this am with PA.  Wound stable.  Right great toe amp site stable.  Will evaluate wound again in am.   Continue pain control.  Dakins dressings to groin.    Marty Heck, MD [facility] [place] Office: [phone] Pager:  [phone]

## 2019-11-01 NOTE — Progress Notes (Signed)
PT Cancellation Note  Patient Details Name: Lauren [name] MRN: [id number] DOB: 1951/04/06   Cancelled Treatment:    Reason Eval/Treat Not Completed: Patient declined, no reason specified;Pain limiting ability to participate Patient declines working with therapy despite promising therapist yesterday she would sit EOB. Attempted to provide education and importance of mobility. Agrees to get transferred to chair tomorrow via maxi lift with nursing. Will follow.   Marguarite Arbour A Mennie Spiller 11/01/2019, 12:02 PM Marisa Severin, PT, DPT Acute Rehabilitation Services Pager [phone] Office [phone]

## 2019-11-01 NOTE — Progress Notes (Signed)
Nutrition Follow up  DOCUMENTATION CODES:   Obesity unspecified  INTERVENTION:   Intake has not progressed throughout admission. Would recommend Cortrak placement with initiation of enteral nutrition if within [employer].    Add Magic cup TID with meals, each supplement provides 290 kcal and 9 grams of protein  Continue MVI daily   Obtain recent weight  NUTRITION DIAGNOSIS:   Increased nutrient needs related to post-op healing as evidenced by estimated needs.  Ongoing  GOAL:   Patient will meet greater than or equal to 90% of their needs   Not met   MONITOR:   PO intake, Supplement acceptance, Weight trends, Labs, I & O's  REASON FOR ASSESSMENT:   Other (Comment)(LOS)    ASSESSMENT:   Patient with PMH significant for HTN, HLD, DM, severe COPD, cor pulmonale, and CAD. Presents this admission with R lower extremity critical limb ischemia.   10/1- R iliofemoral endart w/ vein angioplasty 10/13-R groin debridement, R great toe amputation 10/15- R groin debridement, wound VAC 10/17- R groin debridement, change wound VAC 10/20 R groin debridement, change wound VAC 10/23- R groin debridement, change wound VAC  10/26- R groin debridement, change wound VAC   RD working remotely.  Spoke with RN. Intake remains inconsistent. Meal completions charted as 10-50% for pt's last eight meals. Refuses all supplements from hospital but will sip Atkins shakes husband brings. Discussed possibility of Cortrak to help promote healing and progression. RN unsure if that would be within [place]. Will attempt to discuss with VS.   Admission weight: 90.7 kg  Current weight: 93.1 kg (last obtained 10/20)  I/O: -18,740 ml since 10/20 UOP: 3,350 ml x 24 hrs   Medications: colace, folic acid, ferrous sulfate, 40 mg lasix BID, SS novolog, lantus, Mag-ox, MVI with iron, miralax, 10 mEq KCL BID Labs: Na 134 (L) CBG 133-165   Diet Order:   Diet Order            Diet Carb Modified Fluid  consistency: Thin; Room service appropriate? Yes with Assist  Diet effective now              EDUCATION NEEDS:   Education needs have been addressed  Skin:  Skin Assessment: Skin Integrity Issues: Skin Integrity Issues:: Wound VAC, Incisions, Stage II Stage II: buttocks Wound Vac: R groin Incisions: R foot/ R groin  Last BM:  11/1  Height:   Ht Readings from Last 1 Encounters:  10/18/19 5' 7.48" (1.714 m)    Weight:   Wt Readings from Last 1 Encounters:  10/18/19 93.1 kg    Ideal Body Weight:  63.6 kg  BMI:  Body mass index is 31.69 kg/m.  Estimated Nutritional Needs:   Kcal:  1700-1900 kcal  Protein:  85-90 grams  Fluid:  >/= 1.7 L/day   Mariana Single RD, LDN Clinical Nutrition Pager # - [phone]

## 2019-11-01 NOTE — Progress Notes (Signed)
ANTICOAGULATION CONSULT NOTE - Follow Up Consult  Pharmacy Consult for Heparin (Eliquis on hold) Indication: history of DVT  Allergies  Allergen Reactions  . Ambien [Zolpidem Tartrate] Other (See Comments)    Per the pt, "it makes me go crazy"  . Sulfa Antibiotics Rash  . Ativan [Lorazepam] Other (See Comments)    Patient has the opposite effect when taking medication   . Elavil [Amitriptyline Hcl] Other (See Comments)    Gastritis  . Prozac [Fluoxetine Hcl] Anxiety and Other (See Comments)    irritability     Patient Measurements: Height: 5' 7.48" (171.4 cm) Weight: 205 lb 4 oz (93.1 kg) IBW/kg (Calculated) : 62.7 Heparin Dosing Weight: 83 kg  Vital Signs: Temp: 98.2 F (36.8 C) (11/03 0438) Temp Source: Oral (11/03 0438) BP: 115/71 (11/03 0813) Pulse Rate: 91 (11/03 0813)  Labs: Recent Labs    10/30/19 0328 10/30/19 1455 10/31/19 0301 11/01/19 0247  HGB 7.1*  --   --  7.2*  HCT 23.6*  --   --  24.8*  PLT 573*  --   --  580*  HEPARINUNFRC 0.59 0.34 0.43 0.62  CREATININE 1.24*  --   --   --     Estimated Creatinine Clearance: 50.6 mL/min (A) (by C-G formula based on SCr of 1.24 mg/dL (H)).  Assessment:  12 yoF on Eliquis PTA for hx DVT, last dose was 10/14 pm and pt has been transitioned to heparin given multiple procedures and wound vac in place.  HL therapeutic, H/H and plt stable.   Goal of Therapy:  Heparin level 0.3-0.7 units/ml Monitor platelets by anticoagulation protocol: Yes   Plan:  Continue heparin drip at 1150 units/hr Daily heparin level and CBC Resume Eliquis closer to discharge  Benetta Spar, PharmD, BCPS, [place] Pharmacist  Please check AMION for all [place] phone numbers After 10:00 PM, call [place]

## 2019-11-02 LAB — GLUCOSE, CAPILLARY
Glucose-Capillary: 90 mg/dL (ref 70–99)
Glucose-Capillary: 125 mg/dL — ABNORMAL HIGH (ref 70–99)
Glucose-Capillary: 118 mg/dL — ABNORMAL HIGH (ref 70–99)
Glucose-Capillary: 154 mg/dL — ABNORMAL HIGH (ref 70–99)

## 2019-11-02 LAB — CBC
HCT: 23.5 % — ABNORMAL LOW (ref 36.0–46.0)
Hemoglobin: 7 g/dL — ABNORMAL LOW (ref 12.0–15.0)
MCH: 26.1 pg (ref 26.0–34.0)
MCHC: 29.8 g/dL — ABNORMAL LOW (ref 30.0–36.0)
MCV: 87.7 fL (ref 80.0–100.0)
Platelets: 532 10*3/uL — ABNORMAL HIGH (ref 150–400)
RBC: 2.68 MIL/uL — ABNORMAL LOW (ref 3.87–5.11)
RDW: 16.9 % — ABNORMAL HIGH (ref 11.5–15.5)
WBC: 11.9 10*3/uL — ABNORMAL HIGH (ref 4.0–10.5)
nRBC: 0.2 % (ref 0.0–0.2)

## 2019-11-02 LAB — HEPARIN LEVEL (UNFRACTIONATED): Heparin Unfractionated: 0.51 IU/mL (ref 0.30–0.70)

## 2019-11-02 LAB — PREPARE RBC (CROSSMATCH)

## 2019-11-02 MED ORDER — SODIUM CHLORIDE 0.9% IV SOLUTION: Freq: Once | INTRAVENOUS | Status: DC

## 2019-11-02 NOTE — Progress Notes (Signed)
Occupational Therapy Treatment Patient Details Name: Lauren [name] MRN: [id number] DOB: [birth year] Today's Date: 11/02/2019    History of present illness Pt is a 68 y.o. female admitted 09/29/19 with worsening pain R foot and great toe. S/p bilateral SFA endarterectomies 10/1. Now s/p RCFEA now with wound debridement and great toe amp 10/13. S/p R groin I&D and sartoirus muscle flap with wound vac placed 10/15. To OR for additional groin I&D on 10/20. s/p multiple I&Ds of right groin 10/23, 10/26. PMH includes HTN, HLD, COPD, DM, CAD.   OT comments  Pt performing BUE progressive HEP with level I resistance band and AROM with no added resistance to incorporate pursed lip breathing with exercises. Pt able to recall BUE punches and shoulder adduction for theraband exercises from previous session earlier this week. Pt kept to bed level as pt unmotivated to get OOB until stating at end of session that she wants to get OOB eventually. Staff in room aware that pt is a lift. Spouse seem very supportive. Grooming at bedside in supine with HOB elevated with set-upA. Pt would benefit from continued OT skilled services for ADL, mobility and safety in SNF setting. OT following acutely.    Follow Up Recommendations  SNF;Supervision/Assistance - 24 hour    Equipment Recommendations       Recommendations for Other Services      Precautions / Restrictions Precautions Precautions: Fall Precaution Comments: R groin wound vac Restrictions Other Position/Activity Restrictions: WB through R heel, L orthotic and shoe for OOB       Mobility Bed Mobility Overal bed mobility: Needs Assistance Bed Mobility: Rolling Rolling: +2 for physical assistance;Mod assist         General bed mobility comments: pt rolling to R side and back to middle to perform HEP  Transfers                      Balance Overall balance assessment: Needs assistance                                         ADL either performed or assessed with clinical judgement   ADL Overall ADL's : Needs assistance/impaired     Grooming: Set up;Bed level Grooming Details (indicate cue type and reason): brushing hair in bed                             Functional mobility during ADLs: (deferred, no +2) General ADL Comments: bed level; BUE HEP with light resistance     Vision   Vision Assessment?: No apparent visual deficits   Perception     Praxis      Cognition Arousal/Alertness: Awake/alert Behavior During Therapy: WFL for tasks assessed/performed Overall Cognitive Status: No family/caregiver present to determine baseline cognitive functioning Area of Impairment: Orientation;Attention;Memory;Following commands;Safety/judgement;Awareness;Problem solving                   Current Attention Level: Sustained   Following Commands: Follows one step commands with increased time Safety/Judgement: Decreased awareness of deficits;Decreased awareness of safety Awareness: Intellectual Problem Solving: Decreased initiation General Comments: Pt has long pauses before initiating movement, but often likes to joke a lot. Pt follows 90% of commands with 100% accuracy        Exercises General Exercises - Upper Extremity Shoulder Flexion: AROM;10 reps;Right;Left;Supine Shoulder ABduction:  AROM;Right;Left;10 reps;Supine Elbow Flexion: AROM;10 reps;Both;Supine Elbow Extension: AROM;10 reps;Both;Supine Wrist Flexion: AROM;Both;10 reps;Supine Wrist Extension: AROM;10 reps;Both;Supine Digit Composite Flexion: AROM;Both;10 reps;Supine Composite Extension: AROM;Both;10 reps;Supine Other Exercises Other Exercises: L1 theraband with exercises listed above   Shoulder Instructions       General Comments pt's spouse in room able to encourage pt to perform BUE HEP.    Pertinent Vitals/ Pain       Pain Assessment: Faces Faces Pain Scale: Hurts little more Pain Location: groin Pain  Descriptors / Indicators: Grimacing;Guarding;Moaning Pain Intervention(s): Limited activity within patient's tolerance  Home Living                                          Prior Functioning/Environment              Frequency  Min 2X/week        Progress Toward Goals  OT Goals(current goals can now be found in the care plan section)  Progress towards OT goals: Progressing toward goals  Acute Rehab OT Goals Patient Stated Goal: I want to go home with my husband OT Goal Formulation: With patient Time For Goal Achievement: 11/16/19 Potential to Achieve Goals: Good ADL Goals Pt Will Transfer to Toilet: with min assist;stand pivot transfer;bedside commode;regular height toilet Pt Will Perform Toileting - Clothing Manipulation and hygiene: with min assist;sitting/lateral leans;sit to/from stand Pt Will Perform Tub/Shower Transfer: with min assist;Stand pivot transfer;shower seat;grab bars Additional ADL Goal #1: Pt will complete bed mobility at min A level in preparation for safe BADL engagement  Plan Discharge plan remains appropriate    Co-evaluation                 AM-PAC OT "6 Clicks" Daily Activity     Outcome Measure   Help from another person eating meals?: A Little Help from another person taking care of personal grooming?: A Little Help from another person toileting, which includes using toliet, bedpan, or urinal?: Total Help from another person bathing (including washing, rinsing, drying)?: A Lot Help from another person to put on and taking off regular upper body clothing?: A Little Help from another person to put on and taking off regular lower body clothing?: Total 6 Click Score: 13    End of Session    OT Visit Diagnosis: Other abnormalities of gait and mobility (R26.89);Muscle weakness (generalized) (M62.81);Pain Pain - Right/Left: Right Pain - part of body: Leg   Activity Tolerance Patient tolerated treatment well;Patient  limited by pain;Treatment limited secondary to medical complications (Comment)   Patient Left in bed;with call bell/phone within reach;with bed alarm set;with family/visitor present   Nurse Communication Mobility status        Time: 1445-1500 OT Time Calculation (min): 15 min  Charges: OT General Charges $OT Visit: 1 Visit OT Treatments $Therapeutic Exercise: 8-22 mins  Darryl Nestle) Marsa Aris OTR/L Acute Rehabilitation Services Pager: [phone] Office: [place] 11/02/2019, 3:55 PM

## 2019-11-02 NOTE — Progress Notes (Signed)
ANTICOAGULATION CONSULT NOTE - Follow Up Consult  Pharmacy Consult for Heparin (Eliquis on hold) Indication: history of DVT  Allergies  Allergen Reactions  . Ambien [Zolpidem Tartrate] Other (See Comments)    Per the pt, "it makes me go crazy"  . Sulfa Antibiotics Rash  . Ativan [Lorazepam] Other (See Comments)    Patient has the opposite effect when taking medication   . Elavil [Amitriptyline Hcl] Other (See Comments)    Gastritis  . Prozac [Fluoxetine Hcl] Anxiety and Other (See Comments)    irritability     Patient Measurements: Height: 5' 7.48" (171.4 cm) Weight: 205 lb 4 oz (93.1 kg) IBW/kg (Calculated) : 62.7 Heparin Dosing Weight: 83 kg  Vital Signs: Temp: 97.8 F (36.6 C) (11/04 0439) Temp Source: Oral (11/04 0439) BP: 143/60 (11/04 0808) Pulse Rate: 86 (11/04 0439)  Labs: Recent Labs    10/31/19 0301 11/01/19 0247 11/02/19 0224  HGB  --  7.2* 7.0*  HCT  --  24.8* 23.5*  PLT  --  580* 532*  HEPARINUNFRC 0.43 0.62 0.51    Estimated Creatinine Clearance: 50.6 mL/min (A) (by C-G formula based on SCr of 1.24 mg/dL (H)).  Assessment: 74 yoF on Eliquis PTA for hx DVT, last dose was 10/14 pm and pt has been transitioned to heparin given multiple procedures and wound vac in place.  Heparin level therapeutic, Hg/Hct and plts stable. No active bleed issues documented.  Goal of Therapy:  Heparin level 0.3-0.7 units/ml Monitor platelets by anticoagulation protocol: Yes   Plan:  Continue heparin drip at 1150 units/hr Monitor daily heparin level and CBC, s/sx bleeding Resume Eliquis closer to discharge  Elicia Lamp, PharmD, BCPS Please check AMION for all [facility] contact numbers Clinical Pharmacist 11/02/2019 8:37 AM

## 2019-11-02 NOTE — Progress Notes (Signed)
[facility]  Subjective  - No complaints.  Pre-medicated for dressing change to right groin.   Objective (!) 143/60 86 97.8 F (36.6 C) (Oral) 17 97%  Intake/Output Summary (Last 24 hours) at 11/02/2019 0834 Last data filed at 11/02/2019 0600 Gross per 24 hour  Intake 4015.06 ml  Output 2425 ml  Net 1590.06 ml    Right groin dressing change as pictured below. Wound bed with good granulation tissue. Superior and inferior margin of wound with some necrotic tissue as seen below. Right PT signal, great toe amp stable right foot.      Laboratory Lab Results: Recent Labs    11/01/19 0247 11/02/19 0224  WBC 11.9* 11.9*  HGB 7.2* 7.0*  HCT 24.8* 23.5*  PLT 580* 532*   BMET No results for input(s): NA, K, CL, CO2, GLUCOSE, BUN, CREATININE, CALCIUM in the last 72 hours.  COAG Lab Results  Component Value Date   INR 1.2 10/18/2019   INR 1.2 09/29/2019   INR 1.1 09/15/2019   No results found for: PTT  Assessment/Planning:  68 year old female status post right common femoral artery endarterectomy with vein patch angioplasty and then multiple groin debridements after wound breakdown.  Dressing change at bedside this morning by myself and nursing.  Wound bed is actually looking very good with good granulation tissue.  There is necrotic wound edges superiorly and inferiorly as pictured above.  I am not excited to return to the OR for more debridement right now given that after every debridement there is additional necrosis of the skin edges almost immediately.  No fevers and white count stable at 11.  We will continue Dakin's dressing changes BID to right groin.  Has a PT signal in the right foot.  Toe amp site is stable.  Continue heparin gtt.  Hgb 7.0 today.  Marty Heck 11/02/2019 8:34 AM --

## 2019-11-03 LAB — CBC
HCT: 30 % — ABNORMAL LOW (ref 36.0–46.0)
Hemoglobin: 9.5 g/dL — ABNORMAL LOW (ref 12.0–15.0)
MCH: 27.2 pg (ref 26.0–34.0)
MCHC: 31.7 g/dL (ref 30.0–36.0)
MCV: 86 fL (ref 80.0–100.0)
Platelets: 564 10*3/uL — ABNORMAL HIGH (ref 150–400)
RBC: 3.49 MIL/uL — ABNORMAL LOW (ref 3.87–5.11)
RDW: 15.9 % — ABNORMAL HIGH (ref 11.5–15.5)
WBC: 11.3 10*3/uL — ABNORMAL HIGH (ref 4.0–10.5)
nRBC: 0 % (ref 0.0–0.2)

## 2019-11-03 LAB — GLUCOSE, CAPILLARY
Glucose-Capillary: 126 mg/dL — ABNORMAL HIGH (ref 70–99)
Glucose-Capillary: 207 mg/dL — ABNORMAL HIGH (ref 70–99)
Glucose-Capillary: 141 mg/dL — ABNORMAL HIGH (ref 70–99)
Glucose-Capillary: 168 mg/dL — ABNORMAL HIGH (ref 70–99)

## 2019-11-03 LAB — TYPE AND SCREEN
ABO/RH(D): B POS
Antibody Screen: NEGATIVE
Unit division: 0

## 2019-11-03 LAB — BPAM RBC
Blood Product Expiration Date: 202012112359
ISSUE DATE / TIME: 202011042203
Unit Type and Rh: 7300

## 2019-11-03 LAB — HEPARIN LEVEL (UNFRACTIONATED): Heparin Unfractionated: 0.42 IU/mL (ref 0.30–0.70)

## 2019-11-03 MED ORDER — ENSURE MAX PROTEIN PO LIQD
11.0000 [oz_av] | Freq: Every day | ORAL | Status: DC
  Administered 2019-11-04 – 2019-11-17 (×6): 11 [oz_av] via ORAL
  Filled 2019-11-03 (×18): qty 330

## 2019-11-03 MED ORDER — PRO-STAT SUGAR FREE PO LIQD
30.0000 mL | Freq: Two times a day (BID) | ORAL | Status: DC
  Administered 2019-11-03 – 2019-11-17 (×22): 30 mL via ORAL
  Filled 2019-11-03 (×24): qty 30

## 2019-11-03 NOTE — Progress Notes (Signed)
Physical Therapy Treatment Patient Details Name: Lauren [name] MRN: [id number] DOB: 01-Sep-1951 Today's Date: 11/03/2019    History of Present Illness Pt is a 68 y.o. female admitted 09/29/19 with worsening pain R foot and great toe. S/p bilateral SFA endarterectomies 10/1. Now s/p RCFEA now with wound debridement and great toe amp 10/13. S/p R groin I&D and sartoirus muscle flap with wound vac placed 10/15. To OR for additional groin I&D on 10/20. s/p multiple I&Ds of right groin 10/23, 10/26. PMH includes HTN, HLD, COPD, DM, CAD.    PT Comments    Pt was able to sit EOB today with mod A x 2.  Continues to be limited by pain. Unable to stand due to pain, weakness, and decreased bil knee ROM.  She was able to participate in EOB reaching activities to challenge core strength.  Cont to recommend maxi move with nursing for bed to chair transfers.  Goals were due to be updated - did decrease OOB goals due to pt's slow progress.     Follow Up Recommendations  SNF     Equipment Recommendations       Recommendations for Other Services       Precautions / Restrictions Precautions Precautions: Fall Restrictions RLE Weight Bearing: Non weight bearing LLE Weight Bearing: Non weight bearing Other Position/Activity Restrictions: WB through R heel, L orthotic and shoe for OOB    Mobility  Bed Mobility Overal bed mobility: Needs Assistance Bed Mobility: Rolling Rolling: +2 for physical assistance;Mod assist   Supine to sit: Max assist;+2 for physical assistance;HOB elevated   Sit to sidelying: Max assist;+2 for physical assistance;HOB elevated General bed mobility comments: cues for techniques; increased time for pain control  Transfers                 General transfer comment: Unable to participate in OOB transfers due to pain and unable to flex knees for sit to stand  Ambulation/Gait                 Stairs             Wheelchair Mobility    Modified  Rankin (Stroke Patients Only)       Balance Overall balance assessment: Needs assistance Sitting-balance support: Bilateral upper extremity supported;Feet supported Sitting balance-Leahy Scale: Poor Sitting balance - Comments: Initially required mod A but progressed to close SBA with UE support.  Worked on core/trunk activation reaching outside BOS 10 times forward.  Sat EOB for 8 minutes.     Standing balance-Leahy Scale: Zero                              Cognition Arousal/Alertness: Awake/alert Behavior During Therapy: WFL for tasks assessed/performed Overall Cognitive Status: Within Functional Limits for tasks assessed                                        Exercises      General Comments General comments (skin integrity, edema, etc.): Worked on gentle knee flexion stretch x 3 with 10 second hold: pt with limited tolerance due to pain      Pertinent Vitals/Pain Pain Assessment: Faces Faces Pain Scale: Hurts even more Pain Location: groin and buttock during transfers Pain Descriptors / Indicators: Grimacing;Guarding;Moaning Pain Intervention(s): Limited activity within patient's tolerance;Repositioned    Home Living  Prior Function            PT Goals (current goals can now be found in the care plan section) Acute Rehab PT Goals Patient Stated Goal: I want to go home with my husband PT Goal Formulation: With patient Time For Goal Achievement: 11/17/19 Potential to Achieve Goals: Fair Progress towards PT goals: Progressing toward goals(slowly)    Frequency    Min 2X/week      PT Plan Current plan remains appropriate;Other (comment)(Goals updated)    Co-evaluation              AM-PAC PT "6 Clicks" Mobility   Outcome Measure  Help needed turning from your back to your side while in a flat bed without using bedrails?: A Lot Help needed moving from lying on your back to sitting on the side of  a flat bed without using bedrails?: A Lot Help needed moving to and from a bed to a chair (including a wheelchair)?: Total Help needed standing up from a chair using your arms (e.g., wheelchair or bedside chair)?: Total Help needed to walk in hospital room?: Total Help needed climbing 3-5 steps with a railing? : Total 6 Click Score: 8    End of Session   Activity Tolerance: Patient limited by pain Patient left: in bed;with call bell/phone within reach Nurse Communication: Mobility status PT Visit Diagnosis: Muscle weakness (generalized) (M62.81);Unsteadiness on feet (R26.81);Pain;Other abnormalities of gait and mobility (R26.89)     Time: [id number] PT Time Calculation (min) (ACUTE ONLY): 25 min  Charges:  $Therapeutic Activity: 23-37 mins                     Maggie Font, PT Acute Rehab Services [phone]    Karlton Lemon 11/03/2019, 4:20 PM

## 2019-11-03 NOTE — Progress Notes (Signed)
Nutrition Follow up  DOCUMENTATION CODES:   Obesity unspecified  INTERVENTION:   Pt refused Cortrak- will continue with snacks and supplements.    30 ml Prostat BID, each supplement provides 100 kcals and 15 grams protein.   Ensure MAX Protein po once daily, each supplement provides 150 kcal and 30 grams of protein  Snacks BID  MVI daily   NUTRITION DIAGNOSIS:   Increased nutrient needs related to post-op healing as evidenced by estimated needs.  Ongoing  GOAL:   Patient will meet greater than or equal to 90% of their needs   Not met   MONITOR:   PO intake, Supplement acceptance, Weight trends, Labs, I & O's  REASON FOR ASSESSMENT:   Other (Comment)(LOS)    ASSESSMENT:   Patient with PMH significant for HTN, HLD, DM, severe COPD, cor pulmonale, and CAD. Presents this admission with R lower extremity critical limb ischemia.   10/1- R iliofemoral endart w/ vein angioplasty 10/13-R groin debridement, R great toe amputation 10/15- R groin debridement, wound VAC 10/17- R groin debridement, change wound VAC 10/20 R groin debridement, change wound VAC 10/23- R groin debridement, change wound VAC  10/26- R groin debridement, change wound VAC   Pt endorses appetite remains poor. Meal completions vary from 25-100% for her last eight meals. Discussed increased need for protein given wound status. Discussed possible Cortrak tube placement with initiation of enteral nutrition given prolonged poor PO intake. Pt refused and willing to try more supplements and snacks. RD to continue encouragement.   Admission weight: 90.7 kg  Current weight: 93.1 kg (last obtained 10/20)  I/O: -14,004 ml since 10/22 UOP: 1,950 ml x 24 hrs   Medications: colace, ferrous sulfate, folic acid, 40 mg lasix, SS novolog, lantus, Mag-Ox, MVI with iron, miralax, 10 mEq KCl BID, senokot Labs: CBG 90-207   Diet Order:   Diet Order            Diet Carb Modified Fluid consistency: Thin; Room  service appropriate? Yes with Assist  Diet effective now              EDUCATION NEEDS:   Education needs have been addressed  Skin:  Skin Assessment: Skin Integrity Issues: Skin Integrity Issues:: Wound VAC, Incisions, Stage II Stage II: buttocks Wound Vac: R groin Incisions: R foot/ R groin  Last BM:  11/4  Height:   Ht Readings from Last 1 Encounters:  10/18/19 5' 7.48" (1.714 m)    Weight:   Wt Readings from Last 1 Encounters:  10/18/19 93.1 kg    Ideal Body Weight:  63.6 kg  BMI:  Body mass index is 31.69 kg/m.  Estimated Nutritional Needs:   Kcal:  1700-1900 kcal  Protein:  85-90 grams  Fluid:  >/= 1.7 L/day   Mariana Single RD, LDN Clinical Nutrition Pager # - [phone]

## 2019-11-03 NOTE — Progress Notes (Addendum)
[facility]  Subjective  - No new complaints   Objective (!) 160/90 94 98.6 F (37 C) (Oral) 14 97%  Intake/Output Summary (Last 24 hours) at 11/03/2019 0751 Last data filed at 11/03/2019 0050 Gross per 24 hour  Intake 1024.33 ml  Output 1950 ml  Net -925.67 ml    Right groin changed this am by RN Right GT amp site without significant change Lungs non labored breathing  Assessment/Planning: 68 year old female status post right common femoral artery endarterectomy with vein patch angioplasty and then multiple groin debridements after wound breakdown.  HGB 9.5 after 1 unit PRBC 11/02/2019 Afebril Plan to change right groin dressing in the am. Cont. BID dressing changes with Dakin solution. Heparin currently will resume Eliquis at discharge    Roxy Horseman 11/03/2019 7:51 AM --  Laboratory Lab Results: Recent Labs    11/02/19 0224 11/03/19 0249  WBC 11.9* 11.3*  HGB 7.0* 9.5*  HCT 23.5* 30.0*  PLT 532* 564*   BMET No results for input(s): NA, K, CL, CO2, GLUCOSE, BUN, CREATININE, CALCIUM in the last 72 hours.  COAG Lab Results  Component Value Date   INR 1.2 10/18/2019   INR 1.2 09/29/2019   INR 1.1 09/15/2019   No results found for: PTT   I have seen and evaluated the patient. I agree with the PA note as documented above. Continue twice daily Dakins dressing changes to left groin.  Granulation tissue in wound base.    Marty Heck, MD [facility] Office: [phone] Pager: [phone]

## 2019-11-03 NOTE — Progress Notes (Signed)
Right groin wound changed per MD order.

## 2019-11-03 NOTE — Plan of Care (Signed)
Continue to monitor

## 2019-11-03 NOTE — Progress Notes (Signed)
ANTICOAGULATION CONSULT NOTE - Follow Up Consult  Pharmacy Consult for Heparin (Eliquis on hold) Indication: history of DVT  Allergies  Allergen Reactions  . Ambien [Zolpidem Tartrate] Other (See Comments)    Per the pt, "it makes me go crazy"  . Sulfa Antibiotics Rash  . Ativan [Lorazepam] Other (See Comments)    Patient has the opposite effect when taking medication   . Elavil [Amitriptyline Hcl] Other (See Comments)    Gastritis  . Prozac [Fluoxetine Hcl] Anxiety and Other (See Comments)    irritability     Patient Measurements: Height: 5' 7.48" (171.4 cm) Weight: 205 lb 4 oz (93.1 kg) IBW/kg (Calculated) : 62.7 Heparin Dosing Weight: 83 kg  Vital Signs: Temp: 98.6 F (37 C) (11/05 0507) Temp Source: Oral (11/05 0507) BP: 160/90 (11/05 0507) Pulse Rate: 94 (11/05 0507)  Labs: Recent Labs    11/01/19 0247 11/02/19 0224 11/03/19 0249  HGB 7.2* 7.0* 9.5*  HCT 24.8* 23.5* 30.0*  PLT 580* 532* 564*  HEPARINUNFRC 0.62 0.51 0.42    Estimated Creatinine Clearance: 50.6 mL/min (A) (by C-G formula based on SCr of 1.24 mg/dL (H)).  Assessment: 33 yoF on Eliquis PTA for hx DVT, last dose was 10/14 pm and pt has been transitioned to heparin given multiple procedures and wound vac in place.  Heparin level therapeutic, Hg/Hct improved s/p transfusion and plts stable. No active bleed issues documented.  Goal of Therapy:  Heparin level 0.3-0.7 units/ml Monitor platelets by anticoagulation protocol: Yes   Plan:  Continue heparin drip at 1150 units/hr Monitor daily heparin level and CBC, s/sx bleeding Resume Eliquis closer to discharge  Elicia Lamp, PharmD, BCPS Please check AMION for all [place] contact numbers Clinical Pharmacist 11/03/2019 8:35 AM

## 2019-11-04 LAB — GLUCOSE, CAPILLARY
Glucose-Capillary: 163 mg/dL — ABNORMAL HIGH (ref 70–99)
Glucose-Capillary: 184 mg/dL — ABNORMAL HIGH (ref 70–99)
Glucose-Capillary: 188 mg/dL — ABNORMAL HIGH (ref 70–99)
Glucose-Capillary: 233 mg/dL — ABNORMAL HIGH (ref 70–99)

## 2019-11-04 LAB — URINALYSIS, ROUTINE W REFLEX MICROSCOPIC
Bacteria, UA: NONE SEEN
Bilirubin Urine: NEGATIVE
Glucose, UA: NEGATIVE mg/dL
Hgb urine dipstick: NEGATIVE
Ketones, ur: NEGATIVE mg/dL
Nitrite: NEGATIVE
Protein, ur: NEGATIVE mg/dL
Specific Gravity, Urine: 1.01 (ref 1.005–1.030)
pH: 6 (ref 5.0–8.0)

## 2019-11-04 LAB — CBC
HCT: 31.6 % — ABNORMAL LOW (ref 36.0–46.0)
Hemoglobin: 9.7 g/dL — ABNORMAL LOW (ref 12.0–15.0)
MCH: 27.1 pg (ref 26.0–34.0)
MCHC: 30.7 g/dL (ref 30.0–36.0)
MCV: 88.3 fL (ref 80.0–100.0)
Platelets: 596 10*3/uL — ABNORMAL HIGH (ref 150–400)
RBC: 3.58 MIL/uL — ABNORMAL LOW (ref 3.87–5.11)
RDW: 16.6 % — ABNORMAL HIGH (ref 11.5–15.5)
WBC: 12.9 10*3/uL — ABNORMAL HIGH (ref 4.0–10.5)
nRBC: 0 % (ref 0.0–0.2)

## 2019-11-04 LAB — HEPARIN LEVEL (UNFRACTIONATED): Heparin Unfractionated: 0.31 IU/mL (ref 0.30–0.70)

## 2019-11-04 NOTE — Care Management Important Message (Signed)
Important Message  Patient Details  Name: Lauren [name] MRN: [id number] Date of Birth: [birth year]   Medicare Important Message Given:  Yes     [name] 11/04/2019, 2:47 PM

## 2019-11-04 NOTE — Progress Notes (Signed)
ANTICOAGULATION CONSULT NOTE - Follow Up Consult  Pharmacy Consult for Heparin (Eliquis on hold) Indication: history of DVT  Allergies  Allergen Reactions  . Ambien [Zolpidem Tartrate] Other (See Comments)    Per the pt, "it makes me go crazy"  . Sulfa Antibiotics Rash  . Ativan [Lorazepam] Other (See Comments)    Patient has the opposite effect when taking medication   . Elavil [Amitriptyline Hcl] Other (See Comments)    Gastritis  . Prozac [Fluoxetine Hcl] Anxiety and Other (See Comments)    irritability     Patient Measurements: Height: 5' 7.48" (171.4 cm) Weight: 205 lb 4 oz (93.1 kg) IBW/kg (Calculated) : 62.7 Heparin Dosing Weight: 83 kg  Vital Signs: Temp: 98.8 F (37.1 C) (11/06 0807) Temp Source: Oral (11/06 0807) BP: 126/82 (11/06 0807) Pulse Rate: 86 (11/06 0439)  Labs: Recent Labs    11/02/19 0224 11/03/19 0249 11/04/19 0233  HGB 7.0* 9.5* 9.7*  HCT 23.5* 30.0* 31.6*  PLT 532* 564* 596*  HEPARINUNFRC 0.51 0.42 0.31    Estimated Creatinine Clearance: 50.6 mL/min (A) (by C-G formula based on SCr of 1.24 mg/dL (H)).  Assessment: 3 yoF on Eliquis PTA for hx DVT, last dose was 10/14 pm and pt has been transitioned to heparin given multiple procedures and wound vac in place.  Heparin level therapeutic but trending down near bottom of range at 0.31, Hg/Hct improved s/p transfusion and plt stable. No bleeding or issues with infusion per discussion with RN.  Goal of Therapy:  Heparin level 0.3-0.7 units/ml Monitor platelets by anticoagulation protocol: Yes   Plan:  Increase heparin drip slightly to 1250 units/hr to ensure stays in range Monitor daily heparin level and CBC, s/sx bleeding Resume Eliquis closer to discharge   Elicia Lamp, PharmD, BCPS Please check AMION for all [place] contact numbers Clinical Pharmacist 11/04/2019 8:48 AM

## 2019-11-04 NOTE — Consult Note (Signed)
[facility] Nurse wound consult note  Reason for Consult: Re-assess periwound skin around right groin wound. MEdical adhesive related, linear full thickness breakdown to this area.  Thin slough is present.  Area is moist. I will implement Interdry skin fold management system to wick moisture but promote autolysis  Wound type:Medical adhesive related skin injury Pressure Injury POA: NA Measurement: 3 linear abrasions, 0.5 cm x 0.2 cm slough wound bed Wound [id number] Drainage (amount, consistency, odor) minimal serosanguinous  Within skin fold, stays moist Periwound:see above Dressing procedure/placement/frequency: Interdry to right abdominal pannus:  Measure and cut length of InterDry to fit in skin folds that have skin breakdown Tuck InterDry fabric into skin folds in a single layer, allow for 2 inches of overhang from skin edges to allow for wicking to occur May remove to bathe; dry area thoroughly and then tuck into affected areas again Do not apply any creams or ointments when using InterDry DO NOT THROW AWAY FOR 5 DAYS unless soiled with stool DO NOT [facility] product, this will inactivate the silver in the material  New sheet of Interdry should be applied after 5 days of use if patient continues to have skin breakdown   Will not follow at this time.  Please re-consult if needed.  Domenic Moras MSN, RN, FNP-BC CWON Wound, Ostomy, Continence Nurse Pager [phone]

## 2019-11-04 NOTE — Progress Notes (Signed)
InterDry placed in abdominal skin fold per WOC RN order. Call bell in reach. Will continue to monitor.   Arletta Bale, RN

## 2019-11-04 NOTE — Progress Notes (Addendum)
[facility] [place]  Subjective  - No new complaints.   Objective (!) 143/83 86 98.4 F (36.9 C) (Oral) 14 97%  Intake/Output Summary (Last 24 hours) at 11/04/2019 0806 Last data filed at 11/04/2019 [id number] Gross per 24 hour  Intake 375 ml  Output 2500 ml  Net -2125 ml    Groin skin edges with dark discoloration  Muscle with beefy red base.  Dakins wet to dry changes. Abdominal wall with increased skin breakdown. Right GT amp healing no new changes   Assessment/Planning: 68 year old female status post right common femoral artery endarterectomy with vein patch angioplasty and then multiple groin debridements after wound breakdown.  Cont. BID dressing changes with Dakin solution. Will consult wound care for advice on protecting abdominal wall from further breakdown.   Heparin currently will resume Eliquis at discharge WBC are elevated no 12.9 from 11.3.  She is afebrile.   She has been on Cipro for UTI for 9 days. I will repeat a UA.    Roxy Horseman 11/04/2019 8:06 AM --  Laboratory Lab Results: Recent Labs    11/03/19 0249 11/04/19 0233  WBC 11.3* 12.9*  HGB 9.5* 9.7*  HCT 30.0* 31.6*  PLT 564* 596*   BMET No results for input(s): NA, K, CL, CO2, GLUCOSE, BUN, CREATININE, CALCIUM in the last 72 hours.  COAG Lab Results  Component Value Date   INR 1.2 10/18/2019   INR 1.2 09/29/2019   INR 1.1 09/15/2019   No results found for: PTT  I have seen and evaluated the patient. I agree with the PA note as documented above.  Right groin wound bed continues to have good granulation tissue.  There is some necrotic wound breakdown superiorly and inferiorly.  White blood count relatively stable and afebrile.  Have not made any immediate plans to go back to the OR given she has been clinically stable and every time we debride the wound edges they immediately become necrotic.  PT signal in the right foot.  Toe amp is stable.  Continue  heparin.  Marty Heck, MD [facility] [place] Office: [phone] Pager: [phone]

## 2019-11-04 NOTE — Progress Notes (Signed)
PTs family is requesting to be called if pt wants to speak with them. Orpah Greek (son in law ) [phone] or Veleta Miners (daughter) [phone]. Both are available 24/7.  Jerald Kief, RN

## 2019-11-05 LAB — GLUCOSE, CAPILLARY
Glucose-Capillary: 145 mg/dL — ABNORMAL HIGH (ref 70–99)
Glucose-Capillary: 142 mg/dL — ABNORMAL HIGH (ref 70–99)
Glucose-Capillary: 191 mg/dL — ABNORMAL HIGH (ref 70–99)
Glucose-Capillary: 87 mg/dL (ref 70–99)

## 2019-11-05 LAB — CBC
HCT: 28.6 % — ABNORMAL LOW (ref 36.0–46.0)
Hemoglobin: 8.9 g/dL — ABNORMAL LOW (ref 12.0–15.0)
MCH: 27.1 pg (ref 26.0–34.0)
MCHC: 31.1 g/dL (ref 30.0–36.0)
MCV: 87.2 fL (ref 80.0–100.0)
Platelets: 547 10*3/uL — ABNORMAL HIGH (ref 150–400)
RBC: 3.28 MIL/uL — ABNORMAL LOW (ref 3.87–5.11)
RDW: 16.6 % — ABNORMAL HIGH (ref 11.5–15.5)
WBC: 13.2 10*3/uL — ABNORMAL HIGH (ref 4.0–10.5)
nRBC: 0 % (ref 0.0–0.2)

## 2019-11-05 LAB — HEPARIN LEVEL (UNFRACTIONATED): Heparin Unfractionated: 0.49 IU/mL (ref 0.30–0.70)

## 2019-11-05 NOTE — Progress Notes (Signed)
ANTICOAGULATION CONSULT NOTE - Follow Up Consult  Pharmacy Consult for Heparin (Eliquis on hold) Indication: history of DVT  Allergies  Allergen Reactions  . Ambien [Zolpidem Tartrate] Other (See Comments)    Per the pt, "it makes me go crazy"  . Sulfa Antibiotics Rash  . Ativan [Lorazepam] Other (See Comments)    Patient has the opposite effect when taking medication   . Elavil [Amitriptyline Hcl] Other (See Comments)    Gastritis  . Prozac [Fluoxetine Hcl] Anxiety and Other (See Comments)    irritability     Patient Measurements: Height: 5' 7.48" (171.4 cm) Weight: 205 lb 4 oz (93.1 kg) IBW/kg (Calculated) : 62.7 Heparin Dosing Weight: 83 kg  Vital Signs: Temp: 98.6 F (37 C) (11/07 0805) Temp Source: Oral (11/07 0805) BP: 148/117 (11/07 0805) Pulse Rate: 96 (11/07 0805)  Labs: Recent Labs    11/03/19 0249 11/04/19 0233 11/05/19 0242  HGB 9.5* 9.7* 8.9*  HCT 30.0* 31.6* 28.6*  PLT 564* 596* 547*  HEPARINUNFRC 0.42 0.31 0.49    Estimated Creatinine Clearance: 50.6 mL/min (A) (by C-G formula based on SCr of 1.24 mg/dL (H)).  Assessment: 56 yoF on Eliquis PTA for hx DVT, last dose was 10/14 pm and pt has been transitioned to heparin given multiple procedures and wound vac in place.  Heparin level therapeutic at 0.49 on heparin 120 units/hr. Hgb decreased from 9.7 to 8.9. Plt 547. No reported bleeding per nursing and confirmed correct infusion rate.   Goal of Therapy:  Heparin level 0.3-0.7 units/ml Monitor platelets by anticoagulation protocol: Yes   Plan:  Continue heparin 1250 units/hr  Monitor daily heparin level and CBC, S/S of bleeding Resume Eliquis closer to discharge  Cristela Felt, PharmD PGY1 Pharmacy Resident [employer]: [phone]  11/05/2019 8:18 AM

## 2019-11-05 NOTE — Progress Notes (Signed)
Patient ID: Lauren [name], female   DOB: [birth year], 68 y.o.   MRN: [id number]  Progress Note    11/05/2019 8:23 AM 12 Days Post-Op  Subjective: Comfortable this morning.  Again discussed my sympathy with the death of her husband from ruptured aneurysm yesterday.  She seems to be dealing with this well.   Vitals:   11/05/19 0631 11/05/19 0805  BP: (!) 127/54 (!) 148/117  Pulse: 86 96  Resp: 16 15  Temp: 98.3 F (36.8 C) 98.6 F (37 C)  SpO2: 96% 97%   Physical Exam: Dressing not changed today.  Continuing twice daily dressing with nursing staff.  CBC    Component Value Date/Time   WBC 13.2 (H) 11/05/2019 0242   RBC 3.28 (L) 11/05/2019 0242   HGB 8.9 (L) 11/05/2019 0242   HGB 11.2 01/28/2018 1035   HGB 12.0 09/29/2011 1016   HCT 28.6 (L) 11/05/2019 0242   HCT 34.4 01/28/2018 1035   HCT 35.3 09/29/2011 1016   PLT 547 (H) 11/05/2019 0242   PLT 384 (H) 01/28/2018 1035   MCV 87.2 11/05/2019 0242   MCV 88 01/28/2018 1035   MCV 88.0 09/29/2011 1016   MCH 27.1 11/05/2019 0242   MCHC 31.1 11/05/2019 0242   RDW 16.6 (H) 11/05/2019 0242   RDW 17.6 (H) 01/28/2018 1035   RDW 14.8 (H) 09/29/2011 1016   LYMPHSABS 1.6 10/14/2019 0529   LYMPHSABS 0.6 (L) 09/29/2011 1016   MONOABS 1.7 (H) 10/14/2019 0529   MONOABS 0.8 09/29/2011 1016   EOSABS 0.1 10/14/2019 0529   EOSABS 0.3 09/29/2011 1016   BASOSABS 0.1 10/14/2019 0529   BASOSABS 0.1 09/29/2011 1016    BMET    Component Value Date/Time   NA 134 (L) 10/30/2019 0328   NA 142 01/28/2018 1035   K 4.2 10/30/2019 0328   CL 101 10/30/2019 0328   CO2 22 10/30/2019 0328   GLUCOSE 131 (H) 10/30/2019 0328   BUN 19 10/30/2019 0328   BUN 19 01/28/2018 1035   BUN 14.7 07/18/2014 1142   CREATININE 1.24 (H) 10/30/2019 0328   CREATININE 1.0 07/18/2014 1142   CALCIUM 8.5 (L) 10/30/2019 0328   GFRNONAA 44 (L) 10/30/2019 0328   GFRAA 51 (L) 10/30/2019 0328    INR    Component Value Date/Time   INR 1.2 10/18/2019 0332      Intake/Output Summary (Last 24 hours) at 11/05/2019 0823 Last data filed at 11/05/2019 0631 Gross per 24 hour  Intake 411.17 ml  Output 1475 ml  Net -1063.83 ml     Assessment/Plan:  68 y.o. female with a complex right groin wound.  Continue dressing changes     Rosetta Posner, MD [facility] [phone] 11/05/2019 8:23 AM

## 2019-11-06 LAB — GLUCOSE, CAPILLARY
Glucose-Capillary: 136 mg/dL — ABNORMAL HIGH (ref 70–99)
Glucose-Capillary: 223 mg/dL — ABNORMAL HIGH (ref 70–99)
Glucose-Capillary: 157 mg/dL — ABNORMAL HIGH (ref 70–99)
Glucose-Capillary: 111 mg/dL — ABNORMAL HIGH (ref 70–99)

## 2019-11-06 LAB — CBC
HCT: 30.7 % — ABNORMAL LOW (ref 36.0–46.0)
Hemoglobin: 9.4 g/dL — ABNORMAL LOW (ref 12.0–15.0)
MCH: 26.9 pg (ref 26.0–34.0)
MCHC: 30.6 g/dL (ref 30.0–36.0)
MCV: 88 fL (ref 80.0–100.0)
Platelets: 604 10*3/uL — ABNORMAL HIGH (ref 150–400)
RBC: 3.49 MIL/uL — ABNORMAL LOW (ref 3.87–5.11)
RDW: 16.9 % — ABNORMAL HIGH (ref 11.5–15.5)
WBC: 13.2 10*3/uL — ABNORMAL HIGH (ref 4.0–10.5)
nRBC: 0 % (ref 0.0–0.2)

## 2019-11-06 LAB — HEPARIN LEVEL (UNFRACTIONATED): Heparin Unfractionated: 0.47 IU/mL (ref 0.30–0.70)

## 2019-11-06 NOTE — Progress Notes (Signed)
ANTICOAGULATION CONSULT NOTE - Follow Up Consult  Pharmacy Consult for Heparin (Eliquis on hold) Indication: history of DVT  Allergies  Allergen Reactions  . Ambien [Zolpidem Tartrate] Other (See Comments)    Per the pt, "it makes me go crazy"  . Sulfa Antibiotics Rash  . Ativan [Lorazepam] Other (See Comments)    Patient has the opposite effect when taking medication   . Elavil [Amitriptyline Hcl] Other (See Comments)    Gastritis  . Prozac [Fluoxetine Hcl] Anxiety and Other (See Comments)    irritability     Patient Measurements: Height: 5' 7.48" (171.4 cm) Weight: 205 lb 4 oz (93.1 kg) IBW/kg (Calculated) : 62.7 Heparin Dosing Weight: 83 kg  Vital Signs: Temp: 98 F (36.7 C) (11/08 0750) Temp Source: Oral (11/08 0750) BP: 126/72 (11/08 0750) Pulse Rate: 79 (11/08 0325)  Labs: Recent Labs    11/04/19 0233 11/05/19 0242 11/06/19 0306  HGB 9.7* 8.9* 9.4*  HCT 31.6* 28.6* 30.7*  PLT 596* 547* 604*  HEPARINUNFRC 0.31 0.49 0.47    Estimated Creatinine Clearance: 50.6 mL/min (A) (by C-G formula based on SCr of 1.24 mg/dL (H)).  Assessment: Lauren [name] on Eliquis PTA for hx DVT, last dose was 10/14 pm and patient was transitioned to heparin given multiple procedures and wound vac in place.  Heparin level of 0.47 is therapeutic on heparin 1250 units/hr. CBC stable. No reported bleeding.  Goal of Therapy:  Heparin level 0.3-0.7 units/ml Monitor platelets by anticoagulation protocol: Yes   Plan:  Continue heparin 1250 units/hr  Monitor heparin level, CBC, and S/S of bleeding daily Resume Eliquis closer to or on discharge per vascular recommendations  Cristela Felt, PharmD PGY1 Pharmacy Resident [employer]: [phone]  11/06/2019 9:49 AM

## 2019-11-06 NOTE — Plan of Care (Signed)
  Problem: Clinical Measurements: Goal: Ability to maintain clinical measurements within normal limits will improve Outcome: Progressing Goal: Will remain free from infection Outcome: Progressing Goal: Diagnostic test results will improve Outcome: Progressing Goal: Respiratory complications will improve Outcome: Progressing Goal: Cardiovascular complication will be avoided Outcome: Progressing   Problem: Activity: Goal: Risk for activity intolerance will decrease Outcome: Progressing   Problem: Coping: Goal: Level of anxiety will decrease Outcome: Progressing   Problem: Pain Managment: Goal: General experience of comfort will improve Outcome: Progressing   Problem: Skin Integrity: Goal: Risk for impaired skin integrity will decrease Outcome: Progressing

## 2019-11-06 NOTE — Progress Notes (Signed)
Patient ID: Lauren [name], female   DOB: [birth year], 68 y.o.   MRN: [id number]  Progress Note    11/06/2019 8:09 AM 13 Days Post-Op  Subjective: Comfortable.  Reports dressing changes mildly uncomfortable    Vitals:   11/06/19 0325 11/06/19 0750  BP: 128/82 126/72  Pulse: 79   Resp: 13 13  Temp: 98.9 F (37.2 C) 98 F (36.7 C)  SpO2: 95% 95%   Physical Exam: Groin dressing in place  CBC    Component Value Date/Time   WBC 13.2 (H) 11/06/2019 0306   RBC 3.49 (L) 11/06/2019 0306   HGB 9.4 (L) 11/06/2019 0306   HGB 11.2 01/28/2018 1035   HGB 12.0 09/29/2011 1016   HCT 30.7 (L) 11/06/2019 0306   HCT 34.4 01/28/2018 1035   HCT 35.3 09/29/2011 1016   PLT 604 (H) 11/06/2019 0306   PLT 384 (H) 01/28/2018 1035   MCV 88.0 11/06/2019 0306   MCV 88 01/28/2018 1035   MCV 88.0 09/29/2011 1016   MCH 26.9 11/06/2019 0306   MCHC 30.6 11/06/2019 0306   RDW 16.9 (H) 11/06/2019 0306   RDW 17.6 (H) 01/28/2018 1035   RDW 14.8 (H) 09/29/2011 1016   LYMPHSABS 1.6 10/14/2019 0529   LYMPHSABS 0.6 (L) 09/29/2011 1016   MONOABS 1.7 (H) 10/14/2019 0529   MONOABS 0.8 09/29/2011 1016   EOSABS 0.1 10/14/2019 0529   EOSABS 0.3 09/29/2011 1016   BASOSABS 0.1 10/14/2019 0529   BASOSABS 0.1 09/29/2011 1016    BMET    Component Value Date/Time   NA 134 (L) 10/30/2019 0328   NA 142 01/28/2018 1035   K 4.2 10/30/2019 0328   CL 101 10/30/2019 0328   CO2 22 10/30/2019 0328   GLUCOSE 131 (H) 10/30/2019 0328   BUN 19 10/30/2019 0328   BUN 19 01/28/2018 1035   BUN 14.7 07/18/2014 1142   CREATININE 1.24 (H) 10/30/2019 0328   CREATININE 1.0 07/18/2014 1142   CALCIUM 8.5 (L) 10/30/2019 0328   GFRNONAA 44 (L) 10/30/2019 0328   GFRAA 51 (L) 10/30/2019 0328    INR    Component Value Date/Time   INR 1.2 10/18/2019 0332     Intake/Output Summary (Last 24 hours) at 11/06/2019 0809 Last data filed at 11/06/2019 0400 Gross per 24 hour  Intake 962.42 ml  Output 1950 ml  Net -987.58 ml      Assessment/Plan:  68 y.o. female stable status post femoral endarterectomy and complex right groin wound.  Continue Dakin's twice daily dressing changes.     Rosetta Posner, MD FACS [facility] [phone] 11/06/2019 8:09 AM

## 2019-11-07 LAB — CBC
HCT: 31.1 % — ABNORMAL LOW (ref 36.0–46.0)
Hemoglobin: 9.4 g/dL — ABNORMAL LOW (ref 12.0–15.0)
MCH: 26.5 pg (ref 26.0–34.0)
MCHC: 30.2 g/dL (ref 30.0–36.0)
MCV: 87.6 fL (ref 80.0–100.0)
Platelets: 608 10*3/uL — ABNORMAL HIGH (ref 150–400)
RBC: 3.55 MIL/uL — ABNORMAL LOW (ref 3.87–5.11)
RDW: 16.9 % — ABNORMAL HIGH (ref 11.5–15.5)
WBC: 13.4 10*3/uL — ABNORMAL HIGH (ref 4.0–10.5)
nRBC: 0 % (ref 0.0–0.2)

## 2019-11-07 LAB — HEPARIN LEVEL (UNFRACTIONATED): Heparin Unfractionated: 0.58 IU/mL (ref 0.30–0.70)

## 2019-11-07 LAB — GLUCOSE, CAPILLARY
Glucose-Capillary: 153 mg/dL — ABNORMAL HIGH (ref 70–99)
Glucose-Capillary: 234 mg/dL — ABNORMAL HIGH (ref 70–99)
Glucose-Capillary: 178 mg/dL — ABNORMAL HIGH (ref 70–99)
Glucose-Capillary: 204 mg/dL — ABNORMAL HIGH (ref 70–99)

## 2019-11-07 NOTE — Progress Notes (Signed)
ANTICOAGULATION CONSULT NOTE - Follow Up Consult  Pharmacy Consult for Heparin (Eliquis on hold) Indication: history of DVT  Allergies  Allergen Reactions  . Ambien [Zolpidem Tartrate] Other (See Comments)    Per the pt, "it makes me go crazy"  . Sulfa Antibiotics Rash  . Ativan [Lorazepam] Other (See Comments)    Patient has the opposite effect when taking medication   . Elavil [Amitriptyline Hcl] Other (See Comments)    Gastritis  . Prozac [Fluoxetine Hcl] Anxiety and Other (See Comments)    irritability     Patient Measurements: Height: 5' 7.48" (171.4 cm) Weight: 205 lb 4 oz (93.1 kg) IBW/kg (Calculated) : 62.7 Heparin Dosing Weight: 83 kg  Vital Signs: Temp: 97.8 F (36.6 C) (11/09 0338) Temp Source: Oral (11/09 0338) BP: 105/74 (11/09 0338) Pulse Rate: 82 (11/09 0338)  Labs: Recent Labs    11/05/19 0242 11/06/19 0306 11/07/19 0258  HGB 8.9* 9.4* 9.4*  HCT 28.6* 30.7* 31.1*  PLT 547* 604* 608*  HEPARINUNFRC 0.49 0.47 0.58    Estimated Creatinine Clearance: 50.6 mL/min (A) (by C-G formula based on SCr of 1.24 mg/dL (H)).  Assessment: 67 yoF on Eliquis PTA for hx DVT, last dose was 10/14 pm and patient was transitioned to heparin given multiple procedures and wound vac in place.  Heparin level therapeutic at 0.58 this AM, CBC stable  Goal of Therapy:  Heparin level 0.3-0.7 units/ml Monitor platelets by anticoagulation protocol: Yes   Plan:  Continue heparin 1250 units/hr  Daily heparin level, CBC, s/s bleeding Resume Eliquis closer to or on discharge per vascular recommendations  Bertis Ruddy, PharmD Clinical Pharmacist Please check AMION for all [place] numbers 11/07/2019 8:31 AM

## 2019-11-07 NOTE — Progress Notes (Addendum)
[facility] [place]  Subjective  - Emotionally she is doing well today after the loss of her husband.   Objective 105/74 82 97.8 F (36.6 C) (Oral) 15 96%  Intake/Output Summary (Last 24 hours) at 11/07/2019 0725 Last data filed at 11/07/2019 0338 Gross per 24 hour  Intake 749.55 ml  Output 1650 ml  Net -900.45 ml   GT amp site appears to be healing well Right groin with healthy muscle bed good granulation.  Lateral skin edges dusky. 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  Wet to dry Dakins changed this am.  Patient tolerated well. Continue BID dressing changes Heparin currently will resume Eliquis at discharge WBC now 13.4 slowly elevating  She was on Cipro for 9 days this was stopped Friday 10/04/19.  She is afebrile and UA shows small amount of Leukocytosis.  Skin edges laterally dark/dusky.  Will discuss exam with Dr. Donzetta Matters.  Roxy Horseman 11/07/2019 7:25 AM --  Laboratory Lab Results: Recent Labs    11/06/19 0306 11/07/19 0258  WBC 13.2* 13.4*  HGB 9.4* 9.4*  HCT 30.7* 31.1*  PLT 604* 608*   BMET No results for input(s): NA, K, CL, CO2, GLUCOSE, BUN, CREATININE, CALCIUM in the last 72 hours.  COAG Lab Results  Component Value Date   INR 1.2 10/18/2019   INR 1.2 09/29/2019   INR 1.1 09/15/2019   No results found for: PTT  I have independently interviewed and examined patient and agree with PA assessment and plan above.  I had a long discussion with the patient given the loss of her husband over the weekend.  She is understandably quite saddened by this and would like to get  out of the hospital in the near future.  I will reevaluate the wound with PA and bedside nursing in the morning hopefully can avoid further debridement during this hospital course and can begin to look for placement outside of the hospital.  White blood cell count slowly uptrending and will continue to monitor.  Brandon C. Donzetta Matters, MD [facility] [place] Office: [phone] Pager: [phone]

## 2019-11-08 ENCOUNTER — Inpatient Hospital Stay (HOSPITAL_COMMUNITY): Payer: Medicare Other

## 2019-11-08 DIAGNOSIS — I70221 Atherosclerosis of native arteries of extremities with rest pain, right leg: Secondary | ICD-10-CM

## 2019-11-08 LAB — GLUCOSE, CAPILLARY
Glucose-Capillary: 154 mg/dL — ABNORMAL HIGH (ref 70–99)
Glucose-Capillary: 236 mg/dL — ABNORMAL HIGH (ref 70–99)
Glucose-Capillary: 101 mg/dL — ABNORMAL HIGH (ref 70–99)
Glucose-Capillary: 145 mg/dL — ABNORMAL HIGH (ref 70–99)

## 2019-11-08 LAB — CBC
HCT: 30.6 % — ABNORMAL LOW (ref 36.0–46.0)
Hemoglobin: 9.3 g/dL — ABNORMAL LOW (ref 12.0–15.0)
MCH: 26.6 pg (ref 26.0–34.0)
MCHC: 30.4 g/dL (ref 30.0–36.0)
MCV: 87.7 fL (ref 80.0–100.0)
Platelets: 629 K/uL — ABNORMAL HIGH (ref 150–400)
RBC: 3.49 MIL/uL — ABNORMAL LOW (ref 3.87–5.11)
RDW: 17 % — ABNORMAL HIGH (ref 11.5–15.5)
WBC: 14.9 K/uL — ABNORMAL HIGH (ref 4.0–10.5)
nRBC: 0 % (ref 0.0–0.2)

## 2019-11-08 LAB — HEPARIN LEVEL (UNFRACTIONATED): Heparin Unfractionated: 0.8 IU/mL — ABNORMAL HIGH (ref 0.30–0.70)

## 2019-11-08 MED ORDER — GERHARDT'S BUTT CREAM: TOPICAL_CREAM | CUTANEOUS | Status: DC | PRN

## 2019-11-08 NOTE — Progress Notes (Signed)
Verbal order to remove sutures in foot obtained from [facility]. 5 sutures removed from right foot as ordered. Will monitor patient. Yoselyn Mcglade, Bettina Gavia RN

## 2019-11-08 NOTE — Progress Notes (Signed)
Physical Therapy Treatment Patient Details Name: Lauren [name] MRN: [id number] DOB: 1951/10/20 Today's Date: 11/08/2019    History of Present Illness Pt is a 68 y.o. female admitted 09/29/19 with worsening pain R foot and great toe. S/p bilateral SFA endarterectomies 10/1. Now s/p RCFEA now with wound debridement and great toe amp 10/13. S/p R groin I&D and sartoirus muscle flap with wound vac placed 10/15. To OR for additional groin I&D on 10/20. s/p multiple I&Ds of right groin 10/23, 10/26. PMH includes HTN, HLD, COPD, DM, CAD.    PT Comments    Patient requires max encouragement and coaxing to participate in therapy session. Requires total A of 2 for bed mobility and initially Mod A for sitting balance progressing to min guard assist once stabilized on RUE. Worked on upright posture and activating trunk/core musculature. Pt with limited knee flexion PROM/AROM, attempted to perform gentle stretching however pt not able to tolerate due to pain. Also noted to have some cognitive deficits relating to orientation, safety, awareness, attention. Recommend nursing transfer pt OOB with lift. Will follow.    Follow Up Recommendations  SNF     Equipment Recommendations  Other (comment)(defer to next venue)    Recommendations for Other Services       Precautions / Restrictions Precautions Precautions: Fall Restrictions Weight Bearing Restrictions: Yes RLE Weight Bearing: Non weight bearing LLE Weight Bearing: Non weight bearing Other Position/Activity Restrictions: WB through R heel, L orthotic and shoe for OOB    Mobility  Bed Mobility Overal bed mobility: Needs Assistance Bed Mobility: Sidelying to Sit   Sidelying to sit: Total assist;+2 for physical assistance;HOB elevated     Sit to sidelying: Total assist;+2 for physical assistance;HOB elevated General bed mobility comments: Total A for all bed mobility with little participation/assist.  Transfers                  General transfer comment: Deferred, contracted bil knees into extension, limited flexion bilaterally, very painful  Ambulation/Gait                 Stairs             Wheelchair Mobility    Modified Rankin (Stroke Patients Only)       Balance Overall balance assessment: Needs assistance Sitting-balance support: Feet supported;Bilateral upper extremity supported Sitting balance-Leahy Scale: Poor Sitting balance - Comments: Initially required mod A but progressed to close Min guard with UE support- leaning on right forearm.  Worked on core/trunk activation and upright posture as well as activating triceps by pushing through RUE. Postural control: Posterior lean;Right lateral lean                                  Cognition Arousal/Alertness: Awake/alert Behavior During Therapy: WFL for tasks assessed/performed Overall Cognitive Status: Impaired/Different from baseline Area of Impairment: Orientation;Attention;Memory;Following commands;Safety/judgement;Awareness;Problem solving                 Orientation Level: Disoriented to;Time;Situation Current Attention Level: Sustained Memory: Decreased short-term memory Following Commands: Follows one step commands with increased time(and repetition) Safety/Judgement: Decreased awareness of deficits;Decreased awareness of safety Awareness: Intellectual Problem Solving: Decreased initiation;Requires verbal cues;Requires tactile cues General Comments: Thinks it is January and then February even with cues that Thanksgiving is coming up. Decreased initiation for all movement with max cues to stay attended to task and lots of repetition. Easily distracted. Jokes a lot to  try to distract from task.      Exercises Other Exercises Other Exercises: Worked on gentle stretching of bil knees into flexion but limited due to pain, likely contracted.    General Comments        Pertinent Vitals/Pain Pain  Assessment: Faces Faces Pain Scale: Hurts even more Pain Location: buttocks during mobility Pain Descriptors / Indicators: Grimacing;Guarding;Moaning Pain Intervention(s): Repositioned;Monitored during session;Limited activity within patient's tolerance    Home Living                      Prior Function            PT Goals (current goals can now be found in the care plan section) Progress towards PT goals: Not progressing toward goals - comment    Frequency    Min 2X/week      PT Plan Current plan remains appropriate    Co-evaluation PT/OT/SLP Co-Evaluation/Treatment: Yes Reason for Co-Treatment: Complexity of the patient's impairments (multi-system involvement);For patient/therapist safety;To address functional/ADL transfers PT goals addressed during session: Mobility/safety with mobility        AM-PAC PT "6 Clicks" Mobility   Outcome Measure  Help needed turning from your back to your side while in a flat bed without using bedrails?: Total Help needed moving from lying on your back to sitting on the side of a flat bed without using bedrails?: Total Help needed moving to and from a bed to a chair (including a wheelchair)?: Total Help needed standing up from a chair using your arms (e.g., wheelchair or bedside chair)?: Total Help needed to walk in hospital room?: Total Help needed climbing 3-5 steps with a railing? : Total 6 Click Score: 6    End of Session   Activity Tolerance: Patient limited by pain;Patient limited by fatigue Patient left: in bed;with call bell/phone within reach Nurse Communication: Mobility status;Need for lift equipment PT Visit Diagnosis: Muscle weakness (generalized) (M62.81);Unsteadiness on feet (R26.81);Pain;Other abnormalities of gait and mobility (R26.89) Pain - part of body: (buttocks)     Time: [id number] PT Time Calculation (min) (ACUTE ONLY): 26 min  Charges:  $Therapeutic Activity: 8-22 mins                      Marisa Severin, PT, DPT Acute Rehabilitation Services Pager [phone] Office [place] 11/08/2019, 10:52 AM

## 2019-11-08 NOTE — Progress Notes (Signed)
Per flowsheet, Foley was changed/replaced on 10/08/2019. Kaaren Nass, Bettina Gavia RN

## 2019-11-08 NOTE — Progress Notes (Addendum)
[facility]  Subjective  - No new complaints   Objective 114/76 90 98.4 F (36.9 C) (Oral) 13 95%  Intake/Output Summary (Last 24 hours) at 11/08/2019 0737 Last data filed at 11/07/2019 2227 Gross per 24 hour  Intake 360 ml  Output 1875 ml  Net -1515 ml    Right groin wound with beefy red muscle base.  Good granulation. Dakins wet to dry changed, interdry left in place to protect abdominal skin Wound care nurse documented   left buttock, close to the anus measures approximately 2 cm x 2 cm x an undetermined depth.  Right GT toe amputation site healing   Assessment/Planning: 68 year old female status post right common femoral artery endarterectomy with vein patch angioplasty and then multiple groin debridements after wound breakdown  Toe amputation was performed 10/11/2019 OK to remove sutures today   Chest xray ordered to explore causes of Leukocytosis.  UA negative.  Patient has in dwelling catheter.  Right groin is healing well over all with skin edge changes, no frank signs of infection.    Pending further work up.   Roxy Horseman 11/08/2019 7:37 AM --  Laboratory Lab Results: Recent Labs    11/07/19 0258 11/08/19 0229  WBC 13.4* 14.9*  HGB 9.4* 9.3*  HCT 31.1* 30.6*  PLT 608* 629*   BMET No results for input(s): NA, K, CL, CO2, GLUCOSE, BUN, CREATININE, CALCIUM in the last 72 hours.  COAG Lab Results  Component Value Date   INR 1.2 10/18/2019   INR 1.2 09/29/2019   INR 1.1 09/15/2019   No results found for: PTT   I have interviewed and examined patient with PA and agree with assessment and plan above.  Patient with increasing leukocytosis.  Urinalysis yesterday unrevealing as his chest x-ray today.  Wound with beefy red granulation does have fibrinous exudate at the edges but persistent debridement has been unsatisfactory in the past.  Given that wound is healing would avoid further debridement.  Disposition  pending leukocytosis improvement.  Brandon C. Donzetta Matters, MD [facility] Office: [phone] Pager: [phone]

## 2019-11-08 NOTE — Consult Note (Signed)
[facility] Nurse wound consult note Patient receiving care in [place].  Consult completed remotely after review of record and phone conversation with primary RN, Lovely.  Photo of area not available.  Reason for Consult: "sacral wound" Wound type: Per my phone conversation with Lovely, the area on the left buttock, close to the anus measures approximately 2 cm x 2 cm x an undetermined depth.  The area is white and has "some drainage".  Lovely expresses this area "could be" related to moisture. Dressing procedure/placement/frequency:  Place a small piece of Aquacel  Kellie Simmering [phone]) over the wound on the left buttock close to the anus. Cover with a foam dressing. I plan to evaluate the area in person 11/09/19. Val Riles, RN, MSN, CWOCN, CNS-BC, pager [phone]

## 2019-11-08 NOTE — Progress Notes (Signed)
[facility] for Heparin Indication: history of DVT  Allergies  Allergen Reactions  . Ambien [Zolpidem Tartrate] Other (See Comments)    Per the pt, "it makes me go crazy"  . Sulfa Antibiotics Rash  . Ativan [Lorazepam] Other (See Comments)    Patient has the opposite effect when taking medication   . Elavil [Amitriptyline Hcl] Other (See Comments)    Gastritis  . Prozac [Fluoxetine Hcl] Anxiety and Other (See Comments)    irritability     Patient Measurements: Height: 5' 7.48" (171.4 cm) Weight: 205 lb 4 oz (93.1 kg) IBW/kg (Calculated) : 62.7 Heparin Dosing Weight: 83 kg  Vital Signs: Temp: 98.4 F (36.9 C) (11/09 2223) Temp Source: Oral (11/09 2223) BP: 113/98 (11/09 2223) Pulse Rate: 84 (11/09 2223)  Labs: Recent Labs    11/06/19 0306 11/07/19 0258 11/08/19 0229  HGB 9.4* 9.4* 9.3*  HCT 30.7* 31.1* 30.6*  PLT 604* 608* 629*  HEPARINUNFRC 0.47 0.58 0.80*    Estimated Creatinine Clearance: 50.6 mL/min (A) (by C-G formula based on SCr of 1.24 mg/dL (H)).  Assessment: 68 yo female with h/o DVT, Eliquis on hold, for heparin Goal of Therapy:  Heparin level 0.3-0.7 units/ml Monitor platelets by anticoagulation protocol: Yes   Plan:  Decrease Heparin 1100 units/hr  Phillis Knack, PharmD, BCPS  11/08/2019 3:44 AM

## 2019-11-08 NOTE — Progress Notes (Signed)
Occupational Therapy Treatment Patient Details Name: Lauren [name] MRN: [id number] DOB: 11-30-1951 Today's Date: 11/08/2019    History of present illness Pt is a 68 y.o. female admitted 09/29/19 with worsening pain R foot and great toe. S/p bilateral SFA endarterectomies 10/1. Now s/p RCFEA now with wound debridement and great toe amp 10/13. S/p R groin I&D and sartoirus muscle flap with wound vac placed 10/15. To OR for additional groin I&D on 10/20. s/p multiple I&Ds of right groin 10/23, 10/26. PMH includes HTN, HLD, COPD, DM, CAD.   OT comments  Pt making minimal progress towards OT goals this session. Session focus on sitting balance as precursor to higher level ADLs. Pt required total A+2  for all aspect of bed mobility. Pt sat EOB with MOD A +2 initially progressing to min guard when cued to extend to BUE to support trunk. Overall, pt distracted throughout session joking with therapists to try to redirect from session. Pt required MAX encouragement to participate in seated UB ADL of brushing hair. Pt attended to task for ~ 30 seconds. DC plan remains appropriate, will continue to follow acutely per POC.    Follow Up Recommendations  SNF;Supervision/Assistance - 24 hour    Equipment Recommendations  Other (comment)(tbd to next venue of care)    Recommendations for Other Services      Precautions / Restrictions Precautions Precautions: Fall Restrictions Weight Bearing Restrictions: Yes RLE Weight Bearing: Non weight bearing LLE Weight Bearing: Non weight bearing Other Position/Activity Restrictions: WB through R heel, L orthotic and shoe for OOB       Mobility Bed Mobility Overal bed mobility: Needs Assistance Bed Mobility: Sidelying to Sit;Sit to Sidelying   Sidelying to sit: Total assist;+2 for physical assistance;HOB elevated     Sit to sidelying: Total assist;+2 for physical assistance;HOB elevated General bed mobility comments: Total A for all bed mobility with  little participation/assist.  Transfers                 General transfer comment: Deferred, contracted bil knees into extension, limited flexion bilaterally, very painful    Balance Overall balance assessment: Needs assistance Sitting-balance support: Feet supported;Bilateral upper extremity supported Sitting balance-Leahy Scale: Poor Sitting balance - Comments: Initially required mod A but progressed to close Min guard with UE support- leaning on right forearm.  Worked on core/trunk activation and upright posture as well as activating triceps by pushing through RUE. Postural control: Posterior lean;Right lateral lean                                 ADL either performed or assessed with clinical judgement   ADL Overall ADL's : Needs assistance/impaired     Grooming: Brushing hair;Sitting;Set up;Min guard Grooming Details (indicate cue type and reason): min guard for sittting EOB; MAX encouragment to participate                               General ADL Comments: session focus on sitting balance with pt requiring MOD A- MIN guard to sit EOB     Vision Patient Visual Report: No change from baseline Vision Assessment?: No apparent visual deficits   Perception     Praxis      Cognition Arousal/Alertness: Awake/alert Behavior During Therapy: WFL for tasks assessed/performed Overall Cognitive Status: Impaired/Different from baseline Area of Impairment: Orientation;Attention;Memory;Following commands;Safety/judgement;Awareness;Problem solving  Orientation Level: Disoriented to;Time;Situation Current Attention Level: Sustained Memory: Decreased short-term memory Following Commands: Follows one step commands with increased time Safety/Judgement: Decreased awareness of deficits;Decreased awareness of safety Awareness: Intellectual Problem Solving: Decreased initiation;Requires verbal cues;Requires tactile cues General  Comments: Thinks it is January and then February even with cues that Thanksgiving is coming up. Decreased initiation for all movement with max cues to stay attended to task and lots of repetition. Easily distracted. Jokes a lot to try to distract from task.        Exercises Other Exercises Other Exercises: Worked on gentle stretching of bil knees into flexion but limited due to pain, likely contracted.   Shoulder Instructions       General Comments      Pertinent Vitals/ Pain       Pain Assessment: Faces Faces Pain Scale: Hurts even more Pain Location: buttocks during mobility Pain Descriptors / Indicators: Grimacing;Guarding;Moaning Pain Intervention(s): Monitored during session;Limited activity within patient's tolerance;Repositioned  Home Living                                          Prior Functioning/Environment              Frequency  Min 2X/week        Progress Toward Goals  OT Goals(current goals can now be found in the care plan section)  Progress towards OT goals: Progressing toward goals  Acute Rehab OT Goals Patient Stated Goal: I want to go home with my husband OT Goal Formulation: With patient Time For Goal Achievement: 11/16/19 Potential to Achieve Goals: Good  Plan Discharge plan remains appropriate    Co-evaluation    PT/OT/SLP Co-Evaluation/Treatment: Yes Reason for Co-Treatment: Complexity of the patient's impairments (multi-system involvement);For patient/therapist safety;To address functional/ADL transfers PT goals addressed during session: Mobility/safety with mobility OT goals addressed during session: ADL's and self-care      AM-PAC OT "6 Clicks" Daily Activity     Outcome Measure   Help from another person eating meals?: A Little Help from another person taking care of personal grooming?: A Little Help from another person toileting, which includes using toliet, bedpan, or urinal?: Total Help from another  person bathing (including washing, rinsing, drying)?: A Lot Help from another person to put on and taking off regular upper body clothing?: A Little Help from another person to put on and taking off regular lower body clothing?: Total 6 Click Score: 13    End of Session    OT Visit Diagnosis: Other abnormalities of gait and mobility (R26.89);Muscle weakness (generalized) (M62.81);Pain Pain - Right/Left: Right Pain - part of body: Leg   Activity Tolerance Patient tolerated treatment well;Patient limited by pain   Patient Left in bed;with call bell/phone within reach   Nurse Communication          Time: [id number] OT Time Calculation (min): 26 min  Charges: OT General Charges $OT Visit: 1 Visit OT Treatments $Self Care/Home Management : 8-22 mins $Therapeutic Activity: 8-22 mins  Lanier Clam., COTA/L Acute Rehabilitation Services [phone]   Ihor Gully 11/08/2019, 2:03 PM

## 2019-11-08 NOTE — Progress Notes (Signed)
This nurse assumed care of patient at 2300. Noticed the order from the Dr to ask husband how long patient has had this foley catheter. Patient states she is unsure, and shares her husband passed away a few days ago. Will pass information along to day shift nurse.

## 2019-11-09 LAB — CBC
HCT: 29.6 % — ABNORMAL LOW (ref 36.0–46.0)
Hemoglobin: 9.1 g/dL — ABNORMAL LOW (ref 12.0–15.0)
MCH: 26.8 pg (ref 26.0–34.0)
MCHC: 30.7 g/dL (ref 30.0–36.0)
MCV: 87.1 fL (ref 80.0–100.0)
Platelets: 600 10*3/uL — ABNORMAL HIGH (ref 150–400)
RBC: 3.4 MIL/uL — ABNORMAL LOW (ref 3.87–5.11)
RDW: 16.8 % — ABNORMAL HIGH (ref 11.5–15.5)
WBC: 14.8 10*3/uL — ABNORMAL HIGH (ref 4.0–10.5)
nRBC: 0 % (ref 0.0–0.2)

## 2019-11-09 LAB — GLUCOSE, CAPILLARY
Glucose-Capillary: 135 mg/dL — ABNORMAL HIGH (ref 70–99)
Glucose-Capillary: 146 mg/dL — ABNORMAL HIGH (ref 70–99)
Glucose-Capillary: 146 mg/dL — ABNORMAL HIGH (ref 70–99)
Glucose-Capillary: 83 mg/dL (ref 70–99)

## 2019-11-09 LAB — HEPARIN LEVEL (UNFRACTIONATED): Heparin Unfractionated: 0.38 IU/mL (ref 0.30–0.70)

## 2019-11-09 MED ORDER — COLLAGENASE 250 UNIT/GM EX OINT
TOPICAL_OINTMENT | Freq: Every day | CUTANEOUS | Status: DC
  Administered 2019-11-09 – 2019-11-17 (×9): via TOPICAL
  Filled 2019-11-09: qty 30

## 2019-11-09 NOTE — Progress Notes (Signed)
  Progress Note    11/09/2019 10:32 AM 16 Days Post-Op  Subjective:  No overnight issues  Vitals:   11/09/19 0752 11/09/19 0815  BP:  (!) 133/56  Pulse:  74  Resp:  18  Temp:  98.5 F (36.9 C)  SpO2: 96% 96%    Physical Exam: aaox3 Groin wound dressing in place Right toe amp site cdi, sutures are out  CBC    Component Value Date/Time   WBC 14.8 (H) 11/09/2019 0229   RBC 3.40 (L) 11/09/2019 0229   HGB 9.1 (L) 11/09/2019 0229   HGB 11.2 01/28/2018 1035   HGB 12.0 09/29/2011 1016   HCT 29.6 (L) 11/09/2019 0229   HCT 34.4 01/28/2018 1035   HCT 35.3 09/29/2011 1016   PLT 600 (H) 11/09/2019 0229   PLT 384 (H) 01/28/2018 1035   MCV 87.1 11/09/2019 0229   MCV 88 01/28/2018 1035   MCV 88.0 09/29/2011 1016   MCH 26.8 11/09/2019 0229   MCHC 30.7 11/09/2019 0229   RDW 16.8 (H) 11/09/2019 0229   RDW 17.6 (H) 01/28/2018 1035   RDW 14.8 (H) 09/29/2011 1016   LYMPHSABS 1.6 10/14/2019 0529   LYMPHSABS 0.6 (L) 09/29/2011 1016   MONOABS 1.7 (H) 10/14/2019 0529   MONOABS 0.8 09/29/2011 1016   EOSABS 0.1 10/14/2019 0529   EOSABS 0.3 09/29/2011 1016   BASOSABS 0.1 10/14/2019 0529   BASOSABS 0.1 09/29/2011 1016    BMET    Component Value Date/Time   NA 134 (L) 10/30/2019 0328   NA 142 01/28/2018 1035   K 4.2 10/30/2019 0328   CL 101 10/30/2019 0328   CO2 22 10/30/2019 0328   GLUCOSE 131 (H) 10/30/2019 0328   BUN 19 10/30/2019 0328   BUN 19 01/28/2018 1035   BUN 14.7 07/18/2014 1142   CREATININE 1.24 (H) 10/30/2019 0328   CREATININE 1.0 07/18/2014 1142   CALCIUM 8.5 (L) 10/30/2019 0328   GFRNONAA 44 (L) 10/30/2019 0328   GFRAA 51 (L) 10/30/2019 0328    INR    Component Value Date/Time   INR 1.2 10/18/2019 0332     Intake/Output Summary (Last 24 hours) at 11/09/2019 1032 Last data filed at 11/09/2019 0900 Gross per 24 hour  Intake 488.07 ml  Output 1650 ml  Net -1161.93 ml     Assessment/plan:  68 y.o. female is s/p right cfea with vein patch and  sartorius flap. Plan for continued wound care with dakins. dispo pending improved leukocytosis and will need home wound care.    Mohd. Derflinger C. Donzetta Matters, MD [facility] Office: [phone] Pager: [phone]  11/09/2019 10:32 AM

## 2019-11-09 NOTE — Progress Notes (Signed)
Nutrition Follow up  DOCUMENTATION CODES:   Obesity unspecified  INTERVENTION:    Continue 30 ml Prostat BID, each supplement provides 100 kcals and 15 grams protein.   Continue Ensure MAX Protein po once daily, each supplement provides 150 kcal and 30 grams of protein  Continue Snacks BID  MVI daily   NUTRITION DIAGNOSIS:   Increased nutrient needs related to post-op healing as evidenced by estimated needs.  Ongoing  GOAL:   Patient will meet greater than or equal to 90% of their needs   Not met   MONITOR:   PO intake, Supplement acceptance, Weight trends, Labs, I & O's  REASON FOR ASSESSMENT:   Other (Comment)(LOS)    ASSESSMENT:   Patient with PMH significant for HTN, HLD, DM, severe COPD, cor pulmonale, and CAD. Presents this admission with R lower extremity critical limb ischemia.   10/1- R iliofemoral endart w/ vein angioplasty 10/13-R groin debridement, R great toe amputation 10/15- R groin debridement, wound VAC 10/17- R groin debridement, change wound VAC 10/20 R groin debridement, change wound VAC 10/23- R groin debridement, change wound VAC  10/26- R groin debridement, change wound VAC   Pt confused this am. Per RN, pt started eating better since last RD visit but intake slowly declined over the last couple of days. Meal completions charted as 10-100% for her last eight meals. Supplement intake remains inconsistent. If within goal would reconsider Cortrak placement to promote wound healing.   Admission weight: 90.7 kg  Current weight: 93.1 kg (last obtained 10/20)  I/O: -12,917 ml since 10/28 UOP: 1,650 ml x 24 hrs   Medications: colace, ferrous sulfate, folic acid, 40 mg lasix BID, SS novolog, lantus, MVI with iron, senokot Labs: CBG 101-236  Diet Order:   Diet Order            Diet Carb Modified Fluid consistency: Thin; Room service appropriate? Yes with Assist  Diet effective now              EDUCATION NEEDS:   Education needs  have been addressed  Skin:  Skin Assessment: Skin Integrity Issues: Skin Integrity Issues:: Wound VAC, Incisions, Stage II Stage II: buttocks Wound Vac: R groin Incisions: R foot/ R groin  Last BM:  11/10  Height:   Ht Readings from Last 1 Encounters:  10/18/19 5' 7.48" (1.714 m)    Weight:   Wt Readings from Last 1 Encounters:  10/18/19 93.1 kg    Ideal Body Weight:  63.6 kg  BMI:  Body mass index is 31.69 kg/m.  Estimated Nutritional Needs:   Kcal:  1700-1900 kcal  Protein:  85-90 grams  Fluid:  >/= 1.7 L/day     RD, LDN Clinical Nutrition Pager # - [phone]  

## 2019-11-09 NOTE — Progress Notes (Signed)
Patient appears more confused this AM,, vital signs stable, Dressing changed this AM to right groin. And right bottom with foam and aquacel BP 135/56 heart rate 95 . Will monitor patient. Kayden Hutmacher, Bettina Gavia rN

## 2019-11-09 NOTE — Consult Note (Addendum)
[facility] Nurse wound consult note Reason for Consult: Consult requested for right inner gluteal fold/buttock wound.  Location and appearance is consistent with a full thickness wound which is related to moisture, and is beginning to have a layer of slough to woundbed.  Pressure Injury POA: Location and appearance are not consistent with a pressure injury Measuremet: 2X2cm Wound bed: dark red-brown Drainage (amount, consistency, odor) no odor or drainage Periwound: red moist macerated skin surrounding with moisture associated skin damage; moisture associated skin damage to bilat buttocks Dressing procedure/placement/frequency: Begin Santyl to provide enzymatic debridement of nonviable tissue.  Pt is on a low airloss mattress to decrease pressure.  Please re-consult if further assistance is needed.  Thank-you,  Julien Girt MSN, [place], [place], [place], [place]

## 2019-11-09 NOTE — Progress Notes (Signed)
[place] for Heparin  Indication: history of DVT  Allergies  Allergen Reactions  . Ambien [Zolpidem Tartrate] Other (See Comments)    Per the pt, "it makes me go crazy"  . Sulfa Antibiotics Rash  . Ativan [Lorazepam] Other (See Comments)    Patient has the opposite effect when taking medication   . Elavil [Amitriptyline Hcl] Other (See Comments)    Gastritis  . Prozac [Fluoxetine Hcl] Anxiety and Other (See Comments)    irritability     Patient Measurements: Height: 5' 7.48" (171.4 cm) Weight: 205 lb 4 oz (93.1 kg) IBW/kg (Calculated) : 62.7 Heparin Dosing Weight: 83 kg  Vital Signs: Temp: 98.5 F (36.9 C) (11/11 0815) Temp Source: Oral (11/11 0815) BP: 133/56 (11/11 0815) Pulse Rate: 74 (11/11 0815)  Labs: Recent Labs    11/07/19 0258 11/08/19 0229 11/09/19 0229  HGB 9.4* 9.3* 9.1*  HCT 31.1* 30.6* 29.6*  PLT 608* 629* 600*  HEPARINUNFRC 0.58 0.80* 0.38    Estimated Creatinine Clearance: 50.6 mL/min (A) (by C-G formula based on SCr of 1.24 mg/dL (H)).  Assessment: 68 yo female with h/o DVT, Eliquis on hold. Pharmacy consulted to bridge with IV heparin.   Heparin level this morning is at goal (0.38), no bleeding issues noted. CBC has remained stable.   Goal of Therapy:  Heparin level 0.3-0.7 units/ml Monitor platelets by anticoagulation protocol: Yes   Plan:  Continue Heparin at 1100 units/hr Continue to follow daily CBC and heparin level   Erin Hearing PharmD., BCPS Clinical Pharmacist 11/09/2019 9:27 AM

## 2019-11-10 LAB — GLUCOSE, CAPILLARY
Glucose-Capillary: 94 mg/dL (ref 70–99)
Glucose-Capillary: 164 mg/dL — ABNORMAL HIGH (ref 70–99)
Glucose-Capillary: 106 mg/dL — ABNORMAL HIGH (ref 70–99)
Glucose-Capillary: 91 mg/dL (ref 70–99)

## 2019-11-10 LAB — CBC
HCT: 29.5 % — ABNORMAL LOW (ref 36.0–46.0)
Hemoglobin: 9.2 g/dL — ABNORMAL LOW (ref 12.0–15.0)
MCH: 26.7 pg (ref 26.0–34.0)
MCHC: 31.2 g/dL (ref 30.0–36.0)
MCV: 85.5 fL (ref 80.0–100.0)
Platelets: 563 10*3/uL — ABNORMAL HIGH (ref 150–400)
RBC: 3.45 MIL/uL — ABNORMAL LOW (ref 3.87–5.11)
RDW: 16.9 % — ABNORMAL HIGH (ref 11.5–15.5)
WBC: 13.9 10*3/uL — ABNORMAL HIGH (ref 4.0–10.5)
nRBC: 0 % (ref 0.0–0.2)

## 2019-11-10 LAB — HEPARIN LEVEL (UNFRACTIONATED): Heparin Unfractionated: 0.38 IU/mL (ref 0.30–0.70)

## 2019-11-10 NOTE — Progress Notes (Signed)
PT Cancellation Note  Patient Details Name: Lauren [name] MRN: [id number] DOB: Oct 10, 1951   Cancelled Treatment:    Reason Eval/Treat Not Completed: Patient declined, no reason specified patient refuses PT, becoming very tearful and stating "I just have too much going on today", extensive coaxing and education on importance of OOB provided but patient continues to tearfully refuse (notably, at one point stopping her tears and looking at the TV alertly, stating "he just ate a crayon!!" before returning to crying in bed). Unable to convince patient to participate today. Will follow and attempt on next date of service.    Windell Norfolk, DPT, CBIS  Supplemental Physical Therapist [facility]    Pager [phone] Acute Rehab Office [phone]

## 2019-11-10 NOTE — Progress Notes (Signed)
[place] for Heparin  Indication: history of DVT  Allergies  Allergen Reactions  . Ambien [Zolpidem Tartrate] Other (See Comments)    Per the pt, "it makes me go crazy"  . Sulfa Antibiotics Rash  . Ativan [Lorazepam] Other (See Comments)    Patient has the opposite effect when taking medication   . Elavil [Amitriptyline Hcl] Other (See Comments)    Gastritis  . Prozac [Fluoxetine Hcl] Anxiety and Other (See Comments)    irritability     Patient Measurements: Height: 5' 7.48" (171.4 cm) Weight: 205 lb 4 oz (93.1 kg) IBW/kg (Calculated) : 62.7 Heparin Dosing Weight: 83 kg  Vital Signs: Temp: 98.9 F (37.2 C) (11/12 0515) Temp Source: Oral (11/12 0515) BP: 110/94 (11/12 0515) Pulse Rate: 87 (11/12 0515)  Labs: Recent Labs    11/08/19 0229 11/09/19 0229 11/10/19 0309  HGB 9.3* 9.1* 9.2*  HCT 30.6* 29.6* 29.5*  PLT 629* 600* 563*  HEPARINUNFRC 0.80* 0.38 0.38    Estimated Creatinine Clearance: 50.6 mL/min (A) (by C-G formula based on SCr of 1.24 mg/dL (H)).  Assessment: 67 yo female with h/o DVT, Eliquis on hold. Pharmacy consulted to bridge with IV heparin.   Heparin level this morning is at goal and unchanged at (0.38), no bleeding issues noted. CBC has remained stable.   Goal of Therapy:  Heparin level 0.3-0.7 units/ml Monitor platelets by anticoagulation protocol: Yes   Plan:  Continue Heparin at 1100 units/hr Continue to follow daily CBC and heparin level   Erin Hearing PharmD., BCPS Clinical Pharmacist 11/10/2019 9:22 AM

## 2019-11-10 NOTE — Progress Notes (Addendum)
     Subjective  - No new complaints   Objective (!) 110/94 87 98.9 F (37.2 C) (Oral) 16 99%  Intake/Output Summary (Last 24 hours) at 11/10/2019 0917 Last data filed at 11/10/2019 0600 Gross per 24 hour  Intake 311.95 ml  Output 1900 ml  Net -1588.05 ml    Right GT amp site no drainage, foot warm Right groin with granulation and beefy red muscle bellies.  Lateral skin edges fat necrosis, no purulent drainage, or sign of infection.  Wet to dry with Dakins dressing changed.   Assessment/Planning: 68 y.o. female is s/p right Common Femoral endartectomy with vein patch and sartorius flap. Plan for continued wound care with dakins  WBC decreased now 13.9 form 14.8  Roxy Horseman 11/10/2019 9:17 AM --  Laboratory Lab Results: Recent Labs    11/09/19 0229 11/10/19 0309  WBC 14.8* 13.9*  HGB 9.1* 9.2*  HCT 29.6* 29.5*  PLT 600* 563*   BMET No results for input(s): NA, K, CL, CO2, GLUCOSE, BUN, CREATININE, CALCIUM in the last 72 hours.  COAG Lab Results  Component Value Date   INR 1.2 10/18/2019   INR 1.2 09/29/2019   INR 1.1 09/15/2019   No results found for: PTT  I have independently interviewed and examined patient and agree with PA assessment and plan above. Wbc improved. dispo pending wound improvement and wbc. I discussed with daughter today at [phone].  Neco Kling C. Donzetta Matters, MD [facility] [place] Office: [phone] Pager: [phone]

## 2019-11-11 LAB — GLUCOSE, CAPILLARY
Glucose-Capillary: 124 mg/dL — ABNORMAL HIGH (ref 70–99)
Glucose-Capillary: 227 mg/dL — ABNORMAL HIGH (ref 70–99)
Glucose-Capillary: 209 mg/dL — ABNORMAL HIGH (ref 70–99)
Glucose-Capillary: 125 mg/dL — ABNORMAL HIGH (ref 70–99)

## 2019-11-11 LAB — CBC
HCT: 30.8 % — ABNORMAL LOW (ref 36.0–46.0)
Hemoglobin: 9.6 g/dL — ABNORMAL LOW (ref 12.0–15.0)
MCH: 26.8 pg (ref 26.0–34.0)
MCHC: 31.2 g/dL (ref 30.0–36.0)
MCV: 86 fL (ref 80.0–100.0)
Platelets: 522 10*3/uL — ABNORMAL HIGH (ref 150–400)
RBC: 3.58 MIL/uL — ABNORMAL LOW (ref 3.87–5.11)
RDW: 16.9 % — ABNORMAL HIGH (ref 11.5–15.5)
WBC: 16.4 10*3/uL — ABNORMAL HIGH (ref 4.0–10.5)
nRBC: 0 % (ref 0.0–0.2)

## 2019-11-11 LAB — HEPARIN LEVEL (UNFRACTIONATED): Heparin Unfractionated: 0.37 IU/mL (ref 0.30–0.70)

## 2019-11-11 NOTE — Progress Notes (Signed)
Lauren [name] for Heparin  Indication: history of DVT  Allergies  Allergen Reactions  . Ambien [Zolpidem Tartrate] Other (See Comments)    Per the pt, "it makes me go crazy"  . Sulfa Antibiotics Rash  . Ativan [Lorazepam] Other (See Comments)    Patient has the opposite effect when taking medication   . Elavil [Amitriptyline Hcl] Other (See Comments)    Gastritis  . Prozac [Fluoxetine Hcl] Anxiety and Other (See Comments)    irritability     Patient Measurements: Height: 5' 7.48" (171.4 cm) Weight: 205 lb 4 oz (93.1 kg) IBW/kg (Calculated) : 62.7 Heparin Dosing Weight: 83 kg  Vital Signs: Temp: 98.5 F (36.9 C) (11/12 2210) Temp Source: Oral (11/12 2210) BP: 104/61 (11/12 2210) Pulse Rate: 87 (11/12 2210)  Labs: Recent Labs    11/09/19 0229 11/10/19 0309 11/11/19 0311  HGB 9.1* 9.2* 9.6*  HCT 29.6* 29.5* 30.8*  PLT 600* 563* 522*  HEPARINUNFRC 0.38 0.38 0.37    Estimated Creatinine Clearance: 50.6 mL/min (A) (by C-G formula based on SCr of 1.24 mg/dL (H)).  Assessment: 68 yo female with h/o DVT, Eliquis on hold. Pharmacy consulted to bridge with IV heparin.   Heparin level this morning is at goal at (0.38), no bleeding issues noted. CBC has remained stable.   Goal of Therapy:  Heparin level 0.3-0.7 units/ml Monitor platelets by anticoagulation protocol: Yes   Plan:  Continue Heparin at 1100 units/hr Continue to follow daily CBC and heparin level   Erin Hearing PharmD., BCPS Clinical Pharmacist 11/11/2019 9:24 AM

## 2019-11-11 NOTE — Progress Notes (Addendum)
[facility] [place]  Subjective  - No new complaints    Objective 104/61 87 98.5 F (36.9 C) (Oral) 15 95%  Intake/Output Summary (Last 24 hours) at 11/11/2019 0736 Last data filed at 11/10/2019 1900 Gross per 24 hour  Intake 385.28 ml  Output 800 ml  Net -414.72 ml    Right groin muscle bed heaing well with good granulation tissue.  Lateral skin edges with fat necrosis, no purulence.   Dakins wet to dry dressing changed right groin Right GT amp site intact without drainage Lungs non labored breathing  Assessment/Planning: 68 y.o.femaleis s/p right Common Femoral endartectomy with vein patch and sartorius flap. Plan for continued wound care with dakins  Leukocytosis of unknown cause now14.8  form 16.4 Chest x ray unremarkable, UA clean.   Roxy Horseman 11/11/2019 7:36 AM --  Laboratory Lab Results: Recent Labs    11/10/19 0309 11/11/19 0311  WBC 13.9* 16.4*  HGB 9.2* 9.6*  HCT 29.5* 30.8*  PLT 563* 522*   BMET No results for input(s): NA, K, CL, CO2, GLUCOSE, BUN, CREATININE, CALCIUM in the last 72 hours.  COAG Lab Results  Component Value Date   INR 1.2 10/18/2019   INR 1.2 09/29/2019   INR 1.1 09/15/2019   No results found for: PTT   I have interviewed and examined patient with PA and agree with assessment and plan above.  Right groin wound does not appear to be a source of leukocytosis with only lateral fibrinous exudate that is stable.  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  We will attempt to change scenery on 4 E. if bed availability allows.  Disposition pending elevated leukocytosis and wound care.  Loralie Malta C. Donzetta Matters, MD [facility] [place] Office: [phone] Pager: [phone]

## 2019-11-11 NOTE — Progress Notes (Signed)
Physical Therapy Treatment Patient Details Name: Lauren [name] MRN: [id number] DOB: 1951/01/03 Today's Date: 11/11/2019    History of Present Illness Pt is a 68 y.o. female admitted 09/29/19 with worsening pain R foot and great toe. S/p bilateral SFA endarterectomies 10/1. Now s/p RCFEA now with wound debridement and great toe amp 10/13. S/p R groin I&D and sartoirus muscle flap with wound vac placed 10/15. To OR for additional groin I&D on 10/20. s/p multiple I&Ds of right groin 10/23, 10/26. PMH includes HTN, HLD, COPD, DM, CAD.    PT Comments    Patient not progressing towards PT goals. Tolerated total A with rolling in bed and total A to transfer to chair using maxi move. Pt had not been OOB in weeks. Limited due to pain in BLEs and buttocks with any movement. Pt noted to have hallucinations- seeing cats and her daughter in the room who are not present. Pt seems to be getting more confused likely due to delirium and prolonged hospital stay. Pain is biggest limiting factor for mobility. Continues to be appropriate for SNF. Will follow.   Follow Up Recommendations  SNF     Equipment Recommendations  Other (comment)(defer)    Recommendations for Other Services       Precautions / Restrictions Precautions Precautions: Fall Precaution Comments: Limited knee flexion bilaterally Restrictions Weight Bearing Restrictions: Yes RLE Weight Bearing: Non weight bearing LLE Weight Bearing: Non weight bearing Other Position/Activity Restrictions: WB through R heel, L orthotic and shoe for OOB    Mobility  Bed Mobility Overal bed mobility: Needs Assistance Bed Mobility: Rolling Rolling: Total assist;+2 for physical assistance         General bed mobility comments: Rolling to right/left to place lift pad, total A of 2 with minimal effort to assist.  Transfers Overall transfer level: Needs assistance Equipment used: Ambulation equipment used             General transfer  comment: Total A using maxi move to transfer pt to chair.  Ambulation/Gait             General Gait Details: Unable   Stairs             Wheelchair Mobility    Modified Rankin (Stroke Patients Only)       Balance                                            Cognition Arousal/Alertness: Awake/alert Behavior During Therapy: WFL for tasks assessed/performed Overall Cognitive Status: Impaired/Different from baseline Area of Impairment: Orientation;Attention;Memory;Following commands;Safety/judgement;Awareness;Problem solving                 Orientation Level: Disoriented to;Time;Situation;Place Current Attention Level: Sustained Memory: Decreased short-term memory Following Commands: Follows one step commands with increased time Safety/Judgement: Decreased awareness of deficits;Decreased awareness of safety Awareness: Intellectual Problem Solving: Decreased initiation;Requires verbal cues;Requires tactile cues General Comments: Pt thinks she is at home; pt hallucinating reports seeing a cat crawl across the floor and talking to her daughter who is not in the room. Also reports seeing her husband who died recently and her dog Abby. Easily distraced. Poor attention. going back in forth between laughing and crying during session.      Exercises      General Comments General comments (skin integrity, edema, etc.): VSS throughout      Pertinent Vitals/Pain Pain  Assessment: Faces Faces Pain Scale: Hurts whole lot Pain Location: BLEs, buttocks Pain Descriptors / Indicators: Grimacing;Guarding;Moaning;Crying Pain Intervention(s): Monitored during session;Repositioned;Utilized relaxation techniques    Home Living                      Prior Function            PT Goals (current goals can now be found in the care plan section) Progress towards PT goals: Not progressing toward goals - comment(pain limiting)    Frequency    Min  2X/week      PT Plan Current plan remains appropriate    Co-evaluation              AM-PAC PT "6 Clicks" Mobility   Outcome Measure  Help needed turning from your back to your side while in a flat bed without using bedrails?: Total Help needed moving from lying on your back to sitting on the side of a flat bed without using bedrails?: Total Help needed moving to and from a bed to a chair (including a wheelchair)?: Total Help needed standing up from a chair using your arms (e.g., wheelchair or bedside chair)?: Total Help needed to walk in hospital room?: Total Help needed climbing 3-5 steps with a railing? : Total 6 Click Score: 6    End of Session   Activity Tolerance: Patient limited by pain Patient left: in chair;with call bell/phone within reach;with chair alarm set Nurse Communication: Mobility status;Need for lift equipment PT Visit Diagnosis: Muscle weakness (generalized) (M62.81);Unsteadiness on feet (R26.81);Pain;Other abnormalities of gait and mobility (R26.89) Pain - Right/Left: (bil) Pain - part of body: Leg(buttocks)     Time: 1030-1050 PT Time Calculation (min) (ACUTE ONLY): 20 min  Charges:  $Therapeutic Activity: 8-22 mins                     Marisa Severin, PT, DPT Acute Rehabilitation Services Pager [phone] Office [place] 11/11/2019, 12:15 PM

## 2019-11-11 NOTE — Progress Notes (Signed)
1515 Bedside shift report. Pt resting comfortably in bed, NAD, WCTM.   1600 Pt assessed, pt A&Ox2 to name and place, disoriented to time and situation. Pt reoriented. Pt having a conversation with self, asked pt who she was speaking to and stated her daughter, reoriented her that no one was in the room except RN. Pt continues having conversation with self. Updated pt with POC, pt grimaces to pain with movement or repositioning, especially legs. Fall precautions in place, [facility].   1620 Pt premedicated pt with pain meds, wound care to right groin performed, santyl applied to coccyx wound and foam reapplied. Pt repositioned, WCTM.   1800 Pt sitting up eating dinner, medicated per MAR. Pt still having conversations with self. Reoriented. WCTM.   1845 Pt sleeping, NAD, fall precautions in place, WCTM.

## 2019-11-11 NOTE — Progress Notes (Signed)
Report received from offcoming RN- pt turned and checked pads. Currently sleeping- will continue to monitor

## 2019-11-12 LAB — CBC
HCT: 30 % — ABNORMAL LOW (ref 36.0–46.0)
Hemoglobin: 9.4 g/dL — ABNORMAL LOW (ref 12.0–15.0)
MCH: 26.6 pg (ref 26.0–34.0)
MCHC: 31.3 g/dL (ref 30.0–36.0)
MCV: 85 fL (ref 80.0–100.0)
Platelets: 500 10*3/uL — ABNORMAL HIGH (ref 150–400)
RBC: 3.53 MIL/uL — ABNORMAL LOW (ref 3.87–5.11)
RDW: 17.2 % — ABNORMAL HIGH (ref 11.5–15.5)
WBC: 13.9 10*3/uL — ABNORMAL HIGH (ref 4.0–10.5)
nRBC: 0 % (ref 0.0–0.2)

## 2019-11-12 LAB — GLUCOSE, CAPILLARY
Glucose-Capillary: 165 mg/dL — ABNORMAL HIGH (ref 70–99)
Glucose-Capillary: 222 mg/dL — ABNORMAL HIGH (ref 70–99)
Glucose-Capillary: 211 mg/dL — ABNORMAL HIGH (ref 70–99)
Glucose-Capillary: 147 mg/dL — ABNORMAL HIGH (ref 70–99)

## 2019-11-12 LAB — HEPARIN LEVEL (UNFRACTIONATED)
Heparin Unfractionated: 0.16 IU/mL — ABNORMAL LOW (ref 0.30–0.70)
Heparin Unfractionated: 0.39 IU/mL (ref 0.30–0.70)

## 2019-11-12 NOTE — Progress Notes (Addendum)
  Progress Note    11/12/2019 8:16 AM 19 Days Post-Op  Subjective:  Somnolent   Vitals:   11/11/19 2000 11/12/19 0527  BP: (!) 116/58 117/84  Pulse:  87  Resp: 17 16  Temp:  98.3 F (36.8 C)  SpO2:  98%   Physical Exam: Lungs:  Non labored Incisions:  Dressing in place R groin Extremities:  R toe amp site stable, some devitalized tissue but no sign of infection Neurologic: somnolent  CBC    Component Value Date/Time   WBC 13.9 (H) 11/12/2019 0308   RBC 3.53 (L) 11/12/2019 0308   HGB 9.4 (L) 11/12/2019 0308   HGB 11.2 01/28/2018 1035   HGB 12.0 09/29/2011 1016   HCT 30.0 (L) 11/12/2019 0308   HCT 34.4 01/28/2018 1035   HCT 35.3 09/29/2011 1016   PLT 500 (H) 11/12/2019 0308   PLT 384 (H) 01/28/2018 1035   MCV 85.0 11/12/2019 0308   MCV 88 01/28/2018 1035   MCV 88.0 09/29/2011 1016   MCH 26.6 11/12/2019 0308   MCHC 31.3 11/12/2019 0308   RDW 17.2 (H) 11/12/2019 0308   RDW 17.6 (H) 01/28/2018 1035   RDW 14.8 (H) 09/29/2011 1016   LYMPHSABS 1.6 10/14/2019 0529   LYMPHSABS 0.6 (L) 09/29/2011 1016   MONOABS 1.7 (H) 10/14/2019 0529   MONOABS 0.8 09/29/2011 1016   EOSABS 0.1 10/14/2019 0529   EOSABS 0.3 09/29/2011 1016   BASOSABS 0.1 10/14/2019 0529   BASOSABS 0.1 09/29/2011 1016    BMET    Component Value Date/Time   NA 134 (L) 10/30/2019 0328   NA 142 01/28/2018 1035   K 4.2 10/30/2019 0328   CL 101 10/30/2019 0328   CO2 22 10/30/2019 0328   GLUCOSE 131 (H) 10/30/2019 0328   BUN 19 10/30/2019 0328   BUN 19 01/28/2018 1035   BUN 14.7 07/18/2014 1142   CREATININE 1.24 (H) 10/30/2019 0328   CREATININE 1.0 07/18/2014 1142   CALCIUM 8.5 (L) 10/30/2019 0328   GFRNONAA 44 (L) 10/30/2019 0328   GFRAA 51 (L) 10/30/2019 0328    INR    Component Value Date/Time   INR 1.2 10/18/2019 0332     Intake/Output Summary (Last 24 hours) at 11/12/2019 0816 Last data filed at 11/12/2019 0400 Gross per 24 hour  Intake 470.5 ml  Output 925 ml  Net -454.5 ml     Assessment/Plan:  68 y.o. female is s/p rightCommon Femoral endartectomywith vein patch and sartorius flap 19 Days Post-Op   Dressing changed this morning; continue dressing changes with Dakins Leukocytosis: improving, CXR and UA negative, unclear etiology Continue to monitor R toe amp site  Dagoberto Ligas, PA-C [facility] [phone] 11/12/2019 8:16 AM  I have seen and evaluated the patient. I agree with the PA note as documented above.  Continue wet-to-dry Dakin's dressing change to right groin.  Tolerated this am with beside nursing change.  Leukocytosis improving today down from 16 --> 13.  Right toe amp looks stable with no active infection.  Marty Heck, MD [facility] [place] Office: [phone] Pager: [phone]

## 2019-11-12 NOTE — Plan of Care (Signed)
  Problem: Clinical Measurements: Goal: Ability to maintain clinical measurements within normal limits will improve Outcome: Progressing Goal: Will remain free from infection Outcome: Progressing Goal: Diagnostic test results will improve Outcome: Progressing Goal: Respiratory complications will improve Outcome: Progressing Goal: Cardiovascular complication will be avoided Outcome: Progressing   Problem: Activity: Goal: Risk for activity intolerance will decrease Outcome: Progressing   Problem: Coping: Goal: Level of anxiety will decrease Outcome: Progressing   Problem: Pain Managment: Goal: General experience of comfort will improve Outcome: Progressing   Problem: Skin Integrity: Goal: Risk for impaired skin integrity will decrease Outcome: Progressing

## 2019-11-12 NOTE — Progress Notes (Signed)
[place] for Heparin  Indication: history of DVT  Allergies  Allergen Reactions  . Ambien [Zolpidem Tartrate] Other (See Comments)    Per the pt, "it makes me go crazy"  . Sulfa Antibiotics Rash  . Ativan [Lorazepam] Other (See Comments)    Patient has the opposite effect when taking medication   . Elavil [Amitriptyline Hcl] Other (See Comments)    Gastritis  . Prozac [Fluoxetine Hcl] Anxiety and Other (See Comments)    irritability     Patient Measurements: Height: 5' 7.48" (171.4 cm) Weight: 205 lb 4 oz (93.1 kg) IBW/kg (Calculated) : 62.7 Heparin Dosing Weight: 83 kg  Vital Signs: Temp: 98.7 F (37.1 C) (11/13 1959) Temp Source: Oral (11/13 1959) BP: 116/58 (11/13 2000) Pulse Rate: 81 (11/13 1959)  Labs: Recent Labs    11/10/19 0309 11/11/19 0311 11/12/19 0308  HGB 9.2* 9.6* 9.4*  HCT 29.5* 30.8* 30.0*  PLT 563* 522* 500*  HEPARINUNFRC 0.38 0.37 0.16*    Estimated Creatinine Clearance: 50.6 mL/min (A) (by C-G formula based on SCr of 1.24 mg/dL (H)).  Assessment: 67 yo female with h/o DVT, Eliquis on hold. Pharmacy consulted to bridge with IV heparin.   Heparin level below goal 0.16, no bleeding issues noted. CBC has remained stable.   Goal of Therapy:  Heparin level 0.3-0.7 units/ml Monitor platelets by anticoagulation protocol: Yes   Plan:  Continue Heparin at 1250 units/hr Recheck Heparin level in 6 -8 hours Continue to follow daily CBC and heparin level   Alanda Slim, PharmD, [facility] Clinical Pharmacist Please see AMION for all Pharmacists' Contact Phone Numbers 11/12/2019, 4:22 AM

## 2019-11-12 NOTE — Progress Notes (Signed)
[place] for Heparin  Indication: history of DVT  Allergies  Allergen Reactions  . Ambien [Zolpidem Tartrate] Other (See Comments)    Per the pt, "it makes me go crazy"  . Sulfa Antibiotics Rash  . Ativan [Lorazepam] Other (See Comments)    Patient has the opposite effect when taking medication   . Elavil [Amitriptyline Hcl] Other (See Comments)    Gastritis  . Prozac [Fluoxetine Hcl] Anxiety and Other (See Comments)    irritability     Patient Measurements: Height: 5' 7.48" (171.4 cm) Weight: 205 lb 4 oz (93.1 kg) IBW/kg (Calculated) : 62.7 Heparin Dosing Weight: 83 kg  Vital Signs: Temp: 98.5 F (36.9 C) (11/14 1153) Temp Source: Oral (11/14 1153) BP: 111/63 (11/14 1153) Pulse Rate: 97 (11/14 1153)  Labs: Recent Labs    11/10/19 0309 11/11/19 0311 11/12/19 0308 11/12/19 1133  HGB 9.2* 9.6* 9.4*  --   HCT 29.5* 30.8* 30.0*  --   PLT 563* 522* 500*  --   HEPARINUNFRC 0.38 0.37 0.16* 0.39    Estimated Creatinine Clearance: 50.6 mL/min (A) (by C-G formula based on SCr of 1.24 mg/dL (H)).  Assessment: 68 yo female with h/o DVT, PTA Eliquis on hold. Pharmacy consulted to bridge with IV heparin.   Repeat heparin level this afternoon is therapeutic at 0.39 s/p rate increase to 1250 units/hr. H&H remains stable at 9.4/30.0, plts elevated but dec from prior at 500.   Goal of Therapy:  Heparin level 0.3-0.7 units/ml Monitor platelets by anticoagulation protocol: Yes   Plan:  Continue Heparin at 1250 units/hr Continue to follow daily CBC and heparin level  Monitor signs of bleeding    Thank you,   Eddie Candle, PharmD PGY-1 Pharmacy Resident   Please check amion for clinical pharmacist contact number

## 2019-11-13 LAB — URINALYSIS, ROUTINE W REFLEX MICROSCOPIC
Bilirubin Urine: NEGATIVE
Glucose, UA: NEGATIVE mg/dL
Ketones, ur: NEGATIVE mg/dL
Nitrite: POSITIVE — AB
Protein, ur: 30 mg/dL — AB
RBC / HPF: >50 RBC/hpf — ABNORMAL HIGH (ref 0–5)
Specific Gravity, Urine: 1.01 (ref 1.005–1.030)
WBC, UA: >50 WBC/hpf — ABNORMAL HIGH (ref 0–5)
pH: 6 (ref 5.0–8.0)

## 2019-11-13 LAB — GLUCOSE, CAPILLARY
Glucose-Capillary: 158 mg/dL — ABNORMAL HIGH (ref 70–99)
Glucose-Capillary: 176 mg/dL — ABNORMAL HIGH (ref 70–99)
Glucose-Capillary: 159 mg/dL — ABNORMAL HIGH (ref 70–99)
Glucose-Capillary: 127 mg/dL — ABNORMAL HIGH (ref 70–99)

## 2019-11-13 LAB — HEPARIN LEVEL (UNFRACTIONATED): Heparin Unfractionated: 0.4 IU/mL (ref 0.30–0.70)

## 2019-11-13 MED ORDER — CIPROFLOXACIN HCL 500 MG PO TABS
250.0000 mg | ORAL_TABLET | Freq: Two times a day (BID) | ORAL | Status: AC
  Administered 2019-11-13 – 2019-11-16 (×6): 250 mg via ORAL
  Filled 2019-11-13 (×6): qty 1

## 2019-11-13 NOTE — Progress Notes (Signed)
[place]ren [name] for Heparin  Indication: history of DVT  Allergies  Allergen Reactions  . Ambien [Zolpidem Tartrate] Other (See Comments)    Per the pt, "it makes me go crazy"  . Sulfa Antibiotics Rash  . Ativan [Lorazepam] Other (See Comments)    Patient has the opposite effect when taking medication   . Elavil [Amitriptyline Hcl] Other (See Comments)    Gastritis  . Prozac [Fluoxetine Hcl] Anxiety and Other (See Comments)    irritability     Patient Measurements: Height: 5' 7.48" (171.4 cm) Weight: 205 lb 4 oz (93.1 kg) IBW/kg (Calculated) : 62.7 Heparin Dosing Weight: 83 kg  Vital Signs: Temp: 98.7 F (37.1 C) (11/15 0418) Temp Source: Oral (11/15 0418) BP: 82/52 (11/15 0418) Pulse Rate: 77 (11/15 0418)  Labs: Recent Labs    11/11/19 0311 11/12/19 0308 11/12/19 1133 11/13/19 0639  HGB 9.6* 9.4*  --   --   HCT 30.8* 30.0*  --   --   PLT 522* 500*  --   --   HEPARINUNFRC 0.37 0.16* 0.39 0.40    Estimated Creatinine Clearance: 50.6 mL/min (A) (by C-G formula based on SCr of 1.24 mg/dL (H)).  Assessment: 68 yo female with h/o DVT, PTA Eliquis on hold. Pharmacy consulted to bridge with IV heparin.   Heparin level this morning is therapeutic at 0.4 on 1250 units/hr. There is no CBC drawn today yet, but her levels have been stable with an H&H around 9/30.  Goal of Therapy:  Heparin level 0.3-0.7 units/ml Monitor platelets by anticoagulation protocol: Yes   Plan:  Continue Heparin at 1250 units/hr Continue to follow daily CBC and heparin level  Monitor signs of bleeding    Thank you,   Eddie Candle, PharmD PGY-1 Pharmacy Resident   Please check amion for clinical pharmacist contact number

## 2019-11-13 NOTE — Progress Notes (Addendum)
  Progress Note    11/13/2019 8:38 AM 20 Days Post-Op  Subjective:  No new complaints   Vitals:   11/12/19 1950 11/13/19 0418  BP: (!) 91/45 (!) 82/52  Pulse:  77  Resp: 18 14  Temp: 98.6 F (37 C) 98.7 F (37.1 C)  SpO2: 96% 94%   Physical Exam: Lungs:  Non labored Incisions:  R groin healthy muscle flap but devitalized skin edges Extremities:  R ATA by doppler; necrotic tissue at toe amp site but no drainage or overt infection Neurologic: A&O      CBC    Component Value Date/Time   WBC 13.9 (H) 11/12/2019 0308   RBC 3.53 (L) 11/12/2019 0308   HGB 9.4 (L) 11/12/2019 0308   HGB 11.2 01/28/2018 1035   HGB 12.0 09/29/2011 1016   HCT 30.0 (L) 11/12/2019 0308   HCT 34.4 01/28/2018 1035   HCT 35.3 09/29/2011 1016   PLT 500 (H) 11/12/2019 0308   PLT 384 (H) 01/28/2018 1035   MCV 85.0 11/12/2019 0308   MCV 88 01/28/2018 1035   MCV 88.0 09/29/2011 1016   MCH 26.6 11/12/2019 0308   MCHC 31.3 11/12/2019 0308   RDW 17.2 (H) 11/12/2019 0308   RDW 17.6 (H) 01/28/2018 1035   RDW 14.8 (H) 09/29/2011 1016   LYMPHSABS 1.6 10/14/2019 0529   LYMPHSABS 0.6 (L) 09/29/2011 1016   MONOABS 1.7 (H) 10/14/2019 0529   MONOABS 0.8 09/29/2011 1016   EOSABS 0.1 10/14/2019 0529   EOSABS 0.3 09/29/2011 1016   BASOSABS 0.1 10/14/2019 0529   BASOSABS 0.1 09/29/2011 1016    BMET    Component Value Date/Time   NA 134 (L) 10/30/2019 0328   NA 142 01/28/2018 1035   K 4.2 10/30/2019 0328   CL 101 10/30/2019 0328   CO2 22 10/30/2019 0328   GLUCOSE 131 (H) 10/30/2019 0328   BUN 19 10/30/2019 0328   BUN 19 01/28/2018 1035   BUN 14.7 07/18/2014 1142   CREATININE 1.24 (H) 10/30/2019 0328   CREATININE 1.0 07/18/2014 1142   CALCIUM 8.5 (L) 10/30/2019 0328   GFRNONAA 44 (L) 10/30/2019 0328   GFRAA 51 (L) 10/30/2019 0328    INR    Component Value Date/Time   INR 1.2 10/18/2019 0332     Intake/Output Summary (Last 24 hours) at 11/13/2019 [id number] Last data filed at 11/12/2019 2017  Gross per 24 hour  Intake 339.9 ml  Output 1200 ml  Net -860.1 ml     Assessment/Plan:  68 y.o. female is s/p rightCommon Femoral endartectomywith vein patch and sartorius flap 20 Days Post-Op   Dressing changed; wound pictured above; healthy muscle flap, skin edges fibrinous devitalized tissue Continue current wound care with Dakins Encouraged PO intake Check UA; monitor leukocytosis Continue to monitor toe amp site; ATA by doppler and foot warm to touch    Dagoberto Ligas, PA-C [facility] [phone] 11/13/2019 8:38 AM  I have seen and evaluated the patient. I agree with the PA note as documented above.   Marty Heck, MD [facility] [place] Office: [phone] Pager: [phone]

## 2019-11-13 NOTE — Plan of Care (Signed)
  Problem: Clinical Measurements: Goal: Ability to maintain clinical measurements within normal limits will improve Outcome: Progressing Goal: Will remain free from infection Outcome: Progressing Goal: Diagnostic test results will improve Outcome: Progressing Goal: Respiratory complications will improve Outcome: Progressing Goal: Cardiovascular complication will be avoided Outcome: Progressing   Problem: Activity: Goal: Risk for activity intolerance will decrease Outcome: Progressing   Problem: Coping: Goal: Level of anxiety will decrease Outcome: Progressing   Problem: Pain Managment: Goal: General experience of comfort will improve Outcome: Progressing   Problem: Skin Integrity: Goal: Risk for impaired skin integrity will decrease Outcome: Progressing

## 2019-11-13 NOTE — Progress Notes (Signed)
Pharmacy Antibiotic Note  Lauren [name] is a 68 y.o. female admitted on 09/29/2019 with need for multiple vascular procedures. Pt previously on ABX for R toe gangrene and wound necrosis, off since 11/6. Pharmacy has been consulted for ciprofloxacin dosing with concern for new UTI.  Plan: Ciprofloxacin 250mg  PO q12h x3 days   Height: 5' 7.48" (171.4 cm) Weight: 205 lb 4 oz (93.1 kg) IBW/kg (Calculated) : 62.7  Temp (24hrs), Avg:98.5 F (36.9 C), Min:98.2 F (36.8 C), Max:98.7 F (37.1 C)  Recent Labs  Lab 11/08/19 0229 11/09/19 0229 11/10/19 0309 11/11/19 0311 11/12/19 0308  WBC 14.9* 14.8* 13.9* 16.4* 13.9*    Estimated Creatinine Clearance: 50.6 mL/min (A) (by C-G formula based on SCr of 1.24 mg/dL (H)).    Allergies  Allergen Reactions  . Ambien [Zolpidem Tartrate] Other (See Comments)    Per the pt, "it makes me go crazy"  . Sulfa Antibiotics Rash  . Ativan [Lorazepam] Other (See Comments)    Patient has the opposite effect when taking medication   . Elavil [Amitriptyline Hcl] Other (See Comments)    Gastritis  . Prozac [Fluoxetine Hcl] Anxiety and Other (See Comments)    irritability     Antimicrobials this admission: Chronic Macrobid 10/1>>10/11 Ciprofloxacin 10/9>>10/11; 10/28>>11/6; 11/15 >> Vancomycin 10/11 >>10/16 Zosyn 10/11 >>10/16   Thank you for allowing pharmacy to be a part of this patient's care.  Arrie Senate, PharmD, BCPS Clinical Pharmacist [phone] Please check AMION for all [place] numbers 11/13/2019

## 2019-11-14 LAB — HEPARIN LEVEL (UNFRACTIONATED): Heparin Unfractionated: 0.54 IU/mL (ref 0.30–0.70)

## 2019-11-14 LAB — CBC
HCT: 32.3 % — ABNORMAL LOW (ref 36.0–46.0)
Hemoglobin: 10.2 g/dL — ABNORMAL LOW (ref 12.0–15.0)
MCH: 26.7 pg (ref 26.0–34.0)
MCHC: 31.6 g/dL (ref 30.0–36.0)
MCV: 84.6 fL (ref 80.0–100.0)
Platelets: 454 10*3/uL — ABNORMAL HIGH (ref 150–400)
RBC: 3.82 MIL/uL — ABNORMAL LOW (ref 3.87–5.11)
RDW: 16.7 % — ABNORMAL HIGH (ref 11.5–15.5)
WBC: 12.7 10*3/uL — ABNORMAL HIGH (ref 4.0–10.5)
nRBC: 0 % (ref 0.0–0.2)

## 2019-11-14 LAB — GLUCOSE, CAPILLARY
Glucose-Capillary: 103 mg/dL — ABNORMAL HIGH (ref 70–99)
Glucose-Capillary: 204 mg/dL — ABNORMAL HIGH (ref 70–99)
Glucose-Capillary: 167 mg/dL — ABNORMAL HIGH (ref 70–99)
Glucose-Capillary: 90 mg/dL (ref 70–99)

## 2019-11-14 NOTE — Care Management Important Message (Signed)
Important Message  Patient Details  Name: Lauren [name] MRN: [id number] Date of Birth: 1951/04/05   Medicare Important Message Given:  Yes     [name] 11/14/2019, 2:19 PM

## 2019-11-14 NOTE — Progress Notes (Signed)
Occupational therapy Note  Pt seen as co-treat with PT. Pt requires Max encouragement to participate with mobility. Pt apparently confused and delirious. Appears to be hallucinating at times. Inappropriate at times during session, then able to attend to task and participate in conversation. Able to progress to EOB with Total A x 2. 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 Apparently painful with mobility. Discussed possible use of tilt bed to progress pt. Recommend palliative care consult to address delirium, pain control and goals of care. Goals downgraded.    11/14/19 1624  OT Visit Information  Last OT Received On 11/14/19  Assistance Needed +2  PT/OT/SLP Co-Evaluation/Treatment Yes  Reason for Co-Treatment Complexity of the patient's impairments (multi-system involvement);For patient/therapist safety;To address functional/ADL transfers  OT goals addressed during session ADL's and self-care;Strengthening/ROM  History of Present Illness Pt is a 68 y.o. female admitted 09/29/19 with worsening pain R foot and great toe. S/p bilateral SFA endarterectomies 10/1. Now s/p RCFEA now with wound debridement and great toe amp 10/13. S/p R groin I&D and sartoirus muscle flap with wound vac placed 10/15. To OR for additional groin I&D on 10/20. s/p multiple I&Ds of right groin 10/23, 10/26. PMH includes HTN, HLD, COPD, DM, CAD.  Precautions  Precautions Fall  Precaution Comments Limited knee flexion bilaterally  Pain Assessment  Pain Assessment Faces  Faces Pain Scale 8  Pain Location everywhere with movement  Pain Descriptors / Indicators Grimacing;Guarding;Moaning  Pain Intervention(s) Limited activity within patient's tolerance  Cognition  Arousal/Alertness Lethargic  Behavior During Therapy  (labile)  Overall Cognitive Status Impaired/Different from baseline  Area of Impairment Orientation;Attention;Memory;Following  commands;Awareness;Safety/judgement;Problem solving  Orientation Level Disoriented to;Place;Time;Situation  Current Attention Level Sustained  Memory Decreased short-term memory  Following Commands Follows one step commands with increased time;Follows one step commands inconsistently  Safety/Judgement Decreased awareness of deficits;Decreased awareness of safety  Awareness Intellectual  Problem Solving Decreased initiation;Requires verbal cues;Requires tactile cues;Slow processing  General Comments Pt very distracted and tangential in speech. Poor attention, needing constant cues to stay attended to task. Walked in and pt had orange jello all over self and bed, "where did this come from?"  Upper Extremity Assessment  Upper Extremity Assessment Generalized weakness  Lower Extremity Assessment  Lower Extremity Assessment Defer to PT evaluation  ADL  Eating/Feeding Set up;Supervision/ safety  Grooming Minimal assistance;Sitting;Cueing for sequencing;Oral care  Grooming Details (indicate cue type and reason) difficulty sustaining attention to task  Upper Body Bathing Minimal assistance;Bed level  Lower Body Bathing Maximal assistance;Bed level  Upper Body Dressing  Minimal assistance;Bed level  Functional mobility during ADLs Total assistance;+2 for physical assistance;+2 for safety/equipment  Bed Mobility  Overal bed mobility Needs Assistance  Bed Mobility Rolling;Sidelying to Sit;Sit to Supine  Rolling Total assist;+2 for physical assistance  Sidelying to sit Total assist;+2 for physical assistance;HOB elevated  Supine to sit Max assist;+2 for physical assistance;HOB elevated  Sit to supine Total assist;+2 for physical assistance;HOB elevated  Sit to sidelying Total assist;+2 for physical assistance;HOB elevated  General bed mobility comments Rolling to right/left to place pad, total A of 2 for bed mobility with minimal effort to assist. moaning on pain with all movement,  Balance   Overall balance assessment Needs assistance  Sitting-balance support Feet unsupported;Bilateral upper extremity supported  Sitting balance-Leahy Scale Poor  Sitting balance - Comments Needing external support for sitting balance.  Vision- Assessment  Additional Comments eyes closed majority of session  Transfers  Overall transfer level Needs assistance  General transfer comment needs maximove  General Comments  General comments (skin integrity, edema, etc.) Pt disheveled with jello all over her; unaware; assisted with cleaning up  Other Exercises  Other Exercises SAT EOB @ 5 min. Posterior pevlic tilt; B knees in extension contractures adn B ankles in extension contractures; tight Adductors  OT - End of Session  Activity Tolerance Patient tolerated treatment well  Patient left in bed;with call bell/phone within reach;with bed alarm set;Other (comment) (chair position)  Nurse Communication Mobility status  OT Assessment/Plan  OT Plan Discharge plan remains appropriate  OT Visit Diagnosis Other abnormalities of gait and mobility (R26.89);Muscle weakness (generalized) (M62.81);Pain  Pain - Right/Left Right  Pain - part of body Leg  OT Frequency (ACUTE ONLY) Min 2X/week  Follow Up Recommendations SNF;Supervision/Assistance - 24 hour  OT Equipment Other (comment) (TBA)  AM-PAC OT "6 Clicks" Daily Activity Outcome Measure (Version 2)  Help from another person eating meals? 3  Help from another person taking care of personal grooming? 3  Help from another person toileting, which includes using toliet, bedpan, or urinal? 1  Help from another person bathing (including washing, rinsing, drying)? 2  Help from another person to put on and taking off regular upper body clothing? 3  Help from another person to put on and taking off regular lower body clothing? 1  6 Click Score 13  OT Goal Progression  Progress towards OT goals Not progressing toward goals - comment;Goals drowngraded-see care  plan  Acute Rehab OT Goals  Patient Stated Goal to not get out of bed  OT Goal Formulation With patient  Time For Goal Achievement 11/16/19  Potential to Achieve Goals Fair  OT Time Calculation  OT Start Time (ACUTE ONLY) 1222  OT Stop Time (ACUTE ONLY) 1258  OT Time Calculation (min) 36 min  OT General Charges  $OT Visit 1 Visit  OT Treatments  $Self Care/Home Management  8-22 mins  Maurie Boettcher, OT/L   Acute OT Clinical Specialist [place] Pager [phone] Office [phone]

## 2019-11-14 NOTE — Progress Notes (Addendum)
[facility] [place]  Subjective  - No new complaints.   Objective (!) 107/44 79 98 F (36.7 C) (Oral) 16 94%  Intake/Output Summary (Last 24 hours) at 11/14/2019 0752 Last data filed at 11/13/2019 1900 Gross per 24 hour  Intake 1459.8 ml  Output 1700 ml  Net -240.2 ml    Doppler signal PT right Foot, GT amp site still intact Right groin dressing changed this am by RN   Assessment/Planning: 68 y.o. female is s/p rightCommon Femoral endartectomywith vein patch and sartorius flap 21 Days Post-Op   Cont. Dakins.  We will change the dressing tomorrow Chronic indwelling urinary Catheter.   WBC 12.7 down from 13.9.  Will monitor Cont. Heparin will restart Eliquis at discharge  Lauren [name] 11/14/2019 7:52 AM --  Laboratory Lab Results: Recent Labs    11/12/19 0308 11/14/19 0310  WBC 13.9* 12.7*  HGB 9.4* 10.2*  HCT 30.0* 32.3*  PLT 500* 454*   BMET No results for input(s): NA, K, CL, CO2, GLUCOSE, BUN, CREATININE, CALCIUM in the last 72 hours.  COAG Lab Results  Component Value Date   INR 1.2 10/18/2019   INR 1.2 09/29/2019   INR 1.1 09/15/2019   No results found for: PTT  I have independently interviewed and examined patient and agree with PA assessment and plan above.  Leukocytosis again improving.  She is on day 2 of 5 ciprofloxacin for presumed UTI.  She has a chronic indwelling Foley catheter.  We will recheck wound tomorrow morning.  Nollan Muldrow C. Donzetta Matters, MD [facility] [place] Office: [phone] Pager: [phone]

## 2019-11-14 NOTE — Progress Notes (Signed)
[facility] for Heparin  Indication: history of DVT  Allergies  Allergen Reactions  . Ambien [Zolpidem Tartrate] Other (See Comments)    Per the pt, "it makes me go crazy"  . Sulfa Antibiotics Rash  . Ativan [Lorazepam] Other (See Comments)    Patient has the opposite effect when taking medication   . Elavil [Amitriptyline Hcl] Other (See Comments)    Gastritis  . Prozac [Fluoxetine Hcl] Anxiety and Other (See Comments)    irritability     Patient Measurements: Height: 5' 7.48" (171.4 cm) Weight: 205 lb 4 oz (93.1 kg) IBW/kg (Calculated) : 62.7 Heparin Dosing Weight: 83 kg  Vital Signs:    Labs: Recent Labs    11/12/19 0308 11/12/19 1133 11/13/19 0639 11/14/19 0310  HGB 9.4*  --   --  10.2*  HCT 30.0*  --   --  32.3*  PLT 500*  --   --  454*  HEPARINUNFRC 0.16* 0.39 0.40 0.54    Estimated Creatinine Clearance: 50.6 mL/min (A) (by C-G formula based on SCr of 1.24 mg/dL (H)).  Assessment: 68 yo female with h/o DVT, PTA Eliquis on hold. Pharmacy consulted to bridge with IV heparin.   Heparin level this morning is therapeutic at 0.54 on 1250 units/hr. CBC has remained stable this morning. No bleeding issues noted.   Goal of Therapy:  Heparin level 0.3-0.7 units/ml Monitor platelets by anticoagulation protocol: Yes   Plan:  Continue Heparin at 1250 units/hr Continue to follow daily CBC and heparin level  Monitor signs of bleeding    Thank you,   Erin Hearing PharmD., BCPS Clinical Pharmacist 11/14/2019 9:33 AM

## 2019-11-14 NOTE — Progress Notes (Signed)
Physical Therapy Treatment Patient Details Name: Lauren [name] MRN: [id number] DOB: 03/11/1951 Today's Date: 11/14/2019    History of Present Illness Pt is a 68 y.o. female admitted 09/29/19 with worsening pain R foot and great toe. S/p bilateral SFA endarterectomies 10/1. Now s/p RCFEA now with wound debridement and great toe amp 10/13. S/p R groin I&D and sartoirus muscle flap with wound vac placed 10/15. To OR for additional groin I&D on 10/20. s/p multiple I&Ds of right groin 10/23, 10/26. PMH includes HTN, HLD, COPD, DM, CAD.    PT Comments    Patient continues to demonstrate cognitive deficits relating to awareness, attention, following commands, orientation and problem solving. Confusion seems to be worsening. Pt requires total A for all bed mobility at this time. Limited mainly by pain and cognition. Pt with bil foot drop and limited ankle AROM. Also noted to have decreased knee flexion bilaterally. Donned prevalon boots to help prevent wounds. Placed pt in chair position at end of session. Discussed with nurse recommendation for vital Go tilt bed. Rn not sure if those beds are allowed on this floor? Will check before bed is ordered. Recommend palliative care consult to manage pain and delirium/cognition.  Will continue to follow.    Follow Up Recommendations  SNF     Equipment Recommendations  Other (comment)(defer)    Recommendations for Other Services       Precautions / Restrictions Precautions Precautions: Fall Precaution Comments: Limited knee flexion bilaterally Restrictions Weight Bearing Restrictions: No Other Position/Activity Restrictions: WB through R heel, L orthotic and shoe for OOB    Mobility  Bed Mobility Overal bed mobility: Needs Assistance Bed Mobility: Rolling;Sidelying to Sit;Sit to Supine Rolling: Total assist;+2 for physical assistance Sidelying to sit: Total assist;+2 for physical assistance;HOB elevated   Sit to supine: Total assist;+2 for  physical assistance;HOB elevated   General bed mobility comments: Rolling to right/left to place pad, total A of 2 for bed mobility with minimal effort to assist. moaning on pain with all movement,  Transfers                 General transfer comment: Deferred  Ambulation/Gait                 Stairs             Wheelchair Mobility    Modified Rankin (Stroke Patients Only)       Balance Overall balance assessment: Needs assistance Sitting-balance support: Feet unsupported;Bilateral upper extremity supported Sitting balance-Leahy Scale: Poor Sitting balance - Comments: Needing external support for sitting balance.                                    Cognition Arousal/Alertness: Lethargic Behavior During Therapy: WFL for tasks assessed/performed Overall Cognitive Status: Impaired/Different from baseline Area of Impairment: Orientation;Attention;Memory;Following commands;Safety/judgement;Awareness;Problem solving                 Orientation Level: Disoriented to;Place;Time;Situation Current Attention Level: Sustained Memory: Decreased short-term memory Following Commands: Follows one step commands with increased time;Follows one step commands inconsistently(with repetition) Safety/Judgement: Decreased awareness of deficits;Decreased awareness of safety Awareness: Intellectual Problem Solving: Decreased initiation;Requires verbal cues;Requires tactile cues;Slow processing General Comments: Pt very distracted and tangential in speech. Poor attention, needing constant cues to stay attended to task. Walked in and pt had orange jello all over self and bed, "where did this come from?" Read the clock  and stated correctly 12:49 pm but then when asked what the time was, pt stated 4:49 pm      Exercises General Exercises - Lower Extremity Ankle Circles/Pumps: PROM;Both;5 reps;Supine Heel Slides: PROM;Both(2 reps)    General Comments General  comments (skin integrity, edema, etc.): Helped pt wash up and change gown due to having orange jello everywhere.      Pertinent Vitals/Pain Pain Assessment: Faces Faces Pain Scale: Hurts whole lot Pain Location: everywhere with movement Pain Descriptors / Indicators: Grimacing;Guarding;Moaning Pain Intervention(s): Repositioned;Monitored during session;Limited activity within patient's tolerance    Home Living                      Prior Function            PT Goals (current goals can now be found in the care plan section) Progress towards PT goals: Not progressing toward goals - comment(pain and cognition limiting)    Frequency    Min 2X/week      PT Plan Current plan remains appropriate    Co-evaluation PT/OT/SLP Co-Evaluation/Treatment: Yes Reason for Co-Treatment: Complexity of the patient's impairments (multi-system involvement);For patient/therapist safety;Necessary to address cognition/behavior during functional activity PT goals addressed during session: Mobility/safety with mobility        AM-PAC PT "6 Clicks" Mobility   Outcome Measure  Help needed turning from your back to your side while in a flat bed without using bedrails?: Total Help needed moving from lying on your back to sitting on the side of a flat bed without using bedrails?: Total Help needed moving to and from a bed to a chair (including a wheelchair)?: Total Help needed standing up from a chair using your arms (e.g., wheelchair or bedside chair)?: Total Help needed to walk in hospital room?: Total Help needed climbing 3-5 steps with a railing? : Total 6 Click Score: 6    End of Session   Activity Tolerance: Patient limited by pain Patient left: in bed;with call bell/phone within reach(bed in chair position) Nurse Communication: Mobility status;Need for lift equipment PT Visit Diagnosis: Muscle weakness (generalized) (M62.81);Unsteadiness on feet (R26.81);Pain;Other abnormalities of  gait and mobility (R26.89) Pain - part of body: (generalized)     Time: [id number] PT Time Calculation (min) (ACUTE ONLY): 36 min  Charges:  $Therapeutic Activity: 8-22 mins                     Marisa Severin, PT, DPT Acute Rehabilitation Services Pager [phone] Office [place] 11/14/2019, 4:25 PM

## 2019-11-15 LAB — GLUCOSE, CAPILLARY
Glucose-Capillary: 113 mg/dL — ABNORMAL HIGH (ref 70–99)
Glucose-Capillary: 165 mg/dL — ABNORMAL HIGH (ref 70–99)
Glucose-Capillary: 113 mg/dL — ABNORMAL HIGH (ref 70–99)
Glucose-Capillary: 97 mg/dL (ref 70–99)

## 2019-11-15 LAB — CBC
HCT: 30.1 % — ABNORMAL LOW (ref 36.0–46.0)
Hemoglobin: 9.4 g/dL — ABNORMAL LOW (ref 12.0–15.0)
MCH: 26.2 pg (ref 26.0–34.0)
MCHC: 31.2 g/dL (ref 30.0–36.0)
MCV: 83.8 fL (ref 80.0–100.0)
Platelets: 482 10*3/uL — ABNORMAL HIGH (ref 150–400)
RBC: 3.59 MIL/uL — ABNORMAL LOW (ref 3.87–5.11)
RDW: 16.7 % — ABNORMAL HIGH (ref 11.5–15.5)
WBC: 13.5 10*3/uL — ABNORMAL HIGH (ref 4.0–10.5)
nRBC: 0 % (ref 0.0–0.2)

## 2019-11-15 LAB — HEPARIN LEVEL (UNFRACTIONATED): Heparin Unfractionated: 0.6 IU/mL (ref 0.30–0.70)

## 2019-11-15 NOTE — Plan of Care (Signed)
  Problem: Clinical Measurements: Goal: Will remain free from infection Outcome: Not Progressing   Problem: Activity: Goal: Risk for activity intolerance will decrease Outcome: Not Progressing   Problem: Skin Integrity: Goal: Risk for impaired skin integrity will decrease Outcome: Not Progressing

## 2019-11-15 NOTE — Progress Notes (Addendum)
[facility] [place]  Subjective  - Dressing change very painful.   Objective 116/61 83 98.8 F (37.1 C) (Oral) 20 95%  Intake/Output Summary (Last 24 hours) at 11/15/2019 0721 Last data filed at 11/15/2019 0007 Gross per 24 hour  Intake 401.38 ml  Output -  Net 401.38 ml    Necrotic tissue right LQ abdominal wall with malodor, muscle bed of right groin with good granulation tissue. GT toe amp area intact  Assessment/Planning: POD # 22 68 y.o.femaleis s/p rightCommon Femoral endartectomywith vein patch and sartorius flap  11/13/19 restarted Cipro for UTI foley exchanged last night.  She maintains chronic foley. Extensive abdominal wall breakdown.  We may need to return to the OR for debridement.  Dr. Donzetta Matters plans on discussing plans with the patients daughter.   Roxy Horseman 11/15/2019 7:21 AM --  Laboratory Lab Results: Recent Labs    11/14/19 0310 11/15/19 0254  WBC 12.7* 13.5*  HGB 10.2* 9.4*  HCT 32.3* 30.1*  PLT 454* 482*   BMET No results for input(s): NA, K, CL, CO2, GLUCOSE, BUN, CREATININE, CALCIUM in the last 72 hours.  COAG Lab Results  Component Value Date   INR 1.2 10/18/2019   INR 1.2 09/29/2019   INR 1.1 09/15/2019   No results found for: PTT   I have interviewed and examined patient with PA and agree with assessment and plan above. Will need to discuss OR plans with patient's daughter today. dispo pending wound progression. Leukocytosis relatively stable.   Donaldson Richter C. Donzetta Matters, MD [facility] [place] Office: [phone] Pager: [phone]  Addendum:  I had a 20-minute conversation at the bedside with [name] as well as her daughter Lauren [name] and brother Lauren [name] via telephone.  They are desiring transition to DNR which I have placed orders for.  Patient also would not like to have any further wound care.  I have reached out to palliative medicine who will arrange meeting with the  family to discuss goals of care and transition out of the hospital if and when possible.  Servando Snare

## 2019-11-15 NOTE — Progress Notes (Signed)
[facility] for Heparin  Indication: history of DVT  Allergies  Allergen Reactions  . Ambien [Zolpidem Tartrate] Other (See Comments)    Per the pt, "it makes me go crazy"  . Sulfa Antibiotics Rash  . Ativan [Lorazepam] Other (See Comments)    Patient has the opposite effect when taking medication   . Elavil [Amitriptyline Hcl] Other (See Comments)    Gastritis  . Prozac [Fluoxetine Hcl] Anxiety and Other (See Comments)    irritability     Patient Measurements: Height: 5' 7.48" (171.4 cm) Weight: 205 lb 4 oz (93.1 kg) IBW/kg (Calculated) : 62.7 Heparin Dosing Weight: 83 kg  Labs: Recent Labs    11/13/19 0639 11/14/19 0310 11/15/19 0254  HGB  --  10.2* 9.4*  HCT  --  32.3* 30.1*  PLT  --  454* 482*  HEPARINUNFRC 0.40 0.54 0.60    Estimated Creatinine Clearance: 50.6 mL/min (A) (by C-G formula based on SCr of 1.24 mg/dL (H)).  Assessment: 68 yo female with h/o DVT, PTA Eliquis on hold. Pharmacy consulted to bridge with IV heparin.   Heparin level this morning continues to be therapeutic at 0.6 on 1250 units/hr. CBC has remained stable. No bleeding issues noted.   Goal of Therapy:  Heparin level 0.3-0.7 units/ml Monitor platelets by anticoagulation protocol: Yes   Plan:  Continue Heparin at 1250 units/hr Continue to follow daily CBC and heparin level  Monitor signs of bleeding   Thank you,   Erin Hearing PharmD., BCPS Clinical Pharmacist 11/15/2019 8:54 AM

## 2019-11-16 LAB — CBC
HCT: 29.2 % — ABNORMAL LOW (ref 36.0–46.0)
Hemoglobin: 9.2 g/dL — ABNORMAL LOW (ref 12.0–15.0)
MCH: 26.4 pg (ref 26.0–34.0)
MCHC: 31.5 g/dL (ref 30.0–36.0)
MCV: 83.7 fL (ref 80.0–100.0)
Platelets: 439 10*3/uL — ABNORMAL HIGH (ref 150–400)
RBC: 3.49 MIL/uL — ABNORMAL LOW (ref 3.87–5.11)
RDW: 17 % — ABNORMAL HIGH (ref 11.5–15.5)
WBC: 13.8 10*3/uL — ABNORMAL HIGH (ref 4.0–10.5)
nRBC: 0 % (ref 0.0–0.2)

## 2019-11-16 LAB — BASIC METABOLIC PANEL
Anion gap: 15 (ref 5–15)
BUN: 25 mg/dL — ABNORMAL HIGH (ref 8–23)
CO2: 21 mmol/L — ABNORMAL LOW (ref 22–32)
Calcium: 8.9 mg/dL (ref 8.9–10.3)
Chloride: 95 mmol/L — ABNORMAL LOW (ref 98–111)
Creatinine, Ser: 1.4 mg/dL — ABNORMAL HIGH (ref 0.44–1.00)
GFR calc Af Amer: 44 mL/min — ABNORMAL LOW (ref >60)
GFR calc non Af Amer: 38 mL/min — ABNORMAL LOW (ref >60)
Glucose, Bld: 119 mg/dL — ABNORMAL HIGH (ref 70–99)
Potassium: 3.8 mmol/L (ref 3.5–5.1)
Sodium: 131 mmol/L — ABNORMAL LOW (ref 135–145)

## 2019-11-16 LAB — GLUCOSE, CAPILLARY
Glucose-Capillary: 107 mg/dL — ABNORMAL HIGH (ref 70–99)
Glucose-Capillary: 189 mg/dL — ABNORMAL HIGH (ref 70–99)
Glucose-Capillary: 150 mg/dL — ABNORMAL HIGH (ref 70–99)
Glucose-Capillary: 89 mg/dL (ref 70–99)

## 2019-11-16 LAB — HEPARIN LEVEL (UNFRACTIONATED): Heparin Unfractionated: 0.57 IU/mL (ref 0.30–0.70)

## 2019-11-16 NOTE — Progress Notes (Addendum)
[facility]  Subjective  - No new complaints   Objective (!) 119/53 92 98.4 F (36.9 C) (Oral) (!) 22 95%  Intake/Output Summary (Last 24 hours) at 11/16/2019 0817 Last data filed at 11/16/2019 0538 Gross per 24 hour  Intake -  Output 1450 ml  Net -1450 ml    Right GT amp site intact Foot warm and well perfused Right groin changed by RN today  Assessment/Planning: POD # 23 68 y.o.femaleis s/p rightCommon Femoral endartectomywith vein patch and sartorius flap  Cipro for UTI Extensive abdominal wall breakdown.  We may need to return to the OR for debridement.  Dr. Donzetta Matters plans on discussing plans with the patients daughter.   Roxy Horseman 11/16/2019 8:17 AM --  Laboratory Lab Results: Recent Labs    11/15/19 0254 11/16/19 0322  WBC 13.5* 13.8*  HGB 9.4* 9.2*  HCT 30.1* 29.2*  PLT 482* 439*   BMET Recent Labs    11/16/19 0322  NA 131*  K 3.8  CL 95*  CO2 21*  GLUCOSE 119*  BUN 25*  CREATININE 1.40*  CALCIUM 8.9    COAG Lab Results  Component Value Date   INR 1.2 10/18/2019   INR 1.2 09/29/2019   INR 1.1 09/15/2019   No results found for: PTT  I have independently interviewed and examined patient and agree with PA assessment and plan above.  Patient is more lucid this morning states that she understands our conversation yesterday and there will be palliative care meeting.  Their input is much appreciated.  Patient does not desire to have any further wound care of the right groin given the pain that is causing her and in discussion with her as well as her daughter and brother would like to know options to transition to comfort route.  Flavia Bruss C. Donzetta Matters, MD [facility] Office: [phone] Pager: [phone]

## 2019-11-16 NOTE — Progress Notes (Signed)
[place] for Heparin  Indication: history of DVT  Allergies  Allergen Reactions  . Ambien [Zolpidem Tartrate] Other (See Comments)    Per the pt, "it makes me go crazy"  . Sulfa Antibiotics Rash  . Ativan [Lorazepam] Other (See Comments)    Patient has the opposite effect when taking medication   . Elavil [Amitriptyline Hcl] Other (See Comments)    Gastritis  . Prozac [Fluoxetine Hcl] Anxiety and Other (See Comments)    irritability     Patient Measurements: Height: 5' 7.48" (171.4 cm) Weight: 205 lb 4 oz (93.1 kg) IBW/kg (Calculated) : 62.7 Heparin Dosing Weight: 83 kg  Labs: Recent Labs    11/14/19 0310 11/15/19 0254 11/16/19 0322  HGB 10.2* 9.4* 9.2*  HCT 32.3* 30.1* 29.2*  PLT 454* 482* 439*  HEPARINUNFRC 0.54 0.60 0.57  CREATININE  --   --  1.40*    Estimated Creatinine Clearance: 44.8 mL/min (A) (by C-G formula based on SCr of 1.4 mg/dL (H)).  Assessment: 68 yo female with h/o DVT, PTA Eliquis on hold. Pharmacy consulted to bridge with IV heparin.   Heparin level this morning continues to be therapeutic at 0.5 on 1250 units/hr. CBC has remained stable. No bleeding issues noted.   Goal of Therapy:  Heparin level 0.3-0.7 units/ml Monitor platelets by anticoagulation protocol: Yes   Plan:  Continue Heparin at 1250 units/hr Continue to follow daily CBC and heparin level  Monitor signs of bleeding   Thank you,   Erin Hearing PharmD., BCPS Clinical Pharmacist 11/16/2019 8:42 AM

## 2019-11-16 NOTE — Progress Notes (Signed)
Nutrition Follow up  DOCUMENTATION CODES:   Obesity unspecified  INTERVENTION:   No BM x 6 days- current on regimen   Continue 30 ml Prostat BID, each supplement provides 100 kcals and 15 grams protein.   Continue Ensure MAX Protein po once daily, each supplement provides 150 kcal and 30 grams of protein  Continue Snacks BID  Obtain new weight   MVI daily   NUTRITION DIAGNOSIS:   Increased nutrient needs related to post-op healing as evidenced by estimated needs.  Ongoing  GOAL:   Patient will meet greater than or equal to 90% of their needs   Progressing   MONITOR:   PO intake, Supplement acceptance, Weight trends, Labs, I & O's  REASON FOR ASSESSMENT:   Other (Comment)(LOS)    ASSESSMENT:   Patient with PMH significant for HTN, HLD, DM, severe COPD, cor pulmonale, and CAD. Presents this admission with R lower extremity critical limb ischemia.   10/1- R iliofemoral endart w/ vein angioplasty 10/13-R groin debridement, R great toe amputation 10/15- R groin debridement, wound VAC 10/17- R groin debridement, change wound VAC 10/20 R groin debridement, change wound VAC 10/23- R groin debridement, change wound VAC  10/26- R groin debridement, change wound VAC   Per VS, pt with extensive abdominal wall breakdown. May need to return to OR for debridement. Palliative to meet with family prior for GOC.   Spoke with daughter at bedside. Pt's mentation better. Appetite progressing. Meal completions charted as 50-100% for her last eight meals. Daughter tries to come at lunch and dinner to help pt eat. Not taking supplements consistently. Encouraged high protein intake at meals.   Admission weight: 90.7 kg  Current weight: 93.1 kg (last obtained 10/20)  I/O: -13,048 ml since 11/4 UOP: 2,200 ml x 24 hrs   Medications: colace, ferrous sulfate, folic acid, 40 mg lasix BID, SS novolog, lantus, mag-ox, MVI with iron, 10 mEq KCl BID, senokot Labs: Na 131 (L) CBG 90-189  Cr 1.40-trending up  Diet Order:   Diet Order            Diet regular Room service appropriate? Yes; Fluid consistency: Thin  Diet effective now              EDUCATION NEEDS:   Education needs have been addressed  Skin:  Skin Assessment: Skin Integrity Issues: Skin Integrity Issues:: Wound VAC, Incisions, Stage II Stage II: buttocks Wound Vac: R groin Incisions: R foot/ R groin  Last BM:  11/12  Height:   Ht Readings from Last 1 Encounters:  10/18/19 5' 7.48" (1.714 m)    Weight:   Wt Readings from Last 1 Encounters:  10/18/19 93.1 kg    Ideal Body Weight:  63.6 kg  BMI:  Body mass index is 31.69 kg/m.  Estimated Nutritional Needs:   Kcal:  1700-1900 kcal  Protein:  85-90 grams  Fluid:  >/= 1.7 L/day   Mariana Single RD, LDN Clinical Nutrition Pager # - [phone]

## 2019-11-17 DIAGNOSIS — Z66 Do not resuscitate: Secondary | ICD-10-CM

## 2019-11-17 DIAGNOSIS — I70201 Unspecified atherosclerosis of native arteries of extremities, right leg: Secondary | ICD-10-CM

## 2019-11-17 DIAGNOSIS — R52 Pain, unspecified: Secondary | ICD-10-CM

## 2019-11-17 DIAGNOSIS — R531 Weakness: Secondary | ICD-10-CM

## 2019-11-17 DIAGNOSIS — Z515 Encounter for palliative care: Secondary | ICD-10-CM

## 2019-11-17 LAB — CBC
HCT: 29 % — ABNORMAL LOW (ref 36.0–46.0)
Hemoglobin: 8.9 g/dL — ABNORMAL LOW (ref 12.0–15.0)
MCH: 26 pg (ref 26.0–34.0)
MCHC: 30.7 g/dL (ref 30.0–36.0)
MCV: 84.8 fL (ref 80.0–100.0)
Platelets: 441 10*3/uL — ABNORMAL HIGH (ref 150–400)
RBC: 3.42 MIL/uL — ABNORMAL LOW (ref 3.87–5.11)
RDW: 17.2 % — ABNORMAL HIGH (ref 11.5–15.5)
WBC: 15.3 10*3/uL — ABNORMAL HIGH (ref 4.0–10.5)
nRBC: 0 % (ref 0.0–0.2)

## 2019-11-17 LAB — GLUCOSE, CAPILLARY
Glucose-Capillary: 94 mg/dL (ref 70–99)
Glucose-Capillary: 172 mg/dL — ABNORMAL HIGH (ref 70–99)

## 2019-11-17 LAB — HEPARIN LEVEL (UNFRACTIONATED): Heparin Unfractionated: 0.62 IU/mL (ref 0.30–0.70)

## 2019-11-17 MED ORDER — TAPENTADOL HCL 50 MG PO TABS
50.0000 mg | ORAL_TABLET | Freq: Two times a day (BID) | ORAL | Status: DC
  Administered 2019-11-17 – 2019-11-18 (×2): 50 mg via ORAL
  Filled 2019-11-17 (×2): qty 1

## 2019-11-17 MED ORDER — ALPRAZOLAM 0.5 MG PO TABS
0.5000 mg | ORAL_TABLET | Freq: Three times a day (TID) | ORAL | Status: DC | PRN
  Administered 2019-11-18: 11:00:00 0.5 mg via ORAL
  Filled 2019-11-17: qty 1

## 2019-11-17 MED ORDER — HYDROMORPHONE HCL 1 MG/ML IJ SOLN
0.5000 mg | INTRAMUSCULAR | Status: DC | PRN
  Administered 2019-11-18 (×3): 0.5 mg via INTRAVENOUS
  Filled 2019-11-17 (×3): qty 1

## 2019-11-17 NOTE — TOC Progression Note (Addendum)
Transition of Care [facility]) - Progression Note    Patient Details  Name: Lauren [name] MRN: [id number] Date of Birth: 1951-05-10  Transition of Care [facility]) CM/SW [place], [place] Phone Number: 11/17/2019, 4:15 PM  Clinical Narrative:     4:33pm-UPDATE- spoke with Hospice liaison, Anderson Malta and she confirmed they have the referral.    4:13pm-CSW called Hospice liaison ,  Farrel Gordon, left voice message to return call regarding referral. CSW awaiting on return call.  CSW will continue to follow   Thurmond Butts, MSW, [place] Social Worker [phone]   Expected Discharge Plan: Skilled Nursing Facility Barriers to Discharge: Continued Medical Work up  Expected Discharge Plan and Services Expected Discharge Plan: [place]   Discharge Planning Services: CM Consult                                           Social Determinants of Health (SDOH) Interventions    Readmission Risk Interventions Readmission Risk Prevention Plan 08/07/2018  Transportation Screening Complete  Medication Review ([place]) Complete  PCP or Specialist appointment within 3-5 days of discharge Patient refused  [place] or Home Care Consult Complete  SW Recovery Care/Counseling Consult Complete  Palliative Care Screening Complete  Medication Reconcilation (Pharmacy) Complete  Skilled Nursing Facility Complete  Some recent data might be hidden

## 2019-11-17 NOTE — Consult Note (Signed)
Consultation Note Date: 11/17/2019   Patient Name: Lauren [name]  DOB: [birth year]  MRN: [id number]  Age / Sex: 68 y.o., female  PCP: Lauren Late, MD Referring Physician: Thomes [name]*  Reason for Consultation: Establishing goals of care and Psychosocial/spiritual support  HPI/Patient Profile: 68 y.o. female   admitted on 09/29/2019 with  past medical history significant for hypertension, hyperlipidemia, insulin-dependent diabetes mellitus, severe COPD, cor pulmonale,andCADwho presents with chief complaintworsening pain R foot and great toe.Surgical history significant for right carotid endarterectomy in 2016 by Dr. Kellie [name]. Patient is also had left SFA and popliteal stenting by Dr. Donzetta [name] in July 2019. She describes over the past 2 months that she has had occasional rest pain of right foot which has been increasingly painful and more regular over the past 2 weeks. She has bilateral foot drop and admittedly does not walk anymore. She is however able to transfer from chair with some assist. 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              She underwent aortogram 09/15/2019 by Dr. Donzetta [name].  Findings:The aorta and iliac segments appear again diminutive she has significant bowel gas but the runoff is non-limited. The right lower extremity there appears to be near occlusion of the common femoral artery at the profunda femoris takeoff. The SFA is diminutive but there is no greater than 50% stenosis and dominant runoff is via the posterior tibial artery. Pullback gradient was approximately 40 mmHg across the common femoral artery.  Patient will need right common femoral endarterectomy.  Hospital Course:   Procedure(s): ENDARTERECTOMY FEMORAL with vein patch angioplast.              Our biggest concern is incisional care.  She has large panus and large hernia so we place ABD pad at  least 3 times a day to keep the incision clean and dry with soap and water cleaning at each dressing change.               She has a chronic indwelling foley catheter last exchanged 07/31/2019 and is on Macrobid daily.  We will exchange the foley today 10/07/2019.  She does not ambulate and can stand pivot with a 2 person assit.  Her left foot is showing ischemic changes to the GT and all toes are cool to touch.  We are watching her left foot demarcate and if it becomes infected or the pain is intolerable no further intervention is needed now.              According to the patient's daughter patient has had continued physical and functional decline over the last 10 years.  She is wheelchair-bound at baseline and has had multiple back surgeries resulting and secondary chronic pain.Her husband died unexpectedly 10 days ago.   Unfortunately the patient has continued to fail to thrive.  Her oral intake is limited to sips and bites, she has ongoing pain issues, she has a sacral skin breakdown and a complicated postsurgical  wound.   Currently patient does not have medical decision capacity and family face treatment option decisions, advanced directive decisions and anticipatory care needs.    Clinical Assessment and Goals of Care:   This NP Lauren [name] reviewed medical records, received report from team, assessed the patient and then meet at the patient's bedside along with her daughter Lauren [name] to discuss diagnosis, prognosis, GOC, EOL wishes disposition and options.  Concept of Hospice and Palliative Care were discussed  A detailed discussion was had today regarding advanced directives.  Concepts specific to code status, artifical feeding and hydration, continued IV antibiotics and rehospitalization was had.  The difference between a aggressive medical intervention path  and a palliative comfort care path for this patient at this time was had.  Values and goals of care important to patient and family  were attempted to be elicited.   Created space and opportunity for daughter to explore her thoughts and feelings regarding her mother's current medical situation.  She verbalizes an understanding of the poor prognosis and a sense of knowing that "my mother does not want this, she has been ready for some time now"  MOST form introduced completed to reflect full comfort  Natural trajectory and expectations at EOL were discussed.  Questions and concerns addressed.   Family encouraged to call with questions or concerns.    Emotional support offered  PMT will continue to support holistically.   SUMMARY OF RECOMMENDATIONS    Code Status/Advance Care Planning:  DNR   Focus of care is comfort, quality and dignity.    Symptom Management:   Pain-Dilaudid 0.5 mg IV every 3 hours as needed  Wean Nucynta -decrease 50 mg to every 12 hrs, re-evaluate for discontinue  Anxiety - Xanax 0.5 mg p.o./sublingual 3 times daily as needed  Palliative Prophylaxis:   Frequent Pain Assessment, Oral Care and Palliative Wound Care  Additional Recommendations (Limitations, Scope, Preferences):  Full Comfort Care  Psycho-social/Spiritual:   Desire for further Chaplaincy support:yes  Additional Recommendations: Education on Hospice  Prognosis:   < 2 weeks  Discharge Planning: Family is hopeful for [facility] facility      Primary Diagnoses: Present on Admission: . Femoral artery occlusion, right ([place]) . PAD (peripheral artery disease) ([place])   I have reviewed the medical record, interviewed the patient and family, and examined the patient. The following aspects are pertinent.  Past Medical History:  Diagnosis Date  . Adenomatous colon polyp   . Anemia   . Anxiety   . AVM (arteriovenous malformation)    cecum  . CAD (coronary artery disease)   . Carotid artery occlusion   . Cataract    bil cateracts removed  . CHF (congestive heart failure)  ([place])   . COPD (chronic obstructive pulmonary disease) ([place])   . Cough   . DDD (degenerative disc disease), lumbar   . Depression   . Diabetes mellitus    Type II  . DVT (deep venous thrombosis) ([place])    RLE 05/03/19  . Elevated LFTs   . Endometrial ca [facility])    endometrial ca dx 4/11  . Foot drop   . GERD (gastroesophageal reflux disease)   . Gout 2015  . Hernia of flank   . Hydronephrosis   . Hyperlipidemia   . Hypertension   . Insomnia   . Neuropathy   . OAB (overactive bladder)   . Osteoarthritis   . Peripheral neuropathy    following chemotherapy  .  Peripheral vascular disease ([place])   . Pneumonia 07/2017  . Psoriasis   . Pulmonary nodule    since 2016  . Radiation 05/01/2011-06/11/11   5600 cGy 28 fxs/periaortic  . Stroke [facility]) 2015   tia- Patient denies  . Thyroid nodule   . TIA (transient ischemic attack) 2015  . 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 Uterine cancer ([place])   . Ventral hernia    postoperative since 2009   Social History   Socioeconomic History  . Marital status: Married    Spouse name: Not on file  . Number of children: 1  . Years of education: College  . Highest education level: Not on file  Occupational History  . Occupation: disabled  Social Needs  . Financial resource strain: Not on file  . Food insecurity    Worry: Not on file    Inability: Not on file  . Transportation needs    Medical: Not on file    Non-medical: Not on file  Tobacco Use  . Smoking status: Former Smoker    Packs/day: 0.10    Years: 40.00    Pack years: 4.00    Types: Cigarettes    Quit date: 01/2017    Years since quitting: 2.8  . Smokeless tobacco: Never Used  Substance and Sexual Activity  . Alcohol use: No    Alcohol/week: 0.0 standard drinks  . Drug use: No  . Sexual activity: Never  Lifestyle  . Physical activity    Days per week: Not on file    Minutes per session: Not on file  . Stress: Not on file  Relationships   . Social Herbalist on phone: Not on file    Gets together: Not on file    Attends religious service: Not on file    Active member of club or organization: Not on file    Attends meetings of clubs or organizations: Not on file    Relationship status: Not on file  Other Topics Concern  . Not on file  Social History Narrative   Married, 1 daughter   Disabled   Right-hand   Caffeine: caffeine free sodas   Family History  Problem Relation Age of Onset  . Stroke Mother   . Irritable bowel syndrome Mother   . Heart attack Father 48       died  . Heart disease Father        Heart Disease before age 76, rheumatic heart disease  . Hyperlipidemia Father   . Hypertension Father   . Diabetes Daughter   . Hypertension Daughter   . Irritable bowel syndrome Daughter   . Colon cancer Neg Hx   . Esophageal cancer Neg Hx   . Pancreatic cancer Neg Hx   . Rectal cancer Neg Hx   . Stomach cancer Neg Hx   . Neuropathy Neg Hx    Scheduled Meds: . sodium chloride   Intravenous Once  . amLODipine  5 mg Oral Daily  . Chlorhexidine Gluconate Cloth  6 each Topical Daily  . collagenase   Topical Daily  . docusate sodium  100 mg Oral Daily  . escitalopram  20 mg Oral Daily  . feeding supplement (PRO-STAT SUGAR FREE 64)  30 mL Oral BID  . ferrous sulfate  325 mg Oral Daily  . folic acid  [id number] mcg Oral Q1200  . furosemide  40 mg Oral BID  . gabapentin  600  mg Oral TID   And  . gabapentin  1,200 mg Oral QHS  . insulin aspart  0-20 Units Subcutaneous TID WC  . insulin aspart  10 Units Subcutaneous Once  . insulin glargine  50 Units Subcutaneous Q1200  . isosorbide mononitrate  30 mg Oral Daily  . losartan  100 mg Oral Q1500  . magnesium oxide  400 mg Oral Daily  . metaxalone  800 mg Oral TID  . metoprolol succinate  50 mg Oral Daily  . mometasone-formoterol  2 puff Inhalation BID  . multivitamins with iron  1 tablet Oral Daily  . nortriptyline  30 mg Oral QHS  . oxyCODONE  15 mg  Oral Q12H  . pantoprazole  40 mg Oral Daily  . polyethylene glycol  17 g Oral Daily  . potassium chloride  10 mEq Oral BID  . Ensure Max Protein  11 oz Oral Daily  . rosuvastatin  5 mg Oral QPM  . senna  1 tablet Oral BID  . sodium chloride flush  10-40 mL Intracatheter Q12H  . sodium hypochlorite   Irrigation BID  . tapentadol  50 mg Oral Q6H  . traMADol  50 mg Oral Q6H   Continuous Infusions: . heparin 1,250 Units/hr (11/17/19 0845)  . magnesium sulfate bolus IVPB     PRN Meds:.acetaminophen **OR** acetaminophen, albuterol, alum & mag hydroxide-simeth, bisacodyl, diphenhydrAMINE, Gerhardt's butt cream, guaiFENesin-dextromethorphan, hydrALAZINE, HYDROmorphone (DILAUDID) injection, insulin aspart, labetalol, magnesium sulfate bolus IVPB, metoprolol tartrate, nitroGLYCERIN, ondansetron, ondansetron, phenol, potassium chloride, sodium chloride flush, sodium phosphate Medications Prior to Admission:  Prior to Admission medications   Medication Sig Start Date End Date Taking? Authorizing Provider  Albuterol Sulfate (PROAIR RESPICLICK) [id number] (90 Base) MCG/ACT AEPB Inhale 2 puffs into the lungs every 6 (six) hours as needed (congestion, cough, wheezing).    Yes [provider]  amLODipine (NORVASC) 5 MG tablet Take 1 tablet (5 mg total) by mouth daily. 08/26/17  Yes Short, Noah Delaine, MD  betamethasone dipropionate (DIPROLENE) 0.05 % ointment Apply 1 application topically 2 (two) times daily as needed (psoriasis).    Yes [provider]  Cholecalciferol (VITAMIN D3) 1000 UNITS CAPS Take 1,000 Units by mouth daily.    Yes [provider]  escitalopram (LEXAPRO) 20 MG tablet Take 20 mg by mouth daily.   Yes [provider]  esomeprazole (NEXIUM) 40 MG capsule Take 40 mg by mouth every evening.    Yes [provider]  ferrous sulfate 325 (65 FE) MG tablet Take 325 mg by mouth daily.    Yes [provider]  Fluticasone-Salmeterol (ADVAIR) 250-50  MCG/DOSE AEPB Inhale 1 puff into the lungs 2 (two) times daily.    Yes [provider]  folic acid (FOLVITE) [id number] MCG tablet Take 800 mcg by mouth daily at 12 noon.    Yes [provider]  furosemide (LASIX) 40 MG tablet Take 40 mg by mouth 2 (two) times daily.  08/14/17  Yes [provider]  gabapentin (NEURONTIN) 600 MG tablet Take 600-1,200 mg by mouth See admin instructions. Take 600 mg by mouth in the morning, afternoon and evening and 1200 mg at bedtime   Yes [provider]  insulin glargine (LANTUS) 100 UNIT/ML injection Inject 50 Units into the skin daily at 12 noon.    Yes [provider]  Insulin Glargine-Lixisenatide (SOLIQUA) 100-33 UNT-MCG/ML SOPN Inject 60 Units into the skin every morning.  04/14/19  Yes [provider]  isosorbide mononitrate (IMDUR) 30 MG 24  hr tablet Take 1 tablet by mouth once daily 09/22/19  Yes Croitoru, Mihai, MD  losartan (COZAAR) 100 MG tablet Take 100 mg by mouth daily at 3 pm.  10/22/18  Yes [provider]  magnesium oxide (MAG-OX) 400 MG tablet Take 400 mg by mouth daily.   Yes [provider]  Melatonin 10 MG TABS Take 10 mg by mouth at bedtime.    Yes [provider]  metaxalone (SKELAXIN) 800 MG tablet Take 800 mg by mouth 3 (three) times daily. 0800, 1200, 1600   Yes [provider]  methotrexate 2.5 MG tablet Take 5 mg by mouth See admin instructions. Take 5 mg by mouth twice a day on Wednesdays 04/09/18  Yes [provider]  metoprolol succinate (TOPROL-XL) 50 MG 24 hr tablet Take 50 mg by mouth daily. 11/21/16  Yes [provider]  Multiple Vitamin (MULTIVITAMIN WITH MINERALS) TABS tablet Take 1 tablet by mouth daily.   Yes [provider]  nitrofurantoin, macrocrystal-monohydrate, (MACROBID) 100 MG capsule Take 100 mg by mouth at bedtime.  12/24/18  Yes [provider]  nortriptyline (PAMELOR) 10 MG capsule Take 30 mg by mouth at  bedtime.    Yes [provider]  OXYCONTIN 15 MG 12 hr tablet Take 1 tablet (15 mg total) by mouth every 12 (twelve) hours. 08/25/17  Yes Short, Noah Delaine, MD  potassium chloride (MICRO-K) 10 MEQ CR capsule Take 10 mEq by mouth 2 (two) times daily. 07/26/19  Yes [provider]  rosuvastatin (CRESTOR) 5 MG tablet Take 5 mg by mouth every evening.  01/13/17  Yes [provider]  senna (SENOKOT) 8.6 MG tablet Take 1 tablet by mouth 2 (two) times daily.    Yes [provider]  tapentadol (NUCYNTA) 50 MG tablet Take 1 tablet (50 mg total) by mouth every 6 (six) hours. 6 am, noon, 6pm  And midnight Patient taking differently: Take 50 mg by mouth every 6 (six) hours. 0800, 1200, 1800, 0000 08/25/17  Yes Short, Mackenzie, MD  vitamin B-12 (CYANOCOBALAMIN) 1000 MCG tablet Take 1,000 mcg by mouth daily.   Yes [provider]  apixaban (ELIQUIS) 5 MG TABS tablet Take 5 mg by mouth 2 (two) times daily.    [provider]  nitroGLYCERIN (NITROSTAT) 0.4 MG SL tablet Place 1 tablet (0.4 mg total) under the tongue every 5 (five) minutes as needed for chest pain. 01/26/18   Croitoru, Mihai, MD  ondansetron (ZOFRAN-ODT) 4 MG disintegrating tablet Take 4 mg by mouth every 8 (eight) hours as needed for nausea or vomiting.  09/16/16   [provider]  oxyCODONE-acetaminophen (PERCOCET/ROXICET) 5-325 MG tablet Take 1 tablet by mouth every 4 (four) hours as needed for moderate pain. 10/07/19   [name], PA-C  RELION INSULIN SYRINGE 1ML/31G 31G X 5/16" 1 ML MISC See admin instructions. 07/06/19   [provider]   Allergies  Allergen Reactions  . Ambien [Zolpidem Tartrate] Other (See Comments)    Per the pt, "it makes me go crazy"  . Sulfa Antibiotics Rash  . Ativan [Lorazepam] Other (See Comments)    Patient has the opposite effect when taking medication   . Elavil [Amitriptyline Hcl] Other (See Comments)    Gastritis  . Prozac [Fluoxetine Hcl]  Anxiety and Other (See Comments)    irritability    Review of Systems  Physical Exam  Vital Signs: BP 109/62   Pulse 75   Temp 99 F (37.2 C) (Oral)  Resp 15   Ht 5' 7.48" (1.714 m)   Wt 93.1 kg   SpO2 93%   BMI 31.69 kg/m  Pain Scale: 0-10 POSS *See Group Information*: S-Acceptable,Sleep, easy to arouse Pain Score: Asleep   SpO2: SpO2: 93 % O2 Device:SpO2: 93 % O2 Flow Rate: .O2 Flow Rate (L/min): 3 L/min  IO: Intake/output summary:   Intake/Output Summary (Last 24 hours) at 11/17/2019 1527 Last data filed at 11/17/2019 0900 Gross per 24 hour  Intake 670.95 ml  Output 10600 ml  Net -9929.05 ml    LBM: Last BM Date: 11/17/19 Baseline Weight: Weight: 90.7 kg Most recent weight: Weight: 93.1 kg     Palliative Assessment/Data:   30 % at best   Discussed with bedside nurse/Starr and Dr Cain/in OR  Time In: 1415 Time Out: 1530 Time Total: 75 minutes Greater than 50%  of this time was spent counseling and coordinating care related to the above assessment and plan.  Signed by: Lauren Lessen, NP   Please contact Palliative Medicine Team phone at [phone] for questions and concerns.  For individual provider: See Shea Evans

## 2019-11-17 NOTE — Plan of Care (Signed)
Continue to monitor

## 2019-11-17 NOTE — Progress Notes (Signed)
Manufacturing engineer  Referral received from [place] with PMT, for residential hospice.  Will update family and TOC manager once [facility] has a bed to offer.  Thank you, Venia Carbon RN, BSN, [facility] Liaison (in [place]) [phone]

## 2019-11-17 NOTE — Progress Notes (Signed)
Chaplain responded to request for Spiritual Care. Lauren [name] was happy to have The Lord's Prayer prayed over her along with a good night's prayer. Chaplain remains available for support as needs arise.   Chaplain Resident, Evelene Croon, M Div

## 2019-11-17 NOTE — Progress Notes (Signed)
OT Cancellation Note  Patient Details Name: Lauren [name] MRN: [id number] DOB: [birth year]   Cancelled Treatment:    Reason Eval/Treat Not Completed: Pain limiting ability to participate;Other (comment);Patient declined, no reason specified Pt declined participating in [place].Marland Kitchen Await note from Palliative Medicine Team for POC updates. Will continue to follow acutely per POC.  Lanier Clam., COTA/L Acute Rehabilitation Services [phone] [place] 11/17/2019, 1:06 PM

## 2019-11-17 NOTE — Progress Notes (Addendum)
    Called palliative care they will see her today. Patient does not want to pursue further invasive care. Gen NAD  Roxy Horseman PA-C  I have independently interviewed and examined patient and agree with PA assessment and plan above.  Appreciate palliative care discussion.  We will follow-up with family regarding disposition.  Labrea Eccleston C. Donzetta Matters, MD [facility] [place] Office: [phone] Pager: [phone]

## 2019-11-17 NOTE — Plan of Care (Signed)
  Problem: Clinical Measurements: Goal: Ability to maintain clinical measurements within normal limits will improve 11/17/2019 2253 by Drenda Freeze, RN Outcome: Not Progressing  Problem: Clinical Measurements: Goal: Will remain free from infection      11/17/2019 2253 by Drenda Freeze, RN Outcome: Progressing

## 2019-11-17 NOTE — Progress Notes (Signed)
PT Cancellation Note  Patient Details Name: Lauren [name] MRN: [id number] DOB: 1951/10/13   Cancelled Treatment:    Reason Eval/Treat Not Completed: Other (comment) Pt declined participating in therapy this am. Await note from Palliative Medicine Team for POC updates. PT will continue to follow acutely.    Earney Navy, PTA Acute Rehabilitation Services Pager: [phone] Office: [phone]   11/17/2019, 10:45 AM

## 2019-11-17 NOTE — Progress Notes (Signed)
[place] for Heparin  Indication: history of DVT  Allergies  Allergen Reactions  . Ambien [Zolpidem Tartrate] Other (See Comments)    Per the pt, "it makes me go crazy"  . Sulfa Antibiotics Rash  . Ativan [Lorazepam] Other (See Comments)    Patient has the opposite effect when taking medication   . Elavil [Amitriptyline Hcl] Other (See Comments)    Gastritis  . Prozac [Fluoxetine Hcl] Anxiety and Other (See Comments)    irritability     Patient Measurements: Height: 5' 7.48" (171.4 cm) Weight: 205 lb 4 oz (93.1 kg) IBW/kg (Calculated) : 62.7 Heparin Dosing Weight: 83 kg  Labs: Recent Labs    11/15/19 0254 11/16/19 0322 11/17/19 0337  HGB 9.4* 9.2* 8.9*  HCT 30.1* 29.2* 29.0*  PLT 482* 439* 441*  HEPARINUNFRC 0.60 0.57 0.62  CREATININE  --  1.40*  --     Estimated Creatinine Clearance: 44.8 mL/min (A) (by C-G formula based on SCr of 1.4 mg/dL (H)).  Assessment: 68 yo female with h/o DVT, PTA Eliquis on hold. Pharmacy consulted to bridge with IV heparin.  Palliative care to see.  -heparin level at goal  Goal of Therapy:  Heparin level 0.3-0.7 units/ml Monitor platelets by anticoagulation protocol: Yes   Plan:  Continue Heparin at 1250 units/hr Continue to follow daily CBC and heparin level   Hildred Laser, PharmD Clinical Pharmacist **Pharmacist phone directory can now be found on [email or web address] ([email or web address]).  Listed under [place].

## 2019-11-18 MED ORDER — HYDROMORPHONE HCL 1 MG/ML IJ SOLN: 0.5000 mg | INTRAMUSCULAR | 0 refills | Status: AC | PRN

## 2019-11-18 MED ORDER — TRAMADOL HCL 50 MG PO TABS: 50.0000 mg | ORAL_TABLET | Freq: Four times a day (QID) | ORAL | Status: AC

## 2019-11-18 MED ORDER — ALPRAZOLAM 0.5 MG PO TABS: 0.5000 mg | ORAL_TABLET | Freq: Three times a day (TID) | ORAL | 0 refills | Status: AC | PRN

## 2019-11-18 MED ORDER — GERHARDT'S BUTT CREAM: 1.0000 "application " | TOPICAL_CREAM | CUTANEOUS | Status: AC | PRN

## 2019-11-18 NOTE — Progress Notes (Addendum)
  Progress Note    11/18/2019 9:10 AM 25 Days Post-Op  Subjective:  No complaints    Vitals:   11/18/19 0742 11/18/19 0758  BP:  120/62  Pulse:  93  Resp:    Temp:  98.5 F (36.9 C)  SpO2: 95% 98%    Physical Exam: General:  No distress; resting comfortably  CBC    Component Value Date/Time   WBC 15.3 (H) 11/17/2019 0337   RBC 3.42 (L) 11/17/2019 0337   HGB 8.9 (L) 11/17/2019 0337   HGB 11.2 01/28/2018 1035   HGB 12.0 09/29/2011 1016   HCT 29.0 (L) 11/17/2019 0337   HCT 34.4 01/28/2018 1035   HCT 35.3 09/29/2011 1016   PLT 441 (H) 11/17/2019 0337   PLT 384 (H) 01/28/2018 1035   MCV 84.8 11/17/2019 0337   MCV 88 01/28/2018 1035   MCV 88.0 09/29/2011 1016   MCH 26.0 11/17/2019 0337   MCHC 30.7 11/17/2019 0337   RDW 17.2 (H) 11/17/2019 0337   RDW 17.6 (H) 01/28/2018 1035   RDW 14.8 (H) 09/29/2011 1016   LYMPHSABS 1.6 10/14/2019 0529   LYMPHSABS 0.6 (L) 09/29/2011 1016   MONOABS 1.7 (H) 10/14/2019 0529   MONOABS 0.8 09/29/2011 1016   EOSABS 0.1 10/14/2019 0529   EOSABS 0.3 09/29/2011 1016   BASOSABS 0.1 10/14/2019 0529   BASOSABS 0.1 09/29/2011 1016    BMET    Component Value Date/Time   NA 131 (L) 11/16/2019 0322   NA 142 01/28/2018 1035   K 3.8 11/16/2019 0322   CL 95 (L) 11/16/2019 0322   CO2 21 (L) 11/16/2019 0322   GLUCOSE 119 (H) 11/16/2019 0322   BUN 25 (H) 11/16/2019 0322   BUN 19 01/28/2018 1035   BUN 14.7 07/18/2014 1142   CREATININE 1.40 (H) 11/16/2019 0322   CREATININE 1.0 07/18/2014 1142   CALCIUM 8.9 11/16/2019 0322   GFRNONAA 38 (L) 11/16/2019 0322   GFRAA 44 (L) 11/16/2019 0322    INR    Component Value Date/Time   INR 1.2 10/18/2019 0332     Intake/Output Summary (Last 24 hours) at 11/18/2019 0910 Last data filed at 11/18/2019 0600 Gross per 24 hour  Intake 111.57 ml  Output 675 ml  Net -563.43 ml     Assessment/Plan:  68 y.o. female pt who does not want further invasive care.  Hospice consulted and awaiting  placement at [facility].  Appreciate palliative care/hospice.  Dr. Donzetta Matters to see pt later today   Leontine Locket, PA-C [facility] [phone] 11/18/2019 9:10 AM  I have independently interviewed and examined patient and agree with PA assessment and plan above.   Drew Herman C. Donzetta Matters, MD [facility] [place] Office: [phone] Pager: [phone]

## 2019-11-18 NOTE — Plan of Care (Signed)
Continue to monitor

## 2019-11-18 NOTE — Discharge Summary (Signed)
Discharge Summary    Lauren [name] [birth year] 68 y.o. female  [id number]  Admission Date: 09/29/2019  Discharge Date: 11/18/2019  Physician: Thomes Lolling*  Admission Diagnosis: PAD (peripheral artery disease) ([place]) [I73.9]   HPI:   This is a 68 y.o. female with past medical history significant for hypertension, hyperlipidemia, insulin-dependent diabetes mellitus, severe COPD, cor pulmonale,andCADwho presents with chief complaintworsening pain R foot and great toe.Surgical history significant for right carotid endarterectomy in 2016 by Dr. Kellie [name]. Patient is also had left SFA and popliteal stenting by Dr. Donzetta [name] in July 2019. She describes over the past 2 months that she has had occasional rest pain of right foot which has been increasingly painful and more regular over the past 2 weeks. She has bilateral foot drop and admittedly does not walk anymore. She is however able to transfer from chair with some assist. 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              She underwent aortogram 09/15/2019 by Dr. Donzetta [name].  Findings:The aorta and iliac segments appear again diminutive she has significant bowel gas but the runoff is non-limited. The right lower extremity there appears to be near occlusion of the common femoral artery at the profunda femoris takeoff. The SFA is diminutive but there is no greater than 50% stenosis and dominant runoff is via the posterior tibial artery. Pullback gradient was approximately 40 mmHg across the common femoral artery.  Patient will need right common femoral endarterectomy.  Hospital Course:  The patient was admitted to the hospital and taken to the operating room on 09/29/2019 and underwent: 1.  Harvest right greater saphenous vein 2.  Right external iliac, common femoral, profunda femoral and SFA endarterectomies with vein patch angioplasty    She underwent sharp debridement of right groin multiple times as well as  right groin sartorius muscle rotational flap.  She was also taken back to the operating room multiple times for wound vac changes and debridements.    Over time, she had extensive abdominal wall breakdown.  Subsequently, a palliative care consult was obtained.   Per palliative care, A detailed discussion was had today regarding advanced directives.  Concepts specific to code status, artifical feeding and hydration, continued IV antibiotics and rehospitalization was had.  The difference between a aggressive medical intervention path  and a palliative comfort care path for this patient at this time was had.  Values and goals of care important to patient and family were attempted to be elicited.   Created space and opportunity for daughter to explore her thoughts and feelings regarding her mother's current medical situation.  She verbalizes an understanding of the poor prognosis and a sense of knowing that "my mother does not want this, she has been ready for some time now"  MOST form introduced completed to reflect full comfort  Natural trajectory and expectations at EOL were discussed.  Questions and concerns addressed.   Family encouraged to call with questions or concerns.    Emotional support offered  PMT will continue to support holistically.   SUMMARY OF RECOMMENDATIONS    Code Status/Advance Care Planning:  DNR   Focus of care is comfort, quality and dignity.    Symptom Management:   Pain-Dilaudid 0.5 mg IV every 3 hours as needed  Wean Nucynta -decrease 50 mg to every 12 hrs, re-evaluate for discontinue  Anxiety - Xanax 0.5 mg p.o./sublingual 3 times daily as needed  Palliative Prophylaxis:   Frequent Pain  Assessment, Oral Care and Palliative Wound Care  Additional Recommendations (Limitations, Scope, Preferences):  Full Comfort Care  Psycho-social/Spiritual:   Desire for further Chaplaincy support:yes  Additional Recommendations: Education on  Hospice  Prognosis:   < 2 weeks  Discharge Planning: Family is [facility] for [facility] [place]  On 11/18/2019, per palliative care, [facility])  [place] has a bed for Mrs. Lauren [name] today.  Consents being completed by daughter this am.  Will update TOC manager once complete so transportation can be arranged at that time.  Please ensure her wheelchair, glasses and brush are sent with her.  RN staff, please call report to [phone] at any time.  Room assigned at this time.  You may d/c any PIVs, please leave foley in.  Please fax d/c summary to [phone]  The remainder of the hospital course consisted of increasing mobilization and increasing intake of solids without difficulty.  CBC    Component Value Date/Time   WBC 15.3 (H) 11/17/2019 0337   RBC 3.42 (L) 11/17/2019 0337   HGB 8.9 (L) 11/17/2019 0337   HGB 11.2 01/28/2018 1035   HGB 12.0 09/29/2011 1016   HCT 29.0 (L) 11/17/2019 0337   HCT 34.4 01/28/2018 1035   HCT 35.3 09/29/2011 1016   PLT 441 (H) 11/17/2019 0337   PLT 384 (H) 01/28/2018 1035   MCV 84.8 11/17/2019 0337   MCV 88 01/28/2018 1035   MCV 88.0 09/29/2011 1016   MCH 26.0 11/17/2019 0337   MCHC 30.7 11/17/2019 0337   RDW 17.2 (H) 11/17/2019 0337   RDW 17.6 (H) 01/28/2018 1035   RDW 14.8 (H) 09/29/2011 1016   LYMPHSABS 1.6 10/14/2019 0529   LYMPHSABS 0.6 (L) 09/29/2011 1016   MONOABS 1.7 (H) 10/14/2019 0529   MONOABS 0.8 09/29/2011 1016   EOSABS 0.1 10/14/2019 0529   EOSABS 0.3 09/29/2011 1016   BASOSABS 0.1 10/14/2019 0529   BASOSABS 0.1 09/29/2011 1016    BMET    Component Value Date/Time   NA 131 (L) 11/16/2019 0322   NA 142 01/28/2018 1035   K 3.8 11/16/2019 0322   CL 95 (L) 11/16/2019 0322   CO2 21 (L) 11/16/2019 0322   GLUCOSE 119 (H) 11/16/2019 0322   BUN 25 (H) 11/16/2019 0322   BUN 19 01/28/2018 1035   BUN 14.7 07/18/2014 1142   CREATININE 1.40 (H) 11/16/2019 0322   CREATININE 1.0  07/18/2014 1142   CALCIUM 8.9 11/16/2019 0322   GFRNONAA 38 (L) 11/16/2019 0322   GFRAA 44 (L) 11/16/2019 0322     Discharge Diagnosis:  PAD (peripheral artery disease) (HCC) [I73.9]  Non healing wound  Secondary Diagnosis: Patient Active Problem List   Diagnosis Date Noted  . Pain, generalized   . DNR (do not resuscitate)   . Palliative care by specialist   . Pressure injury of skin 10/26/2019  . Femoral artery occlusion, right ([place]) 09/29/2019  . Atherosclerosis of right lower extremity with rest pain ([place]) 09/08/2019  . PAH (pulmonary artery hypertension) ([place]) 07/10/2019  . 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) 07/10/2019  . DVT, lower extremity, proximal, acute, left ([place]) 07/10/2019  . Hypercholesterolemia 07/10/2019  . Achilles tendon contracture, right   . SBO (small bowel obstruction) ([place]) 07/30/2018  . Acute lower UTI 07/30/2018  . Hypernatremia 07/30/2018  . Peripheral vascular disease due to secondary diabetes mellitus ([place]) 05/17/2018  . Foot drop, bilateral 05/17/2018  . Foot drop, left 02/11/2018  . DOE (dyspnea on exertion) 01/28/2018  .  DM (diabetes mellitus) type II uncontrolled, periph vascular disorder ([place]) 01/28/2018  . Diastolic dysfunction [id number]  . Abnormal liver function tests 11/14/2017  . 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 Acute encephalopathy 08/22/2017  . Acute urinary retention 08/22/2017  . AKI (acute kidney injury) ([place]) 08/22/2017  . PAD (peripheral artery disease) ([place]) 08/15/2017  . Cor pulmonale, chronic ([place]) 08/15/2017  . Chronic diastolic heart failure ([place]) 08/15/2017  . History of cigarette smoking 08/15/2017  . Multiple pulmonary nodules determined by computed tomography of lung 07/31/2017  . 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 Pressure ulcer of left heel, stage 2 ([place]) 02/04/2017  . Altered mental status   . Community acquired pneumonia   .  Encephalopathy   . Fall   . Weakness of both lower extremities   . Chronic pain   . Narcotic dependence ([place])   . Incontinence of feces   . Sepsis ([place]) 02/02/2017  . Carotid stenosis, bilateral 01/30/2017  . Aftercare following surgery of the circulatory system 01/30/2017  . Impingement syndrome of left shoulder 12/17/2016  . Coronary artery disease of native artery of native heart with stable angina pectoris ([place]) 10/07/2016  . Iron deficiency anemia 10/07/2016  . DDD (degenerative disc disease), lumbar 10/03/2016  . Weakness generalized 10/03/2016  . Intervertebral disc disease 10/03/2016  . Low back pain 10/03/2016  . Myelopathy ([place]) 10/03/2016  . Prolapsed lumbosacral intervertebral disc 10/03/2016  . Spondylisthesis 10/03/2016  . MRSA (methicillin resistant staph aureus) culture positive 10/10/2015  . Carotid stenosis 08/17/2015  . Preoperative cardiovascular examination 08/10/2015  . Tobacco abuse 08/10/2015  . 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 Acute osteomyelitis of spine ([place]) 08/28/2014  . Difficult or painful urination 08/28/2014  . H/O Spinal surgery 08/28/2014  . Elevated WBC count 08/28/2014  . Encounter for aftercare for long-term (current) use of antibiotics 08/28/2014  . N&V (nausea and vomiting) 08/28/2014  . Bladder retention 08/28/2014  . Infection of urinary tract 08/28/2014  . Ventral hernia 06/15/2013  . COPD GOLD III/IV 06/15/2013  . DJD (degenerative joint disease) of lumbar spine 06/15/2013  . Depression 06/15/2013  . Exertional angina ([place]) 06/15/2013  . Preoperative evaluation 06/15/2013  . Occlusion and stenosis of carotid artery without mention of cerebral infarction 03/02/2012  . Malignant neoplasm of corpus uteri, except isthmus ([place]) 01/19/2012  . Abnormal EKG 12/07/2011  . Essential hypertension, benign 12/07/2011  . Mixed hyperlipidemia 12/07/2011  . Carotid artery disease without cerebral infarction ([place]) 12/07/2011   Past Medical  History:  Diagnosis Date  . Adenomatous colon polyp   . Anemia   . Anxiety   . AVM (arteriovenous malformation)    cecum  . CAD (coronary artery disease)   . Carotid artery occlusion   . Cataract    bil cateracts removed  . CHF (congestive heart failure) ([place])   . COPD (chronic obstructive pulmonary disease) ([place])   . Cough   . DDD (degenerative disc disease), lumbar   . Depression   . Diabetes mellitus    Type II  . DVT (deep venous thrombosis) ([place])    RLE 05/03/19  . Elevated LFTs   . Endometrial ca [facility])    endometrial ca dx 4/11  . Foot drop   . GERD (gastroesophageal reflux disease)   . Gout 2015  . Hernia of flank   . Hydronephrosis   . Hyperlipidemia   . Hypertension   . Insomnia   . Neuropathy   . OAB (overactive bladder)   .  Osteoarthritis   . Peripheral neuropathy    following chemotherapy  . Peripheral vascular disease ([place])   . Pneumonia 07/2017  . Psoriasis   . Pulmonary nodule    since 2016  . Radiation 05/01/2011-06/11/11   5600 cGy 28 fxs/periaortic  . Stroke [facility]) 2015   tia- Patient denies  . Thyroid nodule   . TIA (transient ischemic attack) 2015  . 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 Uterine cancer ([place])   . Ventral hernia    postoperative since 2009     Allergies as of 11/18/2019      Reactions   Ambien [zolpidem Tartrate] Other (See Comments)   Per the pt, "it makes me go crazy"   Sulfa Antibiotics Rash   Ativan [lorazepam] Other (See Comments)   Patient has the opposite effect when taking medication    Elavil [amitriptyline Hcl] Other (See Comments)   Gastritis   Prozac [fluoxetine Hcl] Anxiety, Other (See Comments)   irritability      Medication List    STOP taking these medications   betamethasone dipropionate 0.05 % ointment Commonly known as: DIPROLENE   escitalopram 20 MG tablet Commonly known as: LEXAPRO   esomeprazole 40 MG capsule Commonly known as: NEXIUM   ferrous sulfate 325 (65  FE) MG tablet   folic acid [id number] MCG tablet Commonly known as: FOLVITE   furosemide 40 MG tablet Commonly known as: LASIX   insulin glargine 100 UNIT/ML injection Commonly known as: LANTUS   isosorbide mononitrate 30 MG 24 hr tablet Commonly known as: IMDUR   losartan 100 MG tablet Commonly known as: COZAAR   magnesium oxide 400 MG tablet Commonly known as: MAG-OX   Melatonin 10 MG Tabs   methotrexate 2.5 MG tablet   metoprolol succinate 50 MG 24 hr tablet Commonly known as: TOPROL-XL   multivitamin with minerals Tabs tablet   nitrofurantoin (macrocrystal-monohydrate) 100 MG capsule Commonly known as: MACROBID   nitroGLYCERIN 0.4 MG SL tablet Commonly known as: Nitrostat   nortriptyline 10 MG capsule Commonly known as: PAMELOR   potassium chloride 10 MEQ CR capsule Commonly known as: MICRO-K   RELION INSULIN SYRINGE 1ML/31G 31G X 5/16" 1 ML Misc Generic drug: Insulin Syringe-Needle U-100   rosuvastatin 5 MG tablet Commonly known as: CRESTOR   senna 8.6 MG tablet Commonly known as: SENOKOT   [place] 100-33 UNT-MCG/ML Sopn Generic drug: Insulin Glargine-Lixisenatide   vitamin B-12 1000 MCG tablet Commonly known as: CYANOCOBALAMIN   Vitamin D3 25 MCG (1000 UT) Caps     TAKE these medications   ALPRAZolam 0.5 MG tablet Commonly known as: XANAX Take 1 tablet (0.5 mg total) by mouth 3 (three) times daily as needed for anxiety.   amLODipine 5 MG tablet Commonly known as: NORVASC Take 1 tablet (5 mg total) by mouth daily.   Eliquis 5 MG Tabs tablet Generic drug: apixaban Take 5 mg by mouth 2 (two) times daily.   Fluticasone-Salmeterol 250-50 MCG/DOSE Aepb Commonly known as: ADVAIR Inhale 1 puff into the lungs 2 (two) times daily.   gabapentin 600 MG tablet Commonly known as: NEURONTIN Take 600-1,200 mg by mouth See admin instructions. Take 600 mg by mouth in the morning, afternoon and evening and 1200 mg at bedtime   Gerhardt's butt cream Crea  Apply 1 application topically as needed for irritation.   HYDROmorphone 1 MG/ML injection Commonly known as: DILAUDID Inject 0.5 mLs (0.5 mg total) into the vein every 3 (  three) hours as needed for moderate pain.   metaxalone 800 MG tablet Commonly known as: SKELAXIN Take 800 mg by mouth 3 (three) times daily. 0800, 1200, 1600   ondansetron 4 MG disintegrating tablet Commonly known as: ZOFRAN-ODT Take 4 mg by mouth every 8 (eight) hours as needed for nausea or vomiting.   oxyCODONE-acetaminophen 5-325 MG tablet Commonly known as: PERCOCET/ROXICET Take 1 tablet by mouth every 4 (four) hours as needed for moderate pain.   OxyCONTIN 15 mg 12 hr tablet Generic drug: oxyCODONE Take 1 tablet (15 mg total) by mouth every 12 (twelve) hours.   ProAir RespiClick [id number] (90 Base) MCG/ACT Aepb Generic drug: Albuterol Sulfate Inhale 2 puffs into the lungs every 6 (six) hours as needed (congestion, cough, wheezing).   tapentadol 50 MG tablet Commonly known as: Nucynta Take 1 tablet (50 mg total) by mouth every 6 (six) hours. 6 am, noon, 6pm  And midnight What changed: additional instructions   traMADol 50 MG tablet Commonly known as: ULTRAM Take 1 tablet (50 mg total) by mouth every 6 (six) hours.        Disposition: [place] for full comfort care   Patient's condition: poor    Leontine Locket, [place] [facility] [phone] 11/18/2019  2:27 PM

## 2019-11-18 NOTE — Progress Notes (Addendum)
Patient left via stretcher at this time to [facility]. Foley and left UA midline in place. Dilaudid 0.5 mg IV given for pain and comfort during transport. Daughter Veleta Miners notified that patient had left at this time. Glasses and hair brush sent with patient. Grey's husband is going to pick up patient's wheel chair and clothes tomorrow.   Patient unable to swallow so gabapentin, skelaxin, and tramadol wasted in steri cycle with Ara Kussmaul, RN.   Emelda Fear, RN

## 2019-11-18 NOTE — Progress Notes (Addendum)
Manufacturing [facility])  [place] has a bed for Lauren [name] today.  Consents being completed by daughter this am.  Will update TOC manager once complete so transportation can be arranged at that time.  Please ensure her wheelchair, glasses and brush are sent with her.  RN staff, please call report to [phone] at any time.  Room assigned at this time.  You may d/c any PIVs, please leave foley in.  Please fax d/c summary to [phone]  Venia Carbon RN, BSN, [place]  [phone]  [phone], consents are complete and transport can be arranged at any time.  TOC manager updated.

## 2019-11-18 NOTE — Progress Notes (Addendum)
Paged palliative care at this time to clarify medication orders for comfort care.

## 2019-11-18 NOTE — TOC Transition Note (Signed)
Transition of Care [facility]) - CM/SW Discharge Note   Patient Details  Name: Lauren [name] MRN: [id number] Date of Birth: 05/07/1951  Transition of Care [facility]) CM/SW Contact:  Vinie Sill, [place] Phone Number: 11/18/2019, 3:02 PM   Clinical Narrative:     Patient will DC to: [place] Date: 11/18/2019 Family Notified: Veleta Miners, daughter  Transport By: Corey Harold  RN, patient, and facility notified of DC. Discharge Summary sent to facility. RN given number for [phone]. Ambulance transport requested for patient.   Clinical Social Worker signing off. Thurmond Butts, MSW, [facility] Clinical Social Worker [phone]    Final next level of care: [place] Barriers to Discharge: Barriers Resolved   Patient Goals and CMS Choice Patient states their goals for this hospitalization and ongoing recovery are:: patient did not state, spouse stated he anticipated patient would go to rehab      Discharge Placement              Patient chooses bed at: [facility]) Patient to be transferred to facility by: [place] Name of family member notified: Jola Schmidt , daughter Patient and family notified of of transfer: 11/18/19  Discharge Plan and Services   Discharge Planning Services: CM Consult                                 Social Determinants of Health (SDOH) Interventions     Readmission Risk Interventions Readmission Risk Prevention Plan 08/07/2018  Transportation Screening Complete  Medication Review ([place]) Complete  PCP or Specialist appointment within 3-5 days of discharge Patient refused  [place] or Home Care Consult Complete  SW Recovery Care/Counseling Consult Complete  Palliative Care Screening Complete  Medication Reconcilation (Pharmacy) Complete  [place] Complete  Some recent data might be hidden

## 2019-11-18 NOTE — Progress Notes (Signed)
Report called to Colletta [place], Therapist, sports at [facility] at this time. Will continue to monitor.

## 2019-11-21 ENCOUNTER — Telehealth: Payer: Self-pay | Admitting: Vascular Surgery

## 2019-11-21 NOTE — Telephone Encounter (Signed)
Lauren [name] patient daughter called states that patient passed away 26-Nov-2019 we can reach her at [phone] if we need to.   Dr Donzetta Matters Patient Daughter said thank you so much for all you did.

## 2019-11-21 NOTE — Telephone Encounter (Signed)
Opened encounter in error  

## 2019-11-29 DEATH — deceased

## 2020-09-19 ENCOUNTER — Telehealth: Payer: Self-pay | Admitting: Cardiovascular Disease

## 2020-09-19 NOTE — Telephone Encounter (Signed)
Thanks for letting me know!

## 2020-09-19 NOTE — Telephone Encounter (Signed)
Called to schedule patient from recall and daughter answered and stated that the patient had passed in December 13, 2019
